# Patient Record
Sex: Female | Born: 1945
Health system: Southern US, Community
[De-identification: ages and names within clinical notes are randomized; demographics above are authoritative.]

## PROBLEM LIST (undated history)

## (undated) DIAGNOSIS — Z86010 Personal history of colonic polyps: Secondary | ICD-10-CM

## (undated) DIAGNOSIS — I517 Cardiomegaly: Secondary | ICD-10-CM

## (undated) DIAGNOSIS — I10 Essential (primary) hypertension: Secondary | ICD-10-CM

## (undated) DIAGNOSIS — D649 Anemia, unspecified: Secondary | ICD-10-CM

## (undated) DIAGNOSIS — I422 Other hypertrophic cardiomyopathy: Secondary | ICD-10-CM

## (undated) DIAGNOSIS — N3020 Other chronic cystitis without hematuria: Secondary | ICD-10-CM

## (undated) DIAGNOSIS — H269 Unspecified cataract: Secondary | ICD-10-CM

## (undated) DIAGNOSIS — K5792 Diverticulitis of intestine, part unspecified, without perforation or abscess without bleeding: Secondary | ICD-10-CM

## (undated) DIAGNOSIS — R011 Cardiac murmur, unspecified: Secondary | ICD-10-CM

## (undated) DIAGNOSIS — E785 Hyperlipidemia, unspecified: Secondary | ICD-10-CM

## (undated) DIAGNOSIS — Z5189 Encounter for other specified aftercare: Secondary | ICD-10-CM

## (undated) DIAGNOSIS — K219 Gastro-esophageal reflux disease without esophagitis: Secondary | ICD-10-CM

## (undated) HISTORY — PX: COLONOSCOPY: SHX174

## (undated) HISTORY — DX: Essential (primary) hypertension: I10

## (undated) HISTORY — DX: Hyperlipidemia, unspecified: E78.5

## (undated) HISTORY — DX: Diverticulitis of intestine, part unspecified, without perforation or abscess without bleeding: K57.92

## (undated) HISTORY — DX: Unspecified cataract: H26.9

## (undated) HISTORY — DX: Anemia, unspecified: D64.9

## (undated) HISTORY — PX: BREAST BIOPSY: SHX20

## (undated) HISTORY — DX: Other chronic cystitis without hematuria: N30.20

## (undated) HISTORY — DX: Personal history of colonic polyps: Z86.010

## (undated) HISTORY — PX: BREAST SURGERY: SHX581

## (undated) HISTORY — PX: BREAST EXCISIONAL BIOPSY: SUR124

## (undated) HISTORY — PX: CATARACT EXTRACTION: SUR2

## (undated) HISTORY — DX: Cardiac murmur, unspecified: R01.1

---

## 1998-01-17 ENCOUNTER — Ambulatory Visit (HOSPITAL_COMMUNITY): Admission: RE | Admit: 1998-01-17 | Discharge: 1998-01-17 | Payer: Self-pay | Admitting: Unknown Physician Specialty

## 1998-03-01 ENCOUNTER — Ambulatory Visit (HOSPITAL_COMMUNITY): Admission: RE | Admit: 1998-03-01 | Discharge: 1998-03-01 | Payer: Self-pay | Admitting: Gastroenterology

## 1999-02-05 ENCOUNTER — Encounter: Payer: Self-pay | Admitting: Unknown Physician Specialty

## 1999-02-05 ENCOUNTER — Ambulatory Visit (HOSPITAL_COMMUNITY): Admission: RE | Admit: 1999-02-05 | Discharge: 1999-02-05 | Payer: Self-pay | Admitting: Unknown Physician Specialty

## 2000-02-21 ENCOUNTER — Encounter: Payer: Self-pay | Admitting: Unknown Physician Specialty

## 2000-02-21 ENCOUNTER — Ambulatory Visit (HOSPITAL_COMMUNITY): Admission: RE | Admit: 2000-02-21 | Discharge: 2000-02-21 | Payer: Self-pay | Admitting: Unknown Physician Specialty

## 2001-03-09 ENCOUNTER — Encounter: Payer: Self-pay | Admitting: Unknown Physician Specialty

## 2001-03-09 ENCOUNTER — Ambulatory Visit (HOSPITAL_COMMUNITY): Admission: RE | Admit: 2001-03-09 | Discharge: 2001-03-09 | Payer: Self-pay | Admitting: Unknown Physician Specialty

## 2002-03-11 ENCOUNTER — Ambulatory Visit (HOSPITAL_COMMUNITY): Admission: RE | Admit: 2002-03-11 | Discharge: 2002-03-11 | Payer: Self-pay | Admitting: Obstetrics and Gynecology

## 2002-03-11 ENCOUNTER — Encounter: Payer: Self-pay | Admitting: Unknown Physician Specialty

## 2003-03-22 ENCOUNTER — Ambulatory Visit (HOSPITAL_COMMUNITY): Admission: RE | Admit: 2003-03-22 | Discharge: 2003-03-22 | Payer: Self-pay | Admitting: Unknown Physician Specialty

## 2003-05-03 ENCOUNTER — Other Ambulatory Visit: Admission: RE | Admit: 2003-05-03 | Discharge: 2003-05-03 | Payer: Self-pay | Admitting: Family Medicine

## 2004-03-22 ENCOUNTER — Ambulatory Visit (HOSPITAL_COMMUNITY): Admission: RE | Admit: 2004-03-22 | Discharge: 2004-03-22 | Payer: Self-pay | Admitting: Family Medicine

## 2005-04-12 ENCOUNTER — Ambulatory Visit (HOSPITAL_COMMUNITY): Admission: RE | Admit: 2005-04-12 | Discharge: 2005-04-12 | Payer: Self-pay | Admitting: Family Medicine

## 2006-05-08 ENCOUNTER — Ambulatory Visit (HOSPITAL_COMMUNITY): Admission: RE | Admit: 2006-05-08 | Discharge: 2006-05-08 | Payer: Self-pay

## 2007-05-11 ENCOUNTER — Ambulatory Visit (HOSPITAL_COMMUNITY): Admission: RE | Admit: 2007-05-11 | Discharge: 2007-05-11 | Payer: Self-pay | Admitting: Family Medicine

## 2008-05-26 ENCOUNTER — Ambulatory Visit (HOSPITAL_COMMUNITY): Admission: RE | Admit: 2008-05-26 | Discharge: 2008-05-26 | Payer: Self-pay | Admitting: Family Medicine

## 2009-04-12 ENCOUNTER — Ambulatory Visit (HOSPITAL_COMMUNITY): Admission: RE | Admit: 2009-04-12 | Discharge: 2009-04-12 | Payer: Self-pay | Admitting: Family Medicine

## 2009-12-08 ENCOUNTER — Ambulatory Visit (HOSPITAL_COMMUNITY): Admission: RE | Admit: 2009-12-08 | Discharge: 2009-12-08 | Payer: Self-pay | Admitting: Family Medicine

## 2011-01-01 ENCOUNTER — Other Ambulatory Visit: Payer: Self-pay | Admitting: Family Medicine

## 2011-01-01 DIAGNOSIS — Z1231 Encounter for screening mammogram for malignant neoplasm of breast: Secondary | ICD-10-CM

## 2011-01-18 ENCOUNTER — Ambulatory Visit (HOSPITAL_COMMUNITY)
Admission: RE | Admit: 2011-01-18 | Discharge: 2011-01-18 | Disposition: A | Discharge status: Home / Self Care | Payer: BC Managed Care – PPO | Source: Ambulatory Visit | Attending: Family Medicine | Admitting: Family Medicine

## 2011-01-18 DIAGNOSIS — Z1231 Encounter for screening mammogram for malignant neoplasm of breast: Secondary | ICD-10-CM

## 2011-10-09 ENCOUNTER — Encounter: Payer: Self-pay | Admitting: Internal Medicine

## 2011-12-05 LAB — LIPID PANEL: HDL: 44 mg/dL (ref 35–70)

## 2011-12-09 ENCOUNTER — Encounter: Payer: BC Managed Care – PPO | Admitting: Internal Medicine

## 2011-12-24 ENCOUNTER — Ambulatory Visit (AMBULATORY_SURGERY_CENTER): Payer: BC Managed Care – PPO

## 2011-12-24 VITALS — Ht 65.5 in | Wt 195.6 lb

## 2011-12-24 DIAGNOSIS — Z1211 Encounter for screening for malignant neoplasm of colon: Secondary | ICD-10-CM

## 2011-12-24 DIAGNOSIS — Z860100 Personal history of colon polyps, unspecified: Secondary | ICD-10-CM

## 2011-12-24 DIAGNOSIS — Z800 Family history of malignant neoplasm of digestive organs: Secondary | ICD-10-CM

## 2011-12-24 MED ORDER — NA SULFATE-K SULFATE-MG SULF 17.5-3.13-1.6 GM/177ML PO SOLN: 1.0000 | Freq: Once | ORAL | Status: DC

## 2011-12-25 ENCOUNTER — Encounter: Payer: Self-pay | Admitting: Internal Medicine

## 2012-01-07 ENCOUNTER — Encounter: Payer: Self-pay | Admitting: Internal Medicine

## 2012-01-07 ENCOUNTER — Ambulatory Visit (AMBULATORY_SURGERY_CENTER): Payer: BC Managed Care – PPO | Admitting: Internal Medicine

## 2012-01-07 VITALS — BP 145/87 | HR 68 | Temp 97.5°F | Resp 21 | Ht 65.0 in | Wt 195.0 lb

## 2012-01-07 DIAGNOSIS — Z860100 Personal history of colon polyps, unspecified: Secondary | ICD-10-CM

## 2012-01-07 DIAGNOSIS — Z1211 Encounter for screening for malignant neoplasm of colon: Secondary | ICD-10-CM

## 2012-01-07 DIAGNOSIS — D126 Benign neoplasm of colon, unspecified: Secondary | ICD-10-CM

## 2012-01-07 DIAGNOSIS — K5730 Diverticulosis of large intestine without perforation or abscess without bleeding: Secondary | ICD-10-CM

## 2012-01-07 HISTORY — DX: Personal history of colon polyps, unspecified: Z86.0100

## 2012-01-07 HISTORY — DX: Personal history of colonic polyps: Z86.010

## 2012-01-07 MED ORDER — SODIUM CHLORIDE 0.9 % IV SOLN: 500.0000 mL | INTRAVENOUS | Status: DC

## 2012-01-07 NOTE — Progress Notes (Signed)
No complaints noted in the recovery room. Maw   

## 2012-01-07 NOTE — Op Note (Signed)
[facility] [address] [place], [address]   COLONOSCOPY PROCEDURE REPORT  PATIENT: Stacey Reyes, Stacey Reyes  MR#: [id number] BIRTHDATE: 1946-03-15 , 65  yrs. old GENDER: Female ENDOSCOPIST: Iva Boop, MD, [facility] REFERRED XB:JYNWGN Christell Constant, M.D. PROCEDURE DATE:  01/07/2012 PROCEDURE:   Colonoscopy with biopsy and Colonoscopy with snare polypectomy ASA CLASS:   Class II INDICATIONS:screening and surveillance,personal history of colonic polyps and patient's personal history of adenomatous colon polyps.  MEDICATIONS: Propofol (Diprivan) 280 mg IV, MAC sedation, administered by CRNA, and These medications were titrated to patient response per physician's verbal order  DESCRIPTION OF PROCEDURE:   After the risks benefits and alternatives of the procedure were thoroughly explained, informed consent was obtained.  A digital rectal exam revealed no abnormalities of the rectum.   The LB CF-[id number]  endoscope was introduced through the anus and advanced to the cecum, which was identified by both the appendix and ileocecal valve. No adverse events experienced.   The quality of the prep was Suprep excellent The instrument was then slowly withdrawn as the colon was fully examined.      COLON FINDINGS: Two polypoid shaped sessile polyps ranging between 3-30mm in size were found at the cecum and in the sigmoid colon.  A polypectomy was performed with cold forceps and with a cold snare. The resection was complete and the polyp tissue was completely retrieved.   Severe diverticulosis was noted The finding was in the left colon.   Mild diverticulosis was noted The finding was in the right colon.   The colon mucosa was otherwise normal.  Retroflexed views revealed no abnormalities. The time to cecum=3 minutes 20 seconds.  Withdrawal time=9 minutes 54 seconds.  The scope was withdrawn and the procedure completed. COMPLICATIONS: There were no complications.  ENDOSCOPIC  IMPRESSION: 1.   Two sessile polyps ranging between 3-66mm in size were found at the cecum and in the sigmoid colon; polypectomy was performed with cold forceps and with a cold snare 2.   Severe diverticulosis was noted in the left colon 3.   Mild diverticulosis was noted in the right colon 4.   The colon mucosa was otherwise normal - excellent prep 5.   Personal history of adenomas 2009 and before  RECOMMENDATIONS: Timing of repeat colonoscopy will be determined by pathology findings. eSigned:  Iva Boop, MD, [facility] 01/07/2012 11:28 AM  cc: Rudi Heap, MD and The Patient

## 2012-01-07 NOTE — Patient Instructions (Addendum)
Two tiny polyps removed today. Diverticulosis seen again. Overall I think this is good news and expect I will recommend a repeat colonoscopy in 5 years in the letter I send.  Thank you for choosing me and [facility].  Iva Boop, MD, [facility]    YOU HAD AN ENDOSCOPIC PROCEDURE TODAY AT THE [facility]: Refer to the procedure report that was given to you for any specific questions about what was found during the examination.  If the procedure report does not answer your questions, please call your gastroenterologist to clarify.  If you requested that your care partner not be given the details of your procedure findings, then the procedure report has been included in a sealed envelope for you to review at your convenience later.  YOU SHOULD EXPECT: Some feelings of bloating in the abdomen. Passage of more gas than usual.  Walking can help get rid of the air that was put into your GI tract during the procedure and reduce the bloating. If you had a lower endoscopy (such as a colonoscopy or flexible sigmoidoscopy) you may notice spotting of blood in your stool or on the toilet paper. If you underwent a bowel prep for your procedure, then you may not have a normal bowel movement for a few days.  DIET: Your first meal following the procedure should be a light meal and then it is ok to progress to your normal diet.  A half-sandwich or bowl of soup is an example of a good first meal.  Heavy or fried foods are harder to digest and may make you feel nauseous or bloated.  Likewise meals heavy in dairy and vegetables can cause extra gas to form and this can also increase the bloating.  Drink plenty of fluids but you should avoid alcoholic beverages for 24 hours.  ACTIVITY: Your care partner should take you home directly after the procedure.  You should plan to take it easy, moving slowly for the rest of the day.  You can resume normal activity the day after the procedure however you  should NOT DRIVE or use heavy machinery for 24 hours (because of the sedation medicines used during the test).    SYMPTOMS TO REPORT IMMEDIATELY: A gastroenterologist can be reached at any hour.  During normal business hours, 8:30 AM to 5:00 PM Monday through Friday, call [phone].  After hours and on weekends, please call the GI answering service at [phone] who will take a message and have the physician on call contact you.   Following lower endoscopy (colonoscopy or flexible sigmoidoscopy):  Excessive amounts of blood in the stool  Significant tenderness or worsening of abdominal pains  Swelling of the abdomen that is new, acute  Fever of 100F or higher   FOLLOW UP: If any biopsies were taken you will be contacted by phone or by letter within the next 1-3 weeks.  Call your gastroenterologist if you have not heard about the biopsies in 3 weeks.  Our staff will call the home number listed on your records the next business day following your procedure to check on you and address any questions or concerns that you may have at that time regarding the information given to you following your procedure. This is a courtesy call and so if there is no answer at the home number and we have not heard from you through the emergency physician on call, we will assume that you have returned to your regular daily activities without incident.  SIGNATURES/CONFIDENTIALITY: You and/or your care partner have signed paperwork which will be entered into your electronic medical record.  These signatures attest to the fact that that the information above on your After Visit Summary has been reviewed and is understood.  Full responsibility of the confidentiality of this discharge information lies with you and/or your care-partner.

## 2012-01-08 ENCOUNTER — Telehealth: Payer: Self-pay

## 2012-01-08 NOTE — Telephone Encounter (Signed)
  Follow up Call-  Call back number 01/07/2012  Post procedure Call Back phone  # [phone]  Permission to leave phone message Yes     Patient questions:  Do you have a fever, pain , or abdominal swelling? no Pain Score  0 *  Have you tolerated food without any problems? yes  Have you been able to return to your normal activities? yes  Do you have any questions about your discharge instructions: Diet   no Medications  no Follow up visit  no  Do you have questions or concerns about your Care? no  Actions: * If pain score is 4 or above: No action needed, pain <4.

## 2012-01-13 ENCOUNTER — Other Ambulatory Visit: Payer: Self-pay | Admitting: Family Medicine

## 2012-01-13 DIAGNOSIS — Z1231 Encounter for screening mammogram for malignant neoplasm of breast: Secondary | ICD-10-CM

## 2012-01-14 ENCOUNTER — Encounter: Payer: Self-pay | Admitting: Internal Medicine

## 2012-01-14 NOTE — Progress Notes (Signed)
Quick Note:  2 diminutive adenomas Repeat colon 2018 ______

## 2012-01-29 ENCOUNTER — Ambulatory Visit (HOSPITAL_COMMUNITY)
Admission: RE | Admit: 2012-01-29 | Discharge: 2012-01-29 | Disposition: A | Discharge status: Home / Self Care | Payer: BC Managed Care – PPO | Source: Ambulatory Visit | Attending: Family Medicine | Admitting: Family Medicine

## 2012-01-29 DIAGNOSIS — Z1231 Encounter for screening mammogram for malignant neoplasm of breast: Secondary | ICD-10-CM | POA: Insufficient documentation

## 2012-06-01 ENCOUNTER — Other Ambulatory Visit: Payer: Self-pay | Admitting: *Deleted

## 2012-06-01 DIAGNOSIS — Z0000 Encounter for general adult medical examination without abnormal findings: Secondary | ICD-10-CM

## 2012-06-01 DIAGNOSIS — L84 Corns and callosities: Secondary | ICD-10-CM

## 2012-06-10 ENCOUNTER — Ambulatory Visit: Payer: Self-pay

## 2012-06-10 ENCOUNTER — Other Ambulatory Visit: Payer: Self-pay

## 2012-06-19 ENCOUNTER — Other Ambulatory Visit: Payer: Self-pay | Admitting: Physician Assistant

## 2012-06-19 NOTE — Telephone Encounter (Signed)
rx up front pt aware

## 2012-06-19 NOTE — Telephone Encounter (Signed)
Print mail order please. NTBS. LAST OFFICE VISIT 11/13.

## 2012-06-19 NOTE — Telephone Encounter (Signed)
RX ready for pick up 

## 2012-09-11 ENCOUNTER — Other Ambulatory Visit: Payer: Self-pay | Admitting: Nurse Practitioner

## 2012-09-14 NOTE — Telephone Encounter (Signed)
LAST OV 11/13. 

## 2012-09-15 NOTE — Telephone Encounter (Signed)
Medication, dosage, and pharmacy verified with pt. Pt does want med and [place] to order.

## 2012-10-26 ENCOUNTER — Ambulatory Visit (INDEPENDENT_AMBULATORY_CARE_PROVIDER_SITE_OTHER): Payer: BC Managed Care – PPO

## 2012-10-26 ENCOUNTER — Encounter: Payer: Self-pay | Admitting: Family Medicine

## 2012-10-26 ENCOUNTER — Ambulatory Visit (INDEPENDENT_AMBULATORY_CARE_PROVIDER_SITE_OTHER): Payer: BC Managed Care – PPO | Admitting: Family Medicine

## 2012-10-26 ENCOUNTER — Telehealth: Payer: Self-pay | Admitting: Nurse Practitioner

## 2012-10-26 VITALS — BP 125/83 | HR 77 | Temp 98.3°F | Wt 199.6 lb

## 2012-10-26 DIAGNOSIS — M25569 Pain in unspecified knee: Secondary | ICD-10-CM

## 2012-10-26 DIAGNOSIS — M25561 Pain in right knee: Secondary | ICD-10-CM

## 2012-10-26 NOTE — Patient Instructions (Signed)
Knee Pain  The knee is the complex joint between your thigh and your lower leg. It is made up of bones, tendons, ligaments, and cartilage. The bones that make up the knee are:   The femur in the thigh.   The tibia and fibula in the lower leg.   The patella or kneecap riding in the groove on the lower femur.  CAUSES   Knee pain is a common complaint with many causes. A few of these causes are:   Injury, such as:   A ruptured ligament or tendon injury.   Torn cartilage.   Medical conditions, such as:   Gout   Arthritis   Infections   Overuse, over training or overdoing a physical activity.  Knee pain can be minor or severe. Knee pain can accompany debilitating injury. Minor knee problems often respond well to self-care measures or get well on their own. More serious injuries may need medical intervention or even surgery.  SYMPTOMS  The knee is complex. Symptoms of knee problems can vary widely. Some of the problems are:   Pain with movement and weight bearing.   Swelling and tenderness.   Buckling of the knee.   Inability to straighten or extend your knee.   Your knee locks and you cannot straighten it.   Warmth and redness with pain and fever.   Deformity or dislocation of the kneecap.  DIAGNOSIS   Determining what is wrong may be very straight forward such as when there is an injury. It can also be challenging because of the complexity of the knee. Tests to make a diagnosis may include:   Your caregiver taking a history and doing a physical exam.   Routine X-rays can be used to rule out other problems. X-rays will not reveal a cartilage tear. Some injuries of the knee can be diagnosed by:   Arthroscopy a surgical technique by which a small video camera is inserted through tiny incisions on the sides of the knee. This procedure is used to examine and repair internal knee joint problems. Tiny instruments can be used during arthroscopy to repair the torn knee cartilage (meniscus).   Arthrography  is a radiology technique. A contrast liquid is directly injected into the knee joint. Internal structures of the knee joint then become visible on X-ray film.   An MRI scan is a non x-ray radiology procedure in which magnetic fields and a computer produce two- or three-dimensional images of the inside of the knee. Cartilage tears are often visible using an MRI scanner. MRI scans have largely replaced arthrography in diagnosing cartilage tears of the knee.   Blood work.   Examination of the fluid that helps to lubricate the knee joint (synovial fluid). This is done by taking a sample out using a needle and a syringe.  TREATMENT  The treatment of knee problems depends on the cause. Some of these treatments are:   Depending on the injury, proper casting, splinting, surgery or physical therapy care will be needed.   Give yourself adequate recovery time. Do not overuse your joints. If you begin to get sore during workout routines, back off. Slow down or do fewer repetitions.   For repetitive activities such as cycling or running, maintain your strength and nutrition.   Alternate muscle groups. For example if you are a weight lifter, work the upper body on one day and the lower body the next.   Either tight or weak muscles do not give the proper support for your   knee. Tight or weak muscles do not absorb the stress placed on the knee joint. Keep the muscles surrounding the knee strong.   Take care of mechanical problems.   If you have flat feet, orthotics or special shoes may help. See your caregiver if you need help.   Arch supports, sometimes with wedges on the inner or outer aspect of the heel, can help. These can shift pressure away from the side of the knee most bothered by osteoarthritis.   A brace called an "unloader" brace also may be used to help ease the pressure on the most arthritic side of the knee.   If your caregiver has prescribed crutches, braces, wraps or ice, use as directed. The acronym for  this is PRICE. This means protection, rest, ice, compression and elevation.   Nonsteroidal anti-inflammatory drugs (NSAID's), can help relieve pain. But if taken immediately after an injury, they may actually increase swelling. Take NSAID's with food in your stomach. Stop them if you develop stomach problems. Do not take these if you have a history of ulcers, stomach pain or bleeding from the bowel. Do not take without your caregiver's approval if you have problems with fluid retention, heart failure, or kidney problems.   For ongoing knee problems, physical therapy may be helpful.   Glucosamine and chondroitin are over-the-counter dietary supplements. Both may help relieve the pain of osteoarthritis in the knee. These medicines are different from the usual anti-inflammatory drugs. Glucosamine may decrease the rate of cartilage destruction.   Injections of a corticosteroid drug into your knee joint may help reduce the symptoms of an arthritis flare-up. They may provide pain relief that lasts a few months. You may have to wait a few months between injections. The injections do have a small increased risk of infection, water retention and elevated blood sugar levels.   Hyaluronic acid injected into damaged joints may ease pain and provide lubrication. These injections may work by reducing inflammation. A series of shots may give relief for as long as 6 months.   Topical painkillers. Applying certain ointments to your skin may help relieve the pain and stiffness of osteoarthritis. Ask your pharmacist for suggestions. Many over the-counter products are approved for temporary relief of arthritis pain.   In some countries, doctors often prescribe topical NSAID's for relief of chronic conditions such as arthritis and tendinitis. A review of treatment with NSAID creams found that they worked as well as oral medications but without the serious side effects.  PREVENTION   Maintain a healthy weight. Extra pounds put  more strain on your joints.   Get strong, stay limber. Weak muscles are a common cause of knee injuries. Stretching is important. Include flexibility exercises in your workouts.   Be smart about exercise. If you have osteoarthritis, chronic knee pain or recurring injuries, you may need to change the way you exercise. This does not mean you have to stop being active. If your knees ache after jogging or playing basketball, consider switching to swimming, water aerobics or other low-impact activities, at least for a few days a week. Sometimes limiting high-impact activities will provide relief.   Make sure your shoes fit well. Choose footwear that is right for your sport.   Protect your knees. Use the proper gear for knee-sensitive activities. Use kneepads when playing volleyball or laying carpet. Buckle your seat belt every time you drive. Most shattered kneecaps occur in car accidents.   Rest when you are tired.  SEEK MEDICAL CARE IF:     You have knee pain that is continual and does not seem to be getting better.   SEEK IMMEDIATE MEDICAL CARE IF:   Your knee joint feels hot to the touch and you have a high fever.  MAKE SURE YOU:    Understand these instructions.   Will watch your condition.   Will get help right away if you are not doing well or get worse.  Document Released: 01/06/2007 Document Revised: 06/03/2011 Document Reviewed: 01/06/2007  [employer] Patient Information 2014 [employer].

## 2012-10-26 NOTE — Telephone Encounter (Signed)
appt made

## 2012-10-26 NOTE — Progress Notes (Signed)
  Subjective:    Patient ID: Stacey Reyes, female    DOB: 1945-03-26, 67 y.o.   MRN: [id number]  HPI This 67 y.o. female presents for evaluation of right knee discomfort.  She states she does A lot of dancing and she has hurt her knee.  She describes pain and discomfort in her pre- Patellar region with some soreness in her quadriceps muscles.   She states it hurts to walk Up stairs or go down stairs..   Review of Systems    No chest pain, SOB, HA, dizziness, vision change, N/V, diarrhea, constipation, dysuria, urinary urgency or frequency, myalgias, arthralgias or rash.  Objective:   Physical Exam Vital signs noted  Well developed well nourished female.  HEENT - Head atraumatic Normocephalic                Throat - oropharanx wnl Respiratory - Lungs CTA bilateral Extremities - No edema. Neuro - Grossly intact. MS - Pre tibial tenderness, from right knee, negative drawer and varus/valgus.   Xray - Preliminary read normal right knee    Assessment & Plan:  Right knee pain - Plan: DG Knee 1-2 Views Right Aleve 200mg  3po tid x one week and if not better follow up.

## 2012-10-31 ENCOUNTER — Ambulatory Visit (INDEPENDENT_AMBULATORY_CARE_PROVIDER_SITE_OTHER): Payer: BC Managed Care – PPO | Admitting: General Practice

## 2012-10-31 VITALS — BP 159/88 | HR 94 | Temp 99.5°F | Ht 66.0 in | Wt 198.0 lb

## 2012-10-31 DIAGNOSIS — J029 Acute pharyngitis, unspecified: Secondary | ICD-10-CM

## 2012-10-31 MED ORDER — AMOXICILLIN 500 MG PO CAPS: 500.0000 mg | ORAL_CAPSULE | Freq: Two times a day (BID) | ORAL | Status: DC

## 2012-10-31 NOTE — Progress Notes (Signed)
  Subjective:    Patient ID: Stacey Reyes, female    DOB: 09/07/1945, 67 y.o.   MRN: [id number]  Sore Throat  This is a new problem. The current episode started yesterday. The problem has been gradually worsening. The pain is worse on the left side. There has been no fever. The pain is at a severity of 5/10. Associated symptoms include ear pain. Pertinent negatives include no abdominal pain, congestion, coughing, diarrhea, headaches, plugged ear sensation, neck pain or shortness of breath. She has tried NSAIDs for the symptoms. The treatment provided no relief.  Otalgia  There is pain in the left ear. This is a new problem. The current episode started yesterday. The problem occurs every few hours. The problem has been gradually worsening. There has been no fever. The pain is at a severity of 4/10. Associated symptoms include a sore throat. Pertinent negatives include no abdominal pain, coughing, diarrhea, headaches or neck pain. She has tried nothing for the symptoms. There is no history of a chronic ear infection, hearing loss or a tympanostomy tube.      Review of Systems  Constitutional: Negative for fever and chills.  HENT: Positive for ear pain and sore throat. Negative for congestion, neck pain and postnasal drip.   Respiratory: Negative for cough and shortness of breath.   Cardiovascular: Negative for chest pain and palpitations.  Gastrointestinal: Negative for abdominal pain and diarrhea.  Neurological: Negative for dizziness, weakness and headaches.       Objective:   Physical Exam  Constitutional: She is oriented to person, place, and time. She appears well-developed and well-nourished.  HENT:  Head: Normocephalic and atraumatic.  Eyes: Conjunctivae and EOM are normal. Pupils are equal, round, and reactive to light.  Neck: Normal range of motion. Neck supple. No thyromegaly present.  Cardiovascular: Normal rate, regular rhythm and normal heart sounds.   Pulmonary/Chest:  Effort normal and breath sounds normal. No respiratory distress. She exhibits no tenderness.  Lymphadenopathy:    She has no cervical adenopathy.  Neurological: She is alert and oriented to person, place, and time.  Skin: Skin is warm and dry.  Psychiatric: She has a normal mood and affect.          Assessment & Plan:  1. Sore throat - amoxicillin (AMOXIL) 500 MG capsule; Take 1 capsule (500 mg total) by mouth 2 (two) times daily.  Dispense: 20 capsule; Refill: 0 -Increase fluid intake Motrin or tylenol OTC OTC decongestant Throat lozenges if help New toothbrush in 3 days Proper handwashing Patient verbalized understanding Coralie Keens, FNP-C

## 2012-10-31 NOTE — Patient Instructions (Addendum)
Sore Throat A sore throat is pain, burning, irritation, or scratchiness of the throat. There is often pain or tenderness when swallowing or talking. A sore throat may be accompanied by other symptoms, such as coughing, sneezing, fever, and swollen neck glands. A sore throat is often the first sign of another sickness, such as a cold, flu, strep throat, or mononucleosis (commonly known as mono). Most sore throats go away without medical treatment. CAUSES  The most common causes of a sore throat include:  A viral infection, such as a cold, flu, or mono.  A bacterial infection, such as strep throat, tonsillitis, or whooping cough.  Seasonal allergies.  Dryness in the air.  Irritants, such as smoke or pollution.  Gastroesophageal reflux disease (GERD). HOME CARE INSTRUCTIONS   Only take over-the-counter medicines as directed by your caregiver.  Drink enough fluids to keep your urine clear or pale yellow.  Rest as needed.  Try using throat sprays, lozenges, or sucking on hard candy to ease any pain (if older than 4 years or as directed).  Sip warm liquids, such as broth, herbal tea, or warm water with honey to relieve pain temporarily. You may also eat or drink cold or frozen liquids such as frozen ice pops.  Gargle with salt water (mix 1 tsp salt with 8 oz of water).  Do not smoke and avoid secondhand smoke.  Put a cool-mist humidifier in your bedroom at night to moisten the air. You can also turn on a hot shower and sit in the bathroom with the door closed for 5 10 minutes. SEEK IMMEDIATE MEDICAL CARE IF:  You have difficulty breathing.  You are unable to swallow fluids, soft foods, or your saliva.  You have increased swelling in the throat.  Your sore throat does not get better in 7 days.  You have nausea and vomiting.  You have a fever or persistent symptoms for more than 2 3 days.  You have a fever and your symptoms suddenly get worse. MAKE SURE YOU:   Understand  these instructions.  Will watch your condition.  Will get help right away if you are not doing well or get worse. Document Released: 04/18/2004 Document Revised: 02/26/2012 Document Reviewed: 11/17/2011 Exit[employer] Patient Information 2014 [employer].  

## 2012-11-30 ENCOUNTER — Other Ambulatory Visit: Payer: Self-pay | Admitting: Family Medicine

## 2013-01-12 ENCOUNTER — Telehealth: Payer: Self-pay | Admitting: Nurse Practitioner

## 2013-01-14 NOTE — Telephone Encounter (Signed)
Patient aware.

## 2013-01-28 ENCOUNTER — Other Ambulatory Visit: Payer: Self-pay

## 2013-04-20 ENCOUNTER — Telehealth: Payer: Self-pay | Admitting: Nurse Practitioner

## 2013-04-20 NOTE — Telephone Encounter (Signed)
Appt given for tomorrow per patient request

## 2013-04-21 ENCOUNTER — Ambulatory Visit (INDEPENDENT_AMBULATORY_CARE_PROVIDER_SITE_OTHER): Payer: BC Managed Care – PPO | Admitting: General Practice

## 2013-04-21 ENCOUNTER — Encounter: Payer: Self-pay | Admitting: General Practice

## 2013-04-21 VITALS — BP 150/92 | HR 81 | Temp 98.9°F | Ht 66.0 in | Wt 200.0 lb

## 2013-04-21 DIAGNOSIS — R05 Cough: Secondary | ICD-10-CM

## 2013-04-21 DIAGNOSIS — R059 Cough, unspecified: Secondary | ICD-10-CM

## 2013-04-21 MED ORDER — PREDNISONE (PAK) 10 MG PO TABS: ORAL_TABLET | ORAL | Status: DC

## 2013-04-21 MED ORDER — GUAIFENESIN-CODEINE 100-10 MG/5ML PO SYRP: 5.0000 mL | ORAL_SOLUTION | Freq: Two times a day (BID) | ORAL | Status: DC | PRN

## 2013-04-21 MED ORDER — METHYLPREDNISOLONE ACETATE 80 MG/ML IJ SUSP
80.0000 mg | Freq: Once | INTRAMUSCULAR | Status: DC
  Administered 2013-04-21: 80 mg via INTRAMUSCULAR

## 2013-04-21 NOTE — Progress Notes (Signed)
   Subjective:    Patient ID: Stacey Reyes, female    DOB: 05/25/1945, 68 y.o.   MRN: [id number]  Cough This is a new problem. The current episode started in the past 7 days. The problem has been gradually worsening. The problem occurs every few minutes. The cough is non-productive. Associated symptoms include nasal congestion. Pertinent negatives include no chest pain, chills, fever, postnasal drip, rhinorrhea, sore throat, shortness of breath or wheezing. The symptoms are aggravated by lying down. She has tried OTC cough suppressant for the symptoms. There is no history of asthma, bronchitis or pneumonia.  Patient reports tessalon capsules haven't been effective in the past.     Review of Systems  Constitutional: Negative for fever and chills.  HENT: Negative for congestion, postnasal drip, rhinorrhea, sinus pressure and sore throat.   Respiratory: Positive for cough. Negative for chest tightness, shortness of breath and wheezing.   Cardiovascular: Negative for chest pain and palpitations.       Objective:   Physical Exam  Constitutional: She is oriented to person, place, and time. She appears well-developed and well-nourished.  Cardiovascular: Normal rate, regular rhythm and normal heart sounds.   Pulmonary/Chest: Effort normal and breath sounds normal. No respiratory distress. She exhibits no tenderness.  Neurological: She is alert and oriented to person, place, and time.  Skin: Skin is warm and dry.  Psychiatric: She has a normal mood and affect.          Assessment & Plan:  1. Cough -depo medrol 80mg  injection - guaiFENesin-codeine (CHERATUSSIN AC) 100-10 MG/5ML syrup; Take 5 mLs by mouth 2 (two) times daily as needed for cough.  Dispense: 60 mL; Refill: 0 -avoid irritants -RTO if symptoms worsen or unresolved (2 weeks) -will take chest xray -Patient verbalized understanding Erby Pian, FNP-C

## 2013-04-21 NOTE — Patient Instructions (Signed)

## 2013-04-27 ENCOUNTER — Ambulatory Visit: Payer: BC Managed Care – PPO | Admitting: Family Medicine

## 2013-04-27 VITALS — BP 131/77 | HR 98 | Temp 99.1°F | Ht 66.0 in | Wt 199.0 lb

## 2013-04-27 DIAGNOSIS — R059 Cough, unspecified: Secondary | ICD-10-CM

## 2013-04-27 DIAGNOSIS — J209 Acute bronchitis, unspecified: Secondary | ICD-10-CM

## 2013-04-27 DIAGNOSIS — R05 Cough: Secondary | ICD-10-CM

## 2013-04-27 MED ORDER — AZITHROMYCIN 250 MG PO TABS: ORAL_TABLET | ORAL | Status: DC

## 2013-04-27 MED ORDER — GUAIFENESIN-CODEINE 100-10 MG/5ML PO SYRP: 5.0000 mL | ORAL_SOLUTION | Freq: Four times a day (QID) | ORAL | Status: DC | PRN

## 2013-04-27 MED ORDER — PREDNISONE 20 MG PO TABS: 20.0000 mg | ORAL_TABLET | Freq: Every day | ORAL | Status: DC

## 2013-04-27 MED ORDER — LEVALBUTEROL HCL 1.25 MG/0.5ML IN NEBU: 1.2500 mg | INHALATION_SOLUTION | Freq: Once | RESPIRATORY_TRACT | Status: DC

## 2013-04-27 NOTE — Progress Notes (Signed)
   Subjective:    Patient ID: [name], female    DOB: 09/08/1945, 68 y.o.   MRN: [id number]  HPI This 68 y.o. female presents for evaluation of cough and bronchitis sx's for over a week.   Review of Systems C/o cough No chest pain, SOB, HA, dizziness, vision change, N/V, diarrhea, constipation, dysuria, urinary urgency or frequency, myalgias, arthralgias or rash.     Objective:   Physical Exam Vital signs noted  Well developed well nourished female.  HEENT - Head atraumatic Normocephalic                Eyes - PERRLA, Conjuctiva - clear Sclera- Clear EOMI                Ears - EAC's Wnl TM's Wnl Gross Hearing WNL                Throat - oropharanx wnl Respiratory - Lungs with expiratory wheezes Cardiac - RRR S1 and S2 without murmur GI - Abdomen soft Nontender and bowel sounds active x 4        Assessment & Plan:  Cough - Plan: guaiFENesin-codeine (CHERATUSSIN AC) 100-10 MG/5ML syrup  Acute bronchitis - Plan: guaiFENesin-codeine (CHERATUSSIN AC) 100-10 MG/5ML syrup, azithromycin (ZITHROMAX) 250 MG tablet, predniSONE (DELTASONE) 20 MG tablet, levalbuterol (XOPENEX) nebulizer solution 1.25 mg  Push po fluids, rest, tylenol and motrin otc prn as directed for fever, arthralgias, and myalgias.  Follow up prn if sx's continue or persist.  Lysbeth Penner FNP

## 2013-05-20 ENCOUNTER — Other Ambulatory Visit: Payer: Self-pay | Admitting: General Practice

## 2013-05-21 ENCOUNTER — Telehealth: Payer: Self-pay | Admitting: *Deleted

## 2013-05-21 NOTE — Telephone Encounter (Signed)
Script sent in and left a vm saying patient needs an appointment to be see and have labwork.

## 2013-05-21 NOTE — Telephone Encounter (Signed)
ntbs for follow up and lab work

## 2013-07-01 ENCOUNTER — Other Ambulatory Visit: Payer: Self-pay | Admitting: Family Medicine

## 2013-07-01 DIAGNOSIS — Z1231 Encounter for screening mammogram for malignant neoplasm of breast: Secondary | ICD-10-CM

## 2013-07-02 ENCOUNTER — Ambulatory Visit (HOSPITAL_COMMUNITY)
Admission: RE | Admit: 2013-07-02 | Discharge: 2013-07-02 | Disposition: A | Discharge status: Home / Self Care | Payer: BC Managed Care – PPO | Source: Ambulatory Visit | Attending: Family Medicine | Admitting: Family Medicine

## 2013-07-02 DIAGNOSIS — Z1231 Encounter for screening mammogram for malignant neoplasm of breast: Secondary | ICD-10-CM

## 2013-08-06 ENCOUNTER — Other Ambulatory Visit: Payer: Self-pay | Admitting: Nurse Practitioner

## 2013-08-09 NOTE — Telephone Encounter (Signed)
Patient NTBS for follow up and lab work  

## 2013-08-09 NOTE — Telephone Encounter (Signed)
Last labs 2013 in epic

## 2013-08-30 ENCOUNTER — Ambulatory Visit (INDEPENDENT_AMBULATORY_CARE_PROVIDER_SITE_OTHER): Payer: BC Managed Care – PPO | Admitting: Family Medicine

## 2013-08-30 ENCOUNTER — Encounter: Payer: Self-pay | Admitting: Family Medicine

## 2013-08-30 VITALS — BP 128/69 | HR 88 | Temp 99.4°F | Ht 66.0 in | Wt 198.8 lb

## 2013-08-30 DIAGNOSIS — K5732 Diverticulitis of large intestine without perforation or abscess without bleeding: Secondary | ICD-10-CM

## 2013-08-30 DIAGNOSIS — K5792 Diverticulitis of intestine, part unspecified, without perforation or abscess without bleeding: Secondary | ICD-10-CM

## 2013-08-30 MED ORDER — CIPROFLOXACIN HCL 500 MG PO TABS: 500.0000 mg | ORAL_TABLET | Freq: Two times a day (BID) | ORAL | Status: DC

## 2013-08-30 MED ORDER — METRONIDAZOLE 500 MG PO TABS: 500.0000 mg | ORAL_TABLET | Freq: Three times a day (TID) | ORAL | Status: DC

## 2013-08-30 NOTE — Progress Notes (Signed)
   Subjective:    Patient ID: Stacey Reyes, female    DOB: May 23, 1945, 68 y.o.   MRN: [id number]  HPI This 68 y.o. female presents for evaluation of LLQ abdominal pain for 2 days.  She states she ate  Some strawberry jam and she has hx of diverticulosis.   Review of Systems C/o abdominal pain   No chest pain, SOB, HA, dizziness, vision change, N/V, diarrhea, constipation, dysuria, urinary urgency or frequency, myalgias, arthralgias or rash.  Objective:   Physical Exam  Vital signs noted  Well developed well nourished female.  HEENT - Head atraumatic Normocephalic                Eyes - PERRLA, Conjuctiva - clear Sclera- Clear EOMI Respiratory - Lungs CTA bilateral Cardiac - RRR S1 and S2 without murmur GI - Abdomen soft tender LLQ without guarding BS active x 4      Assessment & Plan:  Diverticulitis - Plan: metroNIDAZOLE (FLAGYL) 500 MG tablet, ciprofloxacin (CIPRO) 500 MG tablet, DISCONTINUED: metroNIDAZOLE (FLAGYL) 500 MG tablet, DISCONTINUED: ciprofloxacin (CIPRO) 500 MG tablet  Lysbeth Penner FNP

## 2013-11-01 ENCOUNTER — Other Ambulatory Visit: Payer: Self-pay | Admitting: Nurse Practitioner

## 2013-11-02 NOTE — Telephone Encounter (Signed)
Last ov 08/30/13. I did confirm with pt that she wants RX sent to [facility].

## 2014-01-25 ENCOUNTER — Other Ambulatory Visit: Payer: Self-pay

## 2014-01-25 MED ORDER — LISINOPRIL-HYDROCHLOROTHIAZIDE 20-12.5 MG PO TABS: ORAL_TABLET | ORAL | Status: DC

## 2014-04-13 ENCOUNTER — Other Ambulatory Visit: Payer: Self-pay | Admitting: Family Medicine

## 2014-08-24 ENCOUNTER — Other Ambulatory Visit: Payer: Self-pay | Admitting: Family Medicine

## 2014-10-22 ENCOUNTER — Ambulatory Visit (INDEPENDENT_AMBULATORY_CARE_PROVIDER_SITE_OTHER): Payer: BLUE CROSS/BLUE SHIELD | Admitting: Family Medicine

## 2014-10-22 VITALS — BP 141/85 | HR 76 | Temp 98.4°F | Ht 66.0 in | Wt 202.0 lb

## 2014-10-22 DIAGNOSIS — R05 Cough: Secondary | ICD-10-CM

## 2014-10-22 DIAGNOSIS — R059 Cough, unspecified: Secondary | ICD-10-CM

## 2014-10-22 DIAGNOSIS — J200 Acute bronchitis due to Mycoplasma pneumoniae: Secondary | ICD-10-CM

## 2014-10-22 MED ORDER — LEVOFLOXACIN 500 MG PO TABS: 500.0000 mg | ORAL_TABLET | Freq: Every day | ORAL | Status: DC

## 2014-10-22 MED ORDER — GUAIFENESIN-CODEINE 100-10 MG/5ML PO SYRP: 5.0000 mL | ORAL_SOLUTION | Freq: Four times a day (QID) | ORAL | Status: DC | PRN

## 2014-10-22 NOTE — Progress Notes (Signed)
Subjective:  Patient ID: Stacey Reyes, female    DOB: May 03, 1945  Age: 69 y.o. MRN: [id number]  CC: Cough   HPI Stacey Reyes presents for  Onset 1 week ago. Worsening. OTC cough meds ineffective. Some nasal congestion. ST is minimal. No fever. No  Dyspnea. Location in chest, anteriorly - feels tight. History Stacey Reyes has a past medical history of Hypertension; Hyperlipidemia; Personal history of colonic polyps-adenomas (01/07/2012); Chronic cystitis; and Insomnia.   She has past surgical history that includes Breast surgery and Colonoscopy (multiple).   Her family history includes Breast cancer in her sister; Colon cancer (age of onset: 79) in her mother.She reports that she quit smoking about 29 years ago. She has never used smokeless tobacco. She reports that she does not drink alcohol or use illicit drugs.  Outpatient Prescriptions Prior to Visit  Medication Sig Dispense Refill  . aspirin 81 MG tablet Take 81 mg by mouth daily.    . Cholecalciferol (VITAMIN D-3) 1000 UNITS CAPS Take by mouth daily.    . fish oil-omega-3 fatty acids 1000 MG capsule Take 1 g by mouth daily.    Marland Kitchen lisinopril-hydrochlorothiazide (PRINZIDE,ZESTORETIC) 20-12.5 MG per tablet TAKE 1 TABLET DAILY 30 tablet 0  . simvastatin (ZOCOR) 40 MG tablet Take 40 mg by mouth every evening.    . vitamin E 400 UNIT capsule Take 400 Units by mouth. Take 2 daily    . ciprofloxacin (CIPRO) 500 MG tablet Take 1 tablet (500 mg total) by mouth 2 (two) times daily. 20 tablet 0  . metroNIDAZOLE (FLAGYL) 500 MG tablet Take 1 tablet (500 mg total) by mouth 3 (three) times daily. 21 tablet 0   Facility-Administered Medications Prior to Visit  Medication Dose Route Frequency Provider Last Rate Last Dose  . levalbuterol (XOPENEX) nebulizer solution 1.25 mg  1.25 mg Nebulization Once Lysbeth Penner, FNP        ROS Review of Systems  Constitutional: Negative for fever, chills, activity change and appetite change.  HENT:  Positive for congestion, postnasal drip, rhinorrhea and sinus pressure. Negative for ear discharge, ear pain, hearing loss, nosebleeds, sneezing and trouble swallowing.   Respiratory: Positive for cough and chest tightness. Negative for shortness of breath.   Cardiovascular: Negative for chest pain and palpitations.  Skin: Negative for rash.    Objective:  BP 141/85 mmHg  Pulse 76  Temp(Src) 98.4 F (36.9 C) (Oral)  Ht 5\' 6"  (1.676 m)  Wt 202 lb (91.627 kg)  BMI 32.62 kg/m2  BP Readings from Last 3 Encounters:  10/22/14 141/85  08/30/13 128/69  04/27/13 131/77    Wt Readings from Last 3 Encounters:  10/22/14 202 lb (91.627 kg)  08/30/13 198 lb 12.8 oz (90.175 kg)  04/27/13 199 lb (90.266 kg)     Physical Exam  Constitutional: She appears well-developed and well-nourished.  HENT:  Head: Normocephalic and atraumatic.  Right Ear: Tympanic membrane and external ear normal. No decreased hearing is noted.  Left Ear: Tympanic membrane and external ear normal. No decreased hearing is noted.  Nose: Mucosal edema present. Right sinus exhibits no frontal sinus tenderness. Left sinus exhibits no frontal sinus tenderness.  Mouth/Throat: No oropharyngeal exudate or posterior oropharyngeal erythema.  Neck: No Brudzinski's sign noted.  Pulmonary/Chest: Breath sounds normal. No respiratory distress.  Lymphadenopathy:       Head (right side): No preauricular adenopathy present.       Head (left side): No preauricular adenopathy present.  Right cervical: No superficial cervical adenopathy present.      Left cervical: No superficial cervical adenopathy present.    No results found for: HGBA1C  Lab Results  Component Value Date   CHOL 185 12/05/2011   TRIG 187* 12/05/2011   HDL 44 12/05/2011   LDLCALC 110 12/05/2011   NA 137 12/05/2011   K 4.1 12/05/2011   TSH 0.90 12/05/2011    Mm Screening Breast Tomo Bilateral  07/05/2013   CLINICAL DATA:  Screening.  EXAM: DIGITAL  SCREENING BILATERAL MAMMOGRAM WITH 3D TOMO WITH CAD  COMPARISON:  Previous exam(s)  ACR Breast Density Category a: The breast tissue is almost entirely fatty.  FINDINGS: There are no findings suspicious for malignancy. Images were processed with CAD.  IMPRESSION: No mammographic evidence of malignancy. A result letter of this screening mammogram will be mailed directly to the patient.  RECOMMENDATION: Screening mammogram in one year. (Code:SM-B-01Y)  BI-RADS CATEGORY  1: Negative.   Electronically Signed   By: Curlene Dolphin M.D.   On: 07/05/2013 08:55    Assessment & Plan:   Nyisha was seen today for cough.  Diagnoses and all orders for this visit:  Cough Orders: -     guaiFENesin-codeine (CHERATUSSIN AC) 100-10 MG/5ML syrup; Take 5 mLs by mouth 4 (four) times daily as needed for cough.  Acute bronchitis due to Mycoplasma pneumoniae Orders: -     guaiFENesin-codeine (CHERATUSSIN AC) 100-10 MG/5ML syrup; Take 5 mLs by mouth 4 (four) times daily as needed for cough.  Other orders -     levofloxacin (LEVAQUIN) 500 MG tablet; Take 1 tablet (500 mg total) by mouth daily.   I have discontinued Stacey Reyes metroNIDAZOLE and ciprofloxacin. I am also having her start on levofloxacin. Additionally, I am having her maintain her simvastatin, Vitamin D-3, aspirin, vitamin E, fish oil-omega-3 fatty acids, lisinopril-hydrochlorothiazide, and guaiFENesin-codeine. We will continue to administer levalbuterol.  Meds ordered this encounter  Medications  . levofloxacin (LEVAQUIN) 500 MG tablet    Sig: Take 1 tablet (500 mg total) by mouth daily.    Dispense:  7 tablet    Refill:  0  . guaiFENesin-codeine (CHERATUSSIN AC) 100-10 MG/5ML syrup    Sig: Take 5 mLs by mouth 4 (four) times daily as needed for cough.    Dispense:  240 mL    Refill:  0     Follow-up: Return if symptoms worsen or fail to improve.  Claretta Fraise, M.D.

## 2014-11-01 ENCOUNTER — Other Ambulatory Visit: Payer: Self-pay | Admitting: Family Medicine

## 2014-11-01 DIAGNOSIS — Z1231 Encounter for screening mammogram for malignant neoplasm of breast: Secondary | ICD-10-CM

## 2014-11-08 ENCOUNTER — Ambulatory Visit (HOSPITAL_COMMUNITY)
Admission: RE | Admit: 2014-11-08 | Discharge: 2014-11-08 | Disposition: A | Discharge status: Home / Self Care | Payer: BLUE CROSS/BLUE SHIELD | Source: Ambulatory Visit | Attending: Family Medicine | Admitting: Family Medicine

## 2014-11-08 DIAGNOSIS — Z1231 Encounter for screening mammogram for malignant neoplasm of breast: Secondary | ICD-10-CM

## 2015-02-23 ENCOUNTER — Ambulatory Visit
Admission: RE | Admit: 2015-02-23 | Discharge: 2015-02-23 | Disposition: A | Discharge status: Home / Self Care | Payer: BLUE CROSS/BLUE SHIELD | Source: Ambulatory Visit | Attending: Family Medicine | Admitting: Family Medicine

## 2015-02-23 ENCOUNTER — Encounter: Payer: Self-pay | Admitting: Family Medicine

## 2015-02-23 ENCOUNTER — Ambulatory Visit (INDEPENDENT_AMBULATORY_CARE_PROVIDER_SITE_OTHER): Payer: BLUE CROSS/BLUE SHIELD | Admitting: Family Medicine

## 2015-02-23 VITALS — BP 131/107 | HR 107 | Temp 97.5°F | Ht 66.0 in | Wt 202.0 lb

## 2015-02-23 DIAGNOSIS — R2689 Other abnormalities of gait and mobility: Secondary | ICD-10-CM

## 2015-02-23 DIAGNOSIS — R29818 Other symptoms and signs involving the nervous system: Secondary | ICD-10-CM

## 2015-02-23 DIAGNOSIS — I358 Other nonrheumatic aortic valve disorders: Secondary | ICD-10-CM

## 2015-02-23 MED ORDER — ONDANSETRON 4 MG PO TBDP: 4.0000 mg | ORAL_TABLET | Freq: Three times a day (TID) | ORAL | Status: DC | PRN

## 2015-02-23 NOTE — Progress Notes (Signed)
BP 131/107 mmHg  Pulse 107  Temp(Src) 97.5 F (36.4 C) (Oral)  Ht 5\' 6"  (1.676 m)  Wt 202 lb (91.627 kg)  BMI 32.62 kg/m2   Subjective:    Patient ID: Stacey Reyes, female    DOB: 15-Jan-1946, 69 y.o.   MRN: [id number]  HPI: Stacey Reyes is a 69 y.o. female presenting on 02/23/2015 for Dizziness   HPI Balance issues and slurred speech Patient presents today with a coworker from her office who brought her here after she was having some balance issues and difficulty walking and slurred speech that all began this morning around 11:30 right after eating lunch. They noticed that she was walking consistently towards the left and she complained of dizziness as well. Her coworker who is here with her said she was having very slowed and slurred speech. His never had this before. She denied any headaches along with it. She denied any numbness or weakness anywhere else. She denied any blurred vision or hearing changes that she noticed. She is still having some residual balance issue but the dizziness is much improved. She denied any spinning sensation or feeling lightheaded or presyncopal. Her coworker could not tell for sure whether or not the left side of her face might be a little more droopy than it has been before.  Relevant past medical, surgical, family and social history reviewed and updated as indicated. Interim medical history since our last visit reviewed. Allergies and medications reviewed and updated.  Review of Systems  Constitutional: Negative for fever and chills.  HENT: Negative for congestion, ear discharge and ear pain.   Eyes: Negative for discharge, redness and visual disturbance.  Respiratory: Negative for chest tightness and shortness of breath.   Cardiovascular: Negative for chest pain and leg swelling.  Genitourinary: Negative for dysuria, flank pain and difficulty urinating.  Musculoskeletal: Negative for back pain and gait problem.  Skin: Negative for rash.    Neurological: Positive for dizziness and speech difficulty. Negative for syncope, weakness, light-headedness, numbness and headaches.  Psychiatric/Behavioral: Negative for behavioral problems and agitation.  All other systems reviewed and are negative.   Per HPI unless specifically indicated above     Medication List       This list is accurate as of: 02/23/15  3:40 PM.  Always use your most recent med list.               aspirin 81 MG tablet  Take 81 mg by mouth daily.     fish oil-omega-3 fatty acids 1000 MG capsule  Take 1 g by mouth daily.     lisinopril-hydrochlorothiazide 20-12.5 MG tablet  Commonly known as:  PRINZIDE,ZESTORETIC  TAKE 1 TABLET DAILY     simvastatin 40 MG tablet  Commonly known as:  ZOCOR  Take 40 mg by mouth every evening.     Vitamin D-3 1000 UNITS Caps  Take by mouth daily.     vitamin E 400 UNIT capsule  Take 400 Units by mouth. Take 2 daily           Objective:    BP 131/107 mmHg  Pulse 107  Temp(Src) 97.5 F (36.4 C) (Oral)  Ht 5\' 6"  (1.676 m)  Wt 202 lb (91.627 kg)  BMI 32.62 kg/m2  Wt Readings from Last 3 Encounters:  02/23/15 202 lb (91.627 kg)  10/22/14 202 lb (91.627 kg)  08/30/13 198 lb 12.8 oz (90.175 kg)    Physical Exam  Constitutional: She is oriented to  person, place, and time. She appears well-developed and well-nourished. No distress.  HENT:  Right Ear: External ear normal.  Left Ear: External ear normal.  Nose: Nose normal.  Mouth/Throat: Oropharynx is clear and moist. No oropharyngeal exudate.  Eyes: Conjunctivae and EOM are normal. Pupils are equal, round, and reactive to light.  Neck: Neck supple. No thyromegaly present.  Cardiovascular: Normal rate, regular rhythm, normal heart sounds and intact distal pulses.   No murmur heard. Pulmonary/Chest: Effort normal and breath sounds normal. No respiratory distress. She has no wheezes.  Musculoskeletal: Normal range of motion. She exhibits no edema or  tenderness.  Lymphadenopathy:    She has no cervical adenopathy.  Neurological: She is alert and oriented to person, place, and time. She has normal strength and normal reflexes. She displays no atrophy and no tremor. No cranial nerve deficit or sensory deficit. She exhibits normal muscle tone. She displays no seizure activity. Coordination normal.  Skin: Skin is warm and dry. No rash noted. She is not diaphoretic.  Psychiatric: She has a normal mood and affect. Her behavior is normal.  Nursing note and vitals reviewed.   Results for orders placed or performed in visit on 06/08/12  HM PAP SMEAR  Result Value Ref Range   HM Pap smear normal   Basic metabolic panel  Result Value Ref Range   Potassium 4.1 3.4 - 5.3 mmol/L   Sodium 137 137 - 147 mmol/L  Lipid panel  Result Value Ref Range   Triglycerides 187 (A) 40 - 160 mg/dL   Cholesterol 185 0 - 200 mg/dL   HDL 44 35 - 70 mg/dL   LDL Cholesterol 110 mg/dL  TSH  Result Value Ref Range   TSH 0.90 0.41 - 5.90 uIU/mL      Assessment & Plan:   Problem List Items Addressed This Visit    None    Visit Diagnoses    Balance problem    -  Primary    new balance and speech issue    Relevant Orders    MR Brain Wo Contrast    Aortic systolic murmur on examination        Relevant Orders    Echocardiogram        Follow up plan: Return in about 4 weeks (around 03/23/2015), or if symptoms worsen or fail to improve, for f/u dizziness.  Counseling provided for all of the vaccine components Orders Placed This Encounter  Procedures  . MR Brain Wo Contrast  . Echocardiogram    Caryl Pina, MD [facility] 02/23/2015, 3:40 PM

## 2015-02-24 ENCOUNTER — Ambulatory Visit (INDEPENDENT_AMBULATORY_CARE_PROVIDER_SITE_OTHER): Payer: BLUE CROSS/BLUE SHIELD | Admitting: Pediatrics

## 2015-02-24 ENCOUNTER — Other Ambulatory Visit: Payer: Self-pay | Admitting: Nurse Practitioner

## 2015-02-24 ENCOUNTER — Encounter: Payer: Self-pay | Admitting: Pediatrics

## 2015-02-24 ENCOUNTER — Ambulatory Visit: Payer: BLUE CROSS/BLUE SHIELD

## 2015-02-24 VITALS — BP 127/72 | HR 80 | Temp 97.2°F | Ht 66.0 in | Wt 204.8 lb

## 2015-02-24 DIAGNOSIS — Z6827 Body mass index (BMI) 27.0-27.9, adult: Secondary | ICD-10-CM

## 2015-02-24 DIAGNOSIS — R29818 Other symptoms and signs involving the nervous system: Secondary | ICD-10-CM | POA: Diagnosis not present

## 2015-02-24 DIAGNOSIS — R52 Pain, unspecified: Secondary | ICD-10-CM

## 2015-02-24 DIAGNOSIS — Z0000 Encounter for general adult medical examination without abnormal findings: Secondary | ICD-10-CM

## 2015-02-24 DIAGNOSIS — I10 Essential (primary) hypertension: Secondary | ICD-10-CM | POA: Insufficient documentation

## 2015-02-24 DIAGNOSIS — E785 Hyperlipidemia, unspecified: Secondary | ICD-10-CM | POA: Diagnosis not present

## 2015-02-24 DIAGNOSIS — R8281 Pyuria: Secondary | ICD-10-CM

## 2015-02-24 DIAGNOSIS — E782 Mixed hyperlipidemia: Secondary | ICD-10-CM | POA: Insufficient documentation

## 2015-02-24 DIAGNOSIS — R002 Palpitations: Secondary | ICD-10-CM | POA: Diagnosis not present

## 2015-02-24 DIAGNOSIS — Z13820 Encounter for screening for osteoporosis: Secondary | ICD-10-CM | POA: Diagnosis not present

## 2015-02-24 DIAGNOSIS — N390 Urinary tract infection, site not specified: Secondary | ICD-10-CM

## 2015-02-24 LAB — POCT UA - MICROSCOPIC ONLY
CASTS, UR, LPF, POC: NEGATIVE
CRYSTALS, UR, HPF, POC: NEGATIVE
Mucus, UA: NEGATIVE
YEAST UA: NEGATIVE

## 2015-02-24 LAB — POCT URINALYSIS DIPSTICK
Bilirubin, UA: NEGATIVE
GLUCOSE UA: NEGATIVE
Ketones, UA: NEGATIVE
NITRITE UA: NEGATIVE
PH UA: 7
Protein, UA: NEGATIVE
RBC UA: NEGATIVE
SPEC GRAV UA: 1.01
UROBILINOGEN UA: NEGATIVE

## 2015-02-24 LAB — POCT GLYCOSYLATED HEMOGLOBIN (HGB A1C): HEMOGLOBIN A1C: 5.6

## 2015-02-24 NOTE — Progress Notes (Signed)
Subjective:    Patient ID: Stacey Reyes, female    DOB: 05/19/1945, 69 y.o.   MRN: [id number]  CC: Annual Exam and episode of lightheadedness  HPI: Stacey Reyes is a 69 y.o. female presenting for Annual Exam and follow up of multiple complaints  Had episode of slurred speech per her office mates that pt isnt sure that she slurred her speech At the same she was vereing to the L, told again by office mates at [employer] that she was walking funny BP at the time was 132/86 Yesterday morning felt like her heart was going to beat out of her chest Took a very low dose of xanax in the morning yesterday because of the heart beating so hard She felt like it was beating fast and that it was going to "beat out of her chest" Lots of stress at work [employer] some activity in every day Stopped sweet tea completely Occasional caffeine free mountain dew No chest pain or SOB with acitvities, does something active every day No history of abnormal pap smears UTD on mammograms  No constipation, normal bowel habits  Former smoker, none recently Sister dx at 38yo with breast ca  Depression screen [facility] 2/9 02/24/2015 02/23/2015 08/30/2013  Decreased Interest 0 0 0  Down, Depressed, Hopeless 0 0 0  PHQ - 2 Score 0 0 0    ROS: All systems negative other than what is in HPI  History  Smoking status  . Former Smoker  . Quit date: 12/23/1984  Smokeless tobacco  . Never Used    Past Medical History Patient Active Problem List   Diagnosis Date Noted  . Personal history of colonic polyps-adenomas 01/07/2012   Allergies (verified) Sulfur   Family History  Problem Relation Age of Onset  . Colon cancer Mother 49    80's  . Breast cancer Sister    Social History   Occupational History  . Works at [employer] in [place]  . Smoking status: Former Smoker    Quit date: 12/23/1984  . Smokeless tobacco: Never Used  . Alcohol Use: No    . Drug Use: No  . Sexual Activity: Sexually active    Do you feel safe at home?  Yes  Dietary issues and exercise activities: very active, regularly dancing, walking, does something active daily   Current Dietary habits:  Varied, fruits and vegetables, too many sweets    Current Outpatient Prescriptions  Medication Sig Dispense Refill  . aspirin 81 MG tablet Take 81 mg by mouth daily.    . Cholecalciferol (VITAMIN D-3) 1000 UNITS CAPS Take by mouth daily.    . fish oil-omega-3 fatty acids 1000 MG capsule Take 1 g by mouth daily.    Marland Kitchen lisinopril-hydrochlorothiazide (PRINZIDE,ZESTORETIC) 20-12.5 MG per tablet TAKE 1 TABLET DAILY 30 tablet 0  . ondansetron (ZOFRAN ODT) 4 MG disintegrating tablet Take 1 tablet (4 mg total) by mouth every 8 (eight) hours as needed for nausea or vomiting. 20 tablet 0  . simvastatin (ZOCOR) 40 MG tablet Take 40 mg by mouth every evening.    . vitamin E 400 UNIT capsule Take 400 Units by mouth. Take 2 daily     Current Facility-Administered Medications  Medication Dose Route Frequency Provider Last Rate Last Dose  . levalbuterol (XOPENEX) nebulizer solution 1.25 mg  1.25 mg Nebulization Once Lysbeth Penner, FNP           Objective:  BP 127/72 mmHg  Pulse 80  Temp(Src) 97.2 F (36.2 C) (Oral)  Ht 5' 6"  (1.676 m)  Wt 204 lb 12.8 oz (92.897 kg)  BMI 33.07 kg/m2  Wt Readings from Last 3 Encounters:  02/24/15 204 lb 12.8 oz (92.897 kg)  02/23/15 202 lb (91.627 kg)  10/22/14 202 lb (91.627 kg)    Gen: NAD, alert, cooperative with exam, NCAT EYES: EOMI, no scleral injection or icterus ENT:  TMs pearly gray b/l, OP without erythema LYMPH: no cervical LAD CV: NRRR, normal S1/S2, no murmur, distal pulses 2+ b/l Resp: CTABL, no wheezes, normal WOB Abd: +BS, soft, NTND. no guarding or organomegaly Ext: No edema, warm Neuro: Alert and oriented, CN III-XII intact, neg romberg, normal cerebellar function, normal strength equal b/l UE and LE, normal  gait, sensation intact UE, LE, normal rapid alternating movements, strength equal UE, LE B/l MSK: normal muscle bulk Breast: normal exam b/l Skin: small light brown macules present across back, breasts, shoulders consistent with sun changes. Two small flexh colored soft skin tags R flank.   Activities of Daily Living In your present state of health, do you have any difficulty performing the following activities: 02/24/2015  Hearing? N  Vision? N  Difficulty concentrating or making decisions? N  Walking or climbing stairs? N  Dressing or bathing? N  Doing errands, shopping? N    Are there smokers in your home (other than you)? No   Cognitive Function: MMSE 29  Immunizations and Health Maintenance Immunization History  Administered Date(s) Administered  . Pneumococcal Conjugate-13 02/27/2010  . Pneumococcal Polysaccharide-23 11/08/2013  . Tdap 01/29/2012   Health Maintenance Due  Topic Date Due  . Hepatitis C Screening  1945-10-06  . ZOSTAVAX  01/30/2005  . DEXA SCAN  01/30/2010   Fall Risk Fall Risk  02/24/2015 02/23/2015 08/30/2013  Falls in the past year? No No No    Patient Care Team: Claretta Fraise, MD as PCP - General (Family Medicine)  Screening Tests Health Maintenance  Topic Date Due  . Hepatitis C Screening  [birth year]  . ZOSTAVAX  01/30/2005  . DEXA SCAN  01/30/2010  . INFLUENZA VACCINE  10/24/2015  . MAMMOGRAM  11/07/2016  . COLONOSCOPY  01/06/2017  . TETANUS/TDAP  01/28/2022  . PNA vac Low Risk Adult  Completed   EKG today: normal HR, NSR, with slightly widened QRS, q-waves present in leads III, aVF with poor R wave progression.     Patient Care Team: Claretta Fraise, MD as PCP - General (Family Medicine)        Assessment and Plan:   Stacey Reyes was seen today for annual exam, episode of abnormal gait with palpitations f/u.   Diagnoses and all orders for this visit:  Encounter for preventive health examination -     BMP8+EGFR -     POCT  glycosylated hemoglobin (Hb A1C) -     POCT urinalysis dipstick -     POCT UA - Microscopic Only -     Thyroid Panel With TSH -     CBC -     DG Bone Density; Future -     Hepatitis C antibody  Heart palpitations and Focal neurological deficit Seen in clinic two days ago for the same. No further symptoms since first episode but pt still very concerned about the cause. MRI completed that day was negative, has an order in for ECHO but has not yet been completed. Heart palpitations started first, she took a xanax that was several  years old, then had the "walking sideways per my office mates", possibly slurred speech. Recent labs at work with cholesterol panel as below, fasting glucose of 102. Will send TSH, HgA1c as above to eval for risk factors, also urinalysis for occult infection, CBC. Pt with normal neuro exam today. Ddx includes TIA for which we will eval and treat risk factors, on ASA 57m and statin, occult infection, xanax effect, pt to let me know if she needs any further xanax for flying phobia which is what it was originally prescribed for. Ideally would minimize any further use. She is to throw away old medicine. If heart palpitations return she should have holter monitor. EKG today with normal HR, NSR, with slightly widened QRS, q-waves present in leads III, aVF with poor R wave progression. No history of afib or MI. -     EKG 12-Lead  Osteoporosis screening -     DG Bone Density; Future  Essential hypertension Well controlled today. Will get BMP today for monitoring. Continue current medications  Hyperlipidemia Continue simvastatin.  BMI 27 Continue active lifestyle, minimize sweets  During the course of the visit MSebastianwas educated and counseled about the following appropriate screening and preventive services:   Vaccines to include none today, declined zostavax  Colorectal cancer screening--UTD  Cardiovascular disease screening--Total cholesterol 171, HDL 37, LDL 96 on  01/09/2015 at work. On simvastatin  Diabetes screening--HgA1c today  Bone Denisty / Osteoporosis Screening--has had one at some point, not within last few years. Was told to take vitamin D and calcium afterwards. Will order now.  Mammogram--UTD  PAP--pt with multiple negative pap smears within ten years of turning 69yo, after discussion with pt will stop screening pap smears now per guidelines  Nutrition counseling completed  Advanced directives    CEustaquio Maize MD   02/24/2015    ADDENDUM: pyuria on UA, will treat with nitrofurantoin, sending culture.

## 2015-02-25 ENCOUNTER — Other Ambulatory Visit: Payer: BLUE CROSS/BLUE SHIELD

## 2015-02-25 DIAGNOSIS — R8281 Pyuria: Secondary | ICD-10-CM

## 2015-02-25 LAB — BMP8+EGFR
BUN/Creatinine Ratio: 13 (ref 11–26)
BUN: 8 mg/dL (ref 8–27)
CO2: 25 mmol/L (ref 18–29)
Calcium: 9.4 mg/dL (ref 8.7–10.3)
Chloride: 95 mmol/L — ABNORMAL LOW (ref 97–106)
Creatinine, Ser: 0.61 mg/dL (ref 0.57–1.00)
GFR calc Af Amer: 106 mL/min/{1.73_m2}
GFR calc non Af Amer: 92 mL/min/{1.73_m2}
Glucose: 93 mg/dL (ref 65–99)
Potassium: 4.1 mmol/L (ref 3.5–5.2)
Sodium: 136 mmol/L (ref 136–144)

## 2015-02-25 LAB — CBC
Hematocrit: 38.2 % (ref 34.0–46.6)
Hemoglobin: 13.3 g/dL (ref 11.1–15.9)
MCH: 30.2 pg (ref 26.6–33.0)
MCHC: 34.8 g/dL (ref 31.5–35.7)
MCV: 87 fL (ref 79–97)
Platelets: 329 10*3/uL (ref 150–379)
RBC: 4.4 x10E6/uL (ref 3.77–5.28)
RDW: 13.1 % (ref 12.3–15.4)
WBC: 8.8 10*3/uL (ref 3.4–10.8)

## 2015-02-25 LAB — THYROID PANEL WITH TSH
FREE THYROXINE INDEX: 2.4 (ref 1.2–4.9)
T3 Uptake Ratio: 27 % (ref 24–39)
T4, Total: 9 ug/dL (ref 4.5–12.0)
TSH: 1.43 u[IU]/mL (ref 0.450–4.500)

## 2015-02-25 LAB — HEPATITIS C ANTIBODY: HEP C VIRUS AB: 0.1 {s_co_ratio} (ref 0.0–0.9)

## 2015-02-25 MED ORDER — NITROFURANTOIN MONOHYD MACRO 100 MG PO CAPS: 100.0000 mg | ORAL_CAPSULE | Freq: Two times a day (BID) | ORAL | Status: DC

## 2015-02-27 LAB — URINE CULTURE: Organism ID, Bacteria: NO GROWTH

## 2015-03-08 ENCOUNTER — Other Ambulatory Visit (HOSPITAL_COMMUNITY): Payer: BLUE CROSS/BLUE SHIELD

## 2015-03-21 ENCOUNTER — Telehealth: Payer: Self-pay | Admitting: Pediatrics

## 2015-03-21 NOTE — Telephone Encounter (Signed)
Patient aware that you are out of the office today and she states that if you agree she would like to have this week since she has met her deductible.

## 2015-03-22 NOTE — Telephone Encounter (Signed)
Patient called back stating that she is still having back after bladder infection has cleared up. Patient also states that she has met her deductible for this year and was wanting to have MRI done by the end of this week.  Informed patient that she will need to be seen before a MRI can be ordered and so she could talk with the doctor about symptoms she is having.  Patient verbalized understanding.

## 2015-03-22 NOTE — Telephone Encounter (Signed)
Pt had an MRI of her brain after an episode of slurred speech to see if was a mini stroke or not 4 weeks ago. She has not had an MRI of her neck, and we didn't talk about neck or back symptoms at her appt with me 4 weeks ago during that visit. She would need to be seen to talk about symptoms.

## 2015-07-17 DIAGNOSIS — R7309 Other abnormal glucose: Secondary | ICD-10-CM | POA: Diagnosis not present

## 2015-07-24 DIAGNOSIS — R7309 Other abnormal glucose: Secondary | ICD-10-CM | POA: Diagnosis not present

## 2015-08-09 DIAGNOSIS — J069 Acute upper respiratory infection, unspecified: Secondary | ICD-10-CM | POA: Diagnosis not present

## 2015-08-28 DIAGNOSIS — I10 Essential (primary) hypertension: Secondary | ICD-10-CM | POA: Diagnosis not present

## 2015-09-18 DIAGNOSIS — R5383 Other fatigue: Secondary | ICD-10-CM | POA: Diagnosis not present

## 2015-09-18 DIAGNOSIS — R490 Dysphonia: Secondary | ICD-10-CM | POA: Diagnosis not present

## 2015-09-20 DIAGNOSIS — Z09 Encounter for follow-up examination after completed treatment for conditions other than malignant neoplasm: Secondary | ICD-10-CM | POA: Diagnosis not present

## 2015-09-20 DIAGNOSIS — R5383 Other fatigue: Secondary | ICD-10-CM | POA: Diagnosis not present

## 2015-10-09 ENCOUNTER — Other Ambulatory Visit: Payer: Self-pay | Admitting: *Deleted

## 2015-10-09 DIAGNOSIS — I499 Cardiac arrhythmia, unspecified: Secondary | ICD-10-CM

## 2015-10-09 DIAGNOSIS — R002 Palpitations: Secondary | ICD-10-CM | POA: Diagnosis not present

## 2015-10-11 DIAGNOSIS — I499 Cardiac arrhythmia, unspecified: Secondary | ICD-10-CM | POA: Insufficient documentation

## 2015-10-11 NOTE — Progress Notes (Signed)
Cardiology Office Note    Date:  10/12/2015   ID:  Melisssa, Stricklin Jul 01, 1945, MRN [id number]  PCP:  Claretta Fraise, MD  Cardiologist: Sinclair Grooms, MD   Chief Complaint  Patient presents with  . Palpitations    New consult    History of Present Illness:  Stacey Reyes is a 70 y.o. female seen in consultation for Dr. Artemio Aly because of recent palpitations and near syncope.  Very pleasant 70 year old female who still works as an Scientist, water quality at [employer]. She is in for evaluation because of a 2 to three-month history of palpitations that feel as though her heart is racing. She had one episode within the past 7 days where she also had slight dimming of vision that was momentary. She has never had syncope. Episodes of palpitation lasts seconds. Occurrences random. She denies chest discomfort and history of heart disease.  2 abnormal EKGs were now available including one from 2016 and today's EKG both of which revealed poor R-wave progression raising the question of anterior infarct. Absolutely no symptoms are clinical history compatible with prior infarction.  Prior smoker greater than 30 years ago. Hyperlipidemia. Hypertension. Both hyperlipidemia and hypertension of been aggressively treated over time.    Past Medical History  Diagnosis Date  . Hypertension   . Hyperlipidemia   . Personal history of colonic polyps-adenomas 01/07/2012    2009 - 2 diminutive adenomas (prior polyps also) 01/07/2012 - 2 diminutive adenomas    . Chronic cystitis   . Insomnia     Past Surgical History  Procedure Laterality Date  . Breast surgery      breast biopsy/ right /benign  . Colonoscopy  multiple    Current Medications: Outpatient Prescriptions Prior to Visit  Medication Sig Dispense Refill  . aspirin 81 MG tablet Take 81 mg by mouth daily.    . Cholecalciferol (VITAMIN D-3) 1000 UNITS CAPS Take by mouth daily.    . fish oil-omega-3 fatty acids 1000 MG  capsule Take 1 g by mouth daily.    Marland Kitchen lisinopril-hydrochlorothiazide (PRINZIDE,ZESTORETIC) 20-12.5 MG per tablet TAKE 1 TABLET DAILY 30 tablet 0  . simvastatin (ZOCOR) 40 MG tablet Take 40 mg by mouth every evening.    . vitamin E 400 UNIT capsule Take 400 Units by mouth. Take 2 daily    . nitrofurantoin, macrocrystal-monohydrate, (MACROBID) 100 MG capsule Take 1 capsule (100 mg total) by mouth 2 (two) times daily. 1 po BId (Patient not taking: Reported on 10/12/2015) 14 capsule 0  . ondansetron (ZOFRAN ODT) 4 MG disintegrating tablet Take 1 tablet (4 mg total) by mouth every 8 (eight) hours as needed for nausea or vomiting. (Patient not taking: Reported on 10/12/2015) 20 tablet 0   Facility-Administered Medications Prior to Visit  Medication Dose Route Frequency Provider Last Rate Last Dose  . levalbuterol (XOPENEX) nebulizer solution 1.25 mg  1.25 mg Nebulization Once Lysbeth Penner, FNP         Allergies:   Sulfur   Social History   Social History  . Marital Status: Married    Spouse Name: N/A  . Number of Children: N/A  . Years of Education: N/A   Social History Main Topics  . Smoking status: Former Smoker    Quit date: 12/23/1984  . Smokeless tobacco: Never Used  . Alcohol Use: No  . Drug Use: No  . Sexual Activity: Not Asked   Other Topics Concern  . None   Social History  Narrative     Family History:  The patient's family history includes Breast cancer in her sister; Colon cancer (age of onset: 29) in her mother.   ROS:   Please see the history of present illness.    Cough, nausea, excessive fatigue.  All other systems reviewed and are negative.   PHYSICAL EXAM:   VS:  BP 138/80 mmHg  Pulse 88  Ht 5\' 6"  (1.676 m)  Wt 195 lb 3.2 oz (88.542 kg)  BMI 31.52 kg/m2   GEN: Well nourished, well developed, in no acute distress HEENT: normal Neck: no JVD, carotid bruits, or masses Cardiac: RRR. There is 2/6 systolic murmur. There is no rub or gallops, or edema    Respiratory:  clear to auscultation bilaterally, normal work of breathing GI: soft, nontender, nondistended, + BS MS: no deformity or atrophy Skin: warm and dry, no rash Neuro:  Alert and Oriented x 3, Strength and sensation are intact Psych: euthymic mood, full affect  Wt Readings from Last 3 Encounters:  10/12/15 195 lb 3.2 oz (88.542 kg)  02/24/15 204 lb 12.8 oz (92.897 kg)  02/23/15 202 lb (91.627 kg)      Studies/Labs Reviewed:   EKG:  EKG  Normal sinus rhythm with poor wave progression V1 through V4. When compared to prior tracings no significant change. Inferior Q wave in lead III.  Recent Labs: 02/24/2015: BUN 8; Creatinine, Ser 0.61; Platelets 329; Potassium 4.1; Sodium 136; TSH 1.430   Lipid Panel    Component Value Date/Time   CHOL 185 12/05/2011   TRIG 187* 12/05/2011   HDL 44 12/05/2011   LDLCALC 110 12/05/2011    Additional studies/ records that were reviewed today include:  Reviewed EKGs are present on chart    ASSESSMENT:    1. Palpitations   2. Near syncope   3. Abnormal EKG   4. Systolic murmur   5. Essential hypertension   6. Hyperlipidemia      PLAN:  In order of problems listed above:  1. 30 day monitor will be done to exclude malignant arrhythmia/A. Fib/bradycardia. 2. See above and below. 3. Echocardiogram will be performed to rule out anterior wall motion abnormality. This will also evaluate the source of the soft systolic murmur that is heard on exam. 4. 2-D Doppler echocardiogram 5. Low salt diet and continue current medical regimen 6. Continue regimen    Medication Adjustments/Labs and Tests Ordered: Current medicines are reviewed at length with the patient today.  Concerns regarding medicines are outlined above.  Medication changes, Labs and Tests ordered today are listed in the Patient Instructions below. Patient Instructions  Medication Instructions:  Your physician recommends that you continue on your current medications as  directed. Please refer to the Current Medication list given to you today.   Labwork: None ordered  Testing/Procedures: Your physician has recommended that you wear an event monitor. Event monitors are medical devices that record the heart's electrical activity. Doctors most often [place] these monitors to diagnose arrhythmias. Arrhythmias are problems with the speed or rhythm of the heartbeat. The monitor is a small, portable device. You can wear one while you do your normal daily activities. This is usually used to diagnose what is causing palpitations/syncope (passing out).  Your physician has requested that you have an echocardiogram. Echocardiography is a painless test that uses sound waves to create images of your heart. It provides your doctor with information about the size and shape of your heart and how well your heart's chambers  and valves are working. This procedure takes approximately one hour. There are no restrictions for this procedure.    Follow-Up: y  Any Other Special Instructions Will Be Listed Below (If Applicable).     If you need a refill on your cardiac medications before your next appointment, please call your pharmacy.       Signed, Sinclair Grooms, MD  10/12/2015 12:26 PM    [address], [place], [place]  [address] Phone: [phone]; Fax: [phone]

## 2015-10-12 ENCOUNTER — Encounter (INDEPENDENT_AMBULATORY_CARE_PROVIDER_SITE_OTHER): Payer: Self-pay

## 2015-10-12 ENCOUNTER — Ambulatory Visit (INDEPENDENT_AMBULATORY_CARE_PROVIDER_SITE_OTHER): Payer: BLUE CROSS/BLUE SHIELD | Admitting: Interventional Cardiology

## 2015-10-12 ENCOUNTER — Encounter: Payer: Self-pay | Admitting: Interventional Cardiology

## 2015-10-12 VITALS — BP 138/80 | HR 88 | Ht 66.0 in | Wt 195.2 lb

## 2015-10-12 DIAGNOSIS — R9431 Abnormal electrocardiogram [ECG] [EKG]: Secondary | ICD-10-CM

## 2015-10-12 DIAGNOSIS — R002 Palpitations: Secondary | ICD-10-CM

## 2015-10-12 DIAGNOSIS — R55 Syncope and collapse: Secondary | ICD-10-CM

## 2015-10-12 DIAGNOSIS — R011 Cardiac murmur, unspecified: Secondary | ICD-10-CM

## 2015-10-12 DIAGNOSIS — E785 Hyperlipidemia, unspecified: Secondary | ICD-10-CM

## 2015-10-12 DIAGNOSIS — I10 Essential (primary) hypertension: Secondary | ICD-10-CM | POA: Diagnosis not present

## 2015-10-12 NOTE — Patient Instructions (Addendum)
Medication Instructions:  Your physician recommends that you continue on your current medications as directed. Please refer to the Current Medication list given to you today.   Labwork: None ordered  Testing/Procedures: Your physician has recommended that you wear an event monitor. Event monitors are medical devices that record the heart's electrical activity. Doctors most often [place] these monitors to diagnose arrhythmias. Arrhythmias are problems with the speed or rhythm of the heartbeat. The monitor is a small, portable device. You can wear one while you do your normal daily activities. This is usually used to diagnose what is causing palpitations/syncope (passing out).  Your physician has requested that you have an echocardiogram. Echocardiography is a painless test that uses sound waves to create images of your heart. It provides your doctor with information about the size and shape of your heart and how well your heart's chambers and valves are working. This procedure takes approximately one hour. There are no restrictions for this procedure.    Follow-Up: You have an appointment scheduled with Dr.Smith on 11/21/15 @ 9am  Any Other Special Instructions Will Be Listed Below (If Applicable).     If you need a refill on your cardiac medications before your next appointment, please call your pharmacy.

## 2015-10-13 ENCOUNTER — Other Ambulatory Visit: Payer: Self-pay | Admitting: Family Medicine

## 2015-10-13 DIAGNOSIS — Z1231 Encounter for screening mammogram for malignant neoplasm of breast: Secondary | ICD-10-CM

## 2015-10-17 ENCOUNTER — Other Ambulatory Visit (HOSPITAL_COMMUNITY): Payer: Self-pay

## 2015-10-17 ENCOUNTER — Ambulatory Visit (INDEPENDENT_AMBULATORY_CARE_PROVIDER_SITE_OTHER): Payer: BLUE CROSS/BLUE SHIELD

## 2015-10-17 ENCOUNTER — Ambulatory Visit (HOSPITAL_COMMUNITY): Payer: BLUE CROSS/BLUE SHIELD | Attending: Cardiology

## 2015-10-17 DIAGNOSIS — R55 Syncope and collapse: Secondary | ICD-10-CM

## 2015-10-17 DIAGNOSIS — I358 Other nonrheumatic aortic valve disorders: Secondary | ICD-10-CM | POA: Insufficient documentation

## 2015-10-17 DIAGNOSIS — E785 Hyperlipidemia, unspecified: Secondary | ICD-10-CM | POA: Insufficient documentation

## 2015-10-17 DIAGNOSIS — R002 Palpitations: Secondary | ICD-10-CM

## 2015-10-17 DIAGNOSIS — I119 Hypertensive heart disease without heart failure: Secondary | ICD-10-CM | POA: Diagnosis not present

## 2015-10-18 ENCOUNTER — Telehealth: Payer: Self-pay

## 2015-10-18 NOTE — Telephone Encounter (Signed)
Pt aware of echo results and Dr.Smith's recommendation. Pt is is agreement with Dr.Smith's care plan. Adv her that we will contact her after her cardiac monitor results become available to start Metoprolol and schedule her f/u appt. Pt verbalized understanding.

## 2015-10-18 NOTE — Telephone Encounter (Signed)
-----   Message from Belva Crome, MD sent at 10/17/2015  3:34 PM EDT ----- Let the patient know the echo demonstrates moderate increased thickness and the heart muscle. The study is otherwise normal. This implies poor blood pressure control. I recommend we add metoprolol succinate 25 mg daily. This should not be started until after completion of the 30 day monitor. The beta blocker will likely help suppress the palpitations that she has. She will need follow-up in 2-3 weeks after starting metoprolol succinate. Left another we will still contact her concerning results of the 30 day monitor. A copy will be sent to [facility], MD

## 2015-10-27 ENCOUNTER — Telehealth: Payer: Self-pay | Admitting: Interventional Cardiology

## 2015-10-30 DIAGNOSIS — E785 Hyperlipidemia, unspecified: Secondary | ICD-10-CM | POA: Diagnosis not present

## 2015-11-01 DIAGNOSIS — E785 Hyperlipidemia, unspecified: Secondary | ICD-10-CM | POA: Diagnosis not present

## 2015-11-06 DIAGNOSIS — E785 Hyperlipidemia, unspecified: Secondary | ICD-10-CM | POA: Diagnosis not present

## 2015-11-20 DIAGNOSIS — I517 Cardiomegaly: Secondary | ICD-10-CM | POA: Insufficient documentation

## 2015-11-21 ENCOUNTER — Encounter: Payer: Self-pay | Admitting: Interventional Cardiology

## 2015-11-21 ENCOUNTER — Ambulatory Visit (INDEPENDENT_AMBULATORY_CARE_PROVIDER_SITE_OTHER): Payer: BLUE CROSS/BLUE SHIELD | Admitting: Interventional Cardiology

## 2015-11-21 VITALS — BP 166/86 | HR 75 | Ht 66.0 in | Wt 197.4 lb

## 2015-11-21 DIAGNOSIS — E785 Hyperlipidemia, unspecified: Secondary | ICD-10-CM

## 2015-11-21 DIAGNOSIS — I498 Other specified cardiac arrhythmias: Secondary | ICD-10-CM

## 2015-11-21 DIAGNOSIS — I10 Essential (primary) hypertension: Secondary | ICD-10-CM

## 2015-11-21 DIAGNOSIS — I517 Cardiomegaly: Secondary | ICD-10-CM | POA: Diagnosis not present

## 2015-11-21 NOTE — Progress Notes (Signed)
Cardiology Office Note    Date:  11/21/2015   ID:  Stacey, Reyes [birth year], MRN [id number]  PCP:  Stacey Fraise, MD  Cardiologist: Stacey Grooms, MD   Chief Complaint  Patient presents with  . Palpitations    History of Present Illness:  Stacey Reyes is a 70 y.o. female follow-up of palpitations and near syncope while driving. The episode lasted less than 10 seconds.  She has been asymptomatic since development of the above-noted symptoms. Cardiac evaluation including 30 day monitor and echocardiogram. Her EKG does demonstrate mild first-degree AV block.   Past Medical History:  Diagnosis Date  . Chronic cystitis   . Hyperlipidemia   . Hypertension   . Insomnia   . Personal history of colonic polyps-adenomas 01/07/2012   2009 - 2 diminutive adenomas (prior polyps also) 01/07/2012 - 2 diminutive adenomas      Past Surgical History:  Procedure Laterality Date  . BREAST SURGERY     breast biopsy/ right /benign  . COLONOSCOPY  multiple    Current Medications: Outpatient Medications Prior to Visit  Medication Sig Dispense Refill  . aspirin 81 MG tablet Take 81 mg by mouth daily.    . Cholecalciferol (VITAMIN D-3) 1000 UNITS CAPS Take by mouth daily.    . fish oil-omega-3 fatty acids 1000 MG capsule Take 1 g by mouth daily.    Marland Kitchen lisinopril-hydrochlorothiazide (PRINZIDE,ZESTORETIC) 20-12.5 MG per tablet TAKE 1 TABLET DAILY 30 tablet 0  . simvastatin (ZOCOR) 40 MG tablet Take 40 mg by mouth every evening.    . vitamin E 400 UNIT capsule Take 400 Units by mouth. Take 2 daily     Facility-Administered Medications Prior to Visit  Medication Dose Route Frequency Provider Last Rate Last Dose  . levalbuterol (XOPENEX) nebulizer solution 1.25 mg  1.25 mg Nebulization Once Lysbeth Penner, FNP         Allergies:   Sulfur   Social History   Social History  . Marital status: Married    Spouse name: N/A  . Number of children: N/A  . Years of education:  N/A   Social History Main Topics  . Smoking status: Former Smoker    Quit date: 12/23/1984  . Smokeless tobacco: Never Used  . Alcohol use No  . Drug use: No  . Sexual activity: Not Asked   Other Topics Concern  . None   Social History Narrative  . None     Family History:  The patient's family history includes Breast cancer in her sister; Colon cancer (age of onset: 15) in her mother.   ROS:   Please see the history of present illness.    None  All other systems reviewed and are negative.   PHYSICAL EXAM:   VS:  BP (!) 166/86   Pulse 75   Ht 5\' 6"  (1.676 m)   Wt 197 lb 6.4 oz (89.5 kg)   BMI 31.86 kg/m    GEN: Well nourished, well developed, in no acute distress  HEENT: normal  Neck: no JVD, carotid bruits, or masses Cardiac: RRR; no murmurs, rubs, or gallops,no edema  Respiratory:  clear to auscultation bilaterally, normal work of breathing GI: soft, nontender, nondistended, + BS MS: no deformity or atrophy  Skin: warm and dry, no rash Neuro:  Alert and Oriented x 3, Strength and sensation are intact Psych: euthymic mood, full affect  Wt Readings from Last 3 Encounters:  11/21/15 197 lb 6.4 oz (89.5 kg)  10/12/15 195 lb 3.2 oz (88.5 kg)  02/24/15 204 lb 12.8 oz (92.9 kg)      Studies/Labs Reviewed:   EKG:  EKGNormal sinus rhythm, first degree AV block at 202 ms. Poor R-wave progression. No change compared to prior.  Recent Labs: 02/24/2015: BUN 8; Creatinine, Ser 0.61; Platelets 329; Potassium 4.1; Sodium 136; TSH 1.430   Lipid Panel    Component Value Date/Time   CHOL 185 12/05/2011   TRIG 187 (A) 12/05/2011   HDL 44 12/05/2011   LDLCALC 110 12/05/2011    Additional studies/ records that were reviewed today include:  Echocardiogram: Study Conclusions  - Left ventricle: The cavity size was normal. There was moderate   concentric hypertrophy. Systolic function was vigorous. The   estimated ejection fraction was in the range of 65% to 70%. Wall    motion was normal; there were no regional wall motion   abnormalities. Doppler parameters are consistent with abnormal   left ventricular relaxation (grade 1 diastolic dysfunction). - Aortic valve: Trileaflet; mildly thickened, mildly calcified   leaflets.  30 day monitor: Study Highlights     Normal sinus rhythm  No arrhythmia, either tachy or brady  No abnormality to correlate with symptom   Normal      ASSESSMENT:    1. Other cardiac arrhythmia   2. Essential hypertension   3. Hyperlipidemia   4. LVH (left ventricular hypertrophy)      PLAN:  In order of problems listed above:  1. Suspect a supraventricular arrhythmia, possibly PSVT or atrial fib/flutter. None detected by 30 day monitor. She and I debated whether to do further evaluation. We agreed upon the prudent decision to monitor clinically. If she has any, even brief, recurrence of similar symptom we will place a loop recorder. 2. Monitor closely. Low salt diet is recommended. 3. Addressed 4. I addressed the finding of hypertrophy possibly representing poor blood pressure control. She will intensify monitoring at home. Suggested beta blocker therapy but she was resistant.  Plan is clinical observation with follow-up in one year. Call if recurrence of near syncope her prolonged palpitations.    Medication Adjustments/Labs and Tests Ordered: Current medicines are reviewed at length with the patient today.  Concerns regarding medicines are outlined above.  Medication changes, Labs and Tests ordered today are listed in the Patient Instructions below. There are no Patient Instructions on file for this visit.   Signed, Stacey Grooms, MD  11/21/2015 9:29 AM    [facility] [address], [place], [place]  [address] Phone: [phone]; Fax: [phone]

## 2015-11-21 NOTE — Patient Instructions (Signed)
Medication Instructions:  Your physician recommends that you continue on your current medications as directed. Please refer to the Current Medication list given to you today.   Labwork: None   Testing/Procedures: none  Follow-Up: Your physician wants you to follow-up in: 1 year with Dr Tamala [name]. (August 2018). You will receive a reminder letter in the mail two months in advance. If you don't receive a letter, please call our office to schedule the follow-up appointment.        If you need a refill on your cardiac medications before your next appointment, please call your pharmacy.

## 2015-12-01 ENCOUNTER — Ambulatory Visit: Payer: BLUE CROSS/BLUE SHIELD

## 2015-12-13 ENCOUNTER — Ambulatory Visit
Admission: RE | Admit: 2015-12-13 | Discharge: 2015-12-13 | Disposition: A | Discharge status: Home / Self Care | Payer: BLUE CROSS/BLUE SHIELD | Source: Ambulatory Visit | Attending: Family Medicine | Admitting: Family Medicine

## 2015-12-13 DIAGNOSIS — Z1231 Encounter for screening mammogram for malignant neoplasm of breast: Secondary | ICD-10-CM

## 2016-01-01 DIAGNOSIS — Z23 Encounter for immunization: Secondary | ICD-10-CM | POA: Diagnosis not present

## 2016-01-02 DIAGNOSIS — H25012 Cortical age-related cataract, left eye: Secondary | ICD-10-CM | POA: Diagnosis not present

## 2016-01-02 DIAGNOSIS — H35033 Hypertensive retinopathy, bilateral: Secondary | ICD-10-CM | POA: Diagnosis not present

## 2016-01-02 DIAGNOSIS — H524 Presbyopia: Secondary | ICD-10-CM | POA: Diagnosis not present

## 2016-01-02 DIAGNOSIS — H35371 Puckering of macula, right eye: Secondary | ICD-10-CM | POA: Diagnosis not present

## 2016-01-02 DIAGNOSIS — Z961 Presence of intraocular lens: Secondary | ICD-10-CM | POA: Diagnosis not present

## 2016-01-02 DIAGNOSIS — H2512 Age-related nuclear cataract, left eye: Secondary | ICD-10-CM | POA: Diagnosis not present

## 2016-02-28 DIAGNOSIS — H25012 Cortical age-related cataract, left eye: Secondary | ICD-10-CM | POA: Diagnosis not present

## 2016-02-28 DIAGNOSIS — H2512 Age-related nuclear cataract, left eye: Secondary | ICD-10-CM | POA: Diagnosis not present

## 2016-02-28 DIAGNOSIS — H25812 Combined forms of age-related cataract, left eye: Secondary | ICD-10-CM | POA: Diagnosis not present

## 2016-03-11 DIAGNOSIS — E669 Obesity, unspecified: Secondary | ICD-10-CM | POA: Diagnosis not present

## 2016-03-11 DIAGNOSIS — I10 Essential (primary) hypertension: Secondary | ICD-10-CM | POA: Diagnosis not present

## 2016-03-11 DIAGNOSIS — E785 Hyperlipidemia, unspecified: Secondary | ICD-10-CM | POA: Diagnosis not present

## 2016-04-29 DIAGNOSIS — N390 Urinary tract infection, site not specified: Secondary | ICD-10-CM | POA: Diagnosis not present

## 2016-04-29 DIAGNOSIS — I10 Essential (primary) hypertension: Secondary | ICD-10-CM | POA: Diagnosis not present

## 2016-04-29 DIAGNOSIS — Z008 Encounter for other general examination: Secondary | ICD-10-CM | POA: Diagnosis not present

## 2016-04-29 DIAGNOSIS — E785 Hyperlipidemia, unspecified: Secondary | ICD-10-CM | POA: Diagnosis not present

## 2016-05-29 DIAGNOSIS — L821 Other seborrheic keratosis: Secondary | ICD-10-CM | POA: Diagnosis not present

## 2016-05-29 DIAGNOSIS — D1801 Hemangioma of skin and subcutaneous tissue: Secondary | ICD-10-CM | POA: Diagnosis not present

## 2016-05-29 DIAGNOSIS — L814 Other melanin hyperpigmentation: Secondary | ICD-10-CM | POA: Diagnosis not present

## 2016-05-29 DIAGNOSIS — L853 Xerosis cutis: Secondary | ICD-10-CM | POA: Diagnosis not present

## 2016-07-15 DIAGNOSIS — Z760 Encounter for issue of repeat prescription: Secondary | ICD-10-CM | POA: Diagnosis not present

## 2016-08-01 ENCOUNTER — Ambulatory Visit (INDEPENDENT_AMBULATORY_CARE_PROVIDER_SITE_OTHER): Payer: BLUE CROSS/BLUE SHIELD | Admitting: *Deleted

## 2016-08-01 DIAGNOSIS — Z23 Encounter for immunization: Secondary | ICD-10-CM | POA: Diagnosis not present

## 2016-08-01 NOTE — Progress Notes (Signed)
Pt given Shingrix vaccine Tolerated well 

## 2016-08-05 DIAGNOSIS — M79606 Pain in leg, unspecified: Secondary | ICD-10-CM | POA: Diagnosis not present

## 2016-08-07 DIAGNOSIS — M79606 Pain in leg, unspecified: Secondary | ICD-10-CM | POA: Diagnosis not present

## 2016-09-30 DIAGNOSIS — J069 Acute upper respiratory infection, unspecified: Secondary | ICD-10-CM | POA: Diagnosis not present

## 2016-10-02 DIAGNOSIS — J069 Acute upper respiratory infection, unspecified: Secondary | ICD-10-CM | POA: Diagnosis not present

## 2016-10-07 ENCOUNTER — Telehealth: Payer: Self-pay | Admitting: Family Medicine

## 2016-10-07 NOTE — Telephone Encounter (Signed)
Due 10/01/16- 02/01/17

## 2016-10-31 ENCOUNTER — Ambulatory Visit (INDEPENDENT_AMBULATORY_CARE_PROVIDER_SITE_OTHER): Payer: BLUE CROSS/BLUE SHIELD | Admitting: *Deleted

## 2016-10-31 DIAGNOSIS — Z23 Encounter for immunization: Secondary | ICD-10-CM

## 2016-10-31 NOTE — Progress Notes (Signed)
Pt given Shingrix #2 vaccine Tolerated well

## 2016-12-03 DIAGNOSIS — D485 Neoplasm of uncertain behavior of skin: Secondary | ICD-10-CM | POA: Diagnosis not present

## 2016-12-03 DIAGNOSIS — L570 Actinic keratosis: Secondary | ICD-10-CM | POA: Diagnosis not present

## 2016-12-03 DIAGNOSIS — D2239 Melanocytic nevi of other parts of face: Secondary | ICD-10-CM | POA: Diagnosis not present

## 2016-12-03 DIAGNOSIS — L609 Nail disorder, unspecified: Secondary | ICD-10-CM | POA: Diagnosis not present

## 2016-12-03 DIAGNOSIS — L603 Nail dystrophy: Secondary | ICD-10-CM | POA: Diagnosis not present

## 2016-12-07 ENCOUNTER — Ambulatory Visit (INDEPENDENT_AMBULATORY_CARE_PROVIDER_SITE_OTHER): Payer: BLUE CROSS/BLUE SHIELD | Admitting: Pediatrics

## 2016-12-07 VITALS — BP 110/66 | HR 92 | Temp 99.5°F | Ht 66.0 in | Wt 197.0 lb

## 2016-12-07 DIAGNOSIS — K5792 Diverticulitis of intestine, part unspecified, without perforation or abscess without bleeding: Secondary | ICD-10-CM | POA: Diagnosis not present

## 2016-12-07 DIAGNOSIS — R1030 Lower abdominal pain, unspecified: Secondary | ICD-10-CM

## 2016-12-07 MED ORDER — CIPROFLOXACIN HCL 500 MG PO TABS: 500.0000 mg | ORAL_TABLET | Freq: Two times a day (BID) | ORAL | 0 refills | Status: DC

## 2016-12-07 MED ORDER — METRONIDAZOLE 500 MG PO TABS: 500.0000 mg | ORAL_TABLET | Freq: Three times a day (TID) | ORAL | 0 refills | Status: DC

## 2016-12-07 NOTE — Progress Notes (Addendum)
  Subjective:   Patient ID: Stacey Reyes, female    DOB: [birth year], 71 y.o.   MRN: [id number] CC: Abdominal Pain (lower and started yesterday) and Diarrhea  HPI: Stacey Reyes is a 71 y.o. female presenting for Abdominal Pain (lower and started yesterday) and Diarrhea  Yesterday felt very gassy Felt slightly nauseated this morning Worked yesterday, had apprx 3 episodes of diarrhea in the morning No stool since then No blood in stool Significant lower abd pain overnight last night, moving at all hurt Feels sore today in lower abd No h/o kidney stone  Stays fairly regular with stooling, no h/o constipation No h/o abdominal surgery abd hurts with moving, pain improves when laying still Has to urinate more often than usual No dysuria  No fevers Appetite she says is always good, hasnt eaten anything yet today bc came in to office Has h/o diverticulosis  Relevant past medical, surgical, family and social history reviewed. Allergies and medications reviewed and updated. History  Smoking Status  . Former Smoker  . Quit date: 12/23/1984  Smokeless Tobacco  . Never Used   ROS: Per HPI   Objective:    BP 110/66   Pulse 92   Temp 99.5 F (37.5 C) (Oral)   Ht '5\' 6"'$  (1.676 m)   Wt 197 lb (89.4 kg)   BMI 31.80 kg/m   Wt Readings from Last 3 Encounters:  12/07/16 197 lb (89.4 kg)  11/21/15 197 lb 6.4 oz (89.5 kg)  10/12/15 195 lb 3.2 oz (88.5 kg)    Gen: NAD, alert, cooperative with exam, NCAT EYES: EOMI, no conjunctival injection, or no icterus ENT:  TMs pearly gray b/l, OP without erythema LYMPH: no cervical LAD CV: NRRR, normal G1/W2, II/VI systolic ejection murmur, distal pulses 2+ b/l Resp: CTABL, no wheezes, normal WOB Abd: +BS, soft, ttp LLQ, no guarding, no rebound, no peritoneal signs, non-distended Ext: No edema, warm Neuro: Alert and oriented, strength equal b/l UE and LE, coordination grossly normal MSK: normal muscle bulk  Assessment & Plan:    Darla was seen today for abdominal pain and diarrhea.  Diagnoses and all orders for this visit:  Diverticulitis Passing gas, no h/o abd surgeries, no known artery disease Will treat for diverticulitis, any worsening in pain needs to be seen immediately in ED for imaging -     ciprofloxacin (CIPRO) 500 MG tablet; Take 1 tablet (500 mg total) by mouth 2 (two) times daily. -     metroNIDAZOLE (FLAGYL) 500 MG tablet; Take 1 tablet (500 mg total) by mouth 3 (three) times daily.  Lower abdominal pain -     CMP14+EGFR -     CBC with Differential/Platelet -     Urinalysis -     Urine Culture   Follow up plan: Return in about 1 week (around 12/14/2016), or if symptoms worsen or fail to improve. Assunta Found, MD [place]

## 2016-12-08 LAB — CBC WITH DIFFERENTIAL/PLATELET
BASOS: 1 %
Basophils Absolute: 0.1 10*3/uL (ref 0.0–0.2)
EOS (ABSOLUTE): 0.2 10*3/uL (ref 0.0–0.4)
EOS: 2 %
HEMATOCRIT: 40.1 % (ref 34.0–46.6)
Hemoglobin: 13.3 g/dL (ref 11.1–15.9)
Immature Grans (Abs): 0 10*3/uL (ref 0.0–0.1)
Immature Granulocytes: 0 %
LYMPHS: 21 %
Lymphocytes Absolute: 2.7 10*3/uL (ref 0.7–3.1)
MCH: 29.8 pg (ref 26.6–33.0)
MCHC: 33.2 g/dL (ref 31.5–35.7)
MCV: 90 fL (ref 79–97)
MONOCYTES: 11 %
MONOS ABS: 1.5 10*3/uL — AB (ref 0.1–0.9)
NEUTROS PCT: 65 %
Neutrophils Absolute: 8.7 10*3/uL — ABNORMAL HIGH (ref 1.4–7.0)
Platelets: 258 10*3/uL (ref 150–379)
RBC: 4.46 x10E6/uL (ref 3.77–5.28)
RDW: 13.8 % (ref 12.3–15.4)
WBC: 13.2 10*3/uL — AB (ref 3.4–10.8)

## 2016-12-08 LAB — CMP14+EGFR
ALK PHOS: 43 IU/L (ref 39–117)
ALT: 41 IU/L — AB (ref 0–32)
AST: 40 IU/L (ref 0–40)
Albumin/Globulin Ratio: 2.1 (ref 1.2–2.2)
Albumin: 4.6 g/dL (ref 3.5–4.8)
BUN/Creatinine Ratio: 10 — ABNORMAL LOW (ref 12–28)
BUN: 7 mg/dL — AB (ref 8–27)
Bilirubin Total: 1.3 mg/dL — ABNORMAL HIGH (ref 0.0–1.2)
CALCIUM: 9.4 mg/dL (ref 8.7–10.3)
CO2: 25 mmol/L (ref 20–29)
CREATININE: 0.73 mg/dL (ref 0.57–1.00)
Chloride: 96 mmol/L (ref 96–106)
GFR calc Af Amer: 96 mL/min/{1.73_m2} (ref >59)
GFR, EST NON AFRICAN AMERICAN: 83 mL/min/{1.73_m2} (ref >59)
GLOBULIN, TOTAL: 2.2 g/dL (ref 1.5–4.5)
GLUCOSE: 95 mg/dL (ref 65–99)
Potassium: 3.6 mmol/L (ref 3.5–5.2)
Sodium: 137 mmol/L (ref 134–144)
Total Protein: 6.8 g/dL (ref 6.0–8.5)

## 2016-12-08 LAB — URINE CULTURE

## 2016-12-09 LAB — URINALYSIS
BILIRUBIN UA: NEGATIVE
Glucose, UA: NEGATIVE
KETONES UA: NEGATIVE
NITRITE UA: NEGATIVE
PH UA: 8.5 — AB (ref 5.0–7.5)
RBC UA: NEGATIVE
SPEC GRAV UA: 1.015 (ref 1.005–1.030)
UUROB: 1 mg/dL (ref 0.2–1.0)

## 2016-12-11 DIAGNOSIS — R109 Unspecified abdominal pain: Secondary | ICD-10-CM | POA: Diagnosis not present

## 2016-12-11 DIAGNOSIS — K5792 Diverticulitis of intestine, part unspecified, without perforation or abscess without bleeding: Secondary | ICD-10-CM | POA: Diagnosis not present

## 2016-12-12 ENCOUNTER — Ambulatory Visit: Payer: BLUE CROSS/BLUE SHIELD | Admitting: Pediatrics

## 2016-12-12 ENCOUNTER — Ambulatory Visit (INDEPENDENT_AMBULATORY_CARE_PROVIDER_SITE_OTHER): Payer: BLUE CROSS/BLUE SHIELD | Admitting: Pediatrics

## 2016-12-12 VITALS — BP 112/68 | HR 80 | Temp 97.4°F | Ht 66.0 in | Wt 198.0 lb

## 2016-12-12 DIAGNOSIS — R300 Dysuria: Secondary | ICD-10-CM

## 2016-12-12 DIAGNOSIS — N3090 Cystitis, unspecified without hematuria: Secondary | ICD-10-CM | POA: Diagnosis not present

## 2016-12-12 LAB — URINALYSIS
BILIRUBIN UA: NEGATIVE
GLUCOSE, UA: NEGATIVE
NITRITE UA: NEGATIVE
Specific Gravity, UA: >1.03 — ABNORMAL HIGH (ref 1.005–1.030)
UUROB: 0.2 mg/dL (ref 0.2–1.0)
pH, UA: 5.5 (ref 5.0–7.5)

## 2016-12-12 MED ORDER — NITROFURANTOIN MONOHYD MACRO 100 MG PO CAPS: 100.0000 mg | ORAL_CAPSULE | Freq: Two times a day (BID) | ORAL | 0 refills | Status: AC

## 2016-12-12 NOTE — Progress Notes (Signed)
  Subjective:   Patient ID: Stacey Reyes, female    DOB: 08-27-1945, 71 y.o.   MRN: [id number] CC: Urinary Tract Infection  HPI: Stacey Reyes is a 71 y.o. female presenting for Urinary Tract Infection  Started having burning with urination yesterday Drinking lots of lfuids, emptying bladder regularly Some L sided back pain Some irritation with passing urine No fevers Normal appetite  Seen 6 days ago for diverticulitis Treated with flagyl and cipro Has decreased energy Feeling much better  Relevant past medical, surgical, family and social history reviewed. Allergies and medications reviewed and updated. History  Smoking Status  . Former Smoker  . Quit date: 12/23/1984  Smokeless Tobacco  . Never Used   ROS: Per HPI   Objective:    BP 112/68   Pulse 80   Temp (!) 97.4 F (36.3 C) (Oral)   Ht 5\' 6"  (1.676 m)   Wt 198 lb (89.8 kg)   BMI 31.96 kg/m   Wt Readings from Last 3 Encounters:  12/12/16 198 lb (89.8 kg)  12/07/16 197 lb (89.4 kg)  11/21/15 197 lb 6.4 oz (89.5 kg)    Gen: NAD, alert, cooperative with exam, NCAT EYES: EOMI, no conjunctival injection, or no icterus ENT:   OP without erythema LYMPH: no cervical LAD CV: NRRR, normal S1/S2 Resp: CTABL, no wheezes, normal WOB Abd: +BS, soft, mildly ttp L side, ND. no guarding or organomegaly Ext: No edema, warm Neuro: Alert and oriented, strength equal b/l UE and LE, coordination grossly normal MSK: normal muscle bulk  Assessment & Plan:  Stacey Reyes was seen today for urinary tract infection.  Diagnoses and all orders for this visit:  Dysuria -     Urine Culture -     Urinalysis  Cystitis Finishing cipro tomorrow Will send urine culture Given starting weekend, if dysuria worsens, start below Increase fluid intake -     nitrofurantoin, macrocrystal-monohydrate, (MACROBID) 100 MG capsule; Take 1 capsule (100 mg total) by mouth 2 (two) times daily.   Follow up plan: prn Assunta Found,  MD [place]

## 2016-12-15 LAB — URINE CULTURE: ORGANISM ID, BACTERIA: NO GROWTH

## 2016-12-16 DIAGNOSIS — K5790 Diverticulosis of intestine, part unspecified, without perforation or abscess without bleeding: Secondary | ICD-10-CM | POA: Diagnosis not present

## 2016-12-16 DIAGNOSIS — Z008 Encounter for other general examination: Secondary | ICD-10-CM | POA: Diagnosis not present

## 2016-12-20 ENCOUNTER — Telehealth: Payer: Self-pay | Admitting: Pediatrics

## 2016-12-20 NOTE — Telephone Encounter (Signed)
Nothing grew in her urine. How is she feeling? If still with symptoms OK to take nitrofurantoin prescribed before.

## 2016-12-20 NOTE — Telephone Encounter (Signed)
Patient is aware of results and states that she has not urinary symptoms at this time

## 2016-12-20 NOTE — Telephone Encounter (Signed)
Second call about results.

## 2016-12-23 DIAGNOSIS — I10 Essential (primary) hypertension: Secondary | ICD-10-CM | POA: Diagnosis not present

## 2016-12-23 DIAGNOSIS — K5790 Diverticulosis of intestine, part unspecified, without perforation or abscess without bleeding: Secondary | ICD-10-CM | POA: Diagnosis not present

## 2016-12-24 DIAGNOSIS — Z961 Presence of intraocular lens: Secondary | ICD-10-CM | POA: Diagnosis not present

## 2016-12-24 DIAGNOSIS — H35031 Hypertensive retinopathy, right eye: Secondary | ICD-10-CM | POA: Diagnosis not present

## 2016-12-24 DIAGNOSIS — H04123 Dry eye syndrome of bilateral lacrimal glands: Secondary | ICD-10-CM | POA: Diagnosis not present

## 2016-12-24 DIAGNOSIS — H43811 Vitreous degeneration, right eye: Secondary | ICD-10-CM | POA: Diagnosis not present

## 2016-12-24 DIAGNOSIS — H35033 Hypertensive retinopathy, bilateral: Secondary | ICD-10-CM | POA: Diagnosis not present

## 2016-12-24 DIAGNOSIS — H35371 Puckering of macula, right eye: Secondary | ICD-10-CM | POA: Diagnosis not present

## 2016-12-24 DIAGNOSIS — H35032 Hypertensive retinopathy, left eye: Secondary | ICD-10-CM | POA: Diagnosis not present

## 2017-02-19 ENCOUNTER — Other Ambulatory Visit: Payer: Self-pay | Admitting: Family Medicine

## 2017-02-19 DIAGNOSIS — Z1231 Encounter for screening mammogram for malignant neoplasm of breast: Secondary | ICD-10-CM

## 2017-02-21 ENCOUNTER — Ambulatory Visit
Admission: RE | Admit: 2017-02-21 | Discharge: 2017-02-21 | Disposition: A | Discharge status: Home / Self Care | Payer: BLUE CROSS/BLUE SHIELD | Source: Ambulatory Visit | Attending: Family Medicine | Admitting: Family Medicine

## 2017-02-21 DIAGNOSIS — Z1231 Encounter for screening mammogram for malignant neoplasm of breast: Secondary | ICD-10-CM

## 2017-02-26 ENCOUNTER — Encounter: Payer: Self-pay | Admitting: Internal Medicine

## 2017-02-26 DIAGNOSIS — N390 Urinary tract infection, site not specified: Secondary | ICD-10-CM | POA: Diagnosis not present

## 2017-02-26 DIAGNOSIS — R011 Cardiac murmur, unspecified: Secondary | ICD-10-CM | POA: Diagnosis not present

## 2017-03-06 ENCOUNTER — Ambulatory Visit: Payer: BLUE CROSS/BLUE SHIELD | Admitting: Pediatrics

## 2017-03-06 ENCOUNTER — Encounter: Payer: Self-pay | Admitting: Pediatrics

## 2017-03-06 VITALS — BP 132/78 | HR 69 | Temp 98.0°F | Ht 66.0 in | Wt 201.6 lb

## 2017-03-06 DIAGNOSIS — N898 Other specified noninflammatory disorders of vagina: Secondary | ICD-10-CM

## 2017-03-06 DIAGNOSIS — R011 Cardiac murmur, unspecified: Secondary | ICD-10-CM

## 2017-03-06 LAB — WET PREP FOR TRICH, YEAST, CLUE
Clue Cell Exam: NEGATIVE
TRICHOMONAS EXAM: NEGATIVE
Yeast Exam: POSITIVE — AB

## 2017-03-06 NOTE — Progress Notes (Signed)
  Subjective:   Patient ID: Stacey Reyes, female    DOB: [birth year], 71 y.o.   MRN: [id number] CC: Heart Murmur and Vaginal Discharge  HPI: Stacey Reyes is a 71 y.o. female presenting for Heart Murmur (Referral to Cardiology) and Vaginal Discharge  During well visit with PA at work her heart murmur was noted to be latter and to radiate to the neck per pt Here today for follow-up Patient with echo done in July 2017 with slightly thickened aortic leaflets, no stenosis, no regurgitation in the valves Some signs of LVH, grade 1 diastolic dysfunction EF 25% Was seeing cardiology at that time for syncopal episodes Patient has not had any further syncope or presyncope Says she has been feeling well overall We will get tired if she is doing a lot of activities but this is not any different than her usual exercise tolerance It does not limit her in any way and she continues to work  She has had a slightly acidic odor in her underwear for the last few months that is new No new sexual partners Intercourse is also become more painful, it feels "tight" Her sister had breast cancer, patient has never had an abnormal mammogram  Appetite has been fine, no fevers, no chest pain, no shortness of breath or palpitations  Relevant past medical, surgical, family and social history reviewed. Allergies and medications reviewed and updated. Social History   Tobacco Use  Smoking Status Former Smoker  . Last attempt to quit: 12/23/1984  . Years since quitting: 32.2  Smokeless Tobacco Never Used   ROS: Per HPI   Objective:    BP 132/78   Pulse 69   Temp 98 F (36.7 C) (Oral)   Ht 5\' 6"  (1.676 m)   Wt 201 lb 9.6 oz (91.4 kg)   BMI 32.54 kg/m   Wt Readings from Last 3 Encounters:  03/06/17 201 lb 9.6 oz (91.4 kg)  12/12/16 198 lb (89.8 kg)  12/07/16 197 lb (89.4 kg)    Gen: NAD, alert, cooperative with exam, NCAT EYES: EOMI, no conjunctival injection, or no icterus CV: NRRR, normal  K2/H0, I/VI systolic murmur, no radiation to the carotids, distal pulses 2+ b/l Resp: CTABL, no wheezes, normal WOB Abd: +BS, soft, NTND.  Ext: No edema, warm Neuro: Alert and oriented, strength equal b/l UE and LE, coordination grossly normal MSK: normal muscle bulk  Assessment & Plan:  Stacey Reyes was seen today for heart murmur and vaginal discharge.  Diagnoses and all orders for this visit:  Vaginal discharge Wet prep negative Discussed trying any moisturizing products, lubricant with intercourse Any new symptoms let me know -     WET PREP FOR [place], YEAST, CLUE  Heart murmur Soft systolic heart murmur present today Asymptomatic, offered referral to cardiology for follow-up, patient declined for now, will continue to monitor  Follow up plan: Return in about 3 months (around 06/04/2017) for Complete physical. Assunta Found, MD [place]

## 2017-03-06 NOTE — Patient Instructions (Signed)
replens or vagifem feminine moisturizing products

## 2017-03-07 ENCOUNTER — Other Ambulatory Visit: Payer: Self-pay | Admitting: Pediatrics

## 2017-03-07 MED ORDER — FLUCONAZOLE 150 MG PO TABS: 150.0000 mg | ORAL_TABLET | Freq: Once | ORAL | 0 refills | Status: AC

## 2017-03-31 DIAGNOSIS — B373 Candidiasis of vulva and vagina: Secondary | ICD-10-CM | POA: Diagnosis not present

## 2017-04-21 DIAGNOSIS — J069 Acute upper respiratory infection, unspecified: Secondary | ICD-10-CM | POA: Diagnosis not present

## 2017-04-21 DIAGNOSIS — R0981 Nasal congestion: Secondary | ICD-10-CM | POA: Diagnosis not present

## 2017-08-13 DIAGNOSIS — E785 Hyperlipidemia, unspecified: Secondary | ICD-10-CM | POA: Diagnosis not present

## 2017-08-13 DIAGNOSIS — N390 Urinary tract infection, site not specified: Secondary | ICD-10-CM | POA: Diagnosis not present

## 2017-08-13 DIAGNOSIS — I38 Endocarditis, valve unspecified: Secondary | ICD-10-CM | POA: Diagnosis not present

## 2017-08-13 DIAGNOSIS — I10 Essential (primary) hypertension: Secondary | ICD-10-CM | POA: Diagnosis not present

## 2017-08-13 DIAGNOSIS — Z008 Encounter for other general examination: Secondary | ICD-10-CM | POA: Diagnosis not present

## 2017-08-13 DIAGNOSIS — E669 Obesity, unspecified: Secondary | ICD-10-CM | POA: Diagnosis not present

## 2017-09-01 ENCOUNTER — Other Ambulatory Visit: Payer: Self-pay | Admitting: *Deleted

## 2017-09-01 DIAGNOSIS — R011 Cardiac murmur, unspecified: Secondary | ICD-10-CM

## 2017-09-01 DIAGNOSIS — I10 Essential (primary) hypertension: Secondary | ICD-10-CM

## 2017-09-01 DIAGNOSIS — E781 Pure hyperglyceridemia: Secondary | ICD-10-CM

## 2017-09-01 DIAGNOSIS — I499 Cardiac arrhythmia, unspecified: Secondary | ICD-10-CM

## 2017-09-10 ENCOUNTER — Telehealth: Payer: Self-pay | Admitting: Family Medicine

## 2017-09-10 NOTE — Telephone Encounter (Signed)
Aware.  Referral was done so she can call directly to Dr. Alver Fisher office to schedule.  [phone].

## 2017-09-17 DIAGNOSIS — R7309 Other abnormal glucose: Secondary | ICD-10-CM | POA: Diagnosis not present

## 2017-09-22 DIAGNOSIS — R7309 Other abnormal glucose: Secondary | ICD-10-CM | POA: Diagnosis not present

## 2017-10-06 DIAGNOSIS — R799 Abnormal finding of blood chemistry, unspecified: Secondary | ICD-10-CM | POA: Diagnosis not present

## 2017-10-13 DIAGNOSIS — R799 Abnormal finding of blood chemistry, unspecified: Secondary | ICD-10-CM | POA: Diagnosis not present

## 2017-10-15 DIAGNOSIS — R799 Abnormal finding of blood chemistry, unspecified: Secondary | ICD-10-CM | POA: Diagnosis not present

## 2017-10-16 ENCOUNTER — Ambulatory Visit (INDEPENDENT_AMBULATORY_CARE_PROVIDER_SITE_OTHER): Payer: BLUE CROSS/BLUE SHIELD

## 2017-10-16 ENCOUNTER — Ambulatory Visit: Payer: BLUE CROSS/BLUE SHIELD | Admitting: Family Medicine

## 2017-10-16 ENCOUNTER — Encounter: Payer: Self-pay | Admitting: Family Medicine

## 2017-10-16 VITALS — BP 130/78 | HR 77 | Temp 98.3°F | Ht 66.0 in | Wt 203.0 lb

## 2017-10-16 DIAGNOSIS — R1032 Left lower quadrant pain: Secondary | ICD-10-CM

## 2017-10-16 DIAGNOSIS — R1012 Left upper quadrant pain: Secondary | ICD-10-CM | POA: Diagnosis not present

## 2017-10-16 MED ORDER — METRONIDAZOLE 500 MG PO TABS: 500.0000 mg | ORAL_TABLET | Freq: Three times a day (TID) | ORAL | 0 refills | Status: DC

## 2017-10-16 MED ORDER — AMOXICILLIN-POT CLAVULANATE 875-125 MG PO TABS: 1.0000 | ORAL_TABLET | Freq: Two times a day (BID) | ORAL | 0 refills | Status: AC

## 2017-10-16 NOTE — Progress Notes (Signed)
Cardiology Office Note   Date:  10/17/2017   ID:  Stacey Reyes, DOB [birth year], MRN [id number]  PCP:  Claretta Fraise, MD  Cardiologist:   No primary care provider on file. Referring:  Claretta Fraise, MD  Chief Complaint  Patient presents with  . Heart Murmur      History of Present Illness: Stacey Reyes is a 72 y.o. female who is referred by Claretta Fraise, MD for evaluation of a heart murmur .  She saw Dr. Pernell Dupre for evaluation of palpitations.  She was seen in 2017 with SVT or afib suspected but not detected on event monitor.   Echo EF was normal in 2017.  She was referred back because recently she was noted to have an increasing murmur.  She has not had any cardiac complaints.   She might get short of breath climbing up a flight of stairs and this is somewhat new.  However, she has no resting symptoms.  She denies PND or orthopnea.  Said no palpitations, presyncope or syncope.  She is had no chest pressure, neck or arm discomfort.   Past Medical History:  Diagnosis Date  . Chronic cystitis   . Hyperlipidemia   . Hypertension   . Personal history of colonic polyps-adenomas 01/07/2012   2009 - 2 diminutive adenomas (prior polyps also) 01/07/2012 - 2 diminutive adenomas      Past Surgical History:  Procedure Laterality Date  . BREAST BIOPSY Right    No Scar seen   . BREAST SURGERY     breast biopsy/ right /benign  . COLONOSCOPY  multiple     Current Outpatient Medications  Medication Sig Dispense Refill  . amoxicillin-clavulanate (AUGMENTIN) 875-125 MG tablet Take 1 tablet by mouth 2 (two) times daily for 7 days. 14 tablet 0  . aspirin 81 MG tablet Take 81 mg by mouth daily.    . cholecalciferol (VITAMIN D) 1000 units tablet Take 1,000 Units by mouth daily.    Marland Kitchen lisinopril-hydrochlorothiazide (PRINZIDE,ZESTORETIC) 20-12.5 MG per tablet TAKE 1 TABLET DAILY 30 tablet 0  . metroNIDAZOLE (FLAGYL) 500 MG tablet Take 1 tablet (500 mg total) by mouth 3  (three) times daily. 21 tablet 0  . Rosuvastatin Calcium 10 MG CPSP Take 10 mg by mouth.    . thiamine (VITAMIN B-1) 100 MG tablet Take 100 mg by mouth daily.    . vitamin E 400 UNIT capsule Take 400 Units by mouth. Take 2 daily     No current facility-administered medications for this visit.     Allergies:   Sulfur     ROS:  Please see the history of present illness.   Otherwise, review of systems are positive for none.   All other systems are reviewed and negative.    PHYSICAL EXAM: VS:  BP 132/72 (BP Location: Right Arm, Patient Position: Sitting, Cuff Size: Normal)   Pulse 69   Ht 5\' 6"  (1.676 m)   Wt 202 lb 9.6 oz (91.9 kg)   BMI 32.70 kg/m  , BMI Body mass index is 32.7 kg/m. GENERAL:  Well appearing NECK:  No jugular venous distention, waveform within normal limits, carotid upstroke brisk and symmetric, no bruits, no thyromegaly LUNGS:  Clear to auscultation bilaterally BACK:  No CVA tenderness CHEST:  Unremarkable HEART:  PMI not displaced or sustained,S1 and S2 within normal limits, no S3, no S4, no clicks, no rubs, 2 out of 6 apical and right upper sternal border systolic murmur that is not  increased with the strain phase of Valsalva and seems to be slightly more prominent with the patient seated upright, no diastolic murmurs ABD:  Flat, positive bowel sounds normal in frequency in pitch, no bruits, no rebound, no guarding, no midline pulsatile mass, no hepatomegaly, no splenomegaly EXT:  2 plus pulses throughout, no edema, no cyanosis no clubbing    EKG:  EKG is ordered today. The ekg ordered today demonstrates sinus rhythm, rate 69, axis within normal limits, voltage criteria for left ventricular hypertrophy, no acute ST-T wave changes.   Recent Labs: 12/07/2016: ALT 41; BUN 7; Creatinine, Ser 0.73; Hemoglobin 13.3; Platelets 258; Potassium 3.6; Sodium 137    Lipid Panel    Component Value Date/Time   CHOL 185 12/05/2011   TRIG 187 (A) 12/05/2011   HDL 44  12/05/2011   LDLCALC 110 12/05/2011      Wt Readings from Last 3 Encounters:  10/17/17 202 lb 9.6 oz (91.9 kg)  10/16/17 203 lb (92.1 kg)  03/06/17 201 lb 9.6 oz (91.4 kg)      Other studies Reviewed: Additional studies/ records that were reviewed today include:   Holter, echo and recent lab results. Review of the above records demonstrates:  Please see elsewhere in the note.     ASSESSMENT AND PLAN:  MURMUR: I am surprised to hear more prominent murmur than I would have thought based on the previous echo.  And then a follow-up with a repeat echocardiogram.  She is not having any symptoms.  This probably can be followed clinically but further evaluation will be based on these results.  HTN:  The blood pressure is at target. No change in medications is indicated. We will continue with therapeutic lifestyle changes (TLC).     Current medicines are reviewed at length with the patient today.  The patient does not have concerns regarding medicines.  The following changes have been made:  no change  Labs/ tests ordered today include:   Orders Placed This Encounter  Procedures  . ECHOCARDIOGRAM COMPLETE     Disposition:   FU with me as needed.      Signed, Minus Breeding, MD  10/17/2017 1:12 PM    [facility]

## 2017-10-16 NOTE — Progress Notes (Signed)
Subjective: HR:CBULAGTXM pain PCP: Claretta Fraise, MD IWO:[id number] Stacey Reyes is a 72 y.o. female presenting to clinic today for:  1. Abdominal pain Patient with an 8-hour history of severe left-sided low abdominal pain.  She points to the area of the sigmoid colon.  She describes the pain as constant but waxing and waning in severity.  It radiates onto the left low back.  She denies any diarrhea, constipation, fevers, dysuria, hematuria, urinary frequency or urgency.  She did have some nausea with the pain but no vomiting.  She is not strained herself with activity recently.  She has taken no medications for problem.  Colonoscopy from 01/07/2012 noted severe diverticulosis within the left colon and mild diverticulosis within the right colon.   ROS: Per HPI  Allergies  Allergen Reactions  . Sulfur Rash   Past Medical History:  Diagnosis Date  . Chronic cystitis   . Hyperlipidemia   . Hypertension   . Insomnia   . Personal history of colonic polyps-adenomas 01/07/2012   2009 - 2 diminutive adenomas (prior polyps also) 01/07/2012 - 2 diminutive adenomas      Current Outpatient Medications:  .  aspirin 81 MG tablet, Take 81 mg by mouth daily., Disp: , Rfl:  .  cholecalciferol (VITAMIN D) 1000 units tablet, Take 1,000 Units by mouth daily., Disp: , Rfl:  .  lisinopril-hydrochlorothiazide (PRINZIDE,ZESTORETIC) 20-12.5 MG per tablet, TAKE 1 TABLET DAILY, Disp: 30 tablet, Rfl: 0 .  Rosuvastatin Calcium 10 MG CPSP, Take 10 mg by mouth., Disp: , Rfl:  .  thiamine (VITAMIN B-1) 100 MG tablet, Take 100 mg by mouth daily., Disp: , Rfl:  .  vitamin E 400 UNIT capsule, Take 400 Units by mouth. Take 2 daily, Disp: , Rfl:  .  amoxicillin-clavulanate (AUGMENTIN) 875-125 MG tablet, Take 1 tablet by mouth 2 (two) times daily for 7 days., Disp: 14 tablet, Rfl: 0 .  metroNIDAZOLE (FLAGYL) 500 MG tablet, Take 1 tablet (500 mg total) by mouth 3 (three) times daily., Disp: 21 tablet, Rfl: 0 Social  History   Socioeconomic History  . Marital status: Married    Spouse name: Not on file  . Number of children: Not on file  . Years of education: Not on file  . Highest education level: Not on file  Occupational History  . Not on file  Social Needs  . Financial resource strain: Not on file  . Food insecurity:    Worry: Not on file    Inability: Not on file  . Transportation needs:    Medical: Not on file    Non-medical: Not on file  Tobacco Use  . Smoking status: Former Smoker    Last attempt to quit: 12/23/1984    Years since quitting: 32.8  . Smokeless tobacco: Never Used  Substance and Sexual Activity  . Alcohol use: No  . Drug use: No  . Sexual activity: Not on file  Lifestyle  . Physical activity:    Days per week: Not on file    Minutes per session: Not on file  . Stress: Not on file  Relationships  . Social connections:    Talks on phone: Not on file    Gets together: Not on file    Attends religious service: Not on file    Active member of club or organization: Not on file    Attends meetings of clubs or organizations: Not on file    Relationship status: Not on file  . Intimate partner violence:  Fear of current or ex partner: Not on file    Emotionally abused: Not on file    Physically abused: Not on file    Forced sexual activity: Not on file  Other Topics Concern  . Not on file  Social History Narrative  . Not on file   Family History  Problem Relation Age of Onset  . Colon cancer Mother 12       80's  . Breast cancer Sister     Objective: Office vital signs reviewed. BP 130/78   Pulse 77   Temp 98.3 F (36.8 C) (Oral)   Ht 5\' 6"  (1.676 m)   Wt 203 lb (92.1 kg)   BMI 32.77 kg/m   Physical Examination:  General: Awake, alert, obese, No acute distress HEENT: Normal, MMM, sclera white. Cardio: regular rate  Pulm: normal work of breathing on room air GI: soft, moderate TTP to the LLQ. No peritoneal signs.  No guarding. Non-distended,  bowel sounds present x4, no hepatomegaly, no splenomegaly, no masses GU: no suprapubic TTP  No results found.  Assessment/ Plan: 72 y.o. female   1. LLQ abdominal pain Patient afebrile and nontoxic-appearing. Vitals are stable. Her physical exam was remarkable for moderate tenderness to palpation to the left lower quadrant.  I reviewed her colonoscopy from 2013 which did demonstrate severe diverticulosis within the left colon.  Differential diagnosis considered include gas pains versus constipation versus SBO.  I reviewed her abdominal x-ray which demonstrated some stool in the left side of the colon but no other significant findings.  Awaiting formal review by radiologist.  Given her known severe L sided diverticulosis and severity of pain, I think that treating for diverticulitis is appropriate.  There is a 16% resistance of E. coli to fluoroquinolones in our region.  For this reason, I have elected to empirically treat patient with Augmentin and Flagyl.  Check CBC and BMP.  We discussed that if there was no significant improvement within the next 2 days that the next step would be to obtain CT abdomen/pelvis with contrast.  Home care instructions were reviewed with the patient.  Handout provided.  Reasons for emergent evaluation the emergency department discussed.  Patient will follow-up as needed with PCP. - DG Abd 1 View; Future - CBC with Differential - Basic Metabolic Panel   Orders Placed This Encounter  Procedures  . DG Abd 1 View    Standing Status:   Future    Number of Occurrences:   1    Standing Expiration Date:   12/16/2018    Order Specific Question:   Reason for Exam (SYMPTOM  OR DIAGNOSIS REQUIRED)    Answer:   LLQ abdominal pain. bloating but no diarrhea.    Order Specific Question:   Preferred imaging location?    Answer:   Internal  . CBC with Differential  . Basic Metabolic Panel   Meds ordered this encounter  Medications  . amoxicillin-clavulanate (AUGMENTIN)  875-125 MG tablet    Sig: Take 1 tablet by mouth 2 (two) times daily for 7 days.    Dispense:  14 tablet    Refill:  0  . metroNIDAZOLE (FLAGYL) 500 MG tablet    Sig: Take 1 tablet (500 mg total) by mouth 3 (three) times daily.    Dispense:  21 tablet    Refill:  [place], DO [place] [phone]

## 2017-10-16 NOTE — Patient Instructions (Signed)
Diverticulitis °Diverticulitis is infection or inflammation of small pouches (diverticula) in the colon that form due to a condition called diverticulosis. Diverticula can trap stool (feces) and bacteria, causing infection and inflammation. °Diverticulitis may cause severe stomach pain and diarrhea. It may lead to tissue damage in the colon that causes bleeding. The diverticula may also burst (rupture) and cause infected stool to enter other areas of the abdomen. °Complications of diverticulitis can include: °· Bleeding. °· Severe infection. °· Severe pain. °· Rupture (perforation) of the colon. °· Blockage (obstruction) of the colon. ° °What are the causes? °This condition is caused by stool becoming trapped in the diverticula, which allows bacteria to grow in the diverticula. This leads to inflammation and infection. °What increases the risk? °You are more likely to develop this condition if: °· You have diverticulosis. The risk for diverticulosis increases if: °? You are overweight or obese. °? You use tobacco products. °? You do not get enough exercise. °· You eat a diet that does not include enough fiber. High-fiber foods include fruits, vegetables, beans, nuts, and whole grains. ° °What are the signs or symptoms? °Symptoms of this condition may include: °· Pain and tenderness in the abdomen. The pain is normally located on the left side of the abdomen, but it may occur in other areas. °· Fever and chills. °· Bloating. °· Cramping. °· Nausea. °· Vomiting. °· Changes in bowel routines. °· Blood in your stool. ° °How is this diagnosed? °This condition is diagnosed based on: °· Your medical history. °· A physical exam. °· Tests to make sure there is nothing else causing your condition. These tests may include: °? Blood tests. °? Urine tests. °? Imaging tests of the abdomen, including X-rays, ultrasounds, MRIs, or CT scans. ° °How is this treated? °Most cases of this condition are mild and can be treated at home.  Treatment may include: °· Taking over-the-counter pain medicines. °· Following a clear liquid diet. °· Taking antibiotic medicines by mouth. °· Rest. ° °More severe cases may need to be treated at a hospital. Treatment may include: °· Not eating or drinking. °· Taking prescription pain medicine. °· Receiving antibiotic medicines through an IV tube. °· Receiving fluids and nutrition through an IV tube. °· Surgery. ° °When your condition is under control, your health care provider may recommend that you have a colonoscopy. This is an exam to look at the entire large intestine. During the exam, a lubricated, bendable tube is inserted into the anus and then passed into the rectum, colon, and other parts of the large intestine. A colonoscopy can show how severe your diverticula are and whether something else may be causing your symptoms. °Follow these instructions at home: °Medicines °· Take over-the-counter and prescription medicines only as told by your health care provider. These include fiber supplements, probiotics, and stool softeners. °· If you were prescribed an antibiotic medicine, take it as told by your health care provider. Do not stop taking the antibiotic even if you start to feel better. °· Do not drive or use heavy machinery while taking prescription pain medicine. °General instructions °· Follow a full liquid diet or another diet as directed by your health care provider. After your symptoms improve, your health care provider may tell you to change your diet. He or she may recommend that you eat a diet that contains at least 25 g (25 grams) of fiber daily. Fiber makes it easier to pass stool. Healthy sources of fiber include: °? Berries. One cup   contains 4-8 grams of fiber. °? Beans or lentils. One half cup contains 5-8 grams of fiber. °? Green vegetables. One cup contains 4 grams of fiber. °· Exercise for at least 30 minutes, 3 times each week. You should exercise hard enough to raise your heart rate and  break a sweat. °· Keep all follow-up visits as told by your health care provider. This is important. You may need a colonoscopy. °Contact a health care provider if: °· Your pain does not improve. °· You have a hard time drinking or eating food. °· Your bowel movements do not return to normal. °Get help right away if: °· Your pain gets worse. °· Your symptoms do not get better with treatment. °· Your symptoms suddenly get worse. °· You have a fever. °· You vomit more than one time. °· You have stools that are bloody, black, or tarry. °Summary °· Diverticulitis is infection or inflammation of small pouches (diverticula) in the colon that form due to a condition called diverticulosis. Diverticula can trap stool (feces) and bacteria, causing infection and inflammation. °· You are at higher risk for this condition if you have diverticulosis and you eat a diet that does not include enough fiber. °· Most cases of this condition are mild and can be treated at home. More severe cases may need to be treated at a hospital. °· When your condition is under control, your health care provider may recommend that you have an exam called a colonoscopy. This exam can show how severe your diverticula are and whether something else may be causing your symptoms. °This information is not intended to replace advice given to you by your health care provider. Make sure you discuss any questions you have with your health care provider. °Document Released: 12/19/2004 Document Revised: 04/13/2016 Document Reviewed: 04/13/2016 °Elsevier Interactive Patient Education © 2018 [employer] ° °

## 2017-10-17 ENCOUNTER — Ambulatory Visit: Payer: BLUE CROSS/BLUE SHIELD | Admitting: Cardiology

## 2017-10-17 ENCOUNTER — Encounter: Payer: Self-pay | Admitting: Cardiology

## 2017-10-17 VITALS — BP 132/72 | HR 69 | Ht 66.0 in | Wt 202.6 lb

## 2017-10-17 DIAGNOSIS — R011 Cardiac murmur, unspecified: Secondary | ICD-10-CM | POA: Diagnosis not present

## 2017-10-17 NOTE — Patient Instructions (Signed)
Medication Instructions:  Continue current medications  If you need a refill on your cardiac medications before your next appointment, please call your pharmacy.  Labwork: None Ordered   Testing/Procedures: Your physician has requested that you have an echocardiogram. Echocardiography is a painless test that uses sound waves to create images of your heart. It provides your doctor with information about the size and shape of your heart and how well your heart's chambers and valves are working. This procedure takes approximately one hour. There are no restrictions for this procedure.  Follow-Up: Your physician wants you to follow-up in: As Needed.      Thank you for choosing [facility] at [facility]!!

## 2017-10-17 NOTE — Addendum Note (Signed)
Addended by: Jacqulynn Cadet on: 10/17/2017 05:27 PM   Modules accepted: Orders

## 2017-10-20 ENCOUNTER — Ambulatory Visit (HOSPITAL_COMMUNITY): Payer: BLUE CROSS/BLUE SHIELD | Attending: Internal Medicine

## 2017-10-20 ENCOUNTER — Other Ambulatory Visit: Payer: Self-pay

## 2017-10-20 DIAGNOSIS — R011 Cardiac murmur, unspecified: Secondary | ICD-10-CM | POA: Insufficient documentation

## 2017-10-20 DIAGNOSIS — R002 Palpitations: Secondary | ICD-10-CM | POA: Insufficient documentation

## 2017-10-20 DIAGNOSIS — Z87891 Personal history of nicotine dependence: Secondary | ICD-10-CM | POA: Diagnosis not present

## 2017-10-20 DIAGNOSIS — I10 Essential (primary) hypertension: Secondary | ICD-10-CM | POA: Insufficient documentation

## 2017-10-20 DIAGNOSIS — E785 Hyperlipidemia, unspecified: Secondary | ICD-10-CM | POA: Insufficient documentation

## 2017-10-27 DIAGNOSIS — B373 Candidiasis of vulva and vagina: Secondary | ICD-10-CM | POA: Diagnosis not present

## 2017-10-29 DIAGNOSIS — N390 Urinary tract infection, site not specified: Secondary | ICD-10-CM | POA: Diagnosis not present

## 2017-11-03 DIAGNOSIS — N390 Urinary tract infection, site not specified: Secondary | ICD-10-CM | POA: Diagnosis not present

## 2017-11-05 DIAGNOSIS — N390 Urinary tract infection, site not specified: Secondary | ICD-10-CM | POA: Diagnosis not present

## 2017-12-01 DIAGNOSIS — Z008 Encounter for other general examination: Secondary | ICD-10-CM | POA: Diagnosis not present

## 2017-12-01 DIAGNOSIS — K5730 Diverticulosis of large intestine without perforation or abscess without bleeding: Secondary | ICD-10-CM | POA: Diagnosis not present

## 2018-01-05 DIAGNOSIS — Z23 Encounter for immunization: Secondary | ICD-10-CM | POA: Diagnosis not present

## 2018-01-07 DIAGNOSIS — B373 Candidiasis of vulva and vagina: Secondary | ICD-10-CM | POA: Diagnosis not present

## 2018-01-15 ENCOUNTER — Other Ambulatory Visit: Payer: Self-pay | Admitting: Family Medicine

## 2018-01-15 DIAGNOSIS — Z1231 Encounter for screening mammogram for malignant neoplasm of breast: Secondary | ICD-10-CM

## 2018-02-11 DIAGNOSIS — I38 Endocarditis, valve unspecified: Secondary | ICD-10-CM | POA: Diagnosis not present

## 2018-02-11 DIAGNOSIS — E785 Hyperlipidemia, unspecified: Secondary | ICD-10-CM | POA: Diagnosis not present

## 2018-02-11 DIAGNOSIS — I10 Essential (primary) hypertension: Secondary | ICD-10-CM | POA: Diagnosis not present

## 2018-02-11 DIAGNOSIS — E669 Obesity, unspecified: Secondary | ICD-10-CM | POA: Diagnosis not present

## 2018-03-02 ENCOUNTER — Encounter: Payer: Self-pay | Admitting: Family Medicine

## 2018-03-02 ENCOUNTER — Ambulatory Visit (INDEPENDENT_AMBULATORY_CARE_PROVIDER_SITE_OTHER): Payer: BLUE CROSS/BLUE SHIELD

## 2018-03-02 ENCOUNTER — Ambulatory Visit
Admission: RE | Admit: 2018-03-02 | Discharge: 2018-03-02 | Disposition: A | Discharge status: Home / Self Care | Payer: BLUE CROSS/BLUE SHIELD | Source: Ambulatory Visit | Attending: Family Medicine | Admitting: Family Medicine

## 2018-03-02 ENCOUNTER — Ambulatory Visit: Payer: BLUE CROSS/BLUE SHIELD | Admitting: Family Medicine

## 2018-03-02 VITALS — BP 110/66 | HR 95 | Temp 98.6°F | Ht 66.0 in | Wt 200.0 lb

## 2018-03-02 DIAGNOSIS — R5383 Other fatigue: Secondary | ICD-10-CM | POA: Diagnosis not present

## 2018-03-02 DIAGNOSIS — R0600 Dyspnea, unspecified: Secondary | ICD-10-CM

## 2018-03-02 DIAGNOSIS — R0609 Other forms of dyspnea: Secondary | ICD-10-CM

## 2018-03-02 DIAGNOSIS — R011 Cardiac murmur, unspecified: Secondary | ICD-10-CM | POA: Diagnosis not present

## 2018-03-02 DIAGNOSIS — Z1231 Encounter for screening mammogram for malignant neoplasm of breast: Secondary | ICD-10-CM

## 2018-03-02 DIAGNOSIS — I10 Essential (primary) hypertension: Secondary | ICD-10-CM | POA: Diagnosis not present

## 2018-03-02 DIAGNOSIS — I340 Nonrheumatic mitral (valve) insufficiency: Secondary | ICD-10-CM

## 2018-03-02 DIAGNOSIS — R0789 Other chest pain: Secondary | ICD-10-CM

## 2018-03-02 DIAGNOSIS — I38 Endocarditis, valve unspecified: Secondary | ICD-10-CM | POA: Diagnosis not present

## 2018-03-02 NOTE — Progress Notes (Addendum)
Subjective:    Patient ID: Stacey Reyes, female    DOB: 09/14/1945, 72 y.o.   MRN: [id number]  Chief Complaint:  Shortness of Breath, fatigue   HPI: Stacey Reyes is a 72 y.o. female presenting on 03/02/2018 for Shortness of Breath, fatigue  Pt presents today with complaints of exertional shortness of breath and fatigue. States this started a few days ago. States She was seen by the NP in her office and told she needed to be seen by her PCP due to heart murmur. Pt states she has noticed increased shortness of breath and inability to take a deep breath with exertion. States she feels as if she can only take a shallow breath. Denies chest pain, neck pain, jaw pain, PND, orthopnea, cough, or lower extremity swelling. She was last seen by cardiology on 10/17/17 and had an echo on 10/20/17. The echo revealed mild LVH, mild diastolic dysfunction, mitral valve regurgitation and an EF of 65-70%. She has not followed up with cardiology since echo.   Relevant past medical, surgical, family, and social history reviewed and updated as indicated.  Allergies and medications reviewed and updated.   Past Medical History:  Diagnosis Date  . Chronic cystitis   . Hyperlipidemia   . Hypertension   . Personal history of colonic polyps-adenomas 01/07/2012   2009 - 2 diminutive adenomas (prior polyps also) 01/07/2012 - 2 diminutive adenomas      Past Surgical History:  Procedure Laterality Date  . BREAST BIOPSY Right    No Scar seen   . BREAST SURGERY     breast biopsy/ right /benign  . COLONOSCOPY  multiple    Social History   Socioeconomic History  . Marital status: Married    Spouse name: Not on file  . Number of children: Not on file  . Years of education: Not on file  . Highest education level: Not on file  Occupational History  . Not on file  Social Needs  . Financial resource strain: Not on file  . Food insecurity:    Worry: Not on file    Inability: Not on file  .  Transportation needs:    Medical: Not on file    Non-medical: Not on file  Tobacco Use  . Smoking status: Former Smoker    Last attempt to quit: 12/23/1984    Years since quitting: 33.2  . Smokeless tobacco: Never Used  Substance and Sexual Activity  . Alcohol use: No  . Drug use: No  . Sexual activity: Not on file  Lifestyle  . Physical activity:    Days per week: Not on file    Minutes per session: Not on file  . Stress: Not on file  Relationships  . Social connections:    Talks on phone: Not on file    Gets together: Not on file    Attends religious service: Not on file    Active member of club or organization: Not on file    Attends meetings of clubs or organizations: Not on file    Relationship status: Not on file  . Intimate partner violence:    Fear of current or ex partner: Not on file    Emotionally abused: Not on file    Physically abused: Not on file    Forced sexual activity: Not on file  Other Topics Concern  . Not on file  Social History Narrative  . Not on file    Outpatient Encounter Medications as  of 03/02/2018  Medication Sig  . aspirin 81 MG tablet Take 81 mg by mouth daily.  . cholecalciferol (VITAMIN D) 1000 units tablet Take 1,000 Units by mouth daily.  Marland Kitchen lisinopril-hydrochlorothiazide (PRINZIDE,ZESTORETIC) 20-12.5 MG per tablet TAKE 1 TABLET DAILY  . metroNIDAZOLE (FLAGYL) 500 MG tablet Take 1 tablet (500 mg total) by mouth 3 (three) times daily.  . Rosuvastatin Calcium 10 MG CPSP Take 10 mg by mouth.  . thiamine (VITAMIN B-1) 100 MG tablet Take 100 mg by mouth daily.  . vitamin E 400 UNIT capsule Take 400 Units by mouth. Take 2 daily   No facility-administered encounter medications on file as of 03/02/2018.     Allergies  Allergen Reactions  . Sulfur Rash    Review of Systems  Constitutional: Positive for fatigue. Negative for activity change, chills, diaphoresis and fever.  Respiratory: Positive for shortness of breath (exertional ).  Negative for cough and wheezing.   Cardiovascular: Negative for chest pain, palpitations and leg swelling.  Gastrointestinal: Negative for abdominal distention.  Skin: Negative for color change.  Neurological: Negative for dizziness, tremors, seizures, syncope, weakness, light-headedness, numbness and headaches.  Psychiatric/Behavioral: Negative for confusion.        Objective:    BP 110/66   Pulse 95   Temp 98.6 F (37 C) (Oral)   Ht 5' 6"  (1.676 m)   Wt 200 lb (90.7 kg)   SpO2 97%   BMI 32.28 kg/m    Wt Readings from Last 3 Encounters:  03/02/18 200 lb (90.7 kg)  10/17/17 202 lb 9.6 oz (91.9 kg)  10/16/17 203 lb (92.1 kg)    Physical Exam  Constitutional: She is oriented to person, place, and time. She appears well-developed and well-nourished. She is cooperative. No distress.  HENT:  Head: Normocephalic and atraumatic.  Eyes: Pupils are equal, round, and reactive to light. Conjunctivae, EOM and lids are normal.  Neck: Trachea normal and phonation normal. Neck supple. Normal carotid pulses, no hepatojugular reflux and no JVD present. Carotid bruit is not present. No thyroid mass and no thyromegaly present.  Cardiovascular: Normal rate, regular rhythm and normal pulses. PMI is not displaced. Exam reveals no gallop and no friction rub.  Murmur heard.  Systolic murmur is present with a grade of 3/6. Pulmonary/Chest: Effort normal and breath sounds normal. She has no decreased breath sounds. She has no wheezes. She has no rhonchi. She has no rales.  Abdominal: Soft. Normal appearance and bowel sounds are normal.  Neurological: She is alert and oriented to person, place, and time.  Skin: Skin is warm and dry. Capillary refill takes less than 2 seconds. No cyanosis.  Psychiatric: Her speech is normal and behavior is normal. Judgment and thought content normal. Her mood appears anxious. Cognition and memory are normal.  Nursing note and vitals reviewed.   Results for orders  placed or performed in visit on 03/02/18  CBC with Differential/Platelet  Result Value Ref Range   WBC 9.9 3.4 - 10.8 x10E3/uL   RBC 4.57 3.77 - 5.28 x10E6/uL   Hemoglobin 13.3 11.1 - 15.9 g/dL   Hematocrit 40.3 34.0 - 46.6 %   MCV 88 79 - 97 fL   MCH 29.1 26.6 - 33.0 pg   MCHC 33.0 31.5 - 35.7 g/dL   RDW 12.7 12.3 - 15.4 %   Platelets 235 150 - 450 x10E3/uL   Neutrophils 70 Not Estab. %   Lymphs 18 Not Estab. %   Monocytes 9 Not Estab. %  Eos 2 Not Estab. %   Basos 1 Not Estab. %   Neutrophils Absolute 7.0 1.4 - 7.0 x10E3/uL   Lymphocytes Absolute 1.8 0.7 - 3.1 x10E3/uL   Monocytes Absolute 0.9 0.1 - 0.9 x10E3/uL   EOS (ABSOLUTE) 0.2 0.0 - 0.4 x10E3/uL   Basophils Absolute 0.1 0.0 - 0.2 x10E3/uL   Immature Granulocytes 0 Not Estab. %   Immature Grans (Abs) 0.0 0.0 - 0.1 x10E3/uL  BMP8+EGFR  Result Value Ref Range   Glucose 111 (H) 65 - 99 mg/dL   BUN 10 8 - 27 mg/dL   Creatinine, Ser 0.60 0.57 - 1.00 mg/dL   GFR calc non Af Amer 91 >59 mL/min/1.73   GFR calc Af Amer 105 >59 mL/min/1.73   BUN/Creatinine Ratio 17 12 - 28   Sodium 141 134 - 144 mmol/L   Potassium 3.7 3.5 - 5.2 mmol/L   Chloride 102 96 - 106 mmol/L   CO2 24 20 - 29 mmol/L   Calcium 9.4 8.7 - 10.3 mg/dL  TSH  Result Value Ref Range   TSH 0.938 0.450 - 4.500 uIU/mL     EKG in office comparable to 10/17/17 EKG, no acute findings, no ST elevation or ectopy. LVH. X-Ray: Chest: No acute findings. Preliminary x-ray reading by Monia Pouch, FNP-C, [facility].   Pertinent labs & imaging results that were available during my care of the patient were reviewed by me and considered in my medical decision making.  Assessment & Plan:  Shalyn was seen today for shortness of breath, fatigue.  Diagnoses and all orders for this visit:  Heart murmur Labs pending. Follow up with cardiology, call Dr. Percival Spanish for an appointment.  -     DG Chest 2 View; Future -     CBC with Differential/Platelet -     BMP8+EGFR -      TSH  Dyspnea on exertion, Fatigue, Tightness in chest Labs pending. Follow up with cardiology, call Dr. Percival Spanish for an appointment.  -     DG Chest 2 View; Future -     CBC with Differential/Platelet -     BMP8+EGFR -     TSH -     EKG 12-Lead  Continue all other maintenance medications.  Follow up plan: Return in about 4 weeks (around 03/30/2018), or if symptoms worsen or fail to improve.  Educational handout given for heart murmur   The above assessment and management plan was discussed with the patient. The patient verbalized understanding of and has agreed to the management plan. Patient is aware to call the clinic if symptoms persist or worsen. Patient is aware when to return to the clinic for a follow-up visit. Patient educated on when it is appropriate to go to the emergency department.   Monia Pouch, FNP-C [facility] [phone]

## 2018-03-02 NOTE — Patient Instructions (Signed)
Heart Murmur A heart murmur is an extra sound that is caused by chaotic blood flow. The murmur can be heard as a "hum" or "whoosh" sound when blood flows through the heart. The heart has four areas called chambers. Valves separate the upper and lower chambers from each other (tricuspid valve and mitral valve) and separate the lower chambers of the heart from pathways that lead away from the heart (aortic valve and pulmonary valve). Normally, the valves open to let blood flow through or out of your heart, and then they shut to keep the blood from flowing backward. There are two types of heart murmurs:  Innocent murmurs. Most people with this type of heart murmur do not have a heart problem. Many children have innocent heart murmurs. Your health care provider may suggest some basic testing to find out whether your murmur is an innocent murmur. If an innocent heart murmur is found, there is no need for further tests or treatment and no need to restrict activities or stop playing sports.  Abnormal murmurs. These types of murmurs can occur in children and adults. Abnormal murmurs may be a sign of a more serious heart condition, such as a heart defect present at birth (congenital defect) or heart valve disease.  What are the causes? This condition is caused by heart valves that are not working properly. In children, abnormal heart murmurs are typically caused by congenital defects. In adults, abnormal murmurs are usually from heart valve problems caused by disease, infection, or aging. Three types of heart valve defects can cause a murmur:  Regurgitation. This is when blood leaks back through the valve in the wrong direction.  Mitral valve prolapse. This is when the mitral valve of the heart has a loose flap and does not close tightly.  Stenosis. This is when a valve does not open enough and blocks blood flow.  This condition may also be caused by:  Pregnancy.  Fever.  Overactive thyroid  gland.  Anemia.  Exercise.  Rapid growth spurts (in children).  What are the signs or symptoms? Innocent murmurs do not cause symptoms, and many people with abnormal murmurs may or may not have symptoms. If symptoms do develop, they may include:  Shortness of breath.  Blue coloring of the skin, especially on the fingertips.  Chest pain.  Palpitations, or feeling a fluttering or skipped heartbeat.  Fainting.  Persistent cough.  Getting tired much faster than expected.  Swelling in the abdomen, feet, or ankles.  How is this diagnosed? This condition may be diagnosed during a routine physical or other exam. If your health care provider hears a murmur with a stethoscope, he or she will listen for:  Where the murmur is located in your heart.  How long the murmur lasts (duration).  When the murmur is heard during the heartbeat.  How loud the murmur is. This may help the health care provider figure out what is causing the murmur.  You may be referred to a heart specialist (cardiologist). You may also have other tests, including:  Electrocardiogram (ECG or EKG). This test measures the electrical activity of your heart.  Echocardiogram. This test uses high frequency sound waves to make pictures of your heart.  MRI or chest X-ray.  Cardiac catheterization. This test looks at blood flow through the heart.  For children and adults who have an abnormal heart murmur and want to stay active, it is important to complete testing, review test results, and receive recommendations from your health care   provider. If heart disease is present, it may not be safe to play or be active. How is this treated? Heart murmurs themselves do not need treatment. In some cases, a heart murmur may go away on its own. If an underlying problem or disease is causing the murmur, you may need treatment. If treatment is needed, it will depend on the type and severity of the disease or heart problem causing  the murmur. Treatment may include:  Medicine.  Surgery.  Dietary and lifestyle changes.  Follow these instructions at home:  Talk with your health care provider before participating in sports or other activities that require a lot of effort and energy (are strenuous).  Learn as much as possible about your condition and any related diseases. Ask your health care provider if you may at risk for any medical emergencies.  Talk with your health care provider about what symptoms you should look out for.  It is up to you to get your test results. Ask your health care provider, or the department that is doing the test, when your results will be ready.  Keep all follow-up visits as told by your health care provider. This is important. Contact a health care provider if:  You feel light-headed.  You are frequently short of breath.  You feel more tired than usual.  You are having a hard time keeping up with normal activities or fitness routines.  You have swelling in your ankles or feet.  You have chest pain.  You notice that your heart often beats irregularly.  You develop any new symptoms. Get help right away if:  You develop severe chest pain.  You are having trouble breathing.  You have fainting spells.  Your symptoms suddenly get worse. These symptoms may represent a serious problem that is an emergency. Do not wait to see if the symptoms will go away. Get medical help right away. Call your local emergency services (911 in the [place] Do not drive yourself to the hospital. Summary  Normally, the heart valves open to let blood flow through or out of your heart, and then they shut to keep the blood from flowing backward.  Heart murmur is caused by heart valves that are not working properly.  You may need treatment if an underlying problem or disease is causing the heart murmur. Treatment may include medicine, surgery, or dietary and lifestyle changes.  Talk with your  health care provider before participating in sports or other activities that require a lot of effort and energy (are strenuous).  Talk with your health care provider about what symptoms you should watch out for. This information is not intended to replace advice given to you by your health care provider. Make sure you discuss any questions you have with your health care provider. Document Released: 04/18/2004 Document Revised: 02/28/2016 Document Reviewed: 02/28/2016 Elsevier Interactive Patient Education  2018 [employer]  

## 2018-03-03 DIAGNOSIS — R011 Cardiac murmur, unspecified: Secondary | ICD-10-CM | POA: Insufficient documentation

## 2018-03-03 DIAGNOSIS — I340 Nonrheumatic mitral (valve) insufficiency: Secondary | ICD-10-CM | POA: Insufficient documentation

## 2018-03-03 LAB — BMP8+EGFR
BUN/Creatinine Ratio: 17 (ref 12–28)
BUN: 10 mg/dL (ref 8–27)
CALCIUM: 9.4 mg/dL (ref 8.7–10.3)
CO2: 24 mmol/L (ref 20–29)
Chloride: 102 mmol/L (ref 96–106)
Creatinine, Ser: 0.6 mg/dL (ref 0.57–1.00)
GFR calc Af Amer: 105 mL/min/{1.73_m2} (ref >59)
GFR calc non Af Amer: 91 mL/min/{1.73_m2} (ref >59)
Glucose: 111 mg/dL — ABNORMAL HIGH (ref 65–99)
Potassium: 3.7 mmol/L (ref 3.5–5.2)
Sodium: 141 mmol/L (ref 134–144)

## 2018-03-03 LAB — CBC WITH DIFFERENTIAL/PLATELET
Basophils Absolute: 0.1 10*3/uL (ref 0.0–0.2)
Basos: 1 %
EOS (ABSOLUTE): 0.2 10*3/uL (ref 0.0–0.4)
EOS: 2 %
Hematocrit: 40.3 % (ref 34.0–46.6)
Hemoglobin: 13.3 g/dL (ref 11.1–15.9)
Immature Grans (Abs): 0 10*3/uL (ref 0.0–0.1)
Immature Granulocytes: 0 %
Lymphocytes Absolute: 1.8 10*3/uL (ref 0.7–3.1)
Lymphs: 18 %
MCH: 29.1 pg (ref 26.6–33.0)
MCHC: 33 g/dL (ref 31.5–35.7)
MCV: 88 fL (ref 79–97)
Monocytes Absolute: 0.9 10*3/uL (ref 0.1–0.9)
Monocytes: 9 %
Neutrophils Absolute: 7 10*3/uL (ref 1.4–7.0)
Neutrophils: 70 %
Platelets: 235 10*3/uL (ref 150–450)
RBC: 4.57 x10E6/uL (ref 3.77–5.28)
RDW: 12.7 % (ref 12.3–15.4)
WBC: 9.9 10*3/uL (ref 3.4–10.8)

## 2018-03-03 LAB — TSH: TSH: 0.938 u[IU]/mL (ref 0.450–4.500)

## 2018-03-30 DIAGNOSIS — R062 Wheezing: Secondary | ICD-10-CM | POA: Diagnosis not present

## 2018-03-30 DIAGNOSIS — J40 Bronchitis, not specified as acute or chronic: Secondary | ICD-10-CM | POA: Diagnosis not present

## 2018-03-30 DIAGNOSIS — R05 Cough: Secondary | ICD-10-CM | POA: Diagnosis not present

## 2018-04-21 ENCOUNTER — Ambulatory Visit (INDEPENDENT_AMBULATORY_CARE_PROVIDER_SITE_OTHER): Payer: BLUE CROSS/BLUE SHIELD

## 2018-04-21 ENCOUNTER — Encounter: Payer: Self-pay | Admitting: Family Medicine

## 2018-04-21 ENCOUNTER — Ambulatory Visit (INDEPENDENT_AMBULATORY_CARE_PROVIDER_SITE_OTHER): Payer: BLUE CROSS/BLUE SHIELD | Admitting: Family Medicine

## 2018-04-21 ENCOUNTER — Other Ambulatory Visit: Payer: Self-pay | Admitting: Family Medicine

## 2018-04-21 VITALS — BP 119/63 | HR 68 | Temp 98.1°F | Ht 66.0 in | Wt 197.0 lb

## 2018-04-21 DIAGNOSIS — R053 Chronic cough: Secondary | ICD-10-CM

## 2018-04-21 DIAGNOSIS — N898 Other specified noninflammatory disorders of vagina: Secondary | ICD-10-CM

## 2018-04-21 DIAGNOSIS — R5383 Other fatigue: Secondary | ICD-10-CM | POA: Diagnosis not present

## 2018-04-21 DIAGNOSIS — R05 Cough: Secondary | ICD-10-CM | POA: Diagnosis not present

## 2018-04-21 DIAGNOSIS — Z780 Asymptomatic menopausal state: Secondary | ICD-10-CM

## 2018-04-21 DIAGNOSIS — M85852 Other specified disorders of bone density and structure, left thigh: Secondary | ICD-10-CM | POA: Diagnosis not present

## 2018-04-21 DIAGNOSIS — Z01411 Encounter for gynecological examination (general) (routine) with abnormal findings: Secondary | ICD-10-CM | POA: Diagnosis not present

## 2018-04-21 DIAGNOSIS — Z01419 Encounter for gynecological examination (general) (routine) without abnormal findings: Secondary | ICD-10-CM

## 2018-04-21 LAB — WET PREP FOR TRICH, YEAST, CLUE
Clue Cell Exam: NEGATIVE
Trichomonas Exam: NEGATIVE
Yeast Exam: POSITIVE — AB

## 2018-04-21 MED ORDER — FLUCONAZOLE 150 MG PO TABS: 150.0000 mg | ORAL_TABLET | Freq: Once | ORAL | 0 refills | Status: AC

## 2018-04-21 MED ORDER — ROSUVASTATIN CALCIUM 10 MG PO CPSP: 10.0000 mg | ORAL_CAPSULE | Freq: Every day | ORAL | 3 refills | Status: DC

## 2018-04-21 MED ORDER — LISINOPRIL-HYDROCHLOROTHIAZIDE 20-12.5 MG PO TABS: 1.0000 | ORAL_TABLET | Freq: Every day | ORAL | 3 refills | Status: DC

## 2018-04-21 NOTE — Progress Notes (Signed)
Stacey Reyes is a 73 y.o. female presents to office today for annual physical exam examination.    Concerns today include: 1.  Fatigue Patient reports ongoing fatigue and intermittent persistent cough that has been ongoing since she was diagnosed with bronchitis in December.  She is status post treatment with antibiotics.  She denies any hemoptysis, fevers, night sweats or unplanned weight loss.  She is a former smoker but quit over 40 years ago.  No history of COPD or asthma.  She is not currently on any oral antihistamines.  She does report postnasal drip and drainage.  She reports associated throat irritation.  She reports some shortness of breath during coughing spells but otherwise no change in exercise tolerance.  No extremity edema or orthopnea.  Marital status: married, Substance use: former smoker Diet: sufficient calcium and vit d, Exercise: tolerates normally Last eye exam: sees yearly Last dental exam: sees regularly Last colonoscopy: DUE Dexa scan: DUE Last mammogram: UTD Refills needed today: BP and cholesterol meds Immunizations needed: UTD  Past Medical History:  Diagnosis Date  . Chronic cystitis   . Hyperlipidemia   . Hypertension   . Personal history of colonic polyps-adenomas 01/07/2012   2009 - 2 diminutive adenomas (prior polyps also) 01/07/2012 - 2 diminutive adenomas     Social History   Socioeconomic History  . Marital status: Married    Spouse name: Not on file  . Number of children: Not on file  . Years of education: Not on file  . Highest education level: Not on file  Occupational History  . Not on file  Social Needs  . Financial resource strain: Not on file  . Food insecurity:    Worry: Not on file    Inability: Not on file  . Transportation needs:    Medical: Not on file    Non-medical: Not on file  Tobacco Use  . Smoking status: Former Smoker    Last attempt to quit: 12/23/1984    Years since quitting: 33.3  . Smokeless tobacco:  Never Used  Substance and Sexual Activity  . Alcohol use: No  . Drug use: No  . Sexual activity: Not on file  Lifestyle  . Physical activity:    Days per week: Not on file    Minutes per session: Not on file  . Stress: Not on file  Relationships  . Social connections:    Talks on phone: Not on file    Gets together: Not on file    Attends religious service: Not on file    Active member of club or organization: Not on file    Attends meetings of clubs or organizations: Not on file    Relationship status: Not on file  . Intimate partner violence:    Fear of current or ex partner: Not on file    Emotionally abused: Not on file    Physically abused: Not on file    Forced sexual activity: Not on file  Other Topics Concern  . Not on file  Social History Narrative  . Not on file   Past Surgical History:  Procedure Laterality Date  . BREAST BIOPSY Right    No Scar seen   . BREAST SURGERY     breast biopsy/ right /benign  . COLONOSCOPY  multiple   Family History  Problem Relation Age of Onset  . Colon cancer Mother 18       80's  . Breast cancer Sister  Current Outpatient Medications:  .  aspirin 81 MG tablet, Take 81 mg by mouth daily., Disp: , Rfl:  .  cholecalciferol (VITAMIN D) 1000 units tablet, Take 1,000 Units by mouth daily., Disp: , Rfl:  .  lisinopril-hydrochlorothiazide (PRINZIDE,ZESTORETIC) 20-12.5 MG per tablet, TAKE 1 TABLET DAILY, Disp: 30 tablet, Rfl: 0 .  metroNIDAZOLE (FLAGYL) 500 MG tablet, Take 1 tablet (500 mg total) by mouth 3 (three) times daily., Disp: 21 tablet, Rfl: 0 .  Rosuvastatin Calcium 10 MG CPSP, Take 10 mg by mouth., Disp: , Rfl:  .  thiamine (VITAMIN B-1) 100 MG tablet, Take 100 mg by mouth daily., Disp: , Rfl:  .  vitamin E 400 UNIT capsule, Take 400 Units by mouth. Take 2 daily, Disp: , Rfl:   Allergies  Allergen Reactions  . Sulfur Rash     ROS: Review of Systems Constitutional: negative Eyes: negative Ears, nose, mouth,  throat, and face: negative Respiratory: negative Cardiovascular: negative Gastrointestinal: negative Genitourinary:positive for vaginal discharge. Integument/breast: negative Hematologic/lymphatic: negative Musculoskeletal:negative Neurological: negative Behavioral/Psych: negative Endocrine: negative Allergic/Immunologic: negative    Physical exam BP 119/63   Pulse 68   Temp 98.1 F (36.7 C) (Oral)   Ht '5\' 6"'$  (1.676 m)   Wt 197 lb (89.4 kg)   BMI 31.80 kg/m  General appearance: alert, cooperative, appears stated age, no distress and mildly obese Head: Normocephalic, without obvious abnormality, atraumatic Eyes: negative findings: lids and lashes normal, conjunctivae and sclerae normal, corneas clear and pupils equal, round, reactive to light and accomodation Ears: normal TM's and external ear canals both ears Nose: clear drainage noted. Throat: lips, mucosa, and tongue normal; teeth and gums normal Neck: no adenopathy, no carotid bruit, no JVD, supple, symmetrical, trachea midline and thyroid not enlarged, symmetric, no tenderness/mass/nodules Back: symmetric, no curvature. ROM normal. No CVA tenderness. Lungs: clear to auscultation bilaterally Heart: regular rate and rhythm, S1, S2 normal, 2/6 systolic murmur at left sternal border. NO click, rub or gallop Abdomen: soft, non-tender; bowel sounds normal; no masses,  no organomegaly Pelvic: cervix normal in appearance, external genitalia normal, no adnexal masses or tenderness, no cervical motion tenderness, rectovaginal septum normal, uterus normal size, shape, and consistency and white milky discharge noted Extremities: extremities normal, atraumatic, no cyanosis or edema Pulses: 2+ and symmetric Skin: Skin color, texture, turgor normal. No rashes or lesions Lymph nodes: Cervical, supraclavicular, and axillary nodes normal. Neurologic: Grossly normal Psych: Mood stable, speech strong, affect appropriate, pleasant and  interactive Depression screen [facility] 2/9 04/21/2018 03/02/2018 10/16/2017  Decreased Interest 0 0 0  Down, Depressed, Hopeless 0 0 0  PHQ - 2 Score 0 0 0  Altered sleeping 2 - -  Tired, decreased energy 3 - -  Change in appetite 1 - -  Feeling bad or failure about yourself  0 - -  Trouble concentrating 0 - -  Moving slowly or fidgety/restless 0 - -  Suicidal thoughts 0 - -  PHQ-9 Score 6 - -   No results found.  Assessment/ Plan: [name] here for annual physical exam.   1. Well woman exam with routine gynecological exam She will scheduled colonoscopy.    2. Asymptomatic postmenopausal estrogen deficiency - DG [facility] DEXA; Future  3. Vaginal discharge Likely physiologic.  Will send for testing - WET PREP FOR TRICH, YEAST, CLUE - GC/Chlamydia Probe Amp  4. Fatigue, unspecified type Possibly related to recent illness.  Will evaluate for metabolic causes - CBC - TSH - CMP14+EGFR  5. Persistent  cough for 3 weeks or longer Chest x-ray was ordered and upon personal review demonstrated hyperinflated lungs suggestive of COPD.  No acute processes appreciated.  Patient was a smoker many years ago but may benefit from an inhaler.  We will plan to discuss this further at her next visit. - DG Chest 2 View; Future  Handout provided on healthy lifestyle choices, including diet (rich in fruits, vegetables and lean meats and low in salt and simple carbohydrates) and exercise (at least 30 minutes of moderate physical activity daily).  Patient to follow up in 1 year for annual exam or sooner if needed.  Stacey M. Lajuana Ripple, DO

## 2018-04-21 NOTE — Patient Instructions (Signed)
You had labs performed today.  You will be contacted with the results of the labs once they are available, usually in the next 3 business days for routine lab work.    You had your bone density test today.  Please schedule your colonoscopy.  You are due.  Start claritin daily for the post nasal drainage.  Health Maintenance, Female Adopting a healthy lifestyle and getting preventive care can go a long way to promote health and wellness. Talk with your health care provider about what schedule of regular examinations is right for you. This is a good chance for you to check in with your provider about disease prevention and staying healthy. In between checkups, there are plenty of things you can do on your own. Experts have done a lot of research about which lifestyle changes and preventive measures are most likely to keep you healthy. Ask your health care provider for more information. Weight and diet Eat a healthy diet  Be sure to include plenty of vegetables, fruits, low-fat dairy products, and lean protein.  Do not eat a lot of foods high in solid fats, added sugars, or salt.  Get regular exercise. This is one of the most important things you can do for your health. ? Most adults should exercise for at least 150 minutes each week. The exercise should increase your heart rate and make you sweat (moderate-intensity exercise). ? Most adults should also do strengthening exercises at least twice a week. This is in addition to the moderate-intensity exercise. Maintain a healthy weight  Body mass index (BMI) is a measurement that can be used to identify possible weight problems. It estimates body fat based on height and weight. Your health care provider can help determine your BMI and help you achieve or maintain a healthy weight.  For females 65 years of age and older: ? A BMI below 18.5 is considered underweight. ? A BMI of 18.5 to 24.9 is normal. ? A BMI of 25 to 29.9 is considered  overweight. ? A BMI of 30 and above is considered obese. Watch levels of cholesterol and blood lipids  You should start having your blood tested for lipids and cholesterol at 74 years of age, then have this test every 5 years.  You may need to have your cholesterol levels checked more often if: ? Your lipid or cholesterol levels are high. ? You are older than 74 years of age. ? You are at high risk for heart disease. Cancer screening Lung Cancer  Lung cancer screening is recommended for adults 41-29 years old who are at high risk for lung cancer because of a history of smoking.  A yearly low-dose CT scan of the lungs is recommended for people who: ? Currently smoke. ? Have quit within the past 15 years. ? Have at least a 30-pack-year history of smoking. A pack year is smoking an average of one pack of cigarettes a day for 1 year.  Yearly screening should continue until it has been 15 years since you quit.  Yearly screening should stop if you develop a health problem that would prevent you from having lung cancer treatment. Breast Cancer  Practice breast self-awareness. This means understanding how your breasts normally appear and feel.  It also means doing regular breast self-exams. Let your health care provider know about any changes, no matter how small.  If you are in your 20s or 30s, you should have a clinical breast exam (CBE) by a health care provider every  1-3 years as part of a regular health exam.  If you are 82 or older, have a CBE every year. Also consider having a breast X-ray (mammogram) every year.  If you have a family history of breast cancer, talk to your health care provider about genetic screening.  If you are at high risk for breast cancer, talk to your health care provider about having an MRI and a mammogram every year.  Breast cancer gene (BRCA) assessment is recommended for women who have family members with BRCA-related cancers. BRCA-related cancers  include: ? Breast. ? Ovarian. ? Tubal. ? Peritoneal cancers.  Results of the assessment will determine the need for genetic counseling and BRCA1 and BRCA2 testing. Cervical Cancer Your health care provider may recommend that you be screened regularly for cancer of the pelvic organs (ovaries, uterus, and vagina). This screening involves a pelvic examination, including checking for microscopic changes to the surface of your cervix (Pap test). You may be encouraged to have this screening done every 3 years, beginning at age 19.  For women ages 49-65, health care providers may recommend pelvic exams and Pap testing every 3 years, or they may recommend the Pap and pelvic exam, combined with testing for human papilloma virus (HPV), every 5 years. Some types of HPV increase your risk of cervical cancer. Testing for HPV may also be done on women of any age with unclear Pap test results.  Other health care providers may not recommend any screening for nonpregnant women who are considered low risk for pelvic cancer and who do not have symptoms. Ask your health care provider if a screening pelvic exam is right for you.  If you have had past treatment for cervical cancer or a condition that could lead to cancer, you need Pap tests and screening for cancer for at least 20 years after your treatment. If Pap tests have been discontinued, your risk factors (such as having a new sexual partner) need to be reassessed to determine if screening should resume. Some women have medical problems that increase the chance of getting cervical cancer. In these cases, your health care provider may recommend more frequent screening and Pap tests. Colorectal Cancer  This type of cancer can be detected and often prevented.  Routine colorectal cancer screening usually begins at 74 years of age and continues through 74 years of age.  Your health care provider may recommend screening at an earlier age if you have risk factors for  colon cancer.  Your health care provider may also recommend using home test kits to check for hidden blood in the stool.  A small camera at the end of a tube can be used to examine your colon directly (sigmoidoscopy or colonoscopy). This is done to check for the earliest forms of colorectal cancer.  Routine screening usually begins at age 36.  Direct examination of the colon should be repeated every 5-10 years through 74 years of age. However, you may need to be screened more often if early forms of precancerous polyps or small growths are found. Skin Cancer  Check your skin from head to toe regularly.  Tell your health care provider about any new moles or changes in moles, especially if there is a change in a mole's shape or color.  Also tell your health care provider if you have a mole that is larger than the size of a pencil eraser.  Always use sunscreen. Apply sunscreen liberally and repeatedly throughout the day.  Protect yourself by wearing  long sleeves, pants, a wide-brimmed hat, and sunglasses whenever you are outside. Heart disease, diabetes, and high blood pressure  High blood pressure causes heart disease and increases the risk of stroke. High blood pressure is more likely to develop in: ? People who have blood pressure in the high end of the normal range (130-139/85-89 mm Hg). ? People who are overweight or obese. ? People who are African American.  If you are 85-76 years of age, have your blood pressure checked every 3-5 years. If you are 4 years of age or older, have your blood pressure checked every year. You should have your blood pressure measured twice-once when you are at a hospital or clinic, and once when you are not at a hospital or clinic. Record the average of the two measurements. To check your blood pressure when you are not at a hospital or clinic, you can use: ? An automated blood pressure machine at a pharmacy. ? A home blood pressure monitor.  If you are  between 62 years and 54 years old, ask your health care provider if you should take aspirin to prevent strokes.  Have regular diabetes screenings. This involves taking a blood sample to check your fasting blood sugar level. ? If you are at a normal weight and have a low risk for diabetes, have this test once every three years after 74 years of age. ? If you are overweight and have a high risk for diabetes, consider being tested at a younger age or more often. Preventing infection Hepatitis B  If you have a higher risk for hepatitis B, you should be screened for this virus. You are considered at high risk for hepatitis B if: ? You were born in a country where hepatitis B is common. Ask your health care provider which countries are considered high risk. ? Your parents were born in a high-risk country, and you have not been immunized against hepatitis B (hepatitis B vaccine). ? You have HIV or AIDS. ? You use needles to inject street drugs. ? You live with someone who has hepatitis B. ? You have had sex with someone who has hepatitis B. ? You get hemodialysis treatment. ? You take certain medicines for conditions, including cancer, organ transplantation, and autoimmune conditions. Hepatitis C  Blood testing is recommended for: ? Everyone born from [birth year] through 1966. ? Anyone with known risk factors for hepatitis C. Sexually transmitted infections (STIs)  You should be screened for sexually transmitted infections (STIs) including gonorrhea and chlamydia if: ? You are sexually active and are younger than 74 years of age. ? You are older than 74 years of age and your health care provider tells you that you are at risk for this type of infection. ? Your sexual activity has changed since you were last screened and you are at an increased risk for chlamydia or gonorrhea. Ask your health care provider if you are at risk.  If you do not have HIV, but are at risk, it may be recommended that you take  a prescription medicine daily to prevent HIV infection. This is called pre-exposure prophylaxis (PrEP). You are considered at risk if: ? You are sexually active and do not regularly use condoms or know the HIV status of your partner(s). ? You take drugs by injection. ? You are sexually active with a partner who has HIV. Talk with your health care provider about whether you are at high risk of being infected with HIV. If you choose to  begin PrEP, you should first be tested for HIV. You should then be tested every 3 months for as long as you are taking PrEP. Pregnancy  If you are premenopausal and you may become pregnant, ask your health care provider about preconception counseling.  If you may become pregnant, take 400 to 800 micrograms (mcg) of folic acid every day.  If you want to prevent pregnancy, talk to your health care provider about birth control (contraception). Osteoporosis and menopause  Osteoporosis is a disease in which the bones lose minerals and strength with aging. This can result in serious bone fractures. Your risk for osteoporosis can be identified using a bone density scan.  If you are 76 years of age or older, or if you are at risk for osteoporosis and fractures, ask your health care provider if you should be screened.  Ask your health care provider whether you should take a calcium or vitamin D supplement to lower your risk for osteoporosis.  Menopause may have certain physical symptoms and risks.  Hormone replacement therapy may reduce some of these symptoms and risks. Talk to your health care provider about whether hormone replacement therapy is right for you. Follow these instructions at home:  Schedule regular health, dental, and eye exams.  Stay current with your immunizations.  Do not use any tobacco products including cigarettes, chewing tobacco, or electronic cigarettes.  If you are pregnant, do not drink alcohol.  If you are breastfeeding, limit how  much and how often you drink alcohol.  Limit alcohol intake to no more than 1 drink per day for nonpregnant women. One drink equals 12 ounces of beer, 5 ounces of wine, or 1 ounces of hard liquor.  Do not use street drugs.  Do not share needles.  Ask your health care provider for help if you need support or information about quitting drugs.  Tell your health care provider if you often feel depressed.  Tell your health care provider if you have ever been abused or do not feel safe at home. This information is not intended to replace advice given to you by your health care provider. Make sure you discuss any questions you have with your health care provider. Document Released: 09/24/2010 Document Revised: 08/17/2015 Document Reviewed: 12/13/2014 Elsevier Interactive Patient Education  2019 [employer]  Postnasal Drip Postnasal drip is the feeling of mucus going down the back of your throat. Mucus is a slimy substance that moistens and cleans your nose and throat, as well as the air pockets in face bones near your forehead and cheeks (sinuses). Small amounts of mucus pass from your nose and sinuses down the back of your throat all the time. This is normal. When you produce too much mucus or the mucus gets too thick, you can feel it. Some common causes of postnasal drip include:  Having more mucus because of: ? A cold or the flu. ? Allergies. ? Cold air. ? Certain medicines.  Having more mucus that is thicker because of: ? A sinus or nasal infection. ? Dry air. ? A food allergy. Follow these instructions at home: Relieving discomfort   Gargle with a salt-water mixture 3-4 times a day or as needed. To make a salt-water mixture, completely dissolve -1 tsp of salt in 1 cup of warm water.  If the air in your home is dry, use a humidifier to add moisture to the air.  Use a saline spray or container (neti pot) to flush out the nose (nasal irrigation).  These methods can help clear  away mucus and keep the nasal passages moist. General instructions  Take over-the-counter and prescription medicines only as told by your health care provider.  Follow instructions from your health care provider about eating or drinking restrictions. You may need to avoid caffeine.  Avoid things that you know you are allergic to (allergens), like dust, mold, pollen, pets, or certain foods.  Drink enough fluid to keep your urine pale yellow.  Keep all follow-up visits as told by your health care provider. This is important. Contact a health care provider if:  You have a fever.  You have a sore throat.  You have difficulty swallowing.  You have headache.  You have sinus pain.  You have a cough that does not go away.  The mucus from your nose becomes thick and is green or yellow in color.  You have cold or flu symptoms that last more than 10 days. Summary  Postnasal drip is the feeling of mucus going down the back of your throat.  If your health care provider approves, use nasal irrigation or a nasal spray 2?4 times a day.  Avoid things that you know you are allergic to (allergens), like dust, mold, pollen, pets, or certain foods. This information is not intended to replace advice given to you by your health care provider. Make sure you discuss any questions you have with your health care provider. Document Released: 06/24/2016 Document Revised: 06/24/2016 Document Reviewed: 06/24/2016 Elsevier Interactive Patient Education  2019 [employer].

## 2018-04-22 ENCOUNTER — Encounter: Payer: Self-pay | Admitting: Family Medicine

## 2018-04-22 DIAGNOSIS — Z780 Asymptomatic menopausal state: Secondary | ICD-10-CM | POA: Insufficient documentation

## 2018-04-22 DIAGNOSIS — M8580 Other specified disorders of bone density and structure, unspecified site: Secondary | ICD-10-CM | POA: Insufficient documentation

## 2018-04-22 DIAGNOSIS — M810 Age-related osteoporosis without current pathological fracture: Secondary | ICD-10-CM

## 2018-04-23 LAB — GC/CHLAMYDIA PROBE AMP
Chlamydia trachomatis, NAA: NEGATIVE
Neisseria gonorrhoeae by PCR: NEGATIVE

## 2018-09-09 DIAGNOSIS — K5730 Diverticulosis of large intestine without perforation or abscess without bleeding: Secondary | ICD-10-CM | POA: Diagnosis not present

## 2018-09-09 DIAGNOSIS — E785 Hyperlipidemia, unspecified: Secondary | ICD-10-CM | POA: Diagnosis not present

## 2018-09-09 DIAGNOSIS — Z008 Encounter for other general examination: Secondary | ICD-10-CM | POA: Diagnosis not present

## 2018-09-09 DIAGNOSIS — I10 Essential (primary) hypertension: Secondary | ICD-10-CM | POA: Diagnosis not present

## 2018-09-28 DIAGNOSIS — L298 Other pruritus: Secondary | ICD-10-CM | POA: Diagnosis not present

## 2018-10-14 ENCOUNTER — Other Ambulatory Visit: Payer: Self-pay

## 2018-10-14 ENCOUNTER — Encounter (HOSPITAL_BASED_OUTPATIENT_CLINIC_OR_DEPARTMENT_OTHER): Payer: Self-pay

## 2018-10-14 ENCOUNTER — Emergency Department (HOSPITAL_BASED_OUTPATIENT_CLINIC_OR_DEPARTMENT_OTHER)
Admission: EM | Admit: 2018-10-14 | Discharge: 2018-10-14 | Disposition: A | Discharge status: Home / Self Care | Payer: BC Managed Care – PPO | Attending: Emergency Medicine | Admitting: Emergency Medicine

## 2018-10-14 DIAGNOSIS — Z79899 Other long term (current) drug therapy: Secondary | ICD-10-CM | POA: Diagnosis not present

## 2018-10-14 DIAGNOSIS — Y92017 Garden or yard in single-family (private) house as the place of occurrence of the external cause: Secondary | ICD-10-CM | POA: Insufficient documentation

## 2018-10-14 DIAGNOSIS — Y93H2 Activity, gardening and landscaping: Secondary | ICD-10-CM | POA: Insufficient documentation

## 2018-10-14 DIAGNOSIS — Y998 Other external cause status: Secondary | ICD-10-CM | POA: Diagnosis not present

## 2018-10-14 DIAGNOSIS — I10 Essential (primary) hypertension: Secondary | ICD-10-CM | POA: Insufficient documentation

## 2018-10-14 DIAGNOSIS — S0501XA Injury of conjunctiva and corneal abrasion without foreign body, right eye, initial encounter: Secondary | ICD-10-CM | POA: Insufficient documentation

## 2018-10-14 DIAGNOSIS — X58XXXA Exposure to other specified factors, initial encounter: Secondary | ICD-10-CM | POA: Diagnosis not present

## 2018-10-14 DIAGNOSIS — S0591XA Unspecified injury of right eye and orbit, initial encounter: Secondary | ICD-10-CM | POA: Diagnosis present

## 2018-10-14 DIAGNOSIS — Z87891 Personal history of nicotine dependence: Secondary | ICD-10-CM | POA: Insufficient documentation

## 2018-10-14 MED ORDER — ERYTHROMYCIN 5 MG/GM OP OINT
TOPICAL_OINTMENT | Freq: Once | OPHTHALMIC | Status: AC
  Administered 2018-10-14: 20:00:00 via OPHTHALMIC
  Filled 2018-10-14: qty 3.5

## 2018-10-14 MED ORDER — TETRACAINE HCL 0.5 % OP SOLN
2.0000 [drp] | Freq: Once | OPHTHALMIC | Status: AC
  Administered 2018-10-14: 2 [drp] via OPHTHALMIC
  Filled 2018-10-14: qty 4

## 2018-10-14 MED ORDER — FLUORESCEIN SODIUM 1 MG OP STRP
1.0000 | ORAL_STRIP | Freq: Once | OPHTHALMIC | Status: AC
  Administered 2018-10-14: 1 via OPHTHALMIC
  Filled 2018-10-14: qty 1

## 2018-10-14 NOTE — Discharge Instructions (Signed)
You were seen in the ED today for right eye pain Found to have a corneal abrasion Please use antibiotic ointment 4x daily for the next week  Please follow up with your PCP Return to the ED for any worsening symptoms

## 2018-10-14 NOTE — ED Triage Notes (Signed)
Pt c/o ?fb right eye after mowing yard today-NAD-steady gait

## 2018-10-14 NOTE — ED Provider Notes (Addendum)
[facility] EMERGENCY DEPARTMENT Provider Note   CSN: [id number] Arrival date & time: 10/14/18  [place]    History   Chief Complaint Chief Complaint  Patient presents with  . Foreign Body in Eye    HPI Stacey Reyes is a 73 y.o. female who presents to the ED complaining of sudden onset, constant, scratchy, FB sensation to right eye that began earlier today. Pt reports that she was mowing her yard today and when she went to shower she felt the sensation to her eye. She attempted OTC eye drops without relief. Denies vision changes, headache, unilateral weakness or numbness, speech difficulties, or any other associated symptoms.        Past Medical History:  Diagnosis Date  . Chronic cystitis   . Hyperlipidemia   . Hypertension   . Personal history of colonic polyps-adenomas 01/07/2012   2009 - 2 diminutive adenomas (prior polyps also) 01/07/2012 - 2 diminutive adenomas      Patient Active Problem List   Diagnosis Date Noted  . Osteopenia after menopause 04/22/2018  . Cardiac murmur due to mitral valve disorder 03/03/2018  . Heart murmur 03/03/2018  . LVH (left ventricular hypertrophy) 11/20/2015  . Arrhythmia 10/11/2015  . Essential hypertension 02/24/2015  . Hyperlipidemia 02/24/2015  . Personal history of colonic polyps-adenomas 01/07/2012    Past Surgical History:  Procedure Laterality Date  . BREAST BIOPSY Right    No Scar seen   . BREAST SURGERY     breast biopsy/ right /benign  . COLONOSCOPY  multiple     OB History   No obstetric history on file.      Home Medications    Prior to Admission medications   Medication Sig Start Date End Date Taking? Authorizing Provider  aspirin 81 MG tablet Take 81 mg by mouth daily.    [provider]  lisinopril-hydrochlorothiazide (PRINZIDE,ZESTORETIC) 20-12.5 MG tablet Take 1 tablet by mouth daily. 04/21/18   Ronnie Doss M, DO  Rosuvastatin Calcium 10 MG CPSP Take 10 mg by mouth at  bedtime. 04/21/18   [name], DO  thiamine (VITAMIN B-1) 100 MG tablet Take 100 mg by mouth daily.    [provider]  vitamin E 400 UNIT capsule Take 400 Units by mouth. Take 2 daily    [provider]    Family History Family History  Problem Relation Age of Onset  . Colon cancer Mother 20       80's  . Breast cancer Sister     Social History Social History   Tobacco Use  . Smoking status: Former Smoker    Quit date: 12/23/1984    Years since quitting: 33.8  . Smokeless tobacco: Never Used  Substance Use Topics  . Alcohol use: No  . Drug use: No     Allergies   Sulfur   Review of Systems Review of Systems  Constitutional: Negative for chills and fever.  Eyes: Positive for pain and redness. Negative for visual disturbance.  Neurological: Negative for headaches.     Physical Exam Updated Vital Signs BP 127/79 (BP Location: Left Arm)   Pulse 84   Temp 98.2 F (36.8 C) (Oral)   Resp 18   Ht 5\' 6"  (1.676 m)   Wt 88.9 kg   SpO2 99%   BMI 31.64 kg/m   Physical Exam Vitals signs and nursing note reviewed.  Constitutional:      Appearance: She is not ill-appearing.  HENT:  Head: Normocephalic and atraumatic.  Eyes:     General: Lids are normal.        Right eye: No foreign body.        Left eye: No foreign body.     Extraocular Movements: Extraocular movements intact.     Conjunctiva/sclera:     Right eye: Right conjunctiva is injected.     Pupils:     Right eye: Corneal abrasion present.   Cardiovascular:     Rate and Rhythm: Normal rate and regular rhythm.  Pulmonary:     Effort: Pulmonary effort is normal.     Breath sounds: Normal breath sounds.  Skin:    General: Skin is warm and dry.     Coloration: Skin is not jaundiced.  Neurological:     Mental Status: She is alert.      ED Treatments / Results  Labs (all labs ordered are listed, but only abnormal results are displayed) Labs Reviewed - No data to  display  EKG None  Radiology No results found.  Procedures Procedures (including critical care time)  Medications Ordered in ED Medications  fluorescein ophthalmic strip 1 strip (1 strip Right Eye Given 10/14/18 1913)  tetracaine (PONTOCAINE) 0.5 % ophthalmic solution 2 drop (2 drops Right Eye Given 10/14/18 1913)  erythromycin ophthalmic ointment ( Right Eye Given 10/14/18 1935)     Initial Impression / Assessment and Plan / ED Course  I have reviewed the triage vital signs and the nursing notes.  Pertinent labs & imaging results that were available during my care of the patient were reviewed by me and considered in my medical decision making (see chart for details).    73 year old female presenting to the ED with FB sensation to right eye after mowing yard. No other concerning symptoms including vision changes or focal neuro deficits. PERRL. No concern for acute angle closure glaucoma. Pt does not wear contacts at home. Eye stained and corneal abrasion appreciated to 9 o'clock at right eye. Will get visual acuity today and discharge patient home with erythromycin drops.   Visual acuity today within normal limits. Pt advised to use eyedrops as prescribed and follow up with PCP. She is in agreement with plan at this time and stable for discharge home.        Final Clinical Impressions(s) / ED Diagnoses   Final diagnoses:  Abrasion of right cornea, initial encounter    ED Discharge Orders    None       Eustaquio Maize, PA-C 10/14/18 2121    Eustaquio Maize, PA-C 10/14/18 2121    Veryl Speak, MD 10/14/18 2141

## 2018-10-19 DIAGNOSIS — R7309 Other abnormal glucose: Secondary | ICD-10-CM | POA: Diagnosis not present

## 2018-10-19 DIAGNOSIS — K921 Melena: Secondary | ICD-10-CM | POA: Diagnosis not present

## 2018-10-19 DIAGNOSIS — R5383 Other fatigue: Secondary | ICD-10-CM | POA: Diagnosis not present

## 2018-10-21 ENCOUNTER — Telehealth: Payer: Self-pay | Admitting: Internal Medicine

## 2018-10-21 DIAGNOSIS — D649 Anemia, unspecified: Secondary | ICD-10-CM | POA: Diagnosis not present

## 2018-10-21 DIAGNOSIS — K921 Melena: Secondary | ICD-10-CM | POA: Diagnosis not present

## 2018-10-21 DIAGNOSIS — R5383 Other fatigue: Secondary | ICD-10-CM | POA: Diagnosis not present

## 2018-10-21 NOTE — Telephone Encounter (Signed)
I spoke with Caryl Pina at [facility].  Patient with new dark tarry stools and Hgb 8.6.  Per Caryl Pina patient is asymptomatic.  Caryl Pina is given an appt for the patient for 11/02/18 2:00.  She will fax labs to attention on [place] [facility]

## 2018-11-02 ENCOUNTER — Ambulatory Visit: Payer: BC Managed Care – PPO | Admitting: Physician Assistant

## 2018-11-02 ENCOUNTER — Encounter: Payer: Self-pay | Admitting: Physician Assistant

## 2018-11-02 ENCOUNTER — Other Ambulatory Visit (INDEPENDENT_AMBULATORY_CARE_PROVIDER_SITE_OTHER): Payer: BC Managed Care – PPO

## 2018-11-02 VITALS — BP 124/68 | HR 80 | Temp 98.3°F | Ht 66.0 in | Wt 199.1 lb

## 2018-11-02 DIAGNOSIS — Z1211 Encounter for screening for malignant neoplasm of colon: Secondary | ICD-10-CM

## 2018-11-02 DIAGNOSIS — R195 Other fecal abnormalities: Secondary | ICD-10-CM | POA: Diagnosis not present

## 2018-11-02 DIAGNOSIS — D649 Anemia, unspecified: Secondary | ICD-10-CM

## 2018-11-02 DIAGNOSIS — Z86010 Personal history of colonic polyps: Secondary | ICD-10-CM

## 2018-11-02 DIAGNOSIS — D62 Acute posthemorrhagic anemia: Secondary | ICD-10-CM | POA: Diagnosis not present

## 2018-11-02 LAB — IBC + FERRITIN
Ferritin: 12.6 ng/mL (ref 10.0–291.0)
Iron: 45 ug/dL (ref 42–145)
Saturation Ratios: 9.7 % — ABNORMAL LOW (ref 20.0–50.0)
Transferrin: 332 mg/dL (ref 212.0–360.0)

## 2018-11-02 LAB — CBC WITH DIFFERENTIAL/PLATELET
Basophils Absolute: 0.1 10*3/uL (ref 0.0–0.1)
Basophils Relative: 1.3 % (ref 0.0–3.0)
Eosinophils Absolute: 0.3 10*3/uL (ref 0.0–0.7)
Eosinophils Relative: 4.5 % (ref 0.0–5.0)
HCT: 29.2 % — ABNORMAL LOW (ref 36.0–46.0)
Hemoglobin: 9.9 g/dL — ABNORMAL LOW (ref 12.0–15.0)
Lymphocytes Relative: 36.3 % (ref 12.0–46.0)
Lymphs Abs: 2.5 10*3/uL (ref 0.7–4.0)
MCHC: 33.9 g/dL (ref 30.0–36.0)
MCV: 88.4 fl (ref 78.0–100.0)
Monocytes Absolute: 0.7 10*3/uL (ref 0.1–1.0)
Monocytes Relative: 10 % (ref 3.0–12.0)
Neutro Abs: 3.2 10*3/uL (ref 1.4–7.7)
Neutrophils Relative %: 47.9 % (ref 43.0–77.0)
Platelets: 226 10*3/uL (ref 150.0–400.0)
RBC: 3.3 Mil/uL — ABNORMAL LOW (ref 3.87–5.11)
RDW: 13.5 % (ref 11.5–15.5)
WBC: 6.8 10*3/uL (ref 4.0–10.5)

## 2018-11-02 MED ORDER — OMEPRAZOLE 40 MG PO CPDR: 40.0000 mg | DELAYED_RELEASE_CAPSULE | ORAL | 2 refills | Status: DC

## 2018-11-02 NOTE — Progress Notes (Signed)
Subjective:    Patient ID: Stacey Reyes, female    DOB: 1945/06/23, 73 y.o.   MRN: [id number]  HPI Harman is a pleasant 73 year old white female, established with Dr. Carlean Purl, and seen in 2013 when she had colonoscopy for surveillance.  She has history of adenomatous colon polyps, and was found to have 2 small sessile polyps and severe left colon diverticulosis.  Path on the polyps consistent with tubular adenomas and she was indicated for 5-year interval follow-up. She is referred today by Dr. Ortencia Kick PA for evaluation of new anemia.  Patient was seen on 10/19/2018 with complaints of dark tarry appearing stools for about 3 weeks previous.  She was having 1 bowel movement per day, at that time with no associated abdominal pain or cramping, no nausea, indigestion etc.  Labs were done showing hemoglobin 8.7 hematocrit of 27 MCV 95.4.  Reviewing her records hemoglobin was 13.3 in December 2019. Interestingly she says she has not had any further dark stools since around the time of that office visit.  She says her stools are normal and brown now.  She does take occasional Aleve and ibuprofen, maybe a couple of times per week.  No blood thinners.  No prior history of any GI bleeding.  She says she currently feels fine except for some mild fatigue.  Again appetite is been fine no heartburn or indigestion no dysphagia or odynophagia, no abdominal discomfort and no changes in bowel habits. Other medical problems include hypertension , mitral valve disorder, and hyperlipidemia. She mentions that she has been having some mid back pain over the past several months primarily on the left.  Occasionally it sounds as if she gets some radiation of pain into the left leg.  Review of Systems Pertinent positive and negative review of systems were noted in the above HPI section.  All other review of systems was otherwise negative.  Outpatient Encounter Medications as of 11/02/2018  Medication Sig   . lisinopril-hydrochlorothiazide (PRINZIDE,ZESTORETIC) 20-12.5 MG tablet Take 1 tablet by mouth daily.  . Rosuvastatin Calcium 10 MG CPSP Take 10 mg by mouth at bedtime.  . thiamine (VITAMIN B-1) 100 MG tablet Take 100 mg by mouth daily.  . vitamin E 400 UNIT capsule Take 400 Units by mouth. Take 2 daily  . omeprazole (PRILOSEC) 40 MG capsule Take 1 capsule (40 mg total) by mouth every morning. Take 30 minutes before breakfast.  . [DISCONTINUED] aspirin 81 MG tablet Take 81 mg by mouth daily.   No facility-administered encounter medications on file as of 11/02/2018.    Allergies  Allergen Reactions  . Sulfur Rash   Patient Active Problem List   Diagnosis Date Noted  . Acute blood loss anemia 11/02/2018  . Osteopenia after menopause 04/22/2018  . Cardiac murmur due to mitral valve disorder 03/03/2018  . Heart murmur 03/03/2018  . LVH (left ventricular hypertrophy) 11/20/2015  . Arrhythmia 10/11/2015  . Essential hypertension 02/24/2015  . Hyperlipidemia 02/24/2015  . Personal history of colonic polyps-adenomas 01/07/2012   Social History   Socioeconomic History  . Marital status: Married    Spouse name: Not on file  . Number of children: 1  . Years of education: Not on file  . Highest education level: Not on file  Occupational History  . Not on file  Social Needs  . Financial resource strain: Not on file  . Food insecurity    Worry: Not on file    Inability: Not on file  .  Transportation needs    Medical: Not on file    Non-medical: Not on file  Tobacco Use  . Smoking status: Former Smoker    Quit date: 12/23/1984    Years since quitting: 33.8  . Smokeless tobacco: Never Used  Substance and Sexual Activity  . Alcohol use: No  . Drug use: No  . Sexual activity: Not on file  Lifestyle  . Physical activity    Days per week: Not on file    Minutes per session: Not on file  . Stress: Not on file  Relationships  . Social Herbalist on phone: Not on file     Gets together: Not on file    Attends religious service: Not on file    Active member of club or organization: Not on file    Attends meetings of clubs or organizations: Not on file    Relationship status: Not on file  . Intimate partner violence    Fear of current or ex partner: Not on file    Emotionally abused: Not on file    Physically abused: Not on file    Forced sexual activity: Not on file  Other Topics Concern  . Not on file  Social History Narrative  . Not on file    Ms. Marzan family history includes Breast cancer in her sister; Colon cancer (age of onset: 30) in her mother.      Objective:    Vitals:   11/02/18 1405  BP: 124/68  Pulse: 80  Temp: 98.3 F (36.8 C)    Physical Exam Well-developed well-nourished older white female in no acute distress.  Height, Weight, 199 BMI 32.1  HEENT; nontraumatic normocephalic, EOMI, PE R LA, sclera anicteric. Oropharynx; benign Neck; supple, no JVD Cardiovascular; regular rate and rhythm with S1-S2, no murmur rub or gallop Pulmonary; Clear bilaterally Abdomen; soft, nontender, nondistended, no palpable mass or hepatosplenomegaly, bowel sounds are active Rectal; not done today Skin; benign exam, no jaundice rash or appreciable lesions Extremities; no clubbing cyanosis or edema skin warm and dry Neuro/Psych; alert and oriented x4, grossly nonfocal mood and affect appropriate       Assessment & Plan:  #78 73 year old white female with normocytic anemia, and history of dark stools x3 weeks in July, none over the past 10 days. Suspect self-limited GI bleed, etiology not clear rule out possible NSAID induced peptic ulcer disease or gastropathy, rule out esophagitis, rule out angioma ectasia or occult neoplasm  #2 history of adenomatous colon polyps, overdue for follow-up colonoscopy #3 history of diverticulosis #4.  Hypertension #5.  Mitral valve disorder #6.  Left mid back pain  Plan; repeat CBC today, check iron  studies Patient advised to avoid aspirin and NSAIDs, she may use Tylenol for back pain Patient will be scheduled for upper endoscopy and colonoscopy with Dr. Carlean Purl.  Both procedures were discussed in detail with the patient including indications risks and benefits and she is agreeable to proceed. Start omeprazole 40 mg p.o. every morning, if no findings at EGD this can be discontinued.   Amy Genia Harold PA-C 11/02/2018   Cc: Chipper Herb, MD

## 2018-11-02 NOTE — Patient Instructions (Addendum)
If you are age 74 or older, your body mass index should be between 23-30. Your Body mass index is 32.14 kg/m. If this is out of the aforementioned range listed, please consider follow up with your Primary Care Provider.  If you are age 77 or younger, your body mass index should be between 19-25. Your Body mass index is 32.14 kg/m. If this is out of the aformentioned range listed, please consider follow up with your Primary Care Provider.   You have been scheduled for an endoscopy and colonoscopy. Please follow the written instructions given to you at your visit today. Please pick up your prep supplies at the pharmacy within the next 1-3 days. If you use inhalers (even only as needed), please bring them with you on the day of your procedure. Your physician has requested that you go to www.startemmi.com and enter the access code given to you at your visit today. This web site gives a general overview about your procedure. However, you should still follow specific instructions given to you by our office regarding your preparation for the procedure.  Your provider has requested that you go to the basement level for lab work before leaving today. Press "B" on the elevator. The lab is located at the first door on the left as you exit the elevator.  We have sent the following medications to your pharmacy for you to pick up at your convenience: Omeprazole  No aleve, Ibuprofen. Tylenol is fine.  Thank you for choosing me and [facility].   Amy Esterwood, PA-C

## 2018-11-04 ENCOUNTER — Other Ambulatory Visit: Payer: Self-pay

## 2018-11-04 DIAGNOSIS — D649 Anemia, unspecified: Secondary | ICD-10-CM

## 2018-11-12 ENCOUNTER — Encounter: Payer: Self-pay | Admitting: Internal Medicine

## 2018-11-13 ENCOUNTER — Other Ambulatory Visit (INDEPENDENT_AMBULATORY_CARE_PROVIDER_SITE_OTHER): Payer: BC Managed Care – PPO

## 2018-11-13 DIAGNOSIS — D649 Anemia, unspecified: Secondary | ICD-10-CM

## 2018-11-13 LAB — CBC WITH DIFFERENTIAL/PLATELET
Basophils Absolute: 0.1 10*3/uL (ref 0.0–0.1)
Basophils Relative: 1.5 % (ref 0.0–3.0)
Eosinophils Absolute: 0.4 10*3/uL (ref 0.0–0.7)
Eosinophils Relative: 4.9 % (ref 0.0–5.0)
HCT: 33.3 % — ABNORMAL LOW (ref 36.0–46.0)
Hemoglobin: 11.1 g/dL — ABNORMAL LOW (ref 12.0–15.0)
Lymphocytes Relative: 39.3 % (ref 12.0–46.0)
Lymphs Abs: 2.8 10*3/uL (ref 0.7–4.0)
MCHC: 33.4 g/dL (ref 30.0–36.0)
MCV: 87.3 fl (ref 78.0–100.0)
Monocytes Absolute: 0.7 10*3/uL (ref 0.1–1.0)
Monocytes Relative: 9.8 % (ref 3.0–12.0)
Neutro Abs: 3.2 10*3/uL (ref 1.4–7.7)
Neutrophils Relative %: 44.5 % (ref 43.0–77.0)
Platelets: 235 10*3/uL (ref 150.0–400.0)
RBC: 3.82 Mil/uL — ABNORMAL LOW (ref 3.87–5.11)
RDW: 14 % (ref 11.5–15.5)
WBC: 7.2 10*3/uL (ref 4.0–10.5)

## 2018-11-16 ENCOUNTER — Telehealth: Payer: Self-pay

## 2018-11-16 NOTE — Telephone Encounter (Signed)
Aware the results have not been reviewed by her provider.  Aware the results of the hgb are 11.1

## 2018-11-16 NOTE — Telephone Encounter (Signed)
I was screening for covid. Patient asked if someone could call her back with her hemoglobin results.

## 2018-11-16 NOTE — Telephone Encounter (Signed)
Covid-19 screening questions   Do you now or have you had a fever in the last 14 days? NO   Do you have any respiratory symptoms of shortness of breath or cough now or in the last 14 days? NO  Do you have any family members or close contacts with diagnosed or suspected Covid-19 in the past 14 days? NO  Have you been tested for Covid-19 and found to be positive? NO        

## 2018-11-17 ENCOUNTER — Encounter: Payer: Self-pay | Admitting: Internal Medicine

## 2018-11-17 ENCOUNTER — Ambulatory Visit (AMBULATORY_SURGERY_CENTER): Payer: BC Managed Care – PPO | Admitting: Internal Medicine

## 2018-11-17 ENCOUNTER — Other Ambulatory Visit: Payer: Self-pay

## 2018-11-17 VITALS — BP 123/66 | HR 75 | Temp 98.4°F | Resp 17 | Ht 66.0 in | Wt 199.0 lb

## 2018-11-17 DIAGNOSIS — K921 Melena: Secondary | ICD-10-CM | POA: Diagnosis not present

## 2018-11-17 DIAGNOSIS — K5730 Diverticulosis of large intestine without perforation or abscess without bleeding: Secondary | ICD-10-CM

## 2018-11-17 DIAGNOSIS — K2980 Duodenitis without bleeding: Secondary | ICD-10-CM

## 2018-11-17 DIAGNOSIS — Z86010 Personal history of colonic polyps: Secondary | ICD-10-CM

## 2018-11-17 DIAGNOSIS — D123 Benign neoplasm of transverse colon: Secondary | ICD-10-CM

## 2018-11-17 DIAGNOSIS — D509 Iron deficiency anemia, unspecified: Secondary | ICD-10-CM

## 2018-11-17 DIAGNOSIS — D649 Anemia, unspecified: Secondary | ICD-10-CM | POA: Diagnosis not present

## 2018-11-17 MED ORDER — SODIUM CHLORIDE 0.9 % IV SOLN: 500.0000 mL | Freq: Once | INTRAVENOUS | Status: DC

## 2018-11-17 NOTE — Progress Notes (Signed)
Temp-michele boyd  Vital signs-courtney washington

## 2018-11-17 NOTE — Patient Instructions (Addendum)
There was some inflammation in the duodenum and took biopsies. Removed 2 colon polyps - tiny and innocent-appearing.  Let me see the biopsy results and I will call.  I appreciate the opportunity to care for you. Gatha Mayer, MD, [facility]  Please read handouts provided. Continue present medications. Await pathology results.     YOU HAD AN ENDOSCOPIC PROCEDURE TODAY AT [facility]:   Refer to the procedure report that was given to you for any specific questions about what was found during the examination.  If the procedure report does not answer your questions, please call your gastroenterologist to clarify.  If you requested that your care partner not be given the details of your procedure findings, then the procedure report has been included in a sealed envelope for you to review at your convenience later.  YOU SHOULD EXPECT: Some feelings of bloating in the abdomen. Passage of more gas than usual.  Walking can help get rid of the air that was put into your GI tract during the procedure and reduce the bloating. If you had a lower endoscopy (such as a colonoscopy or flexible sigmoidoscopy) you may notice spotting of blood in your stool or on the toilet paper. If you underwent a bowel prep for your procedure, you may not have a normal bowel movement for a few days.  Please Note:  You might notice some irritation and congestion in your nose or some drainage.  This is from the oxygen used during your procedure.  There is no need for concern and it should clear up in a day or so.  SYMPTOMS TO REPORT IMMEDIATELY:   Following lower endoscopy (colonoscopy or flexible sigmoidoscopy):  Excessive amounts of blood in the stool  Significant tenderness or worsening of abdominal pains  Swelling of the abdomen that is new, acute  Fever of 100F or higher   Following upper endoscopy (EGD)  Vomiting of blood or coffee ground material  New chest pain or pain under the shoulder  blades  Painful or persistently difficult swallowing  New shortness of breath  Fever of 100F or higher  Black, tarry-looking stools  For urgent or emergent issues, a gastroenterologist can be reached at any hour by calling [phone].   DIET:  We do recommend a small meal at first, but then you may proceed to your regular diet.  Drink plenty of fluids but you should avoid alcoholic beverages for 24 hours.  ACTIVITY:  You should plan to take it easy for the rest of today and you should NOT DRIVE or use heavy machinery until tomorrow (because of the sedation medicines used during the test).    FOLLOW UP: Our staff will call the number listed on your records 48-72 hours following your procedure to check on you and address any questions or concerns that you may have regarding the information given to you following your procedure. If we do not reach you, we will leave a message.  We will attempt to reach you two times.  During this call, we will ask if you have developed any symptoms of COVID 19. If you develop any symptoms (ie: fever, flu-like symptoms, shortness of breath, cough etc.) before then, please call [phone].  If you test positive for Covid 19 in the 2 weeks post procedure, please call and report this information to [place].    If any biopsies were taken you will be contacted by phone or by letter within the next 1-3 weeks.  Please  call us at [phone] if you have not heard about the biopsies in 3 weeks.    SIGNATURES/CONFIDENTIALITY: You and/or your care partner have signed paperwork which will be entered into your electronic medical record.  These signatures attest to the fact that that the information above on your After Visit Summary has been reviewed and is understood.  Full responsibility of the confidentiality of this discharge information lies with you and/or your care-partner.

## 2018-11-17 NOTE — Op Note (Signed)
[place] Patient Name: Stacey Reyes Procedure Date: 11/17/2018 1:58 PM MRN: [id number] Endoscopist: Gatha Mayer , MD Age: 73 Referring MD:  Date of Birth: 15-Sep-1945 Gender: Female Account #: [id number] Procedure:                Colonoscopy Indications:              Melena, Iron deficiency anemia Medicines:                Propofol per Anesthesia, Monitored Anesthesia Care Procedure:                Pre-Anesthesia Assessment:                           - Prior to the procedure, a History and Physical                            was performed, and patient medications and                            allergies were reviewed. The patient's tolerance of                            previous anesthesia was also reviewed. The risks                            and benefits of the procedure and the sedation                            options and risks were discussed with the patient.                            All questions were answered, and informed consent                            was obtained. Prior Anticoagulants: The patient has                            taken no previous anticoagulant or antiplatelet                            agents. ASA Grade Assessment: II - A patient with                            mild systemic disease. After reviewing the risks                            and benefits, the patient was deemed in                            satisfactory condition to undergo the procedure.                           After obtaining informed consent, the colonoscope  was passed under direct vision. Throughout the                            procedure, the patient's blood pressure, pulse, and                            oxygen saturations were monitored continuously. The                            Colonoscope was introduced through the anus and                            advanced to the the terminal ileum, with   identification of the appendiceal orifice and IC                            valve. The colonoscopy was performed without                            difficulty. The patient tolerated the procedure                            well. The quality of the bowel preparation was                            good. The terminal ileum, ileocecal valve,                            appendiceal orifice, and rectum were photographed.                            The bowel preparation used was Miralax via split                            dose instruction. Scope In: 2:19:45 PM Scope Out: 2:41:33 PM Scope Withdrawal Time: 0 hours 16 minutes 41 seconds  Total Procedure Duration: 0 hours 21 minutes 48 seconds  Findings:                 The perianal and digital rectal examinations were                            normal.                           Two sessile polyps were found in the transverse                            colon. The polyps were diminutive in size. These                            polyps were removed with a cold snare. Resection                            and retrieval were complete. Verification of  patient identification for the specimen was done.                            Estimated blood loss was minimal.                           Multiple diverticula were found in the sigmoid                            colon, descending colon and transverse colon.                           The exam was otherwise without abnormality on                            direct and retroflexion views.                           The terminal ileum appeared normal. Complications:            No immediate complications. Estimated Blood Loss:     Estimated blood loss was minimal. Impression:               - Two diminutive polyps in the transverse colon,                            removed with a cold snare. Resected and retrieved.                           - Diverticulosis in the sigmoid colon, in the                             descending colon and in the transverse colon.                           - The examination was otherwise normal on direct                            and retroflexion views.                           - The examined portion of the ileum was normal. Recommendation:           - Patient has a contact number available for                            emergencies. The signs and symptoms of potential                            delayed complications were discussed with the                            patient. Return to normal activities tomorrow.                            Written  discharge instructions were provided to the                            patient.                           - Resume previous diet.                           - Continue present medications.                           - No recommendation at this time regarding repeat                            colonoscopy due to age.                           - Await pathology results. Gatha Mayer, MD 11/17/2018 3:04:43 PM This report has been signed electronically.

## 2018-11-17 NOTE — Op Note (Signed)
[place] Patient Name: Stacey Reyes Procedure Date: 11/17/2018 1:58 PM MRN: [id number] Endoscopist: Gatha Mayer , MD Age: 73 Referring MD:  Date of Birth: 01-16-1946 Gender: Female Account #: [id number] Procedure:                Upper GI endoscopy Indications:              Iron deficiency anemia, Melena Medicines:                Propofol per Anesthesia, Monitored Anesthesia Care Procedure:                Pre-Anesthesia Assessment:                           - Prior to the procedure, a History and Physical                            was performed, and patient medications and                            allergies were reviewed. The patient's tolerance of                            previous anesthesia was also reviewed. The risks                            and benefits of the procedure and the sedation                            options and risks were discussed with the patient.                            All questions were answered, and informed consent                            was obtained. Prior Anticoagulants: The patient has                            taken no previous anticoagulant or antiplatelet                            agents. ASA Grade Assessment: II - A patient with                            mild systemic disease. After reviewing the risks                            and benefits, the patient was deemed in                            satisfactory condition to undergo the procedure.                           After obtaining informed consent, the endoscope was  passed under direct vision. Throughout the                            procedure, the patient's blood pressure, pulse, and                            oxygen saturations were monitored continuously. The                            Endoscope was introduced through the mouth, and                            advanced to the second part of duodenum. The upper    GI endoscopy was accomplished without difficulty.                            The patient tolerated the procedure well. Scope In: Scope Out: Findings:                 A few diffuse erosions were found in the second                            portion of the duodenum. Biopsies were taken with a                            cold forceps for histology. Verification of patient                            identification for the specimen was done. Estimated                            blood loss was minimal.                           The exam was otherwise without abnormality.                           The cardia and gastric fundus were normal on                            retroflexion.                           Biopsies were taken with a cold forceps in the                            gastric body and in the gastric antrum for                            Helicobacter pylori testing using CLOtest.                            Verification of patient identification for the  specimen was done. Estimated blood loss was minimal. Complications:            No immediate complications. Estimated Blood Loss:     Estimated blood loss was minimal. Impression:               - Duodenal erosions. Biopsied.                           - The examination was otherwise normal.                           - Biopsies were taken with a cold forceps for                            Helicobacter pylori testing using CLOtest. Recommendation:           - Patient has a contact number available for                            emergencies. The signs and symptoms of potential                            delayed complications were discussed with the                            patient. Return to normal activities tomorrow.                            Written discharge instructions were provided to the                            patient.                           - Resume previous diet.                           -  Continue present medications.                           - Await pathology results.                           - See the other procedure note for documentation of                            additional recommendations. Gatha Mayer, MD 11/17/2018 2:59:15 PM This report has been signed electronically.

## 2018-11-17 NOTE — Progress Notes (Signed)
Report to PACU, RN, vss, BBS= Clear.  

## 2018-11-19 ENCOUNTER — Telehealth: Payer: Self-pay | Admitting: *Deleted

## 2018-11-19 ENCOUNTER — Telehealth: Payer: Self-pay

## 2018-11-19 LAB — HELICOBACTER PYLORI SCREEN-BIOPSY: UREASE: NEGATIVE

## 2018-11-19 NOTE — Telephone Encounter (Signed)
  Follow up Call-  Call back number 11/17/2018  Post procedure Call Back phone  # [phone]  Permission to leave phone message Yes  Some recent data might be hidden     Patient questions:  Do you have a fever, pain , or abdominal swelling? No. Pain Score  0 *  Have you tolerated food without any problems? Yes.    Have you been able to return to your normal activities? Yes.    Do you have any questions about your discharge instructions: Diet   No. Medications  No. Follow up visit  No.  Do you have questions or concerns about your Care? No.  Actions: * If pain score is 4 or above: No action needed, pain <4.  1. Have you developed a fever since your procedure? no  2.   Have you had an respiratory symptoms (SOB or cough) since your procedure? no  3.   Have you tested positive for COVID 19 since your procedure no  4.   Have you had any family members/close contacts diagnosed with the COVID 19 since your procedure?  no   If yes to any of these questions please route to Joylene John, RN and Alphonsa Gin, Therapist, sports.

## 2018-11-19 NOTE — Telephone Encounter (Signed)
NO ANSWER, MESSAGE LEFT FOR PATIENT. 

## 2018-11-23 ENCOUNTER — Encounter: Payer: Self-pay | Admitting: Internal Medicine

## 2019-02-01 DIAGNOSIS — E785 Hyperlipidemia, unspecified: Secondary | ICD-10-CM | POA: Diagnosis not present

## 2019-02-01 DIAGNOSIS — I10 Essential (primary) hypertension: Secondary | ICD-10-CM | POA: Diagnosis not present

## 2019-02-21 ENCOUNTER — Other Ambulatory Visit: Payer: Self-pay | Admitting: Family Medicine

## 2019-02-22 NOTE — Telephone Encounter (Signed)
[facility]. NTBS in January. Mail order refill sent

## 2019-03-05 DIAGNOSIS — N390 Urinary tract infection, site not specified: Secondary | ICD-10-CM | POA: Diagnosis not present

## 2019-03-05 DIAGNOSIS — R319 Hematuria, unspecified: Secondary | ICD-10-CM | POA: Diagnosis not present

## 2019-03-05 DIAGNOSIS — Z6832 Body mass index (BMI) 32.0-32.9, adult: Secondary | ICD-10-CM | POA: Diagnosis not present

## 2019-03-05 DIAGNOSIS — R52 Pain, unspecified: Secondary | ICD-10-CM | POA: Diagnosis not present

## 2019-03-05 DIAGNOSIS — M549 Dorsalgia, unspecified: Secondary | ICD-10-CM | POA: Diagnosis not present

## 2019-03-05 DIAGNOSIS — I959 Hypotension, unspecified: Secondary | ICD-10-CM | POA: Diagnosis not present

## 2019-03-08 ENCOUNTER — Other Ambulatory Visit: Payer: Self-pay

## 2019-03-09 ENCOUNTER — Other Ambulatory Visit: Payer: Self-pay

## 2019-03-09 ENCOUNTER — Ambulatory Visit: Payer: BC Managed Care – PPO | Attending: Internal Medicine

## 2019-03-09 ENCOUNTER — Ambulatory Visit (INDEPENDENT_AMBULATORY_CARE_PROVIDER_SITE_OTHER): Payer: BC Managed Care – PPO | Admitting: Family Medicine

## 2019-03-09 ENCOUNTER — Ambulatory Visit (INDEPENDENT_AMBULATORY_CARE_PROVIDER_SITE_OTHER): Payer: BC Managed Care – PPO

## 2019-03-09 ENCOUNTER — Encounter: Payer: Self-pay | Admitting: Family Medicine

## 2019-03-09 VITALS — BP 122/73 | HR 97 | Temp 98.7°F | Ht 66.0 in | Wt 193.6 lb

## 2019-03-09 DIAGNOSIS — R0602 Shortness of breath: Secondary | ICD-10-CM | POA: Diagnosis not present

## 2019-03-09 DIAGNOSIS — I10 Essential (primary) hypertension: Secondary | ICD-10-CM

## 2019-03-09 DIAGNOSIS — M791 Myalgia, unspecified site: Secondary | ICD-10-CM | POA: Diagnosis not present

## 2019-03-09 DIAGNOSIS — Z20822 Contact with and (suspected) exposure to covid-19: Secondary | ICD-10-CM

## 2019-03-09 DIAGNOSIS — Z20828 Contact with and (suspected) exposure to other viral communicable diseases: Secondary | ICD-10-CM | POA: Diagnosis not present

## 2019-03-09 MED ORDER — ALBUTEROL SULFATE HFA 108 (90 BASE) MCG/ACT IN AERS: 2.0000 | INHALATION_SPRAY | Freq: Four times a day (QID) | RESPIRATORY_TRACT | 0 refills | Status: DC | PRN

## 2019-03-09 NOTE — Patient Instructions (Signed)
Text "COVID" to [phone] Log on to [email or web address] to easily make an on-line appointment If you have a patient who does not have access to a smart phone or PC, you or the patient can call  [phone] to get assistance.

## 2019-03-09 NOTE — Progress Notes (Signed)
Assessment & Plan:  1-2. Shortness of breath/Muscle pain - Encouraged patient to get tested for COVID-19 again due to symptoms. Information provided on how to schedule COVID-19 test. Discussed symptom management. Albuterol for wheezing/shortness of breath/tightness in chest. Tylenol for aches/pains/fever.  - DG Chest 2 View - albuterol (VENTOLIN HFA) 108 (90 Base) MCG/ACT inhaler; Inhale 2 puffs into the lungs every 6 (six) hours as needed for wheezing or shortness of breath.  Dispense: 18 g; Refill: 0  3. Essential Hypertension - Encouraged patient to keep a log of her BP. She may stay off BP medication for now but should resume all other medications. Advised to let us know if BP raises and is consistently > 150/90.    Follow up plan: Return for annual physical.  Hendricks Limes, MSN, APRN, FNP-C [facility]  Subjective:   Patient ID: Stacey Reyes, female    DOB: 07-29-1945, 73 y.o.   MRN: [id number]  HPI: Stacey Reyes is a 73 y.o. female presenting on 03/09/2019 for Hypotension (x 10 days- Patient states she has not taking any medication since Friday ) and Shortness of Breath (ongoing but worse x 2 weeks)  Patient reports she has had progressive shortness of breath for a very long time but attributed it to her age.  Last week it got significantly worse and she started having muscle pains and feeling fatigued. She was seen at [facility] on 03/05/2019 where she tested negative for COVID-19. She was told at that time she had a UTI and was prescribed antibiotics. She reports being called yesterday and told to stop the antibiotic as she did not really have a UTI. She denies ever having any urinary symptoms. She is requesting a chest x-ray today.   Also while she was there she reports she was told to stop all of her medications due to low BP. She has been keeping a log of her BP since then. Systolic ranges [id number]; diastolic is in the 0000000 - this is without any  medication. She reports her normal BP is 125-135/85-90.    ROS: Negative unless specifically indicated above in HPI.   Relevant past medical history reviewed and updated as indicated.   Allergies and medications reviewed and updated.   Current Outpatient Medications:  .  albuterol (VENTOLIN HFA) 108 (90 Base) MCG/ACT inhaler, Inhale 2 puffs into the lungs every 6 (six) hours as needed for wheezing or shortness of breath., Disp: 18 g, Rfl: 0 .  lisinopril-hydrochlorothiazide (PRINZIDE,ZESTORETIC) 20-12.5 MG tablet, Take 1 tablet by mouth daily. (Patient not taking: Reported on 03/09/2019), Disp: 90 tablet, Rfl: 3 .  Multiple Vitamin (MULTIVITAMIN) capsule, Take 1 capsule by mouth daily., Disp: , Rfl:  .  Rosuvastatin Calcium 10 MG CPSP, Take 10 mg by mouth at bedtime. (Patient not taking: Reported on 03/09/2019), Disp: 90 capsule, Rfl: 3 .  thiamine (VITAMIN B-1) 100 MG tablet, Take 100 mg by mouth daily., Disp: , Rfl:  .  vitamin E 400 UNIT capsule, Take 400 Units by mouth. Take 2 daily, Disp: , Rfl:   Allergies  Allergen Reactions  . Sulfur Rash    Objective:   BP 122/73   Pulse 97   Temp 98.7 F (37.1 C) (Temporal)   Ht 5\' 6"  (1.676 m)   Wt 193 lb 9.6 oz (87.8 kg)   SpO2 100%   BMI 31.25 kg/m    Physical Exam Vitals reviewed.  Constitutional:      General: She is not  in acute distress.    Appearance: Normal appearance. She is obese. She is not ill-appearing, toxic-appearing or diaphoretic.  HENT:     Head: Normocephalic and atraumatic.  Eyes:     General: No scleral icterus.       Right eye: No discharge.        Left eye: No discharge.     Conjunctiva/sclera: Conjunctivae normal.  Cardiovascular:     Rate and Rhythm: Normal rate and regular rhythm.     Heart sounds: Murmur present. Systolic murmur present with a grade of 3/6. No friction rub. No gallop.   Pulmonary:     Effort: Pulmonary effort is normal. No respiratory distress.     Breath sounds: Normal breath  sounds and air entry. No stridor. No decreased breath sounds, wheezing, rhonchi or rales.  Musculoskeletal:        General: Normal range of motion.     Cervical back: Normal range of motion.  Skin:    General: Skin is warm and dry.     Capillary Refill: Capillary refill takes less than 2 seconds.  Neurological:     General: No focal deficit present.     Mental Status: She is alert and oriented to person, place, and time. Mental status is at baseline.  Psychiatric:        Mood and Affect: Mood normal.        Behavior: Behavior normal.        Thought Content: Thought content normal.        Judgment: Judgment normal.

## 2019-03-10 LAB — NOVEL CORONAVIRUS, NAA: SARS-CoV-2, NAA: NOT DETECTED

## 2019-03-11 ENCOUNTER — Telehealth: Payer: Self-pay

## 2019-03-11 NOTE — Telephone Encounter (Signed)
Pt notified of negative COVID-19 results. Understanding verbalized.  Stacey Reyes   

## 2019-03-15 ENCOUNTER — Other Ambulatory Visit: Payer: Self-pay

## 2019-03-15 ENCOUNTER — Ambulatory Visit (HOSPITAL_COMMUNITY)
Admission: RE | Admit: 2019-03-15 | Discharge: 2019-03-15 | Disposition: A | Discharge status: Home / Self Care | Payer: BC Managed Care – PPO | Source: Ambulatory Visit | Attending: Family | Admitting: Family

## 2019-03-15 ENCOUNTER — Ambulatory Visit (INDEPENDENT_AMBULATORY_CARE_PROVIDER_SITE_OTHER): Payer: BC Managed Care – PPO | Admitting: Family

## 2019-03-15 ENCOUNTER — Encounter: Payer: Self-pay | Admitting: Family

## 2019-03-15 ENCOUNTER — Other Ambulatory Visit: Payer: Self-pay | Admitting: Family

## 2019-03-15 DIAGNOSIS — R0602 Shortness of breath: Secondary | ICD-10-CM

## 2019-03-15 LAB — POCT I-STAT CREATININE: Creatinine, Ser: 0.7 mg/dL (ref 0.44–1.00)

## 2019-03-15 MED ORDER — PREDNISONE 10 MG (21) PO TBPK: ORAL_TABLET | ORAL | 0 refills | Status: DC

## 2019-03-15 MED ORDER — DOXYCYCLINE HYCLATE 100 MG PO TABS: 100.0000 mg | ORAL_TABLET | Freq: Two times a day (BID) | ORAL | 0 refills | Status: DC

## 2019-03-15 MED ORDER — IOHEXOL 350 MG/ML SOLN
100.0000 mL | Freq: Once | INTRAVENOUS | Status: AC | PRN
  Administered 2019-03-15: 100 mL via INTRAVENOUS

## 2019-03-15 NOTE — Progress Notes (Signed)
Virtual Visit via telephone Note Due to COVID-19 pandemic this visit was conducted virtually. This visit type was conducted due to national recommendations for restrictions regarding the COVID-19 Pandemic (e.g. social distancing, sheltering in place) in an effort to limit this patient's exposure and mitigate transmission in our community. All issues noted in this document were discussed and addressed.  A physical exam was not performed with this format.  I connected with Stacey Reyes on 03/15/19 at 12:15 pm by telephone and verified that I am speaking with the correct person using two identifiers. Stacey Reyes is currently located at home and no one is currently with her during visit. The provider, Evelina Dun, FNP is located in their office at time of visit.  I discussed the limitations, risks, security and privacy concerns of performing an evaluation and management service by telephone and the availability of in person appointments. I also discussed with the patient that there may be a patient responsible charge related to this service. The patient expressed understanding and agreed to proceed.   History and Present Illness:  PT calls the office today with recurrent SOB. She went to the Urgent Care on 03/05/19 and had a negative COVID test. She repeated her COVID test 03/09/19 and it was also negative. She was seen in the office on 03/09/19 for SOB. She had a negative chest x-ray. She reports she continues to have SOB when exertion. Denies any fever.  Shortness of Breath This is a new problem. The current episode started 1 to 4 weeks ago. The problem has been waxing and waning. Associated symptoms include leg pain. Pertinent negatives include no chest pain, claudication, coryza, ear pain, fever, headaches, hemoptysis, leg swelling, neck pain, orthopnea, swollen glands, syncope or vomiting. She has tried ipratropium inhalers for the symptoms. The treatment provided no relief.       Review of Systems  Constitutional: Negative for fever.  HENT: Negative for ear pain.   Respiratory: Positive for shortness of breath. Negative for hemoptysis.   Cardiovascular: Negative for chest pain, orthopnea, claudication, leg swelling and syncope.  Gastrointestinal: Negative for vomiting.  Musculoskeletal: Negative for neck pain.  Neurological: Negative for headaches.     Observations/Objective: No SOB or distress noted  Assessment and Plan: 1. SOB (shortness of breath) on exertion - CT ANGIO CHEST PE W OR WO CONTRAST; Future  2. Shortness of breath - CT ANGIO CHEST PE W OR WO CONTRAST; Future  Given continued SOB with negative COVID and chest x-ray will proceed with CT to rule out PE.  If SOB worsens go to ED    I discussed the assessment and treatment plan with the patient. The patient was provided an opportunity to ask questions and all were answered. The patient agreed with the plan and demonstrated an understanding of the instructions.   The patient was advised to call back or seek an in-person evaluation if the symptoms worsen or if the condition fails to improve as anticipated.  The above assessment and management plan was discussed with the patient. The patient verbalized understanding of and has agreed to the management plan. Patient is aware to call the clinic if symptoms persist or worsen. Patient is aware when to return to the clinic for a follow-up visit. Patient educated on when it is appropriate to go to the emergency department.   Time call ended:  12:41 pm   I provided 26 minutes of non-face-to-face time during this encounter.    Evelina Dun, FNP

## 2019-03-15 NOTE — Addendum Note (Signed)
Addended by: Evelina Dun A on: 03/15/2019 01:25 PM   Modules accepted: Orders

## 2019-03-17 NOTE — Telephone Encounter (Signed)
error 

## 2019-03-19 ENCOUNTER — Other Ambulatory Visit: Payer: Self-pay | Admitting: Family Medicine

## 2019-03-22 ENCOUNTER — Telehealth: Payer: Self-pay

## 2019-03-22 ENCOUNTER — Telehealth: Payer: Self-pay | Admitting: Family Medicine

## 2019-03-22 NOTE — Telephone Encounter (Signed)
Spoke with pt and advised her of your recommendation and offered televisit appt with you but pt declined and states she isn't going to the ER again and hung up on me.

## 2019-03-22 NOTE — Telephone Encounter (Signed)
She has already had tests for her breathing problem .  When she sits there is no breathing problems but just walking to the kitchen makes her feel short of breath. What else can she do?  Her other issue is her legs will not be still when she is trying to sleep.  She believes she haas restless leg and thinks a medicine might help.  ( Advised to go to hospital for the breathing but she refused). Please advise .

## 2019-03-22 NOTE — Telephone Encounter (Signed)
Spoke to [place] and she stated that she is wanting to Ext. Care with Dr.Burchette. Tamia informed that Dr.Burchette is not accepting any new pt. Kareli stated that she was informed by a friend that Dr.Burchette is the best at treating SOB. Pt stated she has been having SOB lately and is unable to lay down to sleep and do certain things. Pt was advised to go to the local UC or ED. Pt stated she has been to both and other doctors w/po relief. Pt wanting to est. Care with a provider today and wanting to see them for SOB today. Pt advised that it is a process to est.care and may need to come into the office to do that. Due to pt having SOB she will not be able to come into the office. Pt advised that I will need to speak to my clinical lead for advise.

## 2019-03-22 NOTE — Telephone Encounter (Signed)
This behavior is inappropriate. Will route to Raynelle Highland for further action.

## 2019-03-22 NOTE — Telephone Encounter (Signed)
Agree with evaluation in ED if breathing is worse.  Recommend e-visit for discussion of restless legs.

## 2019-03-23 ENCOUNTER — Other Ambulatory Visit: Payer: Self-pay

## 2019-03-23 ENCOUNTER — Ambulatory Visit (INDEPENDENT_AMBULATORY_CARE_PROVIDER_SITE_OTHER): Payer: BC Managed Care – PPO | Admitting: Adult Health

## 2019-03-23 ENCOUNTER — Encounter: Payer: Self-pay | Admitting: Adult Health

## 2019-03-23 ENCOUNTER — Telehealth: Payer: Self-pay | Admitting: Cardiology

## 2019-03-23 VITALS — BP 144/60 | HR 90 | Temp 98.1°F | Ht 66.0 in | Wt 191.4 lb

## 2019-03-23 DIAGNOSIS — I421 Obstructive hypertrophic cardiomyopathy: Secondary | ICD-10-CM | POA: Diagnosis not present

## 2019-03-23 DIAGNOSIS — I10 Essential (primary) hypertension: Secondary | ICD-10-CM

## 2019-03-23 DIAGNOSIS — Z79899 Other long term (current) drug therapy: Secondary | ICD-10-CM | POA: Diagnosis not present

## 2019-03-23 DIAGNOSIS — R011 Cardiac murmur, unspecified: Secondary | ICD-10-CM | POA: Diagnosis not present

## 2019-03-23 DIAGNOSIS — Z1321 Encounter for screening for nutritional disorder: Secondary | ICD-10-CM

## 2019-03-23 DIAGNOSIS — R0602 Shortness of breath: Secondary | ICD-10-CM

## 2019-03-23 MED ORDER — LISINOPRIL 5 MG PO TABS: 5.0000 mg | ORAL_TABLET | Freq: Every day | ORAL | 3 refills | Status: DC

## 2019-03-23 MED ORDER — MAGNESIUM 200 MG PO TABS: 1.0000 | ORAL_TABLET | Freq: Every day | ORAL | 6 refills | Status: DC

## 2019-03-23 MED ORDER — BISOPROLOL FUMARATE 5 MG PO TABS: 2.5000 mg | ORAL_TABLET | Freq: Every day | ORAL | 3 refills | Status: DC

## 2019-03-23 NOTE — Telephone Encounter (Signed)
Called pt again no answer. Left another message for pt. Closing chart.

## 2019-03-23 NOTE — Telephone Encounter (Signed)
New Message  Pt c/o Shortness Of Breath: STAT if SOB developed within the last 24 hours or pt is noticeably SOB on the phone  1. Are you currently SOB (can you hear that pt is SOB on the phone)? Yes  2. How long have you been experiencing SOB? November 21st and its progressively gotten worse  3. Are you SOB when sitting or when up moving around? When up moving around  4. Are you currently experiencing any other symptoms? Restless leg, legs get numb when walking a lot; lightheadness; feel like passing out; fluctuating blood pressure; since December 11th 99/55; 119 pulse; urgent care doctor took her off blood pressure and last blood pressure reading was 151/80.

## 2019-03-23 NOTE — Telephone Encounter (Signed)
Called pt to advised that per clinical supervisor pt will need to have her SOB addressed before est. Care. Pt did not answer. I left a detailed message on pt answering machine with update.

## 2019-03-23 NOTE — Patient Instructions (Signed)
Medication Instructions:  START- Bisoprolol 2.5 mg by mouth daily START- Lisinopril 5 mg by mouth daily START- Magnesium 200 mg by mouth daily  *If you need a refill on your cardiac medications before your next appointment, please call your pharmacy*  Lab Work: BMP, CBC, TSH, Magnesium and Vitamin D  If you have labs (blood work) drawn today and your tests are completely normal, you will receive your results only by: Marland Kitchen MyChart Message (if you have MyChart) OR . A paper copy in the mail If you have any lab test that is abnormal or we need to change your treatment, we will call you to review the results.  Testing/Procedures: Your physician has requested that you have an echocardiogram. Echocardiography is a painless test that uses sound waves to create images of your heart. It provides your doctor with information about the size and shape of your heart and how well your heart's chambers and valves are working. This procedure takes approximately one hour. There are no restrictions for this procedure.  Follow-Up: At [facility], you and your health needs are our priority.  As part of our continuing mission to provide you with exceptional heart care, we have created designated Provider Care Teams.  These Care Teams include your primary Cardiologist (physician) and Advanced Practice Providers (APPs -  Physician Assistants and Nurse Practitioners) who all work together to provide you with the care you need, when you need it.  Your next appointment:   1 month(s)  The format for your next appointment:   In Person  Provider:   Minus Breeding, MD or Jory Sims, DNP

## 2019-03-23 NOTE — Progress Notes (Signed)
Cardiology Office Note   Date:  03/23/2019   ID:  Reyes, Stacey 1945-06-28, MRN [id number]  PCP:  [name], DO  Cardiologist:  Percival Spanish   CC: Dyspnea    History of Present Illness: Stacey Reyes is a 73 y.o. female who presents for ongoing assessment and management of heart murmur. She had echocardiogram 10/20/2017 reveling  Narrow LVOT and chorial SAM, and mildly dilated ascending aorta.  Elevated LVOT gradient of 2.6 mm/s.  She also was found to have normal LV systolic function of 65 to 70% with grade 1 diastolic dysfunction.  The patient has had progressive complaints of dyspnea on exertion.  She has been seen by several physicians to include urgent care for the symptoms.  She has had a CT scan of her chest and has been ruled out for PE on 03/15/2019.  She has been treated for UTI.  She has been on Atrovent inhalers.  She states her symptoms have been worse for him and she is unable to walk across her home without becoming more short of breath.  At urgent care they have discontinued her lisinopril/HCTZ.  She comes today very anxious about her current breathing status.  She is concerned that her symptoms are progressive.  She also complains about restless legs, causing her not to be able to rest at night.  Past Medical History:  Diagnosis Date  . Cataract    removed bilateral  . Chronic cystitis   . Heart murmur   . Hyperlipidemia   . Hypertension   . Personal history of colonic polyps-adenomas 01/07/2012   2009 - 2 diminutive adenomas (prior polyps also) 01/07/2012 - 2 diminutive adenomas      Past Surgical History:  Procedure Laterality Date  . BREAST BIOPSY Right    No Scar seen   . BREAST SURGERY     breast biopsy/ right /benign  . COLONOSCOPY  multiple     Current Outpatient Medications  Medication Sig Dispense Refill  . albuterol (VENTOLIN HFA) 108 (90 Base) MCG/ACT inhaler Inhale 2 puffs into the lungs every 6 (six) hours as needed for wheezing  or shortness of breath. 18 g 0  . doxycycline (VIBRA-TABS) 100 MG tablet Take 1 tablet (100 mg total) by mouth 2 (two) times daily. 20 tablet 0  . Multiple Vitamin (MULTIVITAMIN) capsule Take 1 capsule by mouth daily.    . predniSONE (STERAPRED UNI-[name] 21 TAB) 10 MG (21) TBPK tablet Use as directed 21 tablet 0  . Rosuvastatin Calcium 10 MG CPSP Take 10 mg by mouth at bedtime. 90 capsule 3  . thiamine (VITAMIN B-1) 100 MG tablet Take 100 mg by mouth daily.    . vitamin E 400 UNIT capsule Take 400 Units by mouth. Take 2 daily    . bisoprolol (ZEBETA) 5 MG tablet Take 0.5 tablets (2.5 mg total) by mouth daily. 45 tablet 3  . lisinopril (ZESTRIL) 5 MG tablet Take 1 tablet (5 mg total) by mouth daily. 90 tablet 3  . Magnesium 200 MG TABS Take 1 tablet (200 mg total) by mouth daily. 30 tablet 6   No current facility-administered medications for this visit.    Allergies:   Sulfur    Social History:  The patient  reports that she quit smoking about 34 years ago. She has never used smokeless tobacco. She reports that she does not drink alcohol or use drugs.   Family History:  The patient's family history includes Breast cancer in her sister; Colon  cancer (age of onset: 47) in her mother.    ROS: All other systems are reviewed and negative. Unless otherwise mentioned in H&P    PHYSICAL EXAM: VS:  BP (!) 144/60   Pulse 90   Temp 98.1 F (36.7 C)   Ht 5\' 6"  (1.676 m)   Wt 191 lb 6.4 oz (86.8 kg)   SpO2 99%   BMI 30.89 kg/m  , BMI Body mass index is 30.89 kg/m. GEN: Well nourished, well developed, in no acute distress HEENT: normal Neck: no JVD, carotid bruits, or masses Cardiac: RRR; harsh holosystolic murmur, heard best at the left sternal border, radiating into the apex and axillary location no with prominent S2,, rubs, or gallops,no edema  Respiratory:  Clear to auscultation bilaterally, normal work of breathing.  She is having some shortness of breath when speaking to me and  explaining her symptoms.  No wheezing or rhonchi are noted. GI: soft, nontender, nondistended, + BS MS: no deformity or atrophy Skin: warm and dry, no rash Neuro:  Strength and sensation are intact Psych: euthymic mood, full affect   EKG: Normal sinus rhythm 90 bpm with left atrial enlargement and left ventricular hypertrophy.   Recent Labs: 11/13/2018: Hemoglobin 11.1; Platelets 235.0 03/15/2019: Creatinine, Ser 0.70    Lipid Panel    Component Value Date/Time   CHOL 185 12/05/2011 0000   TRIG 187 (A) 12/05/2011 0000   HDL 44 12/05/2011 0000   LDLCALC 110 12/05/2011 0000      Wt Readings from Last 3 Encounters:  03/23/19 191 lb 6.4 oz (86.8 kg)  03/09/19 193 lb 9.6 oz (87.8 kg)  11/17/18 199 lb (90.3 kg)      Other studies Reviewed: Echocardiogram 07/02/2017 Hyperdynamic LV systolic function; moderate LVH; mild diastolic   dysfunction; narrow LVOT and chordal SAM; midly dilated ascending   aorta; elevated LVOT gradient of 2.6 m/s likely related to   vigorous LV function and possible chordal SAM as aortic valve   appears to open well; mild LAE.  ASSESSMENT AND PLAN:  1.  Dyspnea: Multifactorial in the setting of narrow LVOT, hyperdynamic LV function per echo, obesity, and medication discontinuation on recent visit to urgent care.  I am going to start her on bisoprolol 2.5 mg daily to assist with heart rate control and better cardiac output, and restarted her on lisinopril 5 mg daily for afterload reduction in the setting of narrowed LVOT.  I will repeat her echocardiogram for changes in status of the LVOT and SAM.  She is asked to rest as much as she can and allow the medication to get in her system.  If she feels worse, more dyspneic, having chest pain, or dizziness, she is to report to ED for further evaluation by cardiology.  2.  Hypertension: She had been taken off the lisinopril HCTZ by urgent care due to hypotension.  She is advised to only restart lisinopril 5 mg daily  with addition of bisoprolol.  I will check BMET, TSH, and magnesium to evaluate for electrolyte abnormalities.  3.  Restless leg syndrome: I will add low-dose magnesium 200 mg twice daily.  The patient has loose stools she is to reduce it to 200 mg daily.  Concerns for OSA in the setting of obesity and worsening dyspnea.  This can be addressed on follow-up.  4.  History of asthma: She continues on albuterol as needed.  Due to history of this I will place her on bisoprolol as this is better tolerated in  patients with asthma, to prevent vasospasms.   Current medicines are reviewed at length with the patient today.    Labs/ tests ordered today include: Magnesium, TSH, BMET, Vitamin D, and CBC  Phill Myron. West Pugh, ANP, AACC   03/23/2019 4:56 PM    [facility] [place] [address] Office [phone] Fax [phone]  Notice: This dictation was prepared with Dragon dictation along with smaller phrase technology. Any transcriptional errors that result from this process are unintentional and may not be corrected upon review.

## 2019-03-23 NOTE — Telephone Encounter (Signed)
Incoming call from Mining engineer. Call transferred to triage nurse. Spoke with pt who states she has had SOB since 02/13/2019 and it has progressively gotten worse. SOB more significant when walking around her home. She states Dec 11 she went to urgent care and was advised to stop taking her Zestoretic d/t BP 99/55 and HR 119 and f/u with her PCP. Current BP 171/72 and HR 82. Pt confirmed she was advised by her PCP to report to ED if breathing issues worsen but pt did not want to report to ED. She also states she has issues with restless legs. Advised pt to f/u with PCP regarding restless legs but pt scheduled for appt today at 3:45 pm with Arnold Long, DNP to discuss SOB and medication regimen

## 2019-03-24 ENCOUNTER — Telehealth: Payer: Self-pay | Admitting: Adult Health

## 2019-03-24 ENCOUNTER — Other Ambulatory Visit: Payer: Self-pay

## 2019-03-24 ENCOUNTER — Observation Stay (HOSPITAL_BASED_OUTPATIENT_CLINIC_OR_DEPARTMENT_OTHER): Payer: BC Managed Care – PPO

## 2019-03-24 ENCOUNTER — Encounter (HOSPITAL_COMMUNITY): Payer: Self-pay

## 2019-03-24 ENCOUNTER — Observation Stay (HOSPITAL_COMMUNITY)
Admission: EM | Admit: 2019-03-24 | Discharge: 2019-03-25 | Disposition: A | Discharge status: Home / Self Care | Payer: BC Managed Care – PPO | Attending: Family Medicine | Admitting: Family Medicine

## 2019-03-24 DIAGNOSIS — E782 Mixed hyperlipidemia: Secondary | ICD-10-CM | POA: Diagnosis not present

## 2019-03-24 DIAGNOSIS — I350 Nonrheumatic aortic (valve) stenosis: Secondary | ICD-10-CM

## 2019-03-24 DIAGNOSIS — I340 Nonrheumatic mitral (valve) insufficiency: Secondary | ICD-10-CM | POA: Diagnosis not present

## 2019-03-24 DIAGNOSIS — Z87891 Personal history of nicotine dependence: Secondary | ICD-10-CM | POA: Diagnosis not present

## 2019-03-24 DIAGNOSIS — E538 Deficiency of other specified B group vitamins: Secondary | ICD-10-CM | POA: Diagnosis present

## 2019-03-24 DIAGNOSIS — Z79899 Other long term (current) drug therapy: Secondary | ICD-10-CM | POA: Diagnosis not present

## 2019-03-24 DIAGNOSIS — Z20828 Contact with and (suspected) exposure to other viral communicable diseases: Secondary | ICD-10-CM | POA: Diagnosis not present

## 2019-03-24 DIAGNOSIS — E785 Hyperlipidemia, unspecified: Secondary | ICD-10-CM | POA: Insufficient documentation

## 2019-03-24 DIAGNOSIS — D500 Iron deficiency anemia secondary to blood loss (chronic): Secondary | ICD-10-CM | POA: Diagnosis not present

## 2019-03-24 DIAGNOSIS — D62 Acute posthemorrhagic anemia: Secondary | ICD-10-CM

## 2019-03-24 DIAGNOSIS — D649 Anemia, unspecified: Secondary | ICD-10-CM | POA: Diagnosis not present

## 2019-03-24 DIAGNOSIS — I10 Essential (primary) hypertension: Secondary | ICD-10-CM | POA: Diagnosis present

## 2019-03-24 DIAGNOSIS — R0602 Shortness of breath: Secondary | ICD-10-CM | POA: Diagnosis not present

## 2019-03-24 LAB — VITAMIN B12: Vitamin B-12: 145 pg/mL — ABNORMAL LOW (ref 180–914)

## 2019-03-24 LAB — TROPONIN I (HIGH SENSITIVITY)
Troponin I (High Sensitivity): 10 ng/L (ref <18)
Troponin I (High Sensitivity): 9 ng/L (ref <18)

## 2019-03-24 LAB — CBC WITH DIFFERENTIAL/PLATELET
Abs Immature Granulocytes: 0.02 10*3/uL (ref 0.00–0.07)
Basophils Absolute: 0.1 10*3/uL (ref 0.0–0.1)
Basophils Relative: 1 %
Eosinophils Absolute: 0.5 10*3/uL (ref 0.0–0.5)
Eosinophils Relative: 6 %
HCT: 18.9 % — ABNORMAL LOW (ref 36.0–46.0)
Hemoglobin: 5.3 g/dL — CL (ref 12.0–15.0)
Immature Granulocytes: 0 %
Lymphocytes Relative: 36 %
Lymphs Abs: 3 10*3/uL (ref 0.7–4.0)
MCH: 21.3 pg — ABNORMAL LOW (ref 26.0–34.0)
MCHC: 28 g/dL — ABNORMAL LOW (ref 30.0–36.0)
MCV: 75.9 fL — ABNORMAL LOW (ref 80.0–100.0)
Monocytes Absolute: 1 10*3/uL (ref 0.1–1.0)
Monocytes Relative: 12 %
Neutro Abs: 3.8 10*3/uL (ref 1.7–7.7)
Neutrophils Relative %: 45 %
Platelets: 264 10*3/uL (ref 150–400)
RBC: 2.49 MIL/uL — ABNORMAL LOW (ref 3.87–5.11)
RDW: 22.1 % — ABNORMAL HIGH (ref 11.5–15.5)
WBC: 8.3 10*3/uL (ref 4.0–10.5)
nRBC: 0 % (ref 0.0–0.2)

## 2019-03-24 LAB — FOLATE: Folate: 9.4 ng/mL (ref >5.9)

## 2019-03-24 LAB — RETICULOCYTES
Immature Retic Fract: 8.3 % (ref 2.3–15.9)
RBC.: 2.47 MIL/uL — ABNORMAL LOW (ref 3.87–5.11)
Retic Count, Absolute: 52.1 10*3/uL (ref 19.0–186.0)
Retic Ct Pct: 2.1 % (ref 0.4–3.1)

## 2019-03-24 LAB — COMPREHENSIVE METABOLIC PANEL
ALT: 14 U/L (ref 0–44)
AST: 15 U/L (ref 15–41)
Albumin: 3.6 g/dL (ref 3.5–5.0)
Alkaline Phosphatase: 30 U/L — ABNORMAL LOW (ref 38–126)
Anion gap: 7 (ref 5–15)
BUN: 14 mg/dL (ref 8–23)
CO2: 22 mmol/L (ref 22–32)
Calcium: 8.4 mg/dL — ABNORMAL LOW (ref 8.9–10.3)
Chloride: 104 mmol/L (ref 98–111)
Creatinine, Ser: 0.65 mg/dL (ref 0.44–1.00)
GFR calc Af Amer: >60 mL/min (ref >60)
GFR calc non Af Amer: >60 mL/min (ref >60)
Glucose, Bld: 113 mg/dL — ABNORMAL HIGH (ref 70–99)
Potassium: 3.6 mmol/L (ref 3.5–5.1)
Sodium: 133 mmol/L — ABNORMAL LOW (ref 135–145)
Total Bilirubin: 0.7 mg/dL (ref 0.3–1.2)
Total Protein: 5.9 g/dL — ABNORMAL LOW (ref 6.5–8.1)

## 2019-03-24 LAB — MAGNESIUM: Magnesium: 2.1 mg/dL (ref 1.6–2.3)

## 2019-03-24 LAB — ECHOCARDIOGRAM COMPLETE
Height: 66 in
Weight: 3056 oz

## 2019-03-24 LAB — CBC
Hematocrit: 19.2 % — ABNORMAL LOW (ref 34.0–46.6)
Hemoglobin: 5.6 g/dL — CL (ref 11.1–15.9)
MCH: 21.5 pg — ABNORMAL LOW (ref 26.6–33.0)
MCHC: 29.2 g/dL — ABNORMAL LOW (ref 31.5–35.7)
MCV: 74 fL — ABNORMAL LOW (ref 79–97)
Platelets: 307 10*3/uL (ref 150–450)
RBC: 2.61 x10E6/uL — CL (ref 3.77–5.28)
RDW: 20.4 % — ABNORMAL HIGH (ref 11.7–15.4)
WBC: 9.9 10*3/uL (ref 3.4–10.8)

## 2019-03-24 LAB — BASIC METABOLIC PANEL
BUN/Creatinine Ratio: 18 (ref 12–28)
BUN: 13 mg/dL (ref 8–27)
CO2: 23 mmol/L (ref 20–29)
Calcium: 9.5 mg/dL (ref 8.7–10.3)
Chloride: 102 mmol/L (ref 96–106)
Creatinine, Ser: 0.74 mg/dL (ref 0.57–1.00)
GFR calc Af Amer: 92 mL/min/{1.73_m2} (ref >59)
GFR calc non Af Amer: 80 mL/min/{1.73_m2} (ref >59)
Glucose: 102 mg/dL — ABNORMAL HIGH (ref 65–99)
Potassium: 4.5 mmol/L (ref 3.5–5.2)
Sodium: 137 mmol/L (ref 134–144)

## 2019-03-24 LAB — ABO/RH: ABO/RH(D): O POS

## 2019-03-24 LAB — PREPARE RBC (CROSSMATCH)

## 2019-03-24 LAB — FERRITIN: Ferritin: 6 ng/mL — ABNORMAL LOW (ref 11–307)

## 2019-03-24 LAB — SARS CORONAVIRUS 2 (TAT 6-24 HRS): SARS Coronavirus 2: NEGATIVE

## 2019-03-24 LAB — IRON AND TIBC
Iron: 10 ug/dL — ABNORMAL LOW (ref 28–170)
Saturation Ratios: 2 % — ABNORMAL LOW (ref 10.4–31.8)
TIBC: 490 ug/dL — ABNORMAL HIGH (ref 250–450)
UIBC: 480 ug/dL

## 2019-03-24 LAB — TSH: TSH: 1.02 u[IU]/mL (ref 0.450–4.500)

## 2019-03-24 LAB — BRAIN NATRIURETIC PEPTIDE: B Natriuretic Peptide: 462 pg/mL — ABNORMAL HIGH (ref 0.0–100.0)

## 2019-03-24 LAB — VITAMIN D 25 HYDROXY (VIT D DEFICIENCY, FRACTURES): Vit D, 25-Hydroxy: 26.7 ng/mL — ABNORMAL LOW (ref 30.0–100.0)

## 2019-03-24 MED ORDER — VITAMIN E 180 MG (400 UNIT) PO CAPS
400.0000 [IU] | ORAL_CAPSULE | Freq: Every day | ORAL | Status: DC
  Administered 2019-03-25: 400 [IU] via ORAL
  Filled 2019-03-24 (×4): qty 1

## 2019-03-24 MED ORDER — ONDANSETRON HCL 4 MG PO TABS: 4.0000 mg | ORAL_TABLET | Freq: Four times a day (QID) | ORAL | Status: DC | PRN

## 2019-03-24 MED ORDER — SODIUM CHLORIDE 0.9 % IV SOLN
510.0000 mg | Freq: Once | INTRAVENOUS | Status: DC
  Filled 2019-03-24: qty 17

## 2019-03-24 MED ORDER — MELATONIN 3 MG PO TABS
6.0000 mg | ORAL_TABLET | Freq: Every evening | ORAL | Status: DC | PRN
  Administered 2019-03-24: 6 mg via ORAL
  Filled 2019-03-24: qty 2

## 2019-03-24 MED ORDER — ADULT MULTIVITAMIN W/MINERALS CH
1.0000 | ORAL_TABLET | Freq: Every day | ORAL | Status: DC
  Administered 2019-03-24 – 2019-03-25 (×2): 1 via ORAL
  Filled 2019-03-24 (×2): qty 1

## 2019-03-24 MED ORDER — SODIUM CHLORIDE 0.9 % IV SOLN
510.0000 mg | Freq: Once | INTRAVENOUS | Status: AC
  Administered 2019-03-24: 510 mg via INTRAVENOUS
  Filled 2019-03-24: qty 17

## 2019-03-24 MED ORDER — THIAMINE HCL 100 MG PO TABS
100.0000 mg | ORAL_TABLET | Freq: Every day | ORAL | Status: DC
  Administered 2019-03-24 – 2019-03-25 (×2): 100 mg via ORAL
  Filled 2019-03-24 (×2): qty 1

## 2019-03-24 MED ORDER — CYANOCOBALAMIN 1000 MCG/ML IJ SOLN
1000.0000 ug | Freq: Every day | INTRAMUSCULAR | Status: DC
  Administered 2019-03-24: 1000 ug via INTRAMUSCULAR
  Filled 2019-03-24 (×2): qty 1

## 2019-03-24 MED ORDER — ONDANSETRON HCL 4 MG/2ML IJ SOLN: 4.0000 mg | Freq: Four times a day (QID) | INTRAMUSCULAR | Status: DC | PRN

## 2019-03-24 MED ORDER — ACETAMINOPHEN 325 MG PO TABS: 650.0000 mg | ORAL_TABLET | Freq: Four times a day (QID) | ORAL | Status: DC | PRN

## 2019-03-24 MED ORDER — SODIUM CHLORIDE 0.9 % IV SOLN: 10.0000 mL/h | Freq: Once | INTRAVENOUS | Status: DC

## 2019-03-24 MED ORDER — ROSUVASTATIN CALCIUM 10 MG PO TABS
10.0000 mg | ORAL_TABLET | Freq: Every day | ORAL | Status: DC
  Administered 2019-03-24: 10 mg via ORAL
  Filled 2019-03-24 (×3): qty 1

## 2019-03-24 MED ORDER — MAGNESIUM OXIDE 400 (241.3 MG) MG PO TABS
400.0000 mg | ORAL_TABLET | Freq: Every day | ORAL | Status: DC
  Administered 2019-03-24 – 2019-03-25 (×2): 400 mg via ORAL
  Filled 2019-03-24 (×2): qty 1

## 2019-03-24 MED ORDER — ACETAMINOPHEN 650 MG RE SUPP: 650.0000 mg | Freq: Four times a day (QID) | RECTAL | Status: DC | PRN

## 2019-03-24 NOTE — H&P (Signed)
History and Physical  Stacey Reyes [id number] DOB: 18-Aug-1945 DOA: 03/24/2019   PCP: [name], DO   Patient coming from: Home  Chief Complaint: sob  HPI:  Stacey Reyes is a 73 y.o. female with medical history of hypertension, hyperlipidemia, and asthma presenting with approximately 1 month history of shortness of breath that has significantly worsened in the past 2 weeks.  The patient denies any fevers, chills, coughing, hemoptysis, nausea, vomiting, diarrhea, abdominal pain, hematochezia, melena.  The patient went to see her cardiologist on 03/23/2019.  Routine blood work was obtained and showed hemoglobin 5.6.  She was referred to the emergency department for further evaluation.  The patient denies anyaspirin, or anticoagulant usage.  However, she states that she has rarely used some ibuprofen approximately 2 weeks ago for some back pain.  However she states that she has not used any ibuprofen in the past week.  She was seen in urgent care on 03/05/2019.  She was noted to have a soft blood pressure with systolic 99.  She was instructed to stop her lisinopril/HCTZ.  When she followed up with cardiology yesterday, the patient was started on bisoprolol 2.5 mg and lisinopril 5 mg daily which she took prior to coming to the emergency department. Notably, the patient had EGD on 11/17/2018 which showed duodenal erosions.  The pathology from the biopsy showed benign small bowel mucosa.  Colonoscopy on 11/17/2018 showed two diminutive polyps in the transverse colon.  Pathology showed tubular adenomas.  There was sigmoid diverticulosis. In the emergency department, the patient was afebrile hemodynamically stable with oxygen saturation 100% on room air.  BMP and LFTs were essentially unremarkable.  Hemoglobin was 5.3.  WBC 8.3, platelets 264,000.  The patient was transfused 2 units PRBC.  FOBT was negative.  Assessment/Plan: Symptomatic anemia -Likely secondary to iron deficiency  and low B12 -Iron saturation 2%, ferritin six -Serum B12 145 -Transfuse 2 units PRBC -Recent EGD and colonoscopy as discussed above -Repeat FOBT -Transfuse Feraheme and supplement B12  Essential hypertension -Holding bisoprolol and lisinopril temporarily and monitor blood pressures  Hyperlipidemia -Continue statin  Chest pain -EKG -Cycle troponins -Echocardiogram -03/15/2019 CTA chest--negative for PE.  Small scattered bilateral pulmonary nodules up to 0.5 cm.  Small bilateral pleural effusions   Active Problems:   Symptomatic anemia       Past Medical History:  Diagnosis Date  . Cataract    removed bilateral  . Chronic cystitis   . Heart murmur   . Hyperlipidemia   . Hypertension   . Personal history of colonic polyps-adenomas 01/07/2012   2009 - 2 diminutive adenomas (prior polyps also) 01/07/2012 - 2 diminutive adenomas     Past Surgical History:  Procedure Laterality Date  . BREAST BIOPSY Right    No Scar seen   . BREAST SURGERY     breast biopsy/ right /benign  . COLONOSCOPY  multiple   Social History:  reports that she quit smoking about 34 years ago. She has never used smokeless tobacco. She reports that she does not drink alcohol or use drugs.   Family History  Problem Relation Age of Onset  . Colon cancer Mother 30       80's  . Breast cancer Sister   . Colon polyps Neg Hx   . Esophageal cancer Neg Hx   . Rectal cancer Neg Hx   . Stomach cancer Neg Hx      Allergies  Allergen Reactions  . Sulfur  Rash     Prior to Admission medications   Medication Sig Start Date End Date Taking? Authorizing Provider  albuterol (VENTOLIN HFA) 108 (90 Base) MCG/ACT inhaler Inhale 2 puffs into the lungs every 6 (six) hours as needed for wheezing or shortness of breath. 03/09/19   Loman Brooklyn, FNP  bisoprolol (ZEBETA) 5 MG tablet Take 0.5 tablets (2.5 mg total) by mouth daily. 03/23/19   Lendon Colonel, NP  doxycycline (VIBRA-TABS) 100 MG tablet  Take 1 tablet (100 mg total) by mouth 2 (two) times daily. 03/15/19   Evelina Dun A, FNP  lisinopril (ZESTRIL) 5 MG tablet Take 1 tablet (5 mg total) by mouth daily. 03/23/19 06/21/19  Lendon Colonel, NP  Magnesium 200 MG TABS Take 1 tablet (200 mg total) by mouth daily. 03/23/19   Lendon Colonel, NP  Multiple Vitamin (MULTIVITAMIN) capsule Take 1 capsule by mouth daily.    [provider]  predniSONE (STERAPRED UNI-[name] 21 TAB) 10 MG (21) TBPK tablet Use as directed 03/15/19   Evelina Dun A, FNP  Rosuvastatin Calcium 10 MG CPSP Take 10 mg by mouth at bedtime. 04/21/18   [name], DO  thiamine (VITAMIN B-1) 100 MG tablet Take 100 mg by mouth daily.    [provider]  vitamin E 400 UNIT capsule Take 400 Units by mouth. Take 2 daily    [provider]    Review of Systems:  Constitutional:  No weight loss, night sweats, Fevers, chills Head&Eyes: No headache.  No vision loss.  No eye pain or scotoma ENT:  No Difficulty swallowing,Tooth/dental problems,Sore throat,  No ear ache, post nasal drip,  Cardio-vascular:  No Orthopnea, PND, swelling in lower extremities,  dizziness, palpitations  GI:  No  abdominal pain, nausea, vomiting, diarrhea, loss of appetite, hematochezia, melena, heartburn, indigestion, Resp:  No shortness of breath with exertion or at rest. No cough. No coughing up of blood .No wheezing.No chest wall deformity  Skin:  no rash or lesions.  GU:  no dysuria, change in color of urine, no urgency or frequency. No flank pain.  Musculoskeletal:  No joint pain or swelling. No decreased range of motion. No back pain.  Psych:  No change in mood or affect. No depression or anxiety. Neurologic: No headache, no dysesthesia, no focal weakness, no vision loss. No syncope  Physical Exam: Vitals:   03/24/19 0439 03/24/19 0500 03/24/19 0727 03/24/19 0727  BP: (!) 126/55 (!) 131/53 96/77 (!) 133/59  Pulse: 64 65 65 66  Resp: 14 11  16 13   Temp:    97.9 F (36.6 C)  TempSrc:    Oral  SpO2: 99% 97%  98%  Weight:      Height:       General:  A&O x 3, NAD, nontoxic, pleasant/cooperative Head/Eye: No conjunctival hemorrhage, no icterus, [place]/AT, No nystagmus ENT:  No icterus,  No thrush, good dentition, no pharyngeal exudate Neck:  No masses, no lymphadenpathy, no bruits CV:  RRR, no rub, no gallop, no S3 Lung:  CTAB, good air movement, no wheeze, no rhonchi Abdomen: soft/NT, +BS, nondistended, no peritoneal signs Ext: No cyanosis, No rashes, No petechiae, No lymphangitis, No edema Neuro: CNII-XII intact, strength 4/5 in bilateral upper and lower extremities, no dysmetria  Labs on Admission:  Basic Metabolic Panel: Recent Labs  Lab 03/23/19 1638 03/24/19 0331  NA 137 133*  K 4.5 3.6  CL 102 104  CO2 23 22  GLUCOSE 102* 113*  BUN  13 14  CREATININE 0.74 0.65  CALCIUM 9.5 8.4*  MG 2.1  --    Liver Function Tests: Recent Labs  Lab 03/24/19 0331  AST 15  ALT 14  ALKPHOS 30*  BILITOT 0.7  PROT 5.9*  ALBUMIN 3.6   No results for input(s): LIPASE, AMYLASE in the last 168 hours. No results for input(s): AMMONIA in the last 168 hours. CBC: Recent Labs  Lab 03/23/19 1638 03/24/19 0331  WBC 9.9 8.3  NEUTROABS  --  3.8  HGB 5.6* 5.3*  HCT 19.2* 18.9*  MCV 74* 75.9*  PLT 307 264   Coagulation Profile: No results for input(s): INR, PROTIME in the last 168 hours. Cardiac Enzymes: No results for input(s): CKTOTAL, CKMB, CKMBINDEX, TROPONINI in the last 168 hours. BNP: Invalid input(s): POCBNP CBG: No results for input(s): GLUCAP in the last 168 hours. Urine analysis:    Component Value Date/Time   APPEARANCEUR Cloudy (A) 12/12/2016 1758   GLUCOSEU Negative 12/12/2016 1758   BILIRUBINUR Negative 12/12/2016 1758   PROTEINUR 2+ (A) 12/12/2016 1758   UROBILINOGEN negative 02/24/2015 1344   NITRITE Negative 12/12/2016 1758   LEUKOCYTESUR 1+ (A) 12/12/2016 1758   Sepsis  Labs: @LABRCNTIP (procalcitonin:4,lacticidven:4) )No results found for this or any previous visit (from the past 240 hour(s)).   Radiological Exams on Admission: No results found.      Time spent:60 minutes Code Status:   FULL Family Communication:  No Family at bedside Disposition Plan: expect 1-2 day hospitalization Consults called: none DVT Prophylaxis: SCDs  Orson Eva, DO  [facility] Pager [phone]  If 7PM-7AM, please contact night-coverage [email or web address] Password [email or web address] 03/24/2019, 7:46 AM

## 2019-03-24 NOTE — Telephone Encounter (Signed)
Notified by [employer] that patient's hemoglobin on CBC drawn as outpatient today resulted as 5.6. Chart reviewed, no anemia to this degree present previously. Called patient and advised her to come into the emergency department for a repeat CBC and work up and treatment of symptomatic anemia if confirmed.   Linard Daft K. Marletta Lor, MD

## 2019-03-24 NOTE — ED Provider Notes (Addendum)
[facility] EMERGENCY DEPARTMENT Provider Note   CSN: [id number] Arrival date & time: 03/24/19  0214   History Chief Complaint  Patient presents with  . abnormal labs    Stacey Reyes is a 73 y.o. female.  The history is provided by the patient.  She has history of hypertension, hyperlipidemia, colonic polyps and comes in because her hemoglobin is low.  She saw her cardiologist because she has been having shortness of breath for approximately a month.  Shortness of breath is exertional.  There has been some occasional chest pain with this.  His symptoms have been getting worse.  She denies melena.  She is not on any anticoagulants or antiplatelet agents or NSAIDs.  Past Medical History:  Diagnosis Date  . Cataract    removed bilateral  . Chronic cystitis   . Heart murmur   . Hyperlipidemia   . Hypertension   . Personal history of colonic polyps-adenomas 01/07/2012   2009 - 2 diminutive adenomas (prior polyps also) 01/07/2012 - 2 diminutive adenomas      Patient Active Problem List   Diagnosis Date Noted  . Acute blood loss anemia 11/02/2018  . Osteopenia after menopause 04/22/2018  . Cardiac murmur due to mitral valve disorder 03/03/2018  . Heart murmur 03/03/2018  . LVH (left ventricular hypertrophy) 11/20/2015  . Arrhythmia 10/11/2015  . Essential hypertension 02/24/2015  . Hyperlipidemia 02/24/2015  . Personal history of colonic polyps-adenomas 01/07/2012    Past Surgical History:  Procedure Laterality Date  . BREAST BIOPSY Right    No Scar seen   . BREAST SURGERY     breast biopsy/ right /benign  . COLONOSCOPY  multiple     OB History   No obstetric history on file.     Family History  Problem Relation Age of Onset  . Colon cancer Mother 46       80's  . Breast cancer Sister   . Colon polyps Neg Hx   . Esophageal cancer Neg Hx   . Rectal cancer Neg Hx   . Stomach cancer Neg Hx     Social History   Tobacco Use  . Smoking status: Former  Smoker    Quit date: 12/23/1984    Years since quitting: 34.2  . Smokeless tobacco: Never Used  Substance Use Topics  . Alcohol use: No  . Drug use: No    Home Medications Prior to Admission medications   Medication Sig Start Date End Date Taking? Authorizing Provider  albuterol (VENTOLIN HFA) 108 (90 Base) MCG/ACT inhaler Inhale 2 puffs into the lungs every 6 (six) hours as needed for wheezing or shortness of breath. 03/09/19   Loman Brooklyn, FNP  bisoprolol (ZEBETA) 5 MG tablet Take 0.5 tablets (2.5 mg total) by mouth daily. 03/23/19   Lendon Colonel, NP  doxycycline (VIBRA-TABS) 100 MG tablet Take 1 tablet (100 mg total) by mouth 2 (two) times daily. 03/15/19   Evelina Dun A, FNP  lisinopril (ZESTRIL) 5 MG tablet Take 1 tablet (5 mg total) by mouth daily. 03/23/19 06/21/19  Lendon Colonel, NP  Magnesium 200 MG TABS Take 1 tablet (200 mg total) by mouth daily. 03/23/19   Lendon Colonel, NP  Multiple Vitamin (MULTIVITAMIN) capsule Take 1 capsule by mouth daily.    [provider]  predniSONE (STERAPRED UNI-[name] 21 TAB) 10 MG (21) TBPK tablet Use as directed 03/15/19   Evelina Dun A, FNP  Rosuvastatin Calcium 10 MG CPSP Take 10 mg by  mouth at bedtime. 04/21/18   [name], DO  thiamine (VITAMIN B-1) 100 MG tablet Take 100 mg by mouth daily.    [provider]  vitamin E 400 UNIT capsule Take 400 Units by mouth. Take 2 daily    [provider]    Allergies    Sulfur  Review of Systems   Review of Systems  All other systems reviewed and are negative.   Physical Exam Updated Vital Signs BP (!) 117/54 (BP Location: Right Arm)   Pulse 68   Temp 98.2 F (36.8 C) (Oral)   Resp 20   Ht 5\' 6"  (1.676 m)   Wt 86.6 kg   SpO2 99%   BMI 30.83 kg/m   Physical Exam Vitals and nursing note reviewed.   73 year old female, resting comfortably and in no acute distress. Vital signs are normal. Oxygen saturation is 99%, which is  normal. Head is normocephalic and atraumatic. PERRLA, EOMI. Oropharynx is clear.  Conjunctivae are pale. Neck is nontender and supple without adenopathy or JVD. Back is nontender and there is no CVA tenderness. Lungs are clear without rales, wheezes, or rhonchi. Chest is nontender. Heart has regular rate and rhythm without murmur. Abdomen is soft, flat, nontender without masses or hepatosplenomegaly and peristalsis is normoactive. Rectal: Normal sphincter tone.  Small amount of light brown stool present which is Hemoccult negative. Extremities have no cyanosis or edema, full range of motion is present. Skin is warm and dry without rash. Neurologic: Mental status is normal, cranial nerves are intact, there are no motor or sensory deficits.  ED Results / Procedures / Treatments   Labs (all labs ordered are listed, but only abnormal results are displayed) Labs Reviewed  CBC WITH DIFFERENTIAL/PLATELET - Abnormal; Notable for the following components:      Result Value   RBC 2.49 (*)    Hemoglobin 5.3 (*)    HCT 18.9 (*)    MCV 75.9 (*)    MCH 21.3 (*)    MCHC 28.0 (*)    RDW 22.1 (*)    All other components within normal limits  COMPREHENSIVE METABOLIC PANEL - Abnormal; Notable for the following components:   Sodium 133 (*)    Glucose, Bld 113 (*)    Calcium 8.4 (*)    Total Protein 5.9 (*)    Alkaline Phosphatase 30 (*)    All other components within normal limits  RETICULOCYTES - Abnormal; Notable for the following components:   RBC. 2.47 (*)    All other components within normal limits  VITAMIN B12  FOLATE  IRON AND TIBC  FERRITIN  POC OCCULT BLOOD, ED  TYPE AND SCREEN  PREPARE RBC (CROSSMATCH)  ABO/RH   Procedures Procedures  CRITICAL CARE Performed by: Delora Fuel Total critical care time: 35 minutes Critical care time was exclusive of separately billable procedures and treating other patients. Critical care was necessary to treat or prevent imminent or  life-threatening deterioration. Critical care was time spent personally by me on the following activities: development of treatment plan with patient and/or surrogate as well as nursing, discussions with consultants, evaluation of patient's response to treatment, examination of patient, obtaining history from patient or surrogate, ordering and performing treatments and interventions, ordering and review of laboratory studies, ordering and review of radiographic studies, pulse oximetry and re-evaluation of patient's condition.  Medications Ordered in ED Medications  0.9 %  sodium chloride infusion (has no administration in time range)    ED Course  I have reviewed the triage vital signs and the nursing notes.  Pertinent lab results that were available during my care of the patient were reviewed by me and considered in my medical decision making (see chart for details).  MDM Rules/Calculators/A&P Report of anemia.  Old records are reviewed, and there is a note from cardiology that hemoglobin at [place] was 5.6.  Last hemoglobin in our record was 11.1 on August 21, but she did have hemoglobin of 9.9 on August 10.  Upper endoscopy on August 25 showed a few esophageal erosions, colonoscopy on same date showed diverticulosis and 2 small polyps which were removed.  Iron studies at that time showed normal total iron but low iron saturation ratio.  We will check orthostatic vital signs, stool Hemoccult.  Stool Hemoccult is negative.  Labs here confirm low hemoglobin which is 5.3.  RBC indices are slightly microcytic and RDW is elevated.  Anemia panel has been sent.  Blood is ordered for transfusion.  Case is discussed with Dr. Olevia Bowens of [facility], who agrees to admit the patient.  Final Clinical Impression(s) / ED Diagnoses Final diagnoses:  Symptomatic anemia    Rx / DC Orders ED Discharge Orders    None       Delora Fuel, MD [id number]    Delora Fuel, MD [id number] [phone]

## 2019-03-24 NOTE — ED Notes (Signed)
Pt's second unit of blood stated the patient did not have any reaction to the first but will continue to monitor closely.

## 2019-03-24 NOTE — ED Notes (Signed)
AC called for IV module for blood transfusion

## 2019-03-24 NOTE — Plan of Care (Signed)
  Problem: Education: Goal: Knowledge of General Education information will improve Description Including pain rating scale, medication(s)/side effects and non-pharmacologic comfort measures Outcome: Progressing   Problem: Health Behavior/Discharge Planning: Goal: Ability to manage health-related needs will improve Outcome: Progressing   

## 2019-03-24 NOTE — ED Triage Notes (Addendum)
Pt reports she received call from cardiologist office at [place] this morning to report her "bloodwork was dangerously low" and she "may need blood". Pt report some dizziness and shortness of breath that started on Dec 11. Pt has seen family doctor, urgent care, seen in this ER, and saw cardiologist (Dr Arnold Long) in [place] today. MD wrote new Rx for pt today to get her back on BP meds that she was taken off of by urgent care MD.

## 2019-03-24 NOTE — ED Notes (Signed)
Date and time results received: 03/24/19 4:01 AM (use smartphrase ".now" to insert current time)  Test: Hbg Critical Value: 5.3  Name of Provider Notified: Dr Roxanne Mins  Orders Received? Or Actions Taken?: see chart

## 2019-03-24 NOTE — Progress Notes (Signed)
*  PRELIMINARY RESULTS* Echocardiogram 2D Echocardiogram has been performed.  Samuel Germany 03/24/2019, 4:54 PM

## 2019-03-25 DIAGNOSIS — E782 Mixed hyperlipidemia: Secondary | ICD-10-CM | POA: Diagnosis not present

## 2019-03-25 DIAGNOSIS — E538 Deficiency of other specified B group vitamins: Secondary | ICD-10-CM | POA: Diagnosis not present

## 2019-03-25 DIAGNOSIS — I10 Essential (primary) hypertension: Secondary | ICD-10-CM | POA: Diagnosis not present

## 2019-03-25 DIAGNOSIS — D649 Anemia, unspecified: Secondary | ICD-10-CM | POA: Diagnosis not present

## 2019-03-25 LAB — BASIC METABOLIC PANEL
Anion gap: 10 (ref 5–15)
BUN: 8 mg/dL (ref 8–23)
CO2: 24 mmol/L (ref 22–32)
Calcium: 8.8 mg/dL — ABNORMAL LOW (ref 8.9–10.3)
Chloride: 101 mmol/L (ref 98–111)
Creatinine, Ser: 0.62 mg/dL (ref 0.44–1.00)
GFR calc Af Amer: >60 mL/min (ref >60)
GFR calc non Af Amer: >60 mL/min (ref >60)
Glucose, Bld: 94 mg/dL (ref 70–99)
Potassium: 3.8 mmol/L (ref 3.5–5.1)
Sodium: 135 mmol/L (ref 135–145)

## 2019-03-25 LAB — PREPARE RBC (CROSSMATCH)

## 2019-03-25 LAB — CBC
HCT: 23.4 % — ABNORMAL LOW (ref 36.0–46.0)
Hemoglobin: 6.9 g/dL — CL (ref 12.0–15.0)
MCH: 22.8 pg — ABNORMAL LOW (ref 26.0–34.0)
MCHC: 29.5 g/dL — ABNORMAL LOW (ref 30.0–36.0)
MCV: 77.5 fL — ABNORMAL LOW (ref 80.0–100.0)
Platelets: 237 10*3/uL (ref 150–400)
RBC: 3.02 MIL/uL — ABNORMAL LOW (ref 3.87–5.11)
RDW: 20.6 % — ABNORMAL HIGH (ref 11.5–15.5)
WBC: 6.6 10*3/uL (ref 4.0–10.5)
nRBC: 0 % (ref 0.0–0.2)

## 2019-03-25 MED ORDER — SODIUM CHLORIDE 0.9% IV SOLUTION: Freq: Once | INTRAVENOUS | Status: DC

## 2019-03-25 MED ORDER — CYANOCOBALAMIN 2000 MCG PO TABS: 2000.0000 ug | ORAL_TABLET | Freq: Every day | ORAL | Status: DC

## 2019-03-25 MED ORDER — BISOPROLOL FUMARATE 5 MG PO TABS
2.5000 mg | ORAL_TABLET | Freq: Every day | ORAL | Status: DC
  Administered 2019-03-25: 2.5 mg via ORAL
  Filled 2019-03-25: qty 1

## 2019-03-25 MED ORDER — LISINOPRIL 5 MG PO TABS
5.0000 mg | ORAL_TABLET | Freq: Every day | ORAL | Status: DC
  Administered 2019-03-25: 5 mg via ORAL
  Filled 2019-03-25: qty 1

## 2019-03-25 NOTE — Care Management Obs Status (Signed)
MEDICARE OBSERVATION STATUS NOTIFICATION   Patient Details  Name: Stacey Reyes MRN: [id number] Date of Birth: [birth year]   Medicare Observation Status Notification Given:  Yes    Tommy Medal 03/25/2019, 11:56 AM

## 2019-03-25 NOTE — Discharge Instructions (Signed)
Please see your primary care physician in 1 week and have your CBC rechecked.  Please follow-up with your gastroenterologist in the next 2 weeks.  Please cancel the outpatient echocardiogram you have scheduled as you had the procedure done while you were in the hospital.    Anemia  Anemia is a condition in which you do not have enough red blood cells or hemoglobin. Hemoglobin is a substance in red blood cells that carries oxygen. When you do not have enough red blood cells or hemoglobin (are anemic), your body cannot get enough oxygen and your organs may not work properly. As a result, you may feel very tired or have other problems. What are the causes? Common causes of anemia include:  Excessive bleeding. Anemia can be caused by excessive bleeding inside or outside the body, including bleeding from the intestine or from periods in women.  Poor nutrition.  Long-lasting (chronic) kidney, thyroid, and liver disease.  Bone marrow disorders.  Cancer and treatments for cancer.  HIV (human immunodeficiency virus) and AIDS (acquired immunodeficiency syndrome).  Treatments for HIV and AIDS.  Spleen problems.  Blood disorders.  Infections, medicines, and autoimmune disorders that destroy red blood cells. What are the signs or symptoms? Symptoms of this condition include:  Minor weakness.  Dizziness.  Headache.  Feeling heartbeats that are irregular or faster than normal (palpitations).  Shortness of breath, especially with exercise.  Paleness.  Cold sensitivity.  Indigestion.  Nausea.  Difficulty sleeping.  Difficulty concentrating. Symptoms may occur suddenly or develop slowly. If your anemia is mild, you may not have symptoms. How is this diagnosed? This condition is diagnosed based on:  Blood tests.  Your medical history.  A physical exam.  Bone marrow biopsy. Your health care provider may also check your stool (feces) for blood and may do additional  testing to look for the cause of your bleeding. You may also have other tests, including:  Imaging tests, such as a CT scan or MRI.  Endoscopy.  Colonoscopy. How is this treated? Treatment for this condition depends on the cause. If you continue to lose a lot of blood, you may need to be treated at a hospital. Treatment may include:  Taking supplements of iron, vitamin F27, or folic acid.  Taking a hormone medicine (erythropoietin) that can help to stimulate red blood cell growth.  Having a blood transfusion. This may be needed if you lose a lot of blood.  Making changes to your diet.  Having surgery to remove your spleen. Follow these instructions at home:  Take over-the-counter and prescription medicines only as told by your health care provider.  Take supplements only as told by your health care provider.  Follow any diet instructions that you were given.  Keep all follow-up visits as told by your health care provider. This is important. Contact a health care provider if:  You develop new bleeding anywhere in the body. Get help right away if:  You are very weak.  You are short of breath.  You have pain in your abdomen or chest.  You are dizzy or feel faint.  You have trouble concentrating.  You have bloody or black, tarry stools.  You vomit repeatedly or you vomit up blood. Summary  Anemia is a condition in which you do not have enough red blood cells or enough of a substance in your red blood cells that carries oxygen (hemoglobin).  Symptoms may occur suddenly or develop slowly.  If your anemia is mild, you may  not have symptoms.  This condition is diagnosed with blood tests as well as a medical history and physical exam. Other tests may be needed.  Treatment for this condition depends on the cause of the anemia. This information is not intended to replace advice given to you by your health care provider. Make sure you discuss any questions you have with  your health care provider. Document Revised: 02/21/2017 Document Reviewed: 04/12/2016 Elsevier Patient Education  [place].    IMPORTANT INFORMATION: PAY CLOSE ATTENTION   PHYSICIAN DISCHARGE INSTRUCTIONS  Follow with Primary care provider  [name], DO  and other consultants as instructed by your Hospitalist Physician  [facility], WORSEN OR NEW PROBLEM DEVELOPS   Please note: You were cared for by a hospitalist during your hospital stay. Every effort will be made to forward records to your primary care provider.  You can request that your primary care provider send for your hospital records if they have not received them.  Once you are discharged, your primary care physician will handle any further medical issues. Please note that NO REFILLS for any discharge medications will be authorized once you are discharged, as it is imperative that you return to your primary care physician (or establish a relationship with a primary care physician if you do not have one) for your post hospital discharge needs so that they can reassess your need for medications and monitor your lab values.  Please get a complete blood count and chemistry panel checked by your Primary MD at your next visit, and again as instructed by your Primary MD.  Get Medicines reviewed and adjusted: Please take all your medications with you for your next visit with your Primary MD  Laboratory/radiological data: Please request your Primary MD to go over all hospital tests and procedure/radiological results at the follow up, please ask your primary care provider to get all Hospital records sent to his/her office.  In some cases, they will be blood work, cultures and biopsy results pending at the time of your discharge. Please request that your primary care provider follow up on these results.  If you are diabetic, please bring your blood sugar readings  with you to your follow up appointment with primary care.    Please call and make your follow up appointments as soon as possible.    Also Note the following: If you experience worsening of your admission symptoms, develop shortness of breath, life threatening emergency, suicidal or homicidal thoughts you must seek medical attention immediately by calling 911 or calling your MD immediately  if symptoms less severe.  You must read complete instructions/literature along with all the possible adverse reactions/side effects for all the Medicines you take and that have been prescribed to you. Take any new Medicines after you have completely understood and accpet all the possible adverse reactions/side effects.   Do not drive when taking Pain medications or sleeping medications (Benzodiazepines)  Do not take more than prescribed Pain, Sleep and Anxiety Medications. It is not advisable to combine anxiety,sleep and pain medications without talking with your primary care practitioner  Special Instructions: If you have smoked or chewed Tobacco  in the last 2 yrs please stop smoking, stop any regular Alcohol  and or any Recreational drug use.  Wear Seat belts while driving.  Do not drive if taking any narcotic, mind altering or controlled substances or recreational drugs or alcohol.

## 2019-03-25 NOTE — Discharge Summary (Signed)
Physician Discharge Summary  Stacey Reyes [id number] DOB: 02/20/1946 DOA: 03/24/2019  PCP: [name], DO  Admit date: 03/24/2019 Discharge date: 03/25/2019  Admitted From:  Home  Disposition:  Home   Recommendations for Outpatient Follow-up:  1. Follow up with PCP in 1 weeks 2. Please follow up with GI in 2 weeks 3. Establish care with hematology in 1 month 4. Please obtain CBC in 1-2 weeks to follow hemoglobin 5. Please cancel the scheduled outpatient echocardiogram as you had the procedure done while you are in the hospital. 6. Please continue B12 injections to treat severe B12 deficiency  Discharge Condition: STABLE   CODE STATUS: FULL    Brief Hospitalization Summary: Please see all hospital notes, images, labs for full details of the hospitalization. ADMISSION HPI:  Stacey Reyes is a 73 y.o. female with medical history of hypertension, hyperlipidemia, and asthma presenting with approximately 1 month history of shortness of breath that has significantly worsened in the past 2 weeks.  The patient denies any fevers, chills, coughing, hemoptysis, nausea, vomiting, diarrhea, abdominal pain, hematochezia, melena.  The patient went to see her cardiologist on 03/23/2019.  Routine blood work was obtained and showed hemoglobin 5.6.  She was referred to the emergency department for further evaluation.  The patient denies anyaspirin, or anticoagulant usage.  However, she states that she has rarely used some ibuprofen approximately 2 weeks ago for some back pain.  However she states that she has not used any ibuprofen in the past week.  She was seen in urgent care on 03/05/2019.  She was noted to have a soft blood pressure with systolic 99.  She was instructed to stop her lisinopril/HCTZ.  When she followed up with cardiology yesterday, the patient was started on bisoprolol 2.5 mg and lisinopril 5 mg daily which she took prior to coming to the emergency department. Notably,  the patient had EGD on 11/17/2018 which showed duodenal erosions.  The pathology from the biopsy showed benign small bowel mucosa.  Colonoscopy on 11/17/2018 showed two diminutive polyps in the transverse colon.  Pathology showed tubular adenomas.  There was sigmoid diverticulosis. In the emergency department, the patient was afebrile hemodynamically stable with oxygen saturation 100% on room air.  BMP and LFTs were essentially unremarkable.  Hemoglobin was 5.3.  WBC 8.3, platelets 264,000.  The patient was transfused 2 units PRBC.  FOBT was negative.  The patient was admitted with a severe symptomatic anemia and B12 deficiency.  Her hemoglobin was down to 5.3.  Her MCV was low at 75.9.  Her vitamin B12 was severely low at 145.  She was transfused 2 units of packed red blood cells and her hemoglobin improved to 6.9.  Being less than 7 she was given 1 additional unit of packed red blood cells prior to discharge.  She was also given vitamin B12 injections x2.  She will follow-up with her primary care provider to continue vitamin B12 injections.  She recently had a full GI work-up earlier this year in August with results noted above.  I have asked for her to establish care with hematologist Dr. Delton Coombes so that she can follow-up on her severe anemia and received treatments further work-up as needed.  She feels much better after the packed red blood cell transfusion.  She agrees to follow-up outpatient and she is requesting to discharge home today.  I told her that she could discharge after she completes the last unit of packed red blood cells.  I would  like for her to follow-up with her PCP to have her CBC rechecked in 1 week.  She also has follow-up with her cardiologist and also GI.  Echocardiogram results are noted below.  She can cancel the outpatient echo that was scheduled for her in January 2021.   Echocardiogram 03/24/19 1. Left ventricular ejection fraction, by visual estimation, is >75%. The left  ventricle has hyperdynamic function. There is severely increased left ventricular hypertrophy.  2. Elevated left atrial pressure.  3. Left ventricular diastolic parameters are consistent with Grade I diastolic dysfunction (impaired relaxation).  4. The left ventricle has no regional wall motion abnormalities.  5. There is a dynamic LVOT gradient in the setting of severe symmetricla hypertrophy and hyperdynamic LV function leading to [facility] of the anterior mitral valve leaflet. Difficult to discern the peak graident, approximately appears to be 20 mmHg with Valsalva.  6. Global right ventricle has normal systolic function.The right ventricular size is normal. No increase in right ventricular wall thickness.  7. Left atrial size was severely dilated.  8. Right atrial size was normal.  9. The mitral valve is normal in structure. Mild mitral valve regurgitation. No evidence of mitral stenosis. 10. The tricuspid valve is normal in structure. 11. The aortic valve is tricuspid. Aortic valve regurgitation is not visualized. Mild aortic valve stenosis. 12. The pulmonic valve was not well visualized. Pulmonic valve regurgitation is not visualized. 13. The inferior vena cava is normal in size with greater than 50% respiratory variability, suggesting right atrial pressure of 3 mmHg.  Discharge Diagnoses:  Active Problems:   Essential hypertension   Hyperlipidemia   Acute blood loss anemia   Symptomatic anemia   Discharge Instructions:  Allergies as of 03/25/2019      Reactions   Sulfur Rash      Medication List    STOP taking these medications   doxycycline 100 MG tablet Commonly known as: VIBRA-TABS     TAKE these medications   albuterol 108 (90 Base) MCG/ACT inhaler Commonly known as: VENTOLIN HFA Inhale 2 puffs into the lungs every 6 (six) hours as needed for wheezing or shortness of breath.   bisoprolol 5 MG tablet Commonly known as: ZEBETA Take 0.5 tablets (2.5 mg total) by mouth daily.    lisinopril 5 MG tablet Commonly known as: ZESTRIL Take 1 tablet (5 mg total) by mouth daily.   Magnesium 200 MG Tabs Take 1 tablet (200 mg total) by mouth daily.   multivitamin capsule Take 1 capsule by mouth daily.   Rosuvastatin Calcium 10 MG Cpsp Take 10 mg by mouth at bedtime.   thiamine 100 MG tablet Commonly known as: VITAMIN B-1 Take 100 mg by mouth daily.   vitamin E 400 UNIT capsule Take 400 Units by mouth daily.      Follow-up Information    Ronnie Doss M, DO. Schedule an appointment as soon as possible for a visit in 1 week(s).   Specialty: Family Medicine Why: Hospital Follow up and check CBC Contact information: [address] [id number] [phone]        Gastroenterologist. Schedule an appointment as soon as possible for a visit in 2 week(s).   Why: Follow up with your GI in 2 weeks for recheck regarding anemia       Derek Jack, MD. Schedule an appointment as soon as possible for a visit in 1 month(s).   Specialty: Hematology Why: Establish care regarding severe anemia  Contact information: [address]  [place] [address] [phone]          Allergies  Allergen Reactions  . Sulfur Rash   Allergies as of 03/25/2019      Reactions   Sulfur Rash      Medication List    STOP taking these medications   doxycycline 100 MG tablet Commonly known as: VIBRA-TABS     TAKE these medications   albuterol 108 (90 Base) MCG/ACT inhaler Commonly known as: VENTOLIN HFA Inhale 2 puffs into the lungs every 6 (six) hours as needed for wheezing or shortness of breath.   bisoprolol 5 MG tablet Commonly known as: ZEBETA Take 0.5 tablets (2.5 mg total) by mouth daily.   lisinopril 5 MG tablet Commonly known as: ZESTRIL Take 1 tablet (5 mg total) by mouth daily.   Magnesium 200 MG Tabs Take 1 tablet (200 mg total) by mouth daily.   multivitamin capsule Take 1 capsule by mouth daily.   Rosuvastatin Calcium 10 MG  Cpsp Take 10 mg by mouth at bedtime.   thiamine 100 MG tablet Commonly known as: VITAMIN B-1 Take 100 mg by mouth daily.   vitamin E 400 UNIT capsule Take 400 Units by mouth daily.       Procedures/Studies: DG Chest 2 View  Result Date: 03/09/2019 CLINICAL DATA:  Shortness of breath 1-2 months getting worse, history hypertension EXAM: CHEST - 2 VIEW COMPARISON:  04/21/2018 FINDINGS: Enlargement of cardiac silhouette. Mediastinal contours and pulmonary vascularity normal. Atherosclerotic calcification aorta. Lungs mildly hyperinflated but clear. No pulmonary infiltrate, pleural effusion or pneumothorax. Scattered endplate spur formation thoracic spine. IMPRESSION: Mild hyperinflation without pulmonary infiltrate. Minimal enlargement of cardiac silhouette. Aortic Atherosclerosis (ICD10-I70.0). Electronically Signed   By: Lavonia Dana M.D.   On: 03/09/2019 14:52   CT ANGIO CHEST PE W OR WO CONTRAST  Result Date: 03/15/2019 CLINICAL DATA:  Shortness of breath for 2 weeks. EXAM: CT ANGIOGRAPHY CHEST WITH CONTRAST TECHNIQUE: Multidetector CT imaging of the chest was performed using the standard protocol during bolus administration of intravenous contrast. Multiplanar CT image reconstructions and MIPs were obtained to evaluate the vascular anatomy. CONTRAST:  167mL OMNIPAQUE IOHEXOL 350 MG/ML SOLN COMPARISON:  Chest radiograph 03/09/2019 FINDINGS: Cardiovascular: Coronary, aortic arch, and branch vessel atherosclerotic vascular disease. No filling defect is identified in the pulmonary arterial tree to suggest pulmonary embolus. Mild to moderate cardiomegaly. Mediastinum/Nodes: Small hilar lymph nodes are not pathologically enlarged by size criteria. Lungs/Pleura: Small bilateral pleural effusions. Biapical pleuroparenchymal scarring. 0.6 by 0.4 cm nodule in the apical segment right upper lobe, image 22/10. 0.6 by 0.5 cm superior segment right lower lobe nodule on image 54/10. 0.5 by 0.4 cm subpleural  nodule along the minor fissure on image 72/10. 0.6 by 0.4 cm posterior basal segment right lower lobe nodule, image 105/10. 0.5 by 0.5 cm left lower lobe nodule on image 66/10. Left upper lobe nodules in the 3 mm diameter range noted. Additional smaller right upper lobe nodules are present. Upper Abdomen: Borderline enlarged porta hepatis lymph node 1.0 cm in diameter on image 96/8. Musculoskeletal: None 0 humeral spurring bilaterally. Thoracic spondylosis. Review of the MIP images confirms the above findings. IMPRESSION: 1. No filling defect is identified in the pulmonary arterial tree to suggest pulmonary embolus. 2. Scattered small pulmonary nodules averaging up to 0.5 cm in diameter. No follow-up needed if patient is low-risk (and has no known or suspected primary neoplasm). Non-contrast chest CT can be considered in 12 months if patient is high-risk. This recommendation follows  the consensus statement: Guidelines for Management of Incidental Pulmonary Nodules Detected on CT Images: From the Fleischner Society 2017; Radiology 2017; 284:228-243. 3. Small bilateral pleural effusions. 4. Mild to moderate cardiomegaly. 5. Aortic atherosclerosis. Coronary atherosclerosis. 6. Borderline enlarged porta hepatis lymph node, significance uncertain. Aortic Atherosclerosis (ICD10-I70.0). Electronically Signed   By: Van Clines M.D.   On: 03/15/2019 15:48   ECHOCARDIOGRAM COMPLETE  Result Date: 03/24/2019   ECHOCARDIOGRAM REPORT   Patient Name:   Stacey Reyes Date of Exam: 03/24/2019 Medical Rec #:  [id number]         Height:       66.0 in Accession #:    [id number]        Weight:       191.0 lb Date of Birth:  13-Jan-1946         BSA:          1.96 m Patient Age:    56 years          BP:           149/71 mmHg Patient Gender: F                 HR:           76 bpm. Exam Location:  [facility] Procedure: 2D Echo, Cardiac Doppler and Color Doppler Indications:    Dyspnea 786.09 / R06.00  History:        Patient  has prior history of Echocardiogram examinations, most                 recent 10/20/2017. Risk Factors:Hypertension and Dyslipidemia.                 LVH (left ventricular hypertrophy).  Sonographer:    Alvino Chapel RCS Referring Phys: [phone] DAVID TAT IMPRESSIONS  1. Left ventricular ejection fraction, by visual estimation, is >75%. The left ventricle has hyperdynamic function. There is severely increased left ventricular hypertrophy.  2. Elevated left atrial pressure.  3. Left ventricular diastolic parameters are consistent with Grade I diastolic dysfunction (impaired relaxation).  4. The left ventricle has no regional wall motion abnormalities.  5. There is a dynamic LVOT gradient in the setting of severe symmetricla hypertrophy and hyperdynamic LV function leading to [facility] of the anterior mitral valve leaflet. Difficult to discern the peak graident, approximately appears to be 20 mmHg with Valsalva.  6. Global right ventricle has normal systolic function.The right ventricular size is normal. No increase in right ventricular wall thickness.  7. Left atrial size was severely dilated.  8. Right atrial size was normal.  9. The mitral valve is normal in structure. Mild mitral valve regurgitation. No evidence of mitral stenosis. 10. The tricuspid valve is normal in structure. 11. The aortic valve is tricuspid. Aortic valve regurgitation is not visualized. Mild aortic valve stenosis. 12. The pulmonic valve was not well visualized. Pulmonic valve regurgitation is not visualized. 13. The inferior vena cava is normal in size with greater than 50% respiratory variability, suggesting right atrial pressure of 3 mmHg. FINDINGS  Left Ventricle: Left ventricular ejection fraction, by visual estimation, is >75%. The left ventricle has hyperdynamic function. The left ventricle has no regional wall motion abnormalities. There is severely increased left ventricular hypertrophy. Left  ventricular diastolic parameters are consistent with  Grade I diastolic dysfunction (impaired relaxation). Elevated left atrial pressure. There is a dynamic LVOT gradient in the setting of severe symmetricla hypertrophy and hyperdynamic LV function leading to [facility] of  the anterior mitral valve leaflet. Difficult to discern the peak graident, approximately appears to be 20 mmHg with Valsalva. Right Ventricle: The right ventricular size is normal. No increase in right ventricular wall thickness. Global RV systolic function is has normal systolic function. Left Atrium: Left atrial size was severely dilated. Right Atrium: Right atrial size was normal in size Pericardium: There is no evidence of pericardial effusion. Mitral Valve: The mitral valve is normal in structure. Mild mitral valve regurgitation. No evidence of mitral valve stenosis by observation. MV peak gradient, 18.8 mmHg. Tricuspid Valve: The tricuspid valve is normal in structure. Tricuspid valve regurgitation is not demonstrated. Aortic Valve: The aortic valve is tricuspid. . There is mild thickening and mild calcification of the aortic valve. Aortic valve regurgitation is not visualized. Mild aortic stenosis is present. Mild aortic valve annular calcification. There is mild thickening of the aortic valve. There is mild calcification of the aortic valve. Pulmonic Valve: The pulmonic valve was not well visualized. Pulmonic valve regurgitation is not visualized. Pulmonic regurgitation is not visualized. No evidence of pulmonic stenosis. Aorta: The aortic root is normal in size and structure. Pulmonary Artery: Indeterminant PASP, inadequate TR jet. Venous: The inferior vena cava is normal in size with greater than 50% respiratory variability, suggesting right atrial pressure of 3 mmHg. IAS/Shunts: No atrial level shunt detected by color flow Doppler.  LEFT VENTRICLE PLAX 2D LVIDd:         4.36 cm       Diastology LVIDs:         1.95 cm       LV e' lateral:   5.87 cm/s LV PW:         2.11 cm       LV E/e' lateral:  18.4 LV IVS:        1.70 cm       LV e' medial:    6.53 cm/s LVOT diam:     2.00 cm       LV E/e' medial:  16.5 LV SV:         74 ml LV SV Index:   36.29 LVOT Area:     3.14 cm  LV Volumes (MOD) LV area d, A2C:    36.20 cm LV area d, A4C:    31.85 cm LV area s, A2C:    21.00 cm LV area s, A4C:    16.70 cm LV major d, A2C:   9.00 cm LV major d, A4C:   8.92 cm LV major s, A2C:   8.10 cm LV major s, A4C:   7.65 cm LV vol d, MOD A2C: 123.0 ml LV vol d, MOD A4C: 92.8 ml LV vol s, MOD A2C: 47.7 ml LV vol s, MOD A4C: 35.5 ml LV SV MOD A2C:     75.3 ml LV SV MOD A4C:     92.8 ml LV SV MOD BP:      63.9 ml RIGHT VENTRICLE RV S prime:     17.00 cm/s TAPSE (M-mode): 2.9 cm LEFT ATRIUM              Index       RIGHT ATRIUM           Index LA diam:        4.20 cm  2.14 cm/m  RA Area:     21.10 cm LA Vol (A2C):   134.0 ml 68.33 ml/m RA Volume:   64.30 ml  32.79 ml/m LA Vol (A4C):  79.4 ml  40.49 ml/m LA Biplane Vol: 109.0 ml 55.58 ml/m   AORTA Ao Root diam: 3.70 cm MITRAL VALVE MV Area (PHT): 2.20 cm              SHUNTS MV Peak grad:  18.8 mmHg             Systemic Diam: 2.00 cm MV Mean grad:  12.0 mmHg MV Vmax:       2.17 m/s MV Vmean:      165.0 cm/s MV VTI:        0.58 m MV PHT:        100.05 msec MV Decel Time: 345 msec MV E velocity: 108.00 cm/s 103 cm/s MV A velocity: 129.00 cm/s 70.3 cm/s MV E/A ratio:  0.84        1.5  Carlyle Dolly MD Electronically signed by Carlyle Dolly MD Signature Date/Time: 03/24/2019/6:12:35 PM    Final      Subjective: The patient reports that she feels much better after the packed red blood cell transfusion and she would like to go home.  She is no longer having shortness of breath or chest pain or any other symptoms.  Discharge Exam: Vitals:   03/24/19 2305 03/25/19 0556  BP: (!) 155/56 (!) 145/56  Pulse: 64 62  Resp: 18 16  Temp: 98.7 F (37.1 C) 98.4 F (36.9 C)  SpO2: 99% 97%   Vitals:   03/24/19 1415 03/24/19 1500 03/24/19 2305 03/25/19 0556  BP:  (!)  149/71 (!) 155/56 (!) 145/56  Pulse: 68 65 64 62  Resp: 16 16 18 16   Temp:  98 F (36.7 C) 98.7 F (37.1 C) 98.4 F (36.9 C)  TempSrc:  Oral Oral Oral  SpO2: 99% 100% 99% 97%  Weight:      Height:       General: Pt is alert, awake, not in acute distress Cardiovascular: RRR, S1/S2 +, no rubs, no gallops Respiratory: CTA bilaterally, no wheezing, no rhonchi Abdominal: Soft, NT, ND, bowel sounds + Extremities: no edema, no cyanosis   The results of significant diagnostics from this hospitalization (including imaging, microbiology, ancillary and laboratory) are listed below for reference.     Microbiology: Recent Results (from the past 240 hour(s))  SARS CORONAVIRUS 2 (TAT 6-24 HRS) Nasopharyngeal Nasopharyngeal Swab     Status: None   Collection Time: 03/24/19  8:08 AM   Specimen: Nasopharyngeal Swab  Result Value Ref Range Status   SARS Coronavirus 2 NEGATIVE NEGATIVE Final    Comment: (NOTE) SARS-CoV-2 target nucleic acids are NOT DETECTED. The SARS-CoV-2 RNA is generally detectable in upper and lower respiratory specimens during the acute phase of infection. Negative results do not preclude SARS-CoV-2 infection, do not rule out co-infections with other pathogens, and should not be used as the sole basis for treatment or other patient management decisions. Negative results must be combined with clinical observations, patient history, and epidemiological information. The expected result is Negative. Fact Sheet for Patients: SugarRoll.be Fact Sheet for Healthcare Providers: https://www.woods-mathews.com/ This test is not yet approved or cleared by the [place] FDA and  has been authorized for detection and/or diagnosis of SARS-CoV-2 by FDA under an Emergency Use Authorization (EUA). This EUA will remain  in effect (meaning this test can be used) for the duration of the COVID-19 declaration under Section 56 4(b)(1) of the Act, 21  U.S.C. section 360bbb-3(b)(1), unless the authorization is terminated or revoked sooner. Performed at [facility], [place]  [address] [place], [place] [address]      Labs: BNP (last 3 results) Recent Labs    03/24/19 1555  BNP 123XX123*   Basic Metabolic Panel: Recent Labs  Lab 03/23/19 1638 03/24/19 0331 03/25/19 0549  NA 137 133* 135  K 4.5 3.6 3.8  CL 102 104 101  CO2 23 22 24   GLUCOSE 102* 113* 94  BUN 13 14 8   CREATININE 0.74 0.65 0.62  CALCIUM 9.5 8.4* 8.8*  MG 2.1  --   --    Liver Function Tests: Recent Labs  Lab 03/24/19 0331  AST 15  ALT 14  ALKPHOS 30*  BILITOT 0.7  PROT 5.9*  ALBUMIN 3.6   No results for input(s): LIPASE, AMYLASE in the last 168 hours. No results for input(s): AMMONIA in the last 168 hours. CBC: Recent Labs  Lab 03/23/19 1638 03/24/19 0331 03/25/19 0549  WBC 9.9 8.3 6.6  NEUTROABS  --  3.8  --   HGB 5.6* 5.3* 6.9*  HCT 19.2* 18.9* 23.4*  MCV 74* 75.9* 77.5*  PLT 307 264 237   Cardiac Enzymes: No results for input(s): CKTOTAL, CKMB, CKMBINDEX, TROPONINI in the last 168 hours. BNP: Invalid input(s): POCBNP CBG: No results for input(s): GLUCAP in the last 168 hours. D-Dimer No results for input(s): DDIMER in the last 72 hours. Hgb A1c No results for input(s): HGBA1C in the last 72 hours. Lipid Profile No results for input(s): CHOL, HDL, LDLCALC, TRIG, CHOLHDL, LDLDIRECT in the last 72 hours. Thyroid function studies Recent Labs    03/23/19 1638  TSH 1.020   Anemia work up Recent Labs    03/24/19 0331  VITAMINB12 145*  FOLATE 9.4  FERRITIN 6*  TIBC 490*  IRON 10*  RETICCTPCT 2.1   Urinalysis    Component Value Date/Time   APPEARANCEUR Cloudy (A) 12/12/2016 1758   GLUCOSEU Negative 12/12/2016 1758   BILIRUBINUR Negative 12/12/2016 1758   PROTEINUR 2+ (A) 12/12/2016 1758   UROBILINOGEN negative 02/24/2015 1344   NITRITE Negative 12/12/2016 1758   LEUKOCYTESUR 1+ (A) 12/12/2016 1758   Sepsis  Labs Invalid input(s): PROCALCITONIN,  WBC,  LACTICIDVEN Microbiology Recent Results (from the past 240 hour(s))  SARS CORONAVIRUS 2 (TAT 6-24 HRS) Nasopharyngeal Nasopharyngeal Swab     Status: None   Collection Time: 03/24/19  8:08 AM   Specimen: Nasopharyngeal Swab  Result Value Ref Range Status   SARS Coronavirus 2 NEGATIVE NEGATIVE Final    Comment: (NOTE) SARS-CoV-2 target nucleic acids are NOT DETECTED. The SARS-CoV-2 RNA is generally detectable in upper and lower respiratory specimens during the acute phase of infection. Negative results do not preclude SARS-CoV-2 infection, do not rule out co-infections with other pathogens, and should not be used as the sole basis for treatment or other patient management decisions. Negative results must be combined with clinical observations, patient history, and epidemiological information. The expected result is Negative. Fact Sheet for Patients: SugarRoll.be Fact Sheet for Healthcare Providers: https://www.woods-mathews.com/ This test is not yet approved or cleared by the [place] FDA and  has been authorized for detection and/or diagnosis of SARS-CoV-2 by FDA under an Emergency Use Authorization (EUA). This EUA will remain  in effect (meaning this test can be used) for the duration of the COVID-19 declaration under Section 56 4(b)(1) of the Act, 21 U.S.C. section 360bbb-3(b)(1), unless the authorization is terminated or revoked sooner. Performed at [facility], [place] [address], [place], [place] [address]     Time coordinating discharge:  SIGNED:  Irwin Brakeman, MD  [facility] 03/25/2019, 11:51 AM How to contact the [facility] Attending or Consulting provider [place] or covering provider during after hours [place], for this patient?  1. Check the care team in [facility] and look for a) attending/consulting [facility] provider listed and b) the [facility] team listed 2. Log into [email or web address]  and use [place]'s universal password to access. If you do not have the password, please contact the hospital operator. 3. Locate the [facility] provider you are looking for under [facility] and page to a number that you can be directly reached. 4. If you still have difficulty reaching the provider, please page the [facility] (Director on Call) for the Hospitalists listed on amion for assistance.

## 2019-03-25 NOTE — Progress Notes (Signed)
CRITICAL VALUE ALERT  Critical Value:  Hgb 6.9  Date & Time Notied:  03/25/19 [id number]  Provider Notified: Scherrie November MD  Orders Received/Actions taken: Awaiting response/orders at this time

## 2019-03-26 LAB — TYPE AND SCREEN
ABO/RH(D): O POS
Antibody Screen: NEGATIVE
Unit division: 0
Unit division: 0
Unit division: 0

## 2019-03-26 LAB — BPAM RBC
Blood Product Expiration Date: 202102022359
Blood Product Expiration Date: 202102022359
Blood Product Expiration Date: 202102042359
ISSUE DATE / TIME: 202012300722
ISSUE DATE / TIME: 202012301003
ISSUE DATE / TIME: 202012311159
Unit Type and Rh: 5100
Unit Type and Rh: 5100
Unit Type and Rh: 5100

## 2019-03-29 ENCOUNTER — Other Ambulatory Visit (INDEPENDENT_AMBULATORY_CARE_PROVIDER_SITE_OTHER): Payer: Self-pay

## 2019-03-29 DIAGNOSIS — R011 Cardiac murmur, unspecified: Secondary | ICD-10-CM

## 2019-03-29 DIAGNOSIS — I421 Obstructive hypertrophic cardiomyopathy: Secondary | ICD-10-CM

## 2019-03-29 DIAGNOSIS — I4891 Unspecified atrial fibrillation: Secondary | ICD-10-CM

## 2019-03-29 DIAGNOSIS — Z1321 Encounter for screening for nutritional disorder: Secondary | ICD-10-CM

## 2019-03-29 DIAGNOSIS — I517 Cardiomegaly: Secondary | ICD-10-CM

## 2019-03-29 DIAGNOSIS — I10 Essential (primary) hypertension: Secondary | ICD-10-CM

## 2019-03-29 NOTE — Telephone Encounter (Signed)
Thank you. I checked on her the day she went to ED.

## 2019-03-30 ENCOUNTER — Other Ambulatory Visit: Payer: Self-pay

## 2019-03-31 ENCOUNTER — Other Ambulatory Visit: Payer: Self-pay | Admitting: Family Medicine

## 2019-03-31 ENCOUNTER — Ambulatory Visit: Payer: BC Managed Care – PPO | Admitting: Family Medicine

## 2019-03-31 VITALS — BP 138/75 | HR 56 | Temp 97.8°F | Ht 66.0 in | Wt 190.0 lb

## 2019-03-31 DIAGNOSIS — Z23 Encounter for immunization: Secondary | ICD-10-CM

## 2019-03-31 DIAGNOSIS — D649 Anemia, unspecified: Secondary | ICD-10-CM

## 2019-03-31 DIAGNOSIS — Z09 Encounter for follow-up examination after completed treatment for conditions other than malignant neoplasm: Secondary | ICD-10-CM

## 2019-03-31 DIAGNOSIS — R0602 Shortness of breath: Secondary | ICD-10-CM

## 2019-03-31 LAB — CBC
Hematocrit: 32.9 % — ABNORMAL LOW (ref 34.0–46.6)
Hemoglobin: 10 g/dL — ABNORMAL LOW (ref 11.1–15.9)
MCH: 25 pg — ABNORMAL LOW (ref 26.6–33.0)
MCHC: 30.4 g/dL — ABNORMAL LOW (ref 31.5–35.7)
MCV: 82 fL (ref 79–97)
Platelets: 214 10*3/uL (ref 150–450)
RBC: 4 x10E6/uL (ref 3.77–5.28)
RDW: 21.8 % — ABNORMAL HIGH (ref 11.7–15.4)
WBC: 6.4 10*3/uL (ref 3.4–10.8)

## 2019-03-31 NOTE — Addendum Note (Signed)
Addended byCarrolyn Leigh on: 03/31/2019 12:34 PM   Modules accepted: Orders

## 2019-03-31 NOTE — Progress Notes (Signed)
Subjective: CC: Hospital follow up PCP: [name], DO RQ:[id number] KLOEE Stacey Reyes is a 74 y.o. female presenting to clinic today for:  1. Hospital follow up Patient was admitted in December for severe symptomatic anemia and B12 deficiency.  She was transfused with 2 units of packed red blood cells due to hemoglobin of 5.6.  Her hemoglobin only rose to 6.9 and therefore she was transfused with an additional unit of packed red blood cells as well as given 2 vitamin B12 injections.  She felt much better after the infusions and was subsequently discharged home.  Hemoglobin at time of discharge was not repeated.  Last known hemoglobin was 6.9.  She was instructed to follow-up with gastroenterology and cardiology on an outpatient basis as well as establish with hematology.   She notes that she is feeling much better since discharge from the hospital.  She does have some slight fatigue but nothing near what she went in with.  Denies any dyspnea on exertion, extremity edema, dizziness or lightheadedness.  No bleeding.  She has follow-up scheduled with all of her specialist as recommended.  She continues to take vitamin B12 orally and the magnesium which was recommended for restless leg.  The restless legs are feeling better with the magnesium.  She has minimal loose stools with this.  Additionally, she is not a vegetarian.  No history of abdominal surgeries.  No history of chronic diarrhea.  No known family history of blood dyscrasias, malabsorptive disorders   ROS: Per HPI  Allergies  Allergen Reactions  . Sulfur Rash   Past Medical History:  Diagnosis Date  . Cataract    removed bilateral  . Chronic cystitis   . Heart murmur   . Hyperlipidemia   . Hypertension   . Personal history of colonic polyps-adenomas 01/07/2012   2009 - 2 diminutive adenomas (prior polyps also) 01/07/2012 - 2 diminutive adenomas      Current Outpatient Medications:  .  albuterol (VENTOLIN HFA) 108 (90 Base)  MCG/ACT inhaler, Inhale 2 puffs into the lungs every 6 (six) hours as needed for wheezing or shortness of breath., Disp: 18 g, Rfl: 0 .  bisoprolol (ZEBETA) 5 MG tablet, Take 0.5 tablets (2.5 mg total) by mouth daily., Disp: 45 tablet, Rfl: 3 .  cyanocobalamin ([employer] VITAMIN B12) 2000 MCG tablet, Take 1 tablet (2,000 mcg total) by mouth daily., Disp:  , Rfl:  .  lisinopril (ZESTRIL) 5 MG tablet, Take 1 tablet (5 mg total) by mouth daily., Disp: 90 tablet, Rfl: 3 .  Magnesium 200 MG TABS, Take 1 tablet (200 mg total) by mouth daily., Disp: 30 tablet, Rfl: 6 .  Multiple Vitamin (MULTIVITAMIN) capsule, Take 1 capsule by mouth daily., Disp: , Rfl:  .  Rosuvastatin Calcium 10 MG CPSP, Take 10 mg by mouth at bedtime., Disp: 90 capsule, Rfl: 3 .  thiamine (VITAMIN B-1) 100 MG tablet, Take 100 mg by mouth daily., Disp: , Rfl:  .  vitamin E 400 UNIT capsule, Take 400 Units by mouth daily. , Disp: , Rfl:  Social History   Socioeconomic History  . Marital status: Married    Spouse name: Not on file  . Number of children: 1  . Years of education: Not on file  . Highest education level: Not on file  Occupational History  . Not on file  Tobacco Use  . Smoking status: Former Smoker    Quit date: 12/23/1984    Years since quitting: 34.2  . Smokeless tobacco:  Never Used  Substance and Sexual Activity  . Alcohol use: No  . Drug use: No  . Sexual activity: Not on file  Other Topics Concern  . Not on file  Social History Narrative  . Not on file   Social Determinants of Health   Financial Resource Strain:   . Difficulty of Paying Living Expenses: Not on file  Food Insecurity:   . Worried About Charity fundraiser in the Last Year: Not on file  . Ran Out of Food in the Last Year: Not on file  Transportation Needs:   . Lack of Transportation (Medical): Not on file  . Lack of Transportation (Non-Medical): Not on file  Physical Activity:   . Days of Exercise per Week: Not on file  . Minutes of  Exercise per Session: Not on file  Stress:   . Feeling of Stress : Not on file  Social Connections:   . Frequency of Communication with Friends and Family: Not on file  . Frequency of Social Gatherings with Friends and Family: Not on file  . Attends Religious Services: Not on file  . Active Member of Clubs or Organizations: Not on file  . Attends Archivist Meetings: Not on file  . Marital Status: Not on file  Intimate Partner Violence:   . Fear of Current or Ex-Partner: Not on file  . Emotionally Abused: Not on file  . Physically Abused: Not on file  . Sexually Abused: Not on file   Family History  Problem Relation Age of Onset  . Colon cancer Mother 2       80's  . Breast cancer Sister   . Colon polyps Neg Hx   . Esophageal cancer Neg Hx   . Rectal cancer Neg Hx   . Stomach cancer Neg Hx     Objective: Office vital signs reviewed. BP 138/75   Pulse (!) 56   Temp 97.8 F (36.6 C) (Temporal)   Ht 5\' 6"  (1.676 m)   Wt 190 lb (86.2 kg)   SpO2 97%   BMI 30.67 kg/m   Physical Examination:  General: Awake, alert, well nourished, No acute distress HEENT: Normal; no conjunctival pallor. Cardio: Slightly bradycardic with regular rhythm.  S1S2 heard, soft systolic murmur appreciated at right sternal border Pulm: clear to auscultation bilaterally, no wheezes, rhonchi or rales; normal work of breathing on room air Extremities: warm, well perfused, No edema, cyanosis or clubbing; +2 pulses bilaterally Neuro: Mentation is appropriate.  Patient is interactive and follows commands.  Assessment/ Plan: 74 y.o. female   1. Hospital discharge follow-up I reviewed her hospital course and discharge summary.  Repeat CBC.  Last known hemoglobin was 6.9 but this was prior to 1 additional unit of packed red blood cells which was transfused prior to discharge.  Will contact patient with results.  Encouraged her to follow-up with her specialist as discussed.  I suspect that they  will work her up for multiple malabsorptive disorder including pernicious anemia. - CBC  2. Symptomatic anemia Symptoms resolved.  Check hemoglobin - CBC   No orders of the defined types were placed in this encounter.  No orders of the defined types were placed in this encounter.    [name], DO [place] [phone]

## 2019-03-31 NOTE — Patient Instructions (Signed)
You had labs performed today.  You will be contacted with the results of the labs once they are available, usually in the next 3 business days for routine lab work.  If you have an active my chart account, they will be released to your MyChart.  If you prefer to have these labs released to you via telephone, please let us know.  If you had a pap smear or biopsy performed, expect to be contacted in about 7-10 days.  Send me a mychart message if you run into problems in the future.   Anemia  Anemia is a condition in which you do not have enough red blood cells or hemoglobin. Hemoglobin is a substance in red blood cells that carries oxygen. When you do not have enough red blood cells or hemoglobin (are anemic), your body cannot get enough oxygen and your organs may not work properly. As a result, you may feel very tired or have other problems. What are the causes? Common causes of anemia include:  Excessive bleeding. Anemia can be caused by excessive bleeding inside or outside the body, including bleeding from the intestine or from periods in women.  Poor nutrition.  Long-lasting (chronic) kidney, thyroid, and liver disease.  Bone marrow disorders.  Cancer and treatments for cancer.  HIV (human immunodeficiency virus) and AIDS (acquired immunodeficiency syndrome).  Treatments for HIV and AIDS.  Spleen problems.  Blood disorders.  Infections, medicines, and autoimmune disorders that destroy red blood cells. What are the signs or symptoms? Symptoms of this condition include:  Minor weakness.  Dizziness.  Headache.  Feeling heartbeats that are irregular or faster than normal (palpitations).  Shortness of breath, especially with exercise.  Paleness.  Cold sensitivity.  Indigestion.  Nausea.  Difficulty sleeping.  Difficulty concentrating. Symptoms may occur suddenly or develop slowly. If your anemia is mild, you may not have symptoms. How is this diagnosed? This  condition is diagnosed based on:  Blood tests.  Your medical history.  A physical exam.  Bone marrow biopsy. Your health care provider may also check your stool (feces) for blood and may do additional testing to look for the cause of your bleeding. You may also have other tests, including:  Imaging tests, such as a CT scan or MRI.  Endoscopy.  Colonoscopy. How is this treated? Treatment for this condition depends on the cause. If you continue to lose a lot of blood, you may need to be treated at a hospital. Treatment may include:  Taking supplements of iron, vitamin V95, or folic acid.  Taking a hormone medicine (erythropoietin) that can help to stimulate red blood cell growth.  Having a blood transfusion. This may be needed if you lose a lot of blood.  Making changes to your diet.  Having surgery to remove your spleen. Follow these instructions at home:  Take over-the-counter and prescription medicines only as told by your health care provider.  Take supplements only as told by your health care provider.  Follow any diet instructions that you were given.  Keep all follow-up visits as told by your health care provider. This is important. Contact a health care provider if:  You develop new bleeding anywhere in the body. Get help right away if:  You are very weak.  You are short of breath.  You have pain in your abdomen or chest.  You are dizzy or feel faint.  You have trouble concentrating.  You have bloody or black, tarry stools.  You vomit repeatedly or you  vomit up blood. Summary  Anemia is a condition in which you do not have enough red blood cells or enough of a substance in your red blood cells that carries oxygen (hemoglobin).  Symptoms may occur suddenly or develop slowly.  If your anemia is mild, you may not have symptoms.  This condition is diagnosed with blood tests as well as a medical history and physical exam. Other tests may be  needed.  Treatment for this condition depends on the cause of the anemia. This information is not intended to replace advice given to you by your health care provider. Make sure you discuss any questions you have with your health care provider. Document Revised: 02/21/2017 Document Reviewed: 04/12/2016 Elsevier Patient Education  [place].

## 2019-04-02 ENCOUNTER — Other Ambulatory Visit (HOSPITAL_COMMUNITY): Payer: BC Managed Care – PPO

## 2019-04-15 ENCOUNTER — Other Ambulatory Visit: Payer: Self-pay

## 2019-04-15 ENCOUNTER — Telehealth: Payer: Self-pay | Admitting: Family Medicine

## 2019-04-15 NOTE — Telephone Encounter (Signed)
Patient is complaining with severe dizziness and states the last time she felt this way her hempglobin was low. Appointment given for in the morning with Britney.

## 2019-04-16 ENCOUNTER — Encounter: Payer: Self-pay | Admitting: Family Medicine

## 2019-04-16 ENCOUNTER — Ambulatory Visit: Payer: BC Managed Care – PPO | Admitting: Family Medicine

## 2019-04-16 VITALS — BP 130/66 | HR 61 | Temp 98.0°F | Ht 66.0 in | Wt 189.2 lb

## 2019-04-16 DIAGNOSIS — D649 Anemia, unspecified: Secondary | ICD-10-CM | POA: Diagnosis not present

## 2019-04-16 DIAGNOSIS — R42 Dizziness and giddiness: Secondary | ICD-10-CM | POA: Diagnosis not present

## 2019-04-16 DIAGNOSIS — R5383 Other fatigue: Secondary | ICD-10-CM

## 2019-04-16 LAB — HEMOGLOBIN, FINGERSTICK: Hemoglobin: 11.5 g/dL (ref 11.1–15.9)

## 2019-04-16 NOTE — Progress Notes (Signed)
Assessment & Plan:  1. Fatigue, unspecified type/Dizziness - Hgb has increased further from 10.0 two weeks ago to 11.5; reassurance provided regarding her hgb. Discussed the possibility of her symptoms being the beginning of a viral illness and encouraged her to monitor for more symptoms. Encouraged patient to keep a close eye on her blood pressure and keep a log so that she can show it to her cardiologist and PCP.   - Hemoglobin, fingerstick  3. Symptomatic anemia - Resolved.  - Hemoglobin, fingerstick   Follow up plan: Return as scheduled.  Hendricks Limes, MSN, APRN, FNP-C [facility]  Subjective:   Patient ID: Stacey Reyes, female    DOB: 12/16/1945, 74 y.o.   MRN: [id number]  HPI: [name] is a 74 y.o. female presenting on 04/16/2019 for Fatigue (x 1 day) and Dizziness (In the morning when she wakes up )  Patient here with complaints of extreme fatigue and dizziness. The dizziness occurs every time she stands up and lasts for ~10 seconds. The fatigue is all day; she reports she could just lay on the couch and not move which is unusual for her. Denies chest pain or shortness of breath. She was recently hospitalized for symptomatic anemia ~3 weeks ago. She has a follow-up appointment scheduled with hematology and cardiology.    ROS: Negative unless specifically indicated above in HPI.   Relevant past medical history reviewed and updated as indicated.   Allergies and medications reviewed and updated.   Current Outpatient Medications:  .  albuterol (VENTOLIN HFA) 108 (90 Base) MCG/ACT inhaler, TAKE 2 PUFFS BY MOUTH EVERY 6 HOURS AS NEEDED FOR WHEEZE OR SHORTNESS OF BREATH, Disp: 18 g, Rfl: 0 .  bisoprolol (ZEBETA) 5 MG tablet, Take 0.5 tablets (2.5 mg total) by mouth daily., Disp: 45 tablet, Rfl: 3 .  cyanocobalamin ([employer]S VITAMIN B12) 2000 MCG tablet, Take 1 tablet (2,000 mcg total) by mouth daily., Disp:  , Rfl:  .  lisinopril (ZESTRIL) 5 MG  tablet, Take 1 tablet (5 mg total) by mouth daily., Disp: 90 tablet, Rfl: 3 .  Magnesium 200 MG TABS, Take 1 tablet (200 mg total) by mouth daily., Disp: 30 tablet, Rfl: 6 .  Multiple Vitamin (MULTIVITAMIN) capsule, Take 1 capsule by mouth daily., Disp: , Rfl:  .  Rosuvastatin Calcium 10 MG CPSP, Take 10 mg by mouth at bedtime., Disp: 90 capsule, Rfl: 3 .  thiamine (VITAMIN B-1) 100 MG tablet, Take 100 mg by mouth daily., Disp: , Rfl:  .  vitamin E 400 UNIT capsule, Take 400 Units by mouth daily. , Disp: , Rfl:   Allergies  Allergen Reactions  . Sulfur Rash    Objective:   BP 130/66   Pulse 61   Temp 98 F (36.7 C) (Temporal)   Ht 5\' 6"  (1.676 m)   Wt 189 lb 3.2 oz (85.8 kg)   SpO2 97%   BMI 30.54 kg/m    Physical Exam Vitals reviewed.  Constitutional:      General: She is not in acute distress.    Appearance: Normal appearance. She is obese. She is not ill-appearing, toxic-appearing or diaphoretic.  HENT:     Head: Normocephalic and atraumatic.     Right Ear: Tympanic membrane, ear canal and external ear normal. There is no impacted cerumen.     Left Ear: Tympanic membrane, ear canal and external ear normal. There is no impacted cerumen.  Eyes:     General: No scleral icterus.  Right eye: No discharge.        Left eye: No discharge.     Conjunctiva/sclera: Conjunctivae normal.  Cardiovascular:     Rate and Rhythm: Normal rate and regular rhythm.     Heart sounds: Normal heart sounds. No murmur. No friction rub. No gallop.   Pulmonary:     Effort: Pulmonary effort is normal. No respiratory distress.     Breath sounds: Normal breath sounds. No stridor. No wheezing, rhonchi or rales.  Musculoskeletal:        General: Normal range of motion.     Cervical back: Normal range of motion.  Skin:    General: Skin is warm and dry.     Capillary Refill: Capillary refill takes less than 2 seconds.  Neurological:     General: No focal deficit present.     Mental Status:  She is alert and oriented to person, place, and time. Mental status is at baseline.  Psychiatric:        Mood and Affect: Mood normal.        Behavior: Behavior normal.        Thought Content: Thought content normal.        Judgment: Judgment normal.

## 2019-04-19 ENCOUNTER — Inpatient Hospital Stay (HOSPITAL_COMMUNITY): Payer: BC Managed Care – PPO | Attending: Hematology | Admitting: Hematology

## 2019-04-19 ENCOUNTER — Inpatient Hospital Stay (HOSPITAL_COMMUNITY): Payer: BC Managed Care – PPO

## 2019-04-19 ENCOUNTER — Encounter (HOSPITAL_COMMUNITY): Payer: Self-pay | Admitting: Hematology

## 2019-04-19 ENCOUNTER — Other Ambulatory Visit: Payer: Self-pay

## 2019-04-19 VITALS — BP 126/61 | HR 82 | Temp 97.5°F | Resp 18 | Ht 66.0 in | Wt 189.9 lb

## 2019-04-19 DIAGNOSIS — E538 Deficiency of other specified B group vitamins: Secondary | ICD-10-CM | POA: Insufficient documentation

## 2019-04-19 DIAGNOSIS — D649 Anemia, unspecified: Secondary | ICD-10-CM | POA: Diagnosis not present

## 2019-04-19 DIAGNOSIS — R634 Abnormal weight loss: Secondary | ICD-10-CM | POA: Insufficient documentation

## 2019-04-19 DIAGNOSIS — D509 Iron deficiency anemia, unspecified: Secondary | ICD-10-CM | POA: Insufficient documentation

## 2019-04-19 DIAGNOSIS — R61 Generalized hyperhidrosis: Secondary | ICD-10-CM | POA: Insufficient documentation

## 2019-04-19 LAB — CBC WITH DIFFERENTIAL/PLATELET
Abs Immature Granulocytes: 0.01 10*3/uL (ref 0.00–0.07)
Basophils Absolute: 0.1 10*3/uL (ref 0.0–0.1)
Basophils Relative: 1 %
Eosinophils Absolute: 0.2 10*3/uL (ref 0.0–0.5)
Eosinophils Relative: 3 %
HCT: 36.8 % (ref 36.0–46.0)
Hemoglobin: 11.2 g/dL — ABNORMAL LOW (ref 12.0–15.0)
Immature Granulocytes: 0 %
Lymphocytes Relative: 40 %
Lymphs Abs: 2.6 10*3/uL (ref 0.7–4.0)
MCH: 25.6 pg — ABNORMAL LOW (ref 26.0–34.0)
MCHC: 30.4 g/dL (ref 30.0–36.0)
MCV: 84 fL (ref 80.0–100.0)
Monocytes Absolute: 0.6 10*3/uL (ref 0.1–1.0)
Monocytes Relative: 9 %
Neutro Abs: 3 10*3/uL (ref 1.7–7.7)
Neutrophils Relative %: 47 %
Platelets: 243 10*3/uL (ref 150–400)
RBC: 4.38 MIL/uL (ref 3.87–5.11)
RDW: 22.7 % — ABNORMAL HIGH (ref 11.5–15.5)
WBC: 6.4 10*3/uL (ref 4.0–10.5)
nRBC: 0 % (ref 0.0–0.2)

## 2019-04-19 LAB — RETICULOCYTES
Immature Retic Fract: 7.2 % (ref 2.3–15.9)
RBC.: 4.26 MIL/uL (ref 3.87–5.11)
Retic Count, Absolute: 33.2 10*3/uL (ref 19.0–186.0)
Retic Ct Pct: 0.8 % (ref 0.4–3.1)

## 2019-04-19 LAB — COMPREHENSIVE METABOLIC PANEL
ALT: 22 U/L (ref 0–44)
AST: 31 U/L (ref 15–41)
Albumin: 4 g/dL (ref 3.5–5.0)
Alkaline Phosphatase: 38 U/L (ref 38–126)
Anion gap: 8 (ref 5–15)
BUN: 13 mg/dL (ref 8–23)
CO2: 28 mmol/L (ref 22–32)
Calcium: 9 mg/dL (ref 8.9–10.3)
Chloride: 97 mmol/L — ABNORMAL LOW (ref 98–111)
Creatinine, Ser: 0.76 mg/dL (ref 0.44–1.00)
GFR calc Af Amer: >60 mL/min (ref >60)
GFR calc non Af Amer: >60 mL/min (ref >60)
Glucose, Bld: 106 mg/dL — ABNORMAL HIGH (ref 70–99)
Potassium: 3.6 mmol/L (ref 3.5–5.1)
Sodium: 133 mmol/L — ABNORMAL LOW (ref 135–145)
Total Bilirubin: 0.8 mg/dL (ref 0.3–1.2)
Total Protein: 6.5 g/dL (ref 6.5–8.1)

## 2019-04-19 LAB — FOLATE: Folate: 8.5 ng/mL (ref >5.9)

## 2019-04-19 LAB — VITAMIN B12: Vitamin B-12: 461 pg/mL (ref 180–914)

## 2019-04-19 LAB — FERRITIN: Ferritin: 32 ng/mL (ref 11–307)

## 2019-04-19 LAB — LACTATE DEHYDROGENASE: LDH: 192 U/L (ref 98–192)

## 2019-04-19 LAB — IRON AND TIBC
Iron: 139 ug/dL (ref 28–170)
Saturation Ratios: 32 % — ABNORMAL HIGH (ref 10.4–31.8)
TIBC: 436 ug/dL (ref 250–450)
UIBC: 297 ug/dL

## 2019-04-19 NOTE — Patient Instructions (Signed)
[place] at [facility] Discharge Instructions  You were seen today by Dr. Delton Coombes. He went over your history,family history and how you've been feeling since you recent hospital stay. He will have blood drawn today before you leave the hospital. He will see you back in 2 weeks for labs and follow up.   Thank you for choosing [place] at [facility] to provide your oncology and hematology care.  To afford each patient quality time with our provider, please arrive at least 15 minutes before your scheduled appointment time.   If you have a lab appointment with the [facility] please come in thru the  Main Entrance and check in at the main information desk  You need to re-schedule your appointment should you arrive 10 or more minutes late.  We strive to give you quality time with our providers, and arriving late affects you and other patients whose appointments are after yours.  Also, if you no show three or more times for appointments you may be dismissed from the clinic at the providers discretion.     Again, thank you for choosing [facility].  Our hope is that these requests will decrease the amount of time that you wait before being seen by our physicians.       _____________________________________________________________  Should you have questions after your visit to [facility], please contact our office at [phone] between the hours of 8:00 a.m. and 4:30 p.m.  Voicemails left after 4:00 p.m. will not be returned until the following business day.  For prescription refill requests, have your pharmacy contact our office and allow 72 hours.    Cancer Center Support Programs:   > Cancer Support Group  2nd Tuesday of the month 1pm-2pm, Journey Room

## 2019-04-19 NOTE — Progress Notes (Signed)
CONSULT NOTE  Patient Care Team: [name], DO as PCP - General (Family Medicine) Derek Jack, MD as Consulting Physician (Hematology)  CHIEF COMPLAINTS/PURPOSE OF CONSULTATION:  Severe anemia.  HISTORY OF PRESENTING ILLNESS:  Stacey Reyes 74 y.o. female is seen in consultation today at the request of hospitalist service for follow-up and further management of severe iron deficiency and B12 deficiency anemia.  She was sent to the ER on 03/24/2019 when her hemoglobin was severely low at 5.3.  B12 was also low at 145 and ferritin at 6.  She has received 3 units of PRBC while in the hospital.  She also received 1 infusion of Feraheme prior to discharge.  She denies any bleeding per rectum or melena.  Shortness of breath on exertion has improved since transfusion.  However she continues to feel fatigued.  She retired on 02/04/2019 and worked 40 hours/week prior to that.  She did mainly office job.  She quit smoking more than 30 years ago.  She drinks occasional glass of wine.  No family history of anemias requiring blood transfusions.  Sister has metastatic breast cancer.  Mother has colon cancer.  She is reportedly taking vitamin B12 tablet daily.  She is not on any oral iron supplements.  Denies any chest pain on exertion or presyncopal episodes.  No hematuria or hematochezia.  She does not take any over-the-counter NSAIDs.  She denies any pica needs severity of diet.  She had EGD and colonoscopy on 11/17/2019 2 diminutive polyps in the transverse colon were removed.  She has extensive diverticulosis in the sigmoid colon, descending colon and transverse colon.  EGD showed duodenal erosions.  Appetite is 75%.  Energy levels are 50%.    MEDICAL HISTORY:  Past Medical History:  Diagnosis Date  . Anemia   . Cataract    removed bilateral  . Chronic cystitis   . Heart murmur   . Hyperlipidemia   . Hypertension   . Personal history of colonic polyps-adenomas 01/07/2012    2009 - 2 diminutive adenomas (prior polyps also) 01/07/2012 - 2 diminutive adenomas      SURGICAL HISTORY: Past Surgical History:  Procedure Laterality Date  . BREAST BIOPSY Right    No Scar seen   . BREAST SURGERY     breast biopsy/ right /benign  . COLONOSCOPY  multiple    SOCIAL HISTORY: Social History   Socioeconomic History  . Marital status: Married    Spouse name: Not on file  . Number of children: 1  . Years of education: Not on file  . Highest education level: Not on file  Occupational History  . Occupation: retired  Tobacco Use  . Smoking status: Former Smoker    Quit date: 12/23/1984    Years since quitting: 34.3  . Smokeless tobacco: Never Used  Substance and Sexual Activity  . Alcohol use: No  . Drug use: No  . Sexual activity: Not on file  Other Topics Concern  . Not on file  Social History Narrative  . Not on file   Social Determinants of Health   Financial Resource Strain:   . Difficulty of Paying Living Expenses: Not on file  Food Insecurity:   . Worried About Charity fundraiser in the Last Year: Not on file  . Ran Out of Food in the Last Year: Not on file  Transportation Needs:   . Lack of Transportation (Medical): Not on file  . Lack of Transportation (Non-Medical): Not on file  Physical Activity:   . Days of Exercise per Week: Not on file  . Minutes of Exercise per Session: Not on file  Stress:   . Feeling of Stress : Not on file  Social Connections:   . Frequency of Communication with Friends and Family: Not on file  . Frequency of Social Gatherings with Friends and Family: Not on file  . Attends Religious Services: Not on file  . Active Member of Clubs or Organizations: Not on file  . Attends Archivist Meetings: Not on file  . Marital Status: Not on file  Intimate Partner Violence:   . Fear of Current or Ex-Partner: Not on file  . Emotionally Abused: Not on file  . Physically Abused: Not on file  . Sexually Abused: Not  on file    FAMILY HISTORY: Family History  Problem Relation Age of Onset  . Colon cancer Mother 52       80's  . Breast cancer Sister   . Asthma Brother   . Colon polyps Neg Hx   . Esophageal cancer Neg Hx   . Rectal cancer Neg Hx   . Stomach cancer Neg Hx     ALLERGIES:  is allergic to sulfur.  MEDICATIONS:  Current Outpatient Medications  Medication Sig Dispense Refill  . bisoprolol (ZEBETA) 5 MG tablet Take 0.5 tablets (2.5 mg total) by mouth daily. 45 tablet 3  . cyanocobalamin ([facility] VITAMIN B12) 2000 MCG tablet Take 1 tablet (2,000 mcg total) by mouth daily.    Marland Kitchen lisinopril (ZESTRIL) 5 MG tablet Take 1 tablet (5 mg total) by mouth daily. 90 tablet 3  . Magnesium 200 MG TABS Take 1 tablet (200 mg total) by mouth daily. 30 tablet 6  . Multiple Vitamin (MULTIVITAMIN) capsule Take 1 capsule by mouth daily.    . Rosuvastatin Calcium 10 MG CPSP Take 10 mg by mouth at bedtime. 90 capsule 3  . thiamine (VITAMIN B-1) 100 MG tablet Take 100 mg by mouth daily.    . vitamin E 400 UNIT capsule Take 400 Units by mouth daily.     Marland Kitchen albuterol (VENTOLIN HFA) 108 (90 Base) MCG/ACT inhaler TAKE 2 PUFFS BY MOUTH EVERY 6 HOURS AS NEEDED FOR WHEEZE OR SHORTNESS OF BREATH (Patient not taking: Reported on 04/19/2019) 18 g 0   No current facility-administered medications for this visit.    REVIEW OF SYSTEMS:   Constitutional: Denies fevers, chills or abnormal night sweats.  Positive for dizziness in the morning. Eyes: Denies blurriness of vision, double vision or watery eyes Ears, nose, mouth, throat, and face: Denies mucositis or sore throat Respiratory: Denies cough, dyspnea or wheezes Cardiovascular: Denies palpitation, chest discomfort or lower extremity swelling Gastrointestinal:  Denies nausea, heartburn or change in bowel habits Skin: Denies abnormal skin rashes Lymphatics: Denies new lymphadenopathy or easy bruising Neurological:Denies numbness, tingling or new  weaknesses Behavioral/Psych: Mood is stable, no new changes  All other systems were reviewed with the patient and are negative.  PHYSICAL EXAMINATION: ECOG PERFORMANCE STATUS: 1 - Symptomatic but completely ambulatory  Vitals:   04/19/19 1259  BP: 126/61  Pulse: 82  Resp: 18  Temp: (!) 97.5 F (36.4 C)  SpO2: 95%   Filed Weights   04/19/19 1259  Weight: 189 lb 14.4 oz (86.1 kg)    GENERAL:alert, no distress and comfortable SKIN: skin color, texture, turgor are normal, no rashes or significant lesions EYES: normal, conjunctiva are pink and non-injected, sclera clear OROPHARYNX:no exudate, no erythema and lips,  buccal mucosa, and tongue normal  NECK: supple, thyroid normal size, non-tender, without nodularity LYMPH:  no palpable lymphadenopathy in the cervical, axillary or inguinal LUNGS: clear to auscultation and percussion with normal breathing effort HEART: regular rate & rhythm and no murmurs and no lower extremity edema ABDOMEN:abdomen soft, non-tender and normal bowel sounds Musculoskeletal:no cyanosis of digits and no clubbing  PSYCH: alert & oriented x 3 with fluent speech NEURO: no focal motor/sensory deficits  LABORATORY DATA:  I have reviewed the data as listed Recent Results (from the past 2160 hour(s))  Novel Coronavirus, NAA ([facility])     Status: None   Collection Time: 03/09/19  2:06 PM   Specimen: Nasopharyngeal(NP) swabs in vial transport medium   NASOPHARYNGE  TESTING  Result Value Ref Range   SARS-CoV-2, NAA Not Detected Not Detected    Comment: This nucleic acid amplification test was developed and its performance characteristics determined by [employer]. Nucleic acid amplification tests include PCR and TMA. This test has not been FDA cleared or approved. This test has been authorized by FDA under an Emergency Use Authorization (EUA). This test is only authorized for the duration of time the declaration that circumstances exist justifying  the authorization of the emergency use of in vitro diagnostic tests for detection of SARS-CoV-2 virus and/or diagnosis of COVID-19 infection under section 564(b)(1) of the Act, 21 U.S.C. [id number]) (1), unless the authorization is terminated or revoked sooner. When diagnostic testing is negative, the possibility of a false negative result should be considered in the context of a patient's recent exposures and the presence of clinical signs and symptoms consistent with COVID-19. An individual without symptoms of COVID-19 and who is not shedding SARS-CoV-2 virus would  expect to have a negative (not detected) result in this assay.   I-STAT creatinine     Status: None   Collection Time: 03/15/19  3:21 PM  Result Value Ref Range   Creatinine, Ser 0.70 0.44 - 1.00 mg/dL  Basic Metabolic Panel (BMET)     Status: Abnormal   Collection Time: 03/23/19  4:38 PM  Result Value Ref Range   Glucose 102 (H) 65 - 99 mg/dL   BUN 13 8 - 27 mg/dL   Creatinine, Ser 0.74 0.57 - 1.00 mg/dL   GFR calc non Af Amer 80 >59 mL/min/1.73   GFR calc Af Amer 92 >59 mL/min/1.73   BUN/Creatinine Ratio 18 12 - 28   Sodium 137 134 - 144 mmol/L   Potassium 4.5 3.5 - 5.2 mmol/L   Chloride 102 96 - 106 mmol/L   CO2 23 20 - 29 mmol/L   Calcium 9.5 8.7 - 10.3 mg/dL  CBC     Status: Abnormal   Collection Time: 03/23/19  4:38 PM  Result Value Ref Range   WBC 9.9 3.4 - 10.8 x10E3/uL   RBC 2.61 (LL) 3.77 - 5.28 x10E6/uL   Hemoglobin 5.6 (LL) 11.1 - 15.9 g/dL    Comment:                   Client Requested Flag **CBC Results Repeated**    Hematocrit 19.2 (L) 34.0 - 46.6 %   MCV 74 (L) 79 - 97 fL   MCH 21.5 (L) 26.6 - 33.0 pg   MCHC 29.2 (L) 31.5 - 35.7 g/dL   RDW 20.4 (H) 11.7 - 15.4 %   Platelets 307 150 - 450 x10E3/uL  Magnesium     Status: None   Collection Time: 03/23/19  4:38 PM  Result Value Ref Range   Magnesium 2.1 1.6 - 2.3 mg/dL  TSH     Status: None   Collection Time: 03/23/19  4:38 PM  Result Value  Ref Range   TSH 1.020 0.450 - 4.500 uIU/mL  Vitamin D (25 hydroxy)     Status: Abnormal   Collection Time: 03/23/19  4:38 PM  Result Value Ref Range   Vit D, 25-Hydroxy 26.7 (L) 30.0 - 100.0 ng/mL    Comment: Vitamin D deficiency has been defined by the [place] practice guideline as a level of serum 25-OH vitamin D less than 20 ng/mL (1,2). The Endocrine Society went on to further define vitamin D insufficiency as a level between 21 and 29 ng/mL (2). 1. IOM (Institute of Medicine). 2010. Dietary reference    intakes for calcium and D. Love: The    [employer]. 2. Holick MF, [place], Bischoff-Ferrari HA, et al.    Evaluation, treatment, and prevention of vitamin D    deficiency: an Endocrine Society clinical practice    guideline. JCEM. 2011 Jul; 96(7):1911-30.   CBC with Differential     Status: Abnormal   Collection Time: 03/24/19  3:31 AM  Result Value Ref Range   WBC 8.3 4.0 - 10.5 K/uL   RBC 2.49 (L) 3.87 - 5.11 MIL/uL   Hemoglobin 5.3 (LL) 12.0 - 15.0 g/dL    Comment: Reticulocyte Hemoglobin testing may be clinically indicated, consider ordering this additional test [id number] THIS CRITICAL RESULT HAS VERIFIED AND BEEN CALLED TO TURNER,C. BY ED AGUNDIZ ON 12 30 2020 AT 0359, AND HAS BEEN READ BACK.     HCT 18.9 (L) 36.0 - 46.0 %   MCV 75.9 (L) 80.0 - 100.0 fL   MCH 21.3 (L) 26.0 - 34.0 pg   MCHC 28.0 (L) 30.0 - 36.0 g/dL   RDW 22.1 (H) 11.5 - 15.5 %   Platelets 264 150 - 400 K/uL   nRBC 0.0 0.0 - 0.2 %   Neutrophils Relative % 45 %   Neutro Abs 3.8 1.7 - 7.7 K/uL   Lymphocytes Relative 36 %   Lymphs Abs 3.0 0.7 - 4.0 K/uL   Monocytes Relative 12 %   Monocytes Absolute 1.0 0.1 - 1.0 K/uL   Eosinophils Relative 6 %   Eosinophils Absolute 0.5 0.0 - 0.5 K/uL   Basophils Relative 1 %   Basophils Absolute 0.1 0.0 - 0.1 K/uL   Immature Granulocytes 0 %   Abs Immature Granulocytes 0.02 0.00 - 0.07 K/uL     Comment: Performed at [facility], [address] [place], [place] [address]  Comprehensive metabolic panel     Status: Abnormal   Collection Time: 03/24/19  3:31 AM  Result Value Ref Range   Sodium 133 (L) 135 - 145 mmol/L   Potassium 3.6 3.5 - 5.1 mmol/L   Chloride 104 98 - 111 mmol/L   CO2 22 22 - 32 mmol/L   Glucose, Bld 113 (H) 70 - 99 mg/dL   BUN 14 8 - 23 mg/dL   Creatinine, Ser 0.65 0.44 - 1.00 mg/dL   Calcium 8.4 (L) 8.9 - 10.3 mg/dL   Total Protein 5.9 (L) 6.5 - 8.1 g/dL   Albumin 3.6 3.5 - 5.0 g/dL   AST 15 15 - 41 U/L   ALT 14 0 - 44 U/L   Alkaline Phosphatase 30 (L) 38 - 126 U/L   Total Bilirubin 0.7 0.3 - 1.2 mg/dL  GFR calc non Af Amer >60 >60 mL/min   GFR calc Af Amer >60 >60 mL/min   Anion gap 7 5 - 15    Comment: Performed at [facility], [address], [place], [place] [address]  Type and screen [facility]     Status: None   Collection Time: 03/24/19  3:31 AM  Result Value Ref Range   ABO/RH(D) O POS    Antibody Screen NEG    Sample Expiration 03/27/2019,2359    Unit Number [id number]    Blood Component Type RED CELLS,LR    Unit division 00    Status of Unit ISSUED,FINAL    Transfusion Status OK TO TRANSFUSE    Crossmatch Result Compatible    Unit Number [id number]    Blood Component Type RED CELLS,LR    Unit division 00    Status of Unit ISSUED,FINAL    Transfusion Status OK TO TRANSFUSE    Crossmatch Result Compatible    Unit Number [id number]    Blood Component Type RED CELLS,LR    Unit division 00    Status of Unit ISSUED,FINAL    Transfusion Status OK TO TRANSFUSE    Crossmatch Result      Compatible Performed at [facility], [address], [place], [place] [address]   Vitamin B12     Status: Abnormal   Collection Time: 03/24/19  3:31 AM  Result Value Ref Range   Vitamin B-12 145 (L) 180 - 914 pg/mL    Comment: (NOTE) This assay is not validated for testing neonatal or myeloproliferative syndrome specimens for  Vitamin B12 levels. Performed at [facility], [address], [place], [place] [address]   Folate     Status: None   Collection Time: 03/24/19  3:31 AM  Result Value Ref Range   Folate 9.4 >5.9 ng/mL    Comment: Performed at [facility], [address] [place], [place] [address]  Iron and TIBC     Status: Abnormal   Collection Time: 03/24/19  3:31 AM  Result Value Ref Range   Iron 10 (L) 28 - 170 ug/dL   TIBC 490 (H) 250 - 450 ug/dL   Saturation Ratios 2 (L) 10.4 - 31.8 %   UIBC 480 ug/dL    Comment: Performed at [facility], [address], [place], [place] [address]  Ferritin     Status: Abnormal   Collection Time: 03/24/19  3:31 AM  Result Value Ref Range   Ferritin 6 (L) 11 - 307 ng/mL    Comment: Performed at [facility], [address] [place], [place] [address]  Reticulocytes     Status: Abnormal   Collection Time: 03/24/19  3:31 AM  Result Value Ref Range   Retic Ct Pct 2.1 0.4 - 3.1 %   RBC. 2.47 (L) 3.87 - 5.11 MIL/uL   Retic Count, Absolute 52.1 19.0 - 186.0 K/uL   Immature Retic Fract 8.3 2.3 - 15.9 %    Comment: Performed at [facility], [address] [place], [place] [address]  ABO/Rh     Status: None   Collection Time: 03/24/19  3:31 AM  Result Value Ref Range   ABO/RH(D)      O POS Performed at [facility], [address], [place],  [address]   BPAM RBC     Status: None   Collection Time: 03/24/19  3:31 AM  Result Value Ref Range   ISSUE DATE / TIME [id number]    Blood Product Unit  Number [id number]    PRODUCT CODE [id number]    Unit Type and Rh 5100    Blood Product Expiration Date 202102022359    ISSUE DATE / TIME [id number]    Blood Product Unit Number [id number]    PRODUCT CODE [id number]    Unit Type and Rh 5100    Blood Product Expiration Date 202102022359    ISSUE DATE / TIME [id number]    Blood Product Unit Number [id number]    PRODUCT CODE [id number]    Unit Type and Rh 5100    Blood Product Expiration Date [id number]    Prepare RBC     Status: None   Collection Time: 03/24/19  3:31 AM  Result Value Ref Range   Order Confirmation      ORDER PROCESSED BY BLOOD BANK Performed at [facility], [address] [place], [place] [address]   Prepare RBC     Status: None   Collection Time: 03/24/19  4:07 AM  Result Value Ref Range   Order Confirmation      ORDER PROCESSED BY BLOOD BANK Performed at [facility], [address], [place], [place] [address]   SARS CORONAVIRUS 2 (TAT 6-24 HRS) Nasopharyngeal Nasopharyngeal Swab     Status: None   Collection Time: 03/24/19  8:08 AM   Specimen: Nasopharyngeal Swab  Result Value Ref Range   SARS Coronavirus 2 NEGATIVE NEGATIVE    Comment: (NOTE) SARS-CoV-2 target nucleic acids are NOT DETECTED. The SARS-CoV-2 RNA is generally detectable in upper and lower respiratory specimens during the acute phase of infection. Negative results do not preclude SARS-CoV-2 infection, do not rule out co-infections with other pathogens, and should not be used as the sole basis for treatment or other patient management decisions. Negative results must be combined with clinical observations, patient history, and epidemiological information. The expected result is Negative. Fact Sheet for Patients: SugarRoll.be Fact Sheet for Healthcare Providers: https://www.woods-mathews.com/ This test is not yet approved or cleared by the [place] FDA and  has been authorized for detection and/or diagnosis of SARS-CoV-2 by FDA under an Emergency Use Authorization (EUA). This EUA will remain  in effect (meaning this test can be used) for the duration of the COVID-19 declaration under Section 56 4(b)(1) of the Act, 21 U.S.C. section 360bbb-3(b)(1), unless the authorization is terminated or revoked sooner. Performed at [facility], [place] [address], [place], [place] [address]   Brain natriuretic peptide     Status: Abnormal   Collection Time:  03/24/19  3:55 PM  Result Value Ref Range   B Natriuretic Peptide 462.0 (H) 0.0 - 100.0 pg/mL    Comment: Performed at [facility], [address] [place], [place] [address]  Troponin I (High Sensitivity)     Status: None   Collection Time: 03/24/19  3:55 PM  Result Value Ref Range   Troponin I (High Sensitivity) 10 <18 ng/L    Comment: (NOTE) Elevated high sensitivity troponin I (hsTnI) values and significant  changes across serial measurements may suggest ACS but many other  chronic and acute conditions are known to elevate hsTnI results.  Refer to the "Links" section for chest pain algorithms and additional  guidance. Performed at [facility], [address] [place], [place] [address]   ECHOCARDIOGRAM COMPLETE     Status: None   Collection Time: 03/24/19  4:54 PM  Result Value Ref Range   Weight 3,056 oz   Height 66 in   BP 149/71 mmHg  Troponin I (High  Sensitivity)     Status: None   Collection Time: 03/24/19  6:14 PM  Result Value Ref Range   Troponin I (High Sensitivity) 9 <18 ng/L    Comment: (NOTE) Elevated high sensitivity troponin I (hsTnI) values and significant  changes across serial measurements may suggest ACS but many other  chronic and acute conditions are known to elevate hsTnI results.  Refer to the "Links" section for chest pain algorithms and additional  guidance. Performed at [facility], [address] [place], [place] [id number]   Basic metabolic panel     Status: Abnormal   Collection Time: 03/25/19  5:49 AM  Result Value Ref Range   Sodium 135 135 - 145 mmol/L   Potassium 3.8 3.5 - 5.1 mmol/L   Chloride 101 98 - 111 mmol/L   CO2 24 22 - 32 mmol/L   Glucose, Bld 94 70 - 99 mg/dL   BUN 8 8 - 23 mg/dL   Creatinine, Ser 0.62 0.44 - 1.00 mg/dL   Calcium 8.8 (L) 8.9 - 10.3 mg/dL   GFR calc non Af Amer >60 >60 mL/min   GFR calc Af Amer >60 >60 mL/min   Anion gap 10 5 - 15    Comment: Performed at [facility] [address], [place], [place]  [address]  CBC     Status: Abnormal   Collection Time: 03/25/19  5:49 AM  Result Value Ref Range   WBC 6.6 4.0 - 10.5 K/uL   RBC 3.02 (L) 3.87 - 5.11 MIL/uL   Hemoglobin 6.9 (LL) 12.0 - 15.0 g/dL    Comment: Reticulocyte Hemoglobin testing may be clinically indicated, consider ordering this additional test [id number] THIS CRITICAL RESULT HAS VERIFIED AND BEEN CALLED TO HARRIS,B BY BOBBIE MATTHEWS ON 12 31 2020 AT 0643, AND HAS BEEN READ BACK.  THIS CRITICAL RESULT HAS VERIFIED AND BEEN CALLED TO HARRIS,B BY BOBBIE MATTHEWS ON 12 31 2020 AT [id number], AND HAS BEEN READ BACK.     HCT 23.4 (L) 36.0 - 46.0 %   MCV 77.5 (L) 80.0 - 100.0 fL   MCH 22.8 (L) 26.0 - 34.0 pg   MCHC 29.5 (L) 30.0 - 36.0 g/dL   RDW 20.6 (H) 11.5 - 15.5 %   Platelets 237 150 - 400 K/uL   nRBC 0.0 0.0 - 0.2 %    Comment: Performed at [facility], [address], [place], [place] [address]  CBC     Status: Abnormal   Collection Time: 03/31/19 10:44 AM  Result Value Ref Range   WBC 6.4 3.4 - 10.8 x10E3/uL   RBC 4.00 3.77 - 5.28 x10E6/uL   Hemoglobin 10.0 (L) 11.1 - 15.9 g/dL   Hematocrit 32.9 (L) 34.0 - 46.6 %   MCV 82 79 - 97 fL   MCH 25.0 (L) 26.6 - 33.0 pg   MCHC 30.4 (L) 31.5 - 35.7 g/dL   RDW 21.8 (H) 11.7 - 15.4 %   Platelets 214 150 - 450 x10E3/uL  Hemoglobin, fingerstick     Status: None   Collection Time: 04/16/19  8:38 AM  Result Value Ref Range   Hemoglobin 11.5 11.1 - 15.9 g/dL    RADIOGRAPHIC STUDIES: I have personally reviewed the radiological images as listed and agreed with the findings in the report. ECHOCARDIOGRAM COMPLETE  Result Date: 03/24/2019   ECHOCARDIOGRAM REPORT   Patient Name:   Stacey Reyes Date of Exam: 03/24/2019 Medical Rec #:  [id number]  Height:       66.0 in Accession #:    [id number]        Weight:       191.0 lb Date of Birth:  12/02/1945         BSA:          1.96 m Patient Age:    64 years          BP:           149/71 mmHg Patient Gender: F                 HR:            76 bpm. Exam Location:  [facility] Procedure: 2D Echo, Cardiac Doppler and Color Doppler Indications:    Dyspnea 786.09 / R06.00  History:        Patient has prior history of Echocardiogram examinations, most                 recent 10/20/2017. Risk Factors:Hypertension and Dyslipidemia.                 LVH (left ventricular hypertrophy).  Sonographer:    Alvino Chapel RCS Referring Phys: [phone] DAVID TAT IMPRESSIONS  1. Left ventricular ejection fraction, by visual estimation, is >75%. The left ventricle has hyperdynamic function. There is severely increased left ventricular hypertrophy.  2. Elevated left atrial pressure.  3. Left ventricular diastolic parameters are consistent with Grade I diastolic dysfunction (impaired relaxation).  4. The left ventricle has no regional wall motion abnormalities.  5. There is a dynamic LVOT gradient in the setting of severe symmetricla hypertrophy and hyperdynamic LV function leading to [facility] of the anterior mitral valve leaflet. Difficult to discern the peak graident, approximately appears to be 20 mmHg with Valsalva.  6. Global right ventricle has normal systolic function.The right ventricular size is normal. No increase in right ventricular wall thickness.  7. Left atrial size was severely dilated.  8. Right atrial size was normal.  9. The mitral valve is normal in structure. Mild mitral valve regurgitation. No evidence of mitral stenosis. 10. The tricuspid valve is normal in structure. 11. The aortic valve is tricuspid. Aortic valve regurgitation is not visualized. Mild aortic valve stenosis. 12. The pulmonic valve was not well visualized. Pulmonic valve regurgitation is not visualized. 13. The inferior vena cava is normal in size with greater than 50% respiratory variability, suggesting right atrial pressure of 3 mmHg. FINDINGS  Left Ventricle: Left ventricular ejection fraction, by visual estimation, is >75%. The left ventricle has hyperdynamic function. The left ventricle  has no regional wall motion abnormalities. There is severely increased left ventricular hypertrophy. Left  ventricular diastolic parameters are consistent with Grade I diastolic dysfunction (impaired relaxation). Elevated left atrial pressure. There is a dynamic LVOT gradient in the setting of severe symmetricla hypertrophy and hyperdynamic LV function leading to [facility] of the anterior mitral valve leaflet. Difficult to discern the peak graident, approximately appears to be 20 mmHg with Valsalva. Right Ventricle: The right ventricular size is normal. No increase in right ventricular wall thickness. Global RV systolic function is has normal systolic function. Left Atrium: Left atrial size was severely dilated. Right Atrium: Right atrial size was normal in size Pericardium: There is no evidence of pericardial effusion. Mitral Valve: The mitral valve is normal in structure. Mild mitral valve regurgitation. No evidence of mitral valve stenosis by observation. MV peak gradient, 18.8 mmHg. Tricuspid Valve: The tricuspid valve is normal  in structure. Tricuspid valve regurgitation is not demonstrated. Aortic Valve: The aortic valve is tricuspid. . There is mild thickening and mild calcification of the aortic valve. Aortic valve regurgitation is not visualized. Mild aortic stenosis is present. Mild aortic valve annular calcification. There is mild thickening of the aortic valve. There is mild calcification of the aortic valve. Pulmonic Valve: The pulmonic valve was not well visualized. Pulmonic valve regurgitation is not visualized. Pulmonic regurgitation is not visualized. No evidence of pulmonic stenosis. Aorta: The aortic root is normal in size and structure. Pulmonary Artery: Indeterminant PASP, inadequate TR jet. Venous: The inferior vena cava is normal in size with greater than 50% respiratory variability, suggesting right atrial pressure of 3 mmHg. IAS/Shunts: No atrial level shunt detected by color flow Doppler.  LEFT  VENTRICLE PLAX 2D LVIDd:         4.36 cm       Diastology LVIDs:         1.95 cm       LV e' lateral:   5.87 cm/s LV PW:         2.11 cm       LV E/e' lateral: 18.4 LV IVS:        1.70 cm       LV e' medial:    6.53 cm/s LVOT diam:     2.00 cm       LV E/e' medial:  16.5 LV SV:         74 ml LV SV Index:   36.29 LVOT Area:     3.14 cm  LV Volumes (MOD) LV area d, A2C:    36.20 cm LV area d, A4C:    31.85 cm LV area s, A2C:    21.00 cm LV area s, A4C:    16.70 cm LV major d, A2C:   9.00 cm LV major d, A4C:   8.92 cm LV major s, A2C:   8.10 cm LV major s, A4C:   7.65 cm LV vol d, MOD A2C: 123.0 ml LV vol d, MOD A4C: 92.8 ml LV vol s, MOD A2C: 47.7 ml LV vol s, MOD A4C: 35.5 ml LV SV MOD A2C:     75.3 ml LV SV MOD A4C:     92.8 ml LV SV MOD BP:      63.9 ml RIGHT VENTRICLE RV S prime:     17.00 cm/s TAPSE (M-mode): 2.9 cm LEFT ATRIUM              Index       RIGHT ATRIUM           Index LA diam:        4.20 cm  2.14 cm/m  RA Area:     21.10 cm LA Vol (A2C):   134.0 ml 68.33 ml/m RA Volume:   64.30 ml  32.79 ml/m LA Vol (A4C):   79.4 ml  40.49 ml/m LA Biplane Vol: 109.0 ml 55.58 ml/m   AORTA Ao Root diam: 3.70 cm MITRAL VALVE MV Area (PHT): 2.20 cm              SHUNTS MV Peak grad:  18.8 mmHg             Systemic Diam: 2.00 cm MV Mean grad:  12.0 mmHg MV Vmax:       2.17 m/s MV Vmean:      165.0 cm/s MV VTI:        0.58 m MV  PHT:        100.05 msec MV Decel Time: 345 msec MV E velocity: 108.00 cm/s 103 cm/s MV A velocity: 129.00 cm/s 70.3 cm/s MV E/A ratio:  0.84        1.5  Carlyle Dolly MD Electronically signed by Carlyle Dolly MD Signature Date/Time: 03/24/2019/6:12:35 PM    Final     ASSESSMENT & PLAN:  Symptomatic anemia 1.  Severe microcytic anemia: -Patient admitted to the hospital on 03/24/2019 with hemoglobin of 5.3.  She was found to have a ferritin of 6 and a B12 of 145. -She received 3 units PRBC and 1 infusion of Feraheme and was discharged on 03/25/2019. -Colonoscopy on 11/17/2018  shows 2 diminutive polyps in the transverse colon, diverticulosis in the sigmoid colon, descending colon transverse colon. -EGD showed Duodenal erosions. -Latest hemoglobin on 04/16/2019 was 11.5. -She denied any bleeding per rectum or melena.  Reports severe fatigue although her shortness of breath improved after blood transfusion. -We will check a CBC, reticulocyte count, SPEP and LDH.  We will evaluate for various nutritional deficiencies.  We will also check her stool for occult blood.  We will see her back and 1 to 2 weeks.  2.  B12 deficiency: -B12 on 03/24/2019 was low at 145. -She is reportedly taking B12 tablets.  We will check methylmalonic acid and B12 levels today.  3.  Night sweats and weight loss: -She reported night sweats, similar to hot flashes since December.  She also reported 14 pound weight loss since November 2020. -She also reported some dizziness when she wakes up in the morning lasting few minutes and goes away.  This started last Thursday.  Her blood pressure medications dose was changed recently. -We will consider imaging of the abdomen and pelvis if LDH is found to be elevated or continues to lose weight.     All questions were answered. The patient knows to call the clinic with any problems, questions or concerns.      Derek Jack, MD 04/19/19 1:37 PM

## 2019-04-19 NOTE — Assessment & Plan Note (Signed)
1.  Severe microcytic anemia: -Patient admitted to the hospital on 03/24/2019 with hemoglobin of 5.3.  She was found to have a ferritin of 6 and a B12 of 145. -She received 3 units PRBC and 1 infusion of Feraheme and was discharged on 03/25/2019. -Colonoscopy on 11/17/2018 shows 2 diminutive polyps in the transverse colon, diverticulosis in the sigmoid colon, descending colon transverse colon. -EGD showed Duodenal erosions. -Latest hemoglobin on 04/16/2019 was 11.5. -She denied any bleeding per rectum or melena.  Reports severe fatigue although her shortness of breath improved after blood transfusion. -We will check a CBC, reticulocyte count, SPEP and LDH.  We will evaluate for various nutritional deficiencies.  We will also check her stool for occult blood.  We will see her back and 1 to 2 weeks.  2.  B12 deficiency: -B12 on 03/24/2019 was low at 145. -She is reportedly taking B12 tablets.  We will check methylmalonic acid and B12 levels today.  3.  Night sweats and weight loss: -She reported night sweats, similar to hot flashes since December.  She also reported 14 pound weight loss since November 2020. -She also reported some dizziness when she wakes up in the morning lasting few minutes and goes away.  This started last Thursday.  Her blood pressure medications dose was changed recently. -We will consider imaging of the abdomen and pelvis if LDH is found to be elevated or continues to lose weight.

## 2019-04-20 LAB — PROTEIN ELECTROPHORESIS, SERUM
A/G Ratio: 1.7 (ref 0.7–1.7)
Albumin ELP: 3.8 g/dL (ref 2.9–4.4)
Alpha-1-Globulin: 0.2 g/dL (ref 0.0–0.4)
Alpha-2-Globulin: 0.5 g/dL (ref 0.4–1.0)
Beta Globulin: 1 g/dL (ref 0.7–1.3)
Gamma Globulin: 0.6 g/dL (ref 0.4–1.8)
Globulin, Total: 2.3 g/dL (ref 2.2–3.9)
Total Protein ELP: 6.1 g/dL (ref 6.0–8.5)

## 2019-04-22 ENCOUNTER — Ambulatory Visit: Payer: Self-pay | Admitting: Cardiology

## 2019-04-22 ENCOUNTER — Other Ambulatory Visit (HOSPITAL_COMMUNITY): Payer: Self-pay | Admitting: *Deleted

## 2019-04-22 ENCOUNTER — Ambulatory Visit: Payer: BC Managed Care – PPO | Admitting: Adult Health

## 2019-04-22 DIAGNOSIS — R61 Generalized hyperhidrosis: Secondary | ICD-10-CM | POA: Diagnosis not present

## 2019-04-22 DIAGNOSIS — D509 Iron deficiency anemia, unspecified: Secondary | ICD-10-CM | POA: Diagnosis not present

## 2019-04-22 DIAGNOSIS — D649 Anemia, unspecified: Secondary | ICD-10-CM

## 2019-04-22 DIAGNOSIS — R634 Abnormal weight loss: Secondary | ICD-10-CM | POA: Diagnosis not present

## 2019-04-22 DIAGNOSIS — E538 Deficiency of other specified B group vitamins: Secondary | ICD-10-CM | POA: Diagnosis not present

## 2019-04-22 LAB — OCCULT BLOOD X 1 CARD TO LAB, STOOL
Fecal Occult Bld: NEGATIVE
Fecal Occult Bld: NEGATIVE
Fecal Occult Bld: NEGATIVE

## 2019-04-22 LAB — COPPER, SERUM: Copper: 67 ug/dL — ABNORMAL LOW (ref 80–158)

## 2019-04-22 NOTE — Progress Notes (Signed)
Cardiology Office Note   Date:  04/25/2019   ID:  Sarabi, Linck [birth year], MRN [id number] PCP:  [name], DO  Cardiologist:   No primary care provider on file. Referring:  [name], DO  Chief Complaint  Patient presents with  . Shortness of Breath      History of Present Illness: Stacey Reyes is a 74 y.o. female who is referred by [name], DO for evaluation of a heart murmur .  She saw Dr. Pernell Dupre for evaluation of palpitations.  She was seen in 2017 with SVT or afib suspected but not detected on event monitor.   Echo EF was normal in 2017.  I saw her with a murmur that was thought to be more prominent.   Echo EF was 75% with severe LVH.   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  She was admitted to the hospital after her last visit here.  She was found to have profound anemia.  I reviewed these records for this visit.  She was found to have severe B12 deficiency.  She required transfusions.    Prior to this she has been having significant weakness in her shortness of breath.  However, following transfusion is reviewed and she feels much better and back to baseline.  She is actually going to see immunologist soon.  She has not been having chest pressure, neck or arm discomfort.  She has had no palpitations, presyncope or syncope.  Has had no weight gain or edema.  Past Medical History:  Diagnosis Date  . Anemia   . Cataract    removed bilateral  . Chronic cystitis   . Heart murmur   . Hyperlipidemia   . Hypertension   . Personal history of colonic polyps-adenomas 01/07/2012   2009 - 2 diminutive adenomas (prior polyps also) 01/07/2012 - 2 diminutive adenomas      Past Surgical History:  Procedure Laterality Date  . BREAST BIOPSY Right    No Scar seen   . BREAST SURGERY     breast biopsy/ right /benign  . COLONOSCOPY  multiple     Current Outpatient Medications  Medication Sig Dispense Refill  . bisoprolol (ZEBETA) 5 MG tablet  Take 0.5 tablets (2.5 mg total) by mouth daily. 45 tablet 3  . cyanocobalamin ([employer] VITAMIN B12) 2000 MCG tablet Take 1 tablet (2,000 mcg total) by mouth daily.    Marland Kitchen lisinopril (ZESTRIL) 5 MG tablet Take 1 tablet (5 mg total) by mouth daily. 90 tablet 3  . Magnesium 200 MG TABS Take 1 tablet (200 mg total) by mouth daily. 30 tablet 6  . Multiple Vitamin (MULTIVITAMIN) capsule Take 1 capsule by mouth daily.    . Rosuvastatin Calcium 10 MG CPSP Take 10 mg by mouth at bedtime. 90 capsule 3  . thiamine (VITAMIN B-1) 100 MG tablet Take 100 mg by mouth daily.    . vitamin E 400 UNIT capsule Take 400 Units by mouth daily.      No current facility-administered medications for this visit.    Allergies:   Sulfur     ROS:  Please see the history of present illness.   Otherwise, review of systems are positive for none.   All other systems are reviewed and negative.    PHYSICAL EXAM: VS:  BP 136/76   Pulse 65   Temp (!) 97.2 F (36.2 C)   Ht 5\' 6"  (1.676 m)   Wt 190 lb 12.8 oz (86.5  kg)   BMI 30.80 kg/m  , BMI Body mass index is 30.8 kg/m. GENERAL:  Well appearing NECK:  No jugular venous distention, waveform within normal limits, carotid upstroke brisk and symmetric, no bruits, no thyromegaly LUNGS:  Clear to auscultation bilaterally BACK:  No CVA tenderness CHEST:  Unremarkable HEART:  PMI not displaced or sustained,S1 and S2 within normal limits, no S3, no S4, no clicks, no rubs, 2 out of 6 apical and right upper sternal border systolic murmur that is not increased with the strain phase of Valsalva and seems to be slightly more prominent with the patient seated upright, no diastolic murmurs ABD:  Flat, positive bowel sounds normal in frequency in pitch, no bruits, no rebound, no guarding, no midline pulsatile mass, no hepatomegaly, no splenomegaly EXT:  2 plus pulses throughout, no edema, no cyanosis no clubbing    EKG:  EKG is ordered today. The ekg ordered today demonstrates sinus  rhythm, rate 65, axis within normal limits, voltage criteria for left ventricular hypertrophy, no acute ST-T wave changes.  Repolarization.   Recent Labs: 03/23/2019: Magnesium 2.1; TSH 1.020 03/24/2019: B Natriuretic Peptide 462.0 04/19/2019: ALT 22; BUN 13; Creatinine, Ser 0.76; Hemoglobin 11.2; Platelets 243; Potassium 3.6; Sodium 133    Lipid Panel    Component Value Date/Time   CHOL 185 12/05/2011 0000   TRIG 187 (A) 12/05/2011 0000   HDL 44 12/05/2011 0000   LDLCALC 110 12/05/2011 0000      Wt Readings from Last 3 Encounters:  04/23/19 190 lb 12.8 oz (86.5 kg)  04/19/19 189 lb 14.4 oz (86.1 kg)  04/16/19 189 lb 3.2 oz (85.8 kg)      Other studies Reviewed: Additional studies/ records that were reviewed today include:   Holter, echo and recent lab results. Review of the above records demonstrates:  Please see elsewhere in the note.     ASSESSMENT AND PLAN:   Dyspnea:  This is improved status post transfusion.  He certainly has some element of diastolic dysfunction but seems to be well compensated.  I will be evaluating her hypertrophy as below.   LVH/HCM: I strongly suspect amyloid and will be ordering a PYP scan and MRI.  I will defer blood work to the hematologist.  We will see her back after these results.  Hypertension:  Her blood pressure is well controlled and she will continue the meds as listed.  Covid education: The patient is for vaccine.  Current medicines are reviewed at length with the patient today.  The patient does not have concerns regarding medicines.  The following changes have been made:  no change  Labs/ tests ordered today include:   Orders Placed This Encounter  Procedures  . MR Card Morphology Wo/W Cm  . Basic metabolic panel  . MYOCARDIAL AMYLOID IMAGING PLANAR AND SPECT     Disposition:   FU with me after the MRI/PYP scan.      Signed, Minus Breeding, MD  04/25/2019 6:55 AM    [facility]

## 2019-04-23 ENCOUNTER — Other Ambulatory Visit: Payer: Self-pay

## 2019-04-23 ENCOUNTER — Encounter: Payer: Self-pay | Admitting: Cardiology

## 2019-04-23 ENCOUNTER — Ambulatory Visit: Payer: BC Managed Care – PPO | Admitting: Cardiology

## 2019-04-23 VITALS — BP 136/76 | HR 65 | Temp 97.2°F | Ht 66.0 in | Wt 190.8 lb

## 2019-04-23 DIAGNOSIS — Z7189 Other specified counseling: Secondary | ICD-10-CM

## 2019-04-23 DIAGNOSIS — J45909 Unspecified asthma, uncomplicated: Secondary | ICD-10-CM

## 2019-04-23 DIAGNOSIS — I10 Essential (primary) hypertension: Secondary | ICD-10-CM | POA: Diagnosis not present

## 2019-04-23 DIAGNOSIS — R0602 Shortness of breath: Secondary | ICD-10-CM

## 2019-04-23 DIAGNOSIS — I422 Other hypertrophic cardiomyopathy: Secondary | ICD-10-CM

## 2019-04-23 LAB — METHYLMALONIC ACID, SERUM: Methylmalonic Acid, Quantitative: 174 nmol/L (ref 0–378)

## 2019-04-23 NOTE — Patient Instructions (Signed)
Medication Instructions:  No Changes *If you need a refill on your cardiac medications before your next appointment, please call your pharmacy*  Lab Work: Your physician recommends that you return for lab work one week before MRI.  Testing/Procedures: Your physician has requested that you have a cardiac MRI. Cardiac MRI uses a computer to create images of your heart as its beating, producing both still and moving pictures of your heart and major blood vessels. For further information please visit [email or web address] Please follow the instruction sheet given to you today for more information. They will call to schedule once approved by insurance.  Nuclear Amyloid Study  Follow-Up: At [facility], you and your health needs are our priority.  As part of our continuing mission to provide you with exceptional heart care, we have created designated Provider Care Teams.  These Care Teams include your primary Cardiologist (physician) and Advanced Practice Providers (APPs -  Physician Assistants and Nurse Practitioners) who all work together to provide you with the care you need, when you need it.  Your next appointment:   1 month(s)  The format for your next appointment:   In Person  Provider:   Minus Breeding, MD  Other Instructions  Nuclear Medicine Exam A nuclear medicine exam is a safe and painless imaging test. It helps your health care provider detect and diagnose diseases. It also provides information about the ways your organs work and how they are structured. For a nuclear medicine exam, you will be given a radioactive tracer. This substance is absorbed by your body's organs. A large scanning machine detects the tracer and creates pictures of the areas that your health care provider wants to know more about. There are several kinds of nuclear medicine exams. They include the following:  CT scan.  MRI scan.  PET scan.  SPECT scan. Tell your health care provider about:  Any  allergies you have.  All medicines you are taking, including vitamins, herbs, eye drops, creams, and over-the-counter medicines.  Any problems you or family members have had with anesthetic medicines.  Any blood disorders you have.  Any surgeries you have had.  Any medical conditions you have.  Whether you are pregnant or may be pregnant.  Whether you are nursing. What are the risks? Generally, this is a safe procedure. However, problems may occur, such as an allergic reaction to the tracer, but this is rare. What happens before the procedure? Medicines Ask your health care provider about:  Changing or stopping your regular medicines. This is especially important if you are taking diabetes medicines or blood thinners.  Taking medicines such as aspirin and ibuprofen. These medicines can thin your blood. Do not take these medicines unless your health care provider tells you to take them.  Taking over-the-counter medicines, vitamins, herbs, and supplements. General instructions  Follow instructions from your health care provider about eating or drinking restrictions.  Do not wear jewelry.  Wear loose, comfortable clothing. You may be asked to wear a hospital gown for the procedure.  Bring previous imaging studies, such as X-rays, with you to the exam if they are available. What happens during the procedure?   An IV may be inserted into one of your veins.  You will be asked to lie on a table or sit in a chair.  You will be given the radioactive tracer. You may get: ? A pill or liquid to swallow. ? An injection. ? Medicine through your IV. ? A gas to inhale.  A  large scanning machine will be used to create images of your body. After the pictures are taken, you may have to wait so your health care provider can make sure that enough images were taken. The procedure may vary among health care providers and hospitals. What happens after the procedure?  You may go home after  the procedure and return to your usual activities, unless your health care provider tells you otherwise.  Drink enough water to keep your urine pale yellow. This helps to remove the radioactive tracer from your body.  It is up to you to get the results of your procedure. Ask your health care provider, or the department that is doing the procedure, when your results will be ready.  Get help right away if you have problems breathing. Summary  A nuclear medicine exam is a safe and painless imaging test that provides information about how your organs are working. It is also used to detect and diagnose diseases of various body organs.  Follow your health care provider's instructions about eating and drinking restrictions. Ask whether you should change or stop any medicines.  During the procedure, you will be given a radioactive tracer. A large scanning machine will create images of your body.  You may go home after the procedure and return to your regular activities. Follow your health care provider's instructions.  Get help right away if you have problems breathing. This information is not intended to replace advice given to you by your health care provider. Make sure you discuss any questions you have with your health care provider. Document Revised: 01/28/2018 Document Reviewed: 01/28/2018 Elsevier Patient Education  [place].

## 2019-04-25 ENCOUNTER — Encounter: Payer: Self-pay | Admitting: Cardiology

## 2019-04-26 ENCOUNTER — Other Ambulatory Visit: Payer: Self-pay

## 2019-04-27 ENCOUNTER — Ambulatory Visit: Payer: Medicare Other | Admitting: Family Medicine

## 2019-04-29 ENCOUNTER — Ambulatory Visit: Payer: BC Managed Care – PPO | Admitting: Internal Medicine

## 2019-04-29 ENCOUNTER — Encounter: Payer: Self-pay | Admitting: Internal Medicine

## 2019-04-29 VITALS — BP 142/70 | HR 68 | Temp 97.4°F | Ht 66.0 in | Wt 189.8 lb

## 2019-04-29 DIAGNOSIS — D509 Iron deficiency anemia, unspecified: Secondary | ICD-10-CM | POA: Diagnosis not present

## 2019-04-29 DIAGNOSIS — D519 Vitamin B12 deficiency anemia, unspecified: Secondary | ICD-10-CM

## 2019-04-29 NOTE — Progress Notes (Signed)
Stacey Reyes 74 y.o. 12-12-1945 [id number]  Assessment & Plan:    Encounter Diagnoses  Name Primary?  . Iron deficiency anemia, unspecified iron deficiency anemia type Yes  . Anemia due to vitamin B12 deficiency, unspecified B12 deficiency type    I do not think any further investigation is needed.  Should she demonstrate signs of anemia in the face of  iron and B12 supplementation and can consider small bowel investigation.  She will see me as needed.  She is not on oral iron at this time and I do not think she is or has planned vitamin B12 dosing.  I will defer to hematology for further recommendations on how to do this.  She is aware to ask as well.  I appreciate the opportunity to care for this patient. CC: [name], DO Dr. Delton Coombes Subjective:   Chief Complaint: Anemia  HPI The patient is a 74 year old white woman known to me from prior evaluation of questionable melena and anemia in August of last year.  The colonoscopy demonstrated diverticulosis and 2 diminutive polyps, that were adenomatous.  EGD showed some erosive duodenitis the biopsies were nonspecific and she did not have celiac disease and a CLOtest was negative for H. pylori.  She was not on ferrous sulfate and did not take ferrous sulfate after that.  Hemoglobin was 11.1 and she was normocytic on August 21 and prior to that on August 10 it was 9.9.  Hemoglobin was then 5.6 on December 29I believe she received some parenteral iron there.MCV 74.  She was admitted to the hospital with a symptomatic anemia and transfused.  1 Feraheme infusion provided.  Hemoglobin 10 on January 6  Ferritin was 6 on December 30 and B12 was 145.  She has seen hematology.  She has been treated with B12 and that level is now normal. Hemoccults x3 - Ferritin up to 32 B12 461 Folate 8.5  Energy level breathing capacity good now Wt Readings from Last 3 Encounters:  04/29/19 189 lb 12.8 oz (86.1 kg)  04/23/19 190 lb  12.8 oz (86.5 kg)  04/19/19 189 lb 14.4 oz (86.1 kg)    Allergies  Allergen Reactions  . Sulfa Antibiotics Rash   Current Meds  Medication Sig  . bisoprolol (ZEBETA) 5 MG tablet Take 0.5 tablets (2.5 mg total) by mouth daily.  . cyanocobalamin ([employer] VITAMIN B12) 2000 MCG tablet Take 1 tablet (2,000 mcg total) by mouth daily.  Marland Kitchen lisinopril (ZESTRIL) 5 MG tablet Take 1 tablet (5 mg total) by mouth daily.  . Magnesium 200 MG TABS Take 1 tablet (200 mg total) by mouth daily.  . Multiple Vitamin (MULTIVITAMIN) capsule Take 1 capsule by mouth daily.  . Rosuvastatin Calcium 10 MG CPSP Take 10 mg by mouth at bedtime.  . thiamine (VITAMIN B-1) 100 MG tablet Take 100 mg by mouth daily.  . vitamin E 400 UNIT capsule Take 400 Units by mouth daily.    Past Medical History:  Diagnosis Date  . Anemia   . Cataract    removed bilateral  . Chronic cystitis   . Heart murmur   . Hyperlipidemia   . Hypertension   . Personal history of colonic polyps-adenomas 01/07/2012   2009 - 2 diminutive adenomas (prior polyps also) 01/07/2012 - 2 diminutive adenomas     Past Surgical History:  Procedure Laterality Date  . BREAST BIOPSY Right    No Scar seen   . COLONOSCOPY  multiple   Social History  Social History Narrative  . Not on file   family history includes Asthma in her brother; Breast cancer in her sister; Colon cancer (age of onset: 10) in her mother.   Review of Systems As above  Objective:   Physical Exam BP (!) 142/70   Pulse 68   Temp (!) 97.4 F (36.3 C)   Ht 5\' 6"  (1.676 m)   Wt 189 lb 12.8 oz (86.1 kg)   BMI 30.63 kg/m

## 2019-04-29 NOTE — Patient Instructions (Signed)
Good to see you today.  As we discussed I do not think any additional gastroenterology testing is needed.  I will be available if needed.  I appreciate the opportunity to care for you. Gatha Mayer, MD, [facility]

## 2019-04-30 ENCOUNTER — Telehealth: Payer: Self-pay | Admitting: *Deleted

## 2019-04-30 DIAGNOSIS — Z23 Encounter for immunization: Secondary | ICD-10-CM | POA: Diagnosis not present

## 2019-04-30 NOTE — Telephone Encounter (Signed)
Patient called and stated she received a call from [facility] regarding her out of pocket expense for her Cardiac MRI scheduled 05/05/19---she states she will have to call back to reschedule at a later date.  She is currently having other testing done and wants to see what those results are.

## 2019-05-02 ENCOUNTER — Encounter: Payer: Self-pay | Admitting: Internal Medicine

## 2019-05-04 ENCOUNTER — Telehealth (HOSPITAL_COMMUNITY): Payer: Self-pay

## 2019-05-04 ENCOUNTER — Other Ambulatory Visit (INDEPENDENT_AMBULATORY_CARE_PROVIDER_SITE_OTHER): Payer: BC Managed Care – PPO

## 2019-05-04 DIAGNOSIS — I10 Essential (primary) hypertension: Secondary | ICD-10-CM

## 2019-05-04 NOTE — Telephone Encounter (Signed)
Encounter complete. 

## 2019-05-05 ENCOUNTER — Other Ambulatory Visit: Payer: Self-pay

## 2019-05-05 ENCOUNTER — Inpatient Hospital Stay (HOSPITAL_COMMUNITY): Payer: BC Managed Care – PPO | Attending: Hematology | Admitting: Hematology

## 2019-05-05 ENCOUNTER — Encounter (HOSPITAL_COMMUNITY): Payer: Self-pay | Admitting: Hematology

## 2019-05-05 ENCOUNTER — Ambulatory Visit (HOSPITAL_COMMUNITY): Payer: BC Managed Care – PPO

## 2019-05-05 VITALS — BP 133/69 | HR 61 | Temp 97.1°F | Resp 18 | Wt 192.0 lb

## 2019-05-05 DIAGNOSIS — R42 Dizziness and giddiness: Secondary | ICD-10-CM

## 2019-05-05 DIAGNOSIS — I10 Essential (primary) hypertension: Secondary | ICD-10-CM | POA: Diagnosis not present

## 2019-05-05 DIAGNOSIS — Z79899 Other long term (current) drug therapy: Secondary | ICD-10-CM | POA: Diagnosis not present

## 2019-05-05 DIAGNOSIS — D649 Anemia, unspecified: Secondary | ICD-10-CM

## 2019-05-05 DIAGNOSIS — D509 Iron deficiency anemia, unspecified: Secondary | ICD-10-CM | POA: Insufficient documentation

## 2019-05-05 DIAGNOSIS — E538 Deficiency of other specified B group vitamins: Secondary | ICD-10-CM | POA: Insufficient documentation

## 2019-05-05 DIAGNOSIS — Z87891 Personal history of nicotine dependence: Secondary | ICD-10-CM | POA: Insufficient documentation

## 2019-05-05 MED ORDER — MECLIZINE HCL 25 MG PO TABS: 25.0000 mg | ORAL_TABLET | Freq: Three times a day (TID) | ORAL | 1 refills | Status: DC | PRN

## 2019-05-05 NOTE — Assessment & Plan Note (Addendum)
1.  Severe iron deficiency anemia: -Admitted on 03/24/2019 with hemoglobin 5.3 and ferritin of 6 and B12 of 145.  Status post 3 units PRBC and 1 infusion of Feraheme on 03/24/2019. -Colonoscopy on 11/17/2018 shows 2 diminutive polyps in the transverse colon, diverticulosis in the sigmoid colon and descending colon transverse colon. -EGD shows duodenal erosions. -Stool for occult blood was negative x3. -We discussed results from 04/19/2019 labs.  SPEP is negative.  Hemoglobin is 11.2.  MCV is 84.  LDH is 192.  Ferritin was low at 32.  Folic acid was normal.  Copper was low at 67.  B12 was normal. -I have recommended 2 infusions of Feraheme 1 week apart.  We will reevaluate her in 6 weeks with repeat labs.  2.  B12 deficiency: -B12 on 03/24/2019 was 145.  Repeat B12 on 04/19/2019 is normal. -She will continue B12 1 mg daily.  3.  Dizziness: -She reported dizziness when her head is in certain positions while in the bed.  She reports her room is spinning around her.  Denies any associated nausea or vomiting.  Denies any recent upper respiratory infections.  She has occasional dizziness when she stands up suddenly. -Her blood pressure today is 133/69.  We have prescribed meclizine 25 mg up to 3 times a day as needed to see if it helps. -If it does not improve after iron infusion, will consider imaging.

## 2019-05-05 NOTE — Patient Instructions (Addendum)
[place] at [facility] Discharge Instructions  You were seen today by Dr. Delton Coombes. He went over your recent lab results. He will schedule you rfor 2 doses of IV Iron. He will send in a prescription for meclizine to help with the dizziness. Pick up a copper supplement from your pharmacy and take daily. He will see you back in 6 weeks for labs and follow up.   Thank you for choosing [place] at [facility] to provide your oncology and hematology care.  To afford each patient quality time with our provider, please arrive at least 15 minutes before your scheduled appointment time.   If you have a lab appointment with the [place] please come in thru the  Main Entrance and check in at the main information desk  You need to re-schedule your appointment should you arrive 10 or more minutes late.  We strive to give you quality time with our providers, and arriving late affects you and other patients whose appointments are after yours.  Also, if you no show three or more times for appointments you may be dismissed from the clinic at the providers discretion.     Again, thank you for choosing [facility].  Our hope is that these requests will decrease the amount of time that you wait before being seen by our physicians.       _____________________________________________________________  Should you have questions after your visit to [facility], please contact our office at [phone] between the hours of 8:00 a.m. and 4:30 p.m.  Voicemails left after 4:00 p.m. will not be returned until the following business day.  For prescription refill requests, have your pharmacy contact our office and allow 72 hours.    Cancer Center Support Programs:   > Cancer Support Group  2nd Tuesday of the month 1pm-2pm, Journey Room

## 2019-05-05 NOTE — Progress Notes (Signed)
[place], [place] [address]   CLINIC:  Medical Oncology/Hematology  PCP:  [name], DO [place] [address] [phone]   REASON FOR VISIT:  Follow-up for iron deficiency anemia, B12 deficiency state.  CURRENT THERAPY: Parenteral iron therapy.    INTERVAL HISTORY:  [name] 74 y.o. female seen for follow-up of iron deficiency and B12 deficiency state.  Denies any bleeding per rectum or melena.  Last Feraheme was on 03/24/2019.  Appetite is 75%.  Energy levels are 25%.  Reports some dizziness when she stands up suddenly.  She also reports room spinning around her when she is lying in certain position.  She reports some headaches.    REVIEW OF SYSTEMS:  Review of Systems  Constitutional: Positive for fatigue.  Neurological: Positive for dizziness.  All other systems reviewed and are negative.    PAST MEDICAL/SURGICAL HISTORY:  Past Medical History:  Diagnosis Date  . Anemia   . Cataract    removed bilateral  . Chronic cystitis   . Heart murmur   . Hyperlipidemia   . Hypertension   . Personal history of colonic polyps-adenomas 01/07/2012   2009 - 2 diminutive adenomas (prior polyps also) 01/07/2012 - 2 diminutive adenomas     Past Surgical History:  Procedure Laterality Date  . BREAST BIOPSY Right    No Scar seen   . COLONOSCOPY  multiple     SOCIAL HISTORY:  Social History   Socioeconomic History  . Marital status: Married    Spouse name: Not on file  . Number of children: 1  . Years of education: Not on file  . Highest education level: Not on file  Occupational History  . Occupation: retired  Tobacco Use  . Smoking status: Former Smoker    Quit date: 12/23/1984    Years since quitting: 34.3  . Smokeless tobacco: Never Used  Substance and Sexual Activity  . Alcohol use: Yes    Comment: rare  . Drug use: No  . Sexual activity: Not on file  Other Topics Concern  . Not on file    Social History Narrative   Patient is married and retired and has 1 grown child   Social Determinants of Radio broadcast assistant Strain:   . Difficulty of Paying Living Expenses: Not on file  Food Insecurity:   . Worried About Charity fundraiser in the Last Year: Not on file  . Ran Out of Food in the Last Year: Not on file  Transportation Needs:   . Lack of Transportation (Medical): Not on file  . Lack of Transportation (Non-Medical): Not on file  Physical Activity:   . Days of Exercise per Week: Not on file  . Minutes of Exercise per Session: Not on file  Stress:   . Feeling of Stress : Not on file  Social Connections:   . Frequency of Communication with Friends and Family: Not on file  . Frequency of Social Gatherings with Friends and Family: Not on file  . Attends Religious Services: Not on file  . Active Member of Clubs or Organizations: Not on file  . Attends Archivist Meetings: Not on file  . Marital Status: Not on file  Intimate Partner Violence:   . Fear of Current or Ex-Partner: Not on file  . Emotionally Abused: Not on file  . Physically Abused: Not on file  . Sexually Abused: Not on file  FAMILY HISTORY:  Family History  Problem Relation Age of Onset  . Colon cancer Mother 77       80's  . Breast cancer Sister   . Asthma Brother   . Colon polyps Neg Hx   . Esophageal cancer Neg Hx   . Rectal cancer Neg Hx   . Stomach cancer Neg Hx     CURRENT MEDICATIONS:  Outpatient Encounter Medications as of 05/05/2019  Medication Sig  . bisoprolol (ZEBETA) 5 MG tablet Take 0.5 tablets (2.5 mg total) by mouth daily.  . cyanocobalamin ([employer] VITAMIN B12) 2000 MCG tablet Take 1 tablet (2,000 mcg total) by mouth daily.  Marland Kitchen lisinopril (ZESTRIL) 5 MG tablet Take 1 tablet (5 mg total) by mouth daily.  . Magnesium 200 MG TABS Take 1 tablet (200 mg total) by mouth daily.  . Multiple Vitamin (MULTIVITAMIN) capsule Take 1 capsule by mouth daily.  . Rosuvastatin  Calcium 10 MG CPSP Take 10 mg by mouth at bedtime.  . thiamine (VITAMIN B-1) 100 MG tablet Take 100 mg by mouth daily.  . vitamin E 400 UNIT capsule Take 400 Units by mouth daily.   . meclizine (ANTIVERT) 25 MG tablet Take 1 tablet (25 mg total) by mouth 3 (three) times daily as needed for dizziness.   No facility-administered encounter medications on file as of 05/05/2019.    ALLERGIES:  Allergies  Allergen Reactions  . Sulfa Antibiotics Rash     PHYSICAL EXAM:  ECOG Performance status: 1  Vitals:   05/05/19 1544  BP: 133/69  Pulse: 61  Resp: 18  Temp: (!) 97.1 F (36.2 C)  SpO2: 98%   Filed Weights   05/05/19 1544  Weight: 192 lb (87.1 kg)    Physical Exam Vitals reviewed.  Constitutional:      Appearance: Normal appearance.  Cardiovascular:     Rate and Rhythm: Normal rate and regular rhythm.     Heart sounds: Normal heart sounds.  Pulmonary:     Effort: Pulmonary effort is normal.     Breath sounds: Normal breath sounds.  Abdominal:     General: There is no distension.     Palpations: Abdomen is soft. There is no mass.  Skin:    General: Skin is warm.  Neurological:     General: No focal deficit present.     Mental Status: She is alert and oriented to person, place, and time.  Psychiatric:        Mood and Affect: Mood normal.        Behavior: Behavior normal.      LABORATORY DATA:  I have reviewed the labs as listed.  CBC    Component Value Date/Time   WBC 6.4 04/19/2019 1401   RBC 4.26 04/19/2019 1402   RBC 4.38 04/19/2019 1401   HGB 11.2 (L) 04/19/2019 1401   HGB 10.0 (L) 03/31/2019 1044   HCT 36.8 04/19/2019 1401   HCT 32.9 (L) 03/31/2019 1044   PLT 243 04/19/2019 1401   PLT 214 03/31/2019 1044   MCV 84.0 04/19/2019 1401   MCV 82 03/31/2019 1044   MCH 25.6 (L) 04/19/2019 1401   MCHC 30.4 04/19/2019 1401   RDW 22.7 (H) 04/19/2019 1401   RDW 21.8 (H) 03/31/2019 1044   LYMPHSABS 2.6 04/19/2019 1401   LYMPHSABS 1.8 03/02/2018 1351    MONOABS 0.6 04/19/2019 1401   EOSABS 0.2 04/19/2019 1401   EOSABS 0.2 03/02/2018 1351   BASOSABS 0.1 04/19/2019 1401   BASOSABS 0.1 03/02/2018  1351   CMP Latest Ref Rng & Units 04/19/2019 03/25/2019 03/24/2019  Glucose 70 - 99 mg/dL 106(H) 94 113(H)  BUN 8 - 23 mg/dL 13 8 14   Creatinine 0.44 - 1.00 mg/dL 0.76 0.62 0.65  Sodium 135 - 145 mmol/L 133(L) 135 133(L)  Potassium 3.5 - 5.1 mmol/L 3.6 3.8 3.6  Chloride 98 - 111 mmol/L 97(L) 101 104  CO2 22 - 32 mmol/L 28 24 22   Calcium 8.9 - 10.3 mg/dL 9.0 8.8(L) 8.4(L)  Total Protein 6.5 - 8.1 g/dL 6.5 - 5.9(L)  Total Bilirubin 0.3 - 1.2 mg/dL 0.8 - 0.7  Alkaline Phos 38 - 126 U/L 38 - 30(L)  AST 15 - 41 U/L 31 - 15  ALT 0 - 44 U/L 22 - 14       DIAGNOSTIC IMAGING:  I have independently reviewed the scans and discussed with the patient.      ASSESSMENT & PLAN:   Symptomatic anemia 1.  Severe iron deficiency anemia: -Admitted on 03/24/2019 with hemoglobin 5.3 and ferritin of 6 and B12 of 145.  Status post 3 units PRBC and 1 infusion of Feraheme on 03/24/2019. -Colonoscopy on 11/17/2018 shows 2 diminutive polyps in the transverse colon, diverticulosis in the sigmoid colon and descending colon transverse colon. -EGD shows duodenal erosions. -Stool for occult blood was negative x3. -We discussed results from 04/19/2019 labs.  SPEP is negative.  Hemoglobin is 11.2.  MCV is 84.  LDH is 192.  Ferritin was low at 32.  Folic acid was normal.  Copper was low at 67.  B12 was normal. -I have recommended 2 infusions of Feraheme 1 week apart.  We will reevaluate her in 6 weeks with repeat labs.  2.  B12 deficiency: -B12 on 03/24/2019 was 145.  Repeat B12 on 04/19/2019 is normal. -She will continue B12 1 mg daily.  3.  Dizziness: -She reported dizziness when her head is in certain positions while in the bed.  She reports her room is spinning around her.  Denies any associated nausea or vomiting.  Denies any recent upper respiratory infections.   She has occasional dizziness when she stands up suddenly. -Her blood pressure today is 133/69.  We have prescribed meclizine 25 mg up to 3 times a day as needed to see if it helps. -If it does not improve after iron infusion, will consider imaging.      Orders placed this encounter:  Orders Placed This Encounter  Procedures  . CBC with Differential/Platelet  . Comprehensive metabolic panel  . Iron and TIBC  . Ferritin  . Vitamin B12  . Folate      Derek Jack, MD [place] [phone]

## 2019-05-06 ENCOUNTER — Other Ambulatory Visit: Payer: Self-pay

## 2019-05-06 ENCOUNTER — Other Ambulatory Visit (HOSPITAL_COMMUNITY): Payer: Self-pay | Admitting: Nurse Practitioner

## 2019-05-06 ENCOUNTER — Ambulatory Visit (HOSPITAL_COMMUNITY)
Admission: RE | Admit: 2019-05-06 | Discharge: 2019-05-06 | Disposition: A | Discharge status: Home / Self Care | Payer: BC Managed Care – PPO | Source: Ambulatory Visit | Attending: Cardiovascular Disease | Admitting: Cardiovascular Disease

## 2019-05-06 DIAGNOSIS — R0602 Shortness of breath: Secondary | ICD-10-CM | POA: Diagnosis not present

## 2019-05-06 DIAGNOSIS — I422 Other hypertrophic cardiomyopathy: Secondary | ICD-10-CM | POA: Diagnosis not present

## 2019-05-06 DIAGNOSIS — I10 Essential (primary) hypertension: Secondary | ICD-10-CM

## 2019-05-06 MED ORDER — TECHNETIUM TC 99M PYROPHOSPHATE
20.2000 | Freq: Once | INTRAVENOUS | Status: AC
  Administered 2019-05-06: 20.2 via INTRAVENOUS

## 2019-05-07 ENCOUNTER — Encounter (HOSPITAL_COMMUNITY): Payer: Self-pay

## 2019-05-07 ENCOUNTER — Inpatient Hospital Stay (HOSPITAL_COMMUNITY): Payer: BC Managed Care – PPO

## 2019-05-07 ENCOUNTER — Other Ambulatory Visit: Payer: Self-pay

## 2019-05-07 VITALS — BP 146/50 | HR 66 | Temp 97.1°F | Resp 18

## 2019-05-07 DIAGNOSIS — Z87891 Personal history of nicotine dependence: Secondary | ICD-10-CM | POA: Diagnosis not present

## 2019-05-07 DIAGNOSIS — E538 Deficiency of other specified B group vitamins: Secondary | ICD-10-CM | POA: Diagnosis not present

## 2019-05-07 DIAGNOSIS — I10 Essential (primary) hypertension: Secondary | ICD-10-CM | POA: Diagnosis not present

## 2019-05-07 DIAGNOSIS — Z79899 Other long term (current) drug therapy: Secondary | ICD-10-CM | POA: Diagnosis not present

## 2019-05-07 DIAGNOSIS — D509 Iron deficiency anemia, unspecified: Secondary | ICD-10-CM | POA: Diagnosis not present

## 2019-05-07 DIAGNOSIS — D649 Anemia, unspecified: Secondary | ICD-10-CM

## 2019-05-07 LAB — BASIC METABOLIC PANEL
BUN/Creatinine Ratio: 11 — ABNORMAL LOW (ref 12–28)
BUN: 8 mg/dL (ref 8–27)
CO2: 25 mmol/L (ref 20–29)
Calcium: 9.3 mg/dL (ref 8.7–10.3)
Chloride: 101 mmol/L (ref 96–106)
Creatinine, Ser: 0.71 mg/dL (ref 0.57–1.00)
GFR calc Af Amer: 97 mL/min/{1.73_m2} (ref >59)
GFR calc non Af Amer: 84 mL/min/{1.73_m2} (ref >59)
Glucose: 80 mg/dL (ref 65–99)
Potassium: 4.5 mmol/L (ref 3.5–5.2)
Sodium: 138 mmol/L (ref 134–144)

## 2019-05-07 MED ORDER — SODIUM CHLORIDE 0.9 % IV SOLN
510.0000 mg | Freq: Once | INTRAVENOUS | Status: AC
  Administered 2019-05-07: 510 mg via INTRAVENOUS
  Filled 2019-05-07: qty 510

## 2019-05-07 MED ORDER — SODIUM CHLORIDE 0.9 % IV SOLN: Freq: Once | INTRAVENOUS | Status: AC

## 2019-05-07 NOTE — Patient Instructions (Signed)
[facility] at [facility]  Discharge Instructions:   _______________________________________________________________  Thank you for choosing [facility] at [facility] to provide your oncology and hematology care.  To afford each patient quality time with our providers, please arrive at least 15 minutes before your scheduled appointment.  You need to re-schedule your appointment if you arrive 10 or more minutes late.  We strive to give you quality time with our providers, and arriving late affects you and other patients whose appointments are after yours.  Also, if you no show three or more times for appointments you may be dismissed from the clinic.  Again, thank you for choosing [facility] at [facility]. Our hope is that these requests will allow you access to exceptional care and in a timely manner. _______________________________________________________________  If you have questions after your visit, please contact our office at [phone] between the hours of 8:30 a.m. and 5:00 p.m. Voicemails left after 4:30 p.m. will not be returned until the following business day. _______________________________________________________________  For prescription refill requests, have your pharmacy contact our office. _______________________________________________________________  Recommendations made by the consultant and any test results will be sent to your referring physician. _______________________________________________________________ 

## 2019-05-07 NOTE — Progress Notes (Signed)
Patient presents today for Feraheme infusion. Vital signs stable. Patient has no complaints of any pain today.Patient denies any significant changes since her last visit.    Feraheme given today per MD orders. Tolerated infusion without adverse affects. Vital signs stable. No complaints at this time. Discharged from clinic ambulatory. F/U with [facility] as scheduled.

## 2019-05-13 ENCOUNTER — Other Ambulatory Visit: Payer: Self-pay | Admitting: Family Medicine

## 2019-05-14 ENCOUNTER — Inpatient Hospital Stay (HOSPITAL_COMMUNITY): Payer: BC Managed Care – PPO

## 2019-05-14 ENCOUNTER — Encounter (HOSPITAL_COMMUNITY): Payer: Self-pay

## 2019-05-14 ENCOUNTER — Telehealth: Payer: Self-pay | Admitting: *Deleted

## 2019-05-14 ENCOUNTER — Other Ambulatory Visit: Payer: Self-pay

## 2019-05-14 VITALS — BP 134/58 | HR 70 | Temp 97.7°F | Resp 18

## 2019-05-14 DIAGNOSIS — D509 Iron deficiency anemia, unspecified: Secondary | ICD-10-CM | POA: Diagnosis not present

## 2019-05-14 DIAGNOSIS — I10 Essential (primary) hypertension: Secondary | ICD-10-CM | POA: Diagnosis not present

## 2019-05-14 DIAGNOSIS — Z87891 Personal history of nicotine dependence: Secondary | ICD-10-CM | POA: Diagnosis not present

## 2019-05-14 DIAGNOSIS — Z79899 Other long term (current) drug therapy: Secondary | ICD-10-CM | POA: Diagnosis not present

## 2019-05-14 DIAGNOSIS — D649 Anemia, unspecified: Secondary | ICD-10-CM

## 2019-05-14 DIAGNOSIS — E538 Deficiency of other specified B group vitamins: Secondary | ICD-10-CM | POA: Diagnosis not present

## 2019-05-14 MED ORDER — SODIUM CHLORIDE 0.9 % IV SOLN: Freq: Once | INTRAVENOUS | Status: AC

## 2019-05-14 MED ORDER — SODIUM CHLORIDE 0.9 % IV SOLN
510.0000 mg | Freq: Once | INTRAVENOUS | Status: AC
  Administered 2019-05-14: 510 mg via INTRAVENOUS
  Filled 2019-05-14: qty 17

## 2019-05-14 NOTE — Patient Instructions (Signed)
[facility] at [facility] Discharge Instructions  Received Feraheme infusion today. Follow-up as scheduled. Call clinic for any questions or concerns   Thank you for choosing [facility] at [facility] to provide your oncology and hematology care.  To afford each patient quality time with our provider, please arrive at least 15 minutes before your scheduled appointment time.   If you have a lab appointment with the Cancer Center please come in thru the Main Entrance and check in at the main information desk.  You need to re-schedule your appointment should you arrive 10 or more minutes late.  We strive to give you quality time with our providers, and arriving late affects you and other patients whose appointments are after yours.  Also, if you no show three or more times for appointments you may be dismissed from the clinic at the providers discretion.     Again, thank you for choosing [facility].  Our hope is that these requests will decrease the amount of time that you wait before being seen by our physicians.       _____________________________________________________________  Should you have questions after your visit to [facility], please contact our office at [phone] between the hours of 8:00 a.m. and 4:30 p.m.  Voicemails left after 4:00 p.m. will not be returned until the following business day.  For prescription refill requests, have your pharmacy contact our office and allow 72 hours.    Due to Covid, you will need to wear a mask upon entering the hospital. If you do not have a mask, a mask will be given to you at the Main Entrance upon arrival. For doctor visits, patients may have 1 support person with them. For treatment visits, patients can not have anyone with them due to social distancing guidelines and our immunocompromised population.     

## 2019-05-14 NOTE — Telephone Encounter (Signed)
Will forward to Dr Collingdale as FYI 

## 2019-05-14 NOTE — Progress Notes (Signed)
Stacey Reyes tolerated Feraheme infusion well without complaints or incident. Peripheral IV site checked with positive blood return noted prior to and after infusion. VSS upon discharge. Pt discharged self ambulatory in satisfactory condition

## 2019-05-14 NOTE — Telephone Encounter (Signed)
-----   Message from Roland Earl sent at 05/14/2019  1:51 PM EST ----- This  patient was schedule for her MRI--but she wanted to cancel at this time due to finances. ----- Message ----- From: Earvin Hansen, LPN Sent: [id number]   1:20 PM EST To: Nelly Laurence, #  Hey Not sure who is ordering MRI's but just wanted to follow up on this one needing to be scheduled  Thanks Rip Harbour

## 2019-05-20 NOTE — Telephone Encounter (Signed)
Will forward to Dr Percival Spanish for review as he is her primary cardiologist. .

## 2019-05-22 NOTE — Telephone Encounter (Signed)
Please schedule follow up with me to discuss MRI.

## 2019-05-24 NOTE — Telephone Encounter (Signed)
Scheduled for Dr. Percival Spanish on 4/9 at 4pm.

## 2019-05-29 DIAGNOSIS — Z23 Encounter for immunization: Secondary | ICD-10-CM | POA: Diagnosis not present

## 2019-06-14 ENCOUNTER — Inpatient Hospital Stay (HOSPITAL_COMMUNITY): Payer: BC Managed Care – PPO | Attending: Hematology

## 2019-06-14 ENCOUNTER — Other Ambulatory Visit: Payer: Self-pay

## 2019-06-14 DIAGNOSIS — I10 Essential (primary) hypertension: Secondary | ICD-10-CM | POA: Insufficient documentation

## 2019-06-14 DIAGNOSIS — D509 Iron deficiency anemia, unspecified: Secondary | ICD-10-CM | POA: Diagnosis not present

## 2019-06-14 DIAGNOSIS — E538 Deficiency of other specified B group vitamins: Secondary | ICD-10-CM | POA: Insufficient documentation

## 2019-06-14 DIAGNOSIS — D124 Benign neoplasm of descending colon: Secondary | ICD-10-CM | POA: Insufficient documentation

## 2019-06-14 DIAGNOSIS — D123 Benign neoplasm of transverse colon: Secondary | ICD-10-CM | POA: Insufficient documentation

## 2019-06-14 DIAGNOSIS — Z87891 Personal history of nicotine dependence: Secondary | ICD-10-CM | POA: Insufficient documentation

## 2019-06-14 DIAGNOSIS — K5730 Diverticulosis of large intestine without perforation or abscess without bleeding: Secondary | ICD-10-CM | POA: Insufficient documentation

## 2019-06-14 DIAGNOSIS — Z79899 Other long term (current) drug therapy: Secondary | ICD-10-CM | POA: Diagnosis not present

## 2019-06-14 DIAGNOSIS — E785 Hyperlipidemia, unspecified: Secondary | ICD-10-CM | POA: Insufficient documentation

## 2019-06-14 DIAGNOSIS — D125 Benign neoplasm of sigmoid colon: Secondary | ICD-10-CM | POA: Diagnosis not present

## 2019-06-14 DIAGNOSIS — D649 Anemia, unspecified: Secondary | ICD-10-CM

## 2019-06-14 LAB — FOLATE: Folate: 9.5 ng/mL (ref >5.9)

## 2019-06-14 LAB — COMPREHENSIVE METABOLIC PANEL
ALT: 20 U/L (ref 0–44)
AST: 27 U/L (ref 15–41)
Albumin: 4.2 g/dL (ref 3.5–5.0)
Alkaline Phosphatase: 43 U/L (ref 38–126)
Anion gap: 11 (ref 5–15)
BUN: 10 mg/dL (ref 8–23)
CO2: 26 mmol/L (ref 22–32)
Calcium: 9.4 mg/dL (ref 8.9–10.3)
Chloride: 101 mmol/L (ref 98–111)
Creatinine, Ser: 0.59 mg/dL (ref 0.44–1.00)
GFR calc Af Amer: >60 mL/min (ref >60)
GFR calc non Af Amer: >60 mL/min (ref >60)
Glucose, Bld: 100 mg/dL — ABNORMAL HIGH (ref 70–99)
Potassium: 3.8 mmol/L (ref 3.5–5.1)
Sodium: 138 mmol/L (ref 135–145)
Total Bilirubin: 0.9 mg/dL (ref 0.3–1.2)
Total Protein: 6.8 g/dL (ref 6.5–8.1)

## 2019-06-14 LAB — CBC WITH DIFFERENTIAL/PLATELET
Abs Immature Granulocytes: 0.02 10*3/uL (ref 0.00–0.07)
Basophils Absolute: 0.1 10*3/uL (ref 0.0–0.1)
Basophils Relative: 1 %
Eosinophils Absolute: 0.3 10*3/uL (ref 0.0–0.5)
Eosinophils Relative: 4 %
HCT: 39.4 % (ref 36.0–46.0)
Hemoglobin: 12.8 g/dL (ref 12.0–15.0)
Immature Granulocytes: 0 %
Lymphocytes Relative: 33 %
Lymphs Abs: 2.3 10*3/uL (ref 0.7–4.0)
MCH: 28.6 pg (ref 26.0–34.0)
MCHC: 32.5 g/dL (ref 30.0–36.0)
MCV: 88.1 fL (ref 80.0–100.0)
Monocytes Absolute: 0.7 10*3/uL (ref 0.1–1.0)
Monocytes Relative: 10 %
Neutro Abs: 3.8 10*3/uL (ref 1.7–7.7)
Neutrophils Relative %: 52 %
Platelets: 213 10*3/uL (ref 150–400)
RBC: 4.47 MIL/uL (ref 3.87–5.11)
RDW: 17.7 % — ABNORMAL HIGH (ref 11.5–15.5)
WBC: 7.1 10*3/uL (ref 4.0–10.5)
nRBC: 0 % (ref 0.0–0.2)

## 2019-06-14 LAB — IRON AND TIBC
Iron: 106 ug/dL (ref 28–170)
Saturation Ratios: 30 % (ref 10.4–31.8)
TIBC: 355 ug/dL (ref 250–450)
UIBC: 249 ug/dL

## 2019-06-14 LAB — VITAMIN B12: Vitamin B-12: 449 pg/mL (ref 180–914)

## 2019-06-14 LAB — FERRITIN: Ferritin: 84 ng/mL (ref 11–307)

## 2019-06-17 ENCOUNTER — Inpatient Hospital Stay (HOSPITAL_COMMUNITY): Payer: BC Managed Care – PPO | Admitting: Nurse Practitioner

## 2019-06-17 ENCOUNTER — Other Ambulatory Visit: Payer: Self-pay

## 2019-06-17 DIAGNOSIS — Z87891 Personal history of nicotine dependence: Secondary | ICD-10-CM | POA: Diagnosis not present

## 2019-06-17 DIAGNOSIS — D125 Benign neoplasm of sigmoid colon: Secondary | ICD-10-CM | POA: Diagnosis not present

## 2019-06-17 DIAGNOSIS — D649 Anemia, unspecified: Secondary | ICD-10-CM

## 2019-06-17 DIAGNOSIS — D509 Iron deficiency anemia, unspecified: Secondary | ICD-10-CM | POA: Diagnosis not present

## 2019-06-17 DIAGNOSIS — E538 Deficiency of other specified B group vitamins: Secondary | ICD-10-CM | POA: Diagnosis not present

## 2019-06-17 DIAGNOSIS — D123 Benign neoplasm of transverse colon: Secondary | ICD-10-CM | POA: Diagnosis not present

## 2019-06-17 DIAGNOSIS — I10 Essential (primary) hypertension: Secondary | ICD-10-CM | POA: Diagnosis not present

## 2019-06-17 DIAGNOSIS — E785 Hyperlipidemia, unspecified: Secondary | ICD-10-CM | POA: Diagnosis not present

## 2019-06-17 DIAGNOSIS — D124 Benign neoplasm of descending colon: Secondary | ICD-10-CM | POA: Diagnosis not present

## 2019-06-17 DIAGNOSIS — Z79899 Other long term (current) drug therapy: Secondary | ICD-10-CM | POA: Diagnosis not present

## 2019-06-17 DIAGNOSIS — K5730 Diverticulosis of large intestine without perforation or abscess without bleeding: Secondary | ICD-10-CM | POA: Diagnosis not present

## 2019-06-17 NOTE — Progress Notes (Signed)
Stacey Reyes, [place] [address]   CLINIC:  Medical Oncology/Hematology  PCP:  Stacey Norlander, DO [place] [address] [phone]   REASON FOR VISIT: Follow-up for severe iron deficiency anemia  CURRENT THERAPY: Intermittent iron infusions  INTERVAL HISTORY:  Stacey Reyes 74 y.o. female returns for routine follow-up for iron deficiency anemia.  Patient reports she is feeling much better since her last visit.  She reports the iron infusions gave her energy.  She does report she still has occasional fatigue.  She denies any bright red bleeding per rectum.  She denies any easy bruising or bleeding. Denies any nausea, vomiting, or diarrhea. Denies any new pains. Had not noticed any recent bleeding such as epistaxis, hematuria or hematochezia. Denies recent chest pain on exertion, shortness of breath on minimal exertion, pre-syncopal episodes, or palpitations. Denies any numbness or tingling in hands or feet. Denies any recent fevers, infections, or recent hospitalizations. Patient reports appetite at 100% and energy level at 25%.  She is eating well maintaining her weight at this time.    REVIEW OF SYSTEMS:  Review of Systems  Constitutional: Positive for fatigue.  Cardiovascular: Positive for leg swelling.  All other systems reviewed and are negative.    PAST MEDICAL/SURGICAL HISTORY:  Past Medical History:  Diagnosis Date  . Anemia   . Cataract    removed bilateral  . Chronic cystitis   . Heart murmur   . Hyperlipidemia   . Hypertension   . Personal history of colonic polyps-adenomas 01/07/2012   2009 - 2 diminutive adenomas (prior polyps also) 01/07/2012 - 2 diminutive adenomas     Past Surgical History:  Procedure Laterality Date  . BREAST BIOPSY Right    No Scar seen   . COLONOSCOPY  multiple     SOCIAL HISTORY:  Social History   Socioeconomic History  . Marital status: Married    Spouse name: Not on file    . Number of children: 1  . Years of education: Not on file  . Highest education level: Not on file  Occupational History  . Occupation: retired  Tobacco Use  . Smoking status: Former Smoker    Quit date: 12/23/1984    Years since quitting: 34.5  . Smokeless tobacco: Never Used  Substance and Sexual Activity  . Alcohol use: Yes    Comment: rare  . Drug use: No  . Sexual activity: Not on file  Other Topics Concern  . Not on file  Social History Narrative   Patient is married and retired and has 1 grown child   Social Determinants of Radio broadcast assistant Strain:   . Difficulty of Paying Living Expenses:   Food Insecurity:   . Worried About Charity fundraiser in the Last Year:   . Arboriculturist in the Last Year:   Transportation Needs:   . Film/video editor (Medical):   Marland Kitchen Lack of Transportation (Non-Medical):   Physical Activity:   . Days of Exercise per Week:   . Minutes of Exercise per Session:   Stress:   . Feeling of Stress :   Social Connections:   . Frequency of Communication with Friends and Family:   . Frequency of Social Gatherings with Friends and Family:   . Attends Religious Services:   . Active Member of Clubs or Organizations:   . Attends Archivist Meetings:   Marland Kitchen Marital Status:  Intimate Partner Violence:   . Fear of Current or Ex-Partner:   . Emotionally Abused:   Marland Kitchen Physically Abused:   . Sexually Abused:     FAMILY HISTORY:  Family History  Problem Relation Age of Onset  . Colon cancer Mother 36       80's  . Breast cancer Sister   . Asthma Brother   . Colon polyps Neg Hx   . Esophageal cancer Neg Hx   . Rectal cancer Neg Hx   . Stomach cancer Neg Hx     CURRENT MEDICATIONS:  Outpatient Encounter Medications as of 06/17/2019  Medication Sig  . bisoprolol (ZEBETA) 5 MG tablet Take 0.5 tablets (2.5 mg total) by mouth daily.  . cyanocobalamin ([employer] VITAMIN B12) 2000 MCG tablet Take 1 tablet (2,000 mcg total) by mouth  daily.  Marland Kitchen lisinopril (ZESTRIL) 5 MG tablet Take 1 tablet (5 mg total) by mouth daily.  . Magnesium 200 MG TABS Take 1 tablet (200 mg total) by mouth daily.  . Multiple Vitamin (MULTIVITAMIN) capsule Take 1 capsule by mouth daily.  . rosuvastatin (CRESTOR) 10 MG tablet TAKE 1 TABLET BY MOUTH AT  BEDTIME  . Rosuvastatin Calcium 10 MG CPSP Take 10 mg by mouth at bedtime.  . thiamine (VITAMIN B-1) 100 MG tablet Take 100 mg by mouth daily.  . vitamin E 400 UNIT capsule Take 400 Units by mouth daily.   . meclizine (ANTIVERT) 25 MG tablet Take 1 tablet (25 mg total) by mouth 3 (three) times daily as needed for dizziness. (Patient not taking: Reported on 06/17/2019)   No facility-administered encounter medications on file as of 06/17/2019.    ALLERGIES:  Allergies  Allergen Reactions  . Sulfa Antibiotics Rash     PHYSICAL EXAM:  ECOG Performance status: 1  Vitals:   06/17/19 1420  BP: 113/77  Pulse: 65  Resp: 18  Temp: (!) 96.9 F (36.1 C)  SpO2: 96%   Filed Weights   06/17/19 1420  Weight: 197 lb 12.8 oz (89.7 kg)    Physical Exam Constitutional:      Appearance: Normal appearance. She is normal weight.  Cardiovascular:     Rate and Rhythm: Normal rate and regular rhythm.     Heart sounds: Normal heart sounds.  Pulmonary:     Effort: Pulmonary effort is normal.     Breath sounds: Normal breath sounds.  Abdominal:     General: Bowel sounds are normal.     Palpations: Abdomen is soft.  Musculoskeletal:        General: Normal range of motion.  Skin:    General: Skin is warm.  Neurological:     Mental Status: She is alert and oriented to person, place, and time. Mental status is at baseline.  Psychiatric:        Mood and Affect: Mood normal.        Behavior: Behavior normal.        Thought Content: Thought content normal.        Judgment: Judgment normal.      LABORATORY DATA:  I have reviewed the labs as listed.  CBC    Component Value Date/Time   WBC 7.1  06/14/2019 1156   RBC 4.47 06/14/2019 1156   HGB 12.8 06/14/2019 1156   HGB 10.0 (L) 03/31/2019 1044   HCT 39.4 06/14/2019 1156   HCT 32.9 (L) 03/31/2019 1044   PLT 213 06/14/2019 1156   PLT 214 03/31/2019 1044   MCV 88.1 06/14/2019  1156   MCV 82 03/31/2019 1044   MCH 28.6 06/14/2019 1156   MCHC 32.5 06/14/2019 1156   RDW 17.7 (H) 06/14/2019 1156   RDW 21.8 (H) 03/31/2019 1044   LYMPHSABS 2.3 06/14/2019 1156   LYMPHSABS 1.8 03/02/2018 1351   MONOABS 0.7 06/14/2019 1156   EOSABS 0.3 06/14/2019 1156   EOSABS 0.2 03/02/2018 1351   BASOSABS 0.1 06/14/2019 1156   BASOSABS 0.1 03/02/2018 1351   CMP Latest Ref Rng & Units 06/14/2019 05/06/2019 04/19/2019  Glucose 70 - 99 mg/dL 100(H) 80 106(H)  BUN 8 - 23 mg/dL 10 8 13   Creatinine 0.44 - 1.00 mg/dL 0.59 0.71 0.76  Sodium 135 - 145 mmol/L 138 138 133(L)  Potassium 3.5 - 5.1 mmol/L 3.8 4.5 3.6  Chloride 98 - 111 mmol/L 101 101 97(L)  CO2 22 - 32 mmol/L 26 25 28   Calcium 8.9 - 10.3 mg/dL 9.4 9.3 9.0  Total Protein 6.5 - 8.1 g/dL 6.8 - 6.5  Total Bilirubin 0.3 - 1.2 mg/dL 0.9 - 0.8  Alkaline Phos 38 - 126 U/L 43 - 38  AST 15 - 41 U/L 27 - 31  ALT 0 - 44 U/L 20 - 22     I personally performed a face-to-face visit,   All questions were answered to patient's stated satisfaction. Encouraged patient to call with any new concerns or questions before his next visit to the cancer center and we can certain see him sooner, if needed.     ASSESSMENT & PLAN:   Symptomatic anemia 1.  Severe iron deficiency anemia: -Admitted on 03/24/2019 with hemoglobin 5.3 and ferritin of 6 and B12 of 145.  Status post 3 units of PRBC and 1 infusion of Feraheme on 03/24/2019. -Colonoscopy on 11/17/2018 showed 2 diminutive polyps in the transverse colon, diverticulosis in the sigmoid colon and descending colon and transverse colon. -EGD showed duodenal erosions. -Stool for occult blood was negative x3. -Work-up including: SPEP was negative, B12 was normal,  copper was slightly low at 67.  Folic acid was normal.  Hemoglobin 11.2 and ferritin 32.  She was given 2 infusions of Feraheme. -Labs done 06/14/2019 showed hemoglobin has increased to 12.8 and ferritin 84% saturation 30. -She will follow-up in 2 months with repeat labs.  2.  B12 deficiency: -B12 on 03/24/2019 was 145. -Repeat B12 on 04/19/2019 was normal. -Labs done on 06/14/2019 B12 was 499. -She will continue taking B12 1 mg daily.  3.  Dizziness: -She reported dizziness when her head is in a certain position while in the bed.  She reports her room is spinning around her.  She denies any associated nausea or vomiting.  Denies any recent upper respiratory infections.  She also has occasional dizziness when she stands up suddenly. -Her blood pressure today is 113/77. -On her last visit she was given meclizine 25 mg that she can take 3 times a day as needed to see if it helps on her. -If she has no improvement will consider imaging.      Orders placed this encounter:  Orders Placed This Encounter  Procedures  . Lactate dehydrogenase  . CBC with Differential/Platelet  . Comprehensive metabolic panel  . Ferritin  . Iron and TIBC  . Vitamin B12  . VITAMIN D 25 Hydroxy (Vit-D Deficiency, Fractures)  . Folate      Francene Finders, FNP-C [place] [phone]

## 2019-06-17 NOTE — Assessment & Plan Note (Signed)
1.  Severe iron deficiency anemia: -Admitted on 03/24/2019 with hemoglobin 5.3 and ferritin of 6 and B12 of 145.  Status post 3 units of PRBC and 1 infusion of Feraheme on 03/24/2019. -Colonoscopy on 11/17/2018 showed 2 diminutive polyps in the transverse colon, diverticulosis in the sigmoid colon and descending colon and transverse colon. -EGD showed duodenal erosions. -Stool for occult blood was negative x3. -Work-up including: SPEP was negative, B12 was normal, copper was slightly low at 67.  Folic acid was normal.  Hemoglobin 11.2 and ferritin 32.  She was given 2 infusions of Feraheme. -Labs done 06/14/2019 showed hemoglobin has increased to 12.8 and ferritin 84% saturation 30. -She will follow-up in 2 months with repeat labs.  2.  B12 deficiency: -B12 on 03/24/2019 was 145. -Repeat B12 on 04/19/2019 was normal. -Labs done on 06/14/2019 B12 was 499. -She will continue taking B12 1 mg daily.  3.  Dizziness: -She reported dizziness when her head is in a certain position while in the bed.  She reports her room is spinning around her.  She denies any associated nausea or vomiting.  Denies any recent upper respiratory infections.  She also has occasional dizziness when she stands up suddenly. -Her blood pressure today is 113/77. -On her last visit she was given meclizine 25 mg that she can take 3 times a day as needed to see if it helps on her. -If she has no improvement will consider imaging.

## 2019-06-17 NOTE — Patient Instructions (Signed)
[facility] Discharge Instructions  Follow up in 2 months with labs    Thank you for choosing [facility] to provide your oncology and hematology care.  To afford each patient quality time with our provider, please arrive at least 15 minutes before your scheduled appointment time.   If you have a lab appointment with the Cancer Center please come in thru the Main Entrance and check in at the main information desk.  You need to re-schedule your appointment should you arrive 10 or more minutes late.  We strive to give you quality time with our providers, and arriving late affects you and other patients whose appointments are after yours.  Also, if you no show three or more times for appointments you may be dismissed from the clinic at the providers discretion.     Again, thank you for choosing [facility].  Our hope is that these requests will decrease the amount of time that you wait before being seen by our physicians.       _____________________________________________________________  Should you have questions after your visit to  Cancer Center, please contact our office at [phone] between the hours of 8:00 a.m. and 4:30 p.m.  Voicemails left after 4:00 p.m. will not be returned until the following business day.  For prescription refill requests, have your pharmacy contact our office and allow 72 hours.    Due to Covid, you will need to wear a mask upon entering the hospital. If you do not have a mask, a mask will be given to you at the Main Entrance upon arrival. For doctor visits, patients may have 1 support person with them. For treatment visits, patients can not have anyone with them due to social distancing guidelines and our immunocompromised population.      

## 2019-07-02 ENCOUNTER — Ambulatory Visit: Payer: BC Managed Care – PPO | Admitting: Cardiology

## 2019-07-05 ENCOUNTER — Other Ambulatory Visit: Payer: Self-pay | Admitting: Family Medicine

## 2019-07-05 DIAGNOSIS — R0609 Other forms of dyspnea: Secondary | ICD-10-CM | POA: Insufficient documentation

## 2019-07-05 DIAGNOSIS — Z7189 Other specified counseling: Secondary | ICD-10-CM | POA: Insufficient documentation

## 2019-07-05 DIAGNOSIS — R0600 Dyspnea, unspecified: Secondary | ICD-10-CM | POA: Insufficient documentation

## 2019-07-05 DIAGNOSIS — Z1231 Encounter for screening mammogram for malignant neoplasm of breast: Secondary | ICD-10-CM

## 2019-07-05 DIAGNOSIS — R0602 Shortness of breath: Secondary | ICD-10-CM | POA: Insufficient documentation

## 2019-07-05 NOTE — Progress Notes (Signed)
Cardiology Office Note   Date:  07/07/2019   ID:  Stacey Reyes, DOB 1945/05/14, MRN [id number] PCP:  [name], DO  Cardiologist:   Minus Breeding, MD   Chief Complaint  Patient presents with  . Heart Murmur      History of Present Illness: Stacey Reyes is a 74 y.o. female who is referred by [name], DO for evaluation of a heart murmur .  She saw Dr. Pernell Dupre for evaluation of palpitations.  She was seen in 2017 with SVT or afib suspected but not detected on event monitor.   Echo EF was normal in 2017.  I saw her with a murmur that was thought to be more prominent.   Echo EF was 75% with severe LVH.   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  She was admitted to the hospital after her last visit here.  She was found to have profound anemia.  I reviewed these records for this visit.  She was found to have severe B12 deficiency.  She required transfusions.    She has been seen by a hematologist and I did review their notes.  Etiology of anemia is not entirely clear but I do not see any suggestion of bone marrow involvement or multiple myeloma.  I do see some work-up.  She actually is no longer anemic and feels much better.  She is much less short of breath than she was.  She is not having any palpitations, presyncope or syncope.  She has had no new chest pressure, neck or arm discomfort.  She has had no weight gain or edema.  Of note I did send her for a PYP scan.  I had wanted to get an MRI but cost was prohibitive at that time.  Past Medical History:  Diagnosis Date  . Anemia   . Cataract    removed bilateral  . Chronic cystitis   . Heart murmur   . Hyperlipidemia   . Hypertension   . Personal history of colonic polyps-adenomas 01/07/2012   2009 - 2 diminutive adenomas (prior polyps also) 01/07/2012 - 2 diminutive adenomas      Past Surgical History:  Procedure Laterality Date  . BREAST BIOPSY Right    No Scar seen   . COLONOSCOPY  multiple      Current Outpatient Medications  Medication Sig Dispense Refill  . bisoprolol (ZEBETA) 5 MG tablet Take 0.5 tablets (2.5 mg total) by mouth daily. 45 tablet 3  . cyanocobalamin ([facility] VITAMIN B12) 2000 MCG tablet Take 1 tablet (2,000 mcg total) by mouth daily.    Marland Kitchen lisinopril (ZESTRIL) 5 MG tablet Take 1 tablet (5 mg total) by mouth daily. 90 tablet 3  . Magnesium 200 MG TABS Take 1 tablet (200 mg total) by mouth daily. 30 tablet 6  . meclizine (ANTIVERT) 25 MG tablet Take 1 tablet (25 mg total) by mouth 3 (three) times daily as needed for dizziness. 30 tablet 1  . Multiple Vitamin (MULTIVITAMIN) capsule Take 1 capsule by mouth daily.    . rosuvastatin (CRESTOR) 10 MG tablet TAKE 1 TABLET BY MOUTH AT  BEDTIME 90 tablet 3  . Rosuvastatin Calcium 10 MG CPSP Take 10 mg by mouth at bedtime. 90 capsule 3  . thiamine (VITAMIN B-1) 100 MG tablet Take 100 mg by mouth daily.    . vitamin E 400 UNIT capsule Take 400 Units by mouth daily.      No current facility-administered medications for this  visit.    Allergies:   Sulfa antibiotics    ROS:  Please see the history of present illness.   Otherwise, review of systems are positive for none.   All other systems are reviewed and negative.    PHYSICAL EXAM: VS:  BP (!) 144/78   Pulse 78   Temp (!) 97.2 F (36.2 C)   Ht 5' 5.5" (1.664 m)   Wt 199 lb 3.2 oz (90.4 kg)   SpO2 96%   BMI 32.64 kg/m  , BMI Body mass index is 32.64 kg/m. GENERAL:  Well appearing NECK:  No jugular venous distention, waveform within normal limits, carotid upstroke brisk and symmetric, no bruits, no thyromegaly LUNGS:  Clear to auscultation bilaterally CHEST:  Unremarkable HEART:  PMI not displaced or sustained,S1 and S2 within normal limits, no S3, no S4, no clicks, no rubs, 3 out of 6 apical systolic murmur radiating at the aortic outflow tract, increased with a strain phase of Valsalva, no diastolic murmurs ABD:  Flat, positive bowel sounds normal in frequency  in pitch, no bruits, no rebound, no guarding, no midline pulsatile mass, no hepatomegaly, no splenomegaly EXT:  2 plus pulses throughout, no edema, no cyanosis no clubbing    EKG:  EKG is not ordered today.    Recent Labs: 03/23/2019: Magnesium 2.1; TSH 1.020 03/24/2019: B Natriuretic Peptide 462.0 06/14/2019: ALT 20; BUN 10; Creatinine, Ser 0.59; Hemoglobin 12.8; Platelets 213; Potassium 3.8; Sodium 138    Lipid Panel    Component Value Date/Time   CHOL 185 12/05/2011 0000   TRIG 187 (A) 12/05/2011 0000   HDL 44 12/05/2011 0000   LDLCALC 110 12/05/2011 0000      Wt Readings from Last 3 Encounters:  07/07/19 199 lb 3.2 oz (90.4 kg)  06/17/19 197 lb 12.8 oz (89.7 kg)  05/05/19 192 lb (87.1 kg)      Other studies Reviewed: Additional studies/ records that were reviewed today include:   PYP scan and hematology notes Review of the above records demonstrates:    Please see elsewhere in the note.     ASSESSMENT AND PLAN:   Dyspnea:     This is improved.  No further change in therapy.  LVH/HCM:    I do still strongly suspect amyloid though the PYP scan was equivocal.  She now consents to have an MRI.  Hypertension:  Her blood pressure is slightly elevated.  However, she recently had her blood pressure medicine reduced because of some lower readings.  I will make no change.   Covid education:   She has had both vaccines.   Current medicines are reviewed at length with the patient today.  The patient does not have concerns regarding medicines.  The following changes have been made:  None  Labs/ tests ordered today include:   Orders Placed This Encounter  Procedures  . MR Card Morphology W/O Cm     Disposition:   FU with me in six months.    Signed, Minus Breeding, MD  07/07/2019 2:16 PM    [facility]

## 2019-07-06 ENCOUNTER — Ambulatory Visit
Admission: RE | Admit: 2019-07-06 | Discharge: 2019-07-06 | Disposition: A | Discharge status: Home / Self Care | Payer: BC Managed Care – PPO | Source: Ambulatory Visit | Attending: Family Medicine | Admitting: Family Medicine

## 2019-07-06 ENCOUNTER — Other Ambulatory Visit: Payer: Self-pay

## 2019-07-06 DIAGNOSIS — Z1231 Encounter for screening mammogram for malignant neoplasm of breast: Secondary | ICD-10-CM

## 2019-07-07 ENCOUNTER — Encounter: Payer: Self-pay | Admitting: Cardiology

## 2019-07-07 ENCOUNTER — Other Ambulatory Visit: Payer: Self-pay

## 2019-07-07 ENCOUNTER — Ambulatory Visit: Payer: BC Managed Care – PPO | Admitting: Cardiology

## 2019-07-07 VITALS — BP 144/78 | HR 78 | Temp 97.2°F | Ht 65.5 in | Wt 199.2 lb

## 2019-07-07 DIAGNOSIS — I517 Cardiomegaly: Secondary | ICD-10-CM

## 2019-07-07 DIAGNOSIS — R0602 Shortness of breath: Secondary | ICD-10-CM | POA: Diagnosis not present

## 2019-07-07 DIAGNOSIS — I10 Essential (primary) hypertension: Secondary | ICD-10-CM

## 2019-07-07 DIAGNOSIS — Z7189 Other specified counseling: Secondary | ICD-10-CM | POA: Diagnosis not present

## 2019-07-07 NOTE — Patient Instructions (Signed)
Medication Instructions:  No changes *If you need a refill on your cardiac medications before your next appointment, please call your pharmacy*  Lab Work: None ordered this visit  Testing/Procedures: Your physician has requested that you have a cardiac MRI. Cardiac MRI uses a computer to create images of your heart as its beating, producing both still and moving pictures of your heart and major blood vessels. For further information please visit [email or web address] Please follow the instruction sheet given to you today for more information.  Follow-Up: At [facility], you and your health needs are our priority.  As part of our continuing mission to provide you with exceptional heart care, we have created designated Provider Care Teams.  These Care Teams include your primary Cardiologist (physician) and Advanced Practice Providers (APPs -  Physician Assistants and Nurse Practitioners) who all work together to provide you with the care you need, when you need it.   Your next appointment:   6 month(s)  You will receive a reminder letter in the mail two months in advance. If you don't receive a letter, please call our office to schedule the follow-up appointment.  The format for your next appointment:   In Person  Provider:   Minus Breeding, MD

## 2019-07-14 ENCOUNTER — Telehealth: Payer: Self-pay | Admitting: Cardiology

## 2019-07-14 ENCOUNTER — Encounter: Payer: Self-pay | Admitting: Cardiology

## 2019-07-14 NOTE — Telephone Encounter (Signed)
Spoke with patient regarding appointment for Cardiac MRI scheduled Friday 08/13/19 at 8:00 am at [facility]---arrival time is 7:15 am 1st floor admissions office.  Will mail information to patient and it is also in My Chart

## 2019-08-12 ENCOUNTER — Telehealth (HOSPITAL_COMMUNITY): Payer: Self-pay | Admitting: *Deleted

## 2019-08-12 NOTE — Telephone Encounter (Signed)
Reaching out to patient to offer assistance regarding upcoming cardiac imaging study; pt verbalizes understanding of appt date/time, parking situation and where to check in, and verified current allergies; name and call back number provided for further questions should they arise  Neil Brickell Tai RN Navigator Cardiac Imaging [facility] [phone] office [phone] cell 

## 2019-08-13 ENCOUNTER — Other Ambulatory Visit: Payer: Self-pay

## 2019-08-13 ENCOUNTER — Ambulatory Visit (HOSPITAL_COMMUNITY)
Admission: RE | Admit: 2019-08-13 | Discharge: 2019-08-13 | Disposition: A | Discharge status: Home / Self Care | Payer: BC Managed Care – PPO | Source: Ambulatory Visit | Attending: Cardiology | Admitting: Cardiology

## 2019-08-13 DIAGNOSIS — I517 Cardiomegaly: Secondary | ICD-10-CM | POA: Diagnosis not present

## 2019-08-13 MED ORDER — GADOBUTROL 1 MMOL/ML IV SOLN
10.0000 mL | Freq: Once | INTRAVENOUS | Status: AC | PRN
  Administered 2019-08-13: 10 mL via INTRAVENOUS

## 2019-08-18 DIAGNOSIS — L814 Other melanin hyperpigmentation: Secondary | ICD-10-CM | POA: Diagnosis not present

## 2019-08-18 DIAGNOSIS — D225 Melanocytic nevi of trunk: Secondary | ICD-10-CM | POA: Diagnosis not present

## 2019-08-18 DIAGNOSIS — L905 Scar conditions and fibrosis of skin: Secondary | ICD-10-CM | POA: Diagnosis not present

## 2019-08-18 DIAGNOSIS — L821 Other seborrheic keratosis: Secondary | ICD-10-CM | POA: Diagnosis not present

## 2019-08-19 ENCOUNTER — Other Ambulatory Visit: Payer: Self-pay

## 2019-08-19 ENCOUNTER — Inpatient Hospital Stay (HOSPITAL_COMMUNITY): Payer: BC Managed Care – PPO | Attending: Hematology

## 2019-08-19 DIAGNOSIS — E538 Deficiency of other specified B group vitamins: Secondary | ICD-10-CM | POA: Insufficient documentation

## 2019-08-19 DIAGNOSIS — D649 Anemia, unspecified: Secondary | ICD-10-CM

## 2019-08-19 DIAGNOSIS — Z800 Family history of malignant neoplasm of digestive organs: Secondary | ICD-10-CM | POA: Insufficient documentation

## 2019-08-19 DIAGNOSIS — Z87891 Personal history of nicotine dependence: Secondary | ICD-10-CM | POA: Insufficient documentation

## 2019-08-19 DIAGNOSIS — Z86010 Personal history of colonic polyps: Secondary | ICD-10-CM | POA: Insufficient documentation

## 2019-08-19 DIAGNOSIS — R790 Abnormal level of blood mineral: Secondary | ICD-10-CM | POA: Insufficient documentation

## 2019-08-19 DIAGNOSIS — Z79899 Other long term (current) drug therapy: Secondary | ICD-10-CM | POA: Insufficient documentation

## 2019-08-19 DIAGNOSIS — Z803 Family history of malignant neoplasm of breast: Secondary | ICD-10-CM | POA: Insufficient documentation

## 2019-08-19 DIAGNOSIS — K5790 Diverticulosis of intestine, part unspecified, without perforation or abscess without bleeding: Secondary | ICD-10-CM | POA: Diagnosis not present

## 2019-08-19 DIAGNOSIS — D509 Iron deficiency anemia, unspecified: Secondary | ICD-10-CM | POA: Diagnosis not present

## 2019-08-19 DIAGNOSIS — R42 Dizziness and giddiness: Secondary | ICD-10-CM | POA: Insufficient documentation

## 2019-08-19 DIAGNOSIS — K269 Duodenal ulcer, unspecified as acute or chronic, without hemorrhage or perforation: Secondary | ICD-10-CM | POA: Insufficient documentation

## 2019-08-19 LAB — COMPREHENSIVE METABOLIC PANEL
ALT: 22 U/L (ref 0–44)
AST: 27 U/L (ref 15–41)
Albumin: 4.2 g/dL (ref 3.5–5.0)
Alkaline Phosphatase: 44 U/L (ref 38–126)
Anion gap: 9 (ref 5–15)
BUN: 11 mg/dL (ref 8–23)
CO2: 25 mmol/L (ref 22–32)
Calcium: 8.9 mg/dL (ref 8.9–10.3)
Chloride: 102 mmol/L (ref 98–111)
Creatinine, Ser: 0.71 mg/dL (ref 0.44–1.00)
GFR calc Af Amer: >60 mL/min (ref >60)
GFR calc non Af Amer: >60 mL/min (ref >60)
Glucose, Bld: 94 mg/dL (ref 70–99)
Potassium: 3.7 mmol/L (ref 3.5–5.1)
Sodium: 136 mmol/L (ref 135–145)
Total Bilirubin: 0.8 mg/dL (ref 0.3–1.2)
Total Protein: 6.9 g/dL (ref 6.5–8.1)

## 2019-08-19 LAB — CBC WITH DIFFERENTIAL/PLATELET
Abs Immature Granulocytes: 0.02 10*3/uL (ref 0.00–0.07)
Basophils Absolute: 0.1 10*3/uL (ref 0.0–0.1)
Basophils Relative: 1 %
Eosinophils Absolute: 0.3 10*3/uL (ref 0.0–0.5)
Eosinophils Relative: 3 %
HCT: 40.9 % (ref 36.0–46.0)
Hemoglobin: 13.5 g/dL (ref 12.0–15.0)
Immature Granulocytes: 0 %
Lymphocytes Relative: 33 %
Lymphs Abs: 2.7 10*3/uL (ref 0.7–4.0)
MCH: 30.3 pg (ref 26.0–34.0)
MCHC: 33 g/dL (ref 30.0–36.0)
MCV: 91.7 fL (ref 80.0–100.0)
Monocytes Absolute: 0.8 10*3/uL (ref 0.1–1.0)
Monocytes Relative: 10 %
Neutro Abs: 4.2 10*3/uL (ref 1.7–7.7)
Neutrophils Relative %: 53 %
Platelets: 224 10*3/uL (ref 150–400)
RBC: 4.46 MIL/uL (ref 3.87–5.11)
RDW: 12.8 % (ref 11.5–15.5)
WBC: 8 10*3/uL (ref 4.0–10.5)
nRBC: 0 % (ref 0.0–0.2)

## 2019-08-19 LAB — FOLATE: Folate: 9.2 ng/mL (ref >5.9)

## 2019-08-19 LAB — IRON AND TIBC
Iron: 100 ug/dL (ref 28–170)
Saturation Ratios: 26 % (ref 10.4–31.8)
TIBC: 378 ug/dL (ref 250–450)
UIBC: 278 ug/dL

## 2019-08-19 LAB — LACTATE DEHYDROGENASE: LDH: 169 U/L (ref 98–192)

## 2019-08-19 LAB — VITAMIN B12: Vitamin B-12: 564 pg/mL (ref 180–914)

## 2019-08-19 LAB — VITAMIN D 25 HYDROXY (VIT D DEFICIENCY, FRACTURES): Vit D, 25-Hydroxy: 19.8 ng/mL — ABNORMAL LOW (ref 30–100)

## 2019-08-19 LAB — FERRITIN: Ferritin: 48 ng/mL (ref 11–307)

## 2019-08-26 ENCOUNTER — Encounter (HOSPITAL_COMMUNITY): Payer: Self-pay | Admitting: Nurse Practitioner

## 2019-08-26 ENCOUNTER — Inpatient Hospital Stay (HOSPITAL_COMMUNITY): Payer: BC Managed Care – PPO | Attending: Hematology | Admitting: Nurse Practitioner

## 2019-08-26 ENCOUNTER — Other Ambulatory Visit: Payer: Self-pay

## 2019-08-26 VITALS — BP 126/75 | HR 78 | Temp 97.1°F | Resp 19 | Wt 202.4 lb

## 2019-08-26 DIAGNOSIS — E559 Vitamin D deficiency, unspecified: Secondary | ICD-10-CM | POA: Diagnosis not present

## 2019-08-26 DIAGNOSIS — D509 Iron deficiency anemia, unspecified: Secondary | ICD-10-CM | POA: Diagnosis not present

## 2019-08-26 DIAGNOSIS — D123 Benign neoplasm of transverse colon: Secondary | ICD-10-CM | POA: Diagnosis not present

## 2019-08-26 DIAGNOSIS — D649 Anemia, unspecified: Secondary | ICD-10-CM

## 2019-08-26 DIAGNOSIS — D124 Benign neoplasm of descending colon: Secondary | ICD-10-CM | POA: Diagnosis not present

## 2019-08-26 MED ORDER — ERGOCALCIFEROL 1.25 MG (50000 UT) PO CAPS: 50000.0000 [IU] | ORAL_CAPSULE | ORAL | 3 refills | Status: DC

## 2019-08-26 NOTE — Progress Notes (Signed)
[facility] [place], [place] [address]   CLINIC:  Medical Oncology/Hematology  PCP:  [name], DO [place] [address] [phone]   REASON FOR VISIT: Follow-up for iron deficiency anemia  CURRENT THERAPY: Intermittent iron infusions   INTERVAL HISTORY:  Stacey Reyes 74 y.o. female returns for routine follow-up for iron deficiency anemia.  Patient reports she is doing well since her last visit.  She denies any bright red bleeding per rectum or melena.  She denies any easy bruising or bleeding.  She does report she has more fatigue in the evenings. Denies any nausea, vomiting, or diarrhea. Denies any new pains. Had not noticed any recent bleeding such as epistaxis, hematuria or hematochezia. Denies recent chest pain on exertion, shortness of breath on minimal exertion, pre-syncopal episodes, or palpitations. Denies any numbness or tingling in hands or feet. Denies any recent fevers, infections, or recent hospitalizations. Patient reports appetite at 100% and energy level at 50%.  She is eating well maintain her weight at this time.     REVIEW OF SYSTEMS:  Review of Systems  Constitutional: Positive for fatigue.  Respiratory: Positive for shortness of breath.   Neurological: Positive for headaches.  Psychiatric/Behavioral: Positive for sleep disturbance.  All other systems reviewed and are negative.    PAST MEDICAL/SURGICAL HISTORY:  Past Medical History:  Diagnosis Date   Anemia    Cataract    removed bilateral   Chronic cystitis    Heart murmur    Hyperlipidemia    Hypertension    Personal history of colonic polyps-adenomas 01/07/2012   2009 - 2 diminutive adenomas (prior polyps also) 01/07/2012 - 2 diminutive adenomas     Past Surgical History:  Procedure Laterality Date   BREAST BIOPSY Right    No Scar seen    COLONOSCOPY  multiple     SOCIAL HISTORY:  Social History   Socioeconomic History    Marital status: Married    Spouse name: Not on file   Number of children: 1   Years of education: Not on file   Highest education level: Not on file  Occupational History   Occupation: retired  Tobacco Use   Smoking status: Former Smoker    Quit date: 12/23/1984    Years since quitting: 34.6   Smokeless tobacco: Never Used  Substance and Sexual Activity   Alcohol use: Yes    Comment: rare   Drug use: No   Sexual activity: Not on file  Other Topics Concern   Not on file  Social History Narrative   Patient is married and retired and has 1 grown child   Social Determinants of Radio broadcast assistant Strain:    Difficulty of Paying Living Expenses:   Food Insecurity:    Worried About Charity fundraiser in the Last Year:    Arboriculturist in the Last Year:   Transportation Needs:    Film/video editor (Medical):    Lack of Transportation (Non-Medical):   Physical Activity:    Days of Exercise per Week:    Minutes of Exercise per Session:   Stress:    Feeling of Stress :   Social Connections:    Frequency of Communication with Friends and Family:    Frequency of Social Gatherings with Friends and Family:    Attends Religious Services:    Active Member of Clubs or Organizations:    Attends Archivist Meetings:  Marital Status:   Intimate Partner Violence:    Fear of Current or Ex-Partner:    Emotionally Abused:    Physically Abused:    Sexually Abused:     FAMILY HISTORY:  Family History  Problem Relation Age of Onset   Colon cancer Mother 76       80's   Breast cancer Sister    Asthma Brother    Colon polyps Neg Hx    Esophageal cancer Neg Hx    Rectal cancer Neg Hx    Stomach cancer Neg Hx     CURRENT MEDICATIONS:  Outpatient Encounter Medications as of 08/26/2019  Medication Sig   bisoprolol (ZEBETA) 5 MG tablet Take 0.5 tablets (2.5 mg total) by mouth daily.   cyanocobalamin ([employer] VITAMIN B12) 2000  MCG tablet Take 1 tablet (2,000 mcg total) by mouth daily.   lisinopril (ZESTRIL) 5 MG tablet Take 1 tablet (5 mg total) by mouth daily.   Magnesium 200 MG TABS Take 1 tablet (200 mg total) by mouth daily.   Multiple Vitamin (MULTIVITAMIN) capsule Take 1 capsule by mouth daily.   rosuvastatin (CRESTOR) 10 MG tablet TAKE 1 TABLET BY MOUTH AT  BEDTIME   thiamine (VITAMIN B-1) 100 MG tablet Take 100 mg by mouth daily.   vitamin E 400 UNIT capsule Take 400 Units by mouth daily.    [DISCONTINUED] Rosuvastatin Calcium 10 MG CPSP Take 10 mg by mouth at bedtime.   meclizine (ANTIVERT) 25 MG tablet Take 1 tablet (25 mg total) by mouth 3 (three) times daily as needed for dizziness. (Patient not taking: Reported on 08/26/2019)   No facility-administered encounter medications on file as of 08/26/2019.    ALLERGIES:  Allergies  Allergen Reactions   Sulfa Antibiotics Rash     PHYSICAL EXAM:  ECOG Performance status: 1  Vitals:   08/26/19 1307  BP: 126/75  Pulse: 78  Resp: 19  Temp: (!) 97.1 F (36.2 C)  SpO2: 97%   Filed Weights   08/26/19 1307  Weight: 202 lb 6.4 oz (91.8 kg)     Physical Exam Constitutional:      Appearance: Normal appearance. She is normal weight.  Musculoskeletal:        General: Normal range of motion.  Skin:    General: Skin is warm.  Neurological:     Mental Status: She is alert and oriented to person, place, and time. Mental status is at baseline.  Psychiatric:        Mood and Affect: Mood normal.        Behavior: Behavior normal.        Thought Content: Thought content normal.        Judgment: Judgment normal.      LABORATORY DATA:  I have reviewed the labs as listed.  CBC    Component Value Date/Time   WBC 8.0 08/19/2019 1417   RBC 4.46 08/19/2019 1417   HGB 13.5 08/19/2019 1417   HGB 10.0 (L) 03/31/2019 1044   HCT 40.9 08/19/2019 1417   HCT 32.9 (L) 03/31/2019 1044   PLT 224 08/19/2019 1417   PLT 214 03/31/2019 1044   MCV 91.7  08/19/2019 1417   MCV 82 03/31/2019 1044   MCH 30.3 08/19/2019 1417   MCHC 33.0 08/19/2019 1417   RDW 12.8 08/19/2019 1417   RDW 21.8 (H) 03/31/2019 1044   LYMPHSABS 2.7 08/19/2019 1417   LYMPHSABS 1.8 03/02/2018 1351   MONOABS 0.8 08/19/2019 1417   EOSABS 0.3 08/19/2019 1417  EOSABS 0.2 03/02/2018 1351   BASOSABS 0.1 08/19/2019 1417   BASOSABS 0.1 03/02/2018 1351   CMP Latest Ref Rng & Units 08/19/2019 06/14/2019 05/06/2019  Glucose 70 - 99 mg/dL 94 100(H) 80  BUN 8 - 23 mg/dL 11 10 8   Creatinine 0.44 - 1.00 mg/dL 0.71 0.59 0.71  Sodium 135 - 145 mmol/L 136 138 138  Potassium 3.5 - 5.1 mmol/L 3.7 3.8 4.5  Chloride 98 - 111 mmol/L 102 101 101  CO2 22 - 32 mmol/L 25 26 25   Calcium 8.9 - 10.3 mg/dL 8.9 9.4 9.3  Total Protein 6.5 - 8.1 g/dL 6.9 6.8 -  Total Bilirubin 0.3 - 1.2 mg/dL 0.8 0.9 -  Alkaline Phos 38 - 126 U/L 44 43 -  AST 15 - 41 U/L 27 27 -  ALT 0 - 44 U/L 22 20 -    All questions were answered to patient's stated satisfaction. Encouraged patient to call with any new concerns or questions before his next visit to the cancer center and we can certain see him sooner, if needed.    ASSESSMENT & PLAN:  Symptomatic anemia 1.  Severe iron deficiency anemia: -Admitted on 03/24/2019 with hemoglobin 5.3 and ferritin of 6 and B12 of 145.  Status post 3 units of PRBC and 1 infusion of Feraheme on 03/24/2019. -Colonoscopy on 11/17/2018 showed 2 diminutive polyps in the transverse colon, diverticulosis in the sigmoid colon and descending colon and transverse colon. -EGD showed duodenal erosions. -Stool for occult blood was negative x3. -Work-up including: SPEP was negative, B12 was normal, copper was slightly low at 67.  Folic acid was normal.  Hemoglobin 11.2 and ferritin 32.  She was given 2 infusions of Feraheme. -Labs done on 08/19/2019 showed hemoglobin 13.5, ferritin 48, percent saturation 26 -Due to her severe fatigue and drop in ferritin we will go ahead and give her 2 more  infusions of IV iron. -She will follow-up in 5 months with repeat labs.  2.  B12 deficiency: -B12 on 03/24/2019 was 145. -Labs done on 08/19/2019 showed B12 564 -She will continue taking B12 1 mg daily.  3.  Dizziness: -She reported dizziness when her head is in a certain position while in the bed.  She reports her room is spinning around her.  She denies any associated nausea or vomiting.  Denies any recent upper respiratory infections.  She also has occasional dizziness when she stands up suddenly. -Her blood pressure today is 113/77. -On her last visit she was given meclizine 25 mg that she can take 3 times a day as needed to see if it helps on her. -If she has no improvement will consider imaging. -This has resolved.  4.  Vitamin D deficiency: -Labs done on 08/19/2019 showed vitamin D level 19.80 -We will prescribe her 50,000 units weekly. -We will check on her next visit.     Orders placed this encounter:  Orders Placed This Encounter  Procedures   Lactate dehydrogenase   CBC with Differential/Platelet   Comprehensive metabolic panel   Ferritin   Iron and TIBC   Vitamin B12   VITAMIN D 25 Hydroxy (Vit-D Deficiency, Fractures)   Folate      Stacey Finders, FNP-C [place] [phone]

## 2019-08-26 NOTE — Assessment & Plan Note (Addendum)
1.  Severe iron deficiency anemia: -Admitted on 03/24/2019 with hemoglobin 5.3 and ferritin of 6 and B12 of 145.  Status post 3 units of PRBC and 1 infusion of Feraheme on 03/24/2019. -Colonoscopy on 11/17/2018 showed 2 diminutive polyps in the transverse colon, diverticulosis in the sigmoid colon and descending colon and transverse colon. -EGD showed duodenal erosions. -Stool for occult blood was negative x3. -Work-up including: SPEP was negative, B12 was normal, copper was slightly low at 67.  Folic acid was normal.  Hemoglobin 11.2 and ferritin 32.  She was given 2 infusions of Feraheme. -Labs done on 08/19/2019 showed hemoglobin 13.5, ferritin 48, percent saturation 26 -Due to her severe fatigue and drop in ferritin we will go ahead and give her 2 more infusions of IV iron. -She will follow-up in 5 months with repeat labs.  2.  B12 deficiency: -B12 on 03/24/2019 was 145. -Labs done on 08/19/2019 showed B12 564 -She will continue taking B12 1 mg daily.  3.  Dizziness: -She reported dizziness when her head is in a certain position while in the bed.  She reports her room is spinning around her.  She denies any associated nausea or vomiting.  Denies any recent upper respiratory infections.  She also has occasional dizziness when she stands up suddenly. -Her blood pressure today is 113/77. -On her last visit she was given meclizine 25 mg that she can take 3 times a day as needed to see if it helps on her. -If she has no improvement will consider imaging. -This has resolved.  4.  Vitamin D deficiency: -Labs done on 08/19/2019 showed vitamin D level 19.80 -We will prescribe her 50,000 units weekly. -We will check on her next visit.

## 2019-08-26 NOTE — Patient Instructions (Signed)
[place] at [facility] Discharge Instructions  [place] at [facility] Discharge Instructions  Follow up in 5 months with labs    Thank you for choosing [place] at [facility] to provide your oncology and hematology care.  To afford each patient quality time with our provider, please arrive at least 15 minutes before your scheduled appointment time.   If you have a lab appointment with the [facility] please come in thru the Main Entrance and check in at the main information desk.  You need to re-schedule your appointment should you arrive 10 or more minutes late.  We strive to give you quality time with our providers, and arriving late affects you and other patients whose appointments are after yours.  Also, if you no show three or more times for appointments you may be dismissed from the clinic at the providers discretion.     Again, thank you for choosing [facility].  Our hope is that these requests will decrease the amount of time that you wait before being seen by our physicians.       _____________________________________________________________  Should you have questions after your visit to [facility], please contact our office at [phone] between the hours of 8:00 a.m. and 4:30 p.m.  Voicemails left after 4:00 p.m. will not be returned until the following business day.  For prescription refill requests, have your pharmacy contact our office and allow 72 hours.    Due to Covid, you will need to wear a mask upon entering the hospital. If you do not have a mask, a mask will be given to you at the Main Entrance upon arrival. For doctor visits, patients may have 1 support person with them. For treatment visits, patients can not have anyone with them due to social distancing guidelines and our immunocompromised population.        Thank you for choosing [place]  at [facility] to provide your oncology and hematology care.  To afford each patient quality time with our provider, please arrive at least 15 minutes before your scheduled appointment time.   If you have a lab appointment with the [facility] please come in thru the Main Entrance and check in at the main information desk.  You need to re-schedule your appointment should you arrive 10 or more minutes late.  We strive to give you quality time with our providers, and arriving late affects you and other patients whose appointments are after yours.  Also, if you no show three or more times for appointments you may be dismissed from the clinic at the providers discretion.     Again, thank you for choosing [facility].  Our hope is that these requests will decrease the amount of time that you wait before being seen by our physicians.       _____________________________________________________________  Should you have questions after your visit to [facility], please contact our office at [phone] between the hours of 8:00 a.m. and 4:30 p.m.  Voicemails left after 4:00 p.m. will not be returned until the following business day.  For prescription refill requests, have your pharmacy contact our office and allow 72 hours.    Due to Covid, you will need to wear a mask upon entering the hospital. If you do not have a mask, a mask will be given to you at the  Main Entrance upon arrival. For doctor visits, patients may have 1 support person with them. For treatment visits, patients can not have anyone with them due to social distancing guidelines and our immunocompromised population.

## 2019-09-07 ENCOUNTER — Inpatient Hospital Stay (HOSPITAL_COMMUNITY): Payer: BC Managed Care – PPO

## 2019-09-07 ENCOUNTER — Encounter (HOSPITAL_COMMUNITY): Payer: Self-pay

## 2019-09-07 VITALS — BP 143/66 | HR 58 | Temp 97.7°F | Resp 18

## 2019-09-07 DIAGNOSIS — D509 Iron deficiency anemia, unspecified: Secondary | ICD-10-CM | POA: Diagnosis not present

## 2019-09-07 DIAGNOSIS — D649 Anemia, unspecified: Secondary | ICD-10-CM

## 2019-09-07 DIAGNOSIS — D123 Benign neoplasm of transverse colon: Secondary | ICD-10-CM | POA: Diagnosis not present

## 2019-09-07 DIAGNOSIS — D124 Benign neoplasm of descending colon: Secondary | ICD-10-CM | POA: Diagnosis not present

## 2019-09-07 MED ORDER — SODIUM CHLORIDE 0.9 % IV SOLN: Freq: Once | INTRAVENOUS | Status: AC

## 2019-09-07 MED ORDER — SODIUM CHLORIDE 0.9 % IV SOLN
510.0000 mg | Freq: Once | INTRAVENOUS | Status: AC
  Administered 2019-09-07: 510 mg via INTRAVENOUS
  Filled 2019-09-07: qty 17

## 2019-09-07 NOTE — Progress Notes (Signed)
Patient tolerated iron infusion with no complaints voiced.  Peripheral IV site clean and dry with good blood return noted before and after infusion.  Band aid applied.  VSS with discharge and left ambulatory with no s/s of distress noted.  

## 2019-09-14 ENCOUNTER — Encounter (HOSPITAL_COMMUNITY): Payer: Self-pay

## 2019-09-14 ENCOUNTER — Inpatient Hospital Stay (HOSPITAL_COMMUNITY): Payer: BC Managed Care – PPO

## 2019-09-14 ENCOUNTER — Other Ambulatory Visit: Payer: Self-pay

## 2019-09-14 VITALS — BP 115/65 | HR 61 | Temp 97.9°F | Resp 18

## 2019-09-14 DIAGNOSIS — D124 Benign neoplasm of descending colon: Secondary | ICD-10-CM | POA: Diagnosis not present

## 2019-09-14 DIAGNOSIS — D649 Anemia, unspecified: Secondary | ICD-10-CM

## 2019-09-14 DIAGNOSIS — D509 Iron deficiency anemia, unspecified: Secondary | ICD-10-CM | POA: Diagnosis not present

## 2019-09-14 DIAGNOSIS — D123 Benign neoplasm of transverse colon: Secondary | ICD-10-CM | POA: Diagnosis not present

## 2019-09-14 MED ORDER — SODIUM CHLORIDE 0.9 % IV SOLN
510.0000 mg | Freq: Once | INTRAVENOUS | Status: AC
  Administered 2019-09-14: 510 mg via INTRAVENOUS
  Filled 2019-09-14: qty 510

## 2019-09-14 MED ORDER — SODIUM CHLORIDE 0.9 % IV SOLN: Freq: Once | INTRAVENOUS | Status: AC

## 2019-09-14 NOTE — Progress Notes (Signed)
Iron infusion completed today. Pt tolerated well.  Discharged ambulatory. VS stable.

## 2019-09-20 ENCOUNTER — Telehealth: Payer: Self-pay | Admitting: Family Medicine

## 2019-09-20 NOTE — Telephone Encounter (Signed)
°  Incoming Patient Call  09/20/2019  What symptoms do you have? Back pain and pt is so tired she can hardly move and gets out of breathe when she moves around  How long have you been sick? In the last couple of days, pt thinks her Hemoglobin is low  Have you been seen for this problem? no  If your provider decides to give you a prescription, which pharmacy would you like for it to be sent to? [facility]   Patient informed that this information will be sent to the clinical staff for review and that they should receive a follow up call.

## 2019-09-21 NOTE — Telephone Encounter (Signed)
Patient has been experiencing back pain and fatigue.  She is concerned that her hemoglobin level may have dropped again as it has done in the past.  An appointment was scheduled for patient to be seen on Thursday, 09/23/2019 at 8:35 am with Hendricks Limes.

## 2019-09-23 ENCOUNTER — Other Ambulatory Visit: Payer: Self-pay

## 2019-09-23 ENCOUNTER — Ambulatory Visit (INDEPENDENT_AMBULATORY_CARE_PROVIDER_SITE_OTHER): Payer: BC Managed Care – PPO | Admitting: Family Medicine

## 2019-09-23 ENCOUNTER — Encounter: Payer: Self-pay | Admitting: Family Medicine

## 2019-09-23 VITALS — BP 126/79 | HR 65 | Temp 97.3°F | Ht 65.5 in | Wt 201.4 lb

## 2019-09-23 DIAGNOSIS — R0602 Shortness of breath: Secondary | ICD-10-CM

## 2019-09-23 DIAGNOSIS — R5383 Other fatigue: Secondary | ICD-10-CM | POA: Diagnosis not present

## 2019-09-23 DIAGNOSIS — M791 Myalgia, unspecified site: Secondary | ICD-10-CM | POA: Diagnosis not present

## 2019-09-23 LAB — HEMOGLOBIN, FINGERSTICK: Hemoglobin: 14 g/dL (ref 11.1–15.9)

## 2019-09-23 NOTE — Progress Notes (Signed)
Assessment & Plan:  1-3. SOB (shortness of breath) on exertion/Muscle pain/Fatigue - Patient reassured that she is not anemic.  She would like to quit taking the bisoprolol for the next week and see if she feels any better.  I advised her to keep a log of her blood pressure and heart rate when she does this to make sure she is not climbing.  Advised that if she stays off of the medication she needs to make an appointment with either her PCP or cardiologist to let them know she has done this.  She verbalizes understanding and states she will follow up with cardiology. - Hemoglobin, fingerstick - 14.0.   Follow up plan: Return if symptoms worsen or fail to improve.  Hendricks Limes, MSN, APRN, FNP-C [facility]  Subjective:   Patient ID: Stacey Reyes, female    DOB: [birth year], 74 y.o.   MRN: [id number]  HPI: Stacey Reyes is a 74 y.o. female presenting on 09/23/2019 for Back Pain (x 1 week.  Patient states her back does not hurt until she is up walking around.) and Muscle Pain (x 1 week)  Patient reports back pain and muscle pain when she is up walking around.  She is also experiencing exertional shortness of breath.  She is afraid she is anemic again as these are similar symptoms to when she was previously anemic.  She reports she did have her last iron infusion 2 weeks ago.  She is just concerned that she feels tired all the time; which started at the end of last year.  A new medication at that time was her bisoprolol.   ROS: Negative unless specifically indicated above in HPI.   Relevant past medical history reviewed and updated as indicated.   Allergies and medications reviewed and updated.   Current Outpatient Medications:    bisoprolol (ZEBETA) 5 MG tablet, Take 0.5 tablets (2.5 mg total) by mouth daily., Disp: 45 tablet, Rfl: 3   cyanocobalamin ([facility] VITAMIN B12) 2000 MCG tablet, Take 1 tablet (2,000 mcg total) by mouth daily., Disp:  , Rfl:     ergocalciferol (VITAMIN D2) 1.25 MG (50000 UT) capsule, Take 1 capsule (50,000 Units total) by mouth once a week., Disp: 16 capsule, Rfl: 3   Magnesium 200 MG TABS, Take 1 tablet (200 mg total) by mouth daily., Disp: 30 tablet, Rfl: 6   meclizine (ANTIVERT) 25 MG tablet, Take 1 tablet (25 mg total) by mouth 3 (three) times daily as needed for dizziness., Disp: 30 tablet, Rfl: 1   Multiple Vitamin (MULTIVITAMIN) capsule, Take 1 capsule by mouth daily., Disp: , Rfl:    rosuvastatin (CRESTOR) 10 MG tablet, TAKE 1 TABLET BY MOUTH AT  BEDTIME, Disp: 90 tablet, Rfl: 3   thiamine (VITAMIN B-1) 100 MG tablet, Take 100 mg by mouth daily., Disp: , Rfl:    vitamin E 400 UNIT capsule, Take 400 Units by mouth daily. , Disp: , Rfl:    lisinopril (ZESTRIL) 5 MG tablet, Take 1 tablet (5 mg total) by mouth daily., Disp: 90 tablet, Rfl: 3  Allergies  Allergen Reactions   Sulfa Antibiotics Rash    Objective:   BP 126/79    Pulse 65    Temp (!) 97.3 F (36.3 C) (Temporal)    Ht 5' 5.5" (1.664 m)    Wt 201 lb 6.4 oz (91.4 kg)    SpO2 97%    BMI 33.00 kg/m    Physical Exam Vitals reviewed.  Constitutional:  General: She is not in acute distress.    Appearance: Normal appearance. She is obese. She is not ill-appearing, toxic-appearing or diaphoretic.  HENT:     Head: Normocephalic and atraumatic.  Eyes:     General: No scleral icterus.       Right eye: No discharge.        Left eye: No discharge.     Conjunctiva/sclera: Conjunctivae normal.  Cardiovascular:     Rate and Rhythm: Normal rate and regular rhythm.     Heart sounds: Normal heart sounds. No murmur heard.  No friction rub. No gallop.   Pulmonary:     Effort: Pulmonary effort is normal. No respiratory distress.     Breath sounds: Normal breath sounds. No stridor. No wheezing, rhonchi or rales.  Musculoskeletal:        General: Normal range of motion.     Cervical back: Normal range of motion.  Skin:    General: Skin is warm  and dry.     Capillary Refill: Capillary refill takes less than 2 seconds.  Neurological:     General: No focal deficit present.     Mental Status: She is alert and oriented to person, place, and time. Mental status is at baseline.  Psychiatric:        Mood and Affect: Mood normal.        Behavior: Behavior normal.        Thought Content: Thought content normal.        Judgment: Judgment normal.

## 2019-10-28 ENCOUNTER — Other Ambulatory Visit (HOSPITAL_COMMUNITY): Payer: BC Managed Care – PPO

## 2019-11-04 ENCOUNTER — Ambulatory Visit (HOSPITAL_COMMUNITY): Payer: BC Managed Care – PPO | Admitting: Nurse Practitioner

## 2019-11-14 ENCOUNTER — Emergency Department (HOSPITAL_BASED_OUTPATIENT_CLINIC_OR_DEPARTMENT_OTHER): Payer: Medicare Other

## 2019-11-14 ENCOUNTER — Emergency Department (HOSPITAL_BASED_OUTPATIENT_CLINIC_OR_DEPARTMENT_OTHER)
Admission: EM | Admit: 2019-11-14 | Discharge: 2019-11-14 | Disposition: A | Discharge status: Home / Self Care | Payer: Medicare Other | Attending: Emergency Medicine | Admitting: Emergency Medicine

## 2019-11-14 ENCOUNTER — Other Ambulatory Visit: Payer: Self-pay

## 2019-11-14 ENCOUNTER — Encounter (HOSPITAL_BASED_OUTPATIENT_CLINIC_OR_DEPARTMENT_OTHER): Payer: Self-pay | Admitting: Emergency Medicine

## 2019-11-14 DIAGNOSIS — R000 Tachycardia, unspecified: Secondary | ICD-10-CM | POA: Diagnosis not present

## 2019-11-14 DIAGNOSIS — R1030 Lower abdominal pain, unspecified: Secondary | ICD-10-CM | POA: Diagnosis present

## 2019-11-14 DIAGNOSIS — K760 Fatty (change of) liver, not elsewhere classified: Secondary | ICD-10-CM | POA: Diagnosis not present

## 2019-11-14 DIAGNOSIS — I10 Essential (primary) hypertension: Secondary | ICD-10-CM | POA: Insufficient documentation

## 2019-11-14 DIAGNOSIS — Z87891 Personal history of nicotine dependence: Secondary | ICD-10-CM | POA: Insufficient documentation

## 2019-11-14 DIAGNOSIS — K5792 Diverticulitis of intestine, part unspecified, without perforation or abscess without bleeding: Secondary | ICD-10-CM | POA: Diagnosis not present

## 2019-11-14 DIAGNOSIS — M47816 Spondylosis without myelopathy or radiculopathy, lumbar region: Secondary | ICD-10-CM | POA: Diagnosis not present

## 2019-11-14 DIAGNOSIS — Z20822 Contact with and (suspected) exposure to covid-19: Secondary | ICD-10-CM | POA: Insufficient documentation

## 2019-11-14 DIAGNOSIS — Z79899 Other long term (current) drug therapy: Secondary | ICD-10-CM | POA: Diagnosis not present

## 2019-11-14 DIAGNOSIS — K5732 Diverticulitis of large intestine without perforation or abscess without bleeding: Secondary | ICD-10-CM | POA: Insufficient documentation

## 2019-11-14 DIAGNOSIS — K529 Noninfective gastroenteritis and colitis, unspecified: Secondary | ICD-10-CM | POA: Diagnosis not present

## 2019-11-14 DIAGNOSIS — Z03818 Encounter for observation for suspected exposure to other biological agents ruled out: Secondary | ICD-10-CM | POA: Diagnosis not present

## 2019-11-14 DIAGNOSIS — R079 Chest pain, unspecified: Secondary | ICD-10-CM | POA: Diagnosis not present

## 2019-11-14 LAB — URINALYSIS, MICROSCOPIC (REFLEX): RBC / HPF: NONE SEEN RBC/hpf (ref 0–5)

## 2019-11-14 LAB — COMPREHENSIVE METABOLIC PANEL WITH GFR
ALT: 20 U/L (ref 0–44)
AST: 25 U/L (ref 15–41)
Albumin: 4.1 g/dL (ref 3.5–5.0)
Alkaline Phosphatase: 31 U/L — ABNORMAL LOW (ref 38–126)
Anion gap: 9 (ref 5–15)
BUN: 15 mg/dL (ref 8–23)
CO2: 23 mmol/L (ref 22–32)
Calcium: 9.3 mg/dL (ref 8.9–10.3)
Chloride: 102 mmol/L (ref 98–111)
Creatinine, Ser: 0.67 mg/dL (ref 0.44–1.00)
GFR calc Af Amer: >60 mL/min
GFR calc non Af Amer: >60 mL/min
Glucose, Bld: 102 mg/dL — ABNORMAL HIGH (ref 70–99)
Potassium: 3.8 mmol/L (ref 3.5–5.1)
Sodium: 134 mmol/L — ABNORMAL LOW (ref 135–145)
Total Bilirubin: 1 mg/dL (ref 0.3–1.2)
Total Protein: 6.8 g/dL (ref 6.5–8.1)

## 2019-11-14 LAB — URINALYSIS, ROUTINE W REFLEX MICROSCOPIC
Bilirubin Urine: NEGATIVE
Glucose, UA: NEGATIVE mg/dL
Hgb urine dipstick: NEGATIVE
Ketones, ur: NEGATIVE mg/dL
Nitrite: NEGATIVE
Protein, ur: NEGATIVE mg/dL
Specific Gravity, Urine: 1.015 (ref 1.005–1.030)
pH: 8 (ref 5.0–8.0)

## 2019-11-14 LAB — TROPONIN I (HIGH SENSITIVITY)
Troponin I (High Sensitivity): 23 ng/L — ABNORMAL HIGH
Troponin I (High Sensitivity): 26 ng/L — ABNORMAL HIGH

## 2019-11-14 LAB — CBC
HCT: 35.8 % — ABNORMAL LOW (ref 36.0–46.0)
Hemoglobin: 11.9 g/dL — ABNORMAL LOW (ref 12.0–15.0)
MCH: 30.6 pg (ref 26.0–34.0)
MCHC: 33.2 g/dL (ref 30.0–36.0)
MCV: 92 fL (ref 80.0–100.0)
Platelets: 274 10*3/uL (ref 150–400)
RBC: 3.89 MIL/uL (ref 3.87–5.11)
RDW: 12.7 % (ref 11.5–15.5)
WBC: 13.6 10*3/uL — ABNORMAL HIGH (ref 4.0–10.5)
nRBC: 0 % (ref 0.0–0.2)

## 2019-11-14 LAB — SARS CORONAVIRUS 2 BY RT PCR (HOSPITAL ORDER, PERFORMED IN ~~LOC~~ HOSPITAL LAB): SARS Coronavirus 2: NEGATIVE

## 2019-11-14 LAB — LIPASE, BLOOD: Lipase: 26 U/L (ref 11–51)

## 2019-11-14 LAB — OCCULT BLOOD X 1 CARD TO LAB, STOOL: Fecal Occult Bld: POSITIVE — AB

## 2019-11-14 MED ORDER — AMOXICILLIN-POT CLAVULANATE 875-125 MG PO TABS: 1.0000 | ORAL_TABLET | Freq: Two times a day (BID) | ORAL | 0 refills | Status: AC

## 2019-11-14 MED ORDER — IOHEXOL 300 MG/ML  SOLN
100.0000 mL | Freq: Once | INTRAMUSCULAR | Status: AC | PRN
  Administered 2019-11-14: 100 mL via INTRAVENOUS

## 2019-11-14 MED ORDER — FENTANYL CITRATE (PF) 100 MCG/2ML IJ SOLN
50.0000 ug | Freq: Once | INTRAMUSCULAR | Status: AC
  Administered 2019-11-14: 50 ug via INTRAVENOUS
  Filled 2019-11-14: qty 2

## 2019-11-14 NOTE — ED Notes (Signed)
Requesting COVID test, states went to [facility] and and there was a COVID + person there

## 2019-11-14 NOTE — ED Triage Notes (Signed)
Low abd pain radiating into her chest this week. Also reports dark stool today.

## 2019-11-14 NOTE — Discharge Instructions (Signed)
Your work-up today revealed that you have diverticulitis, as we discussed you want you to take your full course of antibiotics.  You can use Tylenol as prescribed on the bottle for pain.  Make sure to stay hydrated.  Please use the attached instructions.  As we also discussed your troponin was slightly elevated, I do want you to speak to your heart doctor about this.  There are also some changes in your EKG that were changed from last time, also speak to your heart doctor about this.  If you have any new or worsening concerning symptoms please come back to the emergency department, worsening chest pain on exertion, shortness of breath, weakness, numbness and tingling. I hope you feel better!

## 2019-11-14 NOTE — ED Provider Notes (Signed)
[facility] EMERGENCY DEPARTMENT Provider Note   CSN: [id number] Arrival date & time: 11/14/19  5784     History Chief Complaint  Patient presents with  . Abdominal Pain    Stacey Reyes is a 74 y.o. female with pertinent past medical history of hyperlipidemia, hypertension that presents emergency department today for lower abdominal pain that radiates into her lower chest.    States that this started 2 days ago, worse last night therefore came into the emergency department.   Denies any dysuria, hematuria.  Also admits to tarry stools for the past 2 days, did have a bowel movement today.  States that she is never had anything like this happen her before.  Denies any nausea or vomiting.  Also states that she has dry cough.  No production.  No hemoptysis. Denies any fevers, chills,  Other URI-like symptoms.  States that pain is a 10/10 and feels like a cramping sensation.  Also wants to be Covid tested because she went to [employer] yesterday and was in contact with someone with Covid.  States that she did get her Covid vaccines.  Denies any vaginal symptoms, vaginal discharge, vaginal bleeding. No new shortness of breath. Chest pain is more lower/epigastric. Does not radiate anywhere, no exertional component. No history of MI.    HPI     Past Medical History:  Diagnosis Date  . Anemia   . Cataract    removed bilateral  . Chronic cystitis   . Heart murmur   . Hyperlipidemia   . Hypertension   . Personal history of colonic polyps-adenomas 01/07/2012   2009 - 2 diminutive adenomas (prior polyps also) 01/07/2012 - 2 diminutive adenomas      Patient Active Problem List   Diagnosis Date Noted  . Educated about COVID-19 virus infection 07/05/2019  . SOB (shortness of breath) 07/05/2019  . B12 deficiency 03/25/2019  . Symptomatic anemia 03/24/2019  . Osteopenia after menopause 04/22/2018  . Cardiac murmur due to mitral valve disorder 03/03/2018  . Heart  murmur 03/03/2018  . LVH (left ventricular hypertrophy) 11/20/2015  . Arrhythmia 10/11/2015  . Essential hypertension 02/24/2015  . Hyperlipidemia 02/24/2015  . Personal history of colonic polyps-adenomas 01/07/2012    Past Surgical History:  Procedure Laterality Date  . BREAST BIOPSY Right    No Scar seen   . COLONOSCOPY  multiple     OB History   No obstetric history on file.     Family History  Problem Relation Age of Onset  . Colon cancer Mother 36       80's  . Breast cancer Sister   . Asthma Brother   . Colon polyps Neg Hx   . Esophageal cancer Neg Hx   . Rectal cancer Neg Hx   . Stomach cancer Neg Hx     Social History   Tobacco Use  . Smoking status: Former Smoker    Quit date: 12/23/1984    Years since quitting: 34.9  . Smokeless tobacco: Never Used  Vaping Use  . Vaping Use: Never used  Substance Use Topics  . Alcohol use: Yes    Comment: rare  . Drug use: No    Home Medications Prior to Admission medications   Medication Sig Start Date End Date Taking? Authorizing Provider  amoxicillin-clavulanate (AUGMENTIN) 875-125 MG tablet Take 1 tablet by mouth every 12 (twelve) hours for 7 days. 11/14/19 11/21/19  Alfredia Client, PA-C  bisoprolol (ZEBETA) 5 MG tablet Take 0.5 tablets (  2.5 mg total) by mouth daily. 03/23/19   Lendon Colonel, NP  cyanocobalamin ([employer] VITAMIN B12) 2000 MCG tablet Take 1 tablet (2,000 mcg total) by mouth daily. 03/25/19   Johnson, Clanford L, MD  ergocalciferol (VITAMIN D2) 1.25 MG (50000 UT) capsule Take 1 capsule (50,000 Units total) by mouth once a week. 08/26/19   Lockamy, Randi L, NP-C  lisinopril (ZESTRIL) 5 MG tablet Take 1 tablet (5 mg total) by mouth daily. 03/23/19 08/26/19  Lendon Colonel, NP  Magnesium 200 MG TABS Take 1 tablet (200 mg total) by mouth daily. 03/23/19   Lendon Colonel, NP  meclizine (ANTIVERT) 25 MG tablet Take 1 tablet (25 mg total) by mouth 3 (three) times daily as needed for dizziness. 05/05/19    Derek Jack, MD  Multiple Vitamin (MULTIVITAMIN) capsule Take 1 capsule by mouth daily.    [provider]  rosuvastatin (CRESTOR) 10 MG tablet TAKE 1 TABLET BY MOUTH AT  BEDTIME 05/14/19   Ronnie Doss M, DO  thiamine (VITAMIN B-1) 100 MG tablet Take 100 mg by mouth daily.    [provider]  vitamin E 400 UNIT capsule Take 400 Units by mouth daily.     [provider]    Allergies    Sulfa antibiotics  Review of Systems   Review of Systems  Constitutional: Negative for chills, diaphoresis, fatigue and fever.  HENT: Negative for congestion, sore throat and trouble swallowing.   Eyes: Negative for pain and visual disturbance.  Respiratory: Positive for cough. Negative for shortness of breath and wheezing.   Cardiovascular: Positive for chest pain. Negative for palpitations and leg swelling.  Gastrointestinal: Positive for abdominal distention, abdominal pain and blood in stool. Negative for diarrhea, nausea and vomiting.  Genitourinary: Negative for difficulty urinating.  Musculoskeletal: Negative for back pain, neck pain and neck stiffness.  Skin: Negative for pallor.  Neurological: Negative for dizziness, speech difficulty, weakness and headaches.  Psychiatric/Behavioral: Negative for confusion.    Physical Exam Updated Vital Signs BP 127/83   Pulse 85   Temp 99.7 F (37.6 C) (Oral)   Resp 18   Ht 5\' 6"  (1.676 m)   Wt 91.6 kg   SpO2 97%   BMI 32.60 kg/m   Physical Exam Exam conducted with a chaperone present.  Constitutional:      General: She is not in acute distress.    Appearance: Normal appearance. She is not ill-appearing, toxic-appearing or diaphoretic.  HENT:     Head: Normocephalic and atraumatic.     Mouth/Throat:     Mouth: Mucous membranes are moist.     Pharynx: Oropharynx is clear.  Eyes:     General: No scleral icterus.    Extraocular Movements: Extraocular movements intact.     Pupils: Pupils are equal, round,  and reactive to light.  Cardiovascular:     Rate and Rhythm: Normal rate and regular rhythm.     Pulses: Normal pulses.     Heart sounds: Normal heart sounds.  Pulmonary:     Effort: Pulmonary effort is normal. No respiratory distress.     Breath sounds: Normal breath sounds. No stridor. No wheezing, rhonchi or rales.  Chest:     Chest wall: No tenderness.  Abdominal:     General: Abdomen is flat. Bowel sounds are normal. There is no distension.     Palpations: Abdomen is soft.     Tenderness: There is abdominal tenderness in the suprapubic area. There is no right CVA  tenderness, left CVA tenderness, guarding or rebound. Negative signs include Murphy's sign, Rovsing's sign, McBurney's sign, psoas sign and obturator sign.  Genitourinary:    Comments: Chaperone present. Digital Rectal exam reveals sphincter with good tone. No external hemorrhoids, masses, or fissures. Stool color is brown with no overt blood. No gross melena.   Musculoskeletal:        General: No swelling or tenderness. Normal range of motion.     Cervical back: Normal range of motion and neck supple. No rigidity.     Right lower leg: No edema.     Left lower leg: No edema.  Skin:    General: Skin is warm and dry.     Capillary Refill: Capillary refill takes less than 2 seconds.     Coloration: Skin is not pale.  Neurological:     General: No focal deficit present.     Mental Status: She is alert and oriented to person, place, and time.     Cranial Nerves: No cranial nerve deficit.     Sensory: No sensory deficit.     Motor: No weakness.     Coordination: Coordination normal.     Gait: Gait normal.  Psychiatric:        Mood and Affect: Mood normal.        Behavior: Behavior normal.     ED Results / Procedures / Treatments   Labs (all labs ordered are listed, but only abnormal results are displayed) Labs Reviewed  COMPREHENSIVE METABOLIC PANEL - Abnormal; Notable for the following components:      Result  Value   Sodium 134 (*)    Glucose, Bld 102 (*)    Alkaline Phosphatase 31 (*)    All other components within normal limits  CBC - Abnormal; Notable for the following components:   WBC 13.6 (*)    Hemoglobin 11.9 (*)    HCT 35.8 (*)    All other components within normal limits  URINALYSIS, ROUTINE W REFLEX MICROSCOPIC - Abnormal; Notable for the following components:   APPearance CLOUDY (*)    Leukocytes,Ua TRACE (*)    All other components within normal limits  URINALYSIS, MICROSCOPIC (REFLEX) - Abnormal; Notable for the following components:   Bacteria, UA MANY (*)    All other components within normal limits  OCCULT BLOOD X 1 CARD TO LAB, STOOL - Abnormal; Notable for the following components:   Fecal Occult Bld POSITIVE (*)    All other components within normal limits  TROPONIN I (HIGH SENSITIVITY) - Abnormal; Notable for the following components:   Troponin I (High Sensitivity) 23 (*)    All other components within normal limits  TROPONIN I (HIGH SENSITIVITY) - Abnormal; Notable for the following components:   Troponin I (High Sensitivity) 26 (*)    All other components within normal limits  SARS CORONAVIRUS 2 BY RT PCR (HOSPITAL ORDER, [facility])  URINE CULTURE  LIPASE, BLOOD  POC OCCULT BLOOD, ED    EKG EKG Interpretation  Date/Time:  Sunday November 14 2019 08:30:56 EDT Ventricular Rate:  102 PR Interval:  178 QRS Duration: 106 QT Interval:  358 QTC Calculation: 466 R Axis:   47 Text Interpretation: Sinus tachycardia Left ventricular hypertrophy with repolarization abnormality ( Sokolow-Lyon , Cornell product ) Cannot rule out Septal infarct , age undetermined Abnormal ECG When compared to prior, new ST abnormalities in leads 1,2. No STEMI Confirmed by Antony Blackbird [phone]) on 11/14/2019 8:36:41 AM   Radiology  CT Abdomen Pelvis W Contrast  Result Date: 11/14/2019 CLINICAL DATA:  Abdominal pain and distension. EXAM: CT ABDOMEN AND PELVIS  WITH CONTRAST TECHNIQUE: Multidetector CT imaging of the abdomen and pelvis was performed using the standard protocol following bolus administration of intravenous contrast. CONTRAST:  116mL OMNIPAQUE IOHEXOL 300 MG/ML  SOLN COMPARISON:  None. FINDINGS: Lower chest: No acute abnormality. Hepatobiliary: There is diffuse fatty infiltration of the liver parenchyma. No focal liver abnormality is seen. No gallstones, gallbladder wall thickening, or biliary dilatation. Pancreas: Unremarkable. No pancreatic ductal dilatation or surrounding inflammatory changes. Spleen: Normal in size without focal abnormality. Adrenals/Urinary Tract: Adrenal glands are unremarkable. Kidneys are normal, without renal calculi, focal lesion, or hydronephrosis. Bladder is unremarkable. Stomach/Bowel: Stomach is within normal limits. Appendix appears normal. Markedly inflamed diverticula are seen within the mid sigmoid colon. A moderate to marked amount of associated Peri colonic inflammatory fat stranding is seen. There is no evidence of associated perforation or abscess. Vascular/Lymphatic: No significant vascular findings are present. No enlarged abdominal or pelvic lymph nodes. Reproductive: Uterus and bilateral adnexa are unremarkable. Other: No abdominal wall hernia or abnormality. No abdominopelvic ascites. Musculoskeletal: Moderate to marked severity multilevel degenerative changes seen throughout the lumbar spine. IMPRESSION: 1. Sigmoid diverticulitis without evidence of associated perforation or abscess. 2. Hepatic steatosis. Electronically Signed   By: Virgina Norfolk M.D.   On: 11/14/2019 15:37   DG Chest Port 1 View  Result Date: 11/14/2019 CLINICAL DATA:  Chest pain. EXAM: PORTABLE CHEST 1 VIEW COMPARISON:  March 09, 2019 FINDINGS: There is no evidence of acute infiltrate, pleural effusion or pneumothorax. The cardiac silhouette is borderline in size. Multilevel degenerative changes seen throughout the thoracic spine.  IMPRESSION: No active disease. Electronically Signed   By: Virgina Norfolk M.D.   On: 11/14/2019 15:18    Procedures Procedures (including critical care time)  Medications Ordered in ED Medications  fentaNYL (SUBLIMAZE) injection 50 mcg (50 mcg Intravenous Given 11/14/19 1422)  iohexol (OMNIPAQUE) 300 MG/ML solution 100 mL (100 mLs Intravenous Contrast Given 11/14/19 1511)    ED Course  I have reviewed the triage vital signs and the nursing notes.  Pertinent labs & imaging results that were available during my care of the patient were reviewed by me and considered in my medical decision making (see chart for details).    MDM Rules/Calculators/A&P                         Stacey Reyes is a 74 y.o. female with remote medical history of hyperlipidemia, hypertension that presents emergency department today for lower abdominal pain that radiates into her chest with tarry stools.  Fecal occult positive.  Physical exam with tenderness to suprapubic region. CBC and CMP without any acute abnormalities.  Hemoglobin is 11.9, was 14 a month ago.  Leukocytosis of 13.6.  First troponin 23.  Second troponin 26.  Covid test negative.  Chest x-ray interpreted by me without any acute cardiopulmonary disease.  EKG with some changes from last time, see Dr. Doreene Burke interpretation above.  CT abdomen shows sigmoid diverticulitis without perforation or abscess.  This would explain patient's findings and fecal occult blood.  Did discuss this with patient.  Patient to be discharged with appropriate antibiotics and follow-up.  Also discussed increased troponins from baseline and EKG with patient, patient states that she will follow up with her cardiologist.  Upon reassessment patient states that pain has completely resolved with fentanyl on board.  States  that she is ready to go home. I think that patients pain was stemming from diverticulitis, including chest pain. However with slightly elevated troponin to  discuss in depth return precautions for chest pain, Pt is currently chest pain free and has been for 5 hours, only 50 of fentanyl given once, do not think chest pain is cardiac in nature, I think the chest pain was referred from her abdominal pain. Pt has established care with cardiologist, states she will call them when she leaves ED.   Doubt need for further emergent work up at this time. I explained the diagnosis and have given explicit precautions to return to the ER including for any other new or worsening symptoms. The patient understands and accepts the medical plan as it's been dictated and I have answered their questions. Discharge instructions concerning home care and prescriptions have been given. The patient is STABLE and is discharged to home in good condition.  I discussed this case with my attending physician who cosigned this note including patient's presenting symptoms, physical exam, and planned diagnostics and interventions. Attending physician stated agreement with plan or made changes to plan which were implemented.   Attending physician assessed patient at bedside.   Final Clinical Impression(s) / ED Diagnoses Final diagnoses:  Sigmoid diverticulitis    Rx / DC Orders ED Discharge Orders         Ordered    amoxicillin-clavulanate (AUGMENTIN) 875-125 MG tablet  Every 12 hours        11/14/19 1748           Alfredia Client, PA-C 11/14/19 2121    Tegeler, Gwenyth Allegra, MD 11/16/19 1455

## 2019-11-14 NOTE — ED Notes (Signed)
Returned to room from CT. 

## 2019-11-15 LAB — URINE CULTURE: Culture: <10000 — AB

## 2019-11-22 ENCOUNTER — Emergency Department (HOSPITAL_COMMUNITY): Payer: Medicare Other

## 2019-11-22 ENCOUNTER — Other Ambulatory Visit: Payer: Self-pay

## 2019-11-22 ENCOUNTER — Inpatient Hospital Stay (HOSPITAL_COMMUNITY)
Admission: EM | Admit: 2019-11-22 | Discharge: 2019-11-24 | DRG: 378 | Disposition: A | Discharge status: Home / Self Care | Payer: Medicare Other | Attending: Family Medicine | Admitting: Family Medicine

## 2019-11-22 ENCOUNTER — Encounter (HOSPITAL_COMMUNITY): Payer: Self-pay | Admitting: Emergency Medicine

## 2019-11-22 DIAGNOSIS — Z882 Allergy status to sulfonamides status: Secondary | ICD-10-CM | POA: Diagnosis not present

## 2019-11-22 DIAGNOSIS — M8580 Other specified disorders of bone density and structure, unspecified site: Secondary | ICD-10-CM | POA: Diagnosis not present

## 2019-11-22 DIAGNOSIS — Z20822 Contact with and (suspected) exposure to covid-19: Secondary | ICD-10-CM | POA: Diagnosis present

## 2019-11-22 DIAGNOSIS — D649 Anemia, unspecified: Secondary | ICD-10-CM | POA: Diagnosis not present

## 2019-11-22 DIAGNOSIS — D62 Acute posthemorrhagic anemia: Secondary | ICD-10-CM | POA: Diagnosis not present

## 2019-11-22 DIAGNOSIS — K449 Diaphragmatic hernia without obstruction or gangrene: Secondary | ICD-10-CM | POA: Diagnosis present

## 2019-11-22 DIAGNOSIS — Z66 Do not resuscitate: Secondary | ICD-10-CM | POA: Diagnosis not present

## 2019-11-22 DIAGNOSIS — K922 Gastrointestinal hemorrhage, unspecified: Secondary | ICD-10-CM | POA: Diagnosis not present

## 2019-11-22 DIAGNOSIS — Z825 Family history of asthma and other chronic lower respiratory diseases: Secondary | ICD-10-CM

## 2019-11-22 DIAGNOSIS — E785 Hyperlipidemia, unspecified: Secondary | ICD-10-CM | POA: Diagnosis present

## 2019-11-22 DIAGNOSIS — I517 Cardiomegaly: Secondary | ICD-10-CM | POA: Diagnosis not present

## 2019-11-22 DIAGNOSIS — K5733 Diverticulitis of large intestine without perforation or abscess with bleeding: Secondary | ICD-10-CM | POA: Diagnosis not present

## 2019-11-22 DIAGNOSIS — Z87891 Personal history of nicotine dependence: Secondary | ICD-10-CM | POA: Diagnosis not present

## 2019-11-22 DIAGNOSIS — Z803 Family history of malignant neoplasm of breast: Secondary | ICD-10-CM

## 2019-11-22 DIAGNOSIS — Z800 Family history of malignant neoplasm of digestive organs: Secondary | ICD-10-CM | POA: Diagnosis not present

## 2019-11-22 DIAGNOSIS — K264 Chronic or unspecified duodenal ulcer with hemorrhage: Secondary | ICD-10-CM | POA: Diagnosis not present

## 2019-11-22 DIAGNOSIS — K921 Melena: Secondary | ICD-10-CM | POA: Diagnosis not present

## 2019-11-22 DIAGNOSIS — I10 Essential (primary) hypertension: Secondary | ICD-10-CM | POA: Diagnosis not present

## 2019-11-22 DIAGNOSIS — J90 Pleural effusion, not elsewhere classified: Secondary | ICD-10-CM | POA: Diagnosis not present

## 2019-11-22 DIAGNOSIS — R0602 Shortness of breath: Secondary | ICD-10-CM | POA: Diagnosis not present

## 2019-11-22 DIAGNOSIS — D509 Iron deficiency anemia, unspecified: Secondary | ICD-10-CM | POA: Diagnosis not present

## 2019-11-22 LAB — CBC WITH DIFFERENTIAL/PLATELET
Abs Immature Granulocytes: 0.17 10*3/uL — ABNORMAL HIGH (ref 0.00–0.07)
Basophils Absolute: 0.1 10*3/uL (ref 0.0–0.1)
Basophils Relative: 1 %
Eosinophils Absolute: 0.2 10*3/uL (ref 0.0–0.5)
Eosinophils Relative: 2 %
HCT: 19.4 % — ABNORMAL LOW (ref 36.0–46.0)
Hemoglobin: 5.9 g/dL — CL (ref 12.0–15.0)
Immature Granulocytes: 2 %
Lymphocytes Relative: 26 %
Lymphs Abs: 3 10*3/uL (ref 0.7–4.0)
MCH: 31.4 pg (ref 26.0–34.0)
MCHC: 30.4 g/dL (ref 30.0–36.0)
MCV: 103.2 fL — ABNORMAL HIGH (ref 80.0–100.0)
Monocytes Absolute: 0.9 10*3/uL (ref 0.1–1.0)
Monocytes Relative: 8 %
Neutro Abs: 7 10*3/uL (ref 1.7–7.7)
Neutrophils Relative %: 61 %
Platelets: 311 10*3/uL (ref 150–400)
RBC: 1.88 MIL/uL — ABNORMAL LOW (ref 3.87–5.11)
RDW: 16.1 % — ABNORMAL HIGH (ref 11.5–15.5)
WBC: 11.4 10*3/uL — ABNORMAL HIGH (ref 4.0–10.5)
nRBC: 1 % — ABNORMAL HIGH (ref 0.0–0.2)

## 2019-11-22 LAB — SARS CORONAVIRUS 2 BY RT PCR (HOSPITAL ORDER, PERFORMED IN ~~LOC~~ HOSPITAL LAB): SARS Coronavirus 2: NEGATIVE

## 2019-11-22 LAB — FOLATE: Folate: 7.4 ng/mL (ref >5.9)

## 2019-11-22 LAB — COMPREHENSIVE METABOLIC PANEL
ALT: 17 U/L (ref 0–44)
AST: 24 U/L (ref 15–41)
Albumin: 3.7 g/dL (ref 3.5–5.0)
Alkaline Phosphatase: 23 U/L — ABNORMAL LOW (ref 38–126)
Anion gap: 8 (ref 5–15)
BUN: 15 mg/dL (ref 8–23)
CO2: 24 mmol/L (ref 22–32)
Calcium: 8.4 mg/dL — ABNORMAL LOW (ref 8.9–10.3)
Chloride: 103 mmol/L (ref 98–111)
Creatinine, Ser: 0.67 mg/dL (ref 0.44–1.00)
GFR calc Af Amer: >60 mL/min (ref >60)
GFR calc non Af Amer: >60 mL/min (ref >60)
Glucose, Bld: 122 mg/dL — ABNORMAL HIGH (ref 70–99)
Potassium: 3.7 mmol/L (ref 3.5–5.1)
Sodium: 135 mmol/L (ref 135–145)
Total Bilirubin: 0.5 mg/dL (ref 0.3–1.2)
Total Protein: 5.9 g/dL — ABNORMAL LOW (ref 6.5–8.1)

## 2019-11-22 LAB — VITAMIN B12: Vitamin B-12: 380 pg/mL (ref 180–914)

## 2019-11-22 LAB — RETICULOCYTES
Immature Retic Fract: 52.3 % — ABNORMAL HIGH (ref 2.3–15.9)
RBC.: 1.67 MIL/uL — ABNORMAL LOW (ref 3.87–5.11)
Retic Count, Absolute: 192.4 10*3/uL — ABNORMAL HIGH (ref 19.0–186.0)
Retic Ct Pct: 11.5 % — ABNORMAL HIGH (ref 0.4–3.1)

## 2019-11-22 LAB — IRON AND TIBC
Iron: 70 ug/dL (ref 28–170)
Saturation Ratios: 20 % (ref 10.4–31.8)
TIBC: 352 ug/dL (ref 250–450)
UIBC: 282 ug/dL

## 2019-11-22 LAB — POC OCCULT BLOOD, ED: Fecal Occult Bld: POSITIVE — AB

## 2019-11-22 LAB — BRAIN NATRIURETIC PEPTIDE: B Natriuretic Peptide: 239 pg/mL — ABNORMAL HIGH (ref 0.0–100.0)

## 2019-11-22 LAB — PREPARE RBC (CROSSMATCH)

## 2019-11-22 LAB — TROPONIN I (HIGH SENSITIVITY)
Troponin I (High Sensitivity): 141 ng/L (ref <18)
Troponin I (High Sensitivity): 115 ng/L (ref <18)

## 2019-11-22 LAB — FERRITIN: Ferritin: 57 ng/mL (ref 11–307)

## 2019-11-22 MED ORDER — MELATONIN 3 MG PO TABS
6.0000 mg | ORAL_TABLET | Freq: Every day | ORAL | Status: DC
  Administered 2019-11-22 – 2019-11-23 (×2): 6 mg via ORAL
  Filled 2019-11-22 (×2): qty 2

## 2019-11-22 MED ORDER — PANTOPRAZOLE SODIUM 40 MG IV SOLR
40.0000 mg | Freq: Two times a day (BID) | INTRAVENOUS | Status: DC
  Administered 2019-11-22 – 2019-11-24 (×5): 40 mg via INTRAVENOUS
  Filled 2019-11-22 (×5): qty 40

## 2019-11-22 MED ORDER — SODIUM CHLORIDE 0.9% FLUSH
3.0000 mL | Freq: Two times a day (BID) | INTRAVENOUS | Status: DC
  Administered 2019-11-22 – 2019-11-23 (×3): 3 mL via INTRAVENOUS

## 2019-11-22 MED ORDER — SODIUM CHLORIDE 0.9% IV SOLUTION: Freq: Once | INTRAVENOUS | Status: AC

## 2019-11-22 MED ORDER — ONDANSETRON HCL 4 MG PO TABS: 4.0000 mg | ORAL_TABLET | Freq: Four times a day (QID) | ORAL | Status: DC | PRN

## 2019-11-22 MED ORDER — ACETAMINOPHEN 325 MG PO TABS
650.0000 mg | ORAL_TABLET | Freq: Four times a day (QID) | ORAL | Status: DC | PRN
  Administered 2019-11-22 – 2019-11-24 (×3): 650 mg via ORAL
  Filled 2019-11-22 (×3): qty 2

## 2019-11-22 MED ORDER — SODIUM CHLORIDE 0.9 % IV SOLN: 250.0000 mL | INTRAVENOUS | Status: DC | PRN

## 2019-11-22 MED ORDER — ACETAMINOPHEN 650 MG RE SUPP: 650.0000 mg | Freq: Four times a day (QID) | RECTAL | Status: DC | PRN

## 2019-11-22 MED ORDER — SODIUM CHLORIDE 0.9% FLUSH: 3.0000 mL | INTRAVENOUS | Status: DC | PRN

## 2019-11-22 MED ORDER — ONDANSETRON HCL 4 MG/2ML IJ SOLN: 4.0000 mg | Freq: Four times a day (QID) | INTRAMUSCULAR | Status: DC | PRN

## 2019-11-22 NOTE — ED Notes (Signed)
CRITICAL VALUE ALERT  Critical Value: Hgb 5.9  Date & Time Notied: 11/22/19 & 1141 am  Provider Notified: Dr. Laverta Baltimore  Orders Received/Actions taken: N/A

## 2019-11-22 NOTE — H&P (Signed)
History and Physical    Stacey Reyes BDZ:[id number] DOB: 01-26-1946 DOA: 11/22/2019  PCP: [name], DO   Patient coming from: Home  Chief Complaint: Patrice Paradise  HPI: Stacey Reyes is a 74 y.o. female with medical history significant for hypertension, iron deficiency anemia, dyslipidemia, obesity, and recent diverticulitis with treatment on antibiotics who presented to the ED with shortness of breath particularly with exertion as well as ongoing melanotic stools and noted low blood pressures this morning.  Patient states that approximately a week ago, she was seen in [facility] for concern of abdominal pain on her lower left side.  CT findings demonstrated some uncomplicated sigmoid diverticulitis and she was prescribed Augmentin which she has been taking with improvement in abdominal pain.  She states that on Friday of last week she began to have some very dark stools and started to notice some shortness of breath particularly with exertion.  She denies any chest pain, diaphoresis, nausea, vomiting, fever, or chills.  She denies any use of NSAIDs, aspirin, but does state that she drinks wine on an occasional basis.  She states that this morning she was having some worsening fatigue and shortness of breath and was also noted to have low blood pressure readings.  She called her PCP who told her not to take her home blood pressure medications and to present to the ED for further evaluation.   ED Course: Vital signs stable and patient is afebrile.  Hemoglobin noted be 5.9 compared to 11.9 noted just 1 week prior.  She is noted to have mild leukocytosis of 11,400.  She is currently on room air with test negative.  2 units of PRBCs have been ordered for transfusion.  She denies any significant pain at this time.  Review of Systems: All others reviewed as noted above and otherwise negative.  Past Medical History:  Diagnosis Date  . Anemia   . Cataract    removed bilateral  .  Chronic cystitis   . Heart murmur   . Hyperlipidemia   . Hypertension   . Personal history of colonic polyps-adenomas 01/07/2012   2009 - 2 diminutive adenomas (prior polyps also) 01/07/2012 - 2 diminutive adenomas      Past Surgical History:  Procedure Laterality Date  . BREAST BIOPSY Right    No Scar seen   . COLONOSCOPY  multiple     reports that she quit smoking about 34 years ago. She has never used smokeless tobacco. She reports current alcohol use. She reports that she does not use drugs.  Allergies  Allergen Reactions  . Sulfa Antibiotics Rash    Family History  Problem Relation Age of Onset  . Colon cancer Mother 73       80's  . Breast cancer Sister   . Asthma Brother   . Colon polyps Neg Hx   . Esophageal cancer Neg Hx   . Rectal cancer Neg Hx   . Stomach cancer Neg Hx     Prior to Admission medications   Medication Sig Start Date End Date Taking? Authorizing Provider  bisoprolol (ZEBETA) 5 MG tablet Take 0.5 tablets (2.5 mg total) by mouth daily. 03/23/19   Lendon Colonel, NP  cyanocobalamin ([employer] VITAMIN B12) 2000 MCG tablet Take 1 tablet (2,000 mcg total) by mouth daily. 03/25/19   Johnson, Clanford L, MD  ergocalciferol (VITAMIN D2) 1.25 MG (50000 UT) capsule Take 1 capsule (50,000 Units total) by mouth once a week. 08/26/19   Lockamy,  Randi L, NP-C  lisinopril (ZESTRIL) 5 MG tablet Take 1 tablet (5 mg total) by mouth daily. 03/23/19 08/26/19  Lendon Colonel, NP  Magnesium 200 MG TABS Take 1 tablet (200 mg total) by mouth daily. 03/23/19   Lendon Colonel, NP  meclizine (ANTIVERT) 25 MG tablet Take 1 tablet (25 mg total) by mouth 3 (three) times daily as needed for dizziness. 05/05/19   Derek Jack, MD  Multiple Vitamin (MULTIVITAMIN) capsule Take 1 capsule by mouth daily.    [provider]  rosuvastatin (CRESTOR) 10 MG tablet TAKE 1 TABLET BY MOUTH AT  BEDTIME 05/14/19   Ronnie Doss M, DO  thiamine (VITAMIN B-1) 100 MG tablet  Take 100 mg by mouth daily.    [provider]  vitamin E 400 UNIT capsule Take 400 Units by mouth daily.     [provider]    Physical Exam: Vitals:   11/22/19 0644 11/22/19 0645 11/22/19 1210  BP:  (!) 132/57 102/65  Pulse:  96 80  Resp:  18 16  Temp:  98.2 F (36.8 C)   SpO2:  100% 98%  Weight: 92 kg    Height: 5\' 6"  (1.676 m)      Constitutional: NAD, calm, comfortable Vitals:   11/22/19 0644 11/22/19 0645 11/22/19 1210  BP:  (!) 132/57 102/65  Pulse:  96 80  Resp:  18 16  Temp:  98.2 F (36.8 C)   SpO2:  100% 98%  Weight: 92 kg    Height: 5\' 6"  (1.676 m)     Eyes: lids and conjunctivae normal ENMT: Mucous membranes are moist.  Neck: normal, supple Respiratory: clear to auscultation bilaterally. Normal respiratory effort. No accessory muscle use.  Cardiovascular: Regular rate and rhythm, no murmurs. No extremity edema. Abdomen: no tenderness, no distention. Bowel sounds positive.  Musculoskeletal:  No joint deformity upper and lower extremities.   Skin: no rashes, lesions, ulcers.  Psychiatric: Normal judgment and insight. Alert and oriented x 3. Normal mood.   Labs on Admission: I have personally reviewed following labs and imaging studies  CBC: Recent Labs  Lab 11/22/19 1112  WBC 11.4*  NEUTROABS 7.0  HGB 5.9*  HCT 19.4*  MCV 103.2*  PLT 591   Basic Metabolic Panel: Recent Labs  Lab 11/22/19 1112  NA 135  K 3.7  CL 103  CO2 24  GLUCOSE 122*  BUN 15  CREATININE 0.67  CALCIUM 8.4*   GFR: Estimated Creatinine Clearance: 70.5 mL/min (by C-G formula based on SCr of 0.67 mg/dL). Liver Function Tests: Recent Labs  Lab 11/22/19 1112  AST 24  ALT 17  ALKPHOS 23*  BILITOT 0.5  PROT 5.9*  ALBUMIN 3.7   No results for input(s): LIPASE, AMYLASE in the last 168 hours. No results for input(s): AMMONIA in the last 168 hours. Coagulation Profile: No results for input(s): INR, PROTIME in the last 168 hours. Cardiac Enzymes: No  results for input(s): CKTOTAL, CKMB, CKMBINDEX, TROPONINI in the last 168 hours. BNP (last 3 results) No results for input(s): PROBNP in the last 8760 hours. HbA1C: No results for input(s): HGBA1C in the last 72 hours. CBG: No results for input(s): GLUCAP in the last 168 hours. Lipid Profile: No results for input(s): CHOL, HDL, LDLCALC, TRIG, CHOLHDL, LDLDIRECT in the last 72 hours. Thyroid Function Tests: No results for input(s): TSH, T4TOTAL, FREET4, T3FREE, THYROIDAB in the last 72 hours. Anemia Panel: No results for input(s): VITAMINB12, FOLATE, FERRITIN, TIBC, IRON, RETICCTPCT in the last  72 hours. Urine analysis:    Component Value Date/Time   COLORURINE YELLOW 11/14/2019 0851   APPEARANCEUR CLOUDY (A) 11/14/2019 0851   APPEARANCEUR Cloudy (A) 12/12/2016 1758   LABSPEC 1.015 11/14/2019 0851   PHURINE 8.0 11/14/2019 0851   GLUCOSEU NEGATIVE 11/14/2019 0851   HGBUR NEGATIVE 11/14/2019 0851   BILIRUBINUR NEGATIVE 11/14/2019 0851   BILIRUBINUR Negative 12/12/2016 1758   KETONESUR NEGATIVE 11/14/2019 0851   PROTEINUR NEGATIVE 11/14/2019 0851   UROBILINOGEN negative 02/24/2015 1344   NITRITE NEGATIVE 11/14/2019 0851   LEUKOCYTESUR TRACE (A) 11/14/2019 0851    Radiological Exams on Admission: DG Chest 2 View  Result Date: 11/22/2019 CLINICAL DATA:  Shortness of breath. EXAM: CHEST - 2 VIEW COMPARISON:  11/14/2019 FINDINGS: The lungs are clear without focal pneumonia, edema, pneumothorax or pleural effusion. Interstitial markings are diffusely coarsened with chronic features. Cardiopericardial silhouette is at upper limits of normal for size. Mild vascular congestion without edema. Bones are diffusely demineralized. IMPRESSION: Borderline cardiomegaly with chronic interstitial coarsening. No acute cardiopulmonary findings. Electronically Signed   By: Misty Stanley M.D.   On: 11/22/2019 07:12    EKG: Independently reviewed. SR 90bpm.  Assessment/Plan Active Problems:   Acute  blood loss anemia    Symptomatic acute on chronic blood loss anemia -Noted to have prior history of iron deficiency anemia -Related to GI bleed with melanotic stools -Plan to keep on clear liquids with n.p.o. after midnight -Check anemia panel -Transfuse 2 unit PRBCs as ordered -Follow H&H in a.m. -IV PPI twice daily -Appreciate GI consultation for EGD in a.m.  History of hypertension -Hold home bisoprolol and lisinopril given soft blood pressure readings and monitor  History of dyslipidemia -Hold home statin for now  DVT prophylaxis:SCDs Code Status: DNR Family Communication: Patient plans to call family Disposition Plan:Admit for transfusion and GI evaluation Consults called:GI Admission status: Inpatient, Tele   Kemya Shed D Imagene Boss DO [facility]  If 7PM-7AM, please contact night-coverage [email or web address]  11/22/2019, 12:56 PM

## 2019-11-22 NOTE — ED Notes (Signed)
CRITICAL VALUE ALERT  Critical Value:  Troponin 141  Date & Time Notied:  11/22/19 & 1208 hrs  Provider Notified: Dr. Laverta Baltimore  Orders Received/Actions taken: N/A

## 2019-11-22 NOTE — Consult Note (Signed)
Referring Provider: Nuala Alpha, PA-C, [facility] Primary Care Physician:  [name], DO Primary Gastroenterologist:  Dr. Carlean Purl  Date of Admission: 11/22/19 Date of Consultation: 11/22/19  Reason for Consultation:  Acute blood loss anemia  HPI:  Stacey Reyes is a 74 y.o. year old female with history of IDA, established with Dr. Carlean Purl (GI) and Hematology, receiving iron infusions intermittently, presenting with profound anemia, heme positive stools and reported melena. Admitting Hgb 5.9, previously 11.9 a week ago. 2 units PRBCs have been ordered but not started yet. Prior evaluation for IDA undertaken by Dr. Carlean Purl with EGD and colonoscopy Aug 2020. EGD findings of erosive duodenitis but negative for celiac, negative H.pylori. Colonoscopy with diverticulosis and 2 adenomas. No capsule study done, with plans to consider if recurrent IDA.   Patient states she presented to the ED with worsening shortness of breath over the weekend fatigue, leg weakness and feeling numb.  Last week, she had acute left-sided abdominal pain and was seen at [facility] with CT findings of uncomplicated sigmoid diverticulitis, fatty liver. Prescribed Augmentin.   Abdominal pain resolved now. This morning felt nauseated but no vomiting. No fever or chills. This week has had a poor appetite. No dysphagia. Has been seeing black stool all week. Last episode of black stool this morning. Not as loose this morning but was jet black. Denies NSAIDs, aspirin powders.   No PPI. No reflux. No oral iron.    Past Medical History:  Diagnosis Date  . Anemia   . Cataract    removed bilateral  . Chronic cystitis   . Heart murmur   . Hyperlipidemia   . Hypertension   . Personal history of colonic polyps-adenomas 01/07/2012   2009 - 2 diminutive adenomas (prior polyps also) 01/07/2012 - 2 diminutive adenomas      Past Surgical History:  Procedure Laterality Date  . BREAST BIOPSY Right     No Scar seen   . COLONOSCOPY  multiple    Prior to Admission medications   Medication Sig Start Date End Date Taking? Authorizing Provider  bisoprolol (ZEBETA) 5 MG tablet Take 0.5 tablets (2.5 mg total) by mouth daily. 03/23/19   Lendon Colonel, NP  cyanocobalamin ([employer] VITAMIN B12) 2000 MCG tablet Take 1 tablet (2,000 mcg total) by mouth daily. 03/25/19   Johnson, Clanford L, MD  ergocalciferol (VITAMIN D2) 1.25 MG (50000 UT) capsule Take 1 capsule (50,000 Units total) by mouth once a week. 08/26/19   Lockamy, Randi L, NP-C  lisinopril (ZESTRIL) 5 MG tablet Take 1 tablet (5 mg total) by mouth daily. 03/23/19 08/26/19  Lendon Colonel, NP  Magnesium 200 MG TABS Take 1 tablet (200 mg total) by mouth daily. 03/23/19   Lendon Colonel, NP  meclizine (ANTIVERT) 25 MG tablet Take 1 tablet (25 mg total) by mouth 3 (three) times daily as needed for dizziness. 05/05/19   Derek Jack, MD  Multiple Vitamin (MULTIVITAMIN) capsule Take 1 capsule by mouth daily.    [provider]  rosuvastatin (CRESTOR) 10 MG tablet TAKE 1 TABLET BY MOUTH AT  BEDTIME 05/14/19   Ronnie Doss M, DO  thiamine (VITAMIN B-1) 100 MG tablet Take 100 mg by mouth daily.    [provider]  vitamin E 400 UNIT capsule Take 400 Units by mouth daily.     [provider]    Current Facility-Administered Medications  Medication Dose Route Frequency Provider Last Rate Last Admin  .  0.9 %  sodium chloride infusion (Manually program via Guardrails IV Fluids)   Intravenous Once Deliah Boston, PA-C       Current Outpatient Medications  Medication Sig Dispense Refill  . bisoprolol (ZEBETA) 5 MG tablet Take 0.5 tablets (2.5 mg total) by mouth daily. 45 tablet 3  . cyanocobalamin ([employer] VITAMIN B12) 2000 MCG tablet Take 1 tablet (2,000 mcg total) by mouth daily.    . ergocalciferol (VITAMIN D2) 1.25 MG (50000 UT) capsule Take 1 capsule (50,000 Units total) by mouth once a week. 16 capsule  3  . lisinopril (ZESTRIL) 5 MG tablet Take 1 tablet (5 mg total) by mouth daily. 90 tablet 3  . Magnesium 200 MG TABS Take 1 tablet (200 mg total) by mouth daily. 30 tablet 6  . meclizine (ANTIVERT) 25 MG tablet Take 1 tablet (25 mg total) by mouth 3 (three) times daily as needed for dizziness. 30 tablet 1  . Multiple Vitamin (MULTIVITAMIN) capsule Take 1 capsule by mouth daily.    . rosuvastatin (CRESTOR) 10 MG tablet TAKE 1 TABLET BY MOUTH AT  BEDTIME 90 tablet 3  . thiamine (VITAMIN B-1) 100 MG tablet Take 100 mg by mouth daily.    . vitamin E 400 UNIT capsule Take 400 Units by mouth daily.       Allergies as of 11/22/2019 - Review Complete 11/22/2019  Allergen Reaction Noted  . Sulfa antibiotics Rash 04/29/2019    Family History  Problem Relation Age of Onset  . Colon cancer Mother 3       80's  . Breast cancer Sister   . Asthma Brother   . Colon polyps Neg Hx   . Esophageal cancer Neg Hx   . Rectal cancer Neg Hx   . Stomach cancer Neg Hx     Social History   Socioeconomic History  . Marital status: Married    Spouse name: Not on file  . Number of children: 1  . Years of education: Not on file  . Highest education level: Not on file  Occupational History  . Occupation: retired  Tobacco Use  . Smoking status: Former Smoker    Quit date: 12/23/1984    Years since quitting: 34.9  . Smokeless tobacco: Never Used  Vaping Use  . Vaping Use: Never used  Substance and Sexual Activity  . Alcohol use: Yes    Comment: rare  . Drug use: No  . Sexual activity: Not on file  Other Topics Concern  . Not on file  Social History Narrative   Patient is married and retired and has 1 grown child   Social Determinants of Radio broadcast assistant Strain:   . Difficulty of Paying Living Expenses: Not on file  Food Insecurity:   . Worried About Charity fundraiser in the Last Year: Not on file  . Ran Out of Food in the Last Year: Not on file  Transportation Needs:   . Lack  of Transportation (Medical): Not on file  . Lack of Transportation (Non-Medical): Not on file  Physical Activity:   . Days of Exercise per Week: Not on file  . Minutes of Exercise per Session: Not on file  Stress:   . Feeling of Stress : Not on file  Social Connections:   . Frequency of Communication with Friends and Family: Not on file  . Frequency of Social Gatherings with Friends and Family: Not on file  . Attends Religious Services: Not on file  .  Active Member of Clubs or Organizations: Not on file  . Attends Archivist Meetings: Not on file  . Marital Status: Not on file  Intimate Partner Violence:   . Fear of Current or Ex-Partner: Not on file  . Emotionally Abused: Not on file  . Physically Abused: Not on file  . Sexually Abused: Not on file    Review of Systems: Gen: see HPI CV: +chest discomfort if short of breath Resp: see HPI GI: see HPI GU : Denies urinary burning, urinary frequency, urinary incontinence.  MS: Denies joint pain,swelling, cramping Derm: Denies rash, itching, dry skin Psych: Denies depression, anxiety,confusion, or memory loss Heme: see HPI  Physical Exam: Vital signs in last 24 hours: Temp:  [98.2 F (36.8 C)] 98.2 F (36.8 C) (08/30 0645) Pulse Rate:  [96] 96 (08/30 0645) Resp:  [18] 18 (08/30 0645) BP: (132)/(57) 132/57 (08/30 0645) SpO2:  [100 %] 100 % (08/30 0645) Weight:  [92 kg] 92 kg (08/30 0644)   General:   Alert,  Well-developed, well-nourished, pleasant and cooperative in NAD, pale.  Head:  Normocephalic and atraumatic. Eyes:  Sclera clear, no icterus.    Ears:  Normal auditory acuity. Nose:  No deformity, discharge,  or lesions. Mouth:  No deformity or lesions, dentition normal. Lungs:  Clear throughout to auscultation.    Heart:  S1 S2 present with systolic murmur  Abdomen:  Soft, nontender and nondistended. No masses, hepatosplenomegaly or hernias noted. Normal bowel sounds, without guarding, and without rebound.    Rectal:  Deferred  Msk:  Symmetrical without gross deformities. Normal posture. Extremities:  Without edema. Neurologic:  Alert and  oriented x4 Psych:  Alert and cooperative. Normal mood and affect.  Intake/Output from previous day: No intake/output data recorded. Intake/Output this shift: No intake/output data recorded.  Lab Results: Recent Labs    11/22/19 1112  WBC 11.4*  HGB 5.9*  HCT 19.4*  PLT 311   BMET Recent Labs    11/22/19 1112  NA 135  K 3.7  CL 103  CO2 24  GLUCOSE 122*  BUN 15  CREATININE 0.67  CALCIUM 8.4*   LFT Recent Labs    11/22/19 1112  PROT 5.9*  ALBUMIN 3.7  AST 24  ALT 17  ALKPHOS 23*  BILITOT 0.5    Studies/Results: DG Chest 2 View  Result Date: 11/22/2019 CLINICAL DATA:  Shortness of breath. EXAM: CHEST - 2 VIEW COMPARISON:  11/14/2019 FINDINGS: The lungs are clear without focal pneumonia, edema, pneumothorax or pleural effusion. Interstitial markings are diffusely coarsened with chronic features. Cardiopericardial silhouette is at upper limits of normal for size. Mild vascular congestion without edema. Bones are diffusely demineralized. IMPRESSION: Borderline cardiomegaly with chronic interstitial coarsening. No acute cardiopulmonary findings. Electronically Signed   By: Misty Stanley M.D.   On: 11/22/2019 07:12    Impression: Pleasant 74 year old female with history of IDA s/p evaluation August 2020 in [place] by Dr. Carlean Purl, followed by Hematology requiring iron infusions, presenting with profound symptomatic anemia with Hgb 5.9. 2 units PRBCs have been ordered but not started yet. Reported melena, with last episode this morning. Hemoccult positive documented here in ED. Denying NSAIDs. No anticoagulation.   Incidentally, she was found to have acute, uncomplicated sigmoid diverticulitis on CT Aug 2021, s/p treatment as outpatient with Augmentin and now clinically resolved. At that time, she was already reporting black  stools  Recommend EGD with capsule study once she has had resuscitation, blood transfusion. May need to consider early  interval colonoscopy as it has been right at a year since last evaluation, but this would be performed as outpatient.     Plan: Clear liquids for now Anemia panel NPO after midnight Transfuse as needed Follow H/H IV BID PPI Will reassess in the morning   Annitta Needs, PhD, ANP-BC [facility] Gastroenterology      LOS: 0 days    11/22/2019, 11:48 AM

## 2019-11-22 NOTE — ED Notes (Signed)
Patient complaining of left hand aching.  States it has been aching for several hours to the point she can't take it any more.  Tylenol given for pain.

## 2019-11-22 NOTE — H&P (View-Only) (Signed)
Referring Provider: Nuala Alpha, PA-C, [facility] Primary Care Physician:  [name], DO Primary Gastroenterologist:  Dr. Carlean Purl  Date of Admission: 11/22/19 Date of Consultation: 11/22/19  Reason for Consultation:  Acute blood loss anemia  HPI:  Stacey Reyes is a 74 y.o. year old female with history of IDA, established with Dr. Carlean Purl (GI) and Hematology, receiving iron infusions intermittently, presenting with profound anemia, heme positive stools and reported melena. Admitting Hgb 5.9, previously 11.9 a week ago. 2 units PRBCs have been ordered but not started yet. Prior evaluation for IDA undertaken by Dr. Carlean Purl with EGD and colonoscopy Aug 2020. EGD findings of erosive duodenitis but negative for celiac, negative H.pylori. Colonoscopy with diverticulosis and 2 adenomas. No capsule study done, with plans to consider if recurrent IDA.   Patient states she presented to the ED with worsening shortness of breath over the weekend fatigue, leg weakness and feeling numb.  Last week, she had acute left-sided abdominal pain and was seen at [facility] with CT findings of uncomplicated sigmoid diverticulitis, fatty liver. Prescribed Augmentin.   Abdominal pain resolved now. This morning felt nauseated but no vomiting. No fever or chills. This week has had a poor appetite. No dysphagia. Has been seeing black stool all week. Last episode of black stool this morning. Not as loose this morning but was jet black. Denies NSAIDs, aspirin powders.   No PPI. No reflux. No oral iron.    Past Medical History:  Diagnosis Date  . Anemia   . Cataract    removed bilateral  . Chronic cystitis   . Heart murmur   . Hyperlipidemia   . Hypertension   . Personal history of colonic polyps-adenomas 01/07/2012   2009 - 2 diminutive adenomas (prior polyps also) 01/07/2012 - 2 diminutive adenomas      Past Surgical History:  Procedure Laterality Date  . BREAST BIOPSY Right     No Scar seen   . COLONOSCOPY  multiple    Prior to Admission medications   Medication Sig Start Date End Date Taking? Authorizing Provider  bisoprolol (ZEBETA) 5 MG tablet Take 0.5 tablets (2.5 mg total) by mouth daily. 03/23/19   Lendon Colonel, NP  cyanocobalamin ([employer] VITAMIN B12) 2000 MCG tablet Take 1 tablet (2,000 mcg total) by mouth daily. 03/25/19   Johnson, Clanford L, MD  ergocalciferol (VITAMIN D2) 1.25 MG (50000 UT) capsule Take 1 capsule (50,000 Units total) by mouth once a week. 08/26/19   Lockamy, Randi L, NP-C  lisinopril (ZESTRIL) 5 MG tablet Take 1 tablet (5 mg total) by mouth daily. 03/23/19 08/26/19  Lendon Colonel, NP  Magnesium 200 MG TABS Take 1 tablet (200 mg total) by mouth daily. 03/23/19   Lendon Colonel, NP  meclizine (ANTIVERT) 25 MG tablet Take 1 tablet (25 mg total) by mouth 3 (three) times daily as needed for dizziness. 05/05/19   Derek Jack, MD  Multiple Vitamin (MULTIVITAMIN) capsule Take 1 capsule by mouth daily.    [provider]  rosuvastatin (CRESTOR) 10 MG tablet TAKE 1 TABLET BY MOUTH AT  BEDTIME 05/14/19   Ronnie Doss M, DO  thiamine (VITAMIN B-1) 100 MG tablet Take 100 mg by mouth daily.    [provider]  vitamin E 400 UNIT capsule Take 400 Units by mouth daily.     [provider]    Current Facility-Administered Medications  Medication Dose Route Frequency Provider Last Rate Last Admin  .  0.9 %  sodium chloride infusion (Manually program via Guardrails IV Fluids)   Intravenous Once Deliah Boston, PA-C       Current Outpatient Medications  Medication Sig Dispense Refill  . bisoprolol (ZEBETA) 5 MG tablet Take 0.5 tablets (2.5 mg total) by mouth daily. 45 tablet 3  . cyanocobalamin ([employer] VITAMIN B12) 2000 MCG tablet Take 1 tablet (2,000 mcg total) by mouth daily.    . ergocalciferol (VITAMIN D2) 1.25 MG (50000 UT) capsule Take 1 capsule (50,000 Units total) by mouth once a week. 16 capsule  3  . lisinopril (ZESTRIL) 5 MG tablet Take 1 tablet (5 mg total) by mouth daily. 90 tablet 3  . Magnesium 200 MG TABS Take 1 tablet (200 mg total) by mouth daily. 30 tablet 6  . meclizine (ANTIVERT) 25 MG tablet Take 1 tablet (25 mg total) by mouth 3 (three) times daily as needed for dizziness. 30 tablet 1  . Multiple Vitamin (MULTIVITAMIN) capsule Take 1 capsule by mouth daily.    . rosuvastatin (CRESTOR) 10 MG tablet TAKE 1 TABLET BY MOUTH AT  BEDTIME 90 tablet 3  . thiamine (VITAMIN B-1) 100 MG tablet Take 100 mg by mouth daily.    . vitamin E 400 UNIT capsule Take 400 Units by mouth daily.       Allergies as of 11/22/2019 - Review Complete 11/22/2019  Allergen Reaction Noted  . Sulfa antibiotics Rash 04/29/2019    Family History  Problem Relation Age of Onset  . Colon cancer Mother 94       80's  . Breast cancer Sister   . Asthma Brother   . Colon polyps Neg Hx   . Esophageal cancer Neg Hx   . Rectal cancer Neg Hx   . Stomach cancer Neg Hx     Social History   Socioeconomic History  . Marital status: Married    Spouse name: Not on file  . Number of children: 1  . Years of education: Not on file  . Highest education level: Not on file  Occupational History  . Occupation: retired  Tobacco Use  . Smoking status: Former Smoker    Quit date: 12/23/1984    Years since quitting: 34.9  . Smokeless tobacco: Never Used  Vaping Use  . Vaping Use: Never used  Substance and Sexual Activity  . Alcohol use: Yes    Comment: rare  . Drug use: No  . Sexual activity: Not on file  Other Topics Concern  . Not on file  Social History Narrative   Patient is married and retired and has 1 grown child   Social Determinants of Radio broadcast assistant Strain:   . Difficulty of Paying Living Expenses: Not on file  Food Insecurity:   . Worried About Charity fundraiser in the Last Year: Not on file  . Ran Out of Food in the Last Year: Not on file  Transportation Needs:   . Lack  of Transportation (Medical): Not on file  . Lack of Transportation (Non-Medical): Not on file  Physical Activity:   . Days of Exercise per Week: Not on file  . Minutes of Exercise per Session: Not on file  Stress:   . Feeling of Stress : Not on file  Social Connections:   . Frequency of Communication with Friends and Family: Not on file  . Frequency of Social Gatherings with Friends and Family: Not on file  . Attends Religious Services: Not on file  .  Active Member of Clubs or Organizations: Not on file  . Attends Archivist Meetings: Not on file  . Marital Status: Not on file  Intimate Partner Violence:   . Fear of Current or Ex-Partner: Not on file  . Emotionally Abused: Not on file  . Physically Abused: Not on file  . Sexually Abused: Not on file    Review of Systems: Gen: see HPI CV: +chest discomfort if short of breath Resp: see HPI GI: see HPI GU : Denies urinary burning, urinary frequency, urinary incontinence.  MS: Denies joint pain,swelling, cramping Derm: Denies rash, itching, dry skin Psych: Denies depression, anxiety,confusion, or memory loss Heme: see HPI  Physical Exam: Vital signs in last 24 hours: Temp:  [98.2 F (36.8 C)] 98.2 F (36.8 C) (08/30 0645) Pulse Rate:  [96] 96 (08/30 0645) Resp:  [18] 18 (08/30 0645) BP: (132)/(57) 132/57 (08/30 0645) SpO2:  [100 %] 100 % (08/30 0645) Weight:  [92 kg] 92 kg (08/30 0644)   General:   Alert,  Well-developed, well-nourished, pleasant and cooperative in NAD, pale.  Head:  Normocephalic and atraumatic. Eyes:  Sclera clear, no icterus.    Ears:  Normal auditory acuity. Nose:  No deformity, discharge,  or lesions. Mouth:  No deformity or lesions, dentition normal. Lungs:  Clear throughout to auscultation.    Heart:  S1 S2 present with systolic murmur  Abdomen:  Soft, nontender and nondistended. No masses, hepatosplenomegaly or hernias noted. Normal bowel sounds, without guarding, and without rebound.    Rectal:  Deferred  Msk:  Symmetrical without gross deformities. Normal posture. Extremities:  Without edema. Neurologic:  Alert and  oriented x4 Psych:  Alert and cooperative. Normal mood and affect.  Intake/Output from previous day: No intake/output data recorded. Intake/Output this shift: No intake/output data recorded.  Lab Results: Recent Labs    11/22/19 1112  WBC 11.4*  HGB 5.9*  HCT 19.4*  PLT 311   BMET Recent Labs    11/22/19 1112  NA 135  K 3.7  CL 103  CO2 24  GLUCOSE 122*  BUN 15  CREATININE 0.67  CALCIUM 8.4*   LFT Recent Labs    11/22/19 1112  PROT 5.9*  ALBUMIN 3.7  AST 24  ALT 17  ALKPHOS 23*  BILITOT 0.5    Studies/Results: DG Chest 2 View  Result Date: 11/22/2019 CLINICAL DATA:  Shortness of breath. EXAM: CHEST - 2 VIEW COMPARISON:  11/14/2019 FINDINGS: The lungs are clear without focal pneumonia, edema, pneumothorax or pleural effusion. Interstitial markings are diffusely coarsened with chronic features. Cardiopericardial silhouette is at upper limits of normal for size. Mild vascular congestion without edema. Bones are diffusely demineralized. IMPRESSION: Borderline cardiomegaly with chronic interstitial coarsening. No acute cardiopulmonary findings. Electronically Signed   By: Misty Stanley M.D.   On: 11/22/2019 07:12    Impression: Pleasant 74 year old female with history of IDA s/p evaluation August 2020 in [place] by Dr. Carlean Purl, followed by Hematology requiring iron infusions, presenting with profound symptomatic anemia with Hgb 5.9. 2 units PRBCs have been ordered but not started yet. Reported melena, with last episode this morning. Hemoccult positive documented here in ED. Denying NSAIDs. No anticoagulation.   Incidentally, she was found to have acute, uncomplicated sigmoid diverticulitis on CT Aug 2021, s/p treatment as outpatient with Augmentin and now clinically resolved. At that time, she was already reporting black  stools  Recommend EGD with capsule study once she has had resuscitation, blood transfusion. May need to consider early  interval colonoscopy as it has been right at a year since last evaluation, but this would be performed as outpatient.     Plan: Clear liquids for now Anemia panel NPO after midnight Transfuse as needed Follow H/H IV BID PPI Will reassess in the morning   Annitta Needs, PhD, ANP-BC [facility] Gastroenterology      LOS: 0 days    11/22/2019, 11:48 AM

## 2019-11-22 NOTE — ED Provider Notes (Signed)
[facility] EMERGENCY DEPARTMENT Provider Note   CSN: [id number] Arrival date & time: 11/22/19  4970     History Chief Complaint  Patient presents with  . Shortness of Breath    Stacey Reyes is a 74 y.o. female history of hypertension, hyperlipidemia, obesity, osteopenia.  Patient presents today for exertional shortness of breath onset 4 days ago.  Patient reports she has difficulty catching her breath and has increased fatigue with any exertion even walking across the room.  She reports that this is happened in the past when she has had anemia requiring blood transfusion.  Patient reports that she has been experiencing black stools for the last 1 week since starting treatment for diverticulitis.  She reports she has been compliant with her antibiotic use and her diarrhea has gradually improved but now appears black.  Patient reports that she checked her blood pressure this morning and noticed it was low she called her primary care doctor who advised her not to take her morning medications and come to the ER for evaluation. Associated symptom mild central chest tightness with exertion and shortness of breath.  She denies fall/injury, fever/chills, headache, cough/hemoptysis, abdominal pain, dysuria/hematuria, extremity swelling/color change or any additional concerns.  HPI     Past Medical History:  Diagnosis Date  . Anemia   . Cataract    removed bilateral  . Chronic cystitis   . Heart murmur   . Hyperlipidemia   . Hypertension   . Personal history of colonic polyps-adenomas 01/07/2012   2009 - 2 diminutive adenomas (prior polyps also) 01/07/2012 - 2 diminutive adenomas      Patient Active Problem List   Diagnosis Date Noted  . Acute blood loss anemia 11/22/2019  . Educated about COVID-19 virus infection 07/05/2019  . SOB (shortness of breath) 07/05/2019  . B12 deficiency 03/25/2019  . Symptomatic anemia 03/24/2019  . Osteopenia after menopause 04/22/2018  . Cardiac  murmur due to mitral valve disorder 03/03/2018  . Heart murmur 03/03/2018  . LVH (left ventricular hypertrophy) 11/20/2015  . Arrhythmia 10/11/2015  . Essential hypertension 02/24/2015  . Hyperlipidemia 02/24/2015  . Personal history of colonic polyps-adenomas 01/07/2012    Past Surgical History:  Procedure Laterality Date  . BREAST BIOPSY Right    No Scar seen   . COLONOSCOPY  multiple     OB History   No obstetric history on file.     Family History  Problem Relation Age of Onset  . Colon cancer Mother 73       80's  . Breast cancer Sister   . Asthma Brother   . Colon polyps Neg Hx   . Esophageal cancer Neg Hx   . Rectal cancer Neg Hx   . Stomach cancer Neg Hx     Social History   Tobacco Use  . Smoking status: Former Smoker    Quit date: 12/23/1984    Years since quitting: 34.9  . Smokeless tobacco: Never Used  Vaping Use  . Vaping Use: Never used  Substance Use Topics  . Alcohol use: Yes    Comment: rare  . Drug use: No    Home Medications Prior to Admission medications   Medication Sig Start Date End Date Taking? Authorizing Provider  bisoprolol (ZEBETA) 5 MG tablet Take 0.5 tablets (2.5 mg total) by mouth daily. 03/23/19   Lendon Colonel, NP  cyanocobalamin ([employer] VITAMIN B12) 2000 MCG tablet Take 1 tablet (2,000 mcg total) by mouth daily. 03/25/19   Wynetta Emery,  Clanford L, MD  ergocalciferol (VITAMIN D2) 1.25 MG (50000 UT) capsule Take 1 capsule (50,000 Units total) by mouth once a week. 08/26/19   Lockamy, Randi L, NP-C  lisinopril (ZESTRIL) 5 MG tablet Take 1 tablet (5 mg total) by mouth daily. 03/23/19 08/26/19  Lendon Colonel, NP  Magnesium 200 MG TABS Take 1 tablet (200 mg total) by mouth daily. 03/23/19   Lendon Colonel, NP  meclizine (ANTIVERT) 25 MG tablet Take 1 tablet (25 mg total) by mouth 3 (three) times daily as needed for dizziness. 05/05/19   Derek Jack, MD  Multiple Vitamin (MULTIVITAMIN) capsule Take 1 capsule by mouth  daily.    [provider]  rosuvastatin (CRESTOR) 10 MG tablet TAKE 1 TABLET BY MOUTH AT  BEDTIME 05/14/19   Ronnie Doss M, DO  thiamine (VITAMIN B-1) 100 MG tablet Take 100 mg by mouth daily.    [provider]  vitamin E 400 UNIT capsule Take 400 Units by mouth daily.     [provider]    Allergies    Sulfa antibiotics  Review of Systems   Review of Systems Ten systems are reviewed and are negative for acute change except as noted in the HPI  Physical Exam Updated Vital Signs BP 102/65   Pulse 80   Temp 98.2 F (36.8 C)   Resp 16   Ht 5\' 6"  (1.676 m)   Wt 92 kg   SpO2 98%   BMI 32.74 kg/m   Physical Exam Constitutional:      General: She is not in acute distress.    Appearance: Normal appearance. She is well-developed. She is not ill-appearing or diaphoretic.  HENT:     Head: Normocephalic and atraumatic.  Eyes:     General: Vision grossly intact. Gaze aligned appropriately.     Pupils: Pupils are equal, round, and reactive to light.  Neck:     Trachea: Trachea and phonation normal.  Pulmonary:     Effort: Pulmonary effort is normal. No respiratory distress.  Abdominal:     General: There is no distension.     Palpations: Abdomen is soft.     Tenderness: There is no abdominal tenderness. There is no guarding or rebound.  Genitourinary:    Comments: Rectal examination chaperoned by Alva Garnet.  No external hemorrhoids bleeding or fissures.  Melena on exam.  No palpable internal abnormalities.  Normal rectal tone. Musculoskeletal:        General: Normal range of motion.     Cervical back: Normal range of motion.  Skin:    General: Skin is warm and dry.  Neurological:     Mental Status: She is alert.     GCS: GCS eye subscore is 4. GCS verbal subscore is 5. GCS motor subscore is 6.     Comments: Speech is clear and goal oriented, follows commands Major Cranial nerves without deficit, no facial droop Moves extremities without  ataxia, coordination intact  Psychiatric:        Behavior: Behavior normal.     ED Results / Procedures / Treatments   Labs (all labs ordered are listed, but only abnormal results are displayed) Labs Reviewed  CBC WITH DIFFERENTIAL/PLATELET - Abnormal; Notable for the following components:      Result Value   WBC 11.4 (*)    RBC 1.88 (*)    Hemoglobin 5.9 (*)    HCT 19.4 (*)    MCV 103.2 (*)    RDW 16.1 (*)  nRBC 1.0 (*)    Abs Immature Granulocytes 0.17 (*)    All other components within normal limits  COMPREHENSIVE METABOLIC PANEL - Abnormal; Notable for the following components:   Glucose, Bld 122 (*)    Calcium 8.4 (*)    Total Protein 5.9 (*)    Alkaline Phosphatase 23 (*)    All other components within normal limits  BRAIN NATRIURETIC PEPTIDE - Abnormal; Notable for the following components:   B Natriuretic Peptide 239.0 (*)    All other components within normal limits  POC OCCULT BLOOD, ED - Abnormal; Notable for the following components:   Fecal Occult Bld POSITIVE (*)    All other components within normal limits  TROPONIN I (HIGH SENSITIVITY) - Abnormal; Notable for the following components:   Troponin I (High Sensitivity) 141 (*)    All other components within normal limits  SARS CORONAVIRUS 2 BY RT PCR (HOSPITAL ORDER, [facility])  PREPARE RBC (CROSSMATCH)  TYPE AND SCREEN  TROPONIN I (HIGH SENSITIVITY)    EKG EKG Interpretation  Date/Time:  Monday November 22 2019 12:08:03 EDT Ventricular Rate:  90 PR Interval:    QRS Duration: 112 QT Interval:  410 QTC Calculation: 502 R Axis:   18 Text Interpretation: Sinus rhythm LVH with IVCD and secondary repol abnrm Prolonged QT interval Similar to prior. No STEMI Confirmed by Nanda Quinton [phone]) on 11/22/2019 12:24:40 PM   Radiology DG Chest 2 View  Result Date: 11/22/2019 CLINICAL DATA:  Shortness of breath. EXAM: CHEST - 2 VIEW COMPARISON:  11/14/2019 FINDINGS: The lungs are  clear without focal pneumonia, edema, pneumothorax or pleural effusion. Interstitial markings are diffusely coarsened with chronic features. Cardiopericardial silhouette is at upper limits of normal for size. Mild vascular congestion without edema. Bones are diffusely demineralized. IMPRESSION: Borderline cardiomegaly with chronic interstitial coarsening. No acute cardiopulmonary findings. Electronically Signed   By: Misty Stanley M.D.   On: 11/22/2019 07:12    Procedures .Critical Care Performed by: Deliah Boston, PA-C Authorized by: Deliah Boston, PA-C   Critical care provider statement:    Critical care time (minutes):  35   Critical care was necessary to treat or prevent imminent or life-threatening deterioration of the following conditions: Symptomatic Anemia.   Critical care was time spent personally by me on the following activities:  Evaluation of patient's response to treatment, examination of patient, ordering and performing treatments and interventions, ordering and review of laboratory studies, ordering and review of radiographic studies, pulse oximetry, re-evaluation of patient's condition, obtaining history from patient or surrogate, review of old charts, development of treatment plan with patient or surrogate and discussions with consultants   (including critical care time)  Medications Ordered in ED Medications  0.9 %  sodium chloride infusion (Manually program via Guardrails IV Fluids) (has no administration in time range)    ED Course  I have reviewed the triage vital signs and the nursing notes.  Pertinent labs & imaging results that were available during my care of the patient were reviewed by me and considered in my medical decision making (see chart for details).  Clinical Course as of Nov 21 1253  Mon Nov 22, 2019  1204 GI   [BM]    Clinical Course User Index [BM] Gari Crown   MDM Rules/Calculators/A&P                           Additional history  obtained from: 1. Nursing notes from this visit. 2. Review of electronic medical record.  Patient seen in the ER on November 14, 2019 diagnosis of sigmoid diverticulitis treated with Augmentin. ----------------------------------------------------- I ordered, reviewed and interpreted labs which include: Hemoccult positive, suspect GI bleed as source of anemia. CBC shows hemoglobin 5.9, six-point decreased from last week.  Mild leukocytosis of 11.4 improving from prior. High-sensitivity troponin I 141, suspect this is from demand secondary to anemia, low suspicion for ACS at this time.  Will obtain delta troponin.  EKG and case reviewed with Dr. Laverta Baltimore, no significant change of EKG from prior. BNP 239, improved from 8 months ago, patient does not appear fluid overloaded. CMP shows no emergent electrolyte derangement, AKI, LFT elevations or gap.  CXR:  IMPRESSION:  Borderline cardiomegaly with chronic interstitial coarsening. No  acute cardiopulmonary findings.   EKG: Sinus rhythm LVH with IVCD and secondary repol abnrm Prolonged QT interval Similar to prior. No STEMI Confirmed by Nanda Quinton [phone]) on 11/22/2019 12:24:40 PM - Patient reevaluated resting comfortably no acute distress vital signs stable on room air.  Discussed case and patient seen by gastroenterology, they advised a plan for scope tomorrow but would like patient to be admitted to hospitalist service.  Patient is agreeable for admission she has no further questions or concerns.  Risks versus benefits of blood transfusion discussed with patient and she elects to proceed with transfusion, she reports she has had blood transfusions in the past.  Discussed case with Dr. Manuella Ghazi, patient was accepted to hospitalist service.  Note: Portions of this report may have been transcribed using voice recognition software. Every effort was made to ensure accuracy; however, inadvertent computerized transcription errors may still  be present. Final Clinical Impression(s) / ED Diagnoses Final diagnoses:  Symptomatic anemia  Gastrointestinal hemorrhage, unspecified gastrointestinal hemorrhage type    Rx / DC Orders ED Discharge Orders    None       Gari Crown 11/22/19 1255    LongWonda Olds, MD 11/24/19 [phone]

## 2019-11-22 NOTE — ED Triage Notes (Addendum)
Pt with c/o SOB since Friday morning and states that her BP has been running "extremly low". Pt also states she was recently diagnosed with Diverticulitis and has been having diarrhea and "black stools".

## 2019-11-23 ENCOUNTER — Other Ambulatory Visit: Payer: Self-pay

## 2019-11-23 ENCOUNTER — Inpatient Hospital Stay (HOSPITAL_COMMUNITY): Payer: Medicare Other | Admitting: Certified Registered"

## 2019-11-23 ENCOUNTER — Encounter (HOSPITAL_COMMUNITY): Payer: Self-pay | Admitting: Internal Medicine

## 2019-11-23 ENCOUNTER — Encounter (HOSPITAL_COMMUNITY)
Admission: EM | Disposition: A | Discharge status: Home / Self Care | Payer: Self-pay | Source: Home / Self Care | Attending: Family Medicine

## 2019-11-23 DIAGNOSIS — K921 Melena: Secondary | ICD-10-CM

## 2019-11-23 HISTORY — PX: GIVENS CAPSULE STUDY: SHX5432

## 2019-11-23 HISTORY — PX: ESOPHAGOGASTRODUODENOSCOPY (EGD) WITH PROPOFOL: SHX5813

## 2019-11-23 LAB — CBC
HCT: 22.9 % — ABNORMAL LOW (ref 36.0–46.0)
Hemoglobin: 7.1 g/dL — ABNORMAL LOW (ref 12.0–15.0)
MCH: 30.2 pg (ref 26.0–34.0)
MCHC: 31 g/dL (ref 30.0–36.0)
MCV: 97.4 fL (ref 80.0–100.0)
Platelets: 244 10*3/uL (ref 150–400)
RBC: 2.35 MIL/uL — ABNORMAL LOW (ref 3.87–5.11)
RDW: 19.4 % — ABNORMAL HIGH (ref 11.5–15.5)
WBC: 8.1 10*3/uL (ref 4.0–10.5)
nRBC: 0.9 % — ABNORMAL HIGH (ref 0.0–0.2)

## 2019-11-23 LAB — COMPREHENSIVE METABOLIC PANEL
ALT: 16 U/L (ref 0–44)
AST: 23 U/L (ref 15–41)
Albumin: 3.1 g/dL — ABNORMAL LOW (ref 3.5–5.0)
Alkaline Phosphatase: 20 U/L — ABNORMAL LOW (ref 38–126)
Anion gap: 7 (ref 5–15)
BUN: 11 mg/dL (ref 8–23)
CO2: 25 mmol/L (ref 22–32)
Calcium: 8.2 mg/dL — ABNORMAL LOW (ref 8.9–10.3)
Chloride: 105 mmol/L (ref 98–111)
Creatinine, Ser: 0.64 mg/dL (ref 0.44–1.00)
GFR calc Af Amer: >60 mL/min (ref >60)
GFR calc non Af Amer: >60 mL/min (ref >60)
Glucose, Bld: 94 mg/dL (ref 70–99)
Potassium: 3.8 mmol/L (ref 3.5–5.1)
Sodium: 137 mmol/L (ref 135–145)
Total Bilirubin: 0.9 mg/dL (ref 0.3–1.2)
Total Protein: 5.2 g/dL — ABNORMAL LOW (ref 6.5–8.1)

## 2019-11-23 LAB — MAGNESIUM: Magnesium: 2.1 mg/dL (ref 1.7–2.4)

## 2019-11-23 SURGERY — ESOPHAGOGASTRODUODENOSCOPY (EGD) WITH PROPOFOL
Anesthesia: General

## 2019-11-23 MED ORDER — STERILE WATER FOR IRRIGATION IR SOLN
Status: DC | PRN
  Administered 2019-11-23: 1.5 mL

## 2019-11-23 MED ORDER — LIDOCAINE HCL (CARDIAC) PF 50 MG/5ML IV SOSY
PREFILLED_SYRINGE | INTRAVENOUS | Status: DC | PRN
  Administered 2019-11-23: 60 mg via INTRAVENOUS

## 2019-11-23 MED ORDER — PROPOFOL 10 MG/ML IV BOLUS
INTRAVENOUS | Status: AC
  Filled 2019-11-23: qty 80

## 2019-11-23 MED ORDER — SODIUM CHLORIDE 0.9 % IV SOLN: INTRAVENOUS | Status: DC

## 2019-11-23 MED ORDER — ONDANSETRON HCL 4 MG/2ML IJ SOLN
INTRAMUSCULAR | Status: AC
  Filled 2019-11-23: qty 2

## 2019-11-23 MED ORDER — LIDOCAINE VISCOUS HCL 2 % MT SOLN
15.0000 mL | Freq: Once | OROMUCOSAL | Status: AC
  Administered 2019-11-23: 15 mL via OROMUCOSAL

## 2019-11-23 MED ORDER — PHENYLEPHRINE 40 MCG/ML (10ML) SYRINGE FOR IV PUSH (FOR BLOOD PRESSURE SUPPORT)
PREFILLED_SYRINGE | INTRAVENOUS | Status: AC
  Filled 2019-11-23: qty 10

## 2019-11-23 MED ORDER — LACTATED RINGERS IV SOLN: Freq: Once | INTRAVENOUS | Status: AC

## 2019-11-23 MED ORDER — PROPOFOL 10 MG/ML IV BOLUS
INTRAVENOUS | Status: DC | PRN
  Administered 2019-11-23: 100 mg via INTRAVENOUS
  Administered 2019-11-23: 175 ug/kg/min via INTRAVENOUS

## 2019-11-23 MED ORDER — EPHEDRINE 5 MG/ML INJ
INTRAVENOUS | Status: AC
  Filled 2019-11-23: qty 10

## 2019-11-23 MED ORDER — LACTATED RINGERS IV SOLN: INTRAVENOUS | Status: DC | PRN

## 2019-11-23 MED ORDER — BISOPROLOL FUMARATE 5 MG PO TABS
2.5000 mg | ORAL_TABLET | Freq: Every day | ORAL | Status: DC
  Filled 2019-11-23 (×2): qty 1

## 2019-11-23 MED ORDER — LIDOCAINE VISCOUS HCL 2 % MT SOLN
OROMUCOSAL | Status: AC
  Filled 2019-11-23: qty 15

## 2019-11-23 MED ORDER — PROPOFOL 10 MG/ML IV BOLUS
INTRAVENOUS | Status: AC
  Filled 2019-11-23: qty 200

## 2019-11-23 MED ORDER — LIDOCAINE 2% (20 MG/ML) 5 ML SYRINGE
INTRAMUSCULAR | Status: AC
  Filled 2019-11-23: qty 30

## 2019-11-23 NOTE — Anesthesia Preprocedure Evaluation (Signed)
Anesthesia Evaluation  Patient identified by MRN, date of birth, ID band Patient awake    Reviewed: Allergy & Precautions, NPO status , Patient's Chart, lab work & pertinent test results  History of Anesthesia Complications Negative for: history of anesthetic complications  Airway Mallampati: III  TM Distance: >3 FB Neck ROM: Full    Dental  (+) Caps, Dental Advisory Given   Pulmonary former smoker,    Pulmonary exam normal breath sounds clear to auscultation       Cardiovascular Exercise Tolerance: Good hypertension, Pt. on medications Normal cardiovascular exam+ Valvular Problems/Murmurs  Rhythm:Regular Rate:Normal     Neuro/Psych negative neurological ROS  negative psych ROS   GI/Hepatic negative GI ROS, Neg liver ROS,   Endo/Other  negative endocrine ROS  Renal/GU negative Renal ROS  negative genitourinary   Musculoskeletal negative musculoskeletal ROS (+)   Abdominal   Peds  Hematology  (+) anemia ,   Anesthesia Other Findings   Reproductive/Obstetrics negative OB ROS                            Anesthesia Physical Anesthesia Plan  ASA: III  Anesthesia Plan: General   Post-op Pain Management:    Induction: Intravenous  PONV Risk Score and Plan: TIVA  Airway Management Planned: Nasal Cannula, Natural Airway and Simple Face Mask  Additional Equipment:   Intra-op Plan:   Post-operative Plan:   Informed Consent: I have reviewed the patients History and Physical, chart, labs and discussed the procedure including the risks, benefits and alternatives for the proposed anesthesia with the patient or authorized representative who has indicated his/her understanding and acceptance.    Discussed DNR with patient and Suspend DNR.   Dental advisory given  Plan Discussed with: CRNA and Surgeon  Anesthesia Plan Comments:        Anesthesia Quick Evaluation

## 2019-11-23 NOTE — Anesthesia Postprocedure Evaluation (Signed)
Anesthesia Post Note  Patient: Stacey Reyes  Procedure(s) Performed: ESOPHAGOGASTRODUODENOSCOPY (EGD) WITH PROPOFOL (N/A ) GIVENS CAPSULE STUDY (N/A )  Patient location during evaluation: PACU Anesthesia Type: General Level of consciousness: awake, oriented, awake and alert and patient cooperative Pain management: pain level controlled Vital Signs Assessment: post-procedure vital signs reviewed and stable Respiratory status: spontaneous breathing, respiratory function stable, nonlabored ventilation and patient connected to nasal cannula oxygen Cardiovascular status: blood pressure returned to baseline and stable Postop Assessment: no headache and no backache Anesthetic complications: no   No complications documented.   Last Vitals:  Vitals:   11/23/19 0416 11/23/19 0800  BP: 131/62 122/70  Pulse: 85   Resp: 18 18  Temp: 36.8 C 37.1 C  SpO2: 95% 95%    Last Pain:  Vitals:   11/23/19 0824  TempSrc:   PainSc: 2                  Tacy Learn

## 2019-11-23 NOTE — Transfer of Care (Signed)
Immediate Anesthesia Transfer of Care Note  Patient: Stacey Reyes  Procedure(s) Performed: ESOPHAGOGASTRODUODENOSCOPY (EGD) WITH PROPOFOL (N/A ) GIVENS CAPSULE STUDY (N/A )  Patient Location: PACU  Anesthesia Type:General  Level of Consciousness: awake, alert , oriented and patient cooperative  Airway & Oxygen Therapy: Patient Spontanous Breathing and Patient connected to nasal cannula oxygen  Post-op Assessment: Report given to RN, Post -op Vital signs reviewed and stable and Patient moving all extremities  Post vital signs: Reviewed and stable  Last Vitals:  Vitals Value Taken Time  BP    Temp    Pulse    Resp    SpO2      Last Pain:  Vitals:   11/23/19 0824  TempSrc:   PainSc: 2       Patients Stated Pain Goal: 5 (34/03/70 9643)  Complications: No complications documented.

## 2019-11-23 NOTE — Op Note (Signed)
[facility] Patient Name: Stacey Reyes Procedure Date: 11/23/2019 7:54 AM MRN: [id number] Date of Birth: 02/19/1946 Attending MD: Norvel Richards , MD CSN: [id number] Age: 74 Admit Type: Outpatient Procedure:                Upper GI endoscopy Indications:              Melena; IDA Providers:                Norvel Richards, MD, Janeece Riggers, RN, Lambert Mody, Casimer Bilis, Technician, Aram Candela Referring MD:              Medicines:                Propofol per Anesthesia Complications:            No immediate complications. Estimated Blood Loss:     Estimated blood loss: none. Procedure:                Pre-Anesthesia Assessment:                           - Prior to the procedure, a History and Physical                            was performed, and patient medications and                            allergies were reviewed. The patient's tolerance of                            previous anesthesia was also reviewed. The risks                            and benefits of the procedure and the sedation                            options and risks were discussed with the patient.                            All questions were answered, and informed consent                            was obtained. Prior Anticoagulants: The patient has                            taken no previous anticoagulant or antiplatelet                            agents. ASA Grade Assessment: II - A patient with  mild systemic disease. After reviewing the risks                            and benefits, the patient was deemed in                            satisfactory condition to undergo the procedure.                           After obtaining informed consent, the endoscope was                            passed under direct vision. Throughout the                            procedure, the patient's blood pressure,  pulse, and                            oxygen saturations were monitored continuously. The                            [id number]-[id number] ([id number]) scope was introduced through the                            mouth, and advanced to the second part of duodenum.                            The upper GI endoscopy was accomplished without                            difficulty. The patient tolerated the procedure                            well. Scope In: 8:27:29 AM Scope Out: 8:36:14 AM Total Procedure Duration: 0 hours 8 minutes 45 seconds  Findings:      The examined esophagus was normal.      A small hiatal hernia was present. Otherwise, normal-appearing stomach.       Patent pylorus. Couple of small innocent appearing superficial erosions       in the posterior bulb. Otherwise, D1, D2 and D3 appeared normal      No blood in the upper GI tract. Scope withdrawn. Capsule deployment       device attached. Capsule loaded up. Scope reintroduced in the stomach       and the capsule was deployed into the duodenum under endoscopic control. Impression:               - Normal esophagus. Small hiatal hernia. Couple of                            innocent appearing duodenal erosions.                           -Small bowel capsule deployed. Moderate Sedation:      Moderate (conscious) sedation was personally administered by an  anesthesia professional. The following parameters were monitored: oxygen       saturation, heart rate, blood pressure, respiratory rate, EKG, adequacy       of pulmonary ventilation, and response to care. Recommendation:           - Return patient to hospital ward for observation.                           - NPO; then clear liquids in 2 hours. Further                            recommendations to follow pending capsule data                            reviewed. Procedure Code(s):        --- Professional ---                           [phone], Esophagogastroduodenoscopy, flexible,                             transoral; diagnostic, including collection of                            specimen(s) by brushing or washing, when performed                            (separate procedure) Diagnosis Code(s):        --- Professional ---                           K44.9, Diaphragmatic hernia without obstruction or                            gangrene                           K92.1, Melena (includes Hematochezia) CPT copyright 2019 [employer]. All rights reserved. The codes documented in this report are preliminary and upon coder review may  be revised to meet current compliance requirements. Cristopher Estimable. Elaria Osias, MD Norvel Richards, MD 11/23/2019 8:50:28 AM This report has been signed electronically. Number of Addenda: 0

## 2019-11-23 NOTE — Progress Notes (Signed)
Patient Demographics:    Stacey Reyes, is a 74 y.o. female, DOB - 07-09-1945, IWL:[id number]  Admit date - 11/22/2019   Admitting Physician Pratik Darleen Crocker, DO  Outpatient Primary MD for the patient is [name], DO  LOS - 1   Chief Complaint  Patient presents with   Shortness of Breath        Subjective:    Stacey Reyes today has no fevers, no emesis,  No chest pain,   --Tolerating clear liquid diet okay post EGD -Patient with fatigue, dizziness and dyspnea on exertion  Assessment  & Plan :    Principal Problem:   Acute blood loss anemia Active Problems:   Essential hypertension   Symptomatic anemia  Brief Summary:- 74 y.o. female with medical history significant for hypertension, iron deficiency anemia, dyslipidemia, obesity, and recent diverticulitis with treatment on antibiotics admitted on 11/22/2019 with acute anemia due to presumed GI bleed with melena  A/p 1) acute symptomatic blood loss anemia--secondary to acute GI bleed/ABLA- -on admission hemoglobin was down to 5.9 from a baseline usually between 12 and 14 -Fatigue, dizziness and dyspnea on exertion persist-remains asymptomatic -Posttransfusion hemoglobin back up to 7.1 -EGD on 11/23/2019 without culprit lesions -Capsule endoscopy deployed -Per GI service okay for clear liquid diet and continue Protonix for now -Anemia work-up is unremarkable-specifically folate and B12 are not low, serum iron and ferritin are not low  2)HTN--- okay to restart bisoprolol,  lisinopril is on hold  Disposition/Need for in-Hospital Stay- patient unable to be discharged at this time due to --acute GI bleed requiring IV Protonix, pending results of capsule endoscopy, -Fatigue, dizziness and dyspnea on exertion persist-remains asymptomatic  Status is: Inpatient  Remains inpatient appropriate because:acute GI bleed requiring IV Protonix,  pending results of capsule endoscopy, -Fatigue, dizziness and dyspnea on exertion persist-remains asymptomatic   Disposition: The patient is from: Home              Anticipated d/c is to: Home              Anticipated d/c date is: 1 day              Patient currently is not medically stable to d/c. Barriers: Not Clinically Stable- acute GI bleed requiring IV Protonix, pending results of capsule endoscopy, -Fatigue, dizziness and dyspnea on exertion persist-remains asymptomatic  Code Status : full code  Family Communication:   (patient is alert, awake and coherent)   Consults  :  Gi  DVT Prophylaxis  :    - SCDs /gi bleed  Lab Results  Component Value Date   PLT 244 11/23/2019    Inpatient Medications  Scheduled Meds:  bisoprolol  2.5 mg Oral Daily   melatonin  6 mg Oral QHS   pantoprazole (PROTONIX) IV  40 mg Intravenous Q12H   sodium chloride flush  3 mL Intravenous Q12H   Continuous Infusions:  sodium chloride     PRN Meds:.sodium chloride, acetaminophen **OR** acetaminophen, ondansetron **OR** ondansetron (ZOFRAN) IV, sodium chloride flush    Anti-infectives (From admission, onward)   None        Objective:   Vitals:   11/23/19 0845 11/23/19 0900 11/23/19 0915 11/23/19 1426  BP: (!) 109/54 109/61 117/61  125/66  Pulse:  76 79 84  Resp: 18 14 15 16   Temp:    98.6 F (37 C)  TempSrc:    Oral  SpO2: 98% 98% 95% 95%  Weight:      Height:        Wt Readings from Last 3 Encounters:  11/22/19 91.7 kg  11/14/19 91.6 kg  09/23/19 91.4 kg     Intake/Output Summary (Last 24 hours) at 11/23/2019 1641 Last data filed at 11/23/2019 0834 Gross per 24 hour  Intake 1372.5 ml  Output 500 ml  Net 872.5 ml    Physical Exam  Gen:- Awake Alert,  In no apparent distress  HEENT:- [place].AT, No sclera icterus Neck-Supple Neck,No JVD,.  Lungs-  CTAB , fair symmetrical air movement CV- S1, S2 normal, regular  Abd-  +ve B.Sounds, Abd Soft, mild abdominal discomfort  without rebound or guarding,    Extremity/Skin:- No  edema, pedal pulses present  Psych-affect is appropriate, oriented x3 Neuro-generalized weakness, no new focal deficits, no tremors   Data Review:   Micro Results Recent Results (from the past 240 hour(s))  Urine culture     Status: Abnormal   Collection Time: 11/14/19  8:51 AM   Specimen: Urine, Clean Catch  Result Value Ref Range Status   Specimen Description   Final    URINE, CLEAN CATCH Performed at [facility], [address] [place], [place] [address]    Special Requests   Final    NONE Performed at [facility], [address] [place], [place] [address]    Culture (A)  Final    <10,000 COLONIES/mL INSIGNIFICANT GROWTH Performed at [facility], [address], [place], [place] [address]    Report Status 11/15/2019 FINAL  Final  SARS Coronavirus 2 by RT PCR (hospital order, performed in [facility] hospital lab) Nasopharyngeal Nasopharyngeal Swab     Status: None   Collection Time: 11/14/19  2:23 PM   Specimen: Nasopharyngeal Swab  Result Value Ref Range Status   SARS Coronavirus 2 NEGATIVE NEGATIVE Final    Comment: (NOTE) SARS-CoV-2 target nucleic acids are NOT DETECTED.  The SARS-CoV-2 RNA is generally detectable in upper and lower respiratory specimens during the acute phase of infection. The lowest concentration of SARS-CoV-2 viral copies this assay can detect is 250 copies / mL. A negative result does not preclude SARS-CoV-2 infection and should not be used as the sole basis for treatment or other patient management decisions.  A negative result may occur with improper specimen collection / handling, submission of specimen other than nasopharyngeal swab, presence of viral mutation(s) within the areas targeted by this assay, and inadequate number of viral copies (<250 copies / mL). A negative result must be combined with clinical observations, patient history, and epidemiological  information.  Fact Sheet for Patients:   StrictlyIdeas.no  Fact Sheet for Healthcare Providers: BankingDealers.co.za  This test is not yet approved or  cleared by the [place] FDA and has been authorized for detection and/or diagnosis of SARS-CoV-2 by FDA under an Emergency Use Authorization (EUA).  This EUA will remain in effect (meaning this test can be used) for the duration of the COVID-19 declaration under Section 564(b)(1) of the Act, 21 U.S.C. section 360bbb-3(b)(1), unless the authorization is terminated or revoked sooner.  Performed at [facility], [address] [place], [place] [address]   SARS Coronavirus 2 by RT PCR (hospital order, performed in  [facility] hospital lab) Nasopharyngeal Nasopharyngeal Swab     Status: None   Collection Time: 11/22/19 11:14 AM   Specimen: Nasopharyngeal Swab  Result Value Ref Range Status   SARS Coronavirus 2 NEGATIVE NEGATIVE Final    Comment: (NOTE) SARS-CoV-2 target nucleic acids are NOT DETECTED.  The SARS-CoV-2 RNA is generally detectable in upper and lower respiratory specimens during the acute phase of infection. The lowest concentration of SARS-CoV-2 viral copies this assay can detect is 250 copies / mL. A negative result does not preclude SARS-CoV-2 infection and should not be used as the sole basis for treatment or other patient management decisions.  A negative result may occur with improper specimen collection / handling, submission of specimen other than nasopharyngeal swab, presence of viral mutation(s) within the areas targeted by this assay, and inadequate number of viral copies (<250 copies / mL). A negative result must be combined with clinical observations, patient history, and epidemiological information.  Fact Sheet for Patients:   StrictlyIdeas.no  Fact Sheet for Healthcare  Providers: BankingDealers.co.za  This test is not yet approved or  cleared by the [place] FDA and has been authorized for detection and/or diagnosis of SARS-CoV-2 by FDA under an Emergency Use Authorization (EUA).  This EUA will remain in effect (meaning this test can be used) for the duration of the COVID-19 declaration under Section 564(b)(1) of the Act, 21 U.S.C. section 360bbb-3(b)(1), unless the authorization is terminated or revoked sooner.  Performed at [facility], [address], [place], [place] [address]     Radiology Reports DG Chest 2 View  Result Date: 11/22/2019 CLINICAL DATA:  Shortness of breath. EXAM: CHEST - 2 VIEW COMPARISON:  11/14/2019 FINDINGS: The lungs are clear without focal pneumonia, edema, pneumothorax or pleural effusion. Interstitial markings are diffusely coarsened with chronic features. Cardiopericardial silhouette is at upper limits of normal for size. Mild vascular congestion without edema. Bones are diffusely demineralized. IMPRESSION: Borderline cardiomegaly with chronic interstitial coarsening. No acute cardiopulmonary findings. Electronically Signed   By: Misty Stanley M.D.   On: 11/22/2019 07:12   CT Abdomen Pelvis W Contrast  Result Date: 11/14/2019 CLINICAL DATA:  Abdominal pain and distension. EXAM: CT ABDOMEN AND PELVIS WITH CONTRAST TECHNIQUE: Multidetector CT imaging of the abdomen and pelvis was performed using the standard protocol following bolus administration of intravenous contrast. CONTRAST:  134mL OMNIPAQUE IOHEXOL 300 MG/ML  SOLN COMPARISON:  None. FINDINGS: Lower chest: No acute abnormality. Hepatobiliary: There is diffuse fatty infiltration of the liver parenchyma. No focal liver abnormality is seen. No gallstones, gallbladder wall thickening, or biliary dilatation. Pancreas: Unremarkable. No pancreatic ductal dilatation or surrounding inflammatory changes. Spleen: Normal in size without focal abnormality.  Adrenals/Urinary Tract: Adrenal glands are unremarkable. Kidneys are normal, without renal calculi, focal lesion, or hydronephrosis. Bladder is unremarkable. Stomach/Bowel: Stomach is within normal limits. Appendix appears normal. Markedly inflamed diverticula are seen within the mid sigmoid colon. A moderate to marked amount of associated Peri colonic inflammatory fat stranding is seen. There is no evidence of associated perforation or abscess. Vascular/Lymphatic: No significant vascular findings are present. No enlarged abdominal or pelvic lymph nodes. Reproductive: Uterus and bilateral adnexa are unremarkable. Other: No abdominal wall hernia or abnormality. No abdominopelvic ascites. Musculoskeletal: Moderate to marked severity multilevel degenerative changes seen throughout the lumbar spine. IMPRESSION: 1. Sigmoid diverticulitis without evidence of associated perforation or abscess. 2. Hepatic steatosis. Electronically Signed   By: Virgina Norfolk M.D.   On: 11/14/2019 15:37   DG Chest [facility]  1 View  Result Date: 11/14/2019 CLINICAL DATA:  Chest pain. EXAM: PORTABLE CHEST 1 VIEW COMPARISON:  March 09, 2019 FINDINGS: There is no evidence of acute infiltrate, pleural effusion or pneumothorax. The cardiac silhouette is borderline in size. Multilevel degenerative changes seen throughout the thoracic spine. IMPRESSION: No active disease. Electronically Signed   By: Virgina Norfolk M.D.   On: 11/14/2019 15:18     CBC Recent Labs  Lab 11/22/19 1112 11/23/19 0554  WBC 11.4* 8.1  HGB 5.9* 7.1*  HCT 19.4* 22.9*  PLT 311 244  MCV 103.2* 97.4  MCH 31.4 30.2  MCHC 30.4 31.0  RDW 16.1* 19.4*  LYMPHSABS 3.0  --   MONOABS 0.9  --   EOSABS 0.2  --   BASOSABS 0.1  --     Chemistries  Recent Labs  Lab 11/22/19 1112 11/23/19 0554  NA 135 137  K 3.7 3.8  CL 103 105  CO2 24 25  GLUCOSE 122* 94  BUN 15 11  CREATININE 0.67 0.64  CALCIUM 8.4* 8.2*  MG  --  2.1  AST 24 23  ALT 17 16  ALKPHOS  23* 20*  BILITOT 0.5 0.9   ------------------------------------------------------------------------------------------------------------------ No results for input(s): CHOL, HDL, LDLCALC, TRIG, CHOLHDL, LDLDIRECT in the last 72 hours.  Lab Results  Component Value Date   HGBA1C 5.6 02/24/2015   ------------------------------------------------------------------------------------------------------------------ No results for input(s): TSH, T4TOTAL, T3FREE, THYROIDAB in the last 72 hours.  Invalid input(s): FREET3 ------------------------------------------------------------------------------------------------------------------ Recent Labs    11/22/19 1408  VITAMINB12 380  FOLATE 7.4  FERRITIN 57  TIBC 352  IRON 70  RETICCTPCT 11.5*    Coagulation profile No results for input(s): INR, PROTIME in the last 168 hours.  No results for input(s): DDIMER in the last 72 hours.  Cardiac Enzymes No results for input(s): CKMB, TROPONINI, MYOGLOBIN in the last 168 hours.  Invalid input(s): CK ------------------------------------------------------------------------------------------------------------------    Component Value Date/Time   BNP 239.0 (H) 11/22/2019 1112     Roxan Hockey M.D on 11/23/2019 at 4:41 PM  Go to [email or web address] - for contact info  [facility] - Office  [phone]

## 2019-11-23 NOTE — Interval H&P Note (Signed)
History and Physical Interval Note:  11/23/2019 8:09 AM  Stacey Reyes  has presented today for surgery, with the diagnosis of acute blood loss anemia, melena, heme positive stool.  The various methods of treatment have been discussed with the patient and family. After consideration of risks, benefits and other options for treatment, the patient has consented to  Procedure(s): ESOPHAGOGASTRODUODENOSCOPY (EGD) WITH PROPOFOL (N/A) GIVENS CAPSULE STUDY (N/A) as a surgical intervention.  The patient's history has been reviewed, patient examined, no change in status, stable for surgery.  I have reviewed the patient's chart and labs.  Questions were answered to the patient's satisfaction.     Manus Rudd   Patient seen and examined in short stay.  Discussed with anesthesia.  Follow-up hemoglobin 7.1 post 1 unit of packed RBCs.  EGD today with possible capsule deployment per plan. The risks, benefits, limitations, alternatives and imponderables have been reviewed with the patient. Potential for esophageal dilation, biopsy, etc. have also been reviewed.  Questions have been answered. All parties agreeable.

## 2019-11-24 ENCOUNTER — Encounter (HOSPITAL_COMMUNITY): Payer: Self-pay | Admitting: Internal Medicine

## 2019-11-24 ENCOUNTER — Telehealth: Payer: Self-pay | Admitting: Gastroenterology

## 2019-11-24 DIAGNOSIS — D649 Anemia, unspecified: Secondary | ICD-10-CM

## 2019-11-24 LAB — CBC
HCT: 22.3 % — ABNORMAL LOW (ref 36.0–46.0)
Hemoglobin: 7 g/dL — ABNORMAL LOW (ref 12.0–15.0)
MCH: 30.6 pg (ref 26.0–34.0)
MCHC: 31.4 g/dL (ref 30.0–36.0)
MCV: 97.4 fL (ref 80.0–100.0)
Platelets: 266 10*3/uL (ref 150–400)
RBC: 2.29 MIL/uL — ABNORMAL LOW (ref 3.87–5.11)
RDW: 19.5 % — ABNORMAL HIGH (ref 11.5–15.5)
WBC: 6.6 10*3/uL (ref 4.0–10.5)
nRBC: 0.3 % — ABNORMAL HIGH (ref 0.0–0.2)

## 2019-11-24 LAB — BASIC METABOLIC PANEL
Anion gap: 8 (ref 5–15)
BUN: 8 mg/dL (ref 8–23)
CO2: 25 mmol/L (ref 22–32)
Calcium: 8.4 mg/dL — ABNORMAL LOW (ref 8.9–10.3)
Chloride: 104 mmol/L (ref 98–111)
Creatinine, Ser: 0.65 mg/dL (ref 0.44–1.00)
GFR calc Af Amer: >60 mL/min (ref >60)
GFR calc non Af Amer: >60 mL/min (ref >60)
Glucose, Bld: 98 mg/dL (ref 70–99)
Potassium: 3.8 mmol/L (ref 3.5–5.1)
Sodium: 137 mmol/L (ref 135–145)

## 2019-11-24 LAB — PREPARE RBC (CROSSMATCH)

## 2019-11-24 MED ORDER — ACETAMINOPHEN 325 MG PO TABS: 650.0000 mg | ORAL_TABLET | Freq: Four times a day (QID) | ORAL | 0 refills | Status: DC | PRN

## 2019-11-24 MED ORDER — SODIUM CHLORIDE 0.9% IV SOLUTION: Freq: Once | INTRAVENOUS | Status: DC

## 2019-11-24 MED ORDER — BISOPROLOL FUMARATE 5 MG PO TABS
2.5000 mg | ORAL_TABLET | Freq: Every day | ORAL | 3 refills | Status: DC
Start: 2019-11-24 — End: 2020-01-18

## 2019-11-24 MED ORDER — FUROSEMIDE 10 MG/ML IJ SOLN
20.0000 mg | Freq: Once | INTRAMUSCULAR | Status: AC
  Administered 2019-11-24: 20 mg via INTRAVENOUS
  Filled 2019-11-24: qty 2

## 2019-11-24 MED ORDER — ONDANSETRON HCL 4 MG PO TABS: 4.0000 mg | ORAL_TABLET | Freq: Four times a day (QID) | ORAL | 0 refills | Status: DC | PRN

## 2019-11-24 MED ORDER — PANTOPRAZOLE SODIUM 40 MG PO TBEC: 40.0000 mg | DELAYED_RELEASE_TABLET | Freq: Every day | ORAL | 1 refills | Status: DC

## 2019-11-24 MED ORDER — BISOPROLOL FUMARATE 5 MG PO TABS
2.5000 mg | ORAL_TABLET | Freq: Every day | ORAL | 3 refills | Status: DC
Start: 2019-11-24 — End: 2019-11-24

## 2019-11-24 NOTE — Care Management Important Message (Signed)
Important Message  Patient Details  Name: Stacey Reyes MRN: [id number] Date of Birth: Nov 24, 1945   Medicare Important Message Given:  Yes     Tommy Medal 11/24/2019, 4:29 PM

## 2019-11-24 NOTE — Progress Notes (Signed)
Nsg Discharge Note  Admit Date:  11/22/2019 Discharge date: 11/24/2019   Stacey Reyes to be D/C'd home per MD order.  AVS completed.  Copy for chart, and copy for patient signed, and dated. Patient/caregiver able to verbalize understanding.  Discharge Medication: Allergies as of 11/24/2019      Reactions   Sulfa Antibiotics Rash      Medication List    STOP taking these medications   amoxicillin-clavulanate 875-125 MG tablet Commonly known as: AUGMENTIN   lisinopril 5 MG tablet Commonly known as: ZESTRIL     TAKE these medications   acetaminophen 325 MG tablet Commonly known as: TYLENOL Take 2 tablets (650 mg total) by mouth every 6 (six) hours as needed for mild pain (or Fever >/= 101).   bisoprolol 5 MG tablet Commonly known as: ZEBETA Take 0.5 tablets (2.5 mg total) by mouth daily.   COPPER CAPS PO Take 1 capsule by mouth daily.   cyanocobalamin 2000 MCG tablet Commonly known as: [employer] VITAMIN B12 Take 1 tablet (2,000 mcg total) by mouth daily.   ergocalciferol 1.25 MG (50000 UT) capsule Commonly known as: VITAMIN D2 Take 1 capsule (50,000 Units total) by mouth once a week.   Magnesium 200 MG Tabs Take 1 tablet (200 mg total) by mouth daily.   meclizine 25 MG tablet Commonly known as: ANTIVERT Take 1 tablet (25 mg total) by mouth 3 (three) times daily as needed for dizziness.   MELATONIN PO Take 1 tablet by mouth daily.   multivitamin capsule Take 1 capsule by mouth daily.   ondansetron 4 MG tablet Commonly known as: ZOFRAN Take 1 tablet (4 mg total) by mouth every 6 (six) hours as needed for nausea.   pantoprazole 40 MG tablet Commonly known as: Protonix Take 1 tablet (40 mg total) by mouth daily.   rosuvastatin 10 MG tablet Commonly known as: CRESTOR TAKE 1 TABLET BY MOUTH AT  BEDTIME What changed: when to take this   thiamine 100 MG tablet Commonly known as: Vitamin B-1 Take 100 mg by mouth daily.   vitamin E 180 MG (400 UNITS)  capsule Take 400 Units by mouth daily.       Discharge Assessment: Vitals:   11/24/19 1436 11/24/19 1555  BP: 140/71 136/73  Pulse: 65 68  Resp: 19 16  Temp: 98.4 F (36.9 C) 98.5 F (36.9 C)  SpO2: 99% 97%   Skin clean, dry and intact without evidence of skin break down, no evidence of skin tears noted. IV catheter discontinued intact. Site without signs and symptoms of complications - no redness or edema noted at insertion site, patient denies c/o pain - only slight tenderness at site.  Dressing with slight pressure applied.  D/c Instructions-Education: Discharge instructions given to patient/family with verbalized understanding. D/c education completed with patient/family including follow up instructions, medication list, d/c activities limitations if indicated, with other d/c instructions as indicated by MD - patient able to verbalize understanding, all questions fully answered. Patient instructed to return to ED, call 911, or call MD for any changes in condition.  Patient escorted via [place], and D/C home via private auto.  Venita Sheffield, RN 11/24/2019 4:44 PM

## 2019-11-24 NOTE — Telephone Encounter (Signed)
Lmom, waiting on a return call.  

## 2019-11-24 NOTE — Plan of Care (Signed)

## 2019-11-24 NOTE — Discharge Summary (Signed)
Stacey Reyes, is a 74 y.o. female  DOB Oct 07, 1945  MRN [id number].  Admission date:  11/22/2019  Admitting Physician  Rodena Goldmann, DO  Discharge Date:  11/24/2019   Primary MD  [name], DO  Recommendations for primary care physician for things to follow:   1)Avoid ibuprofen/Advil/Aleve/Motrin/Goody Powders/Naproxen/BC powders/Meloxicam/Diclofenac/Indomethacin and other Nonsteroidal anti-inflammatory medications as these will make you more likely to bleed and can cause stomach ulcers, can also cause Kidney problems.   2)Repeat CBC this Friday 11/26/19  3) follow-up with Dr. Gala Romney and Dr Geisner----gastroenterologist as advised  4) call if persistent vomiting, dark stools, bright red blood in your stool, or vomiting blood  Admission Diagnosis  Acute blood loss anemia [D62] Symptomatic anemia [D64.9] Gastrointestinal hemorrhage, unspecified gastrointestinal hemorrhage type [K92.2]   Discharge Diagnosis  Acute blood loss anemia [D62] Symptomatic anemia [D64.9] Gastrointestinal hemorrhage, unspecified gastrointestinal hemorrhage type [K92.2]    Principal Problem:   Acute blood loss anemia Active Problems:   Essential hypertension   Symptomatic anemia      Past Medical History:  Diagnosis Date  . Anemia   . Cataract    removed bilateral  . Chronic cystitis   . Heart murmur   . Hyperlipidemia   . Hypertension   . Personal history of colonic polyps-adenomas 01/07/2012   2009 - 2 diminutive adenomas (prior polyps also) 01/07/2012 - 2 diminutive adenomas      Past Surgical History:  Procedure Laterality Date  . BREAST BIOPSY Right    No Scar seen   . COLONOSCOPY  multiple  . ESOPHAGOGASTRODUODENOSCOPY (EGD) WITH PROPOFOL N/A 11/23/2019   Procedure: ESOPHAGOGASTRODUODENOSCOPY (EGD) WITH PROPOFOL;  Surgeon: Daneil Dolin, MD;  [facility];  Service: Endoscopy;   Laterality: N/A;  . GIVENS CAPSULE STUDY N/A 11/23/2019   Procedure: GIVENS CAPSULE STUDY;  Surgeon: Daneil Dolin, MD;  Location: [facility];  Service: Endoscopy;  Laterality: N/A;     HPI  from the history and physical done on the day of admission:    Chief Complaint: SOB/dark strools  HPI: Stacey Reyes is a 74 y.o. female with medical history significant for hypertension, iron deficiency anemia, dyslipidemia, obesity, and recent diverticulitis with treatment on antibiotics who presented to the ED with shortness of breath particularly with exertion as well as ongoing melanotic stools and noted low blood pressures this morning.  Patient states that approximately a week ago, she was seen in [facility] for concern of abdominal pain on her lower left side.  CT findings demonstrated some uncomplicated sigmoid diverticulitis and she was prescribed Augmentin which she has been taking with improvement in abdominal pain.  She states that on Friday of last week she began to have some very dark stools and started to notice some shortness of breath particularly with exertion.  She denies any chest pain, diaphoresis, nausea, vomiting, fever, or chills.  She denies any use of NSAIDs, aspirin, but does state that she drinks wine on an occasional basis.  She states  that this morning she was having some worsening fatigue and shortness of breath and was also noted to have low blood pressure readings.  She called her PCP who told her not to take her home blood pressure medications and to present to the ED for further evaluation.   ED Course: Vital signs stable and patient is afebrile.  Hemoglobin noted be 5.9 compared to 11.9 noted just 1 week prior.  She is noted to have mild leukocytosis of 11,400.  She is currently on room air with test negative.  2 units of PRBCs have been ordered for transfusion.  She denies any significant pain at this time.  Review of Systems: All others reviewed as noted above and  otherwise negative    Hospital Course:    Brief Summary:- 74 y.o.femalewith medical history significant forhypertension,iron deficiency anemia,dyslipidemia,obesity, and recent diverticulitiswith treatment on antibiotics admitted on 11/22/2019 with acute anemia due to presumed GI bleed with melena  A/p 1) acute symptomatic blood loss anemia--secondary to acute GI bleed/ABLA- -on admission hemoglobin was down to 5.9 from a baseline usually between 12 and 14 -Fatigue, dizziness and dyspnea on exertion persist-remains asymptomatic -Posttransfusion hemoglobin back up to 7, patient received additional unit of PRBC on 11/24/2019---repeat CBC as outpatient with GI service on 11/26/2019 advised -Outpatient follow-up with GI for colonoscopy recommended -EGD on 11/23/2019 without culprit lesions -Capsule endoscopy deployed on 11/23/2019---findings of capsule endoscopy suggest possible lymphangiectasias noted in small bowel. Obvious old blood in cecum. -notable old blood mixed with some lighter dark red blood in colon just after capsule entered cecum on multiple images. No obvious lesion noted and query diverticular origin. Duodenal erosions noted but would not account for significant acute blood loss anemia, and no obvious old blood was seen in small bowel. Interesting small bowel variants as noted above were seen, which could be evaluated further with CTE in an elective setting.  -Per GI service okay to discharge home today  -Anemia work-up is unremarkable-specifically folate and B12 are not low, serum iron and ferritin are not low  2)HTN---  restart bisoprolol,   Disposition/--- Home with outpatient GI follow-up  Code Status : full code  Family Communication:   (patient is alert, awake and coherent)   Consults  :  Gi  Discharge Condition: stable  Follow UP   Follow-up Information    Rourk, Cristopher Estimable, MD. Schedule an appointment as soon as possible for a visit in 1 week(s).     Specialty: Gastroenterology Contact information: [place] [address] [phone]        Stacey Needs, NP. Schedule an appointment as soon as possible for a visit in 1 week(s).   Specialty: Gastroenterology Contact information: [address] [place] [address] [phone]               Diet and Activity recommendation:  As advised  Discharge Instructions     Discharge Instructions    Call MD for:  difficulty breathing, headache or visual disturbances   Complete by: As directed    Call MD for:  extreme fatigue   Complete by: As directed    Call MD for:  persistant dizziness or light-headedness   Complete by: As directed    Call MD for:  persistant nausea and vomiting   Complete by: As directed    Call MD for:  severe uncontrolled pain   Complete by: As directed    Call MD for:  temperature >100.4   Complete by: As directed  Diet - low sodium heart healthy   Complete by: As directed    Discharge instructions   Complete by: As directed    1)Avoid ibuprofen/Advil/Aleve/Motrin/Goody Powders/Naproxen/BC powders/Meloxicam/Diclofenac/Indomethacin and other Nonsteroidal anti-inflammatory medications as these will make you more likely to bleed and can cause stomach ulcers, can also cause Kidney problems.   2)Repeat CBC this Friday 11/26/19  3) follow-up with Dr. Gala Romney and Dr Geisner----gastroenterologist as advised  4) call if persistent vomiting, dark stools, bright red blood in your stool, or vomiting blood   Increase activity slowly   Complete by: As directed         Discharge Medications     Allergies as of 11/24/2019      Reactions   Sulfa Antibiotics Rash      Medication List    STOP taking these medications   amoxicillin-clavulanate 875-125 MG tablet Commonly known as: AUGMENTIN   lisinopril 5 MG tablet Commonly known as: ZESTRIL     TAKE these medications   acetaminophen 325 MG tablet Commonly known as: TYLENOL Take  2 tablets (650 mg total) by mouth every 6 (six) hours as needed for mild pain (or Fever >/= 101).   bisoprolol 5 MG tablet Commonly known as: ZEBETA Take 0.5 tablets (2.5 mg total) by mouth daily.   COPPER CAPS PO Take 1 capsule by mouth daily.   cyanocobalamin 2000 MCG tablet Commonly known as: [employer]S VITAMIN B12 Take 1 tablet (2,000 mcg total) by mouth daily.   ergocalciferol 1.25 MG (50000 UT) capsule Commonly known as: VITAMIN D2 Take 1 capsule (50,000 Units total) by mouth once a week.   Magnesium 200 MG Tabs Take 1 tablet (200 mg total) by mouth daily.   meclizine 25 MG tablet Commonly known as: ANTIVERT Take 1 tablet (25 mg total) by mouth 3 (three) times daily as needed for dizziness.   MELATONIN PO Take 1 tablet by mouth daily.   multivitamin capsule Take 1 capsule by mouth daily.   ondansetron 4 MG tablet Commonly known as: ZOFRAN Take 1 tablet (4 mg total) by mouth every 6 (six) hours as needed for nausea.   pantoprazole 40 MG tablet Commonly known as: Protonix Take 1 tablet (40 mg total) by mouth daily.   rosuvastatin 10 MG tablet Commonly known as: CRESTOR TAKE 1 TABLET BY MOUTH AT  BEDTIME What changed: when to take this   thiamine 100 MG tablet Commonly known as: Vitamin B-1 Take 100 mg by mouth daily.   vitamin E 180 MG (400 UNITS) capsule Take 400 Units by mouth daily.       Major procedures and Radiology Reports - PLEASE review detailed and final reports for all details, in brief -    DG Chest 2 View  Result Date: 11/22/2019 CLINICAL DATA:  Shortness of breath. EXAM: CHEST - 2 VIEW COMPARISON:  11/14/2019 FINDINGS: The lungs are clear without focal pneumonia, edema, pneumothorax or pleural effusion. Interstitial markings are diffusely coarsened with chronic features. Cardiopericardial silhouette is at upper limits of normal for size. Mild vascular congestion without edema. Bones are diffusely demineralized. IMPRESSION: Borderline cardiomegaly  with chronic interstitial coarsening. No acute cardiopulmonary findings. Electronically Signed   By: Misty Stanley M.D.   On: 11/22/2019 07:12   CT Abdomen Pelvis W Contrast  Result Date: 11/14/2019 CLINICAL DATA:  Abdominal pain and distension. EXAM: CT ABDOMEN AND PELVIS WITH CONTRAST TECHNIQUE: Multidetector CT imaging of the abdomen and pelvis was performed using the standard protocol following bolus administration of intravenous contrast.  CONTRAST:  145mL OMNIPAQUE IOHEXOL 300 MG/ML  SOLN COMPARISON:  None. FINDINGS: Lower chest: No acute abnormality. Hepatobiliary: There is diffuse fatty infiltration of the liver parenchyma. No focal liver abnormality is seen. No gallstones, gallbladder wall thickening, or biliary dilatation. Pancreas: Unremarkable. No pancreatic ductal dilatation or surrounding inflammatory changes. Spleen: Normal in size without focal abnormality. Adrenals/Urinary Tract: Adrenal glands are unremarkable. Kidneys are normal, without renal calculi, focal lesion, or hydronephrosis. Bladder is unremarkable. Stomach/Bowel: Stomach is within normal limits. Appendix appears normal. Markedly inflamed diverticula are seen within the mid sigmoid colon. A moderate to marked amount of associated Peri colonic inflammatory fat stranding is seen. There is no evidence of associated perforation or abscess. Vascular/Lymphatic: No significant vascular findings are present. No enlarged abdominal or pelvic lymph nodes. Reproductive: Uterus and bilateral adnexa are unremarkable. Other: No abdominal wall hernia or abnormality. No abdominopelvic ascites. Musculoskeletal: Moderate to marked severity multilevel degenerative changes seen throughout the lumbar spine. IMPRESSION: 1. Sigmoid diverticulitis without evidence of associated perforation or abscess. 2. Hepatic steatosis. Electronically Signed   By: Virgina Norfolk M.D.   On: 11/14/2019 15:37   DG Chest Port 1 View  Result Date: 11/14/2019 CLINICAL  DATA:  Chest pain. EXAM: PORTABLE CHEST 1 VIEW COMPARISON:  March 09, 2019 FINDINGS: There is no evidence of acute infiltrate, pleural effusion or pneumothorax. The cardiac silhouette is borderline in size. Multilevel degenerative changes seen throughout the thoracic spine. IMPRESSION: No active disease. Electronically Signed   By: Virgina Norfolk M.D.   On: 11/14/2019 15:18    Micro Results   Recent Results (from the past 240 hour(s))  SARS Coronavirus 2 by RT PCR (hospital order, performed in [facility] hospital lab) Nasopharyngeal Nasopharyngeal Swab     Status: None   Collection Time: 11/22/19 11:14 AM   Specimen: Nasopharyngeal Swab  Result Value Ref Range Status   SARS Coronavirus 2 NEGATIVE NEGATIVE Final    Comment: (NOTE) SARS-CoV-2 target nucleic acids are NOT DETECTED.  The SARS-CoV-2 RNA is generally detectable in upper and lower respiratory specimens during the acute phase of infection. The lowest concentration of SARS-CoV-2 viral copies this assay can detect is 250 copies / mL. A negative result does not preclude SARS-CoV-2 infection and should not be used as the sole basis for treatment or other patient management decisions.  A negative result may occur with improper specimen collection / handling, submission of specimen other than nasopharyngeal swab, presence of viral mutation(s) within the areas targeted by this assay, and inadequate number of viral copies (<250 copies / mL). A negative result must be combined with clinical observations, patient history, and epidemiological information.  Fact Sheet for Patients:   StrictlyIdeas.no  Fact Sheet for Healthcare Providers: BankingDealers.co.za  This test is not yet approved or  cleared by the [place] FDA and has been authorized for detection and/or diagnosis of SARS-CoV-2 by FDA under an Emergency Use Authorization (EUA).  This EUA will remain in effect (meaning  this test can be used) for the duration of the COVID-19 declaration under Section 564(b)(1) of the Act, 21 U.S.C. section 360bbb-3(b)(1), unless the authorization is terminated or revoked sooner.  Performed at [facility], [address] [place], [place] [address]        Today   Subjective    Stacey Reyes today has no new complaints No fever  Or chills   No Nausea, Vomiting or Diarrhea  --- Tolerating oral intake well, patient eager to go home  Patient has been seen and examined prior to discharge   Objective   Blood pressure 140/71, pulse 65, temperature 98.4 F (36.9 C), temperature source Oral, resp. rate 19, height 5\' 6"  (1.676 m), weight 91.7 kg, SpO2 99 %.   Intake/Output Summary (Last 24 hours) at 11/24/2019 1540 Last data filed at 11/24/2019 1248 Gross per 24 hour  Intake 315 ml  Output --  Net 315 ml    Exam Gen:- Awake Alert, no acute distress  HEENT:- [place].AT, No sclera icterus Neck-Supple Neck,No JVD,.  Lungs-  CTAB , good air movement bilaterally  CV- S1, S2 normal, regular Abd-  +ve B.Sounds, Abd Soft, No tenderness,    Extremity/Skin:- No  edema,   good pulses Psych-affect is appropriate, oriented x3 Neuro-no new focal deficits, no tremors    Data Review   CBC w Diff:  Lab Results  Component Value Date   WBC 6.6 11/24/2019   HGB 7.0 (L) 11/24/2019   HGB 10.0 (L) 03/31/2019   HCT 22.3 (L) 11/24/2019   HCT 32.9 (L) 03/31/2019   PLT 266 11/24/2019   PLT 214 03/31/2019   LYMPHOPCT 26 11/22/2019   MONOPCT 8 11/22/2019   EOSPCT 2 11/22/2019   BASOPCT 1 11/22/2019    CMP:  Lab Results  Component Value Date   NA 137 11/24/2019   NA 138 05/06/2019   K 3.8 11/24/2019   CL 104 11/24/2019   CO2 25 11/24/2019   BUN 8 11/24/2019   BUN 8 05/06/2019   CREATININE 0.65 11/24/2019   PROT 5.2 (L) 11/23/2019   PROT 6.8 12/07/2016   ALBUMIN 3.1 (L) 11/23/2019   ALBUMIN 4.6 12/07/2016   BILITOT 0.9 11/23/2019   BILITOT 1.3 (H)  12/07/2016   ALKPHOS 20 (L) 11/23/2019   AST 23 11/23/2019   ALT 16 11/23/2019  .   Total Discharge time is about 33 minutes  Roxan Hockey M.D on 11/24/2019 at 3:40 PM  Go to [email or web address] -  for contact info  [facility] - Office  [phone]

## 2019-11-24 NOTE — Telephone Encounter (Signed)
Referral sent to Dr. Carlean Purl via [place].

## 2019-11-24 NOTE — Procedures (Addendum)
Small Bowel [place] Capsule Study Procedure date:  11/22/29  Referring Provider:  Dr. Gala Romney  PCP:  Dr. [name], DO  Indication for procedure:   74 year old female with history of IDA s/p evaluation August 2020 in [place] by Dr. Carlean Purl including EGD and colonoscopy, followed by Hematology requiring iron infusions, presenting with profound symptomatic anemia with Hgb 5.9, reported melena, and heme positive stool. No NSAIDs or anticoagulation. Incidentally found to have acute, uncomplicated sigmoid diverticulitis on CT 11/14/19, with clinical improvement at time of admission. EGD completed 8/31 with normal esophagus, small hiatal hernia, innocent-appearing duodenal erosions. Capsule study now to be done to evaluate for small bowel source. Today, Hgb 7.0, receiving total of 2 units PRBCs.   Findings:   Capsule study complete to the cecum. Benign-appearing duodenal erosions without active bleeding noted (00:08:49). Interesting appearing small bowel polypoid-like lesions without bleeding on multiple images best seen starting at 00:56:47. Just as capsule entered cecum, large amounts of old blood noted without obvious source on multiple images. Slightly lighter dark red blood noted on images starting at 00:48:44 most noticeably.   First Duodenal image: 00:03:38 First Cecal image: 04:15:14 Small Bowel Passage time:  4h 46m  Summary & Recommendations: Acute GI bleeding without obvious upper or small bowel source and notable old blood mixed with some lighter dark red blood in colon just after capsule entered cecum on multiple images. No obvious lesion noted and query diverticular origin. Duodenal erosions noted but would not account for significant acute blood loss anemia, and no obvious old blood was seen in small bowel. Interesting small bowel variants as noted above were seen, which could be evaluated further with CTE in an elective setting.  Patient recently had CT documented sigmoid  diverticulitis, uncomplicated, with clinical improvement when seen for consultation this admission. Known diverticula on prior colonoscopy Aug 2020.   Report to be discussed with Dr. Gala Romney, with further recommendations to follow.  Annitta Needs, PhD, ANP-BC [facility]    Addendum: possible lymphangiectasias noted in small bowel. Obvious old blood in cecum as described.   Follow clinically. Needs additional 1 unit PRBCs and follow another 24 hours. Stay on clear liquids for now. At some point will need ileocolonoscopy, with timing to be determined, especially in light of recent bout with diverticulitis. Patient denies any overt bleeding, no abdominal pain, and dyspnea on exertion/shortness of breath has resolved.  Annitta Needs, PhD, ANP-BC [facility]   Attending note: Agree with above.  Pertinent images reviewed.

## 2019-11-24 NOTE — Telephone Encounter (Signed)
Stacey Reyes, please have patient repeat CBC on Friday. Discharged today after GI bleed.   [facility] clinical pool: she is established with Dr. Carlean Purl. Please help facilitate close follow-up there. Needs outpatient colonoscopy.

## 2019-11-24 NOTE — Progress Notes (Signed)
Patient eager to go home and has asked multiple times if she may leave instead of staying overnight again. She has reported to me signs/symptoms to watch for and when to seek urgent care. She is agreeable to 1 unit PRBCs. Well-versed in her health and will monitor closely at home. She understands we ideally would have her stay an additional 24 hours but still would like to observe from home. We will have repeat CBC later this week. She is to return to hospital if any recurrent symptoms. Follow-up will be made with Dr. Carlean Purl.

## 2019-11-24 NOTE — Addendum Note (Signed)
Addended by: Hassan Rowan on: 11/24/2019 04:35 PM   Modules accepted: Orders

## 2019-11-24 NOTE — Discharge Instructions (Signed)
1)Avoid ibuprofen/Advil/Aleve/Motrin/Goody Powders/Naproxen/BC powders/Meloxicam/Diclofenac/Indomethacin and other Nonsteroidal anti-inflammatory medications as these will make you more likely to bleed and can cause stomach ulcers, can also cause Kidney problems.   2)Repeat CBC this Friday 11/26/19  3) follow-up with Dr. Gala Romney and Dr Geisner----gastroenterologist as advised  4) call if persistent vomiting, dark stools, bright red blood in your stool, or vomiting blood

## 2019-11-25 ENCOUNTER — Other Ambulatory Visit: Payer: Self-pay

## 2019-11-25 ENCOUNTER — Telehealth: Payer: Self-pay | Admitting: Internal Medicine

## 2019-11-25 ENCOUNTER — Telehealth: Payer: Self-pay | Admitting: *Deleted

## 2019-11-25 ENCOUNTER — Telehealth: Payer: Self-pay | Admitting: Family Medicine

## 2019-11-25 DIAGNOSIS — D649 Anemia, unspecified: Secondary | ICD-10-CM

## 2019-11-25 LAB — BPAM RBC
Blood Product Expiration Date: 202109122359
Blood Product Expiration Date: 202109292359
Blood Product Expiration Date: 202110032359
ISSUE DATE / TIME: 202108301503
ISSUE DATE / TIME: 202108301821
ISSUE DATE / TIME: 202109011242
Unit Type and Rh: 5100
Unit Type and Rh: 5100
Unit Type and Rh: 5100

## 2019-11-25 LAB — TYPE AND SCREEN
ABO/RH(D): O POS
Antibody Screen: NEGATIVE
Unit division: 0
Unit division: 0
Unit division: 0

## 2019-11-25 NOTE — Telephone Encounter (Signed)
Pt called to say she went to the ER and was told to follow up with Dr Gala Romney and Roseanne Kaufman, NP in one week. Please advise if she needs OV with [place]. It looks like Algis Greenhouse, LPN has sent a referral to Dr Leroy Kennedy per Vicente Males. Please advise. [phone]

## 2019-11-25 NOTE — Telephone Encounter (Signed)
Pt has appt to see Dr Lajuana Ripple on 11/30/19 for hospital follow up but pt says on her discharge paperwork, it says for pt to have CBC done by 11/25/19. Pt wants to know if it is ok to wait and have CBC done when she comes in for her appt or if Dr Lajuana Ripple wants to go ahead and put the order in so that pt can have it done tomorrow?

## 2019-11-25 NOTE — Telephone Encounter (Signed)
Noted. Pt is aware and voiced understanding.

## 2019-11-25 NOTE — Telephone Encounter (Signed)
Spoke with pt and advised to call GI as they tried to contact her yesterday and looks like they want to order labs to have drawn tomorrow. Pt will call them now.

## 2019-11-25 NOTE — Telephone Encounter (Signed)
Routing to Roseanne Kaufman, NP. Please advise if pt needs apt in office. Previous note states pt needed a referral. Pt is going to complete labs at [facility] as directed tomorrow.

## 2019-11-25 NOTE — Telephone Encounter (Signed)
Pt returned call. Lab orders were placed and faxed to [facility].

## 2019-11-25 NOTE — Telephone Encounter (Signed)
Only need blood count this week. She needs to follow-up with Dr. Carlean Purl as she is established there.

## 2019-11-25 NOTE — Telephone Encounter (Signed)
TRANSITIONAL CARE MANAGEMENT TELEPHONE OUTREACH NOTE   Contact Date: 11/25/2019 Contacted By: Faylene Million, LPN   DISCHARGE INFORMATION Date of Discharge:11/24/19 Discharge Facility: [facility] Discharge Diagnosis: Acute blood loss anemia  Outpatient Follow Up Recommendations  Recommendations for primary care physician for things to follow:   1)Avoid ibuprofen/Advil/Aleve/Motrin/Goody Powders/Naproxen/BC powders/Meloxicam/Diclofenac/Indomethacin and other Nonsteroidal anti-inflammatory medications as these will make you more likely to bleed and can cause stomach ulcers, can also cause Kidney problems.   2)Repeat CBC this Friday 11/26/19  3) follow-up with Dr. Gala Romney and Dr Geisner----gastroenterologist as advised  4) call if persistent vomiting, dark stools, bright red blood in your stool, or vomiting blood   Stacey Reyes is a female primary care patient of [name], DO. An outgoing telephone call was made today and I spoke with the patient.  Stacey Reyes condition(s) and treatment(s) were discussed. An opportunity to ask questions was provided and all were answered or forwarded as appropriate.    ACTIVITIES OF DAILY LIVING  Stacey Reyes lives with their spouse and she can perform ADLs independently. her primary caregiver is her husband. she is able to depend on her primary caregiver(s) for consistent help. Transportation to appointments, to pick up medications, and to run errands is not a problem.  (Consider referral to [facility] if transportation or a consistent caregiver is a problem)   Fall Risk Fall Risk  09/23/2019 04/16/2019  Falls in the past year? 0 0    low [place] Modifications/Assistive Devices Wheelchair: No Cane: No Ramp: No Bedside Toilet: No Hospital Bed:  No Other:    [place] she is not receiving home health  services.     MEDICATION RECONCILIATION  Stacey Reyes has been able to pick-up all  prescribed discharge medications from the pharmacy.   A post discharge medication reconciliation was performed and the complete medication list was reviewed with the patient/caregiver and is current as of 11/25/2019. Changes highlighted below.  Discontinued Medications STOP taking these medications   amoxicillin-clavulanate 875-125 MG tablet Commonly known as: AUGMENTIN   lisinopril 5 MG tablet Commonly known as: ZESTRIL     Current Medication List Allergies as of 11/25/2019      Reactions   Sulfa Antibiotics Rash      Medication List       Accurate as of November 25, 2019  2:47 PM. If you have any questions, ask your nurse or doctor.        acetaminophen 325 MG tablet Commonly known as: TYLENOL Take 2 tablets (650 mg total) by mouth every 6 (six) hours as needed for mild pain (or Fever >/= 101).   bisoprolol 5 MG tablet Commonly known as: ZEBETA Take 0.5 tablets (2.5 mg total) by mouth daily.   COPPER CAPS PO Take 1 capsule by mouth daily.   cyanocobalamin 2000 MCG tablet Commonly known as: [facility] VITAMIN B12 Take 1 tablet (2,000 mcg total) by mouth daily.   ergocalciferol 1.25 MG (50000 UT) capsule Commonly known as: VITAMIN D2 Take 1 capsule (50,000 Units total) by mouth once a week.   Magnesium 200 MG Tabs Take 1 tablet (200 mg total) by mouth daily.   meclizine 25 MG tablet Commonly known as: ANTIVERT Take 1 tablet (25 mg total) by mouth 3 (three) times daily as needed for dizziness.   MELATONIN PO Take 1 tablet by mouth daily.   multivitamin capsule Take 1 capsule by mouth daily.   ondansetron 4 MG  tablet Commonly known as: ZOFRAN Take 1 tablet (4 mg total) by mouth every 6 (six) hours as needed for nausea.   pantoprazole 40 MG tablet Commonly known as: Protonix Take 1 tablet (40 mg total) by mouth daily.   rosuvastatin 10 MG tablet Commonly known as: CRESTOR TAKE 1 TABLET BY MOUTH AT  BEDTIME What changed: when to take this   thiamine 100 MG  tablet Commonly known as: Vitamin B-1 Take 100 mg by mouth daily.   vitamin E 180 MG (400 UNITS) capsule Take 400 Units by mouth daily.        PATIENT EDUCATION & FOLLOW-UP PLAN  An appointment for Transitional Care Management is scheduled with [name], DO on 11/30/19 at 9 am  Take all medications as prescribed  Contact our office by calling [phone] if you have any questions or concerns

## 2019-11-26 ENCOUNTER — Other Ambulatory Visit: Payer: Medicare Other

## 2019-11-26 ENCOUNTER — Other Ambulatory Visit: Payer: Self-pay

## 2019-11-26 DIAGNOSIS — D649 Anemia, unspecified: Secondary | ICD-10-CM | POA: Diagnosis not present

## 2019-11-27 LAB — CBC WITH DIFFERENTIAL/PLATELET
Basophils Absolute: 0 10*3/uL (ref 0.0–0.2)
Basos: 1 %
EOS (ABSOLUTE): 0.2 10*3/uL (ref 0.0–0.4)
Eos: 4 %
Hematocrit: 29.1 % — ABNORMAL LOW (ref 34.0–46.6)
Hemoglobin: 9.3 g/dL — ABNORMAL LOW (ref 11.1–15.9)
Immature Grans (Abs): 0 10*3/uL (ref 0.0–0.1)
Immature Granulocytes: 0 %
Lymphocytes Absolute: 1.5 10*3/uL (ref 0.7–3.1)
Lymphs: 25 %
MCH: 29.7 pg (ref 26.6–33.0)
MCHC: 32 g/dL (ref 31.5–35.7)
MCV: 93 fL (ref 79–97)
Monocytes Absolute: 0.6 10*3/uL (ref 0.1–0.9)
Monocytes: 10 %
Neutrophils Absolute: 3.7 10*3/uL (ref 1.4–7.0)
Neutrophils: 60 %
Platelets: 301 10*3/uL (ref 150–450)
RBC: 3.13 x10E6/uL — ABNORMAL LOW (ref 3.77–5.28)
RDW: 16 % — ABNORMAL HIGH (ref 11.7–15.4)
WBC: 6.1 10*3/uL (ref 3.4–10.8)

## 2019-11-29 NOTE — Progress Notes (Signed)
Subjective: CC: hospital discharge/ TOC blood loss anemia PCP: [name], DO UMP:[id number] H Fader is a 74 y.o. female presenting to clinic today for:  1. Blood loss anemia Patient was having hypotension over the weekend prior to presentation to the ED.  She was being treated for a left lower quadrant diverticulitis.  She had been noticing some blood in her stool and ultimately started developing some dyspnea on exertion.  She went to the emergency department was found to have a hemoglobin of 5.9.  She also had a mild leukocytosis.  She was transfused 2 units of packed red blood cells.  After her hemoglobin only came up to 7, she was transfused yet again with another unit of packed red blood cells 11/24/2019.  EGD was unrevealing.  She had a capsule study which did show some old blood but no overt reason for rapid blood loss.  It was recommended that she pursue ongoing work-up on the outpatient setting with GI.  Her hemoglobin was 7.0 at discharge on 11/24/2019.  When she got rechecked on 11/26/2019 it was up to 9.3.  Overall she is feeling better than when she went to the hospital.  However, she continues to have some dyspnea on exertion when she goes up and down the stairs several times and her energy has not gotten back to baseline.  She is wondering if she should be on iron supplementation.  She is completed the antibiotics.  She has no more blood in stool nor any left lower quadrant abdominal pain.  P.o. intake is normal.  She has a trip planned for [place] this Friday and she will be gone for a week.  She wanted my opinion on whether or not she could continue going.  She has not yet heard from gastroenterology for an appointment.  She has not resumed her blood pressure medications.  Her blood pressures have been fluctuating from systolics of 88 as high as systolics of 540.  Additionally, she thinks she has developed a yeast vaginitis since the antibiotics.  ROS: Per HPI  Allergies    Allergen Reactions  . Sulfa Antibiotics Rash   Past Medical History:  Diagnosis Date  . Anemia   . Cataract    removed bilateral  . Chronic cystitis   . Heart murmur   . Hyperlipidemia   . Hypertension   . Personal history of colonic polyps-adenomas 01/07/2012   2009 - 2 diminutive adenomas (prior polyps also) 01/07/2012 - 2 diminutive adenomas      Current Outpatient Medications:  .  acetaminophen (TYLENOL) 325 MG tablet, Take 2 tablets (650 mg total) by mouth every 6 (six) hours as needed for mild pain (or Fever >/= 101)., Disp: 12 tablet, Rfl: 0 .  bisoprolol (ZEBETA) 5 MG tablet, Take 0.5 tablets (2.5 mg total) by mouth daily., Disp: 45 tablet, Rfl: 3 .  Copper Gluconate (COPPER CAPS PO), Take 1 capsule by mouth daily., Disp: , Rfl:  .  cyanocobalamin (CVS VITAMIN B12) 2000 MCG tablet, Take 1 tablet (2,000 mcg total) by mouth daily., Disp:  , Rfl:  .  ergocalciferol (VITAMIN D2) 1.25 MG (50000 UT) capsule, Take 1 capsule (50,000 Units total) by mouth once a week., Disp: 16 capsule, Rfl: 3 .  Magnesium 200 MG TABS, Take 1 tablet (200 mg total) by mouth daily., Disp: 30 tablet, Rfl: 6 .  meclizine (ANTIVERT) 25 MG tablet, Take 1 tablet (25 mg total) by mouth 3 (three) times daily as needed for dizziness., Disp:  30 tablet, Rfl: 1 .  MELATONIN PO, Take 1 tablet by mouth daily., Disp: , Rfl:  .  Multiple Vitamin (MULTIVITAMIN) capsule, Take 1 capsule by mouth daily., Disp: , Rfl:  .  ondansetron (ZOFRAN) 4 MG tablet, Take 1 tablet (4 mg total) by mouth every 6 (six) hours as needed for nausea., Disp: 20 tablet, Rfl: 0 .  pantoprazole (PROTONIX) 40 MG tablet, Take 1 tablet (40 mg total) by mouth daily., Disp: 30 tablet, Rfl: 1 .  rosuvastatin (CRESTOR) 10 MG tablet, TAKE 1 TABLET BY MOUTH AT  BEDTIME (Patient taking differently: Take 10 mg by mouth daily. ), Disp: 90 tablet, Rfl: 3 .  thiamine (VITAMIN B-1) 100 MG tablet, Take 100 mg by mouth daily., Disp: , Rfl:  .  vitamin E 400 UNIT  capsule, Take 400 Units by mouth daily. , Disp: , Rfl:  Social History   Socioeconomic History  . Marital status: Married    Spouse name: Not on file  . Number of children: 1  . Years of education: Not on file  . Highest education level: Not on file  Occupational History  . Occupation: retired  Tobacco Use  . Smoking status: Former Smoker    Quit date: 12/23/1984    Years since quitting: 34.9  . Smokeless tobacco: Never Used  Vaping Use  . Vaping Use: Never used  Substance and Sexual Activity  . Alcohol use: Yes    Comment: rare  . Drug use: No  . Sexual activity: Not on file  Other Topics Concern  . Not on file  Social History Narrative   Patient is married and retired and has 1 grown child   Social Determinants of Radio broadcast assistant Strain:   . Difficulty of Paying Living Expenses: Not on file  Food Insecurity:   . Worried About Charity fundraiser in the Last Year: Not on file  . Ran Out of Food in the Last Year: Not on file  Transportation Needs:   . Lack of Transportation (Medical): Not on file  . Lack of Transportation (Non-Medical): Not on file  Physical Activity:   . Days of Exercise per Week: Not on file  . Minutes of Exercise per Session: Not on file  Stress:   . Feeling of Stress : Not on file  Social Connections:   . Frequency of Communication with Friends and Family: Not on file  . Frequency of Social Gatherings with Friends and Family: Not on file  . Attends Religious Services: Not on file  . Active Member of Clubs or Organizations: Not on file  . Attends Archivist Meetings: Not on file  . Marital Status: Not on file  Intimate Partner Violence:   . Fear of Current or Ex-Partner: Not on file  . Emotionally Abused: Not on file  . Physically Abused: Not on file  . Sexually Abused: Not on file   Family History  Problem Relation Age of Onset  . Colon cancer Mother 62       80's  . Breast cancer Sister   . Asthma Brother   .  Colon polyps Neg Hx   . Esophageal cancer Neg Hx   . Rectal cancer Neg Hx   . Stomach cancer Neg Hx     Objective: Office vital signs reviewed. BP 109/66   Pulse 84   Temp 97.8 F (36.6 C) (Temporal)   Ht 5\' 6"  (1.676 m)   Wt 199 lb (90.3 kg)  SpO2 98%   BMI 32.12 kg/m   Physical Examination:  General: Awake, alert, well nourished, well appearing. No acute distress HEENT: Normal, sclera white, mild conjunctival pallor Cardio: regular rate and rhythm, S1S2 heard, 3/6 systolic murmur R>L SB Pulm: clear to auscultation bilaterally, no wheezes, rhonchi or rales; normal work of breathing on room air GI: soft, non-tender, non-distended, bowel sounds present x4, no hepatomegaly, no splenomegaly, no masses Extremities: warm, well perfused, No edema, cyanosis or clubbing; +2 pulses bilaterally  Assessment/ Plan: 74 y.o. female   1. Acute blood loss anemia Recheck hemoglobin.  I do worry that her blood pressures are fluctuating so much.  Her blood pressure still remains on the lower end of normal despite being off of medications.  I agree that she should hold off on a blood pressure medicine for now.  Continue to monitor blood pressures.  Threshold for reinitiation discussed.  Pending today's lab work, she may be able to go back to [place] but we discussed that if her hemoglobin is dropping that I certainly would delay the trip.  I will reach out to gastroenterology to arrange for hospital follow-up appointment.  Okay to start ferrous sulfate for the next couple of weeks since her ferritin was on the low end of normal.  Hopefully will help her build her stores back up. - CBC - Hemoglobin, fingerstick - ferrous sulfate 324 (65 Fe) MG TBEC; Take 1 tablet (325 mg total) by mouth in the morning and at bedtime for 15 days.  Dispense: 30 tablet; Refill: 0  2. Hospital discharge follow-up  3. Other fatigue Likely due to the above  4. Yeast vaginitis Empiric treatment with Diflucan sent -  fluconazole (DIFLUCAN) 150 MG tablet; Take 1 tablet (150 mg total) by mouth once for 1 dose. May repeat in 3 days if symptoms persist  Dispense: 2 tablet; Refill: 0   No orders of the defined types were placed in this encounter.  No orders of the defined types were placed in this encounter.   Today's visit is for Transitional Care Management.  The patient was discharged from [facility] on 11/24/2019 with a primary diagnosis of acute blood loss anemia.   Contact with the patient and/or caregiver, by a clinical staff member, was made on 11/25/2019 and was documented as a telephone encounter within the EMR.  Through chart review and discussion with the patient I have determined that management of their condition is of moderate complexity.    [name], DO [place] [phone]

## 2019-11-30 ENCOUNTER — Other Ambulatory Visit: Payer: Self-pay

## 2019-11-30 ENCOUNTER — Ambulatory Visit (INDEPENDENT_AMBULATORY_CARE_PROVIDER_SITE_OTHER): Payer: Medicare Other | Admitting: Family Medicine

## 2019-11-30 VITALS — BP 109/66 | HR 84 | Temp 97.8°F | Ht 66.0 in | Wt 199.0 lb

## 2019-11-30 DIAGNOSIS — Z09 Encounter for follow-up examination after completed treatment for conditions other than malignant neoplasm: Secondary | ICD-10-CM | POA: Diagnosis not present

## 2019-11-30 DIAGNOSIS — D62 Acute posthemorrhagic anemia: Secondary | ICD-10-CM | POA: Diagnosis not present

## 2019-11-30 DIAGNOSIS — B373 Candidiasis of vulva and vagina: Secondary | ICD-10-CM

## 2019-11-30 DIAGNOSIS — R5383 Other fatigue: Secondary | ICD-10-CM

## 2019-11-30 DIAGNOSIS — B3731 Acute candidiasis of vulva and vagina: Secondary | ICD-10-CM

## 2019-11-30 LAB — HEMOGLOBIN, FINGERSTICK: Hemoglobin: 9.5 g/dL — ABNORMAL LOW (ref 11.1–15.9)

## 2019-11-30 LAB — CBC
Hematocrit: 29.9 % — ABNORMAL LOW (ref 34.0–46.6)
Hemoglobin: 9.4 g/dL — ABNORMAL LOW (ref 11.1–15.9)
MCH: 28.2 pg (ref 26.6–33.0)
MCHC: 31.4 g/dL — ABNORMAL LOW (ref 31.5–35.7)
MCV: 90 fL (ref 79–97)
Platelets: 327 10*3/uL (ref 150–450)
RBC: 3.33 x10E6/uL — ABNORMAL LOW (ref 3.77–5.28)
RDW: 15.6 % — ABNORMAL HIGH (ref 11.7–15.4)
WBC: 5.3 10*3/uL (ref 3.4–10.8)

## 2019-11-30 MED ORDER — FLUCONAZOLE 150 MG PO TABS: 150.0000 mg | ORAL_TABLET | Freq: Once | ORAL | 0 refills | Status: AC

## 2019-11-30 MED ORDER — FERROUS SULFATE 324 (65 FE) MG PO TBEC: 1.0000 | DELAYED_RELEASE_TABLET | Freq: Two times a day (BID) | ORAL | 0 refills | Status: DC

## 2019-12-07 ENCOUNTER — Other Ambulatory Visit: Payer: Self-pay | Admitting: Family Medicine

## 2019-12-07 DIAGNOSIS — D62 Acute posthemorrhagic anemia: Secondary | ICD-10-CM

## 2019-12-08 DIAGNOSIS — N12 Tubulo-interstitial nephritis, not specified as acute or chronic: Secondary | ICD-10-CM | POA: Diagnosis not present

## 2019-12-08 DIAGNOSIS — D649 Anemia, unspecified: Secondary | ICD-10-CM | POA: Diagnosis not present

## 2019-12-08 DIAGNOSIS — N390 Urinary tract infection, site not specified: Secondary | ICD-10-CM | POA: Diagnosis not present

## 2019-12-08 DIAGNOSIS — Z87440 Personal history of urinary (tract) infections: Secondary | ICD-10-CM | POA: Diagnosis not present

## 2019-12-10 ENCOUNTER — Telehealth: Payer: Self-pay

## 2019-12-10 ENCOUNTER — Telehealth: Payer: Self-pay | Admitting: Internal Medicine

## 2019-12-10 DIAGNOSIS — D649 Anemia, unspecified: Secondary | ICD-10-CM

## 2019-12-10 NOTE — Telephone Encounter (Signed)
I left a message with Dr. Roseanne Kaufman office to inquire about capsule endo

## 2019-12-10 NOTE — Telephone Encounter (Signed)
Patient called and confirmed appt with Dr. Carlean Purl on 9/28

## 2019-12-10 NOTE — Telephone Encounter (Signed)
Left message for patient to call back for follow up on 12/21/19 2:10.  I have asked that she call back to discuss    Stacey Mayer, MD sent to [name], DO Cc: Marlon Pel, RN; Daneil Dolin, MD Was hospitalized at [facility] for decreased hemoglobin in the setting of chronic iron deficiency anemia and a question of melena with a negative EGD. A capsule endoscope was placed but I do not see a reading yet.   It makes sense for her to follow-up with [place] because she has been my long-term patient but it will be important to have the capsule endoscopy report read so hopefully we can check with [facility] about this as well. I have CCed Dr. Gala Romney also   CEG

## 2019-12-10 NOTE — Telephone Encounter (Signed)
[phone] please call [facility] gi   Wants to know if dr Gala Romney has read her capsule study she had done in the hospital as inpatient

## 2019-12-12 ENCOUNTER — Other Ambulatory Visit: Payer: Self-pay

## 2019-12-12 ENCOUNTER — Encounter (HOSPITAL_COMMUNITY): Payer: Self-pay | Admitting: *Deleted

## 2019-12-12 ENCOUNTER — Emergency Department (HOSPITAL_COMMUNITY): Payer: Medicare Other

## 2019-12-12 ENCOUNTER — Emergency Department (HOSPITAL_COMMUNITY)
Admission: EM | Admit: 2019-12-12 | Discharge: 2019-12-12 | Disposition: A | Discharge status: Home / Self Care | Payer: Medicare Other | Attending: Emergency Medicine | Admitting: Emergency Medicine

## 2019-12-12 DIAGNOSIS — I10 Essential (primary) hypertension: Secondary | ICD-10-CM | POA: Insufficient documentation

## 2019-12-12 DIAGNOSIS — R5383 Other fatigue: Secondary | ICD-10-CM | POA: Insufficient documentation

## 2019-12-12 DIAGNOSIS — K922 Gastrointestinal hemorrhage, unspecified: Secondary | ICD-10-CM | POA: Diagnosis not present

## 2019-12-12 DIAGNOSIS — D649 Anemia, unspecified: Secondary | ICD-10-CM | POA: Diagnosis not present

## 2019-12-12 DIAGNOSIS — R0602 Shortness of breath: Secondary | ICD-10-CM | POA: Diagnosis not present

## 2019-12-12 DIAGNOSIS — Z87891 Personal history of nicotine dependence: Secondary | ICD-10-CM | POA: Diagnosis not present

## 2019-12-12 DIAGNOSIS — J9811 Atelectasis: Secondary | ICD-10-CM | POA: Diagnosis not present

## 2019-12-12 LAB — COMPREHENSIVE METABOLIC PANEL
ALT: 15 U/L (ref 0–44)
AST: 23 U/L (ref 15–41)
Albumin: 3.3 g/dL — ABNORMAL LOW (ref 3.5–5.0)
Alkaline Phosphatase: 27 U/L — ABNORMAL LOW (ref 38–126)
Anion gap: 7 (ref 5–15)
BUN: 11 mg/dL (ref 8–23)
CO2: 23 mmol/L (ref 22–32)
Calcium: 8.4 mg/dL — ABNORMAL LOW (ref 8.9–10.3)
Chloride: 106 mmol/L (ref 98–111)
Creatinine, Ser: 0.5 mg/dL (ref 0.44–1.00)
GFR calc Af Amer: >60 mL/min (ref >60)
GFR calc non Af Amer: >60 mL/min (ref >60)
Glucose, Bld: 112 mg/dL — ABNORMAL HIGH (ref 70–99)
Potassium: 3.7 mmol/L (ref 3.5–5.1)
Sodium: 136 mmol/L (ref 135–145)
Total Bilirubin: 0.5 mg/dL (ref 0.3–1.2)
Total Protein: 5.8 g/dL — ABNORMAL LOW (ref 6.5–8.1)

## 2019-12-12 LAB — CBC WITH DIFFERENTIAL/PLATELET
Abs Immature Granulocytes: 0.02 10*3/uL (ref 0.00–0.07)
Basophils Absolute: 0 10*3/uL (ref 0.0–0.1)
Basophils Relative: 1 %
Eosinophils Absolute: 0.3 10*3/uL (ref 0.0–0.5)
Eosinophils Relative: 4 %
HCT: 21.5 % — ABNORMAL LOW (ref 36.0–46.0)
Hemoglobin: 6.5 g/dL — CL (ref 12.0–15.0)
Immature Granulocytes: 0 %
Lymphocytes Relative: 26 %
Lymphs Abs: 1.5 10*3/uL (ref 0.7–4.0)
MCH: 30.1 pg (ref 26.0–34.0)
MCHC: 30.2 g/dL (ref 30.0–36.0)
MCV: 99.5 fL (ref 80.0–100.0)
Monocytes Absolute: 0.4 10*3/uL (ref 0.1–1.0)
Monocytes Relative: 8 %
Neutro Abs: 3.4 10*3/uL (ref 1.7–7.7)
Neutrophils Relative %: 61 %
Platelets: 206 10*3/uL (ref 150–400)
RBC: 2.16 MIL/uL — ABNORMAL LOW (ref 3.87–5.11)
RDW: 19.4 % — ABNORMAL HIGH (ref 11.5–15.5)
WBC: 5.6 10*3/uL (ref 4.0–10.5)
nRBC: 0 % (ref 0.0–0.2)

## 2019-12-12 LAB — CBC
HCT: 25.8 % — ABNORMAL LOW (ref 36.0–46.0)
Hemoglobin: 8.2 g/dL — ABNORMAL LOW (ref 12.0–15.0)
MCH: 30.7 pg (ref 26.0–34.0)
MCHC: 31.8 g/dL (ref 30.0–36.0)
MCV: 96.6 fL (ref 80.0–100.0)
Platelets: 181 10*3/uL (ref 150–400)
RBC: 2.67 MIL/uL — ABNORMAL LOW (ref 3.87–5.11)
RDW: 17.2 % — ABNORMAL HIGH (ref 11.5–15.5)
WBC: 5.9 10*3/uL (ref 4.0–10.5)
nRBC: 0 % (ref 0.0–0.2)

## 2019-12-12 LAB — PREPARE RBC (CROSSMATCH)

## 2019-12-12 LAB — POC OCCULT BLOOD, ED: Fecal Occult Bld: POSITIVE — AB

## 2019-12-12 MED ORDER — PANTOPRAZOLE SODIUM 40 MG IV SOLR
40.0000 mg | Freq: Once | INTRAVENOUS | Status: AC
  Administered 2019-12-12: 40 mg via INTRAVENOUS
  Filled 2019-12-12: qty 40

## 2019-12-12 MED ORDER — PANTOPRAZOLE SODIUM 40 MG PO TBEC: 40.0000 mg | DELAYED_RELEASE_TABLET | Freq: Every day | ORAL | 0 refills | Status: DC

## 2019-12-12 MED ORDER — SODIUM CHLORIDE 0.9% IV SOLUTION: Freq: Once | INTRAVENOUS | Status: AC

## 2019-12-12 NOTE — ED Notes (Signed)
Date and time results received: 12/12/19 932   Test: Hemoglobin Critical Value: 6.5  Name of Provider Notified: Dr .Reather Converse  Orders Received? Or Actions Taken?:No new orders given.

## 2019-12-12 NOTE — Discharge Instructions (Addendum)
Great to see you today.  Your hemoglobin today was 6.  You received 2 units of blood in the ED. your hemoglobin came up to **.  You are safe to be discharged.  We would recommend that you follow-up with your primary physician and your GI doctor given that you are still bleeding and we do not know where from.  I am prescribing you Protonix which you should take daily to prevent a bleed in the stomach.  If you develop rectal bleeding, belly pains, worsening black stools, vomiting of blood, dizziness, chest pain, worsening breathing and please come back to the ER immediately.  You may need to be admitted for further work-up of your stomach bleeding sooner.   Please arrange an appointment tomorrow or the day after   Hemoglobin following the blood transfusions came up to 8.2.

## 2019-12-12 NOTE — ED Provider Notes (Signed)
[facility] EMERGENCY DEPARTMENT Provider Note   CSN: [id number] Arrival date & time: 12/12/19  2505     History Chief Complaint  Patient presents with  . Shortness of Breath    Stacey Reyes is a 74 y.o. female.  Stacey Reyes is 74 yr old with PMH of symptomatic anemia, hyperlipidemia, hypertension, colonic polyps, cardiac murmur presents with 5-day history of shortness of breath, fatigue and black stools.  Patient has been seen for anemia with transfusions 3 times in the last 1 year.  This has been investigated however no obvious source of bleeding has been found.  Her last visit to the ER was on the 3rd September this month where she had a transfusion was discharged.. Patient also endorses melena which started 6 to 7 days ago.  Denies taking Pepto-Bismol, NSAIDs, aspirin or anticoagulants.  She feels short of breath on exertion and she usually gets these symptoms when she is anemic.  Able to ambulate a few yards before feeling breathless.  She was recently treated for a UTI with cefdinir and has 1 pill left . Denies fevers, chest pain, dizziness, cough, palpitations, abdominal pain, urinary symptoms or rectal bleeding.   She has a follow-up with her PCP on 21 September and follow-up with GI Dr. Carlean Purl on 28th September which may be scheduled for a colonoscopy for further work-up of her symptomatic anemia and melena.  Denies Covid exposures and has been double vaccinated.  Recent travel to [place] for a vacation and came back yesterday.         Past Medical History:  Diagnosis Date  . Anemia   . Cataract    removed bilateral  . Chronic cystitis   . Heart murmur   . Hyperlipidemia   . Hypertension   . Personal history of colonic polyps-adenomas 01/07/2012   2009 - 2 diminutive adenomas (prior polyps also) 01/07/2012 - 2 diminutive adenomas      Patient Active Problem List   Diagnosis Date Noted  . Need for immunization against influenza 12/14/2019  . Acute blood loss  anemia 11/22/2019  . Educated about COVID-19 virus infection 07/05/2019  . SOB (shortness of breath) 07/05/2019  . B12 deficiency 03/25/2019  . Symptomatic anemia 03/24/2019  . Osteopenia after menopause 04/22/2018  . Cardiac murmur due to mitral valve disorder 03/03/2018  . Heart murmur 03/03/2018  . LVH (left ventricular hypertrophy) 11/20/2015  . Arrhythmia 10/11/2015  . Essential hypertension 02/24/2015  . Hyperlipidemia 02/24/2015  . Personal history of colonic polyps-adenomas 01/07/2012    Past Surgical History:  Procedure Laterality Date  . BREAST BIOPSY Right    No Scar seen   . COLONOSCOPY  multiple  . ESOPHAGOGASTRODUODENOSCOPY (EGD) WITH PROPOFOL N/A 11/23/2019   Procedure: ESOPHAGOGASTRODUODENOSCOPY (EGD) WITH PROPOFOL;  Surgeon: Daneil Dolin, MD;  Location: [facility];  Service: Endoscopy;  Laterality: N/A;  . GIVENS CAPSULE STUDY N/A 11/23/2019   Procedure: GIVENS CAPSULE STUDY;  Surgeon: Daneil Dolin, MD;  Location: [facility] ENDO SUITE;  Service: Endoscopy;  Laterality: N/A;     OB History   No obstetric history on file.     Family History  Problem Relation Age of Onset  . Colon cancer Mother 78       80's  . Breast cancer Sister   . Asthma Brother   . Colon polyps Neg Hx   . Esophageal cancer Neg Hx   . Rectal cancer Neg Hx   . Stomach cancer Neg Hx  Social History   Tobacco Use  . Smoking status: Former Smoker    Quit date: 12/23/1984    Years since quitting: 35.0  . Smokeless tobacco: Never Used  Vaping Use  . Vaping Use: Never used  Substance Use Topics  . Alcohol use: Yes    Comment: rare  . Drug use: No    Home Medications Prior to Admission medications   Medication Sig Start Date End Date Taking? Authorizing Provider  acetaminophen (TYLENOL) 325 MG tablet Take 2 tablets (650 mg total) by mouth every 6 (six) hours as needed for mild pain (or Fever >/= 101). Patient not taking: Reported on 12/14/2019 11/24/19  Yes Emokpae, Courage, MD    Copper Gluconate (COPPER CAPS PO) Take 1 capsule by mouth daily. Patient not taking: Reported on 12/14/2019   Yes [provider]  cyanocobalamin ([employer] VITAMIN B12) 2000 MCG tablet Take 1 tablet (2,000 mcg total) by mouth daily. Patient not taking: Reported on 12/14/2019 03/25/19  Yes Johnson, Clanford L, MD  ergocalciferol (VITAMIN D2) 1.25 MG (50000 UT) capsule Take 1 capsule (50,000 Units total) by mouth once a week. Patient not taking: Reported on 12/14/2019 08/26/19  Yes Lockamy, Randi L, NP-C  ferrous sulfate 324 (65 Fe) MG TBEC TAKE 1 TABLET (325 MG TOTAL) BY MOUTH IN THE MORNING AND AT BEDTIME FOR 15 DAYS. Patient not taking: Reported on 12/14/2019 12/07/19 12/22/19 Yes Dettinger, Fransisca Kaufmann, MD  Magnesium 200 MG TABS Take 1 tablet (200 mg total) by mouth daily. Patient not taking: Reported on 12/14/2019 03/23/19  Yes Lendon Colonel, NP  Multiple Vitamin (MULTIVITAMIN) capsule Take 1 capsule by mouth daily. Patient not taking: Reported on 12/14/2019   Yes [provider]  bisoprolol (ZEBETA) 5 MG tablet Take 0.5 tablets (2.5 mg total) by mouth daily. Patient not taking: Reported on 11/30/2019 11/24/19   Roxan Hockey, MD  meclizine (ANTIVERT) 25 MG tablet Take 1 tablet (25 mg total) by mouth 3 (three) times daily as needed for dizziness. Patient not taking: Reported on 11/30/2019 05/05/19   Derek Jack, MD  ondansetron (ZOFRAN) 4 MG tablet Take 1 tablet (4 mg total) by mouth every 6 (six) hours as needed for nausea. Patient not taking: Reported on 11/30/2019 11/24/19   Roxan Hockey, MD  pantoprazole (PROTONIX) 40 MG tablet Take 1 tablet (40 mg total) by mouth daily. 12/12/19 01/11/20  Lattie Haw, MD  rosuvastatin (CRESTOR) 10 MG tablet TAKE 1 TABLET BY MOUTH AT  BEDTIME Patient not taking: Reported on 11/30/2019 05/14/19   [name], DO  thiamine (VITAMIN B-1) 100 MG tablet Take 100 mg by mouth daily. Patient not taking: Reported on 12/12/2019    [provider]  vitamin E 400 UNIT capsule Take 400 Units by mouth daily.  Patient not taking: Reported on 12/12/2019    [provider]    Allergies    Sulfa antibiotics  Review of Systems   Review of Systems  Constitutional: Negative for chills and fever.  Respiratory: Positive for shortness of breath. Negative for cough, chest tightness and wheezing.   Cardiovascular: Negative for chest pain, palpitations and leg swelling.  Gastrointestinal: Positive for anal bleeding. Negative for abdominal distention, abdominal pain, blood in stool, constipation, diarrhea, nausea, rectal pain and vomiting.  Genitourinary: Negative for difficulty urinating, dysuria, frequency, hematuria and urgency.  Skin: Negative for pallor and rash.  Neurological: Negative for dizziness and headaches.  Psychiatric/Behavioral: Negative.     Physical Exam Updated Vital Signs BP 113/74 (  BP Location: Right Arm)   Pulse 95   Temp 97.9 F (36.6 C) (Oral)   Resp 20   Ht 5\' 6"  (1.676 m)   Wt 90.7 kg   SpO2 96%   BMI 32.28 kg/m   Physical Exam Constitutional:      General: She is not in acute distress.    Appearance: She is well-developed. She is not ill-appearing or toxic-appearing.  HENT:     Head: Normocephalic and atraumatic.     Mouth/Throat:     Mouth: Mucous membranes are moist.  Eyes:     Extraocular Movements: Extraocular movements intact.  Cardiovascular:     Rate and Rhythm: Normal rate and regular rhythm.     Pulses: Normal pulses.     Heart sounds: Normal heart sounds, S1 normal and S2 normal.  Pulmonary:     Breath sounds: No decreased breath sounds, wheezing, rhonchi or rales.  Musculoskeletal:     Cervical back: Normal range of motion and neck supple.  Skin:    General: Skin is warm and dry.  Neurological:     General: No focal deficit present.     Mental Status: She is alert. Mental status is at baseline.     ED Results / Procedures / Treatments   Labs (all labs  ordered are listed, but only abnormal results are displayed) Labs Reviewed  CBC WITH DIFFERENTIAL/PLATELET - Abnormal; Notable for the following components:      Result Value   RBC 2.16 (*)    Hemoglobin 6.5 (*)    HCT 21.5 (*)    RDW 19.4 (*)    All other components within normal limits  COMPREHENSIVE METABOLIC PANEL - Abnormal; Notable for the following components:   Glucose, Bld 112 (*)    Calcium 8.4 (*)    Total Protein 5.8 (*)    Albumin 3.3 (*)    Alkaline Phosphatase 27 (*)    All other components within normal limits  CBC - Abnormal; Notable for the following components:   RBC 2.67 (*)    Hemoglobin 8.2 (*)    HCT 25.8 (*)    RDW 17.2 (*)    All other components within normal limits  POC OCCULT BLOOD, ED - Abnormal; Notable for the following components:   Fecal Occult Bld POSITIVE (*)    All other components within normal limits  TYPE AND SCREEN  PREPARE RBC (CROSSMATCH)    EKG EKG Interpretation  Date/Time:  Sunday December 12 2019 08:16:06 EDT Ventricular Rate:  86 PR Interval:  190 QRS Duration: 106 QT Interval:  408 QTC Calculation: 488 R Axis:   61 Text Interpretation: Normal sinus rhythm Incomplete left bundle branch block Left ventricular hypertrophy with repolarization abnormality ( Sokolow-Lyon ) Abnormal ECG Confirmed by Fredia Sorrow [phone]) on 12/12/2019 3:23:23 PM   Radiology No results found.  Procedures Procedures (including critical care time)  Medications Ordered in ED Medications  0.9 %  sodium chloride infusion (Manually program via Guardrails IV Fluids) (0 mLs Intravenous Stopped 12/12/19 1740)  pantoprazole (PROTONIX) injection 40 mg (40 mg Intravenous Given 12/12/19 1131)    ED Course  I have reviewed the triage vital signs and the nursing notes.  Pertinent labs & imaging results that were available during my care of the patient were reviewed by me and considered in my medical decision making (see chart for details).    MDM  Rules/Calculators/A&P  Stacey Reyes is 74 yr old with PMH of symptomatic anemia requiring transfusions, GI bleed, diverticulitis, hyperlipidemia, hypertension, colonic polyps, cardiac murmur presents with 5-day history of shortness of breath, fatigue and black stools.    Per chart review she was admitted from 8/30-09/01 for similar symptoms. She received 2 unit RBC. EGD showed no evidence of source of bleed. Capsule EGD possible lymphangiectasias noted in small bowel, duodenal erosions, obvious old blood in cecum. She was discharged with GI follow up for possible colonoscopy.  In the ED: Hb 6.5, FOBT positive. Prescribed 2 U RBC and post transfusion H&H/CBC.  Reviewed pt at 13:30. She has received half of 1 unit RBC. She is aware her second unit will not finish until later this afternoon and she will stay in for observation and post transfusion CBC.   Final Clinical Impression(s) / ED Diagnoses Final diagnoses:  Gastrointestinal hemorrhage, unspecified gastrointestinal hemorrhage type  Symptomatic anemia    Rx / DC Orders ED Discharge Orders         Ordered    pantoprazole (PROTONIX) 40 MG tablet  Daily        12/12/19 1448           Lattie Haw, MD 12/15/19 1451    Elnora Morrison, MD 12/17/19 [phone]

## 2019-12-12 NOTE — ED Provider Notes (Signed)
Hemoglobin following blood transfusions came back at 8.2.  Patient asymptomatic able to ambulate.  Original plan will be followed out with close follow-up.  Patient did not want admission.  This is been a chronic problem with GI blood loss.  She has appropriate specialty follow-up.   Fredia Sorrow, MD 12/12/19 2141

## 2019-12-12 NOTE — ED Triage Notes (Signed)
Pt states she has been feeling sob and weak x 5 days; pt states she feels like it is her hemoglobin and it is low; she has had to have blood transfusions in the past; pt states her stools have been black

## 2019-12-12 NOTE — ED Provider Notes (Signed)
ATTENDING SUPERVISORY NOTE I have personally seen and examined the patient, and discussed the plan of care with the resident physician.   I have reviewed the documentation of the resident and agree.   Gastrointestinal hemorrhage, unspecified gastrointestinal hemorrhage type   .Critical Care Performed by: Elnora Morrison, MD Authorized by: Elnora Morrison, MD   Critical care provider statement:    Critical care time (minutes):  40   Critical care start time:  12/12/2019 9:20 AM   Critical care end time:  12/12/2019 10:00 AM   Critical care time was exclusive of:  Separately billable procedures and treating other patients and teaching time   Critical care was time spent personally by me on the following activities:  Evaluation of patient's response to treatment, examination of patient, ordering and performing treatments and interventions, ordering and review of laboratory studies, ordering and review of radiographic studies, pulse oximetry, re-evaluation of patient's condition and review of old charts Comments:     Blood transfusion for symptomatic anemia      Elnora Morrison, MD 12/17/19 [phone]

## 2019-12-13 LAB — BPAM RBC
Blood Product Expiration Date: 202110202359
Blood Product Expiration Date: 202110202359
ISSUE DATE / TIME: 202109191126
ISSUE DATE / TIME: 202109191426
Unit Type and Rh: 5100
Unit Type and Rh: 5100

## 2019-12-13 LAB — TYPE AND SCREEN
ABO/RH(D): O POS
Antibody Screen: NEGATIVE
Unit division: 0
Unit division: 0

## 2019-12-13 NOTE — Telephone Encounter (Signed)
Patient was in the ED last night with Hgb 6.2.  Was given 2 units of blood.  She was sent home with a Hgb of 8.2.  She did not wish to be admitted and they sent her home.  She was told she needs to have a colonoscopy this week.  She has an office visit with Dr. Carlean Purl on 12/21/19.  The capsule endo is pending from her last admission. Report not in Epic but according to ED notes "Capsule EGD possible lymphangiectasias noted in small bowel, duodenal erosions, obvious old blood in cecum." NO APP appts this week.  Please advise as you are DOD.    Dr. Hilarie Fredrickson you are MD of the day

## 2019-12-13 NOTE — Telephone Encounter (Signed)
The capsule report is dictated and can be found in [facility] under notes I think

## 2019-12-13 NOTE — Telephone Encounter (Signed)
Patient has an appt with her PCP in am for blood count.  Patient is notified of the recommendations and that we will be in contact after the CBC and capsule endo review.

## 2019-12-13 NOTE — Telephone Encounter (Signed)
I spoke with Dr.Rourk about this last week. Report is complete and in epic, its a procedure note in the "notes" section of epic. [facility] called this morning and Manuela Schwartz tried to tell them where the report is located and she faxed a copy of the report to their office.

## 2019-12-13 NOTE — Addendum Note (Signed)
Addended by: Marlon Pel on: 12/13/2019 12:03 PM   Modules accepted: Orders

## 2019-12-13 NOTE — Telephone Encounter (Signed)
Patient called to advise she was just at the ED over the weekend GI blood loss seeking advise

## 2019-12-13 NOTE — Telephone Encounter (Signed)
Noted that patient was recently in the ER with heme positive stool and recently admitted at [facility]. We need to get the formal video capsule endoscopy report from [facility].  We should contact their practice and request the formal report, Gavin Potters can help here if needed She had a colonoscopy without source of blood loss 1 year ago with Dr. Carlean Purl Repeat colonoscopy could be considered however would like to review the capsule report first She very likely should have remained in the ER and that recently admitted for observation where endoscopic procedures would have been more readily available Given that she is now home I would recommend we repeat CBC tomorrow to reevaluate posttransfusion Further recommendations pending blood count and capsule report

## 2019-12-13 NOTE — Telephone Encounter (Signed)
Daneil Dolin, MD  Physician  Gastroenterology  Procedures    Addendum  Date of Service:  11/24/2019 9:51 AM        Procedures  CAPSULE ENDOSCOPY [[id number]]       Show:Clear all [x] Manual[x] Template[x] Copied  Added by: [x] Annitta Needs, NP[x] Rourk, Cristopher Estimable, MD  [] Hover for details Small Bowel [place] Capsule Study Procedure date:  11/22/29  Referring Provider:  Dr. Gala Romney  PCP:  Dr. [name], DO  Indication for procedure:   74 year old female with history of IDA s/p evaluation August 2020 in [place] by Dr. Carlean Purl including EGD and colonoscopy, followed by Hematology requiring iron infusions, presenting with profound symptomatic anemia with Hgb 5.9, reported melena, and heme positive stool. No NSAIDs or anticoagulation. Incidentally found to have acute, uncomplicated sigmoid diverticulitis on CT 11/14/19, with clinical improvement at time of admission. EGD completed 8/31 with normal esophagus, small hiatal hernia, innocent-appearing duodenal erosions. Capsule study now to be done to evaluate for small bowel source. Today, Hgb 7.0, receiving total of 2 units PRBCs.   Findings:   Capsule study complete to the cecum. Benign-appearing duodenal erosions without active bleeding noted (00:08:49). Interesting appearing small bowel polypoid-like lesions without bleeding on multiple images best seen starting at 00:56:47. Just as capsule entered cecum, large amounts of old blood noted without obvious source on multiple images. Slightly lighter dark red blood noted on images starting at 00:48:44 most noticeably.   First Duodenal image: 00:03:38 First Cecal image: 04:15:14 Small Bowel Passage time:  4h 33m  Summary & Recommendations: Acute GI bleeding without obvious upper or small bowel source and notable old blood mixed with some lighter dark red blood in colon just after capsule entered cecum on multiple images. No obvious lesion noted and query diverticular origin.  Duodenal erosions noted but would not account for significant acute blood loss anemia, and no obvious old blood was seen in small bowel. Interesting small bowel variants as noted above were seen, which could be evaluated further with CTE in an elective setting.  Patient recently had CT documented sigmoid diverticulitis, uncomplicated, with clinical improvement when seen for consultation this admission. Known diverticula on prior colonoscopy Aug 2020.   Report to be discussed with Dr. Gala Romney, with further recommendations to follow.  Annitta Needs, PhD, ANP-BC [facility] Gastroenterology    Addendum: possible lymphangiectasias noted in small bowel. Obvious old blood in cecum as described.   Follow clinically. Needs additional 1 unit PRBCs and follow another 24 hours. Stay on clear liquids for now. At some point will need ileocolonoscopy, with timing to be determined, especially in light of recent bout with diverticulitis. Patient denies any overt bleeding, no abdominal pain, and dyspnea on exertion/shortness of breath has resolved.  Annitta Needs, PhD, ANP-BC [facility] Gastroenterology   Attending note: Agree with above.  Pertinent images reviewed.    Electronically signed by Annitta Needs, NP at 11/24/2019 10:08 AM  Electronically signed by Annitta Needs, NP at 11/24/2019 10:45 AM  Electronically signed by Daneil Dolin, MD at 11/24/2019 11:30 AM  Electronically signed by Daneil Dolin, MD at 11/24/2019 11:30 AM

## 2019-12-14 ENCOUNTER — Other Ambulatory Visit: Payer: Self-pay

## 2019-12-14 ENCOUNTER — Encounter: Payer: Self-pay | Admitting: Nurse Practitioner

## 2019-12-14 ENCOUNTER — Ambulatory Visit (INDEPENDENT_AMBULATORY_CARE_PROVIDER_SITE_OTHER): Payer: Medicare Other | Admitting: Nurse Practitioner

## 2019-12-14 ENCOUNTER — Ambulatory Visit: Payer: Medicare Other | Admitting: Nurse Practitioner

## 2019-12-14 ENCOUNTER — Telehealth: Payer: Self-pay | Admitting: Internal Medicine

## 2019-12-14 VITALS — BP 150/74 | HR 71 | Temp 97.5°F | Resp 20 | Ht 66.0 in | Wt 199.0 lb

## 2019-12-14 DIAGNOSIS — D62 Acute posthemorrhagic anemia: Secondary | ICD-10-CM | POA: Diagnosis not present

## 2019-12-14 DIAGNOSIS — Z23 Encounter for immunization: Secondary | ICD-10-CM | POA: Insufficient documentation

## 2019-12-14 DIAGNOSIS — D649 Anemia, unspecified: Secondary | ICD-10-CM | POA: Diagnosis not present

## 2019-12-14 LAB — HEMOGLOBIN, FINGERSTICK: Hemoglobin: 9.3 g/dL — ABNORMAL LOW (ref 11.1–15.9)

## 2019-12-14 MED ORDER — FLUCONAZOLE 150 MG PO TABS: 150.0000 mg | ORAL_TABLET | Freq: Once | ORAL | 2 refills | Status: AC

## 2019-12-14 NOTE — Progress Notes (Signed)
Established Patient Office Visit  Subjective:  Patient ID: Stacey Reyes, female    DOB: 01/19/1946  Age: 74 y.o. MRN: [id number]  CC:  Chief Complaint  Patient presents with   Recheck hemoglobin    HPI Stacey Reyes is a 74 year old patient who is in clinic today following up for recheck hemoglobin after ED visit for blood loss anemia.  This is not new for patient.  Patient reports in the past having low hemoglobin.  In the ED patient's hemoglobin was 6.5, patient was transfused with 1 unit packed red blood cells that brought her hemoglobin to 8.2 patient did not want hospital admission, discharge instruction was to follow-up outpatient to recheck hemoglobin.  Recheck fingerstick hemoglobin today is 9.  Patient hemoglobin is improving but not where it's supposed to be.  Patient is followed by gastroenterology.  Patient reports feeling better today and not as tired.  Patient is not dizzy, no dyspnea with exertion, no lightheadedness.  Completed hospital discharge instructions advised patient to take medication as prescribed especially iron and B12.  Past Medical History:  Diagnosis Date   Anemia    Cataract    removed bilateral   Chronic cystitis    Heart murmur    Hyperlipidemia    Hypertension    Personal history of colonic polyps-adenomas 01/07/2012   2009 - 2 diminutive adenomas (prior polyps also) 01/07/2012 - 2 diminutive adenomas      Past Surgical History:  Procedure Laterality Date   BREAST BIOPSY Right    No Scar seen    COLONOSCOPY  multiple   ESOPHAGOGASTRODUODENOSCOPY (EGD) WITH PROPOFOL N/A 11/23/2019   Procedure: ESOPHAGOGASTRODUODENOSCOPY (EGD) WITH PROPOFOL;  Surgeon: Daneil Dolin, MD;  [facility];  Service: Endoscopy;  Laterality: N/A;   GIVENS CAPSULE STUDY N/A 11/23/2019   Procedure: GIVENS CAPSULE STUDY;  Surgeon: Daneil Dolin, MD;  Location: [facility];  Service: Endoscopy;  Laterality: N/A;    Family History    Problem Relation Age of Onset   Colon cancer Mother 70       80's   Breast cancer Sister    Asthma Brother    Colon polyps Neg Hx    Esophageal cancer Neg Hx    Rectal cancer Neg Hx    Stomach cancer Neg Hx     Social History   Socioeconomic History   Marital status: Married    Spouse name: Not on file   Number of children: 1   Years of education: Not on file   Highest education level: Not on file  Occupational History   Occupation: retired  Tobacco Use   Smoking status: Former Smoker    Quit date: 12/23/1984    Years since quitting: 34.9   Smokeless tobacco: Never Used  Scientific laboratory technician Use: Never used  Substance and Sexual Activity   Alcohol use: Yes    Comment: rare   Drug use: No   Sexual activity: Not on file  Other Topics Concern   Not on file  Social History Narrative   Patient is married and retired and has 1 grown child   Social Determinants of Radio broadcast assistant Strain:    Difficulty of Paying Living Expenses: Not on file  Food Insecurity:    Worried About Charity fundraiser in the Last Year: Not on file   [employer] of Food in the Last Year: Not on file  Transportation Needs:    Lack  of Transportation (Medical): Not on file   Lack of Transportation (Non-Medical): Not on file  Physical Activity:    Days of Exercise per Week: Not on file   Minutes of Exercise per Session: Not on file  Stress:    Feeling of Stress : Not on file  Social Connections:    Frequency of Communication with Friends and Family: Not on file   Frequency of Social Gatherings with Friends and Family: Not on file   Attends Religious Services: Not on file   Active Member of Clubs or Organizations: Not on file   Attends Archivist Meetings: Not on file   Marital Status: Not on file  Intimate Partner Violence:    Fear of Current or Ex-Partner: Not on file   Emotionally Abused: Not on file   Physically Abused: Not on file    Sexually Abused: Not on file    Outpatient Medications Prior to Visit  Medication Sig Dispense Refill   pantoprazole (PROTONIX) 40 MG tablet Take 1 tablet (40 mg total) by mouth daily. 30 tablet 0   acetaminophen (TYLENOL) 325 MG tablet Take 2 tablets (650 mg total) by mouth every 6 (six) hours as needed for mild pain (or Fever >/= 101). (Patient not taking: Reported on 12/14/2019) 12 tablet 0   bisoprolol (ZEBETA) 5 MG tablet Take 0.5 tablets (2.5 mg total) by mouth daily. (Patient not taking: Reported on 11/30/2019) 45 tablet 3   Copper Gluconate (COPPER CAPS PO) Take 1 capsule by mouth daily. (Patient not taking: Reported on 12/14/2019)     cyanocobalamin ([employer] VITAMIN B12) 2000 MCG tablet Take 1 tablet (2,000 mcg total) by mouth daily. (Patient not taking: Reported on 12/14/2019)     ergocalciferol (VITAMIN D2) 1.25 MG (50000 UT) capsule Take 1 capsule (50,000 Units total) by mouth once a week. (Patient not taking: Reported on 12/14/2019) 16 capsule 3   ferrous sulfate 324 (65 Fe) MG TBEC TAKE 1 TABLET (325 MG TOTAL) BY MOUTH IN THE MORNING AND AT BEDTIME FOR 15 DAYS. (Patient not taking: Reported on 12/14/2019) 30 tablet 0   Magnesium 200 MG TABS Take 1 tablet (200 mg total) by mouth daily. (Patient not taking: Reported on 12/14/2019) 30 tablet 6   meclizine (ANTIVERT) 25 MG tablet Take 1 tablet (25 mg total) by mouth 3 (three) times daily as needed for dizziness. (Patient not taking: Reported on 11/30/2019) 30 tablet 1   Multiple Vitamin (MULTIVITAMIN) capsule Take 1 capsule by mouth daily. (Patient not taking: Reported on 12/14/2019)     ondansetron (ZOFRAN) 4 MG tablet Take 1 tablet (4 mg total) by mouth every 6 (six) hours as needed for nausea. (Patient not taking: Reported on 11/30/2019) 20 tablet 0   rosuvastatin (CRESTOR) 10 MG tablet TAKE 1 TABLET BY MOUTH AT  BEDTIME (Patient not taking: Reported on 11/30/2019) 90 tablet 3   thiamine (VITAMIN B-1) 100 MG tablet Take 100 mg by mouth  daily. (Patient not taking: Reported on 12/12/2019)     vitamin E 400 UNIT capsule Take 400 Units by mouth daily.  (Patient not taking: Reported on 12/12/2019)     No facility-administered medications prior to visit.    Allergies  Allergen Reactions   Sulfa Antibiotics Rash    ROS Review of Systems  Gastrointestinal: Negative for abdominal distention, abdominal pain, blood in stool, nausea and vomiting.  Skin: Negative for color change.  Neurological: Negative for dizziness and headaches.  All other systems reviewed and are negative.  Objective:    Physical Exam Vitals reviewed.  Constitutional:      Appearance: Normal appearance.  HENT:     Head: Normocephalic.  Eyes:     Conjunctiva/sclera: Conjunctivae normal.  Cardiovascular:     Rate and Rhythm: Normal rate and regular rhythm.     Pulses: Normal pulses.     Heart sounds: Normal heart sounds.  Pulmonary:     Effort: Pulmonary effort is normal.     Breath sounds: Normal breath sounds.  Abdominal:     General: Bowel sounds are normal.  Musculoskeletal:        General: Normal range of motion.     Cervical back: Normal range of motion.  Skin:    General: Skin is warm.  Neurological:     Mental Status: She is alert and oriented to person, place, and time.     Motor: No weakness.  Psychiatric:        Mood and Affect: Mood normal.        Behavior: Behavior normal.     BP (!) 150/74    Pulse 71    Temp (!) 97.5 F (36.4 C) (Temporal)    Resp 20    Ht 5\' 6"  (1.676 m)    Wt 199 lb (90.3 kg)    SpO2 98%    BMI 32.12 kg/m  Wt Readings from Last 3 Encounters:  12/14/19 199 lb (90.3 kg)  12/12/19 200 lb (90.7 kg)  11/30/19 199 lb (90.3 kg)     There are no preventive care reminders to display for this patient.  There are no preventive care reminders to display for this patient.  Lab Results  Component Value Date   TSH 1.020 03/23/2019   Lab Results  Component Value Date   WBC 5.9 12/12/2019   HGB 8.2  (L) 12/12/2019   HCT 25.8 (L) 12/12/2019   MCV 96.6 12/12/2019   PLT 181 12/12/2019   Lab Results  Component Value Date   NA 136 12/12/2019   K 3.7 12/12/2019   CO2 23 12/12/2019   GLUCOSE 112 (H) 12/12/2019   BUN 11 12/12/2019   CREATININE 0.50 12/12/2019   BILITOT 0.5 12/12/2019   ALKPHOS 27 (L) 12/12/2019   AST 23 12/12/2019   ALT 15 12/12/2019   PROT 5.8 (L) 12/12/2019   ALBUMIN 3.3 (L) 12/12/2019   CALCIUM 8.4 (L) 12/12/2019   ANIONGAP 7 12/12/2019   Lab Results  Component Value Date   CHOL 185 12/05/2011   Lab Results  Component Value Date   HDL 44 12/05/2011   Lab Results  Component Value Date   LDLCALC 110 12/05/2011   Lab Results  Component Value Date   TRIG 187 (A) 12/05/2011   No results found for: [facility] Lab Results  Component Value Date   HGBA1C 5.6 02/24/2015      Assessment & Plan:   Problem List Items Addressed This Visit      Other   Acute blood loss anemia - Primary    Patient is following up for recheck hemoglobin.  Patient was in hospital for acute blood loss anemia.  Patient hemoglobin is up to 9 today after recheck fingerstick hemoglobin.  Patient is not reporting any lightheadedness, tiredness or fatigue.  Patient reports feeling much better today.  Completed discharge instruction, with education provided and printed handouts given.  Advised patient to take medications as prescribed especially iron and B12.  Advised patient to drink lots of water and what to expect  when taking iron tablets which could lead to dark stools and constipation. Patient is followed up by gastroenterology outpatient Follow-up hemoglobin check in 2 weeks, and 4 weeks or follow-up with increased fatigue or blood in stool.        Relevant Orders   Hemoglobin, fingerstick (Completed)   Need for immunization against influenza   Relevant Orders   Flu Vaccine QUAD High Dose(Fluad) (Completed)      Meds ordered this encounter  Medications   fluconazole  (DIFLUCAN) 150 MG tablet    Sig: Take 1 tablet (150 mg total) by mouth once for 1 dose.    Dispense:  1 tablet    Refill:  2    Order Specific Question:   Supervising Provider    Answer:   Caryl Pina A [[id number]]    Follow-up: Return if symptoms worsen or fail to improve.    Ivy Lynn, NP

## 2019-12-14 NOTE — Telephone Encounter (Signed)
Patient notified that Hgb has improved to 9.3.  She is advised that Dr. Hilarie Fredrickson will review and advise.  She is also advised that she will keep the office visit appt on 12/21/19 2:10 with Dr. Carlean Purl unless I call her with different plans.

## 2019-12-14 NOTE — Patient Instructions (Signed)

## 2019-12-14 NOTE — Assessment & Plan Note (Signed)
Patient is following up for recheck hemoglobin.  Patient was in hospital for acute blood loss anemia.  Patient hemoglobin is up to 9 today after recheck fingerstick hemoglobin.  Patient is not reporting any lightheadedness, tiredness or fatigue.  Patient reports feeling much better today.  Completed discharge instruction, with education provided and printed handouts given.  Advised patient to take medications as prescribed especially iron and B12.  Advised patient to drink lots of water and what to expect when taking iron tablets which could lead to dark stools and constipation. Patient is followed up by gastroenterology outpatient Follow-up hemoglobin check in 2 weeks, and 4 weeks or follow-up with increased fatigue or blood in stool.

## 2019-12-14 NOTE — Telephone Encounter (Signed)
Hgb 9.3 this am at her PCP.  They did not do a full CBC, just a fingerstick.  Do you want me to have her come for the CBC?  See capsule results below.

## 2019-12-15 NOTE — Telephone Encounter (Signed)
I recommend that she keep her appointment as scheduled with Dr. Carlean Purl next week Hemoglobin 9.3 which is improved from ER If dramatic change in patient condition prior to follow-up then she should be seen in the ER

## 2019-12-16 NOTE — Telephone Encounter (Signed)
Patient notified

## 2019-12-20 ENCOUNTER — Other Ambulatory Visit: Payer: Self-pay | Admitting: Family Medicine

## 2019-12-20 DIAGNOSIS — D62 Acute posthemorrhagic anemia: Secondary | ICD-10-CM

## 2019-12-21 ENCOUNTER — Ambulatory Visit: Payer: Medicare Other | Admitting: Internal Medicine

## 2019-12-21 ENCOUNTER — Encounter: Payer: Self-pay | Admitting: Internal Medicine

## 2019-12-21 VITALS — BP 120/68 | HR 90 | Ht 66.0 in | Wt 197.0 lb

## 2019-12-21 DIAGNOSIS — K921 Melena: Secondary | ICD-10-CM

## 2019-12-21 DIAGNOSIS — D500 Iron deficiency anemia secondary to blood loss (chronic): Secondary | ICD-10-CM | POA: Insufficient documentation

## 2019-12-21 DIAGNOSIS — K2980 Duodenitis without bleeding: Secondary | ICD-10-CM

## 2019-12-21 DIAGNOSIS — D509 Iron deficiency anemia, unspecified: Secondary | ICD-10-CM | POA: Diagnosis not present

## 2019-12-21 MED ORDER — SUTAB 1479-225-188 MG PO TABS: 1.0000 | ORAL_TABLET | ORAL | 0 refills | Status: DC

## 2019-12-21 NOTE — Patient Instructions (Signed)
You have been scheduled for a colonoscopy. Please follow written instructions given to you at your visit today.  Your prep kit has been sent to [facility] per your request. If you use inhalers (even only as needed), please bring them with you on the day of your procedure.   Please hold your iron for 3 days prior to your colonoscopy.   I appreciate the opportunity to care for you. Silvano Rusk, MD, [facility]

## 2019-12-21 NOTE — Progress Notes (Signed)
Stacey Reyes 74 y.o. 07-01-1945 [id number]  Assessment & Plan:   Encounter Diagnoses  Name Primary?  . Melena Yes  . Iron deficiency anemia, unspecified iron deficiency anemia type   . Duodenitis     Recurrent problems with iron deficiency anemia that must be from blood loss the question is where in the GI tract and from what.  So far 2 EGDs and a colonoscopy and a capsule endoscopy have failed to reveal an obvious cause.  Duodenal erosions have been noted twice last year and this year.  She denies taking NSAIDs salicylates and she is not on anticoagulants either.   Though the colonoscopy I performed in August 2020 failed to reveal any significant lesions it certainly possible something was not seen or something is developed in the interim so given her clinical course I do agree with Dr. Roseanne Reyes recommendation to proceed with a colonoscopy and terminal ileal intubation.  I am currently unable to perform that due to an injury so this will be arranged with my partner Dr. Hilarie Reyes and I will assume follow-up after that.  If the colonoscopy is unrevealing I do think I would consider repeating a small bowel capsule endoscopy and it would be my preference that we perform that at the hospital with the [facility] capsule.  I appreciate the opportunity to care for this patient. CC: Stacey Norlander, DO    Subjective:   Chief Complaint: Recurrent iron deficiency anemia and melena  HPI The patient is a 74 year old white woman with a history of iron deficiency anemia seen by me in August 2020 with an unrevealing EGD and colonoscopy.  Subsequently she was treated with parenteral iron and followed in hematology and then within the last few weeks has had problems with melena and significant anemia.  She was admitted 11/22/2019 at [facility] and had a hemoglobin of 5.9 it was 11.9 the week prior.  She was given 2 units of packed red cells.  Note that in August 2020 she had erosive  duodenitis and negative celiac and H. pylori testing/biopsies.  2 adenomas and diverticulosis on colonoscopy.  On November 23, 2019 Dr. Gala Romney performed EGD she had some duodenal erosions and a small hiatal hernia.  A capsule endoscopy was deployed.  The capsule demonstrated the duodenal erosions and some polypoid lesions in the small bowel that were thought to be lymphangiectasia.  Dark red blood was seen in about 48 minutes into the study.  Darker red blood was seen in the cecum as well.  She was discharged and then had some more dark stools had a hemoglobin of 8.2 was some dyspnea when she was in the emergency room on September 19 and was transfused 2 more units.  Follow-up hemoglobin the next day was 9.3.  Since that time stools are dark she feels fatigued but is not as bad as she was not think she is overall slightly improved.  There are plans to follow-up at hemoglobin in about a week.  She is not having melena anymore but the stools are dark while on oral iron.  A week prior to this hospitalization in 8/21 she had had sigmoid diverticulitis diagnosed by CT scan and was treated with Augmentin.  She was heme positive then.  That is when the hemoglobin was 11.9.  Ferritin in May 2021 was 48.   She was last seen in hematology clinic at [facility] with the following note assessment on 08/26/2019 and had to iron infusion subsequent to that.  These were in June over the following weeks.  She also has B12 deficiency but that level has been fine and was 380 on August 30.  ASSESSMENT & PLAN:  Symptomatic anemia 1.  Severe iron deficiency anemia: -Admitted on 03/24/2019 with hemoglobin 5.3 and ferritin of 6 and B12 of 145.  Status post 3 units of PRBC and 1 infusion of Feraheme on 03/24/2019. -Colonoscopy on 11/17/2018 showed 2 diminutive polyps in the transverse colon, diverticulosis in the sigmoid colon and descending colon and transverse colon. -EGD showed duodenal erosions. -Stool for occult blood was  negative x3. -Work-up including: SPEP was negative, B12 was normal, copper was slightly low at 67.  Folic acid was normal.  Hemoglobin 11.2 and ferritin 32.  She was given 2 infusions of Feraheme. -Labs done on 08/19/2019 showed hemoglobin 13.5, ferritin 48, percent saturation 26 -Due to her severe fatigue and drop in ferritin we will go ahead and give her 2 more infusions of IV iron. -She will follow-up in 5 months with repeat labs.  Allergies  Allergen Reactions  . Sulfa Antibiotics Rash   Current Meds  Medication Sig  . ergocalciferol (VITAMIN D2) 1.25 MG (50000 UT) capsule Take 1 capsule (50,000 Units total) by mouth once a week.  . ferrous sulfate 324 (65 Fe) MG TBEC TAKE 1 TABLET (325 MG TOTAL) BY MOUTH IN THE MORNING AND AT BEDTIME FOR 15 DAYS.  Marland Kitchen pantoprazole (PROTONIX) 40 MG tablet Take 1 tablet (40 mg total) by mouth daily.   Past Medical History:  Diagnosis Date  . Anemia   . Cataract    removed bilateral  . Chronic cystitis   . Diverticulitis   . Heart murmur   . Hyperlipidemia   . Hypertension   . Personal history of colonic polyps-adenomas 01/07/2012   2009 - 2 diminutive adenomas (prior polyps also) 01/07/2012 - 2 diminutive adenomas     Past Surgical History:  Procedure Laterality Date  . BREAST BIOPSY Right    No Scar seen   . COLONOSCOPY  multiple  . ESOPHAGOGASTRODUODENOSCOPY (EGD) WITH PROPOFOL N/A 11/23/2019   Procedure: ESOPHAGOGASTRODUODENOSCOPY (EGD) WITH PROPOFOL;  Surgeon: Daneil Dolin, MD;  [facility];  Service: Endoscopy;  Laterality: N/A;  . GIVENS CAPSULE STUDY N/A 11/23/2019   Procedure: GIVENS CAPSULE STUDY;  Surgeon: Daneil Dolin, MD;  [facility]: [facility];  Service: Endoscopy;  Laterality: N/A;   Social History   Social History Narrative   Patient is married and retired and has 1 grown child   family history includes Asthma in her brother; Breast cancer in her sister; Colon cancer (age of onset: 78) in her  mother.   Review of Systems As above  Objective:   Physical Exam BP 120/68   Pulse 90   Ht 5\' 6"  (1.676 m)   Wt 197 lb (89.4 kg)   BMI 31.80 kg/m  Well-developed well-nourished elderly white woman no acute distress Lungs are clear 2/6 systolic ejection murmur otherwise normal heart sounds

## 2019-12-23 ENCOUNTER — Encounter: Payer: Self-pay | Admitting: Family Medicine

## 2019-12-23 ENCOUNTER — Other Ambulatory Visit: Payer: Self-pay | Admitting: Nurse Practitioner

## 2019-12-23 DIAGNOSIS — D62 Acute posthemorrhagic anemia: Secondary | ICD-10-CM

## 2019-12-23 DIAGNOSIS — D649 Anemia, unspecified: Secondary | ICD-10-CM

## 2019-12-24 ENCOUNTER — Other Ambulatory Visit: Payer: Medicare Other

## 2019-12-24 ENCOUNTER — Other Ambulatory Visit (HOSPITAL_COMMUNITY): Payer: Self-pay

## 2019-12-24 ENCOUNTER — Other Ambulatory Visit: Payer: Self-pay

## 2019-12-24 DIAGNOSIS — D649 Anemia, unspecified: Secondary | ICD-10-CM | POA: Diagnosis not present

## 2019-12-24 DIAGNOSIS — E559 Vitamin D deficiency, unspecified: Secondary | ICD-10-CM

## 2019-12-24 LAB — HEMOGLOBIN, FINGERSTICK: Hemoglobin: 11.6 g/dL (ref 11.1–15.9)

## 2019-12-27 ENCOUNTER — Other Ambulatory Visit: Payer: Self-pay

## 2019-12-27 ENCOUNTER — Inpatient Hospital Stay (HOSPITAL_COMMUNITY): Payer: Medicare Other | Attending: Hematology

## 2019-12-27 DIAGNOSIS — E559 Vitamin D deficiency, unspecified: Secondary | ICD-10-CM

## 2019-12-27 DIAGNOSIS — D649 Anemia, unspecified: Secondary | ICD-10-CM

## 2019-12-27 DIAGNOSIS — E538 Deficiency of other specified B group vitamins: Secondary | ICD-10-CM | POA: Diagnosis not present

## 2019-12-27 DIAGNOSIS — D509 Iron deficiency anemia, unspecified: Secondary | ICD-10-CM | POA: Insufficient documentation

## 2019-12-27 LAB — IRON AND TIBC
Iron: 43 ug/dL (ref 28–170)
Saturation Ratios: 11 % (ref 10.4–31.8)
TIBC: 400 ug/dL (ref 250–450)
UIBC: 357 ug/dL

## 2019-12-27 LAB — COMPREHENSIVE METABOLIC PANEL
ALT: 18 U/L (ref 0–44)
AST: 26 U/L (ref 15–41)
Albumin: 3.8 g/dL (ref 3.5–5.0)
Alkaline Phosphatase: 36 U/L — ABNORMAL LOW (ref 38–126)
Anion gap: 9 (ref 5–15)
BUN: 10 mg/dL (ref 8–23)
CO2: 25 mmol/L (ref 22–32)
Calcium: 8.9 mg/dL (ref 8.9–10.3)
Chloride: 101 mmol/L (ref 98–111)
Creatinine, Ser: 0.61 mg/dL (ref 0.44–1.00)
GFR calc Af Amer: >60 mL/min (ref >60)
GFR calc non Af Amer: >60 mL/min (ref >60)
Glucose, Bld: 105 mg/dL — ABNORMAL HIGH (ref 70–99)
Potassium: 3.7 mmol/L (ref 3.5–5.1)
Sodium: 135 mmol/L (ref 135–145)
Total Bilirubin: 0.5 mg/dL (ref 0.3–1.2)
Total Protein: 5.9 g/dL — ABNORMAL LOW (ref 6.5–8.1)

## 2019-12-27 LAB — CBC WITH DIFFERENTIAL/PLATELET
Abs Immature Granulocytes: 0 10*3/uL (ref 0.00–0.07)
Basophils Absolute: 0.1 10*3/uL (ref 0.0–0.1)
Basophils Relative: 1 %
Eosinophils Absolute: 0.2 10*3/uL (ref 0.0–0.5)
Eosinophils Relative: 4 %
HCT: 31.9 % — ABNORMAL LOW (ref 36.0–46.0)
Hemoglobin: 10 g/dL — ABNORMAL LOW (ref 12.0–15.0)
Immature Granulocytes: 0 %
Lymphocytes Relative: 33 %
Lymphs Abs: 1.9 10*3/uL (ref 0.7–4.0)
MCH: 28 pg (ref 26.0–34.0)
MCHC: 31.3 g/dL (ref 30.0–36.0)
MCV: 89.4 fL (ref 80.0–100.0)
Monocytes Absolute: 0.6 10*3/uL (ref 0.1–1.0)
Monocytes Relative: 10 %
Neutro Abs: 3.1 10*3/uL (ref 1.7–7.7)
Neutrophils Relative %: 52 %
Platelets: 274 10*3/uL (ref 150–400)
RBC: 3.57 MIL/uL — ABNORMAL LOW (ref 3.87–5.11)
RDW: 15.2 % (ref 11.5–15.5)
WBC: 5.9 10*3/uL (ref 4.0–10.5)
nRBC: 0 % (ref 0.0–0.2)

## 2019-12-27 LAB — VITAMIN B12: Vitamin B-12: 658 pg/mL (ref 180–914)

## 2019-12-27 LAB — FOLATE: Folate: 6.7 ng/mL (ref >5.9)

## 2019-12-27 LAB — VITAMIN D 25 HYDROXY (VIT D DEFICIENCY, FRACTURES): Vit D, 25-Hydroxy: 56.63 ng/mL (ref 30–100)

## 2019-12-27 LAB — FERRITIN: Ferritin: 19 ng/mL (ref 11–307)

## 2019-12-27 LAB — LACTATE DEHYDROGENASE: LDH: 164 U/L (ref 98–192)

## 2019-12-27 NOTE — Addendum Note (Signed)
Addended by: Verlin Dike on: 12/27/2019 02:14 PM   Modules accepted: Orders

## 2019-12-28 ENCOUNTER — Other Ambulatory Visit (INDEPENDENT_AMBULATORY_CARE_PROVIDER_SITE_OTHER): Payer: Medicare Other | Admitting: Nurse Practitioner

## 2019-12-28 DIAGNOSIS — R195 Other fecal abnormalities: Secondary | ICD-10-CM

## 2019-12-28 LAB — FECAL OCCULT BLOOD, IMMUNOCHEMICAL: Fecal Occult Bld: POSITIVE — AB

## 2019-12-29 ENCOUNTER — Other Ambulatory Visit: Payer: Self-pay

## 2019-12-29 ENCOUNTER — Inpatient Hospital Stay (HOSPITAL_COMMUNITY): Payer: Medicare Other | Admitting: Hematology

## 2019-12-29 VITALS — BP 155/73 | HR 82 | Temp 96.8°F | Resp 17 | Wt 200.4 lb

## 2019-12-29 DIAGNOSIS — D509 Iron deficiency anemia, unspecified: Secondary | ICD-10-CM | POA: Diagnosis not present

## 2019-12-29 DIAGNOSIS — E538 Deficiency of other specified B group vitamins: Secondary | ICD-10-CM | POA: Diagnosis not present

## 2019-12-29 MED ORDER — FERROUS SULFATE 324 (65 FE) MG PO TBEC: 1.0000 | DELAYED_RELEASE_TABLET | Freq: Two times a day (BID) | ORAL | 0 refills | Status: DC

## 2019-12-29 NOTE — Progress Notes (Signed)
Stacey Reyes, [place] [id number]   CLINIC:  Medical Oncology/Hematology  PCP:  [name], DO [address] [place] [address]  [phone]  REASON FOR VISIT:  Follow-up for IDA  PRIOR THERAPY: None  CURRENT THERAPY: Intermittent Feraheme  INTERVAL HISTORY:  Stacey Reyes, a 74 y.o. female, returns for routine follow-up for her IDA. Stacey Reyes was last seen on 05/05/2019.  Today she reports feeling okay. She is feeling SOB with exertion which usually occurs when her hemoglobin drops. She recently had FOBT on 10/4 which was positive. Her stool is black, but denies blood in stool. She received 2 units of blood on 9/19. She continues taking an iron tablet BID and denies abdominal pain or constipation. She tolerated the previous iron infusion well.  She is planning on going on a trip on 10/16 and returning on 10/23. She is scheduled for a colonoscopy on 11/2.   REVIEW OF SYSTEMS:  Review of Systems  Constitutional: Positive for fatigue (75%). Negative for appetite change.  Respiratory: Positive for shortness of breath (w/ exertion).   Gastrointestinal: Negative for abdominal pain, blood in stool and constipation.  All other systems reviewed and are negative.   PAST MEDICAL/SURGICAL HISTORY:  Past Medical History:  Diagnosis Date  . Anemia   . Cataract    removed bilateral  . Chronic cystitis   . Diverticulitis   . Heart murmur   . Hyperlipidemia   . Hypertension   . Personal history of colonic polyps-adenomas 01/07/2012   2009 - 2 diminutive adenomas (prior polyps also) 01/07/2012 - 2 diminutive adenomas     Past Surgical History:  Procedure Laterality Date  . BREAST BIOPSY Right    No Scar seen   . COLONOSCOPY  multiple  . ESOPHAGOGASTRODUODENOSCOPY (EGD) WITH PROPOFOL N/A 11/23/2019   Procedure: ESOPHAGOGASTRODUODENOSCOPY (EGD) WITH PROPOFOL;  Surgeon: Daneil Dolin, MD;  [facility];  Service:  Endoscopy;  Laterality: N/A;  . GIVENS CAPSULE STUDY N/A 11/23/2019   Procedure: GIVENS CAPSULE STUDY;  Surgeon: Daneil Dolin, MD;  Location: [facility];  Service: Endoscopy;  Laterality: N/A;    SOCIAL HISTORY:  Social History   Socioeconomic History  . Marital status: Married    Spouse name: Not on file  . Number of children: 1  . Years of education: Not on file  . Highest education level: Not on file  Occupational History  . Occupation: retired  Tobacco Use  . Smoking status: Former Smoker    Quit date: 12/23/1984    Years since quitting: 35.0  . Smokeless tobacco: Never Used  Vaping Use  . Vaping Use: Never used  Substance and Sexual Activity  . Alcohol use: Yes    Comment: rare  . Drug use: No  . Sexual activity: Not on file  Other Topics Concern  . Not on file  Social History Narrative   Patient is married and retired and has 1 grown child   Social Determinants of Radio broadcast assistant Strain:   . Difficulty of Paying Living Expenses: Not on file  Food Insecurity:   . Worried About Charity fundraiser in the Last Year: Not on file  . Ran Out of Food in the Last Year: Not on file  Transportation Needs:   . Lack of Transportation (Medical): Not on file  . Lack of Transportation (Non-Medical): Not on file  Physical Activity:   . Days of  Exercise per Week: Not on file  . Minutes of Exercise per Session: Not on file  Stress:   . Feeling of Stress : Not on file  Social Connections:   . Frequency of Communication with Friends and Family: Not on file  . Frequency of Social Gatherings with Friends and Family: Not on file  . Attends Religious Services: Not on file  . Active Member of Clubs or Organizations: Not on file  . Attends Archivist Meetings: Not on file  . Marital Status: Not on file  Intimate Partner Violence:   . Fear of Current or Ex-Partner: Not on file  . Emotionally Abused: Not on file  . Physically Abused: Not on file  .  Sexually Abused: Not on file    FAMILY HISTORY:  Family History  Problem Relation Age of Onset  . Colon cancer Mother 8       80's  . Breast cancer Sister   . Asthma Brother   . Colon polyps Neg Hx   . Esophageal cancer Neg Hx   . Rectal cancer Neg Hx   . Stomach cancer Neg Hx     CURRENT MEDICATIONS:  Current Outpatient Medications  Medication Sig Dispense Refill  . Copper Gluconate (COPPER CAPS PO) Take 1 capsule by mouth daily.     . cyanocobalamin ([employer] VITAMIN B12) 2000 MCG tablet Take 1 tablet (2,000 mcg total) by mouth daily.    . ergocalciferol (VITAMIN D2) 1.25 MG (50000 UT) capsule Take 1 capsule (50,000 Units total) by mouth once a week. 16 capsule 3  . ferrous sulfate 324 (65 Fe) MG TBEC Take 1 tablet (325 mg total) by mouth in the morning and at bedtime for 15 days. 90 tablet 0  . Magnesium 200 MG TABS Take 1 tablet (200 mg total) by mouth daily. 30 tablet 6  . Multiple Vitamin (MULTIVITAMIN) capsule Take 1 capsule by mouth daily.     . pantoprazole (PROTONIX) 40 MG tablet Take 1 tablet (40 mg total) by mouth daily. 30 tablet 0  . Sodium Sulfate-Mag Sulfate-KCl (SUTAB) [phone] MG TABS Take 1 kit by mouth as directed. MANUFACTURER CODES!! BIN: [id number] PCN: CN GROUP: [id number] MEMBER ID: [id number];IBB AS CASH;NO PRIOR AUTHORIZATION 24 tablet 0  . thiamine (VITAMIN B-1) 100 MG tablet Take 100 mg by mouth daily.     . vitamin E 400 UNIT capsule Take 400 Units by mouth daily.     Marland Kitchen acetaminophen (TYLENOL) 325 MG tablet Take 2 tablets (650 mg total) by mouth every 6 (six) hours as needed for mild pain (or Fever >/= 101). (Patient not taking: Reported on 12/14/2019) 12 tablet 0  . bisoprolol (ZEBETA) 5 MG tablet Take 0.5 tablets (2.5 mg total) by mouth daily. (Patient not taking: Reported on 11/30/2019) 45 tablet 3  . meclizine (ANTIVERT) 25 MG tablet Take 1 tablet (25 mg total) by mouth 3 (three) times daily as needed for dizziness. (Patient not taking: Reported on 11/30/2019)  30 tablet 1  . ondansetron (ZOFRAN) 4 MG tablet Take 1 tablet (4 mg total) by mouth every 6 (six) hours as needed for nausea. (Patient not taking: Reported on 11/30/2019) 20 tablet 0  . rosuvastatin (CRESTOR) 10 MG tablet TAKE 1 TABLET BY MOUTH AT  BEDTIME (Patient not taking: Reported on 11/30/2019) 90 tablet 3   No current facility-administered medications for this visit.    ALLERGIES:  Allergies  Allergen Reactions  . Sulfa Antibiotics Rash    PHYSICAL EXAM:  Performance status (  ECOG): 1 - Symptomatic but completely ambulatory  Vitals:   12/29/19 1400  BP: (!) 155/73  Pulse: 82  Resp: 17  Temp: (!) 96.8 F (36 C)  SpO2: 98%   Wt Readings from Last 3 Encounters:  12/29/19 200 lb 6.4 oz (90.9 kg)  12/21/19 197 lb (89.4 kg)  12/14/19 199 lb (90.3 kg)   Physical Exam Vitals reviewed.  Constitutional:      Appearance: Normal appearance. She is obese.  Cardiovascular:     Rate and Rhythm: Normal rate and regular rhythm.     Pulses: Normal pulses.     Heart sounds: Murmur (systolic murmur) heard.   Pulmonary:     Effort: Pulmonary effort is normal.     Breath sounds: Normal breath sounds.  Neurological:     General: No focal deficit present.     Mental Status: She is alert and oriented to person, place, and time.  Psychiatric:        Mood and Affect: Mood normal.        Behavior: Behavior normal.     LABORATORY DATA:  I have reviewed the labs as listed.  CBC Latest Ref Rng & Units 12/27/2019 12/12/2019 12/12/2019  WBC 4.0 - 10.5 K/uL 5.9 5.9 5.6  Hemoglobin 12.0 - 15.0 g/dL 10.0(L) 8.2(L) 6.5(LL)  Hematocrit 36 - 46 % 31.9(L) 25.8(L) 21.5(L)  Platelets 150 - 400 K/uL 274 181 206   CMP Latest Ref Rng & Units 12/27/2019 12/12/2019 11/24/2019  Glucose 70 - 99 mg/dL 105(H) 112(H) 98  BUN 8 - 23 mg/dL _0 Creatinine 0.44 - 1.00 mg/dL 0.61 0.50 0.65  Sodium 135 - 145 mmol/L 135 136 137  Potassium 3.5 - 5.1 mmol/L 3.7 3.7 3.8  Chloride 98 - 111 mmol/L 101 106 104  CO2  22 - 32 mmol/L _1 Calcium 8.9 - 10.3 mg/dL 8.9 8.4(L) 8.4(L)  Total Protein 6.5 - 8.1 g/dL 5.9(L) 5.8(L) -  Total Bilirubin 0.3 - 1.2 mg/dL 0.5 0.5 -  Alkaline Phos 38 - 126 U/L 36(L) 27(L) -  AST 15 - 41 U/L 26 23 -  ALT 0 - 44 U/L 18 15 -      Component Value Date/Time   RBC 3.57 (L) 12/27/2019 1354   MCV 89.4 12/27/2019 1354   MCV 90 11/30/2019 0918   MCH 28.0 12/27/2019 1354   MCHC 31.3 12/27/2019 1354   RDW 15.2 12/27/2019 1354   RDW 15.6 (H) 11/30/2019 0918   LYMPHSABS 1.9 12/27/2019 1354   LYMPHSABS 1.5 11/26/2019 1158   MONOABS 0.6 12/27/2019 1354   EOSABS 0.2 12/27/2019 1354   EOSABS 0.2 11/26/2019 1158   BASOSABS 0.1 12/27/2019 1354   BASOSABS 0.0 11/26/2019 1158   Lab Results  Component Value Date   VD25OH 56.63 12/27/2019   VD25OH 19.80 (L) 08/19/2019   VD25OH 26.7 (L) 03/23/2019   Lab Results  Component Value Date   LDH 164 12/27/2019   LDH 169 08/19/2019   LDH 192 04/19/2019   Lab Results  Component Value Date   TIBC 400 12/27/2019   TIBC 352 11/22/2019   TIBC 378 08/19/2019   FERRITIN 19 12/27/2019   FERRITIN 57 11/22/2019   FERRITIN 48 08/19/2019   IRONPCTSAT 11 12/27/2019   IRONPCTSAT 20 11/22/2019   IRONPCTSAT 26 08/19/2019    DIAGNOSTIC IMAGING:  I have independently reviewed the scans and discussed with the patient. DG Chest Portable 1 View  Result Date: 12/12/2019 CLINICAL DATA:  Shortness  of breath EXAM: PORTABLE CHEST 1 VIEW COMPARISON:  November 22, 2019, March 15, 2019 FINDINGS: The cardiomediastinal silhouette is unchanged in contour.Scattered RIGHT upper lobe predominant nodular opacities, better evaluated on prior CT. No pleural effusion. No pneumothorax. Linear LEFT basilar opacity consistent with atelectasis. Visualized abdomen is unremarkable. Multilevel degenerative changes of the thoracic spine. IMPRESSION: 1. LEFT basilar atelectasis. 2. Scattered RIGHT upper lobe predominant nodular opacities, better evaluated on prior  CT. Electronically Signed   By: Valentino Saxon MD   On: 12/12/2019 08:57     ASSESSMENT:  1.  Severe iron deficiency anemia: -Admitted on 03/24/2019 with hemoglobin 5.3 and ferritin of 6 and B12 of 145.  Status post 3 units PRBC and 1 infusion of Feraheme on 03/24/2019. -Colonoscopy on 11/17/2018 shows 2 diminutive polyps in the transverse colon, diverticulosis in the sigmoid colon and descending colon transverse colon. -EGD shows duodenal erosions. -Multiple stool occult blood was positive.  2.  B12 deficiency: -History of low B12 levels and on oral B12 therapy.   PLAN:  1.  Severe iron deficiency anemia: -Etiology of anemia is blood loss.  No improvement despite taking iron tablet twice daily. -She reports tarry stools. -Reviewed labs from 12/27/2019.  Ferritin decreased to 19 and hemoglobin dropped to 10 from 11.6.  She wants to feel well as she is going on a trip in the next couple of weeks. -She complains of severe fatigue.  Recommend Feraheme weekly x2. -As she is actively bleeding, she requires close follow-ups with repeat labs. -RTC on 01/24/2020 with labs.  2.  B12 deficiency: -She is continuing B12 tablet daily.  B12 level was normal.   Orders placed this encounter:  No orders of the defined types were placed in this encounter.    Derek Jack, MD [place] [phone]   I, Milinda Antis, am acting as a scribe for Dr. Sanda Linger.  I, Derek Jack MD, have reviewed the above documentation for accuracy and completeness, and I agree with the above.

## 2019-12-29 NOTE — Patient Instructions (Signed)
[place] at [facility] Discharge Instructions  You were seen today by Dr. Delton Coombes. He went over your recent results. You will be scheduled for 2 iron infusions with 1 week apart. Dr. Delton Coombes will see you back on November 1 for labs and follow up.   Thank you for choosing [place] at [facility] to provide your oncology and hematology care.  To afford each patient quality time with our provider, please arrive at least 15 minutes before your scheduled appointment time.   If you have a lab appointment with the [facility] please come in thru the Main Entrance and check in at the main information desk  You need to re-schedule your appointment should you arrive 10 or more minutes late.  We strive to give you quality time with our providers, and arriving late affects you and other patients whose appointments are after yours.  Also, if you no show three or more times for appointments you may be dismissed from the clinic at the providers discretion.     Again, thank you for choosing [facility]  Our hope is that these requests will decrease the amount of time that you wait before being seen by our physicians.       _____________________________________________________________  Should you have questions after your visit to [facility], please contact our office at [phone] between the hours of 8:00 a.m. and 4:30 p.m.  Voicemails left after 4:00 p.m. will not be returned until the following business day.  For prescription refill requests, have your pharmacy contact our office and allow 72 hours.    Cancer Center Support Programs:   > Cancer Support Group  2nd Tuesday of the month 1pm-2pm, Journey Room

## 2019-12-29 NOTE — Telephone Encounter (Signed)
Should patient keep taking her iron ?

## 2019-12-30 ENCOUNTER — Inpatient Hospital Stay (HOSPITAL_COMMUNITY): Payer: Medicare Other

## 2019-12-30 ENCOUNTER — Encounter (HOSPITAL_COMMUNITY): Payer: Self-pay

## 2019-12-30 VITALS — BP 131/66 | HR 60 | Temp 97.3°F | Resp 18

## 2019-12-30 DIAGNOSIS — E538 Deficiency of other specified B group vitamins: Secondary | ICD-10-CM | POA: Diagnosis not present

## 2019-12-30 DIAGNOSIS — D509 Iron deficiency anemia, unspecified: Secondary | ICD-10-CM | POA: Diagnosis not present

## 2019-12-30 DIAGNOSIS — D649 Anemia, unspecified: Secondary | ICD-10-CM

## 2019-12-30 MED ORDER — SODIUM CHLORIDE 0.9 % IV SOLN
510.0000 mg | Freq: Once | INTRAVENOUS | Status: AC
  Administered 2019-12-30: 510 mg via INTRAVENOUS
  Filled 2019-12-30: qty 510

## 2019-12-30 MED ORDER — SODIUM CHLORIDE 0.9 % IV SOLN: INTRAVENOUS | Status: DC

## 2019-12-30 NOTE — Progress Notes (Signed)
Iron infusion given per orders. Patient tolerated it well without problems. Vitals stable and discharged home from clinic ambulatory in stable condition. Follow up as scheduled.  

## 2020-01-07 ENCOUNTER — Encounter (HOSPITAL_COMMUNITY): Payer: Self-pay

## 2020-01-07 ENCOUNTER — Other Ambulatory Visit: Payer: Self-pay

## 2020-01-07 ENCOUNTER — Inpatient Hospital Stay (HOSPITAL_COMMUNITY): Payer: Medicare Other

## 2020-01-07 VITALS — BP 157/82 | HR 80 | Temp 97.2°F | Resp 18

## 2020-01-07 DIAGNOSIS — D649 Anemia, unspecified: Secondary | ICD-10-CM

## 2020-01-07 DIAGNOSIS — D509 Iron deficiency anemia, unspecified: Secondary | ICD-10-CM | POA: Diagnosis not present

## 2020-01-07 DIAGNOSIS — E538 Deficiency of other specified B group vitamins: Secondary | ICD-10-CM | POA: Diagnosis not present

## 2020-01-07 MED ORDER — SODIUM CHLORIDE 0.9 % IV SOLN
510.0000 mg | Freq: Once | INTRAVENOUS | Status: AC
  Administered 2020-01-07: 510 mg via INTRAVENOUS
  Filled 2020-01-07: qty 510

## 2020-01-07 MED ORDER — SODIUM CHLORIDE 0.9 % IV SOLN: INTRAVENOUS | Status: DC

## 2020-01-07 NOTE — Progress Notes (Signed)
Tolerated infusion w/o adverse reaction.  Alert, in no distress.  VSS.  Discharged ambulatory in stable condition.  

## 2020-01-10 ENCOUNTER — Other Ambulatory Visit (HOSPITAL_COMMUNITY): Payer: BC Managed Care – PPO

## 2020-01-12 ENCOUNTER — Encounter: Payer: Self-pay | Admitting: Internal Medicine

## 2020-01-12 ENCOUNTER — Ambulatory Visit (HOSPITAL_COMMUNITY): Payer: BC Managed Care – PPO | Admitting: Nurse Practitioner

## 2020-01-17 DIAGNOSIS — I422 Other hypertrophic cardiomyopathy: Secondary | ICD-10-CM | POA: Insufficient documentation

## 2020-01-17 NOTE — Progress Notes (Signed)
Cardiology Office Note   Date:  01/18/2020   ID:  Stacey Reyes, Stacey Reyes [birth year], MRN [id number] PCP:  [name], DO  Cardiologist:   Minus Breeding, MD   Chief Complaint  Patient presents with   Cardiomyopathy      History of Present Illness: Stacey Reyes is a 74 y.o. female who is referred by [name], DO for evaluation of a heart murmur .  She saw Dr. Pernell Dupre for evaluation of palpitations.  She was seen in 2017 with SVT or afib suspected but not detected on event monitor.   Echo EF was normal in 2017.  I saw her with a murmur that was thought to be more prominent.   Echo EF was 75% with severe LVH.   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  She was admitted to the hospital after her last visit here.  She was found to have profound anemia. She was found to have severe B12 deficiency.  She required transfusions.  Of note I did send her for a PYP scan.  I had wanted to get an MRI but cost was prohibitive at that time.  She subsequently had this and was found to have asymmetric septal hypertrophy.  There was no evidence of amyloid.   Since I last saw her she was admitted with GI blood loss with capsule endoscopy indicating possible lymphangiectasias .    She is not having any of the dyspnea that she was having.  She feels much better as long as she has transfusions or iron.  She only gets short of breath it turns out when she is anemic.  She is not having any chest pressure, neck or arm discomfort.  She is not having any palpitations, presyncope or syncope.  She is following with hematology    Past Medical History:  Diagnosis Date   Anemia    Cataract    removed bilateral   Chronic cystitis    Diverticulitis    Heart murmur    Hyperlipidemia    Hypertension    Personal history of colonic polyps-adenomas 01/07/2012   2009 - 2 diminutive adenomas (prior polyps also) 01/07/2012 - 2 diminutive adenomas      Past Surgical History:  Procedure Laterality  Date   BREAST BIOPSY Right    No Scar seen    COLONOSCOPY  multiple   ESOPHAGOGASTRODUODENOSCOPY (EGD) WITH PROPOFOL N/A 11/23/2019   Procedure: ESOPHAGOGASTRODUODENOSCOPY (EGD) WITH PROPOFOL;  Surgeon: Daneil Dolin, MD;  [facility];  Service: Endoscopy;  Laterality: N/A;   GIVENS CAPSULE STUDY N/A 11/23/2019   Procedure: GIVENS CAPSULE STUDY;  Surgeon: Daneil Dolin, MD;  [facility];  Service: Endoscopy;  Laterality: N/A;     Current Outpatient Medications  Medication Sig Dispense Refill   acetaminophen (TYLENOL) 325 MG tablet Take 2 tablets (650 mg total) by mouth every 6 (six) hours as needed for mild pain (or Fever >/= 101). 12 tablet 0   Copper Gluconate (COPPER CAPS PO) Take 1 capsule by mouth daily.      cyanocobalamin ([employer]S VITAMIN B12) 2000 MCG tablet Take 1 tablet (2,000 mcg total) by mouth daily.     ergocalciferol (VITAMIN D2) 1.25 MG (50000 UT) capsule Take 1 capsule (50,000 Units total) by mouth once a week. 16 capsule 3   Magnesium 200 MG TABS Take 1 tablet (200 mg total) by mouth daily. 30 tablet 6   Multiple Vitamin (MULTIVITAMIN) capsule Take 1 capsule by  mouth daily.      Sodium Sulfate-Mag Sulfate-KCl (SUTAB) [phone] MG TABS Take 1 kit by mouth as directed. MANUFACTURER CODES!! BIN: [id number] PCN: CN GROUP: [id number] MEMBER ID: [id number];SFK AS CASH;NO PRIOR AUTHORIZATION 24 tablet 0   thiamine (VITAMIN B-1) 100 MG tablet Take 100 mg by mouth daily.      vitamin E 400 UNIT capsule Take 400 Units by mouth daily.      ferrous sulfate 324 (65 Fe) MG TBEC Take 1 tablet (325 mg total) by mouth in the morning and at bedtime for 15 days. 90 tablet 0   pantoprazole (PROTONIX) 40 MG tablet Take 1 tablet (40 mg total) by mouth daily. 30 tablet 0   No current facility-administered medications for this visit.    Allergies:   Sulfa antibiotics    ROS:  Please see the history of present illness.   Otherwise, review of systems are  positive for none.   All other systems are reviewed and negative.    PHYSICAL EXAM: VS:  BP 115/78    Pulse 89    Temp (!) 96.3 F (35.7 C)    Ht 5' (1.524 m)    Wt 196 lb 12.8 oz (89.3 kg)    SpO2 98%    BMI 38.43 kg/m  , BMI Body mass index is 38.43 kg/m. GENERAL:  Well appearing NECK:  No jugular venous distention, waveform within normal limits, carotid upstroke brisk and symmetric, no bruits, no thyromegaly LUNGS:  Clear to auscultation bilaterally CHEST:  Unremarkable HEART:  PMI not displaced or sustained,S1 and S2 within normal limits, no S3, no S4, no clicks, no rubs, 3 out of 6 apical systolic murmur slightly increasing with the strain phase of Valsalva murmurs, no diastolic ABD:  Flat, positive bowel sounds normal in frequency in pitch, no bruits, no rebound, no guarding, no midline pulsatile mass, no hepatomegaly, no splenomegaly EXT:  2 plus pulses throughout, no edema, no cyanosis no clubbing  EKG:  EKG is not ordered today.    Recent Labs: 03/23/2019: TSH 1.020 11/22/2019: B Natriuretic Peptide 239.0 11/23/2019: Magnesium 2.1 12/27/2019: ALT 18; BUN 10; Creatinine, Ser 0.61; Hemoglobin 10.0; Platelets 274; Potassium 3.7; Sodium 135    Lipid Panel    Component Value Date/Time   CHOL 185 12/05/2011 0000   TRIG 187 (A) 12/05/2011 0000   HDL 44 12/05/2011 0000   LDLCALC 110 12/05/2011 0000      Wt Readings from Last 3 Encounters:  01/18/20 196 lb 12.8 oz (89.3 kg)  12/29/19 200 lb 6.4 oz (90.9 kg)  12/21/19 197 lb (89.4 kg)      Other studies Reviewed: Additional studies/ records that were reviewed today include:   ED notes and admission note.  Review of the above records demonstrates:    Please see elsewhere in the note.     ASSESSMENT AND PLAN:   Dyspnea:     This is related more to anemia but it is to her hypertrophic cardiomyopathy.  We did however discuss salt restriction.  No further work-up.   LVH/HCM:    She has asymmetric septal hypertrophy.   She  does not have any high risk features for SCD however, I do not see an indication for ICD.   I will send her to see Dr. Broadus John in consultation.  Hypertension:  Her blood pressure is actually running low and she has been taken off of her blood pressure medicines.  No change in therapy.    Current medicines are reviewed  at length with the patient today.  The patient does not have concerns regarding medicines.  The following changes have been made:  None   Labs/ tests ordered today include: None   Orders Placed This Encounter  Procedures   Ambulatory referral to Genetics     Disposition:   FU with me in 12 months.    Signed, Minus Breeding, MD  01/18/2020 2:13 PM    [facility]

## 2020-01-18 ENCOUNTER — Encounter: Payer: Self-pay | Admitting: Cardiology

## 2020-01-18 ENCOUNTER — Ambulatory Visit: Payer: Medicare Other | Admitting: Cardiology

## 2020-01-18 ENCOUNTER — Other Ambulatory Visit: Payer: Self-pay

## 2020-01-18 VITALS — BP 115/78 | HR 89 | Temp 96.3°F | Ht 60.0 in | Wt 196.8 lb

## 2020-01-18 DIAGNOSIS — R0602 Shortness of breath: Secondary | ICD-10-CM

## 2020-01-18 DIAGNOSIS — I10 Essential (primary) hypertension: Secondary | ICD-10-CM

## 2020-01-18 DIAGNOSIS — I422 Other hypertrophic cardiomyopathy: Secondary | ICD-10-CM

## 2020-01-18 NOTE — Patient Instructions (Addendum)
Medication Instructions:  None *If you need a refill on your cardiac medications before your next appointment, please call your pharmacy*   Lab Work: None If you have labs (blood work) drawn today and your tests are completely normal, you will receive your results only by: Marland Kitchen MyChart Message (if you have MyChart) OR . A paper copy in the mail If you have any lab test that is abnormal or we need to change your treatment, we will call you to review the results.   Testing/Procedures: None   Follow-Up: At [facility], you and your health needs are our priority.  As part of our continuing mission to provide you with exceptional heart care, we have created designated Provider Care Teams.  These Care Teams include your primary Cardiologist (physician) and Advanced Practice Providers (APPs -  Physician Assistants and Nurse Practitioners) who all work together to provide you with the care you need, when you need it.  We recommend signing up for the patient portal called "MyChart".  Sign up information is provided on this After Visit Summary.  MyChart is used to connect with patients for Virtual Visits (Telemedicine).  Patients are able to view lab/test results, encounter notes, upcoming appointments, etc.  Non-urgent messages can be sent to your provider as well.   To learn more about what you can do with MyChart, go to [email or web address].    Your next appointment:   12 month(s)  The format for your next appointment:   In Person  Provider:   Minus Breeding, MD   Other Instructions You have been referred to Dr. Lattie Corns. You will be contacted by her office. If you do not hear anything by Wednesday of next week, reach out to her office.

## 2020-01-19 ENCOUNTER — Inpatient Hospital Stay (HOSPITAL_COMMUNITY): Payer: Medicare Other

## 2020-01-19 DIAGNOSIS — E538 Deficiency of other specified B group vitamins: Secondary | ICD-10-CM | POA: Diagnosis not present

## 2020-01-19 DIAGNOSIS — D509 Iron deficiency anemia, unspecified: Secondary | ICD-10-CM | POA: Diagnosis not present

## 2020-01-19 LAB — CBC WITH DIFFERENTIAL/PLATELET
Abs Immature Granulocytes: 0.01 10*3/uL (ref 0.00–0.07)
Basophils Absolute: 0.1 10*3/uL (ref 0.0–0.1)
Basophils Relative: 1 %
Eosinophils Absolute: 0.3 10*3/uL (ref 0.0–0.5)
Eosinophils Relative: 4 %
HCT: 39.9 % (ref 36.0–46.0)
Hemoglobin: 12.7 g/dL (ref 12.0–15.0)
Immature Granulocytes: 0 %
Lymphocytes Relative: 23 %
Lymphs Abs: 1.6 10*3/uL (ref 0.7–4.0)
MCH: 28.7 pg (ref 26.0–34.0)
MCHC: 31.8 g/dL (ref 30.0–36.0)
MCV: 90.3 fL (ref 80.0–100.0)
Monocytes Absolute: 0.6 10*3/uL (ref 0.1–1.0)
Monocytes Relative: 8 %
Neutro Abs: 4.5 10*3/uL (ref 1.7–7.7)
Neutrophils Relative %: 64 %
Platelets: 235 10*3/uL (ref 150–400)
RBC: 4.42 MIL/uL (ref 3.87–5.11)
RDW: 15.9 % — ABNORMAL HIGH (ref 11.5–15.5)
WBC: 6.9 10*3/uL (ref 4.0–10.5)
nRBC: 0 % (ref 0.0–0.2)

## 2020-01-19 LAB — RETICULOCYTES
Immature Retic Fract: 7.8 % (ref 2.3–15.9)
RBC.: 4.37 MIL/uL (ref 3.87–5.11)
Retic Count, Absolute: 65.6 10*3/uL (ref 19.0–186.0)
Retic Ct Pct: 1.5 % (ref 0.4–3.1)

## 2020-01-19 LAB — FERRITIN: Ferritin: 250 ng/mL (ref 11–307)

## 2020-01-19 LAB — IRON AND TIBC
Iron: 38 ug/dL (ref 28–170)
Saturation Ratios: 11 % (ref 10.4–31.8)
TIBC: 343 ug/dL (ref 250–450)
UIBC: 305 ug/dL

## 2020-01-24 ENCOUNTER — Inpatient Hospital Stay (HOSPITAL_COMMUNITY): Payer: Medicare Other | Attending: Hematology | Admitting: Hematology

## 2020-01-24 ENCOUNTER — Other Ambulatory Visit: Payer: Self-pay

## 2020-01-24 VITALS — BP 158/81 | HR 78 | Temp 97.2°F | Resp 18 | Wt 200.6 lb

## 2020-01-24 DIAGNOSIS — I10 Essential (primary) hypertension: Secondary | ICD-10-CM | POA: Diagnosis not present

## 2020-01-24 DIAGNOSIS — E538 Deficiency of other specified B group vitamins: Secondary | ICD-10-CM | POA: Diagnosis not present

## 2020-01-24 DIAGNOSIS — D509 Iron deficiency anemia, unspecified: Secondary | ICD-10-CM | POA: Insufficient documentation

## 2020-01-24 DIAGNOSIS — Z87891 Personal history of nicotine dependence: Secondary | ICD-10-CM | POA: Diagnosis not present

## 2020-01-24 HISTORY — PX: COLONOSCOPY: SHX174

## 2020-01-24 NOTE — Progress Notes (Signed)
Stacey Reyes, Stacey Reyes [id number]   CLINIC:  Medical Oncology/Hematology  PCP:  Stacey Norlander, DO [address] / [place] [address]  [phone]  REASON FOR VISIT:  Follow-up for IDA  PRIOR THERAPY: None  CURRENT THERAPY: Intermittent Feraheme last on 01/07/2020  INTERVAL HISTORY:  [name], a 74 y.o. female, returns for routine follow-up for her IDA. Stacey Reyes was last seen on 12/29/2019.  Today she reports feeling well. She has felt well since she received Feraheme on 10/15 and reports that it took 1 day for her energy levels to improve. She is not on any blood thinners.  She will go on vacation on 11/13 for 2 weeks.   REVIEW OF SYSTEMS:  Review of Systems  Constitutional: Positive for fatigue (90%). Negative for appetite change.  All other systems reviewed and are negative.   PAST MEDICAL/SURGICAL HISTORY:  Past Medical History:  Diagnosis Date  . Anemia   . Cataract    removed bilateral  . Chronic cystitis   . Diverticulitis   . Heart murmur   . Hyperlipidemia   . Hypertension   . Personal history of colonic polyps-adenomas 01/07/2012   2009 - 2 diminutive adenomas (prior polyps also) 01/07/2012 - 2 diminutive adenomas     Past Surgical History:  Procedure Laterality Date  . BREAST BIOPSY Right    No Scar seen   . COLONOSCOPY  multiple  . ESOPHAGOGASTRODUODENOSCOPY (EGD) WITH PROPOFOL N/A 11/23/2019   Procedure: ESOPHAGOGASTRODUODENOSCOPY (EGD) WITH PROPOFOL;  Surgeon: Stacey Dolin, MD;  [facility]: [facility];  Service: Endoscopy;  Laterality: N/A;  . GIVENS CAPSULE STUDY N/A 11/23/2019   Procedure: GIVENS CAPSULE STUDY;  Surgeon: Stacey Dolin, MD;  Location: [facility];  Service: Endoscopy;  Laterality: N/A;    SOCIAL HISTORY:  Social History   Socioeconomic History  . Marital status: Married    Spouse name: Not on file  . Number of children: 1  . Years of education: Not on file  .  Highest education level: Not on file  Occupational History  . Occupation: retired  Tobacco Use  . Smoking status: Former Smoker    Quit date: 12/23/1984    Years since quitting: 35.1  . Smokeless tobacco: Never Used  Vaping Use  . Vaping Use: Never used  Substance and Sexual Activity  . Alcohol use: Yes    Comment: rare  . Drug use: No  . Sexual activity: Not on file  Other Topics Concern  . Not on file  Social History Narrative   Patient is married and retired and has 1 grown child   Social Determinants of Radio broadcast assistant Strain:   . Difficulty of Paying Living Expenses: Not on file  Food Insecurity:   . Worried About Charity fundraiser in the Last Year: Not on file  . Ran Out of Food in the Last Year: Not on file  Transportation Needs:   . Lack of Transportation (Medical): Not on file  . Lack of Transportation (Non-Medical): Not on file  Physical Activity:   . Days of Exercise per Week: Not on file  . Minutes of Exercise per Session: Not on file  Stress:   . Feeling of Stress : Not on file  Social Connections:   . Frequency of Communication with Friends and Family: Not on file  . Frequency of Social Gatherings with Friends and Family: Not on file  .  Attends Religious Services: Not on file  . Active Member of Clubs or Organizations: Not on file  . Attends Archivist Meetings: Not on file  . Marital Status: Not on file  Intimate Partner Violence:   . Fear of Current or Ex-Partner: Not on file  . Emotionally Abused: Not on file  . Physically Abused: Not on file  . Sexually Abused: Not on file    FAMILY HISTORY:  Family History  Problem Relation Age of Onset  . Colon cancer Mother 55       80's  . Breast cancer Sister   . Asthma Brother   . Colon polyps Neg Hx   . Esophageal cancer Neg Hx   . Rectal cancer Neg Hx   . Stomach cancer Neg Hx     CURRENT MEDICATIONS:  Current Outpatient Medications  Medication Sig Dispense Refill  .  Copper Gluconate (COPPER CAPS PO) Take 1 capsule by mouth daily.     . cyanocobalamin ([employer] VITAMIN B12) 2000 MCG tablet Take 1 tablet (2,000 mcg total) by mouth daily.    . ergocalciferol (VITAMIN D2) 1.25 MG (50000 UT) capsule Take 1 capsule (50,000 Units total) by mouth once a week. 16 capsule 3  . Magnesium 200 MG TABS Take 1 tablet (200 mg total) by mouth daily. 30 tablet 6  . Multiple Vitamin (MULTIVITAMIN) capsule Take 1 capsule by mouth daily.     . Sodium Sulfate-Mag Sulfate-KCl (SUTAB) [phone] MG TABS Take 1 kit by mouth as directed. MANUFACTURER CODES!! BIN: [id number] PCN: CN GROUP: [id number] MEMBER ID: [id number];UXL AS CASH;NO PRIOR AUTHORIZATION 24 tablet 0  . thiamine (VITAMIN B-1) 100 MG tablet Take 100 mg by mouth daily.     . vitamin E 400 UNIT capsule Take 400 Units by mouth daily.     Marland Kitchen acetaminophen (TYLENOL) 325 MG tablet Take 2 tablets (650 mg total) by mouth every 6 (six) hours as needed for mild pain (or Fever >/= 101). (Patient not taking: Reported on 01/24/2020) 12 tablet 0  . ferrous sulfate 324 (65 Fe) MG TBEC Take 1 tablet (325 mg total) by mouth in the morning and at bedtime for 15 days. 90 tablet 0  . pantoprazole (PROTONIX) 40 MG tablet Take 1 tablet (40 mg total) by mouth daily. 30 tablet 0   No current facility-administered medications for this visit.    ALLERGIES:  Allergies  Allergen Reactions  . Sulfa Antibiotics Rash    PHYSICAL EXAM:  Performance status (ECOG): 1 - Symptomatic but completely ambulatory  Vitals:   01/24/20 1423  BP: (!) 158/81  Pulse: 78  Resp: 18  Temp: (!) 97.2 F (36.2 C)  SpO2: 98%   Wt Readings from Last 3 Encounters:  01/24/20 200 lb 9.6 oz (91 kg)  01/18/20 196 lb 12.8 oz (89.3 kg)  12/29/19 200 lb 6.4 oz (90.9 kg)   Physical Exam Vitals reviewed.  Constitutional:      Appearance: Normal appearance. She is obese.  Neurological:     General: No focal deficit present.     Mental Status: She is alert and oriented  to person, place, and time.  Psychiatric:        Mood and Affect: Mood normal.        Behavior: Behavior normal.     LABORATORY DATA:  I have reviewed the labs as listed.  CBC Latest Ref Rng & Units 01/19/2020 12/27/2019 12/12/2019  WBC 4.0 - 10.5 K/uL 6.9 5.9 5.9  Hemoglobin 12.0 -  15.0 g/dL 12.7 10.0(L) 8.2(L)  Hematocrit 36 - 46 % 39.9 31.9(L) 25.8(L)  Platelets 150 - 400 K/uL 235 274 181   CMP Latest Ref Rng & Units 12/27/2019 12/12/2019 11/24/2019  Glucose 70 - 99 mg/dL 105(H) 112(H) 98  BUN 8 - 23 mg/dL $Remove'10 11 8  'HCJGLgm$ Creatinine 0.44 - 1.00 mg/dL 0.61 0.50 0.65  Sodium 135 - 145 mmol/L 135 136 137  Potassium 3.5 - 5.1 mmol/L 3.7 3.7 3.8  Chloride 98 - 111 mmol/L 101 106 104  CO2 22 - 32 mmol/L $RemoveB'25 23 25  'cDavxPlI$ Calcium 8.9 - 10.3 mg/dL 8.9 8.4(L) 8.4(L)  Total Protein 6.5 - 8.1 g/dL 5.9(L) 5.8(L) -  Total Bilirubin 0.3 - 1.2 mg/dL 0.5 0.5 -  Alkaline Phos 38 - 126 U/L 36(L) 27(L) -  AST 15 - 41 U/L 26 23 -  ALT 0 - 44 U/L 18 15 -      Component Value Date/Time   RBC 4.42 01/19/2020 1319   RBC 4.37 01/19/2020 1319   MCV 90.3 01/19/2020 1319   MCV 90 11/30/2019 0918   MCH 28.7 01/19/2020 1319   MCHC 31.8 01/19/2020 1319   RDW 15.9 (H) 01/19/2020 1319   RDW 15.6 (H) 11/30/2019 0918   LYMPHSABS 1.6 01/19/2020 1319   LYMPHSABS 1.5 11/26/2019 1158   MONOABS 0.6 01/19/2020 1319   EOSABS 0.3 01/19/2020 1319   EOSABS 0.2 11/26/2019 1158   BASOSABS 0.1 01/19/2020 1319   BASOSABS 0.0 11/26/2019 1158   Lab Results  Component Value Date   TIBC 343 01/19/2020   TIBC 400 12/27/2019   TIBC 352 11/22/2019   FERRITIN 250 01/19/2020   FERRITIN 19 12/27/2019   FERRITIN 57 11/22/2019   IRONPCTSAT 11 01/19/2020   IRONPCTSAT 11 12/27/2019   IRONPCTSAT 20 11/22/2019    DIAGNOSTIC IMAGING:  I have independently reviewed the scans and discussed with the patient. No results found.   ASSESSMENT:  1. Severe iron deficiency anemia: -Admitted on 03/24/2019 with hemoglobin 5.3 and ferritin of 6  and B12 of 145. Status post 3 units PRBC and 1 infusion of Feraheme on 03/24/2019. -Colonoscopy on 11/17/2018 shows 2 diminutive polyps in the transverse colon, diverticulosis in the sigmoid colon and descending colon transverse colon. -EGD shows duodenal erosions. -Multiple stool occult blood was positive. -Etiology of anemia is blood loss.  No improvement despite taking iron tablet twice daily. -Feraheme on 12/30/2019 and 01/06/2020.  2. B12 deficiency: -History of low B12 levels and on oral B12 therapy.   PLAN:  1. Severe iron deficiency anemia: -Etiology of anemia is blood loss.  No improvement despite taking iron tablet twice daily. -She received 2 infusions of Feraheme, last infusion 2 weeks ago.  She felt better within 1 to 2 days after receiving infusion. -Reviewed labs from 01/19/2020 with hemoglobin 12.7 from 10.  Ferritin improved to 250 from 19. -She is going on a trip next week.  We will evaluate her in 2 months with repeat labs.  She was told to call us and come back sooner should she develop any severe weakness.  2. B12 deficiency: -Continue B12 tablet daily.  B12 level was normal.  Orders placed this encounter:  No orders of the defined types were placed in this encounter.    Derek Jack, MD [place] [phone]   I, Milinda Antis, am acting as a scribe for Dr. Sanda Linger.  I, Derek Jack MD, have reviewed the above documentation for accuracy and completeness, and I agree with  the above.

## 2020-01-24 NOTE — Patient Instructions (Signed)
[facility] Discharge Instructions  You were seen today by Dr. Katragadda. He went over your recent results. Dr. Katragadda will see you back in 2 months for labs and follow up.   Thank you for choosing [facility] at [facility] to provide your oncology and hematology care.  To afford each patient quality time with our provider, please arrive at least 15 minutes before your scheduled appointment time.   If you have a lab appointment with the Cancer Center please come in thru the Main Entrance and check in at the main information desk  You need to re-schedule your appointment should you arrive 10 or more minutes late.  We strive to give you quality time with our providers, and arriving late affects you and other patients whose appointments are after yours.  Also, if you no show three or more times for appointments you may be dismissed from the clinic at the providers discretion.     Again, thank you for choosing [facility].  Our hope is that these requests will decrease the amount of time that you wait before being seen by our physicians.       _____________________________________________________________  Should you have questions after your visit to [facility], please contact our office at [phone] between the hours of 8:00 a.m. and 4:30 p.m.  Voicemails left after 4:00 p.m. will not be returned until the following business day.  For prescription refill requests, have your pharmacy contact our office and allow 72 hours.    Cancer Center Support Programs:   > Cancer Support Group  2nd Tuesday of the month 1pm-2pm, Journey Room   

## 2020-01-25 ENCOUNTER — Encounter: Payer: Self-pay | Admitting: Internal Medicine

## 2020-01-25 ENCOUNTER — Ambulatory Visit (AMBULATORY_SURGERY_CENTER): Payer: Medicare Other | Admitting: Internal Medicine

## 2020-01-25 ENCOUNTER — Telehealth: Payer: Self-pay

## 2020-01-25 VITALS — BP 152/66 | HR 54 | Temp 97.1°F | Resp 17 | Ht 61.0 in | Wt 196.0 lb

## 2020-01-25 DIAGNOSIS — K921 Melena: Secondary | ICD-10-CM | POA: Diagnosis not present

## 2020-01-25 DIAGNOSIS — D123 Benign neoplasm of transverse colon: Secondary | ICD-10-CM

## 2020-01-25 DIAGNOSIS — D500 Iron deficiency anemia secondary to blood loss (chronic): Secondary | ICD-10-CM

## 2020-01-25 DIAGNOSIS — K649 Unspecified hemorrhoids: Secondary | ICD-10-CM | POA: Diagnosis not present

## 2020-01-25 DIAGNOSIS — R195 Other fecal abnormalities: Secondary | ICD-10-CM

## 2020-01-25 DIAGNOSIS — D122 Benign neoplasm of ascending colon: Secondary | ICD-10-CM

## 2020-01-25 DIAGNOSIS — K5730 Diverticulosis of large intestine without perforation or abscess without bleeding: Secondary | ICD-10-CM

## 2020-01-25 DIAGNOSIS — K639 Disease of intestine, unspecified: Secondary | ICD-10-CM

## 2020-01-25 DIAGNOSIS — K2980 Duodenitis without bleeding: Secondary | ICD-10-CM

## 2020-01-25 MED ORDER — SODIUM CHLORIDE 0.9 % IV SOLN: 500.0000 mL | Freq: Once | INTRAVENOUS | Status: DC

## 2020-01-25 NOTE — Patient Instructions (Signed)
Handouts on polyps, hemorrhoids, and diverticulosis given to you today  Await pathology results    YOU HAD AN ENDOSCOPIC PROCEDURE TODAY AT [facility]:   Refer to the procedure report that was given to you for any specific questions about what was found during the examination.  If the procedure report does not answer your questions, please call your gastroenterologist to clarify.  If you requested that your care partner not be given the details of your procedure findings, then the procedure report has been included in a sealed envelope for you to review at your convenience later.  YOU SHOULD EXPECT: Some feelings of bloating in the abdomen. Passage of more gas than usual.  Walking can help get rid of the air that was put into your GI tract during the procedure and reduce the bloating. If you had a lower endoscopy (such as a colonoscopy or flexible sigmoidoscopy) you may notice spotting of blood in your stool or on the toilet paper. If you underwent a bowel prep for your procedure, you may not have a normal bowel movement for a few days.  Please Note:  You might notice some irritation and congestion in your nose or some drainage.  This is from the oxygen used during your procedure.  There is no need for concern and it should clear up in a day or so.  SYMPTOMS TO REPORT IMMEDIATELY:   Following lower endoscopy (colonoscopy or flexible sigmoidoscopy):  Excessive amounts of blood in the stool  Significant tenderness or worsening of abdominal pains  Swelling of the abdomen that is new, acute  Fever of 100F or higher  For urgent or emergent issues, a gastroenterologist can be reached at any hour by calling [phone]. Do not use MyChart messaging for urgent concerns.    DIET:  We do recommend a small meal at first, but then you may proceed to your regular diet.  Drink plenty of fluids but you should avoid alcoholic beverages for 24 hours.  ACTIVITY:  You should plan to take  it easy for the rest of today and you should NOT DRIVE or use heavy machinery until tomorrow (because of the sedation medicines used during the test).    FOLLOW UP: Our staff will call the number listed on your records 48-72 hours following your procedure to check on you and address any questions or concerns that you may have regarding the information given to you following your procedure. If we do not reach you, we will leave a message.  We will attempt to reach you two times.  During this call, we will ask if you have developed any symptoms of COVID 19. If you develop any symptoms (ie: fever, flu-like symptoms, shortness of breath, cough etc.) before then, please call [phone].  If you test positive for Covid 19 in the 2 weeks post procedure, please call and report this information to [place].    If any biopsies were taken you will be contacted by phone or by letter within the next 1-3 weeks.  Please call us at [phone] if you have not heard about the biopsies in 3 weeks.    SIGNATURES/CONFIDENTIALITY: You and/or your care partner have signed paperwork which will be entered into your electronic medical record.  These signatures attest to the fact that that the information above on your After Visit Summary has been reviewed and is understood.  Full responsibility of the confidentiality of this discharge information lies with you and/or your care-partner.

## 2020-01-25 NOTE — Op Note (Signed)
[facility] Patient Name: Miyana Mordecai Procedure Date: 01/25/2020 9:57 AM MRN: [id number] Endoscopist: Jerene Bears , MD Age: 74 Referring MD:  Date of Birth: [birth year] Gender: Female Account #: [id number] Procedure:                Colonoscopy Indications:              Iron deficiency anemia secondary to chronic blood                            loss, heme positive stool; last colonoscopy Aug                            2020, EGD Aug 2020 and Aug 2021 Medicines:                Monitored Anesthesia Care Procedure:                Pre-Anesthesia Assessment:                           - Prior to the procedure, a History and Physical                            was performed, and patient medications and                            allergies were reviewed. The patient's tolerance of                            previous anesthesia was also reviewed. The risks                            and benefits of the procedure and the sedation                            options and risks were discussed with the patient.                            All questions were answered, and informed consent                            was obtained. Prior Anticoagulants: The patient has                            taken no previous anticoagulant or antiplatelet                            agents. ASA Grade Assessment: II - A patient with                            mild systemic disease. After reviewing the risks                            and benefits, the patient was deemed in  satisfactory condition to undergo the procedure.                           After obtaining informed consent, the colonoscope                            was passed under direct vision. Throughout the                            procedure, the patient's blood pressure, pulse, and                            oxygen saturations were monitored continuously. The                            Colonoscope was introduced  through the anus and                            advanced to the terminal ileum. The colonoscopy was                            technically difficult and complex due to multiple                            diverticula in the colon and a tortuous colon.                            Successful completion of the procedure was aided by                            applying abdominal pressure. The patient tolerated                            the procedure well. The quality of the bowel                            preparation was good. The terminal ileum, ileocecal                            valve, appendiceal orifice, and rectum were                            photographed. Scope In: 10:05:00 AM Scope Out: 10:35:24 AM Scope Withdrawal Time: 0 hours 19 minutes 28 seconds  Total Procedure Duration: 0 hours 30 minutes 24 seconds  Findings:                 The digital rectal exam was normal.                           The terminal ileum appeared normal.                           Five sessile polyps were found in the ascending  colon. The polyps were 3 to 9 mm in size. These                            polyps were removed with a cold snare. Resection                            and retrieval were complete.                           Two sessile polyps were found in the transverse                            colon. The polyps were 4 to 5 mm in size. These                            polyps were removed with a cold snare. Resection                            and retrieval were complete.                           Multiple small and large-mouthed diverticula were                            found in the sigmoid colon, descending colon,                            transverse colon and ascending colon. Erythema and                            petechia were seen in association with the                            diverticular opening and in the segment with most                            severe  diverticulosis (sigmoid and descending).                            Biopsies were taken with a cold forceps for                            histology to exclude SCAD.                           External and internal hemorrhoids were found during                            retroflexion. The hemorrhoids were small.                           The exam was otherwise without abnormality. No  confirmed source for IDA. Complications:            No immediate complications. Estimated Blood Loss:     Estimated blood loss was minimal. Impression:               - The examined portion of the ileum was normal.                           - Five 3 to 9 mm polyps in the ascending colon,                            removed with a cold snare. Resected and retrieved.                           - Two 4 to 5 mm polyps in the transverse colon,                            removed with a cold snare. Resected and retrieved.                           - Mild diverticulosis in the sigmoid colon, in the                            descending colon, in the transverse colon and in                            the ascending colon. Erythema and petechia were                            visualized in association with the diverticulosis                            in the left colon. Biopsied.                           - External and internal hemorrhoids.                           - The examination was otherwise normal. Recommendation:           - Patient has a contact number available for                            emergencies. The signs and symptoms of potential                            delayed complications were discussed with the                            patient. Return to normal activities tomorrow.                            Written discharge instructions were provided to the  patient.                           - Resume previous diet.                           - Continue  present medications.                           - Await pathology results.                           - Repeat colonoscopy may be recommended. The                            colonoscopy date will be determined after pathology                            results from today's exam become available for                            review.                           - Proceed with outpatient video capsule endoscopy                            to complete evaluation of IDA. This can be                            performed in the outpatient office setting given                            our new [place] equipment. Jerene Bears, MD 01/25/2020 10:45:13 AM This report has been signed electronically.

## 2020-01-25 NOTE — Progress Notes (Signed)
History reviewed today  VS Stacey Reyes

## 2020-01-25 NOTE — Progress Notes (Signed)
Report given to PACU, vss 

## 2020-01-25 NOTE — Progress Notes (Signed)
Called to room to assist during endoscopic procedure.  Patient ID and intended procedure confirmed with present staff. Received instructions for my participation in the procedure from the performing physician.  

## 2020-01-25 NOTE — Telephone Encounter (Signed)
Pyrtle, Lajuan Lines, MD sent to Gatha Mayer, MD; Marlon Pel, RN Glendell Docker,  Colon done. No obvious source of IDA. I don't think the left colon has SCAD, but biopsied to exclude. Erythema there was mild and tics are severe in the left.  I will have her set up for VCE under you.  [place]all, Xia.Dress.  Thanks  JMP    Patient contacted to schedule capsule endo.  She is scheduled for 01/31/20 8:30.  I sent her instructions via MyChart.  She will let me know if she has any questions.

## 2020-01-26 ENCOUNTER — Other Ambulatory Visit: Payer: Self-pay | Admitting: Family Medicine

## 2020-01-26 MED ORDER — PANTOPRAZOLE SODIUM 40 MG PO TBEC: 40.0000 mg | DELAYED_RELEASE_TABLET | Freq: Every day | ORAL | 1 refills | Status: DC

## 2020-01-26 NOTE — Addendum Note (Signed)
Addended by: Ladean Raya on: 01/26/2020 08:03 AM   Modules accepted: Orders

## 2020-01-26 NOTE — Telephone Encounter (Signed)
Med sent. Please continue to follow up with GI for ongoing recommendations regarding that medication.

## 2020-01-26 NOTE — Telephone Encounter (Signed)
You did not order this medication are you ok with approving it?

## 2020-01-27 ENCOUNTER — Telehealth: Payer: Self-pay | Admitting: *Deleted

## 2020-01-27 ENCOUNTER — Telehealth: Payer: Self-pay

## 2020-01-27 NOTE — Telephone Encounter (Signed)
Pt is already scheduled for capsule endo on Monday 01/31/20@8 :30am. Pt aware of appt.

## 2020-01-27 NOTE — Telephone Encounter (Signed)
Left message for pt to call back, needs to be scheduled for capsule endo.

## 2020-01-27 NOTE — Telephone Encounter (Signed)
  Follow up Call-  Call back number 01/25/2020 11/17/2018  Post procedure Call Back phone  # [phone]  Permission to leave phone message Yes Yes  Some recent data might be hidden     Patient questions:  Do you have a fever, pain , or abdominal swelling? No. Pain Score  0 *  Have you tolerated food without any problems? Yes.    Have you been able to return to your normal activities? Yes.    Do you have any questions about your discharge instructions: Diet   No. Medications  No. Follow up visit  No.  Do you have questions or concerns about your Care? No.  Actions: * If pain score is 4 or above: No action needed, pain <4.  1. Have you developed a fever since your procedure? no  2.   Have you had an respiratory symptoms (SOB or cough) since your procedure? no  3.   Have you tested positive for COVID 19 since your procedure no  4.   Have you had any family members/close contacts diagnosed with the COVID 19 since your procedure? no   If yes to any of these questions please route to Joylene John, RN and Joella Prince, RN

## 2020-01-27 NOTE — Telephone Encounter (Signed)
Pt calling you back

## 2020-01-27 NOTE — Telephone Encounter (Signed)
First attempt, left VM.  

## 2020-01-31 ENCOUNTER — Ambulatory Visit (INDEPENDENT_AMBULATORY_CARE_PROVIDER_SITE_OTHER): Payer: Medicare Other | Admitting: Internal Medicine

## 2020-01-31 ENCOUNTER — Encounter: Payer: Self-pay | Admitting: Internal Medicine

## 2020-01-31 DIAGNOSIS — D500 Iron deficiency anemia secondary to blood loss (chronic): Secondary | ICD-10-CM | POA: Diagnosis not present

## 2020-01-31 DIAGNOSIS — K5520 Angiodysplasia of colon without hemorrhage: Secondary | ICD-10-CM

## 2020-01-31 NOTE — Patient Instructions (Signed)
You may have clear liquids beginning at 10:30 am after ingesting the capsule.    At 1:00 pm mix 17 GMs of Miralax in 8 oz of liquid,  of your choice,  and drink. You may have a light lunch of half a sandwich and a bowl of soup. You may have a normal dinner after 5:00 pm.  No further dietary restrictions are necessary.  You should pass the capsule in your stool 8-48 hours after ingestion. If you have not passed the capsule, after 72 hours, please contact the office at [phone].  Please return to our office at 4 pm to remove the equipment.  Call our office and ask for Oliviah Agostini if you have any concerns throughout today.

## 2020-01-31 NOTE — Progress Notes (Signed)
The pt arrived for capsule prepped and ready for capsule ingestion.  We discussed her diet and activity for the rest of the day. She did agree to return at 4 pm this afternoon to remove the equipment.  The pt verbalized understanding of all written and verbal instructions.  Capsule was ingested without incident.    LOT # [id number] Cap # [id number] EXP # 04-12-2021

## 2020-02-03 ENCOUNTER — Encounter: Payer: Self-pay | Admitting: Internal Medicine

## 2020-02-09 ENCOUNTER — Telehealth: Payer: Self-pay | Admitting: Internal Medicine

## 2020-02-09 DIAGNOSIS — T189XXA Foreign body of alimentary tract, part unspecified, initial encounter: Secondary | ICD-10-CM

## 2020-02-09 NOTE — Telephone Encounter (Signed)
Patient notified of results and recommendations.  She verbalized understating.  She is in [place], but doesn't believe she has passed the capsule.  She will be back in  early next week. She understands that if she does not see it pass by the time she returns she will come for a KUB.  She understands the orders are in and to come at her convenience when she returns.

## 2020-02-09 NOTE — Telephone Encounter (Signed)
Please tell the patient that her capsule endoscopy demonstrated 2 small AVMs which are sites where she could be leaking blood.  Many times there are more than we see on this exam as well.  While they can be ablated with the procedure at the hospital we cannot reach both of the ones that were seen so my recommendation is that she continue with her iron treatment from Dr. Delton Coombes and if that does not hold her then we can consider the hospital procedures understanding that they may only be partially effective at best.  She can schedule a follow-up with me if she wants to review this further.  Please copy the finished note to Dr. Delton Coombes of heme-onc in [place] as well.

## 2020-02-14 ENCOUNTER — Other Ambulatory Visit: Payer: Self-pay | Admitting: Family Medicine

## 2020-02-25 ENCOUNTER — Encounter (HOSPITAL_COMMUNITY): Payer: Self-pay

## 2020-02-25 ENCOUNTER — Encounter: Payer: Self-pay | Admitting: Family Medicine

## 2020-02-28 ENCOUNTER — Inpatient Hospital Stay (HOSPITAL_COMMUNITY): Payer: Medicare Other | Attending: Hematology

## 2020-02-28 ENCOUNTER — Other Ambulatory Visit (HOSPITAL_COMMUNITY): Payer: Self-pay

## 2020-02-28 ENCOUNTER — Other Ambulatory Visit: Payer: Self-pay

## 2020-02-28 DIAGNOSIS — D509 Iron deficiency anemia, unspecified: Secondary | ICD-10-CM

## 2020-02-28 LAB — CBC WITH DIFFERENTIAL/PLATELET
Abs Immature Granulocytes: 0.02 10*3/uL (ref 0.00–0.07)
Basophils Absolute: 0.1 10*3/uL (ref 0.0–0.1)
Basophils Relative: 1 %
Eosinophils Absolute: 0.3 10*3/uL (ref 0.0–0.5)
Eosinophils Relative: 5 %
HCT: 43.2 % (ref 36.0–46.0)
Hemoglobin: 14.3 g/dL (ref 12.0–15.0)
Immature Granulocytes: 0 %
Lymphocytes Relative: 27 %
Lymphs Abs: 2 10*3/uL (ref 0.7–4.0)
MCH: 28.8 pg (ref 26.0–34.0)
MCHC: 33.1 g/dL (ref 30.0–36.0)
MCV: 86.9 fL (ref 80.0–100.0)
Monocytes Absolute: 0.8 10*3/uL (ref 0.1–1.0)
Monocytes Relative: 11 %
Neutro Abs: 4.2 10*3/uL (ref 1.7–7.7)
Neutrophils Relative %: 56 %
Platelets: 236 10*3/uL (ref 150–400)
RBC: 4.97 MIL/uL (ref 3.87–5.11)
RDW: 15.8 % — ABNORMAL HIGH (ref 11.5–15.5)
WBC: 7.4 10*3/uL (ref 4.0–10.5)
nRBC: 0 % (ref 0.0–0.2)

## 2020-02-28 LAB — IRON AND TIBC
Iron: 92 ug/dL (ref 28–170)
Saturation Ratios: 27 % (ref 10.4–31.8)
TIBC: 346 ug/dL (ref 250–450)
UIBC: 254 ug/dL

## 2020-02-28 LAB — FERRITIN: Ferritin: 69 ng/mL (ref 11–307)

## 2020-03-02 ENCOUNTER — Other Ambulatory Visit: Payer: Self-pay

## 2020-03-02 ENCOUNTER — Other Ambulatory Visit: Payer: Self-pay | Admitting: Family Medicine

## 2020-03-02 ENCOUNTER — Ambulatory Visit: Payer: Medicare Other | Admitting: Genetic Counselor

## 2020-03-07 ENCOUNTER — Telehealth: Payer: Self-pay

## 2020-03-07 DIAGNOSIS — I422 Other hypertrophic cardiomyopathy: Secondary | ICD-10-CM

## 2020-03-07 NOTE — Telephone Encounter (Signed)
-----  Message from Minus Breeding, MD sent at 03/03/2020 12:35 PM EST ----- Regarding: FW: genetic test order Please order genetic testing :) ----- Message ----- From: Debbe Mounts, PhD Sent: 03/02/2020   5:04 PM EST To: Minus Breeding, MD Subject: genetic test order                             Clearnce Sorrel,  Just met with your patient and she would like to proceed with genetic testing. Can you please place an order in Epic for this.  Thanks [facility]

## 2020-03-08 ENCOUNTER — Ambulatory Visit (INDEPENDENT_AMBULATORY_CARE_PROVIDER_SITE_OTHER): Payer: Medicare Other | Admitting: Family Medicine

## 2020-03-08 ENCOUNTER — Other Ambulatory Visit: Payer: Self-pay

## 2020-03-08 ENCOUNTER — Encounter: Payer: Self-pay | Admitting: Family Medicine

## 2020-03-08 VITALS — BP 142/76 | HR 80 | Temp 98.0°F | Ht 61.0 in | Wt 195.0 lb

## 2020-03-08 DIAGNOSIS — R5383 Other fatigue: Secondary | ICD-10-CM

## 2020-03-08 DIAGNOSIS — I10 Essential (primary) hypertension: Secondary | ICD-10-CM

## 2020-03-08 DIAGNOSIS — R519 Headache, unspecified: Secondary | ICD-10-CM | POA: Diagnosis not present

## 2020-03-08 DIAGNOSIS — I422 Other hypertrophic cardiomyopathy: Secondary | ICD-10-CM | POA: Diagnosis not present

## 2020-03-08 DIAGNOSIS — D508 Other iron deficiency anemias: Secondary | ICD-10-CM

## 2020-03-08 MED ORDER — LISINOPRIL 5 MG PO TABS: 5.0000 mg | ORAL_TABLET | Freq: Every day | ORAL | 3 refills | Status: DC

## 2020-03-08 NOTE — Progress Notes (Signed)
Subjective: CC: Follow-up hypertension PCP: [name], DO MCN:[id number] Stacey Reyes is a 74 y.o. female presenting to clinic today for:  1.  Essential hypertension Patient reports that her blood pressures have been running in the 150s to 170s since she came off her lisinopril in September.  Apparently this was discontinued secondary to borderline hypotension in the setting of symptomatic anemia.  She is been having some morning headaches over the last couple of weeks.  Does not report any nausea, vomiting, chest pain, dizziness or other neurologic changes.  She started her lisinopril back 2 days ago and blood pressures are looking the best today than they have in a while now.  2.  Iron deficiency anemia Patient is managed by Dr. Delton Coombes for this.  She has required frequent infusions.  She had labs done in early December which showed normal iron, normal ferritin and normal TIBC.  Her hemoglobin was also normal.  She continues to have fatigue and wonders if she is having issues again.   ROS: Per HPI  Allergies  Allergen Reactions  . Sulfa Antibiotics Rash   Past Medical History:  Diagnosis Date  . Anemia   . Cataract    removed bilateral  . Chronic cystitis   . Diverticulitis   . Heart murmur   . Hyperlipidemia   . Hypertension   . Personal history of colonic polyps-adenomas 01/07/2012   2009 - 2 diminutive adenomas (prior polyps also) 01/07/2012 - 2 diminutive adenomas      Current Outpatient Medications:  .  acetaminophen (TYLENOL) 325 MG tablet, Take 2 tablets (650 mg total) by mouth every 6 (six) hours as needed for mild pain (or Fever >/= 101)., Disp: 12 tablet, Rfl: 0 .  Copper Gluconate (COPPER CAPS PO), Take 1 capsule by mouth daily. , Disp: , Rfl:  .  cyanocobalamin ([employer]S VITAMIN B12) 2000 MCG tablet, Take 1 tablet (2,000 mcg total) by mouth daily., Disp:  , Rfl:  .  ergocalciferol (VITAMIN D2) 1.25 MG (50000 UT) capsule, Take 1 capsule (50,000 Units total) by  mouth once a week., Disp: 16 capsule, Rfl: 3 .  lisinopril (ZESTRIL) 5 MG tablet, Take 5 mg by mouth daily., Disp: , Rfl:  .  Magnesium 200 MG TABS, Take 1 tablet (200 mg total) by mouth daily., Disp: 30 tablet, Rfl: 6 .  Multiple Vitamin (MULTIVITAMIN) capsule, Take 1 capsule by mouth daily. , Disp: , Rfl:  .  thiamine (VITAMIN B-1) 100 MG tablet, Take 100 mg by mouth daily. , Disp: , Rfl:  .  vitamin E 400 UNIT capsule, Take 400 Units by mouth daily. , Disp: , Rfl:  .  ferrous sulfate 324 (65 Fe) MG TBEC, Take 1 tablet (325 mg total) by mouth in the morning and at bedtime for 15 days. (Patient not taking: Reported on 01/25/2020), Disp: 90 tablet, Rfl: 0 .  pantoprazole (PROTONIX) 40 MG tablet, Take 1 tablet (40 mg total) by mouth daily., Disp: 90 tablet, Rfl: 1 Social History   Socioeconomic History  . Marital status: Married    Spouse name: Not on file  . Number of children: 1  . Years of education: Not on file  . Highest education level: Not on file  Occupational History  . Occupation: retired  Tobacco Use  . Smoking status: Former Smoker    Quit date: 12/23/1984    Years since quitting: 35.2  . Smokeless tobacco: Never Used  Vaping Use  . Vaping Use: Never used  Substance  and Sexual Activity  . Alcohol use: Yes    Comment: rare  . Drug use: No  . Sexual activity: Not on file  Other Topics Concern  . Not on file  Social History Narrative   Patient is married and retired and has 1 grown child   Social Determinants of Radio broadcast assistant Strain: Not on file  Food Insecurity: Not on file  Transportation Needs: Not on file  Physical Activity: Not on file  Stress: Not on file  Social Connections: Not on file  Intimate Partner Violence: Not on file   Family History  Problem Relation Age of Onset  . Colon cancer Mother 71       80's  . Breast cancer Sister   . Asthma Brother   . Colon polyps Neg Hx   . Esophageal cancer Neg Hx   . Rectal cancer Neg Hx   .  Stomach cancer Neg Hx     Objective: Office vital signs reviewed. There were no vitals taken for this visit.  Physical Examination:  General: Awake, alert, well nourished, No acute distress HEENT: Normal; no conjunctival pallor Cardio: regular rate and rhythm, S1S2 heard, 3/6 systolic murmurs appreciated Pulm: clear to auscultation bilaterally, no wheezes, rhonchi or rales; normal work of breathing on room air Extremities: warm, well perfused, No edema, cyanosis or clubbing; +2 pulses bilaterally Assessment/ Plan: 74 y.o. female   Other iron deficiency anemia - Plan: Hemoglobin, fingerstick  Hypertension, essential - Plan: lisinopril (ZESTRIL) 5 MG tablet  Morning headache  Hypertrophic cardiomyopathy (HCC)  Fatigue, unspecified type - Plan: TSH  Will check a fingerstick hemoglobin as needed.  This is placed as a standing order  I have little suspicion that she is having ongoing anemia at this point given normal labs just a week ago.  I am concerned about her morning headache and agree that it may be coming from blood pressure.  However, we also discussed differential of obstructive sleep apnea.  If she continues to have this headache despite control of blood pressure, we may need to consider proceeding with a polysomnogram  She was found to have hypertrophic cardiomyopathy.  She is managed by Dr. Percival Spanish for this.  No orders of the defined types were placed in this encounter.  No orders of the defined types were placed in this encounter.    [name], DO [place] [phone]

## 2020-03-08 NOTE — Patient Instructions (Signed)
Monitor BP daily for next 2 weeks.  Call me with readings or come in to have checked with nurse  Your doctor just got iron checked on 12/6 and it was normal I've ordered thyroid and a standing hemoglobin to keep an eye on anemia.

## 2020-03-09 LAB — TSH: TSH: 1.44 u[IU]/mL (ref 0.450–4.500)

## 2020-03-10 ENCOUNTER — Other Ambulatory Visit: Payer: Self-pay | Admitting: Adult Health

## 2020-03-10 DIAGNOSIS — I10 Essential (primary) hypertension: Secondary | ICD-10-CM

## 2020-03-11 NOTE — Progress Notes (Signed)
Pre Test GC  Referring Provider: Minus Breeding, MD   Referral Reason  Stacey Reyes was referred for genetic consult and testing of hypertrophic cardiomyopathy subsequent to a cardiac MRI that detected basal septal thickness of 2.5 cm and LV outflow tract obstruction.   Personal Medical Information Stacey Reyes (III.5 on pedigree) is an interesting 74 year-old Caucasian woman who was recently found to have cardiac wall thickness indictive of hypertrophic cardiomyopathy. She reports having symptoms of dyspnea upon exertion and fatigue for the last few years, heart murmur as a child but denies dizziness and syncope.    Traditional Risk Factors [name] was diagnosed with hypertension that she states is well controlled by medication.   Family history [name] (III.68) has a 26 year old son (IV.7) who is asymptomatic and has not yet been screened for HCM. He has two healthy sons with active lifestyles, ages 27 (V.1) and 102 (V.2). There is no history of heart disease amongst her six siblings- her three brothers are deceased (III.1, III.3, III.7) and sisters are living, ages 43 (III.2) and 38 (III.6).   [name]'s father (II.6) lived to 29 and her mother (II.7) lived to 46 with no prior heart issues. All her maternal and paternal relatives died of old age and lived to their 29s or 43s, except her maternal grandmother (I.4) who died very young from the [place] flu.  Genetics [name] was counseled on the genetics of hypertrophic cardiomyopathy (HCM). I explained to her that this is an autosomal dominant condition and hence her son has a 50% chance of inheriting HCM. Clinical screening involves echocardiogram and EKG at regular intervals, frequency is typically determined by age, with those over the age of 75 getting screened every 5 years. She verbalized understanding of this.   I discussed penetrance of HCM being incomplete i.e. not all individuals harboring a HCM mutation will present clinically  with HCM, and age-related penetrance where clinical presentation of HCM increases with advanced age. Based on her family history it seems highly likely that she has a de novo mutation.  I also reviewed variable expression of HCM and emphasized that this condition can express at any age at level of severity in the family informed her that some patients - about 8-10% can have compound and digenic mutations for HCM. Also briefly discussed the inheritance pattern and treatment /management plans for the infiltrative cardiomyopathies that present as HCM phenocopies.   We walked through the process of genetic testing. I explained to her that genetic testing is a probabilistic test dependent upon her age and severity of presentation, presence of risk factors for HCM and importantly family history of HCM or sudden death in first-degree relatives. She verbalized understanding of this.   The potential outcomes of genetic testing and subsequent management of at-risk family members were discussed so as to manage expectations-  I explained to her that if a mutation is not identified, then all first-degree relatives should regular screening for HCM. I emphasized that even if the genetic test is negative, it does not mean that she does not have HCM. A negative test result can be due to limitations of the genetic test. She verbalized understanding of this.  There is also the likelihood of identifying a "Variant of unknown significance". This result means that the variant has not been detected in a statistically significant number of HCM patients and functional studies have not been performed to verify its pathogenicity. This VUS can be tested in the family to see if it segregates  with disease. If a VUS is found, first-degree relatives should undergo regular clinical screening for HCM.  If a pathogenic variant is reported, then her first-degree family members, namely her son, can get tested for this variant. If he tests  positive, there is a high likelihood that they can develop HCM. In light of variable expression and incomplete penetrance associated with HCM, it is not possible to predict when he will manifest clinically with HCM. It is recommended that family members that test positive for the familial pathogenic variant pursue clinical screening for HCM. Family members that test negative for the familial mutation need not pursue periodic screening for HCM, but seek care if the symptoms develop.   Impression  Stacey Reyes is symptomatic and diagnosed with HCM at age 37. There is no significant family history of HCM or sudden death amongst her first-degree relatives. Her age of presentation and severity of presentation is indicative of a genetic condition. Genetic testing for HCM is recommended. This test should include the major genes for HCM as well as the HCM phenocopies of Fabry disease, Danon disease, Wolf-Parkinson White syndrome and FTA.   In addition, we discussed the protections afforded by the Genetic Information Non-Discrimination Act (GINA). I explained to her that GINA protects her from losing her employment or health insurance based on her genotype. However, these protections do not cover life insurance and disability. She verbalized understanding of this and is not sure if her son has life insurance  Please note that the patient has not been counseled in this visit on personal, cultural or ethical issues that she may face due to her heart condition.   Plan After a thorough discussion of the risk and benefits of genetic testing for HCM, [name] states her intent to pursue genetic testing for HCM. Blood was drawn today for testing  Lattie Corns, Ph.D, [facility] Clinical Molecular Geneticist

## 2020-03-27 ENCOUNTER — Inpatient Hospital Stay (HOSPITAL_COMMUNITY): Payer: Medicare Other

## 2020-03-28 ENCOUNTER — Inpatient Hospital Stay (HOSPITAL_COMMUNITY): Payer: Medicare Other | Attending: Hematology

## 2020-03-28 ENCOUNTER — Other Ambulatory Visit: Payer: Self-pay

## 2020-03-28 DIAGNOSIS — K5730 Diverticulosis of large intestine without perforation or abscess without bleeding: Secondary | ICD-10-CM | POA: Insufficient documentation

## 2020-03-28 DIAGNOSIS — D509 Iron deficiency anemia, unspecified: Secondary | ICD-10-CM

## 2020-03-28 DIAGNOSIS — Z87891 Personal history of nicotine dependence: Secondary | ICD-10-CM | POA: Insufficient documentation

## 2020-03-28 DIAGNOSIS — E538 Deficiency of other specified B group vitamins: Secondary | ICD-10-CM | POA: Diagnosis not present

## 2020-03-28 DIAGNOSIS — Z79899 Other long term (current) drug therapy: Secondary | ICD-10-CM | POA: Diagnosis not present

## 2020-03-28 LAB — CBC WITH DIFFERENTIAL/PLATELET
Abs Immature Granulocytes: 0.02 10*3/uL (ref 0.00–0.07)
Basophils Absolute: 0.1 10*3/uL (ref 0.0–0.1)
Basophils Relative: 1 %
Eosinophils Absolute: 0.3 10*3/uL (ref 0.0–0.5)
Eosinophils Relative: 3 %
HCT: 40.2 % (ref 36.0–46.0)
Hemoglobin: 13.4 g/dL (ref 12.0–15.0)
Immature Granulocytes: 0 %
Lymphocytes Relative: 25 %
Lymphs Abs: 2.2 10*3/uL (ref 0.7–4.0)
MCH: 28.6 pg (ref 26.0–34.0)
MCHC: 33.3 g/dL (ref 30.0–36.0)
MCV: 85.7 fL (ref 80.0–100.0)
Monocytes Absolute: 0.8 10*3/uL (ref 0.1–1.0)
Monocytes Relative: 10 %
Neutro Abs: 5.3 10*3/uL (ref 1.7–7.7)
Neutrophils Relative %: 61 %
Platelets: 252 10*3/uL (ref 150–400)
RBC: 4.69 MIL/uL (ref 3.87–5.11)
RDW: 15.7 % — ABNORMAL HIGH (ref 11.5–15.5)
WBC: 8.7 10*3/uL (ref 4.0–10.5)
nRBC: 0 % (ref 0.0–0.2)

## 2020-03-28 LAB — IRON AND TIBC
Iron: 87 ug/dL (ref 28–170)
Saturation Ratios: 26 % (ref 10.4–31.8)
TIBC: 338 ug/dL (ref 250–450)
UIBC: 251 ug/dL

## 2020-03-28 LAB — FERRITIN: Ferritin: 70 ng/mL (ref 11–307)

## 2020-03-29 DIAGNOSIS — L570 Actinic keratosis: Secondary | ICD-10-CM | POA: Diagnosis not present

## 2020-03-30 ENCOUNTER — Telehealth: Payer: Self-pay

## 2020-03-30 NOTE — Telephone Encounter (Signed)
FYI on patients bp reading this morning.

## 2020-03-30 NOTE — Telephone Encounter (Signed)
Blood pressures are looking good.  Continue checking intervally

## 2020-03-30 NOTE — Telephone Encounter (Signed)
Called patient left message to notify

## 2020-03-31 DIAGNOSIS — J029 Acute pharyngitis, unspecified: Secondary | ICD-10-CM | POA: Diagnosis not present

## 2020-03-31 DIAGNOSIS — H9209 Otalgia, unspecified ear: Secondary | ICD-10-CM | POA: Diagnosis not present

## 2020-03-31 DIAGNOSIS — J020 Streptococcal pharyngitis: Secondary | ICD-10-CM | POA: Diagnosis not present

## 2020-03-31 DIAGNOSIS — H6982 Other specified disorders of Eustachian tube, left ear: Secondary | ICD-10-CM | POA: Diagnosis not present

## 2020-04-03 ENCOUNTER — Other Ambulatory Visit: Payer: Self-pay

## 2020-04-03 ENCOUNTER — Inpatient Hospital Stay (HOSPITAL_COMMUNITY): Payer: Medicare Other | Admitting: Hematology

## 2020-04-03 VITALS — BP 148/83 | HR 83 | Temp 97.2°F | Resp 18 | Wt 199.6 lb

## 2020-04-03 DIAGNOSIS — D509 Iron deficiency anemia, unspecified: Secondary | ICD-10-CM

## 2020-04-03 DIAGNOSIS — E538 Deficiency of other specified B group vitamins: Secondary | ICD-10-CM | POA: Diagnosis not present

## 2020-04-03 DIAGNOSIS — Z79899 Other long term (current) drug therapy: Secondary | ICD-10-CM | POA: Diagnosis not present

## 2020-04-03 DIAGNOSIS — K5730 Diverticulosis of large intestine without perforation or abscess without bleeding: Secondary | ICD-10-CM | POA: Diagnosis not present

## 2020-04-03 DIAGNOSIS — Z87891 Personal history of nicotine dependence: Secondary | ICD-10-CM | POA: Diagnosis not present

## 2020-04-03 NOTE — Progress Notes (Signed)
[address], [place] [address]   CLINIC:  Medical Oncology/Hematology  PCP:  [name], DO [address] / [place] [address]  [phone]  REASON FOR VISIT:  Follow-up for IDA  PRIOR THERAPY: None  CURRENT THERAPY: Intermittent Feraheme last on 01/07/2020  INTERVAL HISTORY:  [name], a 75 y.o. female, returns for routine follow-up for her IDA. Stacey Reyes was last seen on 01/24/2020.  Today she reports feeling well. She continues feeling great since getting Feraheme on 01/07/2020 and she was able to travel to [place] and enjoy the trip. She continues taking iron tablets once a week. She denies having N/V/D/C, melena, hematochezia, hematuria or nosebleeds. She denies having ankle swelling.   REVIEW OF SYSTEMS:  Review of Systems  Constitutional: Positive for fatigue (80%). Negative for appetite change.  HENT:   Negative for nosebleeds.   Cardiovascular: Negative for leg swelling.  Gastrointestinal: Negative for blood in stool, constipation, diarrhea, nausea and vomiting.  Genitourinary: Negative for hematuria.   All other systems reviewed and are negative.   PAST MEDICAL/SURGICAL HISTORY:  Past Medical History:  Diagnosis Date  . Anemia   . Cataract    removed bilateral  . Chronic cystitis   . Diverticulitis   . Heart murmur   . Hyperlipidemia   . Hypertension   . Personal history of colonic polyps-adenomas 01/07/2012   2009 - 2 diminutive adenomas (prior polyps also) 01/07/2012 - 2 diminutive adenomas     Past Surgical History:  Procedure Laterality Date  . BREAST BIOPSY Right    No Scar seen   . COLONOSCOPY  multiple  . ESOPHAGOGASTRODUODENOSCOPY (EGD) WITH PROPOFOL N/A 11/23/2019   Procedure: ESOPHAGOGASTRODUODENOSCOPY (EGD) WITH PROPOFOL;  Surgeon: Daneil Dolin, MD;  Location[facility];  Service: Endoscopy;  Laterality: N/A;  . GIVENS CAPSULE STUDY N/A 11/23/2019   Procedure: GIVENS CAPSULE  STUDY;  Surgeon: Daneil Dolin, MD;  Location: [facility];  Service: Endoscopy;  Laterality: N/A;    SOCIAL HISTORY:  Social History   Socioeconomic History  . Marital status: Married    Spouse name: Not on file  . Number of children: 1  . Years of education: Not on file  . Highest education level: Not on file  Occupational History  . Occupation: retired  Tobacco Use  . Smoking status: Former Smoker    Quit date: 12/23/1984    Years since quitting: 35.3  . Smokeless tobacco: Never Used  Vaping Use  . Vaping Use: Never used  Substance and Sexual Activity  . Alcohol use: Yes    Comment: rare  . Drug use: No  . Sexual activity: Not on file  Other Topics Concern  . Not on file  Social History Narrative   Patient is married and retired and has 1 grown child   Social Determinants of Radio broadcast assistant Strain: Not on file  Food Insecurity: Not on file  Transportation Needs: Not on file  Physical Activity: Not on file  Stress: Not on file  Social Connections: Not on file  Intimate Partner Violence: Not on file    FAMILY HISTORY:  Family History  Problem Relation Age of Onset  . Colon cancer Mother 17       80's  . Breast cancer Sister   . Asthma Brother   . Colon polyps Neg Hx   . Esophageal cancer Neg Hx   . Rectal cancer Neg Hx   .  Stomach cancer Neg Hx     CURRENT MEDICATIONS:  Current Outpatient Medications  Medication Sig Dispense Refill  . acetaminophen (TYLENOL) 325 MG tablet Take 2 tablets (650 mg total) by mouth every 6 (six) hours as needed for mild pain (or Fever >/= 101). 12 tablet 0  . bisoprolol (ZEBETA) 5 MG tablet Take by mouth.    . Copper Gluconate (COPPER CAPS PO) Take 1 capsule by mouth daily.     . cyanocobalamin (CVS VITAMIN B12) 2000 MCG tablet Take 1 tablet (2,000 mcg total) by mouth daily. (Patient not taking: Reported on 04/03/2020)    . ergocalciferol (VITAMIN D2) 1.25 MG (50000 UT) capsule Take 1 capsule (50,000 Units  total) by mouth once a week. 16 capsule 3  . ferrous sulfate 324 (65 Fe) MG TBEC Take 1 tablet (325 mg total) by mouth in the morning and at bedtime for 15 days. (Patient not taking: Reported on 01/25/2020) 90 tablet 0  . lisinopril (ZESTRIL) 5 MG tablet Take 1 tablet (5 mg total) by mouth daily. 90 tablet 3  . Magnesium 200 MG TABS Take 1 tablet (200 mg total) by mouth daily. 30 tablet 6  . Multiple Vitamin (MULTIVITAMIN) capsule Take 1 capsule by mouth daily.     . pantoprazole (PROTONIX) 40 MG tablet Take 1 tablet (40 mg total) by mouth daily. 90 tablet 1  . rosuvastatin (CRESTOR) 10 MG tablet Take 10 mg by mouth at bedtime.    . thiamine (VITAMIN B-1) 100 MG tablet Take 100 mg by mouth daily.     . vitamin E 400 UNIT capsule Take 400 Units by mouth daily.      No current facility-administered medications for this visit.    ALLERGIES:  Allergies  Allergen Reactions  . Sulfa Antibiotics Rash    PHYSICAL EXAM:  Performance status (ECOG): 1 - Symptomatic but completely ambulatory  Vitals:   04/03/20 1437  BP: (!) 148/83  Pulse: 83  Resp: 18  Temp: (!) 97.2 F (36.2 C)  SpO2: 98%   Wt Readings from Last 3 Encounters:  04/03/20 199 lb 9.6 oz (90.5 kg)  03/08/20 195 lb (88.5 kg)  01/25/20 196 lb (88.9 kg)   Physical Exam Vitals reviewed.  Constitutional:      Appearance: Normal appearance. She is obese.  Cardiovascular:     Rate and Rhythm: Normal rate and regular rhythm.     Pulses: Normal pulses.     Heart sounds: Normal heart sounds.  Pulmonary:     Effort: Pulmonary effort is normal.     Breath sounds: Normal breath sounds.  Chest:  Breasts:     Right: No axillary adenopathy.     Left: No axillary adenopathy.    Abdominal:     Palpations: Abdomen is soft. There is no hepatomegaly, splenomegaly or mass.     Tenderness: There is no abdominal tenderness.     Hernia: No hernia is present.  Musculoskeletal:     Right lower leg: No edema.     Left lower leg: No  edema.  Lymphadenopathy:     Upper Body:     Right upper body: No axillary or pectoral adenopathy.     Left upper body: No axillary or pectoral adenopathy.     Lower Body: No right inguinal adenopathy. No left inguinal adenopathy.  Neurological:     General: No focal deficit present.     Mental Status: She is alert and oriented to person, place, and time.  Psychiatric:  Mood and Affect: Mood normal.        Behavior: Behavior normal.     LABORATORY DATA:  I have reviewed the labs as listed.  CBC Latest Ref Rng & Units 03/28/2020 02/28/2020 01/19/2020  WBC 4.0 - 10.5 K/uL 8.7 7.4 6.9  Hemoglobin 12.0 - 15.0 g/dL 13.4 14.3 12.7  Hematocrit 36.0 - 46.0 % 40.2 43.2 39.9  Platelets 150 - 400 K/uL 252 236 235   CMP Latest Ref Rng & Units 12/27/2019 12/12/2019 11/24/2019  Glucose 70 - 99 mg/dL 105(H) 112(H) 98  BUN 8 - 23 mg/dL 10 11 8   Creatinine 0.44 - 1.00 mg/dL 0.61 0.50 0.65  Sodium 135 - 145 mmol/L 135 136 137  Potassium 3.5 - 5.1 mmol/L 3.7 3.7 3.8  Chloride 98 - 111 mmol/L 101 106 104  CO2 22 - 32 mmol/L 25 23 25   Calcium 8.9 - 10.3 mg/dL 8.9 8.4(L) 8.4(L)  Total Protein 6.5 - 8.1 g/dL 5.9(L) 5.8(L) -  Total Bilirubin 0.3 - 1.2 mg/dL 0.5 0.5 -  Alkaline Phos 38 - 126 U/L 36(L) 27(L) -  AST 15 - 41 U/L 26 23 -  ALT 0 - 44 U/L 18 15 -      Component Value Date/Time   RBC 4.69 03/28/2020 1359   MCV 85.7 03/28/2020 1359   MCV 90 11/30/2019 0918   MCH 28.6 03/28/2020 1359   MCHC 33.3 03/28/2020 1359   RDW 15.7 (H) 03/28/2020 1359   RDW 15.6 (H) 11/30/2019 0918   LYMPHSABS 2.2 03/28/2020 1359   LYMPHSABS 1.5 11/26/2019 1158   MONOABS 0.8 03/28/2020 1359   EOSABS 0.3 03/28/2020 1359   EOSABS 0.2 11/26/2019 1158   BASOSABS 0.1 03/28/2020 1359   BASOSABS 0.0 11/26/2019 1158   Lab Results  Component Value Date   TIBC 338 03/28/2020   TIBC 346 02/28/2020   TIBC 343 01/19/2020   FERRITIN 70 03/28/2020   FERRITIN 69 02/28/2020   FERRITIN 250 01/19/2020   IRONPCTSAT 26  03/28/2020   IRONPCTSAT 27 02/28/2020   IRONPCTSAT 11 01/19/2020    DIAGNOSTIC IMAGING:  I have independently reviewed the scans and discussed with the patient. No results found.   ASSESSMENT:  1. Severe iron deficiency anemia: -Admitted on 03/24/2019 with hemoglobin 5.3 and ferritin of 6 and B12 of 145. Status post 3 units PRBC and 1 infusion of Feraheme on 03/24/2019. -Colonoscopy on 11/17/2018 shows 2 diminutive polyps in the transverse colon, diverticulosis in the sigmoid colon and descending colon transverse colon. -EGD shows duodenal erosions. -Multiple stool occult blood was positive. -Etiology of anemia is blood loss.  No improvement despite taking iron tablet twice daily. -Feraheme on 12/30/2019 and 01/07/2020.  2. B12 deficiency: -History of low B12 levels and on oral B12 therapy.   PLAN:  1. Severe iron deficiency anemia: -Last Feraheme infusion on 01/07/2020. - Reviewed labs from 03/28/2020.  Ferritin is stable at 70.  Hemoglobin slightly decreased to 13.4 from 14.3 previously. - Recommend Feraheme weekly x2. - RTC 4 months with repeat labs.  2. B12 deficiency: -Continue B12 tablet daily.  Orders placed this encounter:  No orders of the defined types were placed in this encounter.    Derek Jack, MD [place] [phone]   I, Milinda Antis, am acting as a scribe for Dr. Sanda Linger.  I, Derek Jack MD, have reviewed the above documentation for accuracy and completeness, and I agree with the above.

## 2020-04-03 NOTE — Patient Instructions (Signed)
[place] at [facility] Discharge Instructions  You were seen today by Dr. Delton Coombes. He went over your recent results. You will be scheduled for 2 Feraheme infusions in February. Dr. Delton Coombes will see you back in 4 months for labs and follow up.   Thank you for choosing [place] at [facility] to provide your oncology and hematology care.  To afford each patient quality time with our provider, please arrive at least 15 minutes before your scheduled appointment time.   If you have a lab appointment with the [facility] please come in thru the Main Entrance and check in at the main information desk  You need to re-schedule your appointment should you arrive 10 or more minutes late.  We strive to give you quality time with our providers, and arriving late affects you and other patients whose appointments are after yours.  Also, if you no show three or more times for appointments you may be dismissed from the clinic at the providers discretion.     Again, thank you for choosing [facility]  Our hope is that these requests will decrease the amount of time that you wait before being seen by our physicians.       _____________________________________________________________  Should you have questions after your visit to [facility], please contact our office at [phone] between the hours of 8:00 a.m. and 4:30 p.m.  Voicemails left after 4:00 p.m. will not be returned until the following business day.  For prescription refill requests, have your pharmacy contact our office and allow 72 hours.    Cancer Center Support Programs:   > Cancer Support Group  2nd Tuesday of the month 1pm-2pm, Journey Room

## 2020-04-11 ENCOUNTER — Other Ambulatory Visit: Payer: Self-pay | Admitting: Family Medicine

## 2020-04-28 ENCOUNTER — Other Ambulatory Visit: Payer: Self-pay

## 2020-04-28 ENCOUNTER — Inpatient Hospital Stay (HOSPITAL_COMMUNITY): Payer: Medicare Other | Attending: Hematology

## 2020-04-28 VITALS — BP 137/63 | HR 84 | Temp 96.9°F | Resp 18

## 2020-04-28 DIAGNOSIS — D500 Iron deficiency anemia secondary to blood loss (chronic): Secondary | ICD-10-CM | POA: Insufficient documentation

## 2020-04-28 DIAGNOSIS — E538 Deficiency of other specified B group vitamins: Secondary | ICD-10-CM | POA: Insufficient documentation

## 2020-04-28 DIAGNOSIS — D509 Iron deficiency anemia, unspecified: Secondary | ICD-10-CM

## 2020-04-28 DIAGNOSIS — K5721 Diverticulitis of large intestine with perforation and abscess with bleeding: Secondary | ICD-10-CM | POA: Diagnosis not present

## 2020-04-28 DIAGNOSIS — Z86010 Personal history of colonic polyps: Secondary | ICD-10-CM | POA: Insufficient documentation

## 2020-04-28 DIAGNOSIS — K921 Melena: Secondary | ICD-10-CM | POA: Insufficient documentation

## 2020-04-28 MED ORDER — SODIUM CHLORIDE 0.9 % IV SOLN: INTRAVENOUS | Status: DC

## 2020-04-28 MED ORDER — SODIUM CHLORIDE 0.9 % IV SOLN
510.0000 mg | Freq: Once | INTRAVENOUS | Status: AC
  Administered 2020-04-28: 510 mg via INTRAVENOUS
  Filled 2020-04-28: qty 17

## 2020-04-28 NOTE — Progress Notes (Signed)
Patient presents today for Feraheme infusion.  Vital signs WNL.  No complaints at this time.  Treatment given today per MD orders.  Tolerated infusion without adverse affects.  Vital signs stable.  No complaints at this time.  Discharge from clinic ambulatory in stable condition.  Alert and oriented X 3.  Follow up with [facility] as scheduled.

## 2020-04-28 NOTE — Patient Instructions (Signed)
[facility] Discharge Instructions for Patients Receiving Chemotherapy  Today you received the following chemotherapy agents   To help prevent nausea and vomiting after your treatment, we encourage you to take your nausea medication   If you develop nausea and vomiting that is not controlled by your nausea medication, call the clinic.   BELOW ARE SYMPTOMS THAT SHOULD BE REPORTED IMMEDIATELY:  *FEVER GREATER THAN 100.5 F  *CHILLS WITH OR WITHOUT FEVER  NAUSEA AND VOMITING THAT IS NOT CONTROLLED WITH YOUR NAUSEA MEDICATION  *UNUSUAL SHORTNESS OF BREATH  *UNUSUAL BRUISING OR BLEEDING  TENDERNESS IN MOUTH AND THROAT WITH OR WITHOUT PRESENCE OF ULCERS  *URINARY PROBLEMS  *BOWEL PROBLEMS  UNUSUAL RASH Items with * indicate a potential emergency and should be followed up as soon as possible.  Feel free to call the clinic should you have any questions or concerns. The clinic phone number is [phone].  Please show the CHEMO ALERT CARD at check-in to the Emergency Department and triage nurse.   

## 2020-05-05 ENCOUNTER — Other Ambulatory Visit: Payer: Self-pay

## 2020-05-05 ENCOUNTER — Inpatient Hospital Stay (HOSPITAL_COMMUNITY): Payer: Medicare Other

## 2020-05-05 VITALS — BP 149/69 | HR 62 | Temp 97.0°F | Resp 18

## 2020-05-05 DIAGNOSIS — K921 Melena: Secondary | ICD-10-CM | POA: Diagnosis not present

## 2020-05-05 DIAGNOSIS — D509 Iron deficiency anemia, unspecified: Secondary | ICD-10-CM | POA: Diagnosis not present

## 2020-05-05 DIAGNOSIS — Z86010 Personal history of colonic polyps: Secondary | ICD-10-CM | POA: Diagnosis not present

## 2020-05-05 DIAGNOSIS — K5721 Diverticulitis of large intestine with perforation and abscess with bleeding: Secondary | ICD-10-CM | POA: Diagnosis not present

## 2020-05-05 DIAGNOSIS — D500 Iron deficiency anemia secondary to blood loss (chronic): Secondary | ICD-10-CM | POA: Diagnosis not present

## 2020-05-05 DIAGNOSIS — E538 Deficiency of other specified B group vitamins: Secondary | ICD-10-CM | POA: Diagnosis not present

## 2020-05-05 MED ORDER — SODIUM CHLORIDE 0.9 % IV SOLN: Freq: Once | INTRAVENOUS | Status: AC

## 2020-05-05 MED ORDER — SODIUM CHLORIDE 0.9 % IV SOLN
510.0000 mg | Freq: Once | INTRAVENOUS | Status: AC
  Administered 2020-05-05: 510 mg via INTRAVENOUS
  Filled 2020-05-05: qty 510

## 2020-05-05 NOTE — Progress Notes (Signed)
Patient presents today for Feraheme infusion.  Vital signs WNL.  No new complaints since last visit.  Treatment given today per MD orders.  Tolerated infusion without adverse affects.  Vital signs stable.  No complaints at this time.  Discharge from clinic ambulatory in stable condition.  Alert and oriented X 3.  Follow up with [facility] as scheduled.

## 2020-05-05 NOTE — Patient Instructions (Signed)
[facility] Discharge Instructions for Patients Receiving Chemotherapy  Today you received the following chemotherapy agents   To help prevent nausea and vomiting after your treatment, we encourage you to take your nausea medication   If you develop nausea and vomiting that is not controlled by your nausea medication, call the clinic.   BELOW ARE SYMPTOMS THAT SHOULD BE REPORTED IMMEDIATELY:  *FEVER GREATER THAN 100.5 F  *CHILLS WITH OR WITHOUT FEVER  NAUSEA AND VOMITING THAT IS NOT CONTROLLED WITH YOUR NAUSEA MEDICATION  *UNUSUAL SHORTNESS OF BREATH  *UNUSUAL BRUISING OR BLEEDING  TENDERNESS IN MOUTH AND THROAT WITH OR WITHOUT PRESENCE OF ULCERS  *URINARY PROBLEMS  *BOWEL PROBLEMS  UNUSUAL RASH Items with * indicate a potential emergency and should be followed up as soon as possible.  Feel free to call the clinic should you have any questions or concerns. The clinic phone number is [phone].  Please show the CHEMO ALERT CARD at check-in to the Emergency Department and triage nurse.   

## 2020-05-16 DIAGNOSIS — L578 Other skin changes due to chronic exposure to nonionizing radiation: Secondary | ICD-10-CM | POA: Diagnosis not present

## 2020-05-16 DIAGNOSIS — L821 Other seborrheic keratosis: Secondary | ICD-10-CM | POA: Diagnosis not present

## 2020-05-16 DIAGNOSIS — L718 Other rosacea: Secondary | ICD-10-CM | POA: Diagnosis not present

## 2020-05-16 DIAGNOSIS — Z85828 Personal history of other malignant neoplasm of skin: Secondary | ICD-10-CM | POA: Diagnosis not present

## 2020-05-16 DIAGNOSIS — L905 Scar conditions and fibrosis of skin: Secondary | ICD-10-CM | POA: Diagnosis not present

## 2020-05-16 DIAGNOSIS — D229 Melanocytic nevi, unspecified: Secondary | ICD-10-CM | POA: Diagnosis not present

## 2020-07-04 ENCOUNTER — Other Ambulatory Visit (HOSPITAL_COMMUNITY): Payer: Self-pay

## 2020-07-04 ENCOUNTER — Other Ambulatory Visit: Payer: Medicare Other

## 2020-07-04 ENCOUNTER — Other Ambulatory Visit: Payer: Self-pay

## 2020-07-04 DIAGNOSIS — D649 Anemia, unspecified: Secondary | ICD-10-CM

## 2020-07-04 DIAGNOSIS — D509 Iron deficiency anemia, unspecified: Secondary | ICD-10-CM | POA: Diagnosis not present

## 2020-07-04 DIAGNOSIS — D508 Other iron deficiency anemias: Secondary | ICD-10-CM

## 2020-07-04 LAB — HEMOGLOBIN, FINGERSTICK: Hemoglobin: 12.7 g/dL (ref 11.1–15.9)

## 2020-07-05 LAB — COMPREHENSIVE METABOLIC PANEL
ALT: 57 IU/L — ABNORMAL HIGH (ref 0–32)
AST: 50 IU/L — ABNORMAL HIGH (ref 0–40)
Albumin/Globulin Ratio: 2 (ref 1.2–2.2)
Albumin: 4.2 g/dL (ref 3.7–4.7)
Alkaline Phosphatase: 45 IU/L (ref 44–121)
BUN/Creatinine Ratio: 14 (ref 12–28)
BUN: 10 mg/dL (ref 8–27)
Bilirubin Total: 0.5 mg/dL (ref 0.0–1.2)
CO2: 22 mmol/L (ref 20–29)
Calcium: 9.2 mg/dL (ref 8.7–10.3)
Chloride: 103 mmol/L (ref 96–106)
Creatinine, Ser: 0.72 mg/dL (ref 0.57–1.00)
Globulin, Total: 2.1 g/dL (ref 1.5–4.5)
Glucose: 99 mg/dL (ref 65–99)
Potassium: 4.4 mmol/L (ref 3.5–5.2)
Sodium: 140 mmol/L (ref 134–144)
Total Protein: 6.3 g/dL (ref 6.0–8.5)
eGFR: 87 mL/min/{1.73_m2} (ref >59)

## 2020-07-05 LAB — IRON AND TIBC
Iron Saturation: 31 % (ref 15–55)
Iron: 86 ug/dL (ref 27–139)
Total Iron Binding Capacity: 275 ug/dL (ref 250–450)
UIBC: 189 ug/dL (ref 118–369)

## 2020-07-05 LAB — CBC WITH DIFFERENTIAL/PLATELET
Basophils Absolute: 0.1 10*3/uL (ref 0.0–0.2)
Basos: 1 %
EOS (ABSOLUTE): 0.3 10*3/uL (ref 0.0–0.4)
Eos: 5 %
Hematocrit: 39 % (ref 34.0–46.6)
Hemoglobin: 13.1 g/dL (ref 11.1–15.9)
Immature Grans (Abs): 0 10*3/uL (ref 0.0–0.1)
Immature Granulocytes: 0 %
Lymphocytes Absolute: 1.6 10*3/uL (ref 0.7–3.1)
Lymphs: 28 %
MCH: 31 pg (ref 26.6–33.0)
MCHC: 33.6 g/dL (ref 31.5–35.7)
MCV: 92 fL (ref 79–97)
Monocytes Absolute: 0.5 10*3/uL (ref 0.1–0.9)
Monocytes: 9 %
Neutrophils Absolute: 3.3 10*3/uL (ref 1.4–7.0)
Neutrophils: 57 %
Platelets: 213 10*3/uL (ref 150–450)
RBC: 4.22 x10E6/uL (ref 3.77–5.28)
RDW: 12.6 % (ref 11.7–15.4)
WBC: 5.8 10*3/uL (ref 3.4–10.8)

## 2020-07-05 LAB — FERRITIN: Ferritin: 371 ng/mL — ABNORMAL HIGH (ref 15–150)

## 2020-07-13 ENCOUNTER — Telehealth (HOSPITAL_COMMUNITY): Payer: Self-pay | Admitting: *Deleted

## 2020-07-13 NOTE — Telephone Encounter (Signed)
Patient called yesterday regarding recent lab results.  Per Dr. Delton Coombes, patient may follow up with PCP, as ferritin is the only abnormal value.  Patient aware and verbalized understanding.

## 2020-07-20 DIAGNOSIS — Z23 Encounter for immunization: Secondary | ICD-10-CM | POA: Diagnosis not present

## 2020-07-22 IMAGING — MR MR CARD MORPHOLOGY WO/W CM
42 of 45 series · 42 of 45 positions shown · IV contrast (gadavist)
Comparison: none

CLINICAL DATA: 73F with severe LVH on echo. PYP scan equivocal for
amyloid

EXAM:
CARDIAC MRI
TECHNIQUE: The patient was scanned on a 1.5 Tesla Siemens magnet. A dedicated
cardiac coil was used. Functional imaging was done using Fiesta
sequences. [DATE], and 4 chamber views were done to assess for RWMA's.
Modified [place] rule using a short axis stack was used to
calculate an ejection fraction on a dedicated work station using
Circle software. The patient received 10 cc of Gadavist. After 10
minutes inversion recovery sequences were used to assess for
infiltration and scar tissue.
CONTRAST:  10 cc  of Gadavist

[Series 4: [id number] · axial · 8.0mm · 1.33mm/px · 1 of 16 slices shown]
[im 1/16]
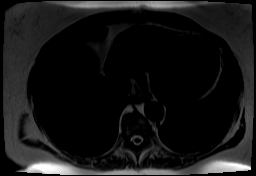

[Series 8: bSSFP · oblique · 8.0mm · 1.61mm/px · 1 of 25 slices shown (1 of 26)]
[im 1/25]
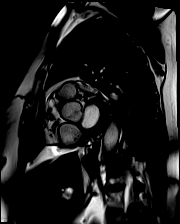

[Series 9: bSSFP · oblique · 8.0mm · 1.61mm/px · 1 of 25 slices shown (2 of 26)]
[im 1/25]
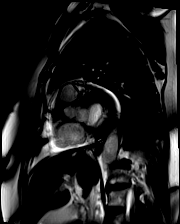

[Series 10: bSSFP · oblique · 8.0mm · 1.61mm/px · 1 of 25 slices shown (3 of 26)]
[im 1/25]
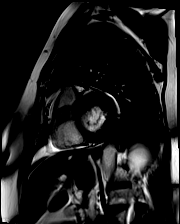

[Series 11: bSSFP · oblique · 8.0mm · 1.61mm/px · 1 of 25 slices shown (4 of 26)]
[im 1/25]
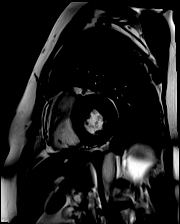

[Series 12: bSSFP · oblique · 8.0mm · 1.61mm/px · 1 of 25 slices shown (5 of 26)]
[im 1/25]
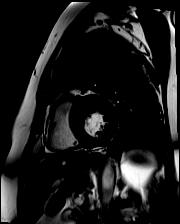

[Series 13: bSSFP · oblique · 8.0mm · 1.61mm/px · 1 of 25 slices shown (6 of 26)]
[im 1/25]
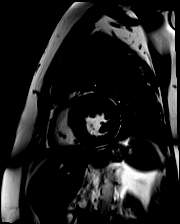

[Series 14: bSSFP · oblique · 8.0mm · 1.61mm/px · 1 of 25 slices shown (7 of 26)]
[im 1/25]
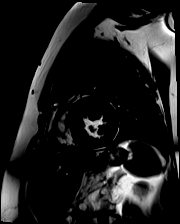

[Series 15: bSSFP · oblique · 8.0mm · 1.61mm/px · 1 of 25 slices shown (8 of 26)]
[im 1/25]
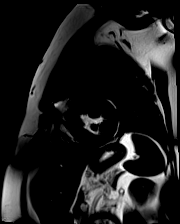

[Series 16: bSSFP · oblique · 8.0mm · 1.61mm/px · 1 of 25 slices shown (9 of 26)]
[im 1/25]
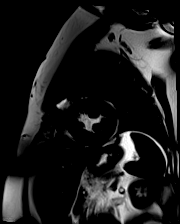

[Series 17: bSSFP · oblique · 8.0mm · 1.61mm/px · 1 of 25 slices shown (10 of 26)]
[im 1/25]
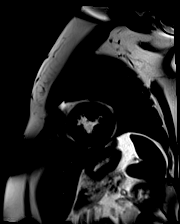

[Series 18: bSSFP · oblique · 8.0mm · 1.61mm/px · 1 of 25 slices shown (11 of 26)]
[im 1/25]
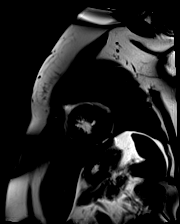

[Series 19: bSSFP · oblique · 8.0mm · 1.61mm/px · 1 of 25 slices shown (12 of 26)]
[im 1/25]
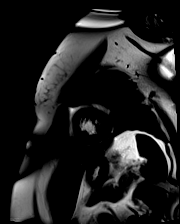

[Series 20: bSSFP · oblique · 8.0mm · 1.61mm/px · 1 of 25 slices shown (13 of 26)]
[im 1/25]
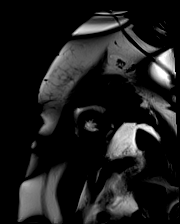

[Series 21: bSSFP · oblique · 8.0mm · 1.61mm/px · 1 of 25 slices shown (14 of 26)]
[im 1/25]
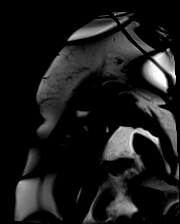

[Series 22: bSSFP · oblique · 8.0mm · 1.61mm/px · 1 of 25 slices shown (15 of 26)]
[im 1/25]
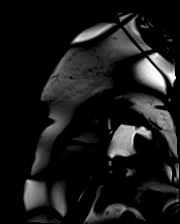

[Series 23: bSSFP · oblique · 8.0mm · 1.61mm/px · 1 of 25 slices shown (16 of 26)]
[im 1/25]
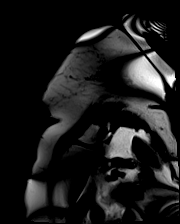

[Series 24: (id)_long_t1 · sagittal · 8.0mm · 1.41mm/px · 1 of 24 slices shown]
[im 1/24]
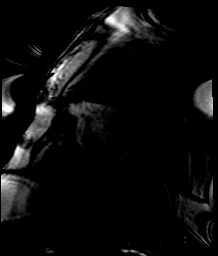

[Series 25: (id)_long_t1_moco · sagittal · 8.0mm · 1.41mm/px · 1 of 24 slices shown]
[im 1/24]
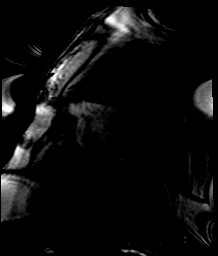

[Series 28: (id)_trufi · sagittal · 8.0mm · 1.88mm/px · 1 of 9 slices shown]
[im 1/9]
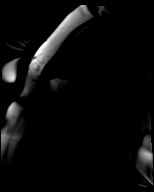

[Series 29: (id)_trufi_moco · sagittal · 8.0mm · 1.88mm/px · 1 of 9 slices shown]
[im 1/9]
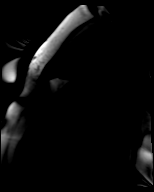

[Series 30: (id)_trufi_moco_t2 · sagittal · 8.0mm · 1.88mm/px · 1 of 3 slices shown]
[im 1/3]
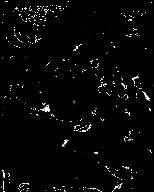

[Series 32: bSSFP · oblique · 6.0mm · 1.41mm/px · 1 of 25 slices shown (17 of 26)]
[im 1/25]
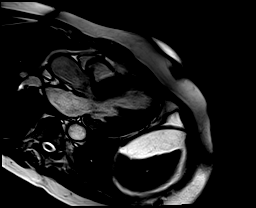

[Series 33: bSSFP · coronal · 6.0mm · 1.41mm/px · 1 of 25 slices shown (18 of 26)]
[im 1/25]
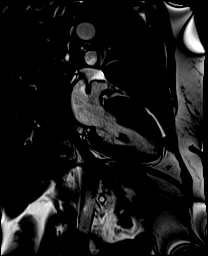

[Series 34: bSSFP · axial · 6.0mm · 1.41mm/px · 1 of 25 slices shown (19 of 26)]
[im 1/25]
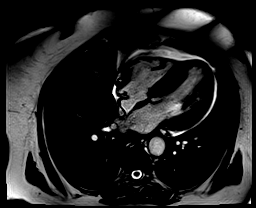

[Series 35: cine rvot · sagittal · 6.0mm · 1.41mm/px · 1 of 25 slices shown]
[im 1/25]
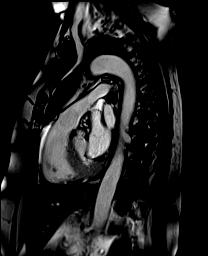

[Series 36: bSSFP · coronal · 6.0mm · 1.41mm/px · 1 of 25 slices shown (20 of 26)]
[im 1/25]
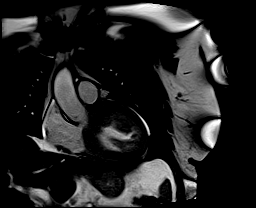

[Series 37: cine rvit · coronal · 6.0mm · 1.41mm/px · 1 of 25 slices shown]
[im 1/25]
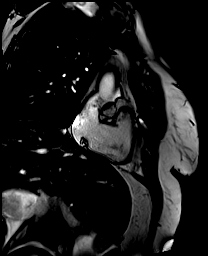

[Series 38: aortic valve cine · oblique · 6.0mm · 1.41mm/px · 1 of 25 slices shown]
[im 1/25]
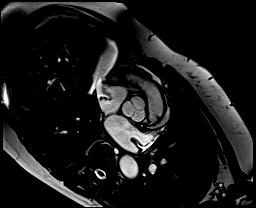

[Series 40: lge_single shot sa · oblique · 8.0mm · 1.98mm/px · 1 of 10 slices shown (1 of 2)]
[im 1/10]
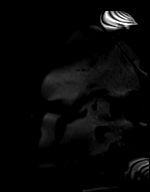

[Series 41: lge_single shot sa · oblique · 8.0mm · 1.98mm/px · 1 of 10 slices shown (2 of 2)]
[im 1/10]
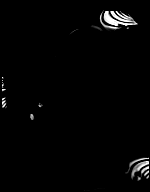

[Series 44: lge_single shot 4 · axial · 6.0mm · 1.98mm/px · 1 of 1 slices shown (1 of 2)]
[im 1/1]
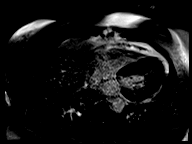

[Series 45: lge_single shot 4 · axial · 6.0mm · 1.98mm/px · 1 of 1 slices shown (2 of 2)]
[im 1/1]
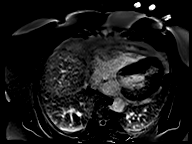

[Series 52: (id)_short_t1 · sagittal · 8.0mm · 1.88mm/px · 1 of 27 slices shown]
[im 1/27]
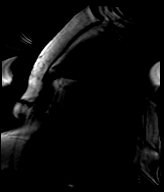

[Series 53: (id)_short_t1_moco · sagittal · 8.0mm · 1.88mm/px · 1 of 27 slices shown]
[im 1/27]
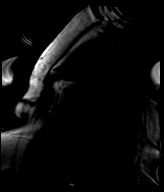

[Series 54: (id)_short_t1_moco_t1 · sagittal · 8.0mm · 1.88mm/px · 1 of 6 slices shown]
[im 1/6]
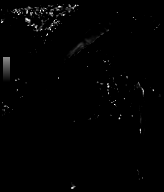

[Series 59: bSSFP · oblique · 6.0mm · 1.73mm/px · 1 of 15 slices shown (21 of 26)]
[im 1/15]
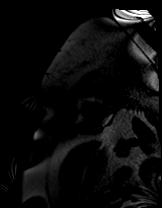

[Series 60: bSSFP · oblique · 6.0mm · 1.73mm/px · 1 of 15 slices shown (22 of 26)]
[im 1/15]
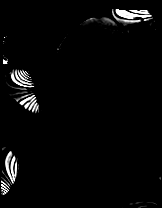

[Series 63: bSSFP · axial · 6.0mm · 1.73mm/px · 1 of 1 slices shown (23 of 26)]
[im 1/1]
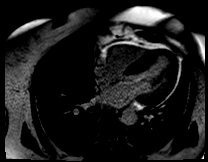

[Series 64: bSSFP · axial · 6.0mm · 1.73mm/px · 1 of 1 slices shown (24 of 26)]
[im 1/1]
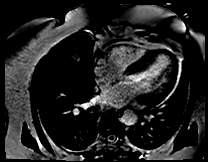

[Series 67: bSSFP · axial · 6.0mm · 1.73mm/px · 1 of 1 slices shown (25 of 26)]
[im 1/1]
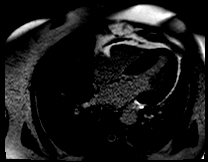

[Series 68: bSSFP · axial · 6.0mm · 1.73mm/px · 1 of 1 slices shown (26 of 26)]
[im 1/1]
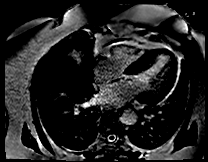

[42 of 45 positions shown; findings below may reference images not displayed]

FINDINGS: Left ventricle:

-Asymmetric hypertrophy measuring up to 25mm in basal septum (11mm
in posterior wall)

-LVOT obstruction due to systolic anterior motion of the anterior
leaflet of the mitral valve

-Normal size

-Hyperdynamic systolic function

-Mild ECV elevation (29%)

-No LGE

LV EF:  73% (Normal 56-78%)

Absolute volumes:

LV EDV: 130mL (Normal 52-141 mL)

LV ESV: 35mL (Normal 13-51 mL)

LV SV: 95mL (Normal 33-97 mL)

CO: 5.5L/min (Normal 2.7-6.0 L/min)

Indexed volumes:

LV EDV: 64mL/sq-m (Normal 41-81 mL/sq-m)

LV ESV: 17mL/sq-m (Normal 12-21 mL/sq-m)

LV SV: 46mL/sq-m (Normal 26-56 mL/sq-m)

CI: 2.7L/min/sq-m (Normal 1.8-3.8 L/min/sq-m)

Right ventricle: Normal size and systolic function

RV EF: 70% (Normal 47-80%)

Absolute volumes:

RV EDV: 96mL (Normal 58-154 mL)

RV ESV: 29mL (Normal 12-68 mL)

RV SV: 67mL (Normal 35-98 mL)

CO: 3.9L/min (Normal 2.7-6 L/min)

Indexed volumes:

RV EDV: 47mL/sq-m (Normal 48-87 mL/sq-m)

RV ESV: 14mL/sq-m (Normal 11-28 mL/sq-m)

RV SV: 33mL/sq-m (Normal 27-57 mL/sq-m)

CI: 1.9L/min/sq-m (Normal 1.8-3.8 L/min/sq-m)

Left atrium: Mild enlargement

Right atrium: Normal size

Mitral valve: Mild regurgitation

Aortic valve: No regurgitation

Tricuspid valve: No regurgitation

Pulmonic valve: No regurgitation

Aorta: Mildly dilated ascending aorta measuring 36mm

Coronary arteries: Normal origins

Pericardium: Normal
IMPRESSION: 1.  No evidence of cardiac amyloidosis

2. Asymmetric hypertrophy measuring up to 25mm in basal septum (11mm
in posterior wall), consistent with hypertrophic cardiomyopathy

3. LVOT obstruction due to systolic anterior motion of the anterior
leaflet of the mitral valve, consistent with HOCM

4.  No late gadolinium enhancement to suggest focal myocardial scar

5.  Normal LV size with hyperdynamic systolic function (EF 73%)

6.  Normal RV size and systolic function (EF 70%)

## 2020-08-01 ENCOUNTER — Other Ambulatory Visit: Payer: Self-pay

## 2020-08-01 ENCOUNTER — Inpatient Hospital Stay (HOSPITAL_COMMUNITY): Payer: Medicare Other | Attending: Hematology

## 2020-08-01 DIAGNOSIS — D509 Iron deficiency anemia, unspecified: Secondary | ICD-10-CM | POA: Insufficient documentation

## 2020-08-01 LAB — IRON AND TIBC
Iron: 50 ug/dL (ref 28–170)
Saturation Ratios: 14 % (ref 10.4–31.8)
TIBC: 346 ug/dL (ref 250–450)
UIBC: 296 ug/dL

## 2020-08-01 LAB — CBC WITH DIFFERENTIAL/PLATELET
Abs Immature Granulocytes: 0.02 10*3/uL (ref 0.00–0.07)
Basophils Absolute: 0.1 10*3/uL (ref 0.0–0.1)
Basophils Relative: 1 %
Eosinophils Absolute: 0.2 10*3/uL (ref 0.0–0.5)
Eosinophils Relative: 3 %
HCT: 38 % (ref 36.0–46.0)
Hemoglobin: 12.6 g/dL (ref 12.0–15.0)
Immature Granulocytes: 0 %
Lymphocytes Relative: 23 %
Lymphs Abs: 1.8 10*3/uL (ref 0.7–4.0)
MCH: 31 pg (ref 26.0–34.0)
MCHC: 33.2 g/dL (ref 30.0–36.0)
MCV: 93.6 fL (ref 80.0–100.0)
Monocytes Absolute: 0.9 10*3/uL (ref 0.1–1.0)
Monocytes Relative: 12 %
Neutro Abs: 4.6 10*3/uL (ref 1.7–7.7)
Neutrophils Relative %: 61 %
Platelets: 236 10*3/uL (ref 150–400)
RBC: 4.06 MIL/uL (ref 3.87–5.11)
RDW: 12.7 % (ref 11.5–15.5)
WBC: 7.6 10*3/uL (ref 4.0–10.5)
nRBC: 0 % (ref 0.0–0.2)

## 2020-08-01 LAB — COMPREHENSIVE METABOLIC PANEL
ALT: 40 U/L (ref 0–44)
AST: 40 U/L (ref 15–41)
Albumin: 4.1 g/dL (ref 3.5–5.0)
Alkaline Phosphatase: 39 U/L (ref 38–126)
Anion gap: 5 (ref 5–15)
BUN: 11 mg/dL (ref 8–23)
CO2: 28 mmol/L (ref 22–32)
Calcium: 9 mg/dL (ref 8.9–10.3)
Chloride: 102 mmol/L (ref 98–111)
Creatinine, Ser: 0.63 mg/dL (ref 0.44–1.00)
GFR, Estimated: >60 mL/min (ref >60)
Glucose, Bld: 102 mg/dL — ABNORMAL HIGH (ref 70–99)
Potassium: 3.5 mmol/L (ref 3.5–5.1)
Sodium: 135 mmol/L (ref 135–145)
Total Bilirubin: 0.8 mg/dL (ref 0.3–1.2)
Total Protein: 6.7 g/dL (ref 6.5–8.1)

## 2020-08-01 LAB — FERRITIN: Ferritin: 87 ng/mL (ref 11–307)

## 2020-08-02 ENCOUNTER — Inpatient Hospital Stay (HOSPITAL_COMMUNITY): Payer: Medicare Other | Attending: Physician Assistant | Admitting: Physician Assistant

## 2020-08-02 VITALS — BP 128/76 | HR 78 | Temp 97.0°F | Resp 18 | Wt 198.0 lb

## 2020-08-02 DIAGNOSIS — D500 Iron deficiency anemia secondary to blood loss (chronic): Secondary | ICD-10-CM | POA: Diagnosis not present

## 2020-08-02 DIAGNOSIS — Z87891 Personal history of nicotine dependence: Secondary | ICD-10-CM | POA: Insufficient documentation

## 2020-08-02 DIAGNOSIS — D509 Iron deficiency anemia, unspecified: Secondary | ICD-10-CM

## 2020-08-02 DIAGNOSIS — E538 Deficiency of other specified B group vitamins: Secondary | ICD-10-CM | POA: Insufficient documentation

## 2020-08-02 NOTE — Progress Notes (Signed)
Stacey Reyes, [place] [id number]   CLINIC:  Medical Oncology/Hematology  PCP:  [name], DO [place] [id number] [phone]   REASON FOR VISIT:  Follow-up for iron deficiency  CURRENT THERAPY: Intermittent IV iron infusions (last IV Feraheme on 04/28/20 and 05/05/20)  INTERVAL HISTORY:  [name] 75 y.o. female returns for routine follow-up of her iron deficiency and anemia.  She was last seen in office by Dr. Delton Coombes on 04/03/2020.  She received IV Feraheme on 04/28/20 and 05/05/20.  Her main complaint is feeling fatigued and having dyspnea on exertion.  In the past, IV iron helped with those symptoms.  She initially felt better after her last IV iron infusion, and those effects lasted about 3 months.  However, about 1 month ago, she had onset of progressive fatigue.  She reports dark brown stool, but denies any bright red blood per rectum or melena.  No other sources of blood loss.  She has 25% energy and 100% appetite. She endorses that she is maintaining a stable weight.    REVIEW OF SYSTEMS:  Review of Systems  Constitutional: Positive for fatigue (energy 25%). Negative for appetite change, chills, diaphoresis, fever and unexpected weight change.  HENT:   Negative for lump/mass and nosebleeds.   Eyes: Negative for eye problems.  Respiratory: Positive for shortness of breath (dyspnea on exertion). Negative for cough and hemoptysis.   Cardiovascular: Negative for chest pain, leg swelling and palpitations.  Gastrointestinal: Negative for abdominal pain, blood in stool, constipation, diarrhea, nausea and vomiting.  Genitourinary: Negative for hematuria.   Skin: Negative.   Neurological: Positive for headaches (first thing in the morning). Negative for dizziness and light-headedness.  Hematological: Does not bruise/bleed easily.  Psychiatric/Behavioral: Positive for sleep disturbance.      PAST MEDICAL/SURGICAL  HISTORY:  Past Medical History:  Diagnosis Date  . Anemia   . Cataract    removed bilateral  . Chronic cystitis   . Diverticulitis   . Heart murmur   . Hyperlipidemia   . Hypertension   . Personal history of colonic polyps-adenomas 01/07/2012   2009 - 2 diminutive adenomas (prior polyps also) 01/07/2012 - 2 diminutive adenomas     Past Surgical History:  Procedure Laterality Date  . BREAST BIOPSY Right    No Scar seen   . COLONOSCOPY  multiple  . ESOPHAGOGASTRODUODENOSCOPY (EGD) WITH PROPOFOL N/A 11/23/2019   Procedure: ESOPHAGOGASTRODUODENOSCOPY (EGD) WITH PROPOFOL;  Surgeon: Daneil Dolin, MD;  Location: [facility];  Service: Endoscopy;  Laterality: N/A;  . GIVENS CAPSULE STUDY N/A 11/23/2019   Procedure: GIVENS CAPSULE STUDY;  Surgeon: Daneil Dolin, MD;  Location: [facility];  Service: Endoscopy;  Laterality: N/A;     SOCIAL HISTORY:  Social History   Socioeconomic History  . Marital status: Married    Spouse name: Not on file  . Number of children: 1  . Years of education: Not on file  . Highest education level: Not on file  Occupational History  . Occupation: retired  Tobacco Use  . Smoking status: Former Smoker    Quit date: 12/23/1984    Years since quitting: 35.6  . Smokeless tobacco: Never Used  Vaping Use  . Vaping Use: Never used  Substance and Sexual Activity  . Alcohol use: Yes    Comment: rare  . Drug use: No  . Sexual activity: Not on file  Other Topics Concern  . Not on  file  Social History Narrative   Patient is married and retired and has 1 grown child   Social Determinants of Radio broadcast assistant Strain: Not on file  Food Insecurity: Not on file  Transportation Needs: Not on file  Physical Activity: Not on file  Stress: Not on file  Social Connections: Not on file  Intimate Partner Violence: Not on file    FAMILY HISTORY:  Family History  Problem Relation Age of Onset  . Colon cancer Mother 22       80's  . Breast  cancer Sister   . Asthma Brother   . Colon polyps Neg Hx   . Esophageal cancer Neg Hx   . Rectal cancer Neg Hx   . Stomach cancer Neg Hx     CURRENT MEDICATIONS:  Outpatient Encounter Medications as of 08/02/2020  Medication Sig  . Ascorbic Acid (VITAMIN C) 1000 MG tablet Take 1,000 mg by mouth daily.  . bisoprolol (ZEBETA) 5 MG tablet Take 2.5 mg by mouth daily.  . Copper Gluconate (COPPER CAPS PO) Take 1 capsule by mouth daily.   . cyanocobalamin ([employer] VITAMIN B12) 2000 MCG tablet Take 1 tablet (2,000 mcg total) by mouth daily.  . ergocalciferol (VITAMIN D2) 1.25 MG (50000 UT) capsule Take 1 capsule (50,000 Units total) by mouth once a week.  . Magnesium 200 MG TABS Take 1 tablet (200 mg total) by mouth daily.  . Multiple Minerals-Vitamins (CALCIUM-MAGNESIUM-ZINC-D3 PO) Take 1 tablet by mouth daily.  . Multiple Vitamin (MULTIVITAMIN) capsule Take 1 capsule by mouth daily.   . nitrofurantoin (MACRODANTIN) 100 MG capsule Take 100 mg by mouth daily.  . rosuvastatin (CRESTOR) 10 MG tablet TAKE 1 TABLET BY MOUTH AT  BEDTIME  . vitamin E 400 UNIT capsule Take 400 Units by mouth daily.   . pantoprazole (PROTONIX) 40 MG tablet Take 1 tablet (40 mg total) by mouth daily.  . [DISCONTINUED] acetaminophen (TYLENOL) 325 MG tablet Take 2 tablets (650 mg total) by mouth every 6 (six) hours as needed for mild pain (or Fever >/= 101).  . [DISCONTINUED] ferrous sulfate 324 (65 Fe) MG TBEC Take 1 tablet (325 mg total) by mouth in the morning and at bedtime for 15 days. (Patient not taking: Reported on 01/25/2020)  . [DISCONTINUED] lisinopril (ZESTRIL) 5 MG tablet Take 1 tablet (5 mg total) by mouth daily.  . [DISCONTINUED] thiamine (VITAMIN B-1) 100 MG tablet Take 100 mg by mouth daily.    No facility-administered encounter medications on file as of 08/02/2020.    ALLERGIES:  Allergies  Allergen Reactions  . Sulfa Antibiotics Rash     PHYSICAL EXAM:  ECOG PERFORMANCE STATUS: 1 - Symptomatic but  completely ambulatory  Vitals:   08/02/20 1338  BP: 128/76  Pulse: 78  Resp: 18  Temp: (!) 97 F (36.1 C)  SpO2: 96%   Filed Weights   08/02/20 1338  Weight: 198 lb (89.8 kg)   Physical Exam Constitutional:      Appearance: Normal appearance.  HENT:     Head: Normocephalic and atraumatic.     Mouth/Throat:     Mouth: Mucous membranes are moist.  Eyes:     Extraocular Movements: Extraocular movements intact.     Pupils: Pupils are equal, round, and reactive to light.  Cardiovascular:     Rate and Rhythm: Normal rate and regular rhythm.     Pulses: Normal pulses.     Heart sounds: Murmur (systolic murmur, perviously known) heard.  Pulmonary:     Effort: Pulmonary effort is normal.     Breath sounds: Normal breath sounds.  Abdominal:     General: Bowel sounds are normal.     Palpations: Abdomen is soft.     Tenderness: There is no abdominal tenderness.  Musculoskeletal:        General: No swelling.     Right lower leg: No edema.     Left lower leg: No edema.  Lymphadenopathy:     Cervical: No cervical adenopathy.  Skin:    General: Skin is warm and dry.  Neurological:     General: No focal deficit present.     Mental Status: She is alert and oriented to person, place, and time.  Psychiatric:        Mood and Affect: Mood normal.        Behavior: Behavior normal.      LABORATORY DATA:  I have reviewed the labs as listed.  CBC    Component Value Date/Time   WBC 7.6 08/01/2020 1223   RBC 4.06 08/01/2020 1223   HGB 12.6 08/01/2020 1223   HGB 13.1 07/04/2020 0910   HCT 38.0 08/01/2020 1223   HCT 39.0 07/04/2020 0910   PLT 236 08/01/2020 1223   PLT 213 07/04/2020 0910   MCV 93.6 08/01/2020 1223   MCV 92 07/04/2020 0910   MCH 31.0 08/01/2020 1223   MCHC 33.2 08/01/2020 1223   RDW 12.7 08/01/2020 1223   RDW 12.6 07/04/2020 0910   LYMPHSABS 1.8 08/01/2020 1223   LYMPHSABS 1.6 07/04/2020 0910   MONOABS 0.9 08/01/2020 1223   EOSABS 0.2 08/01/2020 1223    EOSABS 0.3 07/04/2020 0910   BASOSABS 0.1 08/01/2020 1223   BASOSABS 0.1 07/04/2020 0910   CMP Latest Ref Rng & Units 08/01/2020 07/04/2020 12/27/2019  Glucose 70 - 99 mg/dL 102(H) 99 105(H)  BUN 8 - 23 mg/dL 11 10 10   Creatinine 0.44 - 1.00 mg/dL 0.63 0.72 0.61  Sodium 135 - 145 mmol/L 135 140 135  Potassium 3.5 - 5.1 mmol/L 3.5 4.4 3.7  Chloride 98 - 111 mmol/L 102 103 101  CO2 22 - 32 mmol/L 28 22 25   Calcium 8.9 - 10.3 mg/dL 9.0 9.2 8.9  Total Protein 6.5 - 8.1 g/dL 6.7 6.3 5.9(L)  Total Bilirubin 0.3 - 1.2 mg/dL 0.8 0.5 0.5  Alkaline Phos 38 - 126 U/L 39 45 36(L)  AST 15 - 41 U/L 40 50(H) 26  ALT 0 - 44 U/L 40 57(H) 18    DIAGNOSTIC IMAGING:  I have independently reviewed the relevant imaging and discussed with the patient.  ASSESSMENT: 1. Severe iron deficiency anemia, improved -Admitted on 03/24/2019 with hemoglobin 5.3 and ferritin of 6 and B12 of 145. Status post 3 units PRBC and 1 infusion of Feraheme on 03/24/2019. -Colonoscopy on 11/17/2018 shows 2 diminutive polyps in the transverse colon, diverticulosis in the sigmoid colon and descending colon transverse colon. -EGD shows duodenal erosions. -Multiple stool occult blood was positive. -Etiology of anemia is blood loss. No improvement despite taking iron tablet twice daily. -Last Feraheme on 04/29/2019 and 05/06/2019 - Most recent labs (08/01/2020) reviewed with patient: Hgb normal at 12.6, but with iron deficiency noted by iron saturation 14% and ferritin 87  2. B12 deficiency: -History of low B12 levels and on oral B12 therapy.   PLAN:  1.  Iron deficiency with history of severe iron deficient anemia - Most recent labs (08/01/2020) reviewed with patient: Hgb normal at 12.6, but  with iron deficiency noted by iron saturation 14% and ferritin 87 - Recommend IV Feraheme x2 - RTC in 3 months for repeat labs and follow-up visit  2.  B12 deficiency - Continue B12 tablet daily - RTC in 3 months for repeat labs and  follow-up visit   PLAN SUMMARY & DISPOSITION: - Labs and RTC in 3 months - IV Feraheme x2  All questions were answered. The patient knows to call the clinic with any problems, questions or concerns.  Medical decision making: Low  Time spent on visit: I spent 10 minutes counseling the patient face to face. The total time spent in the appointment was 20 minutes and more than 50% was on counseling.   Harriett Rush, PA-C  08/02/20 2:06 PM

## 2020-08-02 NOTE — Patient Instructions (Signed)
[place] at [facility] Discharge Instructions  You were seen today by Tarri Abernethy PA-C for your anemia.  You blood count (hemoglobin) looks great, but your iron levels (ferritin) are on the low side.  This is most likely what is causing your significant fatigue.  We will schedule you for IV iron and see you back in 3 months for repeat labs and follow-up visit.    LABS: Return in 3 months for labs   OTHER TESTS: None  MEDICATIONS: IV iron x 2  FOLLOW-UP APPOINTMENT: 3 months   Thank you for choosing [place] at [facility] to provide your oncology and hematology care.  To afford each patient quality time with our provider, please arrive at least 15 minutes before your scheduled appointment time.   If you have a lab appointment with the [facility] please come in thru the Main Entrance and check in at the main information desk.  You need to re-schedule your appointment should you arrive 10 or more minutes late.  We strive to give you quality time with our providers, and arriving late affects you and other patients whose appointments are after yours.  Also, if you no show three or more times for appointments you may be dismissed from the clinic at the providers discretion.     Again, thank you for choosing [facility].  Our hope is that these requests will decrease the amount of time that you wait before being seen by our physicians.       _____________________________________________________________  Should you have questions after your visit to [facility], please contact our office at [phone] and follow the prompts.  Our office hours are 8:00 a.m. and 4:30 p.m. Monday - Friday.  Please note that voicemails left after 4:00 p.m. may not be returned until the following business day.  We are closed weekends and major holidays.  You do have access to a nurse 24-7, just call the main number to the clinic  [phone] and do not press any options, hold on the line and a nurse will answer the phone.    For prescription refill requests, have your pharmacy contact our office and allow 72 hours.    Due to Covid, you will need to wear a mask upon entering the hospital. If you do not have a mask, a mask will be given to you at the Main Entrance upon arrival. For doctor visits, patients may have 1 support person age 64 or older with them. For treatment visits, patients can not have anyone with them due to social distancing guidelines and our immunocompromised population.

## 2020-08-04 ENCOUNTER — Other Ambulatory Visit: Payer: Self-pay

## 2020-08-04 ENCOUNTER — Inpatient Hospital Stay (HOSPITAL_COMMUNITY): Payer: Medicare Other

## 2020-08-04 VITALS — BP 146/76 | HR 77 | Temp 98.2°F | Resp 18

## 2020-08-04 DIAGNOSIS — D509 Iron deficiency anemia, unspecified: Secondary | ICD-10-CM | POA: Diagnosis not present

## 2020-08-04 MED ORDER — LORATADINE 10 MG PO TABS
10.0000 mg | ORAL_TABLET | Freq: Once | ORAL | Status: AC
  Administered 2020-08-04: 10 mg via ORAL

## 2020-08-04 MED ORDER — SODIUM CHLORIDE 0.9 % IV SOLN: Freq: Once | INTRAVENOUS | Status: AC

## 2020-08-04 MED ORDER — ACETAMINOPHEN 325 MG PO TABS
650.0000 mg | ORAL_TABLET | Freq: Once | ORAL | Status: AC
Start: 2020-08-04 — End: 2020-08-04
  Administered 2020-08-04: 650 mg via ORAL

## 2020-08-04 MED ORDER — LORATADINE 10 MG PO TABS
ORAL_TABLET | ORAL | Status: AC
  Filled 2020-08-04: qty 1

## 2020-08-04 MED ORDER — ACETAMINOPHEN 325 MG PO TABS
ORAL_TABLET | ORAL | Status: AC
  Filled 2020-08-04: qty 2

## 2020-08-04 MED ORDER — SODIUM CHLORIDE 0.9 % IV SOLN
510.0000 mg | Freq: Once | INTRAVENOUS | Status: AC
  Administered 2020-08-04: 510 mg via INTRAVENOUS
  Filled 2020-08-04: qty 17

## 2020-08-04 NOTE — Progress Notes (Signed)
Iron infusion given per orders. Patient tolerated it well without problems. Vitals stable and discharged home from clinic ambulatory. Follow up as scheduled.  

## 2020-08-04 NOTE — Patient Instructions (Signed)
[facility]  Discharge Instructions: Thank you for choosing [facility] to provide your oncology and hematology care.  If you have a lab appointment with the [facility], please come in thru the Main Entrance and check in at the main information desk.  Wear comfortable clothing and clothing appropriate for easy access to any Portacath or PICC line.   We strive to give you quality time with your provider. You may need to reschedule your appointment if you arrive late (15 or more minutes).  Arriving late affects you and other patients whose appointments are after yours.  Also, if you miss three or more appointments without notifying the office, you may be dismissed from the clinic at the provider's discretion.      For prescription refill requests, have your pharmacy contact our office and allow 72 hours for refills to be completed.       To help prevent nausea and vomiting after your treatment, we encourage you to take your nausea medication as directed.   Items with * indicate a potential emergency and should be followed up as soon as possible or go to the Emergency Department if any problems should occur.    Should you have questions after your visit or need to cancel or reschedule your appointment, please contact [facility] [phone]  and follow the prompts.  Office hours are 8:00 a.m. to 4:30 p.m. Monday - Friday. Please note that voicemails left after 4:00 p.m. may not be returned until the following business day.  We are closed weekends and major holidays. You have access to a nurse at all times for urgent questions. Please call the main number to the clinic [phone] and follow the prompts.  For any non-urgent questions, you may also contact your provider using MyChart. We now offer e-Visits for anyone 81 and older to request care online for non-urgent symptoms. For details visit [email or web address].   Also download the MyChart app! Go  to the app store, search "MyChart", open the app, select [facility], and log in with your MyChart username and password.  Due to Covid, a mask is required upon entering the hospital/clinic. If you do not have a mask, one will be given to you upon arrival. For doctor visits, patients may have 1 support person aged 59 or older with them. For treatment visits, patients cannot have anyone with them due to current Covid guidelines and our immunocompromised population.

## 2020-08-11 ENCOUNTER — Encounter (HOSPITAL_COMMUNITY): Payer: Self-pay

## 2020-08-11 ENCOUNTER — Inpatient Hospital Stay (HOSPITAL_COMMUNITY): Payer: Medicare Other

## 2020-08-11 ENCOUNTER — Other Ambulatory Visit: Payer: Self-pay

## 2020-08-11 VITALS — BP 134/64 | HR 61 | Temp 97.6°F | Resp 18

## 2020-08-11 DIAGNOSIS — D509 Iron deficiency anemia, unspecified: Secondary | ICD-10-CM

## 2020-08-11 MED ORDER — SODIUM CHLORIDE 0.9 % IV SOLN: Freq: Once | INTRAVENOUS | Status: AC

## 2020-08-11 MED ORDER — LORATADINE 10 MG PO TABS: 10.0000 mg | ORAL_TABLET | Freq: Once | ORAL | Status: DC

## 2020-08-11 MED ORDER — ACETAMINOPHEN 325 MG PO TABS: 650.0000 mg | ORAL_TABLET | Freq: Once | ORAL | Status: DC

## 2020-08-11 MED ORDER — FERUMOXYTOL INJECTION 510 MG/17 ML
510.0000 mg | Freq: Once | INTRAVENOUS | Status: AC
  Administered 2020-08-11: 510 mg via INTRAVENOUS
  Filled 2020-08-11: qty 510

## 2020-08-11 NOTE — Patient Instructions (Signed)
[facility]  Discharge Instructions: Thank you for choosing [facility] to provide your oncology and hematology care.  If you have a lab appointment with the Cancer Center, please come in thru the Main Entrance and check in at the main information desk.  Wear comfortable clothing and clothing appropriate for easy access to any Portacath or PICC line.   We strive to give you quality time with your provider. You may need to reschedule your appointment if you arrive late (15 or more minutes).  Arriving late affects you and other patients whose appointments are after yours.  Also, if you miss three or more appointments without notifying the office, you may be dismissed from the clinic at the provider's discretion.      For prescription refill requests, have your pharmacy contact our office and allow 72 hours for refills to be completed.    Today you received the following Feraheme infusion      To help prevent nausea and vomiting after your treatment, we encourage you to take your nausea medication as directed.  BELOW ARE SYMPTOMS THAT SHOULD BE REPORTED IMMEDIATELY: *FEVER GREATER THAN 100.4 F (38 C) OR HIGHER *CHILLS OR SWEATING *NAUSEA AND VOMITING THAT IS NOT CONTROLLED WITH YOUR NAUSEA MEDICATION *UNUSUAL SHORTNESS OF BREATH *UNUSUAL BRUISING OR BLEEDING *URINARY PROBLEMS (pain or burning when urinating, or frequent urination) *BOWEL PROBLEMS (unusual diarrhea, constipation, pain near the anus) TENDERNESS IN MOUTH AND THROAT WITH OR WITHOUT PRESENCE OF ULCERS (sore throat, sores in mouth, or a toothache) UNUSUAL RASH, SWELLING OR PAIN  UNUSUAL VAGINAL DISCHARGE OR ITCHING   Items with * indicate a potential emergency and should be followed up as soon as possible or go to the Emergency Department if any problems should occur.  Please show the CHEMOTHERAPY ALERT CARD or IMMUNOTHERAPY ALERT CARD at check-in to the Emergency Department and triage nurse.  Should  you have questions after your visit or need to cancel or reschedule your appointment, please contact [facility] [phone]  and follow the prompts.  Office hours are 8:00 a.m. to 4:30 p.m. Monday - Friday. Please note that voicemails left after 4:00 p.m. may not be returned until the following business day.  We are closed weekends and major holidays. You have access to a nurse at all times for urgent questions. Please call the main number to the clinic [phone] and follow the prompts.  For any non-urgent questions, you may also contact your provider using MyChart. We now offer e-Visits for anyone 18 and older to request care online for non-urgent symptoms. For details visit [email or web address].   Also download the MyChart app! Go to the app store, search "MyChart", open the app, select [facility], and log in with your MyChart username and password.  Due to Covid, a mask is required upon entering the hospital/clinic. If you do not have a mask, one will be given to you upon arrival. For doctor visits, patients may have 1 support person aged 18 or older with them. For treatment visits, patients cannot have anyone with them due to current Covid guidelines and our immunocompromised population.  

## 2020-08-11 NOTE — Progress Notes (Signed)
Patient presents today for Feraheme infusion. Vital signs stable. Patient has no complaints today of any changes since her last visit. Patient took her pre-medications at 0830 am . Tylenol 650 mg PO and Claritin 10 mg PO. MAR reviewed and updated.   Feraheme given today per MD orders. Tolerated infusion without adverse affects. Vital signs stable. No complaints at this time. Discharged from clinic ambulatory in stable condition. Alert and oriented x 3. F/U with [facility] as scheduled.

## 2020-08-18 ENCOUNTER — Other Ambulatory Visit: Payer: Self-pay | Admitting: Family Medicine

## 2020-08-29 DIAGNOSIS — L300 Nummular dermatitis: Secondary | ICD-10-CM | POA: Diagnosis not present

## 2020-09-05 ENCOUNTER — Other Ambulatory Visit: Payer: Self-pay

## 2020-09-05 ENCOUNTER — Ambulatory Visit: Payer: Medicare Other | Admitting: Genetic Counselor

## 2020-09-07 NOTE — Progress Notes (Signed)
Post-test Genetic Consultation   Stacey Reyes is here today for her post-test genetic consult. She notes no changes other than eczema, to her medical status since we last met. We then reviewed her pedigree and she affirmed that there have been no changes to her family's medical history. Tells me that both her grandkids have a son and daughter each. While she does not have the traditional risk factors for HCM or family history of sudden death or HCM, she does present with increased severity (IVS:2.5 cm) and outflow tract obstruction.  I informed her that she does not have a pathogenic variant for HCM. I explained the reasons as to why her genetic test was negative. Clinical testing is restricted to the coding regions of the gene thus precluding the regulatory control elements that may impact gene function and hence protein activity.  I explained to her that if a mutation is not identified, then all first-degree relatives should regular screening for HCM. I emphasized that even if the genetic test is negative, it does not mean that she does not have HCM. It is recommended that her first-degree relatives, namely her son and 2 living sisters seek regular echocardiogram and EKG screening for HCM. She is agreeable with this plan of action   Lattie Corns, Ph.D, [facility] Clinical Molecular Geneticist

## 2020-09-13 ENCOUNTER — Telehealth (HOSPITAL_COMMUNITY): Payer: Self-pay | Admitting: Hematology

## 2020-09-13 NOTE — Telephone Encounter (Signed)
This pt called in this morning, questioning a balance of $571.48. She said she was told back in April that it was billed incorrectly. Per Tessa A charges were not denied, they are the pts coins and copay.

## 2020-09-20 ENCOUNTER — Telehealth (HOSPITAL_COMMUNITY): Payer: Self-pay | Admitting: Hematology

## 2020-09-20 NOTE — Telephone Encounter (Signed)
Returned pts call regarding a bill she received for $5714.48. Advised her that I spoke to the billing dept and the bill was coded correctly and she does owe the balance.

## 2020-09-21 DIAGNOSIS — D485 Neoplasm of uncertain behavior of skin: Secondary | ICD-10-CM | POA: Diagnosis not present

## 2020-09-21 DIAGNOSIS — D235 Other benign neoplasm of skin of trunk: Secondary | ICD-10-CM | POA: Diagnosis not present

## 2020-09-21 DIAGNOSIS — L905 Scar conditions and fibrosis of skin: Secondary | ICD-10-CM | POA: Diagnosis not present

## 2020-09-21 DIAGNOSIS — L821 Other seborrheic keratosis: Secondary | ICD-10-CM | POA: Diagnosis not present

## 2020-09-21 DIAGNOSIS — L300 Nummular dermatitis: Secondary | ICD-10-CM | POA: Diagnosis not present

## 2020-09-21 DIAGNOSIS — Z85828 Personal history of other malignant neoplasm of skin: Secondary | ICD-10-CM | POA: Diagnosis not present

## 2020-09-21 DIAGNOSIS — D229 Melanocytic nevi, unspecified: Secondary | ICD-10-CM | POA: Diagnosis not present

## 2020-09-22 ENCOUNTER — Other Ambulatory Visit (HOSPITAL_COMMUNITY): Payer: Self-pay

## 2020-09-22 DIAGNOSIS — E559 Vitamin D deficiency, unspecified: Secondary | ICD-10-CM

## 2020-09-22 MED ORDER — ERGOCALCIFEROL 1.25 MG (50000 UT) PO CAPS: 50000.0000 [IU] | ORAL_CAPSULE | ORAL | 3 refills | Status: DC

## 2020-09-26 ENCOUNTER — Other Ambulatory Visit: Payer: Self-pay | Admitting: Family Medicine

## 2020-09-27 ENCOUNTER — Other Ambulatory Visit (HOSPITAL_COMMUNITY): Payer: Self-pay | Admitting: *Deleted

## 2020-09-27 DIAGNOSIS — E559 Vitamin D deficiency, unspecified: Secondary | ICD-10-CM

## 2020-09-27 MED ORDER — ERGOCALCIFEROL 1.25 MG (50000 UT) PO CAPS: 50000.0000 [IU] | ORAL_CAPSULE | ORAL | 3 refills | Status: DC

## 2020-10-18 ENCOUNTER — Telehealth: Payer: Self-pay | Admitting: Family Medicine

## 2020-10-18 ENCOUNTER — Other Ambulatory Visit: Payer: Self-pay

## 2020-10-18 ENCOUNTER — Ambulatory Visit: Payer: Medicare Other

## 2020-10-18 DIAGNOSIS — D508 Other iron deficiency anemias: Secondary | ICD-10-CM | POA: Diagnosis not present

## 2020-10-18 LAB — HEMOGLOBIN, FINGERSTICK: Hemoglobin: 11.1 g/dL (ref 11.1–15.9)

## 2020-10-18 NOTE — Telephone Encounter (Signed)
Patient aware and verbalized understanding. °

## 2020-10-18 NOTE — Telephone Encounter (Signed)
Per Dr. Synthia Innocent last office note pt may come in prn to have HgB A1C checked due to anemia. Per note it is a standing order.

## 2020-10-19 ENCOUNTER — Inpatient Hospital Stay (HOSPITAL_COMMUNITY)
Admission: EM | Admit: 2020-10-19 | Discharge: 2020-10-22 | DRG: 378 | Disposition: A | Discharge status: Home / Self Care | Payer: Medicare Other | Attending: Family Medicine | Admitting: Family Medicine

## 2020-10-19 ENCOUNTER — Encounter (HOSPITAL_COMMUNITY): Payer: Self-pay | Admitting: *Deleted

## 2020-10-19 ENCOUNTER — Ambulatory Visit (INDEPENDENT_AMBULATORY_CARE_PROVIDER_SITE_OTHER): Payer: Medicare Other

## 2020-10-19 ENCOUNTER — Emergency Department (HOSPITAL_COMMUNITY): Payer: Medicare Other

## 2020-10-19 ENCOUNTER — Encounter: Payer: Self-pay | Admitting: Family Medicine

## 2020-10-19 ENCOUNTER — Other Ambulatory Visit: Payer: Self-pay

## 2020-10-19 ENCOUNTER — Ambulatory Visit (INDEPENDENT_AMBULATORY_CARE_PROVIDER_SITE_OTHER): Payer: Medicare Other | Admitting: Family Medicine

## 2020-10-19 VITALS — BP 91/58 | HR 85 | Temp 97.4°F | Ht 61.0 in | Wt 200.0 lb

## 2020-10-19 DIAGNOSIS — R0600 Dyspnea, unspecified: Secondary | ICD-10-CM

## 2020-10-19 DIAGNOSIS — I517 Cardiomegaly: Secondary | ICD-10-CM | POA: Diagnosis not present

## 2020-10-19 DIAGNOSIS — I422 Other hypertrophic cardiomyopathy: Secondary | ICD-10-CM | POA: Diagnosis present

## 2020-10-19 DIAGNOSIS — Z86010 Personal history of colonic polyps: Secondary | ICD-10-CM

## 2020-10-19 DIAGNOSIS — K921 Melena: Secondary | ICD-10-CM

## 2020-10-19 DIAGNOSIS — K5520 Angiodysplasia of colon without hemorrhage: Secondary | ICD-10-CM | POA: Diagnosis present

## 2020-10-19 DIAGNOSIS — D649 Anemia, unspecified: Secondary | ICD-10-CM | POA: Diagnosis present

## 2020-10-19 DIAGNOSIS — R6889 Other general symptoms and signs: Secondary | ICD-10-CM | POA: Diagnosis not present

## 2020-10-19 DIAGNOSIS — I959 Hypotension, unspecified: Secondary | ICD-10-CM

## 2020-10-19 DIAGNOSIS — R0609 Other forms of dyspnea: Secondary | ICD-10-CM

## 2020-10-19 DIAGNOSIS — Z20822 Contact with and (suspected) exposure to covid-19: Secondary | ICD-10-CM | POA: Diagnosis present

## 2020-10-19 DIAGNOSIS — D508 Other iron deficiency anemias: Secondary | ICD-10-CM

## 2020-10-19 DIAGNOSIS — E785 Hyperlipidemia, unspecified: Secondary | ICD-10-CM | POA: Diagnosis present

## 2020-10-19 DIAGNOSIS — D62 Acute posthemorrhagic anemia: Secondary | ICD-10-CM | POA: Diagnosis present

## 2020-10-19 DIAGNOSIS — Z882 Allergy status to sulfonamides status: Secondary | ICD-10-CM

## 2020-10-19 DIAGNOSIS — R072 Precordial pain: Secondary | ICD-10-CM | POA: Diagnosis present

## 2020-10-19 DIAGNOSIS — R10A2 Flank pain, left side: Secondary | ICD-10-CM

## 2020-10-19 DIAGNOSIS — K922 Gastrointestinal hemorrhage, unspecified: Secondary | ICD-10-CM

## 2020-10-19 DIAGNOSIS — R109 Unspecified abdominal pain: Secondary | ICD-10-CM

## 2020-10-19 DIAGNOSIS — Z66 Do not resuscitate: Secondary | ICD-10-CM | POA: Diagnosis present

## 2020-10-19 DIAGNOSIS — E782 Mixed hyperlipidemia: Secondary | ICD-10-CM | POA: Diagnosis present

## 2020-10-19 DIAGNOSIS — R0789 Other chest pain: Secondary | ICD-10-CM | POA: Diagnosis not present

## 2020-10-19 DIAGNOSIS — I340 Nonrheumatic mitral (valve) insufficiency: Secondary | ICD-10-CM | POA: Diagnosis not present

## 2020-10-19 DIAGNOSIS — Z87891 Personal history of nicotine dependence: Secondary | ICD-10-CM | POA: Diagnosis not present

## 2020-10-19 DIAGNOSIS — R778 Other specified abnormalities of plasma proteins: Secondary | ICD-10-CM | POA: Diagnosis not present

## 2020-10-19 DIAGNOSIS — I10 Essential (primary) hypertension: Secondary | ICD-10-CM | POA: Diagnosis present

## 2020-10-19 DIAGNOSIS — R7989 Other specified abnormal findings of blood chemistry: Secondary | ICD-10-CM

## 2020-10-19 DIAGNOSIS — R0602 Shortness of breath: Secondary | ICD-10-CM | POA: Diagnosis not present

## 2020-10-19 DIAGNOSIS — Z79899 Other long term (current) drug therapy: Secondary | ICD-10-CM | POA: Diagnosis not present

## 2020-10-19 LAB — CBC WITH DIFFERENTIAL/PLATELET
Abs Immature Granulocytes: 0.02 10*3/uL (ref 0.00–0.07)
Basophils Absolute: 0.1 10*3/uL (ref 0.0–0.1)
Basophils Relative: 1 %
Eosinophils Absolute: 0.3 10*3/uL (ref 0.0–0.5)
Eosinophils Relative: 4 %
HCT: 27.7 % — ABNORMAL LOW (ref 36.0–46.0)
Hemoglobin: 9.1 g/dL — ABNORMAL LOW (ref 12.0–15.0)
Immature Granulocytes: 0 %
Lymphocytes Relative: 33 %
Lymphs Abs: 2.2 10*3/uL (ref 0.7–4.0)
MCH: 31.5 pg (ref 26.0–34.0)
MCHC: 32.9 g/dL (ref 30.0–36.0)
MCV: 95.8 fL (ref 80.0–100.0)
Monocytes Absolute: 0.7 10*3/uL (ref 0.1–1.0)
Monocytes Relative: 11 %
Neutro Abs: 3.4 10*3/uL (ref 1.7–7.7)
Neutrophils Relative %: 51 %
Platelets: 160 10*3/uL (ref 150–400)
RBC: 2.89 MIL/uL — ABNORMAL LOW (ref 3.87–5.11)
RDW: 13.3 % (ref 11.5–15.5)
WBC: 6.6 10*3/uL (ref 4.0–10.5)
nRBC: 0 % (ref 0.0–0.2)

## 2020-10-19 LAB — CMP14+EGFR
ALT: 30 IU/L (ref 0–32)
AST: 34 IU/L (ref 0–40)
Albumin/Globulin Ratio: 2.4 — ABNORMAL HIGH (ref 1.2–2.2)
Albumin: 3.9 g/dL (ref 3.7–4.7)
Alkaline Phosphatase: 33 IU/L — ABNORMAL LOW (ref 44–121)
BUN/Creatinine Ratio: 27 (ref 12–28)
BUN: 16 mg/dL (ref 8–27)
Bilirubin Total: 0.2 mg/dL (ref 0.0–1.2)
CO2: 24 mmol/L (ref 20–29)
Calcium: 8.8 mg/dL (ref 8.7–10.3)
Chloride: 103 mmol/L (ref 96–106)
Creatinine, Ser: 0.59 mg/dL (ref 0.57–1.00)
Globulin, Total: 1.6 g/dL (ref 1.5–4.5)
Glucose: 129 mg/dL — ABNORMAL HIGH (ref 65–99)
Potassium: 4.2 mmol/L (ref 3.5–5.2)
Sodium: 139 mmol/L (ref 134–144)
Total Protein: 5.5 g/dL — ABNORMAL LOW (ref 6.0–8.5)
eGFR: 94 mL/min/{1.73_m2} (ref >59)

## 2020-10-19 LAB — ANEMIA PROFILE B
Basophils Absolute: 0.1 10*3/uL (ref 0.0–0.2)
Basos: 1 %
EOS (ABSOLUTE): 0.3 10*3/uL (ref 0.0–0.4)
Eos: 4 %
Ferritin: 304 ng/mL — ABNORMAL HIGH (ref 15–150)
Folate: 12.9 ng/mL (ref >3.0)
Hematocrit: 28.6 % — ABNORMAL LOW (ref 34.0–46.6)
Hemoglobin: 9.7 g/dL — ABNORMAL LOW (ref 11.1–15.9)
Immature Grans (Abs): 0 10*3/uL (ref 0.0–0.1)
Immature Granulocytes: 0 %
Iron Saturation: 31 % (ref 15–55)
Iron: 82 ug/dL (ref 27–139)
Lymphocytes Absolute: 2 10*3/uL (ref 0.7–3.1)
Lymphs: 28 %
MCH: 31.3 pg (ref 26.6–33.0)
MCHC: 33.9 g/dL (ref 31.5–35.7)
MCV: 92 fL (ref 79–97)
Monocytes Absolute: 0.6 10*3/uL (ref 0.1–0.9)
Monocytes: 9 %
Neutrophils Absolute: 4.1 10*3/uL (ref 1.4–7.0)
Neutrophils: 58 %
Platelets: 167 10*3/uL (ref 150–450)
RBC: 3.1 x10E6/uL — ABNORMAL LOW (ref 3.77–5.28)
RDW: 12.9 % (ref 11.7–15.4)
Retic Ct Pct: 2.7 % — ABNORMAL HIGH (ref 0.6–2.6)
Total Iron Binding Capacity: 265 ug/dL (ref 250–450)
UIBC: 183 ug/dL (ref 118–369)
Vitamin B-12: 920 pg/mL (ref 232–1245)
WBC: 7.1 10*3/uL (ref 3.4–10.8)

## 2020-10-19 LAB — URINALYSIS, ROUTINE W REFLEX MICROSCOPIC
Bilirubin, UA: NEGATIVE
Glucose, UA: NEGATIVE
Ketones, UA: NEGATIVE
Leukocytes,UA: NEGATIVE
Nitrite, UA: NEGATIVE
Protein,UA: NEGATIVE
RBC, UA: NEGATIVE
Specific Gravity, UA: 1.015 (ref 1.005–1.030)
Urobilinogen, Ur: 0.2 mg/dL (ref 0.2–1.0)
pH, UA: 6 (ref 5.0–7.5)

## 2020-10-19 LAB — COMPREHENSIVE METABOLIC PANEL
ALT: 33 U/L (ref 0–44)
AST: 35 U/L (ref 15–41)
Albumin: 3.6 g/dL (ref 3.5–5.0)
Alkaline Phosphatase: 28 U/L — ABNORMAL LOW (ref 38–126)
Anion gap: 5 (ref 5–15)
BUN: 19 mg/dL (ref 8–23)
CO2: 26 mmol/L (ref 22–32)
Calcium: 8.4 mg/dL — ABNORMAL LOW (ref 8.9–10.3)
Chloride: 104 mmol/L (ref 98–111)
Creatinine, Ser: 0.54 mg/dL (ref 0.44–1.00)
GFR, Estimated: >60 mL/min (ref >60)
Glucose, Bld: 106 mg/dL — ABNORMAL HIGH (ref 70–99)
Potassium: 3.7 mmol/L (ref 3.5–5.1)
Sodium: 135 mmol/L (ref 135–145)
Total Bilirubin: 0.4 mg/dL (ref 0.3–1.2)
Total Protein: 5.7 g/dL — ABNORMAL LOW (ref 6.5–8.1)

## 2020-10-19 LAB — RESP PANEL BY RT-PCR (FLU A&B, COVID) ARPGX2
Influenza A by PCR: NEGATIVE
Influenza B by PCR: NEGATIVE
SARS Coronavirus 2 by RT PCR: NEGATIVE

## 2020-10-19 LAB — PRO B NATRIURETIC PEPTIDE: NT-Pro BNP: 2974 pg/mL — ABNORMAL HIGH (ref 0–738)

## 2020-10-19 LAB — TYPE AND SCREEN
ABO/RH(D): O POS
Antibody Screen: NEGATIVE

## 2020-10-19 LAB — LIPASE, BLOOD: Lipase: 35 U/L (ref 11–51)

## 2020-10-19 LAB — TROPONIN I (HIGH SENSITIVITY)
Troponin I (High Sensitivity): 159 ng/L (ref <18)
Troponin I (High Sensitivity): 175 ng/L (ref <18)

## 2020-10-19 LAB — PROTIME-INR
INR: 1 (ref 0.8–1.2)
Prothrombin Time: 13.4 seconds (ref 11.4–15.2)

## 2020-10-19 LAB — POC OCCULT BLOOD, ED: Fecal Occult Bld: POSITIVE — AB

## 2020-10-19 MED ORDER — SODIUM CHLORIDE 0.9 % IV BOLUS
500.0000 mL | Freq: Once | INTRAVENOUS | Status: AC
  Administered 2020-10-19: 500 mL via INTRAVENOUS

## 2020-10-19 MED ORDER — PANTOPRAZOLE 80MG IVPB - SIMPLE MED
80.0000 mg | Freq: Once | INTRAVENOUS | Status: AC
  Administered 2020-10-19: 80 mg via INTRAVENOUS
  Filled 2020-10-19: qty 100

## 2020-10-19 NOTE — ED Notes (Signed)
Dr Abbey Chatters paged to Dr Long @ [phone]

## 2020-10-19 NOTE — ED Provider Notes (Signed)
Emergency Department Provider Note   I have reviewed the triage vital signs and the nursing notes.   HISTORY  Chief Complaint Shortness of Breath   HPI Stacey Reyes is a 75 y.o. female with PMH reviewed below including GI bleeding and symptomatic anemia requiring iron transfusions and PRBC presents to the ED with weakness and exertional dyspnea. Symptoms worsening over the last week. She began noticing dark, tarry stools around that same time. Notes history of the same in the past. When symptoms did not improve she saw her PCP yesterday where labs were drawn. She continues to feel SOB with exertion and occasionally some associated CP but no pain at rest. Describes pain as a "tightness" when SOB symptoms are at their peak. Had repeat labs today at the PCP office after patient reported some hypotension episodes at home. Labs showed a 2g drop in Hb and patient was referred to the ED.    Past Medical History:  Diagnosis Date   Anemia    Cataract    removed bilateral   Chronic cystitis    Diverticulitis    Heart murmur    Hyperlipidemia    Hypertension    Personal history of colonic polyps-adenomas 01/07/2012   2009 - 2 diminutive adenomas (prior polyps also) 01/07/2012 - 2 diminutive adenomas      Patient Active Problem List   Diagnosis Date Noted   Morning headache 03/08/2020   Hypertrophic cardiomyopathy ([place]) 01/17/2020   Iron deficiency anemia 12/21/2019   Need for immunization against influenza 12/14/2019   Acute blood loss anemia 11/22/2019   Educated about COVID-19 virus infection 07/05/2019   SOB (shortness of breath) 07/05/2019   B12 deficiency 03/25/2019   Absolute anemia 03/24/2019   Osteopenia after menopause 04/22/2018   Cardiac murmur due to mitral valve disorder 03/03/2018   Heart murmur 03/03/2018   LVH (left ventricular hypertrophy) 11/20/2015   Arrhythmia 10/11/2015   Hypertension, essential 02/24/2015   Hyperlipidemia 02/24/2015   Personal  history of colonic polyps-adenomas 01/07/2012    Past Surgical History:  Procedure Laterality Date   BREAST BIOPSY Right    No Scar seen    COLONOSCOPY  multiple   ESOPHAGOGASTRODUODENOSCOPY (EGD) WITH PROPOFOL N/A 11/23/2019   Procedure: ESOPHAGOGASTRODUODENOSCOPY (EGD) WITH PROPOFOL;  Surgeon: Daneil Dolin, MD;  [facility];  Service: Endoscopy;  Laterality: N/A;   GIVENS CAPSULE STUDY N/A 11/23/2019   Procedure: GIVENS CAPSULE STUDY;  Surgeon: Daneil Dolin, MD;  Location: [facility];  Service: Endoscopy;  Laterality: N/A;    Allergies Sulfa antibiotics  Family History  Problem Relation Age of Onset   Colon cancer Mother 15       80's   Breast cancer Sister    Asthma Brother    Colon polyps Neg Hx    Esophageal cancer Neg Hx    Rectal cancer Neg Hx    Stomach cancer Neg Hx     Social History Social History   Tobacco Use   Smoking status: Former    Types: Cigarettes    Quit date: 12/23/1984    Years since quitting: 35.8   Smokeless tobacco: Never  Vaping Use   Vaping Use: Never used  Substance Use Topics   Alcohol use: Yes    Comment: rare   Drug use: No    Review of Systems  Constitutional: No fever/chills Eyes: No visual changes. ENT: No sore throat. Cardiovascular: Chest tightness with ambulation and SOB.  Respiratory: Positive shortness of breath  on exertion.  Gastrointestinal: No abdominal pain.  No nausea, no vomiting.  No diarrhea.  No constipation. Positive black stools.  Genitourinary: Negative for dysuria. Musculoskeletal: Negative for back pain. Skin: Negative for rash. Neurological: Negative for headaches, focal weakness or numbness.  10-point ROS otherwise negative.  ____________________________________________   PHYSICAL EXAM:  VITAL SIGNS: ED Triage Vitals  Enc Vitals Group     BP 10/19/20 1818 114/69     Pulse Rate 10/19/20 1818 91     Resp 10/19/20 1818 (!) 22     Temp 10/19/20 1818 98.5 F (36.9 C)     Temp  Source 10/19/20 1818 Oral     SpO2 10/19/20 1818 99 %     Weight 10/19/20 1844 198 lb 6.6 oz (90 kg)     Height 10/19/20 1844 '5\' 1"'$  (1.549 m)   Constitutional: Alert and oriented. Well appearing and in no acute distress. Eyes: Conjunctivae are normal.  Head: Atraumatic. Nose: No congestion/rhinnorhea. Mouth/Throat: Mucous membranes are moist.   Neck: No stridor.   Cardiovascular: Normal rate, regular rhythm. Good peripheral circulation. Grossly normal heart sounds.   Respiratory: Normal respiratory effort.  No retractions. Lungs CTAB. Gastrointestinal: Soft and nontender. No distention. Rectal exam performed with nurse chaperone and patient's verbal consent. Melena on exam. Occult blood positive.  Musculoskeletal: No lower extremity tenderness nor edema. No gross deformities of extremities. Neurologic:  Normal speech and language. No gross focal neurologic deficits are appreciated.  Skin:  Skin is warm, dry and intact. No rash noted.  ____________________________________________   LABS (all labs ordered are listed, but only abnormal results are displayed)  Labs Reviewed  COMPREHENSIVE METABOLIC PANEL - Abnormal; Notable for the following components:      Result Value   Glucose, Bld 106 (*)    Calcium 8.4 (*)    Total Protein 5.7 (*)    Alkaline Phosphatase 28 (*)    All other components within normal limits  CBC WITH DIFFERENTIAL/PLATELET - Abnormal; Notable for the following components:   RBC 2.89 (*)    Hemoglobin 9.1 (*)    HCT 27.7 (*)    All other components within normal limits  POC OCCULT BLOOD, ED - Abnormal; Notable for the following components:   Fecal Occult Bld POSITIVE (*)    All other components within normal limits  TROPONIN I (HIGH SENSITIVITY) - Abnormal; Notable for the following components:   Troponin I (High Sensitivity) 159 (*)    All other components within normal limits  LIPASE, BLOOD  PROTIME-INR  TYPE AND SCREEN  TROPONIN I (HIGH SENSITIVITY)    ____________________________________________  EKG   EKG Interpretation  Date/Time:  Thursday October 19 2020 18:22:31 EDT Ventricular Rate:  90 PR Interval:  194 QRS Duration: 102 QT Interval:  398 QTC Calculation: 486 R Axis:   34 Text Interpretation: Normal sinus rhythm Left ventricular hypertrophy with repolarization abnormality ( R in aVL , Sokolow-Lyon , Cornell product ) Cannot rule out Septal infarct , age undetermined Abnormal ECG Similar to prior Confirmed by Nanda Quinton [phone]) on 10/19/2020 6:26:04 PM        ____________________________________________  RADIOLOGY  DG Chest 2 View  Result Date: 10/19/2020 CLINICAL DATA:  Dyspnea on exertion. EXAM: CHEST - 2 VIEW COMPARISON:  Single-view of the chest 12/12/2019. CT chest 03/14/2020. FINDINGS: A small nodular opacity in the right mid lung on the PA view only correlates with a nodule seen on the prior CT and is unchanged. Additional small nodules seen on CT  are not visible on this exam. No consolidative process, pneumothorax or effusion. There is cardiomegaly. Aortic atherosclerosis. No acute or focal bony abnormality. IMPRESSION: No acute disease. Cardiomegaly. Aortic Atherosclerosis (ICD10-I70.0). Electronically Signed   By: Inge Rise M.D.   On: 10/19/2020 12:46    ____________________________________________   PROCEDURES  Procedure(s) performed:   .Critical Care  Date/Time: 10/19/2020 9:53 PM Performed by: Margette Fast, MD Authorized by: Margette Fast, MD   Critical care provider statement:    Critical care time (minutes):  35   Critical care time was exclusive of:  Separately billable procedures and treating other patients and teaching time   Critical care was necessary to treat or prevent imminent or life-threatening deterioration of the following conditions:  Dehydration and shock   Critical care was time spent personally by me on the following activities:  Discussions with consultants, evaluation of  patient's response to treatment, examination of patient, ordering and performing treatments and interventions, ordering and review of laboratory studies, ordering and review of radiographic studies, pulse oximetry, re-evaluation of patient's condition, obtaining history from patient or surrogate, review of old charts, blood draw for specimens and development of treatment plan with patient or surrogate   I assumed direction of critical care for this patient from another provider in my specialty: no     Care discussed with: admitting provider     ____________________________________________   INITIAL IMPRESSION / [place] / ED COURSE  Pertinent labs & imaging results that were available during my care of the patient were reviewed by me and considered in my medical decision making (see chart for details).   Patient presents to the emergency department with symptomatic anemia.  Has had multiple GI procedures with her primary GI being [facility]. Reviewed labs from this AM in Epic. Plan to repeat labs, Protonix, and will discuss with GI.   08:45 PM  Spoke with Dr. Loletha Carrow with [facility]. Advises admit here with local GI to initially evaluate and can transfer if needed from there.   09:30 PM  Spoke with Dr. Blossom Hoops with Cards regarding the elevated troponin. Reviewed labs and EKG. EKG unchanged. Ok with trending troponin but keeping her here for Cards consult in the AM. Would only want her at [facility] with dramatically increasing troponin or resting CP. I suspect this is elevated related to demand ischemia and symptomatic anemia. No heparin with concern for GI bleeding.   Discussed patient's case with [facility] to request admission. Patient and family (if present) updated with plan. Care transferred to [facility] service.  I reviewed all nursing notes, vitals, pertinent old records, EKGs, labs, imaging (as available).   ____________________________________________  FINAL CLINICAL IMPRESSION(S) / ED  DIAGNOSES  Final diagnoses:  Symptomatic anemia  Melena  SOB (shortness of breath)  Precordial chest pain    MEDICATIONS GIVEN DURING THIS VISIT:  Medications  sodium chloride 0.9 % bolus 500 mL (0 mLs Intravenous Stopped 10/19/20 2015)  pantoprazole (PROTONIX) 80 mg /NS 100 mL IVPB (0 mg Intravenous Stopped 10/19/20 2014)   Note:  This document was prepared using Dragon voice recognition software and may include unintentional dictation errors.  Nanda Quinton, MD, [facility] Emergency Medicine    Kashia Brossard, [facility], MD 10/19/20 2155

## 2020-10-19 NOTE — Progress Notes (Addendum)
Acute Office Visit  Subjective:    Patient ID: Stacey Reyes, female    DOB: 1945-12-29, 75 y.o.   MRN: [id number]  Chief Complaint  Patient presents with   Hypertension    HPI Patient is in today for low blood pressure x 2-3 days. She reprots readings of 120/80s normally then the last few days it has been 90-100/50s. She reports that this happens intermittently and they aren't sure why. She has had to have iron and blood transfusions in the past. She denies dizziness, lightheadedness, chest pain, edema, or palpitations. She is short of breath when walking. She reports a known heart murmur and LVH.   She has had some pain on her left mid-lower side. The pain is intermittent. It is worse with activity, improves with rest. Denis urinary symptoms. She is on macrobid for prophylaxis.   She reports tar colored stools for the last few days. She that maybe this was because she had been eating blueberries but it has remained tarry. Denies fever, abdominal pain, nausea, vomiting.  Has had a few episodes of diarrhea. She has had blood in her stool before. Colonoscopy was done in 2021 and had a few polyps removed but no source of bleeding. Then had video capsule endoscopy that also did not show a source of bleeding.   She reports that typically all of these symptoms occur together when her hemoglobin is low. She reports that she normally requires a iron or blood transfusion when this happens. She had a POC Hbg yesterday that was 11.1. Normally her hemoglobin is much lower when she has these symptoms.  Past Medical History:  Diagnosis Date   Anemia    Cataract    removed bilateral   Chronic cystitis    Diverticulitis    Heart murmur    Hyperlipidemia    Hypertension    Personal history of colonic polyps-adenomas 01/07/2012   2009 - 2 diminutive adenomas (prior polyps also) 01/07/2012 - 2 diminutive adenomas      Past Surgical History:  Procedure Laterality Date   BREAST BIOPSY Right     No Scar seen    COLONOSCOPY  multiple   ESOPHAGOGASTRODUODENOSCOPY (EGD) WITH PROPOFOL N/A 11/23/2019   Procedure: ESOPHAGOGASTRODUODENOSCOPY (EGD) WITH PROPOFOL;  Surgeon: Daneil Dolin, MD;  Location: [facility];  Service: Endoscopy;  Laterality: N/A;   GIVENS CAPSULE STUDY N/A 11/23/2019   Procedure: GIVENS CAPSULE STUDY;  Surgeon: Daneil Dolin, MD;  Location: [facility];  Service: Endoscopy;  Laterality: N/A;    Family History  Problem Relation Age of Onset   Colon cancer Mother 26       80's   Breast cancer Sister    Asthma Brother    Colon polyps Neg Hx    Esophageal cancer Neg Hx    Rectal cancer Neg Hx    Stomach cancer Neg Hx     Social History   Socioeconomic History   Marital status: Married    Spouse name: Not on file   Number of children: 1   Years of education: Not on file   Highest education level: Not on file  Occupational History   Occupation: retired  Tobacco Use   Smoking status: Former    Types: Cigarettes    Quit date: 12/23/1984    Years since quitting: 35.8   Smokeless tobacco: Never  Vaping Use   Vaping Use: Never used  Substance and Sexual Activity   Alcohol use: Yes  Comment: rare   Drug use: No   Sexual activity: Not on file  Other Topics Concern   Not on file  Social History Narrative   Patient is married and retired and has 1 grown child   Social Determinants of Radio broadcast assistant Strain: Not on file  Food Insecurity: Not on file  Transportation Needs: Not on file  Physical Activity: Not on file  Stress: Not on file  Social Connections: Not on file  Intimate Partner Violence: Not on file    Outpatient Medications Prior to Visit  Medication Sig Dispense Refill   Ascorbic Acid (VITAMIN C) 1000 MG tablet Take 1,000 mg by mouth daily.     bisoprolol (ZEBETA) 5 MG tablet Take 2.5 mg by mouth daily.     Copper Gluconate (COPPER CAPS PO) Take 1 capsule by mouth daily.      cyanocobalamin ([employer] VITAMIN B12) 2000  MCG tablet Take 1 tablet (2,000 mcg total) by mouth daily.     ergocalciferol (VITAMIN D2) 1.25 MG (50000 UT) capsule Take 1 capsule (50,000 Units total) by mouth once a week. 16 capsule 3   Magnesium 200 MG TABS Take 1 tablet (200 mg total) by mouth daily. 30 tablet 6   Multiple Minerals-Vitamins (CALCIUM-MAGNESIUM-ZINC-D3 PO) Take 1 tablet by mouth daily.     Multiple Vitamin (MULTIVITAMIN) capsule Take 1 capsule by mouth daily.      nitrofurantoin (MACRODANTIN) 100 MG capsule Take 100 mg by mouth daily.     pantoprazole (PROTONIX) 40 MG tablet Take 1 tablet (40 mg total) by mouth daily. (NEEDS TO BE SEEN BEFORE NEXT REFILL) 90 tablet 0   rosuvastatin (CRESTOR) 10 MG tablet TAKE 1 TABLET BY MOUTH AT  BEDTIME 90 tablet 3   vitamin E 400 UNIT capsule Take 400 Units by mouth daily.      No facility-administered medications prior to visit.    Allergies  Allergen Reactions   Sulfa Antibiotics Rash    Review of Systems Negative unless specially indicated above in HPI.     Objective:    Physical Exam Vitals and nursing note reviewed.  Constitutional:      General: She is not in acute distress.    Appearance: Normal appearance. She is not ill-appearing, toxic-appearing or diaphoretic.  HENT:     Head: Normocephalic and atraumatic.  Eyes:     Extraocular Movements: Extraocular movements intact.     Pupils: Pupils are equal, round, and reactive to light.  Cardiovascular:     Rate and Rhythm: Normal rate and regular rhythm. No extrasystoles are present.    Heart sounds: Murmur heard.  Systolic murmur is present with a grade of 3/6.  Pulmonary:     Breath sounds: Normal breath sounds. No wheezing, rhonchi or rales.     Comments: Respirations unlabored at rest, however is short of breath with tachypnea when walking to restroom.  Chest:     Chest wall: No tenderness.  Abdominal:     General: Bowel sounds are normal. There is no distension.     Palpations: Abdomen is soft.      Tenderness: There is no abdominal tenderness. There is no right CVA tenderness, left CVA tenderness, guarding or rebound.  Musculoskeletal:     Right lower leg: No edema.     Left lower leg: No edema.  Skin:    General: Skin is warm and dry.  Neurological:     General: No focal deficit present.     Mental  Status: She is alert and oriented to person, place, and time.  Psychiatric:        Mood and Affect: Mood normal.        Behavior: Behavior normal.    BP (!) 91/58   Pulse 85   Temp (!) 97.4 F (36.3 C)   Ht _0  (1.549 m)   Wt 200 lb (90.7 kg)   SpO2 96%   BMI 37.79 kg/m  Wt Readings from Last 3 Encounters:  10/19/20 200 lb (90.7 kg)  08/02/20 198 lb (89.8 kg)  04/03/20 199 lb 9.6 oz (90.5 kg)    Health Maintenance Due  Topic Date Due   COVID-19 Vaccine (4 - Booster for Moderna series) 04/20/2020    There are no preventive care reminders to display for this patient.   Lab Results  Component Value Date   TSH 1.440 03/08/2020   Lab Results  Component Value Date   WBC 7.6 08/01/2020   HGB 12.6 08/01/2020   HCT 38.0 08/01/2020   MCV 93.6 08/01/2020   PLT 236 08/01/2020   Lab Results  Component Value Date   NA 135 08/01/2020   K 3.5 08/01/2020   CO2 28 08/01/2020   GLUCOSE 102 (H) 08/01/2020   BUN 11 08/01/2020   CREATININE 0.63 08/01/2020   BILITOT 0.8 08/01/2020   ALKPHOS 39 08/01/2020   AST 40 08/01/2020   ALT 40 08/01/2020   PROT 6.7 08/01/2020   ALBUMIN 4.1 08/01/2020   CALCIUM 9.0 08/01/2020   ANIONGAP 5 08/01/2020   EGFR 87 07/04/2020   Lab Results  Component Value Date   CHOL 185 12/05/2011   Lab Results  Component Value Date   HDL 44 12/05/2011   Lab Results  Component Value Date   LDLCALC 110 12/05/2011   Lab Results  Component Value Date   TRIG 187 (A) 12/05/2011   No results found for: [facility] Lab Results  Component Value Date   HGBA1C 5.6 02/24/2015       Assessment & Plan:   Anushri was seen today for  hypertension.  Diagnoses and all orders for this visit:  Hypotension, unspecified hypotension type Patient reports this conglomerate of symptoms usually presents when she needs a iron or blood transfusion, however POC Hgb yesterday was 11.1. Labs ordered stat as below.  -     Anemia Profile B -     CMP14+EGFR  Other iron deficiency anemia -     Cancel: CMP14+EGFR -     Cancel: Anemia Profile B -     Anemia Profile B -     CMP14+EGFR  Dyspnea on exertion EKG unchanged today, no acute findings on CXR today. BNP pending.  -     EKG 12-Lead -     Pro b natriuretic peptide (BNP) -     DG Chest 2 View  Left flank pain Negative UA. Tyelenol, heat, ice as needed for pain.  -     Urinalysis, Routine w reflex microscopic  Black tarry stools Patient will return with hemoccult card.    Stat labs results to show a 2 point drop in hemoglobin from yesterday. CXR with acute findings. EKG was unchanged from previous. UA negative. BNP resulted and was elevated. Notified patient of lab results. Discussed that elevated BNP indicated acute heart failure, but given her current hypotension, she is not a candidate for outpatient diuresis. Also discussed that since her hemoglobin has dropped 2 points from yesterday and she is report black tarry stools,  she likely has a GI bleed. Again, recommended ED evaluation given hypotension in the setting of anemia.   The patient indicates understanding of these issues and agrees with the plan.  Gwenlyn Perking, FNP

## 2020-10-19 NOTE — H&P (Signed)
History and Physical  Stacey Reyes [id number] DOB: 27-Jun-1945 DOA: 10/19/2020  Referring physician: Acquanetta Sit, MD PCP: [name], DO  Patient coming from: Home  Chief Complaint: Shortness of breath  HPI: Stacey Reyes is a 75 y.o. female with medical history significant for hypertension, hyperlipidemia, GERD, symptomatic anemia requiring iron transfusions PRBC who presents to the emergency department due to 1 week onset of exertional weakness and fatigue.  She complained of noticing dark tarry stools since last week.  She went to see her PCP yesterday due to persistent symptoms mentioned above and lab work was done.  Patient continues to have shortness of breath with occasional chest tightness.  Patient complained of soft BP this morning with SBP in the 70s to 80s and reported dizziness when she stands, so she had a repeat lab done at her PCPs office and showed 2 gram drop in hemoglobin, she was then referred to go to the ED for further evaluation.  ED Course:  In the emergency department, she was hemodynamically stable.  Work-up in the ED showed normocytic anemia and normal BMP.  Troponin x2 -159 > 175.  FOBT was positive, NT-proBNP 2974.  Influenza A, B, SARS coronavirus 2 was positive. Chest x-ray showed no acute disease. Patient was treated with Protonix, IV hydration of 500 mL NS was provided. Gastroenterology (Dr. Loletha Carrow with [facility]) was consulted and recommended outpatient can be admitted here at [facility].  Cardiology was consulted, labs and EKG were reviewed and recommended that patient could be admitted here at [facility], troponin can be trended and recommended transferring patient to [facility] only if troponin is dramatically increasing or patient develops chest pain at rest but ED medical record.  Hospitalist was asked to admit patient for further evaluation and management.  Review of Systems: Constitutional: Negative for chills and fever.  HENT: Negative for ear pain and sore  throat.   Eyes: Negative for pain and visual disturbance.  Respiratory: Positive for chest tightness and shortness of breath on exertion.  Negative for cough Cardiovascular: Negative for chest pain and palpitations.  Gastrointestinal: Positive for black stool.  Negative for abdominal pain and vomiting.  Endocrine: Negative for polyphagia and polyuria.  Genitourinary: Negative for decreased urine volume, dysuria, enuresis Musculoskeletal: Negative for arthralgias and back pain.  Skin: Negative for color change and rash.  Allergic/Immunologic: Negative for immunocompromised state.  Neurological: Negative for tremors, syncope, speech difficulty.  Hematological: Does not bruise/bleed easily.  All other systems reviewed and are negative   Past Medical History:  Diagnosis Date   Anemia    Cataract    removed bilateral   Chronic cystitis    Diverticulitis    Heart murmur    Hyperlipidemia    Hypertension    Personal history of colonic polyps-adenomas 01/07/2012   2009 - 2 diminutive adenomas (prior polyps also) 01/07/2012 - 2 diminutive adenomas     Past Surgical History:  Procedure Laterality Date   BREAST BIOPSY Right    No Scar seen    COLONOSCOPY  multiple   ESOPHAGOGASTRODUODENOSCOPY (EGD) WITH PROPOFOL N/A 11/23/2019   Procedure: ESOPHAGOGASTRODUODENOSCOPY (EGD) WITH PROPOFOL;  Surgeon: Daneil Dolin, MD;  Location: [facility];  Service: Endoscopy;  Laterality: N/A;   GIVENS CAPSULE STUDY N/A 11/23/2019   Procedure: GIVENS CAPSULE STUDY;  Surgeon: Daneil Dolin, MD;  Location: [facility];  Service: Endoscopy;  Laterality: N/A;    Social History:  reports that she quit smoking about 35 years ago. She  has never used smokeless tobacco. She reports current alcohol use. She reports that she does not use drugs.   Allergies  Allergen Reactions   Sulfa Antibiotics Rash    Family History  Problem Relation Age of Onset   Colon cancer Mother 19       80's   Breast cancer  Sister    Asthma Brother    Colon polyps Neg Hx    Esophageal cancer Neg Hx    Rectal cancer Neg Hx    Stomach cancer Neg Hx      Prior to Admission medications   Medication Sig Start Date End Date Taking? Authorizing Provider  Ascorbic Acid (VITAMIN C) 1000 MG tablet Take 1,000 mg by mouth daily.   Yes [provider]  bisoprolol (ZEBETA) 5 MG tablet Take 2.5 mg by mouth daily. 01/26/20  Yes [provider]  Copper Gluconate (COPPER CAPS PO) Take 1 capsule by mouth daily.    Yes [provider]  cyanocobalamin ([employer]S VITAMIN B12) 2000 MCG tablet Take 1 tablet (2,000 mcg total) by mouth daily. 03/25/19  Yes Johnson, Clanford L, MD  ergocalciferol (VITAMIN D2) 1.25 MG (50000 UT) capsule Take 1 capsule (50,000 Units total) by mouth once a week. 09/27/20  Yes Pennington, Rebekah M, PA-C  Magnesium 200 MG TABS Take 1 tablet (200 mg total) by mouth daily. 03/23/19  Yes Lendon Colonel, NP  Multiple Minerals-Vitamins (CALCIUM-MAGNESIUM-ZINC-D3 PO) Take 1 tablet by mouth daily.   Yes [provider]  Multiple Vitamin (MULTIVITAMIN) capsule Take 1 capsule by mouth daily.    Yes [provider]  nitrofurantoin (MACRODANTIN) 100 MG capsule Take 100 mg by mouth daily.   Yes [provider]  pantoprazole (PROTONIX) 40 MG tablet Take 1 tablet (40 mg total) by mouth daily. (NEEDS TO BE SEEN BEFORE NEXT REFILL) 08/18/20  Yes Gottschalk, Ashly M, DO  rosuvastatin (CRESTOR) 10 MG tablet TAKE 1 TABLET BY MOUTH AT  BEDTIME 04/12/20  Yes Gottschalk, Ashly M, DO  vitamin E 400 UNIT capsule Take 400 Units by mouth daily.    Yes [provider]    Physical Exam: BP (!) 119/52   Pulse 77   Temp 98.5 F (36.9 C) (Oral)   Resp 15   Ht '5\' 1"'$  (1.549 m)   Wt 90 kg   SpO2 97%   BMI 37.49 kg/m   General: 75 y.o. year-old female well developed well nourished in no acute distress.  Alert and oriented x3. HEENT: NCAT, EOMI Neck: Supple, trachea  medial Cardiovascular: Regular rate and rhythm with no rubs or gallops.  No thyromegaly or JVD noted.  No lower extremity edema. 2/4 pulses in all 4 extremities. Respiratory: Clear to auscultation with no wheezes or rales. Good inspiratory effort. Abdomen: Soft, nontender nondistended with normal bowel sounds x4 quadrants. Muskuloskeletal: No cyanosis, clubbing or edema noted bilaterally Neuro: CN II-XII intact, strength 5/5 x 4, sensation, reflexes intact Skin: No ulcerative lesions noted or rashes Psychiatry: Judgement and insight appear normal. Mood is appropriate for condition and setting          Labs on Admission:  Basic Metabolic Panel: Recent Labs  Lab 10/19/20 1244 10/19/20 1919  NA 139 135  K 4.2 3.7  CL 103 104  CO2 24 26  GLUCOSE 129* 106*  BUN 16 19  CREATININE 0.59 0.54  CALCIUM 8.8 8.4*   Liver Function Tests: Recent Labs  Lab 10/19/20 1244 10/19/20 1919  AST 34 35  ALT 30  33  ALKPHOS 33* 28*  BILITOT 0.2 0.4  PROT 5.5* 5.7*  ALBUMIN 3.9 3.6   Recent Labs  Lab 10/19/20 1919  LIPASE 35   No results for input(s): AMMONIA in the last 168 hours. CBC: Recent Labs  Lab 10/19/20 1244 10/19/20 1919  WBC 7.1 6.6  NEUTROABS 4.1 3.4  HGB 9.7* 9.1*  HCT 28.6* 27.7*  MCV 92 95.8  PLT 167 160   Cardiac Enzymes: No results for input(s): CKTOTAL, CKMB, CKMBINDEX, TROPONINI in the last 168 hours.  BNP (last 3 results) Recent Labs    11/22/19 1112  BNP 239.0*    ProBNP (last 3 results) Recent Labs    10/19/20 1244  PROBNP 2,974*    CBG: No results for input(s): GLUCAP in the last 168 hours.  Radiological Exams on Admission: DG Chest 2 View  Result Date: 10/19/2020 CLINICAL DATA:  Dyspnea on exertion. EXAM: CHEST - 2 VIEW COMPARISON:  Single-view of the chest 12/12/2019. CT chest 03/14/2020. FINDINGS: A small nodular opacity in the right mid lung on the PA view only correlates with a nodule seen on the prior CT and is unchanged. Additional  small nodules seen on CT are not visible on this exam. No consolidative process, pneumothorax or effusion. There is cardiomegaly. Aortic atherosclerosis. No acute or focal bony abnormality. IMPRESSION: No acute disease. Cardiomegaly. Aortic Atherosclerosis (ICD10-I70.0). Electronically Signed   By: Inge Rise M.D.   On: 10/19/2020 12:46    EKG: I independently viewed the EKG done and my findings are as followed: Normal sinus rhythm at a rate of 90 bpm and LVH with repolarization abnormality.  Assessment/Plan Present on Admission:  Symptomatic anemia  Hyperlipidemia  Hypertension, essential  Principal Problem:   Symptomatic anemia Active Problems:   Hypertension, essential   Hyperlipidemia   Elevated troponin   GI bleed  Symptomatic anemia with suspicion for GI bleed H/H= 9.1/27.7, this was 11.1 on 7/27 Hemoccult was positive Continue IV Protonix 40 mg twice daily Gastroenterologist  will be consulted to see patient in the morning  Elevated troponin possibly secondary to type II demand ischemia r/o NSTEMI Troponin x2- 159 > 175; continue to trend troponin.  Patient denies chest pain at this time Continue telemetry  [facility]iology at [facility] was consulted by ED physician and recommended trending troponin and to only transfer patient if troponin continues to increase significantly or complaint of chest pain at rest.   Essential hypertension (controlled) BP meds will be held at this time due to soft BP  Hyperlipidemia Continue Crestor when patient resumes oral intake (currently n.p.o.)   DVT prophylaxis: SCDs  Code Status: Full code  Family Communication: None at bedside  Disposition Plan:  Patient is from:                        home Anticipated DC to:                   SNF or family members home Anticipated DC date:               2-3 days Anticipated DC barriers:          Patient requires inpatient management due to symptomatic anemia possibly due to GI bleed and elevated  troponin pending gastroenterology and cardiology consults.    Consults called: Gastroenterology, cardiology  Admission status: Observation    Bernadette Hoit MD [facility]  10/20/2020, 3:40 AM

## 2020-10-19 NOTE — ED Triage Notes (Signed)
Referral from her PCP for evaluation of shortness of breath and low hemoglobin, states she becomes mores short of breath with exertion

## 2020-10-20 ENCOUNTER — Observation Stay (HOSPITAL_COMMUNITY): Payer: Medicare Other | Admitting: Certified Registered"

## 2020-10-20 ENCOUNTER — Other Ambulatory Visit (HOSPITAL_COMMUNITY): Payer: Medicare Other

## 2020-10-20 ENCOUNTER — Other Ambulatory Visit: Payer: Self-pay

## 2020-10-20 ENCOUNTER — Ambulatory Visit: Payer: Medicare Other

## 2020-10-20 ENCOUNTER — Encounter (HOSPITAL_COMMUNITY)
Admission: EM | Disposition: A | Discharge status: Home / Self Care | Payer: Self-pay | Source: Home / Self Care | Attending: Family Medicine

## 2020-10-20 ENCOUNTER — Encounter (HOSPITAL_COMMUNITY): Payer: Self-pay | Admitting: Internal Medicine

## 2020-10-20 DIAGNOSIS — I10 Essential (primary) hypertension: Secondary | ICD-10-CM | POA: Diagnosis present

## 2020-10-20 DIAGNOSIS — K5520 Angiodysplasia of colon without hemorrhage: Secondary | ICD-10-CM | POA: Diagnosis present

## 2020-10-20 DIAGNOSIS — I340 Nonrheumatic mitral (valve) insufficiency: Secondary | ICD-10-CM | POA: Diagnosis not present

## 2020-10-20 DIAGNOSIS — R0600 Dyspnea, unspecified: Secondary | ICD-10-CM

## 2020-10-20 DIAGNOSIS — Z882 Allergy status to sulfonamides status: Secondary | ICD-10-CM | POA: Diagnosis not present

## 2020-10-20 DIAGNOSIS — R072 Precordial pain: Secondary | ICD-10-CM | POA: Diagnosis present

## 2020-10-20 DIAGNOSIS — R778 Other specified abnormalities of plasma proteins: Secondary | ICD-10-CM | POA: Diagnosis not present

## 2020-10-20 DIAGNOSIS — Z66 Do not resuscitate: Secondary | ICD-10-CM | POA: Diagnosis present

## 2020-10-20 DIAGNOSIS — I422 Other hypertrophic cardiomyopathy: Secondary | ICD-10-CM | POA: Diagnosis present

## 2020-10-20 DIAGNOSIS — D649 Anemia, unspecified: Secondary | ICD-10-CM | POA: Diagnosis present

## 2020-10-20 DIAGNOSIS — Z79899 Other long term (current) drug therapy: Secondary | ICD-10-CM | POA: Diagnosis not present

## 2020-10-20 DIAGNOSIS — D62 Acute posthemorrhagic anemia: Secondary | ICD-10-CM | POA: Diagnosis present

## 2020-10-20 DIAGNOSIS — Z86010 Personal history of colonic polyps: Secondary | ICD-10-CM | POA: Diagnosis not present

## 2020-10-20 DIAGNOSIS — K922 Gastrointestinal hemorrhage, unspecified: Secondary | ICD-10-CM

## 2020-10-20 DIAGNOSIS — K921 Melena: Principal | ICD-10-CM

## 2020-10-20 DIAGNOSIS — E785 Hyperlipidemia, unspecified: Secondary | ICD-10-CM | POA: Diagnosis present

## 2020-10-20 DIAGNOSIS — Z87891 Personal history of nicotine dependence: Secondary | ICD-10-CM | POA: Diagnosis not present

## 2020-10-20 DIAGNOSIS — Z20822 Contact with and (suspected) exposure to covid-19: Secondary | ICD-10-CM | POA: Diagnosis present

## 2020-10-20 DIAGNOSIS — R7989 Other specified abnormal findings of blood chemistry: Secondary | ICD-10-CM

## 2020-10-20 HISTORY — PX: ESOPHAGOGASTRODUODENOSCOPY (EGD) WITH PROPOFOL: SHX5813

## 2020-10-20 LAB — COMPREHENSIVE METABOLIC PANEL
ALT: 30 U/L (ref 0–44)
AST: 32 U/L (ref 15–41)
Albumin: 3.1 g/dL — ABNORMAL LOW (ref 3.5–5.0)
Alkaline Phosphatase: 21 U/L — ABNORMAL LOW (ref 38–126)
Anion gap: 5 (ref 5–15)
BUN: 20 mg/dL (ref 8–23)
CO2: 25 mmol/L (ref 22–32)
Calcium: 8.2 mg/dL — ABNORMAL LOW (ref 8.9–10.3)
Chloride: 108 mmol/L (ref 98–111)
Creatinine, Ser: 0.61 mg/dL (ref 0.44–1.00)
GFR, Estimated: >60 mL/min (ref >60)
Glucose, Bld: 108 mg/dL — ABNORMAL HIGH (ref 70–99)
Potassium: 3.7 mmol/L (ref 3.5–5.1)
Sodium: 138 mmol/L (ref 135–145)
Total Bilirubin: 0.8 mg/dL (ref 0.3–1.2)
Total Protein: 5 g/dL — ABNORMAL LOW (ref 6.5–8.1)

## 2020-10-20 LAB — TROPONIN I (HIGH SENSITIVITY)
Troponin I (High Sensitivity): 153 ng/L (ref <18)
Troponin I (High Sensitivity): 135 ng/L (ref <18)

## 2020-10-20 LAB — CBC
HCT: 24.4 % — ABNORMAL LOW (ref 36.0–46.0)
Hemoglobin: 8.2 g/dL — ABNORMAL LOW (ref 12.0–15.0)
MCH: 31.8 pg (ref 26.0–34.0)
MCHC: 33.6 g/dL (ref 30.0–36.0)
MCV: 94.6 fL (ref 80.0–100.0)
Platelets: 142 10*3/uL — ABNORMAL LOW (ref 150–400)
RBC: 2.58 MIL/uL — ABNORMAL LOW (ref 3.87–5.11)
RDW: 13.5 % (ref 11.5–15.5)
WBC: 5.9 10*3/uL (ref 4.0–10.5)
nRBC: 0 % (ref 0.0–0.2)

## 2020-10-20 LAB — PROTIME-INR
INR: 1.1 (ref 0.8–1.2)
Prothrombin Time: 14.2 seconds (ref 11.4–15.2)

## 2020-10-20 LAB — PHOSPHORUS: Phosphorus: 3.2 mg/dL (ref 2.5–4.6)

## 2020-10-20 LAB — MAGNESIUM: Magnesium: 2 mg/dL (ref 1.7–2.4)

## 2020-10-20 LAB — APTT: aPTT: 29 seconds (ref 24–36)

## 2020-10-20 SURGERY — ESOPHAGOGASTRODUODENOSCOPY (EGD) WITH PROPOFOL
Anesthesia: General

## 2020-10-20 MED ORDER — SODIUM CHLORIDE 0.9 % IV SOLN: INTRAVENOUS | Status: DC

## 2020-10-20 MED ORDER — PROPOFOL 10 MG/ML IV BOLUS
INTRAVENOUS | Status: DC | PRN
  Administered 2020-10-20: 50 mg via INTRAVENOUS
  Administered 2020-10-20: 80 mg via INTRAVENOUS

## 2020-10-20 MED ORDER — LACTATED RINGERS IV SOLN: INTRAVENOUS | Status: DC

## 2020-10-20 MED ORDER — FENTANYL CITRATE (PF) 100 MCG/2ML IJ SOLN: 25.0000 ug | INTRAMUSCULAR | Status: DC | PRN

## 2020-10-20 MED ORDER — PANTOPRAZOLE SODIUM 40 MG IV SOLR
40.0000 mg | Freq: Two times a day (BID) | INTRAVENOUS | Status: DC
  Administered 2020-10-20 – 2020-10-22 (×5): 40 mg via INTRAVENOUS
  Filled 2020-10-20 (×5): qty 40

## 2020-10-20 MED ORDER — BISOPROLOL FUMARATE 5 MG PO TABS
2.5000 mg | ORAL_TABLET | Freq: Every day | ORAL | Status: DC
  Administered 2020-10-20 – 2020-10-22 (×3): 2.5 mg via ORAL
  Filled 2020-10-20 (×3): qty 1

## 2020-10-20 MED ORDER — ROSUVASTATIN CALCIUM 10 MG PO TABS
10.0000 mg | ORAL_TABLET | Freq: Every day | ORAL | Status: DC
  Administered 2020-10-20 – 2020-10-22 (×3): 10 mg via ORAL
  Filled 2020-10-20 (×3): qty 1

## 2020-10-20 MED ORDER — LIDOCAINE HCL (CARDIAC) PF 100 MG/5ML IV SOSY
PREFILLED_SYRINGE | INTRAVENOUS | Status: DC | PRN
  Administered 2020-10-20: 50 mg via INTRAVENOUS

## 2020-10-20 NOTE — Consult Note (Signed)
$'@LOGO'R$ @   Referring Provider: [facility] hospitalist Primary Care Physician:  [name], DO Primary Gastroenterologist:  [facility]  Date of Admission: 10/19/20 Date of Consultation: 10/20/20  Reason for Consultation: Anemia, heme positive stool  HPI:  Stacey Reyes is a 75 y.o. year old female with history of hypertrophic cardiomyopathy, HTN, HLD, IDA likely secondary to [facility] with extensive GI evaluation in 2020 and 2021 detailed below, previously requiring PRBCs and now following with oncology for IV iron infusions who presented to the emergency room due to 1 week of exertional weakness, fatigue, shortness of breath, chest tightness, as well as dark tarry stools.  She was initially seen outpatient by her primary care provider between 7/27-7/28.  BNP was also elevated at 2174.  Subsequently, PCP recommended ED evaluation.  ED Course:  Initial BP soft at 91/58, otherwise hemodynamically stable.  O2 saturation 96+ percent on room air. Hemoglobin 9.1, down from 11.1 on 7/27.  FOBT positive.  WBC and platelets within normal limits. Electrolytes and kidney function within normal limits.  BUN normal at 19. Lipase normal. INR 1.0 Troponin elevated at 159>> 175>> 153 COVID-negative Chest x-ray with no acute disease. Cardiology was consulted and recommended trending troponin, transfer to [facility] if troponin dramatically increasing or if patient develops chest pain.  She started on IV Protonix and given IV fluids.  Today: Patient reports over the last week she has had increased shortness of breath with associated chest pain with little exertion.  Symptoms resolved with rest.  No symptoms at rest.  Also noted her stools became dark over the last 1-2 weeks.  Initially, she thought this was secondary to blueberries; however, after she stopped eating blueberries, her stools continue to be dark and is tarry.  Last dark stool was yesterday morning.  Denies abdominal pain, nausea, vomiting,  history of reflux, or dysphagia.  She has been on Protonix 40 mg daily due to history of IDA secondary to GI blood loss.  No unintentional weight loss.  Has good appetite.  She has noticed some left back/flank pain with activity, resolves with rest.  States this is also common symptom when she has a decline in her hemoglobin.  Denies lightheadedness, dizziness, weakness.  States she noticed her blood pressure was fluctuating all over the place yesterday.  Systolic blood pressure as low as upper 80s which is why she went to see her primary care provider.  No NSAIDs.  Had eczema breakout on her legs in early July. She was started on Prednisone course, 2 weeks. Last dose was 2 weeks ago.    Prior GI evaluation: EGD August 2020: Duodenal erosions biopsied (benign), otherwise normal exam.  Colonoscopy August 2020: 2 polyps removed (tubular adenomas), diverticulosis in the sigmoid, descending, transverse colon, otherwise normal exam. EGD August 2021: Normal esophagus, small hiatal hernia, innocent appearing duodenal erosions. Small bowel capsule August 2021: Benign duodenal erosions without active bleeding and is appearing small bowel polypoid-like lesion without bleeding, large amounts of old blood in cecum with no obvious source. Colonoscopy November 2021: 7 polyps removed (tubular adenomas), diverticulosis in sigmoid, descending, transverse, and descending colon, erythema and petechia in association with diverticulosis in the left colon biopsies (benign), external and internal hemorrhoids. Givens capsule November 2021: Duodenitis, 1 small AVM in proximal small bowel about 8 minutes from first duodenal image, 1 tiny AVM at 1 hour 19 minutes, numerous tiny lesions versus luminal contents of unclear significance and distal small bowel.  Past Medical History:  Diagnosis Date  Anemia    Cataract    removed bilateral   Chronic cystitis    Diverticulitis    Heart murmur    Hyperlipidemia     Hypertension    Personal history of colonic polyps-adenomas 01/07/2012   2009 - 2 diminutive adenomas (prior polyps also) 01/07/2012 - 2 diminutive adenomas      Past Surgical History:  Procedure Laterality Date   BREAST BIOPSY Right    No Scar seen    COLONOSCOPY  multiple   ESOPHAGOGASTRODUODENOSCOPY (EGD) WITH PROPOFOL N/A 11/23/2019   Procedure: ESOPHAGOGASTRODUODENOSCOPY (EGD) WITH PROPOFOL;  Surgeon: Daneil Dolin, MD;  Location: [facility];  Service: Endoscopy;  Laterality: N/A;   GIVENS CAPSULE STUDY N/A 11/23/2019   Procedure: GIVENS CAPSULE STUDY;  Surgeon: Daneil Dolin, MD;  Location: [facility] ENDO SUITE;  Service: Endoscopy;  Laterality: N/A;    Prior to Admission medications   Medication Sig Start Date End Date Taking? Authorizing Provider  Ascorbic Acid (VITAMIN C) 1000 MG tablet Take 1,000 mg by mouth daily.   Yes [provider]  bisoprolol (ZEBETA) 5 MG tablet Take 2.5 mg by mouth daily. 01/26/20  Yes [provider]  Copper Gluconate (COPPER CAPS PO) Take 1 capsule by mouth daily.    Yes [provider]  cyanocobalamin ([facility] VITAMIN B12) 2000 MCG tablet Take 1 tablet (2,000 mcg total) by mouth daily. 03/25/19  Yes Johnson, Clanford L, MD  ergocalciferol (VITAMIN D2) 1.25 MG (50000 UT) capsule Take 1 capsule (50,000 Units total) by mouth once a week. 09/27/20  Yes Pennington, Rebekah M, PA-C  Magnesium 200 MG TABS Take 1 tablet (200 mg total) by mouth daily. 03/23/19  Yes Lendon Colonel, NP  Multiple Minerals-Vitamins (CALCIUM-MAGNESIUM-ZINC-D3 PO) Take 1 tablet by mouth daily.   Yes [provider]  Multiple Vitamin (MULTIVITAMIN) capsule Take 1 capsule by mouth daily.    Yes [provider]  nitrofurantoin (MACRODANTIN) 100 MG capsule Take 100 mg by mouth daily.   Yes [provider]  pantoprazole (PROTONIX) 40 MG tablet Take 1 tablet (40 mg total) by mouth daily. (NEEDS TO BE SEEN BEFORE NEXT REFILL) 08/18/20  Yes  Gottschalk, Ashly M, DO  rosuvastatin (CRESTOR) 10 MG tablet TAKE 1 TABLET BY MOUTH AT  BEDTIME 04/12/20  Yes Gottschalk, Ashly M, DO  vitamin E 400 UNIT capsule Take 400 Units by mouth daily.    Yes [provider]    Current Facility-Administered Medications  Medication Dose Route Frequency Provider Last Rate Last Admin   pantoprazole (PROTONIX) injection 40 mg  40 mg Intravenous Q12H Adefeso, Oladapo, DO       Current Outpatient Medications  Medication Sig Dispense Refill   Ascorbic Acid (VITAMIN C) 1000 MG tablet Take 1,000 mg by mouth daily.     bisoprolol (ZEBETA) 5 MG tablet Take 2.5 mg by mouth daily.     Copper Gluconate (COPPER CAPS PO) Take 1 capsule by mouth daily.      cyanocobalamin ([employer] VITAMIN B12) 2000 MCG tablet Take 1 tablet (2,000 mcg total) by mouth daily.     ergocalciferol (VITAMIN D2) 1.25 MG (50000 UT) capsule Take 1 capsule (50,000 Units total) by mouth once a week. 16 capsule 3   Magnesium 200 MG TABS Take 1 tablet (200 mg total) by mouth daily. 30 tablet 6   Multiple Minerals-Vitamins (CALCIUM-MAGNESIUM-ZINC-D3 PO) Take 1 tablet by mouth daily.     Multiple Vitamin (MULTIVITAMIN) capsule Take 1 capsule by mouth daily.  nitrofurantoin (MACRODANTIN) 100 MG capsule Take 100 mg by mouth daily.     pantoprazole (PROTONIX) 40 MG tablet Take 1 tablet (40 mg total) by mouth daily. (NEEDS TO BE SEEN BEFORE NEXT REFILL) 90 tablet 0   rosuvastatin (CRESTOR) 10 MG tablet TAKE 1 TABLET BY MOUTH AT  BEDTIME 90 tablet 3   vitamin E 400 UNIT capsule Take 400 Units by mouth daily.       Allergies as of 10/19/2020 - Review Complete 10/19/2020  Allergen Reaction Noted   Sulfa antibiotics Rash 04/29/2019    Family History  Problem Relation Age of Onset   Colon cancer Mother 62       80's   Breast cancer Sister    Asthma Brother    Colon polyps Neg Hx    Esophageal cancer Neg Hx    Rectal cancer Neg Hx    Stomach cancer Neg Hx     Social History    Socioeconomic History   Marital status: Married    Spouse name: Not on file   Number of children: 1   Years of education: Not on file   Highest education level: Not on file  Occupational History   Occupation: retired  Tobacco Use   Smoking status: Former    Types: Cigarettes    Quit date: 12/23/1984    Years since quitting: 35.8   Smokeless tobacco: Never  Vaping Use   Vaping Use: Never used  Substance and Sexual Activity   Alcohol use: Yes    Comment: rare   Drug use: No   Sexual activity: Not on file  Other Topics Concern   Not on file  Social History Narrative   Patient is married and retired and has 1 grown child   Social Determinants of Radio broadcast assistant Strain: Not on file  Food Insecurity: Not on file  Transportation Needs: Not on file  Physical Activity: Not on file  Stress: Not on file  Social Connections: Not on file  Intimate Partner Violence: Not on file    Review of Systems: Gen: Denies fever, chills, cold or flulike symptoms, presyncope, syncope. CV: See HPI Resp: See HPI GI: See HPI GU : Denies urinary burning, urinary frequency, urinary incontinence.  MS: Denies joint pain Derm: See HPI Psych: Denies depression, anxiety Heme: See HPI  Physical Exam: Vital signs in last 24 hours: Temp:  [97.4 F (36.3 C)-98.5 F (36.9 C)] 98.5 F (36.9 C) (07/28 1818) Pulse Rate:  [75-91] 80 (07/29 0430) Resp:  [9-22] 13 (07/29 0430) BP: (91-130)/(47-69) 116/54 (07/29 0430) SpO2:  [94 %-99 %] 97 % (07/29 0430) FiO2 (%):  [21 %] 21 % (07/29 0353) Weight:  [90 kg-90.7 kg] 90 kg (07/28 1844)   General:   Alert,  Well-developed, well-nourished, pleasant and cooperative in NAD Head:  Normocephalic and atraumatic. Eyes:  Sclera clear, no icterus.   Conjunctiva pink. Ears:  Normal auditory acuity. Lungs:  Clear throughout to auscultation.   No wheezes, crackles, or rhonchi. No acute distress. Heart:  Regular rate and rhythm. Systolic murmur  appreciated.  Abdomen:  Soft, nontender and nondistended. No masses, hepatosplenomegaly or hernias noted. Normal bowel sounds, without guarding, and without rebound.   Rectal:  Deferred  Msk:  Symmetrical without gross deformities. Normal posture. Extremities:  Without edema. Neurologic:  Alert and  oriented x4;  grossly normal neurologically. Skin:  Intact without significant lesions or rashes. Psych:  Normal mood and affect.  Intake/Output from previous day: 07/28 0701 -  07/29 0700 In: 600 [IV Piggyback:600] Out: -  Intake/Output this shift: No intake/output data recorded.  Lab Results: Recent Labs    10/19/20 1244 10/19/20 1919 10/20/20 0439  WBC 7.1 6.6 5.9  HGB 9.7* 9.1* 8.2*  HCT 28.6* 27.7* 24.4*  PLT 167 160 142*   BMET Recent Labs    10/19/20 1244 10/19/20 1919 10/20/20 0439  NA 139 135 138  K 4.2 3.7 3.7  CL 103 104 108  CO2 '24 26 25  '$ GLUCOSE 129* 106* 108*  BUN '16 19 20  '$ CREATININE 0.59 0.54 0.61  CALCIUM 8.8 8.4* 8.2*   LFT Recent Labs    10/19/20 1244 10/19/20 1919 10/20/20 0439  PROT 5.5* 5.7* 5.0*  ALBUMIN 3.9 3.6 3.1*  AST 34 35 32  ALT 30 33 30  ALKPHOS 33* 28* 21*  BILITOT 0.2 0.4 0.8   PT/INR Recent Labs    10/19/20 1919 10/20/20 0439  LABPROT 13.4 14.2  INR 1.0 1.1    Studies/Results: DG Chest 2 View  Result Date: 10/19/2020 CLINICAL DATA:  Dyspnea on exertion. EXAM: CHEST - 2 VIEW COMPARISON:  Single-view of the chest 12/12/2019. CT chest 03/14/2020. FINDINGS: A small nodular opacity in the right mid lung on the PA view only correlates with a nodule seen on the prior CT and is unchanged. Additional small nodules seen on CT are not visible on this exam. No consolidative process, pneumothorax or effusion. There is cardiomegaly. Aortic atherosclerosis. No acute or focal bony abnormality. IMPRESSION: No acute disease. Cardiomegaly. Aortic Atherosclerosis (ICD10-I70.0). Electronically Signed   By: Inge Rise M.D.   On:  10/19/2020 12:46    Impression: 75 year old female with history of hypertrophic cardiomyopathy, HTN, HLD, IDA with extensive GI evaluation in 2020 in 2021, found to have colon polyps, duodenitis, and small bowel AVMs as detailed above, previously requiring PRBCs and now following with oncology for IV iron infusions who presented to the emergency room due to 1 week of exertional dyspnea with associated chest tightness as well as dark tarry stools.  Initial hemoglobin was 9.1, down from 11.1 on 7/27, FOBT positive.  Troponin elevated at 159.  Outpatient lab with BNP elevated at 2174.  Chest x-ray with no acute disease.  Cardiology and GI were consulted.    Symptomatic acute on chronic anemia with melena and heme positive stool: Hemoglobin 9.1 on admission, down from 11.1 on 7/27.  Hemoglobin down to 8.2 this morning.  Dark tarry stools x1 week.  Chronically on PPI daily.  Denies NSAIDs but recently completed 2-week course of prednisone due to eczema outbreak.  Known history of duodenitis and small bowel AVMs as detailed in HPI.  Acute decline possibly secondary to bleeding AVMs versus gastritis, duodenitis, PUD in the setting of prednisone course. With normal BUN, can't rule out right sided colonic lesion.   Would recommend starting with EGD with possible push enteroscopy for further evaluation.  However, with elevated troponin, history of hypertrophic cardiomyopathy and elevated BNP, would like cardiology input/clearance prior to procedures.  Overall, suspect demand ischemia in the setting of symptomatic anemia.  Encouragingly, patient has no chest pain at rest.  Plan: Continue IV PPI twice daily. Would recommend EGD with possible push enteroscopy for further evaluation of acute on chronic anemia, melena, heme positive stool pending cardiology evaluation/clearance. Keep n.p.o. for now. May have clear liquids if unable to complete procedures today.    LOS: 0 days    10/20/2020, 7:16 AM   Aliene Altes, [facility]

## 2020-10-20 NOTE — Addendum Note (Signed)
Addended by: Gwenlyn Perking on: 10/20/2020 01:04 PM   Modules accepted: Level of Service

## 2020-10-20 NOTE — Transfer of Care (Signed)
Immediate Anesthesia Transfer of Care Note  Patient: LYAN BIFFLE  Procedure(s) Performed: ESOPHAGOGASTRODUODENOSCOPY (EGD) WITH PROPOFOL  Patient Location: PACU  Anesthesia Type:General  Level of Consciousness: drowsy  Airway & Oxygen Therapy: Patient Spontanous Breathing  Post-op Assessment: Report given to RN and Post -op Vital signs reviewed and stable  Post vital signs: Reviewed and stable  Last Vitals:  Vitals Value Taken Time  BP 73/46   Temp    Pulse 88 10/20/20 1245  Resp 16 10/20/20 1245  SpO2 96 % 10/20/20 1245  Vitals shown include unvalidated device data.  Last Pain:  Vitals:   10/20/20 1235  TempSrc:   PainSc: 0-No pain      Patients Stated Pain Goal: 6 ([phone] [id number])  Complications: No notable events documented.

## 2020-10-20 NOTE — Op Note (Signed)
[facility] Patient Name: Stacey Reyes Procedure Date: 10/20/2020 12:15 PM MRN: [id number] Date of Birth: 23-Oct-1945 Attending MD: Elon Alas. Abbey Chatters DO CSN: [id number] Age: 75 Admit Type: Outpatient Procedure:                Upper GI endoscopy Indications:              Melena Providers:                Elon Alas. Abbey Chatters, DO, Gwenlyn Fudge, RN, Aram Candela Referring MD:              Medicines:                See the Anesthesia note for documentation of the                            administered medications Complications:            No immediate complications. Estimated Blood Loss:     Estimated blood loss: none. Procedure:                Pre-Anesthesia Assessment:                           - The anesthesia plan was to use monitored                            anesthesia care (MAC).                           After obtaining informed consent, the endoscope was                            passed under direct vision. Throughout the                            procedure, the patient's blood pressure, pulse, and                            oxygen saturations were monitored continuously. The                            [id number]-[id number] ([id number]) scope was introduced through the                            mouth, and advanced to the second part of duodenum.                            The upper GI endoscopy was accomplished without                            difficulty. The patient tolerated the procedure                            well. Scope In: 12:38:21 PM Scope Out: 12:41:21 PM  Total Procedure Duration: 0 hours 3 minutes 0 seconds  Findings:      There is no endoscopic evidence of Barrett's esophagus, bleeding, areas       of erosion, esophagitis, ulcerations or varices in the entire esophagus.      There is no endoscopic evidence of bleeding, erythema, erythema or       inflammatory changes suggestive of gastritis, ulceration or varices in       the entire  examined stomach.      The duodenal bulb, first portion of the duodenum and second portion of       the duodenum were normal. Impression:               - Normal duodenal bulb, first portion of the                            duodenum and second portion of the duodenum.                           - No specimens collected. Moderate Sedation:      Per Anesthesia Care Recommendation:           - Return patient to hospital ward for ongoing care.                           - Advance diet as tolerated.                           - Use a proton pump inhibitor PO BID.                           - No bleeding seen on todays exam. Possible oozing                            from AVMs in the jejunum not reached today.                            Continue to monitor Hgb and transfuse for less than                            7. Can consider repeat outpatient capsule endosocpy                            or starting octreotide therapy in the outpatient                            setting given her history of AVMs on capsule                            endoscopy. Procedure Code(s):        --- Professional ---                           [phone], Esophagogastroduodenoscopy, flexible,                            transoral; diagnostic, including  collection of                            specimen(s) by brushing or washing, when performed                            (separate procedure) Diagnosis Code(s):        --- Professional ---                           K92.1, Melena (includes Hematochezia) CPT copyright 2019 American Medical Association. All rights reserved. The codes documented in this report are preliminary and upon coder review may  be revised to meet current compliance requirements. Elon Alas. Abbey Chatters, DO [place] Abbey Chatters, DO 10/20/2020 12:50:45 PM This report has been signed electronically. Number of Addenda: 0

## 2020-10-20 NOTE — Anesthesia Postprocedure Evaluation (Signed)
Anesthesia Post Note  Patient: Stacey Reyes  Procedure(s) Performed: ESOPHAGOGASTRODUODENOSCOPY (EGD) WITH PROPOFOL  Patient location during evaluation: Phase II Anesthesia Type: General Level of consciousness: awake Pain management: pain level controlled Vital Signs Assessment: post-procedure vital signs reviewed and stable Respiratory status: spontaneous breathing and respiratory function stable Cardiovascular status: blood pressure returned to baseline and stable Postop Assessment: no headache and no apparent nausea or vomiting Anesthetic complications: no Comments: Late entry   No notable events documented.   Last Vitals:  Vitals:   10/20/20 1300 10/20/20 1410  BP: (!) 106/46 116/74  Pulse: 83 86  Resp: 18   Temp:  37.1 C  SpO2: 95% 95%    Last Pain:  Vitals:   10/20/20 1410  TempSrc: Oral  PainSc:                  Louann Sjogren

## 2020-10-20 NOTE — ED Notes (Signed)
Pt to ENDO

## 2020-10-20 NOTE — Interval H&P Note (Signed)
History and Physical Interval Note:  10/20/2020 12:21 PM  Stacey Reyes  has presented today for surgery, with the diagnosis of symptomatic acute on chronic anemia, heme positive stool, dark stool.  The various methods of treatment have been discussed with the patient and family. After consideration of risks, benefits and other options for treatment, the patient has consented to  Procedure(s): ESOPHAGOGASTRODUODENOSCOPY (EGD) WITH PROPOFOL (N/A) as a surgical intervention.  The patient's history has been reviewed, patient examined, no change in status, stable for surgery.  I have reviewed the patient's chart and labs.  Questions were answered to the patient's satisfaction.     Eloise Harman

## 2020-10-20 NOTE — H&P (View-Only) (Signed)
$'@LOGO'V$ @   Referring Provider: [facility] Primary Care Physician:  [name], DO Primary Gastroenterologist:  [facility]  Date of Admission: 10/19/20 Date of Consultation: 10/20/20  Reason for Consultation: Anemia, heme positive stool  HPI:  Stacey Reyes is a 75 y.o. year old female with history of hypertrophic cardiomyopathy, HTN, HLD, IDA likely secondary to [facility] with extensive GI evaluation in 2020 and 2021 detailed below, previously requiring PRBCs and now following with oncology for IV iron infusions who presented to the emergency room due to 1 week of exertional weakness, fatigue, shortness of breath, chest tightness, as well as dark tarry stools.  She was initially seen outpatient by her primary care provider between 7/27-7/28.  BNP was also elevated at 2174.  Subsequently, PCP recommended ED evaluation.  ED Course:  Initial BP soft at 91/58, otherwise hemodynamically stable.  O2 saturation 96+ percent on room air. Hemoglobin 9.1, down from 11.1 on 7/27.  FOBT positive.  WBC and platelets within normal limits. Electrolytes and kidney function within normal limits.  BUN normal at 19. Lipase normal. INR 1.0 Troponin elevated at 159>> 175>> 153 COVID-negative Chest x-ray with no acute disease. Cardiology was consulted and recommended trending troponin, transfer to [facility] if troponin dramatically increasing or if patient develops chest pain.  She started on IV Protonix and given IV fluids.  Today: Patient reports over the last week she has had increased shortness of breath with associated chest pain with little exertion.  Symptoms resolved with rest.  No symptoms at rest.  Also noted her stools became dark over the last 1-2 weeks.  Initially, she thought this was secondary to blueberries; however, after she stopped eating blueberries, her stools continue to be dark and is tarry.  Last dark stool was yesterday morning.  Denies abdominal pain, nausea, vomiting,  history of reflux, or dysphagia.  She has been on Protonix 40 mg daily due to history of IDA secondary to GI blood loss.  No unintentional weight loss.  Has good appetite.  She has noticed some left back/flank pain with activity, resolves with rest.  States this is also common symptom when she has a decline in her hemoglobin.  Denies lightheadedness, dizziness, weakness.  States she noticed her blood pressure was fluctuating all over the place yesterday.  Systolic blood pressure as low as upper 80s which is why she went to see her primary care provider.  No NSAIDs.  Had eczema breakout on her legs in early July. She was started on Prednisone course, 2 weeks. Last dose was 2 weeks ago.    Prior GI evaluation: EGD August 2020: Duodenal erosions biopsied (benign), otherwise normal exam.  Colonoscopy August 2020: 2 polyps removed (tubular adenomas), diverticulosis in the sigmoid, descending, transverse colon, otherwise normal exam. EGD August 2021: Normal esophagus, small hiatal hernia, innocent appearing duodenal erosions. Small bowel capsule August 2021: Benign duodenal erosions without active bleeding and is appearing small bowel polypoid-like lesion without bleeding, large amounts of old blood in cecum with no obvious source. Colonoscopy November 2021: 7 polyps removed (tubular adenomas), diverticulosis in sigmoid, descending, transverse, and descending colon, erythema and petechia in association with diverticulosis in the left colon biopsies (benign), external and internal hemorrhoids. Givens capsule November 2021: Duodenitis, 1 small AVM in proximal small bowel about 8 minutes from first duodenal image, 1 tiny AVM at 1 hour 19 minutes, numerous tiny lesions versus luminal contents of unclear significance and distal small bowel.  Past Medical History:  Diagnosis Date  Anemia    Cataract    removed bilateral   Chronic cystitis    Diverticulitis    Heart murmur    Hyperlipidemia     Hypertension    Personal history of colonic polyps-adenomas 01/07/2012   2009 - 2 diminutive adenomas (prior polyps also) 01/07/2012 - 2 diminutive adenomas      Past Surgical History:  Procedure Laterality Date   BREAST BIOPSY Right    No Scar seen    COLONOSCOPY  multiple   ESOPHAGOGASTRODUODENOSCOPY (EGD) WITH PROPOFOL N/A 11/23/2019   Procedure: ESOPHAGOGASTRODUODENOSCOPY (EGD) WITH PROPOFOL;  Surgeon: Daneil Dolin, MD;  [facility];  Service: Endoscopy;  Laterality: N/A;   GIVENS CAPSULE STUDY N/A 11/23/2019   Procedure: GIVENS CAPSULE STUDY;  Surgeon: Daneil Dolin, MD;  Location: [facility];  Service: Endoscopy;  Laterality: N/A;    Prior to Admission medications   Medication Sig Start Date End Date Taking? Authorizing Provider  Ascorbic Acid (VITAMIN C) 1000 MG tablet Take 1,000 mg by mouth daily.   Yes [provider]  bisoprolol (ZEBETA) 5 MG tablet Take 2.5 mg by mouth daily. 01/26/20  Yes [provider]  Copper Gluconate (COPPER CAPS PO) Take 1 capsule by mouth daily.    Yes [provider]  cyanocobalamin ([employer] VITAMIN B12) 2000 MCG tablet Take 1 tablet (2,000 mcg total) by mouth daily. 03/25/19  Yes Johnson, Clanford L, MD  ergocalciferol (VITAMIN D2) 1.25 MG (50000 UT) capsule Take 1 capsule (50,000 Units total) by mouth once a week. 09/27/20  Yes Pennington, Rebekah M, PA-C  Magnesium 200 MG TABS Take 1 tablet (200 mg total) by mouth daily. 03/23/19  Yes Lendon Colonel, NP  Multiple Minerals-Vitamins (CALCIUM-MAGNESIUM-ZINC-D3 PO) Take 1 tablet by mouth daily.   Yes [provider]  Multiple Vitamin (MULTIVITAMIN) capsule Take 1 capsule by mouth daily.    Yes [provider]  nitrofurantoin (MACRODANTIN) 100 MG capsule Take 100 mg by mouth daily.   Yes [provider]  pantoprazole (PROTONIX) 40 MG tablet Take 1 tablet (40 mg total) by mouth daily. (NEEDS TO BE SEEN BEFORE NEXT REFILL) 08/18/20  Yes  Gottschalk, Ashly M, DO  rosuvastatin (CRESTOR) 10 MG tablet TAKE 1 TABLET BY MOUTH AT  BEDTIME 04/12/20  Yes Gottschalk, Ashly M, DO  vitamin E 400 UNIT capsule Take 400 Units by mouth daily.    Yes [provider]    Current Facility-Administered Medications  Medication Dose Route Frequency Provider Last Rate Last Admin   pantoprazole (PROTONIX) injection 40 mg  40 mg Intravenous Q12H Adefeso, Oladapo, DO       Current Outpatient Medications  Medication Sig Dispense Refill   Ascorbic Acid (VITAMIN C) 1000 MG tablet Take 1,000 mg by mouth daily.     bisoprolol (ZEBETA) 5 MG tablet Take 2.5 mg by mouth daily.     Copper Gluconate (COPPER CAPS PO) Take 1 capsule by mouth daily.      cyanocobalamin ([employer] VITAMIN B12) 2000 MCG tablet Take 1 tablet (2,000 mcg total) by mouth daily.     ergocalciferol (VITAMIN D2) 1.25 MG (50000 UT) capsule Take 1 capsule (50,000 Units total) by mouth once a week. 16 capsule 3   Magnesium 200 MG TABS Take 1 tablet (200 mg total) by mouth daily. 30 tablet 6   Multiple Minerals-Vitamins (CALCIUM-MAGNESIUM-ZINC-D3 PO) Take 1 tablet by mouth daily.     Multiple Vitamin (MULTIVITAMIN) capsule Take 1 capsule by mouth daily.  nitrofurantoin (MACRODANTIN) 100 MG capsule Take 100 mg by mouth daily.     pantoprazole (PROTONIX) 40 MG tablet Take 1 tablet (40 mg total) by mouth daily. (NEEDS TO BE SEEN BEFORE NEXT REFILL) 90 tablet 0   rosuvastatin (CRESTOR) 10 MG tablet TAKE 1 TABLET BY MOUTH AT  BEDTIME 90 tablet 3   vitamin E 400 UNIT capsule Take 400 Units by mouth daily.       Allergies as of 10/19/2020 - Review Complete 10/19/2020  Allergen Reaction Noted   Sulfa antibiotics Rash 04/29/2019    Family History  Problem Relation Age of Onset   Colon cancer Mother 50       80's   Breast cancer Sister    Asthma Brother    Colon polyps Neg Hx    Esophageal cancer Neg Hx    Rectal cancer Neg Hx    Stomach cancer Neg Hx     Social History    Socioeconomic History   Marital status: Married    Spouse name: Not on file   Number of children: 1   Years of education: Not on file   Highest education level: Not on file  Occupational History   Occupation: retired  Tobacco Use   Smoking status: Former    Types: Cigarettes    Quit date: 12/23/1984    Years since quitting: 35.8   Smokeless tobacco: Never  Vaping Use   Vaping Use: Never used  Substance and Sexual Activity   Alcohol use: Yes    Comment: rare   Drug use: No   Sexual activity: Not on file  Other Topics Concern   Not on file  Social History Narrative   Patient is married and retired and has 1 grown child   Social Determinants of Radio broadcast assistant Strain: Not on file  Food Insecurity: Not on file  Transportation Needs: Not on file  Physical Activity: Not on file  Stress: Not on file  Social Connections: Not on file  Intimate Partner Violence: Not on file    Review of Systems: Gen: Denies fever, chills, cold or flulike symptoms, presyncope, syncope. CV: See HPI Resp: See HPI GI: See HPI GU : Denies urinary burning, urinary frequency, urinary incontinence.  MS: Denies joint pain Derm: See HPI Psych: Denies depression, anxiety Heme: See HPI  Physical Exam: Vital signs in last 24 hours: Temp:  [97.4 F (36.3 C)-98.5 F (36.9 C)] 98.5 F (36.9 C) (07/28 1818) Pulse Rate:  [75-91] 80 (07/29 0430) Resp:  [9-22] 13 (07/29 0430) BP: (91-130)/(47-69) 116/54 (07/29 0430) SpO2:  [94 %-99 %] 97 % (07/29 0430) FiO2 (%):  [21 %] 21 % (07/29 0353) Weight:  [90 kg-90.7 kg] 90 kg (07/28 1844)   General:   Alert,  Well-developed, well-nourished, pleasant and cooperative in NAD Head:  Normocephalic and atraumatic. Eyes:  Sclera clear, no icterus.   Conjunctiva pink. Ears:  Normal auditory acuity. Lungs:  Clear throughout to auscultation.   No wheezes, crackles, or rhonchi. No acute distress. Heart:  Regular rate and rhythm. Systolic murmur  appreciated.  Abdomen:  Soft, nontender and nondistended. No masses, hepatosplenomegaly or hernias noted. Normal bowel sounds, without guarding, and without rebound.   Rectal:  Deferred  Msk:  Symmetrical without gross deformities. Normal posture. Extremities:  Without edema. Neurologic:  Alert and  oriented x4;  grossly normal neurologically. Skin:  Intact without significant lesions or rashes. Psych:  Normal mood and affect.  Intake/Output from previous day: 07/28 0701 -  07/29 0700 In: 600 [IV Piggyback:600] Out: -  Intake/Output this shift: No intake/output data recorded.  Lab Results: Recent Labs    10/19/20 1244 10/19/20 1919 10/20/20 0439  WBC 7.1 6.6 5.9  HGB 9.7* 9.1* 8.2*  HCT 28.6* 27.7* 24.4*  PLT 167 160 142*   BMET Recent Labs    10/19/20 1244 10/19/20 1919 10/20/20 0439  NA 139 135 138  K 4.2 3.7 3.7  CL 103 104 108  CO2 '24 26 25  '$ GLUCOSE 129* 106* 108*  BUN '16 19 20  '$ CREATININE 0.59 0.54 0.61  CALCIUM 8.8 8.4* 8.2*   LFT Recent Labs    10/19/20 1244 10/19/20 1919 10/20/20 0439  PROT 5.5* 5.7* 5.0*  ALBUMIN 3.9 3.6 3.1*  AST 34 35 32  ALT 30 33 30  ALKPHOS 33* 28* 21*  BILITOT 0.2 0.4 0.8   PT/INR Recent Labs    10/19/20 1919 10/20/20 0439  LABPROT 13.4 14.2  INR 1.0 1.1    Studies/Results: DG Chest 2 View  Result Date: 10/19/2020 CLINICAL DATA:  Dyspnea on exertion. EXAM: CHEST - 2 VIEW COMPARISON:  Single-view of the chest 12/12/2019. CT chest 03/14/2020. FINDINGS: A small nodular opacity in the right mid lung on the PA view only correlates with a nodule seen on the prior CT and is unchanged. Additional small nodules seen on CT are not visible on this exam. No consolidative process, pneumothorax or effusion. There is cardiomegaly. Aortic atherosclerosis. No acute or focal bony abnormality. IMPRESSION: No acute disease. Cardiomegaly. Aortic Atherosclerosis (ICD10-I70.0). Electronically Signed   By: Inge Rise M.D.   On:  10/19/2020 12:46    Impression: 75 year old female with history of hypertrophic cardiomyopathy, HTN, HLD, IDA with extensive GI evaluation in 2020 in 2021, found to have colon polyps, duodenitis, and small bowel AVMs as detailed above, previously requiring PRBCs and now following with oncology for IV iron infusions who presented to the emergency room due to 1 week of exertional dyspnea with associated chest tightness as well as dark tarry stools.  Initial hemoglobin was 9.1, down from 11.1 on 7/27, FOBT positive.  Troponin elevated at 159.  Outpatient lab with BNP elevated at 2174.  Chest x-ray with no acute disease.  Cardiology and GI were consulted.    Symptomatic acute on chronic anemia with melena and heme positive stool: Hemoglobin 9.1 on admission, down from 11.1 on 7/27.  Hemoglobin down to 8.2 this morning.  Dark tarry stools x1 week.  Chronically on PPI daily.  Denies NSAIDs but recently completed 2-week course of prednisone due to eczema outbreak.  Known history of duodenitis and small bowel AVMs as detailed in HPI.  Acute decline possibly secondary to bleeding AVMs versus gastritis, duodenitis, PUD in the setting of prednisone course. With normal BUN, can't rule out right sided colonic lesion.   Would recommend starting with EGD with possible push enteroscopy for further evaluation.  However, with elevated troponin, history of hypertrophic cardiomyopathy and elevated BNP, would like cardiology input/clearance prior to procedures.  Overall, suspect demand ischemia in the setting of symptomatic anemia.  Encouragingly, patient has no chest pain at rest.  Plan: Continue IV PPI twice daily. Would recommend EGD with possible push enteroscopy for further evaluation of acute on chronic anemia, melena, heme positive stool pending cardiology evaluation/clearance. Keep n.p.o. for now. May have clear liquids if unable to complete procedures today.    LOS: 0 days    10/20/2020, 7:16 AM   Aliene Altes, [facility]

## 2020-10-20 NOTE — Anesthesia Preprocedure Evaluation (Signed)
Anesthesia Evaluation  Patient identified by MRN, date of birth, ID band Patient awake    Reviewed: Allergy & Precautions, H&P , NPO status , Patient's Chart, lab work & pertinent test results, reviewed documented beta blocker date and time   Airway Mallampati: II  TM Distance: >3 FB Neck ROM: full    Dental no notable dental hx.    Pulmonary neg pulmonary ROS, former smoker,    Pulmonary exam normal breath sounds clear to auscultation       Cardiovascular Exercise Tolerance: Good hypertension, + Valvular Problems/Murmurs MR  Rhythm:regular Rate:Normal     Neuro/Psych  Headaches, negative psych ROS   GI/Hepatic negative GI ROS, Neg liver ROS,   Endo/Other  negative endocrine ROS  Renal/GU negative Renal ROS  negative genitourinary   Musculoskeletal   Abdominal   Peds  Hematology  (+) Blood dyscrasia, anemia ,   Anesthesia Other Findings D/W Dr. Harl Bowie Cardiology - OK to proceed without further cardiac testing.  Reproductive/Obstetrics negative OB ROS                             Anesthesia Physical Anesthesia Plan  ASA: 2  Anesthesia Plan: General   Post-op Pain Management:    Induction:   PONV Risk Score and Plan: Propofol infusion  Airway Management Planned:   Additional Equipment:   Intra-op Plan:   Post-operative Plan:   Informed Consent: I have reviewed the patients History and Physical, chart, labs and discussed the procedure including the risks, benefits and alternatives for the proposed anesthesia with the patient or authorized representative who has indicated his/her understanding and acceptance.     Dental Advisory Given  Plan Discussed with: CRNA  Anesthesia Plan Comments:         Anesthesia Quick Evaluation

## 2020-10-20 NOTE — Progress Notes (Signed)
Patient Demographics:    Stacey Reyes, is a 75 y.o. female, DOB - [birth year], [id number]  Admit date - 10/19/2020   Admitting Physician Ginger Leeth Denton Brick, MD  Outpatient Primary MD for the patient is [name], DO  LOS - 0   Chief Complaint  Patient presents with   Shortness of Breath        Subjective:    Stacey Reyes today has no fevers, no emesis,  No chest pain,  -   Assessment  & Plan :    Principal Problem:   Symptomatic anemia Active Problems:   Hypertension, essential   Hyperlipidemia   Elevated troponin   GI bleed  Brief Summary:-  75 y.o. year old female with history of hypertrophic cardiomyopathy/ HOCM/severe LVH and evidence of SAM,, HTN, HLD, B12 deficiency and IDA likely secondary to [facility] with extensive GI evaluation in 2020 and 2021 with capsule endoscopy revealing small bowel AVMs, patient requires transfusion of PRBC iron infusions from time to time, readmitted on 10/19/2020 with black tarry stools  A/p  1) acute on chronic symptomatic anemia---due to Gi Bleed (ABLA) -suspect secondary to previously known AVMs -GI consult appreciated -EGD on 10/20/2020 with not very revealing -Continue to monitor and transfuse as clinically indicated  2) elevated troponin--- in the setting of GI bleed  -Doubt ACS -Cardiology consult appreciated, neurology consult appreciated continue bisoprolol  3)HTN/HLD--- continue bisoprolol and Crestor  4)hypertrophic cardiomyopathy/ HOCM/severe LVH and evidence of SAM--- cardiology consult appreciated continue bisoprolol  Disposition/Need for in-Hospital Stay- patient unable to be discharged at this time due to -acute GI bleed elevated troponins requiring further monitoring, and GI and cardiology input*  Status is: Inpatient  Remains inpatient appropriate because: Please see disposition above  Disposition: The patient is from:  Home              Anticipated d/c is to: Home              Anticipated d/c date is: 1 day              Patient currently is not medically stable to d/c. Barriers: Not Clinically Stable-   Code Status :  -  Code Status: DNR   Family Communication:    NA (patient is alert, awake and coherent)   Consults  :  Gi/Card  DVT Prophylaxis  :   - SCDs  SCDs Start: 10/20/20 0353    Lab Results  Component Value Date   PLT 142 (L) 10/20/2020    Inpatient Medications  Scheduled Meds:  pantoprazole (PROTONIX) IV  40 mg Intravenous Q12H   Continuous Infusions: PRN Meds:.    Anti-infectives (From admission, onward)    None         Objective:   Vitals:   10/20/20 1251 10/20/20 1258 10/20/20 1300 10/20/20 1410  BP: (!) 73/46 (!) 106/51 (!) 106/46 116/74  Pulse: 88 83 83 86  Resp: '17 20 18   '$ Temp:    98.7 F (37.1 C)  TempSrc:    Oral  SpO2: 97%  95% 95%  Weight:      Height:        Wt Readings from Last 3 Encounters:  10/19/20 90 kg  10/19/20 90.7 kg  08/02/20  89.8 kg     Intake/Output Summary (Last 24 hours) at 10/20/2020 1759 Last data filed at 10/20/2020 1242 Gross per 24 hour  Intake 750 ml  Output 0 ml  Net 750 ml     Physical Exam  Gen:- Awake Alert,  in no apparent distress  HEENT:- [place].AT, No sclera icterus Neck-Supple Neck,No JVD,.  Lungs-  CTAB , fair symmetrical air movement CV- S1, S2 normal, regular 3/6 SM Abd-  +ve B.Sounds, Abd Soft, No tenderness,    Extremity/Skin:- No  edema, pedal pulses present  Psych-affect is appropriate, oriented x3 Neuro-no new focal deficits, no tremors   Data Review:   Micro Results Recent Results (from the past 240 hour(s))  Resp Panel by RT-PCR (Flu A&B, Covid) Nasopharyngeal Swab     Status: None   Collection Time: 10/19/20 10:02 PM   Specimen: Nasopharyngeal Swab; Nasopharyngeal(NP) swabs in vial transport medium  Result Value Ref Range Status   SARS Coronavirus 2 by RT PCR NEGATIVE NEGATIVE Final     Comment: (NOTE) SARS-CoV-2 target nucleic acids are NOT DETECTED.  The SARS-CoV-2 RNA is generally detectable in upper respiratory specimens during the acute phase of infection. The lowest concentration of SARS-CoV-2 viral copies this assay can detect is 138 copies/mL. A negative result does not preclude SARS-Cov-2 infection and should not be used as the sole basis for treatment or other patient management decisions. A negative result may occur with  improper specimen collection/handling, submission of specimen other than nasopharyngeal swab, presence of viral mutation(s) within the areas targeted by this assay, and inadequate number of viral copies(<138 copies/mL). A negative result must be combined with clinical observations, patient history, and epidemiological information. The expected result is Negative.  Fact Sheet for Patients:  EntrepreneurPulse.com.au  Fact Sheet for Healthcare Providers:  IncredibleEmployment.be  This test is no t yet approved or cleared by the [place] FDA and  has been authorized for detection and/or diagnosis of SARS-CoV-2 by FDA under an Emergency Use Authorization (EUA). This EUA will remain  in effect (meaning this test can be used) for the duration of the COVID-19 declaration under Section 564(b)(1) of the Act, 21 U.S.C.section 360bbb-3(b)(1), unless the authorization is terminated  or revoked sooner.       Influenza A by PCR NEGATIVE NEGATIVE Final   Influenza B by PCR NEGATIVE NEGATIVE Final    Comment: (NOTE) The Xpert Xpress SARS-CoV-2/FLU/RSV plus assay is intended as an aid in the diagnosis of influenza from Nasopharyngeal swab specimens and should not be used as a sole basis for treatment. Nasal washings and aspirates are unacceptable for Xpert Xpress SARS-CoV-2/FLU/RSV testing.  Fact Sheet for Patients: EntrepreneurPulse.com.au  Fact Sheet for Healthcare  Providers: IncredibleEmployment.be  This test is not yet approved or cleared by the [place] FDA and has been authorized for detection and/or diagnosis of SARS-CoV-2 by FDA under an Emergency Use Authorization (EUA). This EUA will remain in effect (meaning this test can be used) for the duration of the COVID-19 declaration under Section 564(b)(1) of the Act, 21 U.S.C. section 360bbb-3(b)(1), unless the authorization is terminated or revoked.  Performed at [facility], [address] [place], [place] [address]     Radiology Reports DG Chest 2 View  Result Date: 10/19/2020 CLINICAL DATA:  Dyspnea on exertion. EXAM: CHEST - 2 VIEW COMPARISON:  Single-view of the chest 12/12/2019. CT chest 03/14/2020. FINDINGS: A small nodular opacity in the right mid lung on the PA view only correlates with a nodule seen on  the prior CT and is unchanged. Additional small nodules seen on CT are not visible on this exam. No consolidative process, pneumothorax or effusion. There is cardiomegaly. Aortic atherosclerosis. No acute or focal bony abnormality. IMPRESSION: No acute disease. Cardiomegaly. Aortic Atherosclerosis (ICD10-I70.0). Electronically Signed   By: Inge Rise M.D.   On: 10/19/2020 12:46     CBC Recent Labs  Lab 10/19/20 1244 10/19/20 1919 10/20/20 0439  WBC 7.1 6.6 5.9  HGB 9.7* 9.1* 8.2*  HCT 28.6* 27.7* 24.4*  PLT 167 160 142*  MCV 92 95.8 94.6  MCH 31.3 31.5 31.8  MCHC 33.9 32.9 33.6  RDW 12.9 13.3 13.5  LYMPHSABS 2.0 2.2  --   MONOABS  --  0.7  --   EOSABS 0.3 0.3  --   BASOSABS 0.1 0.1  --     Chemistries  Recent Labs  Lab 10/19/20 1244 10/19/20 1919 10/20/20 0439  NA 139 135 138  K 4.2 3.7 3.7  CL 103 104 108  CO2 '24 26 25  '$ GLUCOSE 129* 106* 108*  BUN '16 19 20  '$ CREATININE 0.59 0.54 0.61  CALCIUM 8.8 8.4* 8.2*  MG  --   --  2.0  AST 34 35 32  ALT 30 33 30  ALKPHOS 33* 28* 21*  BILITOT 0.2 0.4 0.8    ------------------------------------------------------------------------------------------------------------------ No results for input(s): CHOL, HDL, LDLCALC, TRIG, CHOLHDL, LDLDIRECT in the last 72 hours.  Lab Results  Component Value Date   HGBA1C 5.6 02/24/2015   ------------------------------------------------------------------------------------------------------------------ No results for input(s): TSH, T4TOTAL, T3FREE, THYROIDAB in the last 72 hours.  Invalid input(s): FREET3 ------------------------------------------------------------------------------------------------------------------ Recent Labs    10/19/20 1244  VITAMINB12 920  FOLATE 12.9  FERRITIN 304*  TIBC 265  IRON 82  RETICCTPCT 2.7*    Coagulation profile Recent Labs  Lab 10/19/20 1919 10/20/20 0439  INR 1.0 1.1    No results for input(s): DDIMER in the last 72 hours.  Cardiac Enzymes No results for input(s): CKMB, TROPONINI, MYOGLOBIN in the last 168 hours.  Invalid input(s): CK ------------------------------------------------------------------------------------------------------------------    Component Value Date/Time   BNP 239.0 (H) 11/22/2019 1112     Roxan Hockey M.D on 10/20/2020 at 5:59 PM  Go to [email or web address] - for contact info  [facility] - Office  [phone]

## 2020-10-20 NOTE — Consult Note (Addendum)
Cardiology Consultation:   Patient ID: Stacey Reyes MRN: [id number]; DOB: 1946-03-22  Admit date: 10/19/2020 Date of Consult: 10/20/2020  PCP:  [name], DO   [facility] Providers Cardiologist:  Minus Breeding, MD        Patient Profile:   Stacey Reyes is a 75 y.o. female with a hx of SVT, murmur, severe LVH and evidence of SAM, anemias, AVMs, B12 def, who is being seen 10/20/2020 for the evaluation of pre-op for EGD  at the request of  Dr. Denton Brick.  History of Present Illness:   [name] with hx as above, SVT, no a fib found on monitor, severe LVH and cardiac MRI with asymmetric septal hypertrophy. PYP scan with no evidence of amyloid.  In the past she has had several episodes of blood loss anemia and with those dyspnea.  On last visit with Dr. Percival Spanish in 12/2019 she had less SOB if she had iron transfusions.  She had been taken off BP meds due to hypotension.   Pt presented to ER sent by PCP for symptoms similar to prior anemia. DOE.  Symptoms began 1 week ago. First black stool thought due to diet but then stools stayed black.  She became more SOB usually with ambulation.  She could not walk 10-20 feet without having to stop and associated with that was chest pressure and bilateral arm discomfort, with rest pain and dyspnea resolved.  At rest no chest pain but her breathing was wheezy, did not need to raise her head to sleep.  In ER pro BNP 2974, hs Troponin 159, 175 and today 153.   Stool for occult blood +  Hgb 9.1 WBC 6.6 plts 160.  Today hgb 8.2, plts 142  INR 1.1  Na 138, K+ 3.7 CL 108 C02 25 Cr 0.61 CA+ 8.2  Mg+ 2.0   EKG:  The EKG was personally reviewed and demonstrates:  SR with LVH though deeper T wave inversion inf.  Lat T wave inversions similar to old.   Telemetry:  Telemetry was personally reviewed and demonstrates:  SR  Currently BP 112/62 P 84 R 14, at rest she feels comfortable.   Past Medical History:  Diagnosis Date   Anemia     Cataract    removed bilateral   Chronic cystitis    Diverticulitis    Heart murmur    Hyperlipidemia    Hypertension    Personal history of colonic polyps-adenomas 01/07/2012   2009 - 2 diminutive adenomas (prior polyps also) 01/07/2012 - 2 diminutive adenomas      Past Surgical History:  Procedure Laterality Date   BREAST BIOPSY Right    No Scar seen    COLONOSCOPY  multiple   ESOPHAGOGASTRODUODENOSCOPY (EGD) WITH PROPOFOL N/A 11/23/2019   Procedure: ESOPHAGOGASTRODUODENOSCOPY (EGD) WITH PROPOFOL;  Surgeon: Daneil Dolin, MD;  [facility];  Service: Endoscopy;  Laterality: N/A;   GIVENS CAPSULE STUDY N/A 11/23/2019   Procedure: GIVENS CAPSULE STUDY;  Surgeon: Daneil Dolin, MD;  Location: [facility];  Service: Endoscopy;  Laterality: N/A;     Home Medications:  Prior to Admission medications   Medication Sig Start Date End Date Taking? Authorizing Provider  Ascorbic Acid (VITAMIN C) 1000 MG tablet Take 1,000 mg by mouth daily.   Yes [provider]  bisoprolol (ZEBETA) 5 MG tablet Take 2.5 mg by mouth daily. 01/26/20  Yes [provider]  Copper Gluconate (COPPER CAPS PO) Take 1 capsule by mouth daily.  Yes [provider]  cyanocobalamin ([employer] VITAMIN B12) 2000 MCG tablet Take 1 tablet (2,000 mcg total) by mouth daily. 03/25/19  Yes Johnson, Clanford L, MD  ergocalciferol (VITAMIN D2) 1.25 MG (50000 UT) capsule Take 1 capsule (50,000 Units total) by mouth once a week. 09/27/20  Yes Pennington, Rebekah M, PA-C  Magnesium 200 MG TABS Take 1 tablet (200 mg total) by mouth daily. 03/23/19  Yes Lendon Colonel, NP  Multiple Minerals-Vitamins (CALCIUM-MAGNESIUM-ZINC-D3 PO) Take 1 tablet by mouth daily.   Yes [provider]  Multiple Vitamin (MULTIVITAMIN) capsule Take 1 capsule by mouth daily.    Yes [provider]  nitrofurantoin (MACRODANTIN) 100 MG capsule Take 100 mg by mouth daily.   Yes [provider]   pantoprazole (PROTONIX) 40 MG tablet Take 1 tablet (40 mg total) by mouth daily. (NEEDS TO BE SEEN BEFORE NEXT REFILL) 08/18/20  Yes Gottschalk, Ashly M, DO  rosuvastatin (CRESTOR) 10 MG tablet TAKE 1 TABLET BY MOUTH AT  BEDTIME 04/12/20  Yes Gottschalk, Ashly M, DO  vitamin E 400 UNIT capsule Take 400 Units by mouth daily.    Yes [provider]    Inpatient Medications: Scheduled Meds:  pantoprazole (PROTONIX) IV  40 mg Intravenous Q12H   Continuous Infusions:  PRN Meds:   Allergies:    Allergies  Allergen Reactions   Sulfa Antibiotics Rash    Social History:   Social History   Socioeconomic History   Marital status: Married    Spouse name: Not on file   Number of children: 1   Years of education: Not on file   Highest education level: Not on file  Occupational History   Occupation: retired  Tobacco Use   Smoking status: Former    Types: Cigarettes    Quit date: 12/23/1984    Years since quitting: 35.8   Smokeless tobacco: Never  Vaping Use   Vaping Use: Never used  Substance and Sexual Activity   Alcohol use: Yes    Comment: rare   Drug use: No   Sexual activity: Not on file  Other Topics Concern   Not on file  Social History Narrative   Patient is married and retired and has 1 grown child   Social Determinants of Radio broadcast assistant Strain: Not on file  Food Insecurity: Not on file  Transportation Needs: Not on file  Physical Activity: Not on file  Stress: Not on file  Social Connections: Not on file  Intimate Partner Violence: Not on file    Family History:    Family History  Problem Relation Age of Onset   Colon cancer Mother 43       80's   Breast cancer Sister    Asthma Brother    Colon polyps Neg Hx    Esophageal cancer Neg Hx    Rectal cancer Neg Hx    Stomach cancer Neg Hx      ROS:  Please see the history of present illness.  General:no colds or fevers, no weight changes Skin:no rashes or ulcers HEENT:no blurred  vision, no congestion CV:see HPI PUL:see HPI GI:no diarrhea constipation or melena, no indigestion GU:no hematuria, no dysuria MS:no joint pain, no claudication Neuro:no syncope, no lightheadedness Endo:no diabetes, no thyroid disease  All other ROS reviewed and negative.     Physical Exam/Data:   Vitals:   10/20/20 0200 10/20/20 0353 10/20/20 0430 10/20/20 0700  BP: (!) 119/52  (!) 116/54 123/69  Pulse: 77  80 81  Resp: '15  13 12  '$ Temp:      TempSrc:      SpO2: 97% 97% 97% 97%  Weight:      Height:        Intake/Output Summary (Last 24 hours) at 10/20/2020 0958 Last data filed at 10/19/2020 2015 Gross per 24 hour  Intake 600 ml  Output --  Net 600 ml   Last 3 Weights 10/19/2020 10/19/2020 08/02/2020  Weight (lbs) 198 lb 6.6 oz 200 lb 198 lb  Weight (kg) 90 kg 90.719 kg 89.812 kg     Body mass index is 37.49 kg/m.  General:  Well nourished, well developed, in no acute distress at rest HEENT: normal Lymph: no adenopathy Neck: no JVD Endocrine:  No thryomegaly Vascular: No carotid bruits; pedal pulses 2+ bilaterally  Cardiac:  normal S1, S2; RRR; 2-3/6  murmur no gallup or rub Lungs:  clear to auscultation bilaterally, no wheezing, rhonchi or rales  Abd: soft, nontender, no hepatomegaly  Ext: no edema Musculoskeletal:  No deformities, BUE and BLE strength normal and equal Skin: warm and dry  Neuro:  alert and oriented X 3 MAE follows commands, no focal abnormalities noted Psych:  Normal affect   Relevant CV Studies: Cardiac MRI 07/2019  IMPRESSION: 1.  No evidence of cardiac amyloidosis   2. Asymmetric hypertrophy measuring up to 80m in basal septum (133min posterior wall), consistent with hypertrophic cardiomyopathy   3. LVOT obstruction due to systolic anterior motion of the anterior leaflet of the mitral valve, consistent with HOCM   4.  No late gadolinium enhancement to suggest focal myocardial scar   5.  Normal LV size with hyperdynamic systolic  function (EF [id number]  6.  Normal RV size and systolic function (EF [id number] CTA of chest in 2020 with Coronary, aortic arch, and Jousha Schwandt vessel atherosclerotic vascular disease    ECHO 03/24/2019  IMPRESSIONS     1. Left ventricular ejection fraction, by visual estimation, is >75%. The  left ventricle has hyperdynamic function. There is severely increased left  ventricular hypertrophy.   2. Elevated left atrial pressure.   3. Left ventricular diastolic parameters are consistent with Grade I  diastolic dysfunction (impaired relaxation).   4. The left ventricle has no regional wall motion abnormalities.   5. There is a dynamic LVOT gradient in the setting of severe symmetricla  hypertrophy and hyperdynamic LV function leading to [facility] the anterior  mitral valve leaflet. Difficult to discern the peak graident,  approximately appears to be 20 mmHg with  Valsalva.   6. Global right ventricle has normal systolic function.The right  ventricular size is normal. No increase in right ventricular wall  thickness.   7. Left atrial size was severely dilated.   8. Right atrial size was normal.   9. The mitral valve is normal in structure. Mild mitral valve  regurgitation. No evidence of mitral stenosis.  10. The tricuspid valve is normal in structure.  11. The aortic valve is tricuspid. Aortic valve regurgitation is not  visualized. Mild aortic valve stenosis.  12. The pulmonic valve was not well visualized. Pulmonic valve  regurgitation is not visualized.  13. The inferior vena cava is normal in size with greater than 50%  respiratory variability, suggesting right atrial pressure of 3 mmHg.   FINDINGS   Left Ventricle: Left ventricular ejection fraction, by visual estimation,  is >75%. The left ventricle has hyperdynamic function. The left ventricle  has no regional  wall motion abnormalities. There is severely increased  left ventricular hypertrophy. Left   ventricular diastolic parameters  are consistent with Grade I diastolic  dysfunction (impaired relaxation). Elevated left atrial pressure. There is  a dynamic LVOT gradient in the setting of severe symmetricla hypertrophy  and hyperdynamic LV function  leading to [facility] of the anterior mitral valve leaflet. Difficult to discern  the peak graident, approximately appears to be 20 mmHg with Valsalva.   Right Ventricle: The right ventricular size is normal. No increase in  right ventricular wall thickness. Global RV systolic function is has  normal systolic function.   Left Atrium: Left atrial size was severely dilated.   Right Atrium: Right atrial size was normal in size   Pericardium: There is no evidence of pericardial effusion.   Mitral Valve: The mitral valve is normal in structure. Mild mitral valve  regurgitation. No evidence of mitral valve stenosis by observation. MV  peak gradient, 18.8 mmHg.   Tricuspid Valve: The tricuspid valve is normal in structure. Tricuspid  valve regurgitation is not demonstrated.   Aortic Valve: The aortic valve is tricuspid. . There is mild thickening  and mild calcification of the aortic valve. Aortic valve regurgitation is  not visualized. Mild aortic stenosis is present. Mild aortic valve annular  calcification. There is mild  thickening of the aortic valve. There is mild calcification of the aortic  valve.   Pulmonic Valve: The pulmonic valve was not well visualized. Pulmonic valve  regurgitation is not visualized. Pulmonic regurgitation is not visualized.  No evidence of pulmonic stenosis.   Aorta: The aortic root is normal in size and structure.   Pulmonary Artery: Indeterminant PASP, inadequate TR jet.   Venous: The inferior vena cava is normal in size with greater than 50%  respiratory variability, suggesting right atrial pressure of 3 mmHg.   IAS/Shunts: No atrial level shunt detected by color flow Doppler.      Laboratory Data:  High Sensitivity Troponin:    Recent Labs  Lab 10/19/20 1919 10/19/20 2124 10/20/20 0439 10/20/20 0809  TROPONINIHS 159* 175* 153* 135*     Chemistry Recent Labs  Lab 10/19/20 1244 10/19/20 1919 10/20/20 0439  NA 139 135 138  K 4.2 3.7 3.7  CL 103 104 108  CO2 '24 26 25  '$ GLUCOSE 129* 106* 108*  BUN '16 19 20  '$ CREATININE 0.59 0.54 0.61  CALCIUM 8.8 8.4* 8.2*  GFRNONAA  --  >60 >60  ANIONGAP  --  5 5    Recent Labs  Lab 10/19/20 1244 10/19/20 1919 10/20/20 0439  PROT 5.5* 5.7* 5.0*  ALBUMIN 3.9 3.6 3.1*  AST 34 35 32  ALT 30 33 30  ALKPHOS 33* 28* 21*  BILITOT 0.2 0.4 0.8   Hematology Recent Labs  Lab 10/19/20 1244 10/19/20 1919 10/20/20 0439  WBC 7.1 6.6 5.9  RBC 3.10* 2.89* 2.58*  HGB 9.7* 9.1* 8.2*  HCT 28.6* 27.7* 24.4*  MCV 92 95.8 94.6  MCH 31.3 31.5 31.8  MCHC 33.9 32.9 33.6  RDW 12.9 13.3 13.5  PLT 167 160 142*   BNP Recent Labs  Lab 10/19/20 1244  PROBNP 2,974*    DDimer No results for input(s): DDIMER in the last 168 hours.   Radiology/Studies:  DG Chest 2 View  Result Date: 10/19/2020 CLINICAL DATA:  Dyspnea on exertion. EXAM: CHEST - 2 VIEW COMPARISON:  Single-view of the chest 12/12/2019. CT chest 03/14/2020. FINDINGS: A small nodular opacity in the right mid lung on  the PA view only correlates with a nodule seen on the prior CT and is unchanged. Additional small nodules seen on CT are not visible on this exam. No consolidative process, pneumothorax or effusion. There is cardiomegaly. Aortic atherosclerosis. No acute or focal bony abnormality. IMPRESSION: No acute disease. Cardiomegaly. Aortic Atherosclerosis (ICD10-I70.0). Electronically Signed   By: Inge Rise M.D.   On: 10/19/2020 12:46     Assessment and Plan:   Pre-op eval for EGD with complaints of DOE and elevated troponin.  Pt with LVH/HCM, with asymmetric septal hypertrophy.  Not high risk for SCD.  Recently with increased DOE and feeling her Iron was low.  Now anemic.  Her tropoinin is elevated to  175 when prior troponin with bleed and DOE pk of 141. (Elevated troponin most likely demand ischemia)  No ischemic work planned HCM with genteic testing. "she does not have the traditional risk factors for HCM or family history of sudden death or HCM, she does present with increased severity (IVS:2.5 cm) and outflow tract obstruction."  she does not have a pathogenic variant for HCM Acute blood loss anemia per IM and GI Mild AS on last echo. Has 2-3/6 murmur   Risk Assessment/Risk Scores:     HEAR Score (for undifferentiated chest pain):  HEAR Score: 6      For questions or updates, please contact [place] Please consult [email or web address] for contact info under    Signed, Cecilie Kicks, NP  10/20/2020 9:58 AM  Attending note Patient seen and discussed with PA Dorene Ar, I agree with her documentation. History of HOCM, HTN, anemia with prior transfusions, admitted with SOB and fatigue, dark tarry stools. Hgb found to be decreased at 9.1 and was FOBT +. Cardiology consulted given elevated troponin  Elev trop last year 10/2018 in similar clinical scenarior presenting with symptomatic anemia, peak trop to 141  Hgb 9.7 -->9.1-->8.2 WBC 7.1 Plt 167 Cr 0.59 BUN 16 BNP 2974 Trop 159-->175-->153-->135 COVID neg CXR no acute process, cardiogmegaly EKG SR, LVH with chronic ST/T changes/strain pattern  02/2019 echo: LVEF >75%, severe LVH, grade I DD, mild dyanmic gradient 20 mmHg.     Elevated troponin in setting of progressive anemia, with chronic LVH and dynamic gradient that would contribute to myocardial oxyten supply demand mismatch in setting of anemia. She had a simialr troponin elevation in 10/2018 in setting of anemia. No plan for ischemic testing at this time given the likely etiology is not ACS. In general terms given recurrent symptomatic anemia and transfusions poor cath candidate. Can f/u repeat echo. Follow symptoms with management of her anemia. In general if recurrent symptoms in  absence of anemia and Hgb stable over time could reconsider outpatient stress testing.   Regarding her HOCM continue to optimize volume status, continue beta blocker, f/u echo.   BNP elevated but appears euvolemic by exam, sats 97% on RA. Active blood loss, no plans for diuresis   Recommend proceeding with GI procedures, nothing cardiac wise at this time to lead to any delay   Carlyle Dolly MD

## 2020-10-21 DIAGNOSIS — K921 Melena: Secondary | ICD-10-CM

## 2020-10-21 DIAGNOSIS — D649 Anemia, unspecified: Secondary | ICD-10-CM | POA: Diagnosis not present

## 2020-10-21 LAB — CBC
HCT: 22.7 % — ABNORMAL LOW (ref 36.0–46.0)
Hemoglobin: 7.5 g/dL — ABNORMAL LOW (ref 12.0–15.0)
MCH: 31.4 pg (ref 26.0–34.0)
MCHC: 33 g/dL (ref 30.0–36.0)
MCV: 95 fL (ref 80.0–100.0)
Platelets: 160 10*3/uL (ref 150–400)
RBC: 2.39 MIL/uL — ABNORMAL LOW (ref 3.87–5.11)
RDW: 13.7 % (ref 11.5–15.5)
WBC: 5.4 10*3/uL (ref 4.0–10.5)
nRBC: 0 % (ref 0.0–0.2)

## 2020-10-21 LAB — BASIC METABOLIC PANEL
Anion gap: 6 (ref 5–15)
BUN: 13 mg/dL (ref 8–23)
CO2: 25 mmol/L (ref 22–32)
Calcium: 8.5 mg/dL — ABNORMAL LOW (ref 8.9–10.3)
Chloride: 107 mmol/L (ref 98–111)
Creatinine, Ser: 0.59 mg/dL (ref 0.44–1.00)
GFR, Estimated: >60 mL/min (ref >60)
Glucose, Bld: 100 mg/dL — ABNORMAL HIGH (ref 70–99)
Potassium: 3.6 mmol/L (ref 3.5–5.1)
Sodium: 138 mmol/L (ref 135–145)

## 2020-10-21 NOTE — Progress Notes (Signed)
Subjective: Patient's hemoglobin down to 7.5 today.  Does note ongoing melena.  No abdominal pain or discomfort.  Tolerating full liquid diet without any issues.  Objective: Vital signs in last 24 hours: Temp:  [97.8 F (36.6 C)-98.7 F (37.1 C)] 97.8 F (36.6 C) (07/30 0159) Pulse Rate:  [77-88] 82 (07/30 0159) Resp:  [16-20] 16 (07/30 0159) BP: (53-124)/(34-85) 124/72 (07/30 0159) SpO2:  [95 %-98 %] 95 % (07/30 0159) Last BM Date: 10/19/20 General:   Alert and oriented, pleasant Head:  Normocephalic and atraumatic. Eyes:  No icterus, sclera clear. Conjuctiva pink.  Mouth:  Without lesions, mucosa pink and moist.  Neck:  Supple, without thyromegaly or masses.  Abdomen:  Bowel sounds present, soft, non-tender, non-distended. No HSM or hernias noted. No rebound or guarding. No masses appreciated  Msk:  Symmetrical without gross deformities. Normal posture. Pulses:  Normal pulses noted. Extremities:  Without clubbing or edema. Neurologic:  Alert and  oriented x4;  grossly normal neurologically. Skin:  Warm and dry, intact without significant lesions.  Cervical Nodes:  No significant cervical adenopathy. Psych:  Alert and cooperative. Normal mood and affect.  Intake/Output from previous day: 07/29 0701 - 07/30 0700 In: 150 [I.V.:150] Out: 0  Intake/Output this shift: Total I/O In: 480 [P.O.:480] Out: -   Lab Results: Recent Labs    10/19/20 1919 10/20/20 0439 10/21/20 0421  WBC 6.6 5.9 5.4  HGB 9.1* 8.2* 7.5*  HCT 27.7* 24.4* 22.7*  PLT 160 142* 160   BMET Recent Labs    10/19/20 1919 10/20/20 0439 10/21/20 0421  NA 135 138 138  K 3.7 3.7 3.6  CL 104 108 107  CO2 '26 25 25  '$ GLUCOSE 106* 108* 100*  BUN '19 20 13  '$ CREATININE 0.54 0.61 0.59  CALCIUM 8.4* 8.2* 8.5*   LFT Recent Labs    10/19/20 1244 10/19/20 1919 10/20/20 0439  PROT 5.5* 5.7* 5.0*  ALBUMIN 3.9 3.6 3.1*  AST 34 35 32  ALT 30 33 30  ALKPHOS 33* 28* 21*  BILITOT 0.2 0.4 0.8    PT/INR Recent Labs    10/19/20 1919 10/20/20 0439  LABPROT 13.4 14.2  INR 1.0 1.1   Hepatitis Panel No results for input(s): HEPBSAG, HCVAB, HEPAIGM, HEPBIGM in the last 72 hours.   Studies/Results: DG Chest 2 View  Result Date: 10/19/2020 CLINICAL DATA:  Dyspnea on exertion. EXAM: CHEST - 2 VIEW COMPARISON:  Single-view of the chest 12/12/2019. CT chest 03/14/2020. FINDINGS: A small nodular opacity in the right mid lung on the PA view only correlates with a nodule seen on the prior CT and is unchanged. Additional small nodules seen on CT are not visible on this exam. No consolidative process, pneumothorax or effusion. There is cardiomegaly. Aortic atherosclerosis. No acute or focal bony abnormality. IMPRESSION: No acute disease. Cardiomegaly. Aortic Atherosclerosis (ICD10-I70.0). Electronically Signed   By: Inge Rise M.D.   On: 10/19/2020 12:46    Assessment: *Acute on chronic anemia *Melena *History of small bowel AVMs  Plan: EGD 10/20/2020 relatively unremarkable from a bleeding standpoint.  Hemoglobin dropped today.  We will plan on capsule endoscopy first thing in the a.m. to further evaluate for her upper GI bleeding.  Do not think she would benefit from repeat colonoscopy at this time though will consider in the future.  Continue on twice daily Protonix.  Continue to monitor H&H and transfuse for less than 7.  Full liquids okay today.  N.p.o. after midnight.  Further recommendations to follow  Elon Alas. Abbey Chatters, D.O. Gastroenterology and Hepatology [facility]   LOS: 1 day    10/21/2020, 11:33 AM

## 2020-10-21 NOTE — Progress Notes (Signed)
Patient Demographics:    Stacey Reyes, is a 75 y.o. female, DOB - Sep 28, 1945, [id number]  Admit date - 10/19/2020   Admitting Physician Ayda Tancredi Denton Brick, MD  Outpatient Primary MD for the patient is [name], DO  LOS - 1   Chief Complaint  Patient presents with   Shortness of Breath        Subjective:    Stacey Reyes today has no fevers, no emesis,  No chest pain,  -  - had black tarry stool , no dizziness, no shob  -DOE is better   Assessment  & Plan :    Principal Problem:   Symptomatic anemia Active Problems:   Hypertension, essential   Hyperlipidemia   Elevated troponin   GI bleed   Melena  Brief Summary:-  75 y.o. year old female with history of hypertrophic cardiomyopathy/ HOCM/severe LVH and evidence of SAM,, HTN, HLD, B12 deficiency and IDA likely secondary to [facility] with extensive GI evaluation in 2020 and 2021 with capsule endoscopy revealing small bowel AVMs, patient requires transfusion of PRBC iron infusions from time to time, readmitted on 10/19/2020 with black tarry stools  A/p 1)Acute on chronic symptomatic Anemia---due to Gi Bleed (ABLA) -suspect secondary to previously known AVMs -GI consult appreciated -EGD on 10/20/2020 with not very revealing Hemoglobin is down to 7.5 discussed with GI team plans for capsule endoscopy on 10/23/2019 -We will transfuse if more symptomatic or if hemoglobin less than 7  2)Elevated Troponin--- in the setting of GI bleed  -Chest pain-free -Doubt ACS -Cardiology consult appreciated, neurology consult appreciated continue bisoprolol  3)HTN/HLD--- continue bisoprolol and Crestor  4)hypertrophic cardiomyopathy/ HOCM/severe LVH and evidence of SAM--- cardiology consult appreciated  --continue bisoprolol  Disposition/Need for in-Hospital Stay- patient unable to be discharged at this time due to -acute GI bleed and elevated  troponins requiring further monitoring, and GI and cardiology input--- awaiting capsule endoscopy on 10/22/2020  Status is: Inpatient  Remains inpatient appropriate because: Please see disposition above  Disposition: The patient is from: Home              Anticipated d/c is to: Home              Anticipated d/c date is: 1 day              Patient currently is not medically stable to d/c. Barriers: Not Clinically Stable-   Code Status :  -  Code Status: DNR   Family Communication:    NA (patient is alert, awake and coherent)   Consults  :  Gi/Card  DVT Prophylaxis  :   - SCDs  SCDs Start: 10/20/20 0353    Lab Results  Component Value Date   PLT 160 10/21/2020    Inpatient Medications  Scheduled Meds:  bisoprolol  2.5 mg Oral Daily   pantoprazole (PROTONIX) IV  40 mg Intravenous Q12H   rosuvastatin  10 mg Oral Daily   Continuous Infusions: PRN Meds:.    Anti-infectives (From admission, onward)    None         Objective:   Vitals:   10/20/20 1300 10/20/20 1410 10/20/20 2051 10/21/20 0159  BP: (!) 106/46 116/74 105/80 124/72  Pulse: 83 86 85 82  Resp:  $'18  18 16  'g$ Temp:  98.7 F (37.1 C) 98.7 F (37.1 C) 97.8 F (36.6 C)  TempSrc:  Oral Oral Oral  SpO2: 95% 95% 96% 95%  Weight:      Height:        Wt Readings from Last 3 Encounters:  10/19/20 90 kg  10/19/20 90.7 kg  08/02/20 89.8 kg     Intake/Output Summary (Last 24 hours) at 10/21/2020 1410 Last data filed at 10/21/2020 0900 Gross per 24 hour  Intake 480 ml  Output --  Net 480 ml     Physical Exam  Gen:- Awake Alert,  in no apparent distress  HEENT:- [place].AT, No sclera icterus Neck-Supple Neck,No JVD,.  Lungs-  CTAB , fair symmetrical air movement CV- S1, S2 normal, regular 3/6 SM Abd-  +ve B.Sounds, Abd Soft, No tenderness,    Extremity/Skin:- No  edema, pedal pulses present  Psych-affect is appropriate, oriented x3 Neuro-no new focal deficits, no tremors   Data Review:   Micro  Results Recent Results (from the past 240 hour(s))  Resp Panel by RT-PCR (Flu A&B, Covid) Nasopharyngeal Swab     Status: None   Collection Time: 10/19/20 10:02 PM   Specimen: Nasopharyngeal Swab; Nasopharyngeal(NP) swabs in vial transport medium  Result Value Ref Range Status   SARS Coronavirus 2 by RT PCR NEGATIVE NEGATIVE Final    Comment: (NOTE) SARS-CoV-2 target nucleic acids are NOT DETECTED.  The SARS-CoV-2 RNA is generally detectable in upper respiratory specimens during the acute phase of infection. The lowest concentration of SARS-CoV-2 viral copies this assay can detect is 138 copies/mL. A negative result does not preclude SARS-Cov-2 infection and should not be used as the sole basis for treatment or other patient management decisions. A negative result may occur with  improper specimen collection/handling, submission of specimen other than nasopharyngeal swab, presence of viral mutation(s) within the areas targeted by this assay, and inadequate number of viral copies(<138 copies/mL). A negative result must be combined with clinical observations, patient history, and epidemiological information. The expected result is Negative.  Fact Sheet for Patients:  EntrepreneurPulse.com.au  Fact Sheet for Healthcare Providers:  IncredibleEmployment.be  This test is no t yet approved or cleared by the [place] FDA and  has been authorized for detection and/or diagnosis of SARS-CoV-2 by FDA under an Emergency Use Authorization (EUA). This EUA will remain  in effect (meaning this test can be used) for the duration of the COVID-19 declaration under Section 564(b)(1) of the Act, 21 U.S.C.section 360bbb-3(b)(1), unless the authorization is terminated  or revoked sooner.       Influenza A by PCR NEGATIVE NEGATIVE Final   Influenza B by PCR NEGATIVE NEGATIVE Final    Comment: (NOTE) The Xpert Xpress SARS-CoV-2/FLU/RSV plus assay is intended as  an aid in the diagnosis of influenza from Nasopharyngeal swab specimens and should not be used as a sole basis for treatment. Nasal washings and aspirates are unacceptable for Xpert Xpress SARS-CoV-2/FLU/RSV testing.  Fact Sheet for Patients: EntrepreneurPulse.com.au  Fact Sheet for Healthcare Providers: IncredibleEmployment.be  This test is not yet approved or cleared by the [place] FDA and has been authorized for detection and/or diagnosis of SARS-CoV-2 by FDA under an Emergency Use Authorization (EUA). This EUA will remain in effect (meaning this test can be used) for the duration of the COVID-19 declaration under Section 564(b)(1) of the Act, 21 U.S.C. section 360bbb-3(b)(1), unless the authorization is terminated or revoked.  Performed at [facility], [phone]  [address], [place], [place] [address]     Radiology Reports DG Chest 2 View  Result Date: 10/19/2020 CLINICAL DATA:  Dyspnea on exertion. EXAM: CHEST - 2 VIEW COMPARISON:  Single-view of the chest 12/12/2019. CT chest 03/14/2020. FINDINGS: A small nodular opacity in the right mid lung on the PA view only correlates with a nodule seen on the prior CT and is unchanged. Additional small nodules seen on CT are not visible on this exam. No consolidative process, pneumothorax or effusion. There is cardiomegaly. Aortic atherosclerosis. No acute or focal bony abnormality. IMPRESSION: No acute disease. Cardiomegaly. Aortic Atherosclerosis (ICD10-I70.0). Electronically Signed   By: Inge Rise M.D.   On: 10/19/2020 12:46     CBC Recent Labs  Lab 10/19/20 1244 10/19/20 1919 10/20/20 0439 10/21/20 0421  WBC 7.1 6.6 5.9 5.4  HGB 9.7* 9.1* 8.2* 7.5*  HCT 28.6* 27.7* 24.4* 22.7*  PLT 167 160 142* 160  MCV 92 95.8 94.6 95.0  MCH 31.3 31.5 31.8 31.4  MCHC 33.9 32.9 33.6 33.0  RDW 12.9 13.3 13.5 13.7  LYMPHSABS 2.0 2.2  --   --   MONOABS  --  0.7  --   --   EOSABS 0.3 0.3  --   --    BASOSABS 0.1 0.1  --   --     Chemistries  Recent Labs  Lab 10/19/20 1244 10/19/20 1919 10/20/20 0439 10/21/20 0421  NA 139 135 138 138  K 4.2 3.7 3.7 3.6  CL 103 104 108 107  CO2 '24 26 25 25  '$ GLUCOSE 129* 106* 108* 100*  BUN '16 19 20 13  '$ CREATININE 0.59 0.54 0.61 0.59  CALCIUM 8.8 8.4* 8.2* 8.5*  MG  --   --  2.0  --   AST 34 35 32  --   ALT 30 33 30  --   ALKPHOS 33* 28* 21*  --   BILITOT 0.2 0.4 0.8  --    ------------------------------------------------------------------------------------------------------------------ No results for input(s): CHOL, HDL, LDLCALC, TRIG, CHOLHDL, LDLDIRECT in the last 72 hours.  Lab Results  Component Value Date   HGBA1C 5.6 02/24/2015   ------------------------------------------------------------------------------------------------------------------ No results for input(s): TSH, T4TOTAL, T3FREE, THYROIDAB in the last 72 hours.  Invalid input(s): FREET3 ------------------------------------------------------------------------------------------------------------------ Recent Labs    10/19/20 1244  VITAMINB12 920  FOLATE 12.9  FERRITIN 304*  TIBC 265  IRON 82  RETICCTPCT 2.7*    Coagulation profile Recent Labs  Lab 10/19/20 1919 10/20/20 0439  INR 1.0 1.1    No results for input(s): DDIMER in the last 72 hours.  Cardiac Enzymes No results for input(s): CKMB, TROPONINI, MYOGLOBIN in the last 168 hours.  Invalid input(s): CK ------------------------------------------------------------------------------------------------------------------    Component Value Date/Time   BNP 239.0 (H) 11/22/2019 1112     Roxan Hockey M.D on 10/21/2020 at 2:10 PM  Go to [email or web address] - for contact info  [facility] - Office  [phone]

## 2020-10-21 NOTE — Progress Notes (Signed)
One bm this morning. Knows to have nothing after midnight in preparation for Givens capsule in am, Showered today.  Sister at bedside

## 2020-10-22 ENCOUNTER — Encounter (HOSPITAL_COMMUNITY)
Admission: EM | Disposition: A | Discharge status: Home / Self Care | Payer: Self-pay | Source: Home / Self Care | Attending: Family Medicine

## 2020-10-22 DIAGNOSIS — D649 Anemia, unspecified: Secondary | ICD-10-CM | POA: Diagnosis not present

## 2020-10-22 HISTORY — PX: GIVENS CAPSULE STUDY: SHX5432

## 2020-10-22 LAB — CBC
HCT: 22.7 % — ABNORMAL LOW (ref 36.0–46.0)
Hemoglobin: 7.4 g/dL — ABNORMAL LOW (ref 12.0–15.0)
MCH: 31.2 pg (ref 26.0–34.0)
MCHC: 32.6 g/dL (ref 30.0–36.0)
MCV: 95.8 fL (ref 80.0–100.0)
Platelets: 180 10*3/uL (ref 150–400)
RBC: 2.37 MIL/uL — ABNORMAL LOW (ref 3.87–5.11)
RDW: 13.9 % (ref 11.5–15.5)
WBC: 5.6 10*3/uL (ref 4.0–10.5)
nRBC: 0 % (ref 0.0–0.2)

## 2020-10-22 SURGERY — IMAGING PROCEDURE, GI TRACT, INTRALUMINAL, VIA CAPSULE

## 2020-10-22 MED ORDER — SODIUM CHLORIDE 0.9 % IV SOLN
125.0000 mg | Freq: Once | INTRAVENOUS | Status: AC
  Administered 2020-10-22: 125 mg via INTRAVENOUS
  Filled 2020-10-22: qty 10

## 2020-10-22 MED ORDER — SODIUM CHLORIDE 0.9 % IV SOLN: 250.0000 mg | Freq: Once | INTRAVENOUS | Status: DC

## 2020-10-22 MED ORDER — PANTOPRAZOLE SODIUM 40 MG PO TBEC
40.0000 mg | DELAYED_RELEASE_TABLET | Freq: Two times a day (BID) | ORAL | 5 refills | Status: DC
Start: 2020-10-22 — End: 2020-11-07

## 2020-10-22 NOTE — Progress Notes (Signed)
Discharge teaching complete. Meds, diet, activity, follow up appointments reviewed and all questions answered. Copy of instructions given to patient and prescription sent to pharmacy. Patient discharged home via wheelchair with sister.

## 2020-10-22 NOTE — Discharge Summary (Signed)
Stacey Reyes, is a 75 y.o. female  DOB 15-Dec-1945  MRN [id number].  Admission date:  10/19/2020  Admitting Physician  Roxan Hockey, MD  Discharge Date:  10/22/2020   Primary MD  [name], DO  Recommendations for primary care physician for things to follow:   1)Avoid ibuprofen/Advil/Aleve/Motrin/Goody Powders/Naproxen/BC powders/Meloxicam/Diclofenac/Indomethacin and other Nonsteroidal anti-inflammatory medications as these will make you more likely to bleed and can cause stomach ulcers, can also cause Kidney problems.   2) please take Protonix 40 mg twice a day to help prevent further bleeding from your gastrointestinal tract  3)Follow-up Gastroenterologist Dr. Hurshel Keys with [facility]--- in 2 to 3 days to get the results of your capsule endoscopy study  -address: [address], [place], [place] [address], Phone: [phone]\  4) follow-up with your hematologist for ongoing monitoring of the anemia as well as iron or blood transfusion as needed  5) you should get a repeat CBC blood test within the next 3 to 4 days  6) please call if any dark stools or blood in your stool   Admission Diagnosis  Melena [K92.1] Precordial chest pain [R07.2] SOB (shortness of breath) [R06.02] Symptomatic anemia [D64.9]   Discharge Diagnosis  Melena [K92.1] Precordial chest pain [R07.2] SOB (shortness of breath) [R06.02] Symptomatic anemia [D64.9]    Principal Problem:   Symptomatic anemia Active Problems:   Hypertension, essential   Hyperlipidemia   Elevated troponin   GI bleed   Melena      Past Medical History:  Diagnosis Date   Anemia    Cataract    removed bilateral   Chronic cystitis    Diverticulitis    Heart murmur    Hyperlipidemia    Hypertension    Personal history of colonic polyps-adenomas 01/07/2012   2009 - 2 diminutive adenomas (prior  polyps also) 01/07/2012 - 2 diminutive adenomas      Past Surgical History:  Procedure Laterality Date   BREAST BIOPSY Right    No Scar seen    COLONOSCOPY  multiple   ESOPHAGOGASTRODUODENOSCOPY (EGD) WITH PROPOFOL N/A 11/23/2019   Procedure: ESOPHAGOGASTRODUODENOSCOPY (EGD) WITH PROPOFOL;  Surgeon: Daneil Dolin, MD;  Location: [facility] ENDO [facility];  Service: Endoscopy;  Laterality: N/A;   GIVENS CAPSULE STUDY N/A 11/23/2019   Procedure: GIVENS CAPSULE STUDY;  Surgeon: Daneil Dolin, MD;  Location: [facility] ENDO SUITE;  Service: Endoscopy;  Laterality: N/A;     HPI  from the history and physical done on the day of admission:    Chief Complaint: Shortness of breath   HPI: Stacey Reyes is a 75 y.o. female with medical history significant for hypertension, hyperlipidemia, GERD, symptomatic anemia requiring iron transfusions PRBC who presents to the emergency department due to 1 week onset of exertional weakness and fatigue.  She complained of noticing dark tarry stools since last week.  She went to see her PCP yesterday due to persistent symptoms mentioned above and lab work was done.  Patient continues to have shortness of breath with  occasional chest tightness.  Patient complained of soft BP this morning with SBP in the 70s to 80s and reported dizziness when she stands, so she had a repeat lab done at her PCPs office and showed 2 gram drop in hemoglobin, she was then referred to go to the ED for further evaluation.   ED Course:  In the emergency department, she was hemodynamically stable.  Work-up in the ED showed normocytic anemia and normal BMP.  Troponin x2 -159 > 175.  FOBT was positive, NT-proBNP 2974.  Influenza A, B, SARS coronavirus 2 was positive. Chest x-ray showed no acute disease. Patient was treated with Protonix, IV hydration of 500 mL NS was provided. Gastroenterology (Dr. Loletha Carrow with [facility]) was consulted and recommended outpatient can be admitted here at [facility].  Cardiology was consulted,  labs and EKG were reviewed and recommended that patient could be admitted here at [facility], troponin can be trended and recommended transferring patient to [facility] only if troponin is dramatically increasing or patient develops chest pain at rest but ED medical record.  Hospitalist was asked to admit patient for further evaluation and management.   Hospital Course:     Brief Summary:-  75 y.o. year old female with history of hypertrophic cardiomyopathy/ HOCM/severe LVH and evidence of SAM,, HTN, HLD, B12 deficiency and IDA likely secondary to [facility] with extensive GI evaluation in 2020 and 2021 with capsule endoscopy revealing small bowel AVMs, patient requires transfusion of PRBC iron infusions from time to time, readmitted on 10/19/2020 with black tarry stools   A/p 1)Acute on chronic symptomatic Anemia---due to Gi Bleed (ABLA) -suspect secondary to previously known AVMs -GI consult appreciated -EGD on 10/20/2020 with not very revealing -Hgb yesterday was 7.5 and repeat today is 7.4 -Hemoglobin initially dropped from 9.7 after hydration but over the last couple of days as noted above hemoglobin has been stable above 7 -She had capsule endoscopy on 10/23/2019 -Patient also received iron transfusion on 10/22/2020 -Patient will follow-up as outpatient with gastroenterology for results of capsule endoscopy -Patient was to follow-up with hematologist for ongoing CBC check and iron infusions or PRBC transfusions as needed from time to time -no further BM or concerns for ongoing GI bleed at this time  2)Elevated Troponin--- in the setting of GI bleed  -Chest pain-free -Doubt ACS -Cardiology consult appreciated, neurology consult appreciated continue bisoprolol   3)HTN/HLD--- continue bisoprolol and Crestor   4)hypertrophic cardiomyopathy/ HOCM/severe LVH and evidence of SAM--- cardiology consult appreciated  --continue bisoprolol   Disposition--discharge home in hemodynamically stable condition, no further  BM or concerns for ongoing GI bleed at this time  Disposition: The patient is from: Home              Anticipated d/c is to: Home                Code Status :  -  Code Status: DNR    Family Communication:    (patient is alert, awake and coherent)   Consults  :  Gi/Cardiology  Discharge Condition: stable  Follow UP--GI and hematology as advised   Diet and Activity recommendation:  As advised  Discharge Instructions    Discharge Instructions     Call MD for:  difficulty breathing, headache or visual disturbances   Complete by: As directed    Call MD for:  persistant dizziness or light-headedness   Complete by: As directed    Call MD for:  persistant nausea and vomiting   Complete by: As directed  Call MD for:  severe uncontrolled pain   Complete by: As directed    Call MD for:  temperature >100.4   Complete by: As directed    Diet - low sodium heart healthy   Complete by: As directed    Discharge instructions   Complete by: As directed    1)Avoid ibuprofen/Advil/Aleve/Motrin/Goody Powders/Naproxen/BC powders/Meloxicam/Diclofenac/Indomethacin and other Nonsteroidal anti-inflammatory medications as these will make you more likely to bleed and can cause stomach ulcers, can also cause Kidney problems.   2) please take Protonix 40 mg twice a day to help prevent further bleeding from your gastrointestinal tract  3)Follow-up Gastroenterologist Dr. Hurshel Keys with [facility]--- in 2 to 3 days to get the results of your capsule endoscopy study  -address: [address] [place], [place] [address], Phone: [phone]\  4) follow-up with your hematologist for ongoing monitoring of the anemia as well as iron or blood transfusion as needed  5) you should get a repeat CBC blood test within the next 3 to 4 days  6) please call if any dark stools or blood in your stool   Increase activity slowly   Complete by: As directed         Discharge Medications      Allergies as of 10/22/2020       Reactions   Sulfa Antibiotics Rash        Medication List     STOP taking these medications    nitrofurantoin 100 MG capsule Commonly known as: MACRODANTIN       TAKE these medications    bisoprolol 5 MG tablet Commonly known as: ZEBETA Take 2.5 mg by mouth daily.   CALCIUM-MAGNESIUM-ZINC-D3 PO Take 1 tablet by mouth daily.   COPPER CAPS PO Take 1 capsule by mouth daily.   cyanocobalamin 2000 MCG tablet Commonly known as: [employer] VITAMIN B12 Take 1 tablet (2,000 mcg total) by mouth daily.   ergocalciferol 1.25 MG (50000 UT) capsule Commonly known as: VITAMIN D2 Take 1 capsule (50,000 Units total) by mouth once a week.   Magnesium 200 MG Tabs Take 1 tablet (200 mg total) by mouth daily.   multivitamin capsule Take 1 capsule by mouth daily.   pantoprazole 40 MG tablet Commonly known as: PROTONIX Take 1 tablet (40 mg total) by mouth 2 (two) times daily before a meal. (NEEDS TO BE SEEN BEFORE NEXT REFILL) What changed: when to take this   rosuvastatin 10 MG tablet Commonly known as: CRESTOR TAKE 1 TABLET BY MOUTH AT  BEDTIME   vitamin C 1000 MG tablet Take 1,000 mg by mouth daily.   vitamin E 180 MG (400 UNITS) capsule Take 400 Units by mouth daily.       Major procedures and Radiology Reports - PLEASE review detailed and final reports for all details, in brief -   DG Chest 2 View  Result Date: 10/19/2020 CLINICAL DATA:  Dyspnea on exertion. EXAM: CHEST - 2 VIEW COMPARISON:  Single-view of the chest 12/12/2019. CT chest 03/14/2020. FINDINGS: A small nodular opacity in the right mid lung on the PA view only correlates with a nodule seen on the prior CT and is unchanged. Additional small nodules seen on CT are not visible on this exam. No consolidative process, pneumothorax or effusion. There is cardiomegaly. Aortic atherosclerosis. No acute or focal bony abnormality. IMPRESSION: No acute disease. Cardiomegaly. Aortic  Atherosclerosis (ICD10-I70.0). Electronically Signed   By: Inge Rise M.D.   On: 10/19/2020 12:46    Micro  Results   Recent Results (from the past 240 hour(s))  Resp Panel by RT-PCR (Flu A&B, Covid) Nasopharyngeal Swab     Status: None   Collection Time: 10/19/20 10:02 PM   Specimen: Nasopharyngeal Swab; Nasopharyngeal(NP) swabs in vial transport medium  Result Value Ref Range Status   SARS Coronavirus 2 by RT PCR NEGATIVE NEGATIVE Final    Comment: (NOTE) SARS-CoV-2 target nucleic acids are NOT DETECTED.  The SARS-CoV-2 RNA is generally detectable in upper respiratory specimens during the acute phase of infection. The lowest concentration of SARS-CoV-2 viral copies this assay can detect is 138 copies/mL. A negative result does not preclude SARS-Cov-2 infection and should not be used as the sole basis for treatment or other patient management decisions. A negative result may occur with  improper specimen collection/handling, submission of specimen other than nasopharyngeal swab, presence of viral mutation(s) within the areas targeted by this assay, and inadequate number of viral copies(<138 copies/mL). A negative result must be combined with clinical observations, patient history, and epidemiological information. The expected result is Negative.  Fact Sheet for Patients:  EntrepreneurPulse.com.au  Fact Sheet for Healthcare Providers:  IncredibleEmployment.be  This test is no t yet approved or cleared by the [place] FDA and  has been authorized for detection and/or diagnosis of SARS-CoV-2 by FDA under an Emergency Use Authorization (EUA). This EUA will remain  in effect (meaning this test can be used) for the duration of the COVID-19 declaration under Section 564(b)(1) of the Act, 21 U.S.C.section 360bbb-3(b)(1), unless the authorization is terminated  or revoked sooner.       Influenza A by PCR NEGATIVE NEGATIVE Final    Influenza B by PCR NEGATIVE NEGATIVE Final    Comment: (NOTE) The Xpert Xpress SARS-CoV-2/FLU/RSV plus assay is intended as an aid in the diagnosis of influenza from Nasopharyngeal swab specimens and should not be used as a sole basis for treatment. Nasal washings and aspirates are unacceptable for Xpert Xpress SARS-CoV-2/FLU/RSV testing.  Fact Sheet for Patients: EntrepreneurPulse.com.au  Fact Sheet for Healthcare Providers: IncredibleEmployment.be  This test is not yet approved or cleared by the [place] FDA and has been authorized for detection and/or diagnosis of SARS-CoV-2 by FDA under an Emergency Use Authorization (EUA). This EUA will remain in effect (meaning this test can be used) for the duration of the COVID-19 declaration under Section 564(b)(1) of the Act, 21 U.S.C. section 360bbb-3(b)(1), unless the authorization is terminated or revoked.  Performed at [facility], [address] [place], [place] [address]     Today   Subjective    Stacey Reyes today has no new complaints  no further BM or concerns for ongoing GI bleed at this time  -No shortness of breath, no dyspnea on exertion, no dizziness, no abdominal pain          Patient has been seen and examined prior to discharge   Objective   Blood pressure 117/64, pulse 77, temperature 98.1 F (36.7 C), temperature source Oral, resp. rate 18, height '5\' 1"'$  (1.549 m), weight 90 kg, SpO2 98 %.   Intake/Output Summary (Last 24 hours) at 10/22/2020 1409 Last data filed at 10/22/2020 0720 Gross per 24 hour  Intake 600 ml  Output --  Net 600 ml    Exam Gen:- Awake Alert,  in no apparent distress HEENT:- [place].AT, No sclera icterus Neck-Supple Neck,No JVD,. Lungs-  CTAB , fair symmetrical air movement CV- S1, S2 normal, regular 3/6 SM Abd-  +ve B.Sounds, Abd Soft, No  tenderness,    Extremity/Skin:- No  edema, pedal pulses present Psych-affect is appropriate, oriented  x3 Neuro-no new focal deficits, no tremors   Data Review   CBC w Diff:  Lab Results  Component Value Date   WBC 5.6 10/22/2020   HGB 7.4 (L) 10/22/2020   HGB 9.7 (L) 10/19/2020   HCT 22.7 (L) 10/22/2020   HCT 28.6 (L) 10/19/2020   PLT 180 10/22/2020   PLT 167 10/19/2020   LYMPHOPCT 33 10/19/2020   MONOPCT 11 10/19/2020   EOSPCT 4 10/19/2020   BASOPCT 1 10/19/2020    CMP:  Lab Results  Component Value Date   NA 138 10/21/2020   NA 139 10/19/2020   K 3.6 10/21/2020   CL 107 10/21/2020   CO2 25 10/21/2020   BUN 13 10/21/2020   BUN 16 10/19/2020   CREATININE 0.59 10/21/2020   PROT 5.0 (L) 10/20/2020   PROT 5.5 (L) 10/19/2020   ALBUMIN 3.1 (L) 10/20/2020   ALBUMIN 3.9 10/19/2020   BILITOT 0.8 10/20/2020   BILITOT 0.2 10/19/2020   ALKPHOS 21 (L) 10/20/2020   AST 32 10/20/2020   ALT 30 10/20/2020  .   Total Discharge time is about 33 minutes  Roxan Hockey M.D on 10/22/2020 at 2:09 PM  Go to [email or web address] -  for contact info  [facility] - Office  [phone]

## 2020-10-22 NOTE — Progress Notes (Signed)
Subjective: Patient doing well this morning.  No melena hematochezia.  Hemoglobin stable at 7.6.  Currently undergoing capsule endoscopy.  No abdominal pain or discomfort.  Objective: Vital signs in last 24 hours: Temp:  [97.6 F (36.4 C)-98.3 F (36.8 C)] 98.3 F (36.8 C) (07/31 0532) Pulse Rate:  [72-80] 79 (07/31 0532) Resp:  [16-18] 16 (07/31 0532) BP: (106-125)/(58-71) 106/66 (07/31 0532) SpO2:  [96 %-98 %] 96 % (07/31 0532) Last BM Date: 10/21/20 General:   Alert and oriented, pleasant Head:  Normocephalic and atraumatic. Eyes:  No icterus, sclera clear. Conjuctiva pink.  Mouth:  Without lesions, mucosa pink and moist.  Neck:  Supple, without thyromegaly or masses.  Abdomen:  Bowel sounds present, soft, non-tender, non-distended. No HSM or hernias noted. No rebound or guarding. No masses appreciated  Msk:  Symmetrical without gross deformities. Normal posture. Extremities:  Without clubbing or edema. Neurologic:  Alert and  oriented x4;  grossly normal neurologically. Skin:  Warm and dry, intact without significant lesions.  Cervical Nodes:  No significant cervical adenopathy. Psych:  Alert and cooperative. Normal mood and affect.  Intake/Output from previous day: 07/30 0701 - 07/31 0700 In: 1560 [P.O.:1560] Out: -  Intake/Output this shift: No intake/output data recorded.  Lab Results: Recent Labs    10/20/20 0439 10/21/20 0421 10/22/20 0216  WBC 5.9 5.4 5.6  HGB 8.2* 7.5* 7.4*  HCT 24.4* 22.7* 22.7*  PLT 142* 160 180   BMET Recent Labs    10/19/20 1919 10/20/20 0439 10/21/20 0421  NA 135 138 138  K 3.7 3.7 3.6  CL 104 108 107  CO2 '26 25 25  '$ GLUCOSE 106* 108* 100*  BUN '19 20 13  '$ CREATININE 0.54 0.61 0.59  CALCIUM 8.4* 8.2* 8.5*   LFT Recent Labs    10/19/20 1244 10/19/20 1919 10/20/20 0439  PROT 5.5* 5.7* 5.0*  ALBUMIN 3.9 3.6 3.1*  AST 34 35 32  ALT 30 33 30  ALKPHOS 33* 28* 21*  BILITOT 0.2 0.4 0.8   PT/INR Recent Labs     10/19/20 1919 10/20/20 0439  LABPROT 13.4 14.2  INR 1.0 1.1   Hepatitis Panel No results for input(s): HEPBSAG, HCVAB, HEPAIGM, HEPBIGM in the last 72 hours.   Studies/Results: No results found.  Assessment: *Acute on chronic anemia *Melena *History of small bowel AVMs   Plan: EGD 10/20/2020 relatively unremarkable from a bleeding standpoint.  Capsule placed this morning.  Should be done around 1730 this evening.  Hemoglobin stable today without evidence of further bleeding.  Okay from GI standpoint to discharge home after capsule study.  Continue on twice daily PPI.  Can consider outpatient chronic octreotide therapy if capsule reveals multiple AVMs like prior.  Follow-up with hematology for iron infusion/blood transfusions as needed.  We will be in contact with patient once capsule study has been read.  GI to sign off, please call with any question concerns.   Elon Alas. Abbey Chatters, D.O. Gastroenterology and Hepatology [facility]   LOS: 2 days    10/22/2020, 12:34 PM

## 2020-10-23 ENCOUNTER — Telehealth: Payer: Self-pay | Admitting: Family Medicine

## 2020-10-23 NOTE — Telephone Encounter (Signed)
Contacted patient.  30 minute hospital follow up scheduled with Marjorie Smolder, NP on August 3rd, 2022 at [facility]

## 2020-10-24 ENCOUNTER — Encounter (HOSPITAL_COMMUNITY): Payer: Self-pay | Admitting: Internal Medicine

## 2020-10-25 ENCOUNTER — Ambulatory Visit (INDEPENDENT_AMBULATORY_CARE_PROVIDER_SITE_OTHER): Payer: Medicare Other | Admitting: Family Medicine

## 2020-10-25 ENCOUNTER — Encounter: Payer: Self-pay | Admitting: Family Medicine

## 2020-10-25 ENCOUNTER — Other Ambulatory Visit: Payer: Self-pay

## 2020-10-25 ENCOUNTER — Telehealth: Payer: Self-pay | Admitting: Internal Medicine

## 2020-10-25 VITALS — BP 115/58 | HR 78 | Temp 97.1°F | Resp 20 | Ht 61.0 in | Wt 198.0 lb

## 2020-10-25 DIAGNOSIS — Z09 Encounter for follow-up examination after completed treatment for conditions other than malignant neoplasm: Secondary | ICD-10-CM

## 2020-10-25 DIAGNOSIS — R0600 Dyspnea, unspecified: Secondary | ICD-10-CM

## 2020-10-25 DIAGNOSIS — I10 Essential (primary) hypertension: Secondary | ICD-10-CM

## 2020-10-25 DIAGNOSIS — R0609 Other forms of dyspnea: Secondary | ICD-10-CM

## 2020-10-25 DIAGNOSIS — D62 Acute posthemorrhagic anemia: Secondary | ICD-10-CM

## 2020-10-25 DIAGNOSIS — R6889 Other general symptoms and signs: Secondary | ICD-10-CM | POA: Diagnosis not present

## 2020-10-25 DIAGNOSIS — K921 Melena: Secondary | ICD-10-CM

## 2020-10-25 DIAGNOSIS — D649 Anemia, unspecified: Secondary | ICD-10-CM | POA: Diagnosis not present

## 2020-10-25 LAB — HEMOGLOBIN, FINGERSTICK: Hemoglobin: 8.1 g/dL — ABNORMAL LOW (ref 11.1–15.9)

## 2020-10-25 NOTE — Telephone Encounter (Signed)
Spoke to pt concerning results.  Informed her that it looked like everything was normal and there was mention in his note about outpatient capsule endoscopy.  Informed pt that I would touch base with Dr. Abbey Chatters and be back in touch with her.  Dr. Abbey Chatters:  Would you like [place] to arrange?

## 2020-10-25 NOTE — Progress Notes (Signed)
Established Patient Office Visit  Subjective:  Patient ID: Stacey Reyes, female    DOB: 1946-01-31  Age: 75 y.o. MRN: [id number]  CC:  Chief Complaint  Patient presents with   Hospitalization Follow-up    HPI Stacey Reyes presents for hospital follow up. She was discharged on 10/22/20 from [facility]. She was admitted for symptomatic anemia, melena, and shortness of breath. She received an iron transfusion. An EGD was done but no source of bleeding was found. A capsule study was also done, she has not yet received the results of this. She was discharged with a hemoglobin at 7.4. She did have elevated troponin levels, this was thought be due to ischemia from anemia and not ACS. Cardiology follow up was recommended for severe LVH.   She reports feeling better overall. BP at home has been running upper 90s/50s and seems to be trending up. She denies lightheadedness, dizziness, palpitations. She reports her fatigue is improving. She continues to report shortness of breath with exertion. This occurs when she walks 20 feet or more. She feels like this has been unchanged since her admission. She denies orthopnea, edema, or cough.   She has an appointment with hematology in the next few weeks. She is not schedule for a follow up with cardiology.   Past Medical History:  Diagnosis Date   Anemia    Cataract    removed bilateral   Chronic cystitis    Diverticulitis    Heart murmur    Hyperlipidemia    Hypertension    Personal history of colonic polyps-adenomas 01/07/2012   2009 - 2 diminutive adenomas (prior polyps also) 01/07/2012 - 2 diminutive adenomas      Past Surgical History:  Procedure Laterality Date   BREAST BIOPSY Right    No Scar seen    COLONOSCOPY  multiple   ESOPHAGOGASTRODUODENOSCOPY (EGD) WITH PROPOFOL N/A 11/23/2019   Procedure: ESOPHAGOGASTRODUODENOSCOPY (EGD) WITH PROPOFOL;  Surgeon: Daneil Dolin, MD;  Location: [facility];  Service: Endoscopy;  Laterality: N/A;    ESOPHAGOGASTRODUODENOSCOPY (EGD) WITH PROPOFOL N/A 10/20/2020   Procedure: ESOPHAGOGASTRODUODENOSCOPY (EGD) WITH PROPOFOL;  Surgeon: Eloise Harman, DO;  Location: [facility];  Service: Endoscopy;  Laterality: N/A;   GIVENS CAPSULE STUDY N/A 11/23/2019   Procedure: GIVENS CAPSULE STUDY;  Surgeon: Daneil Dolin, MD;  Location: [facility];  Service: Endoscopy;  Laterality: N/A;   GIVENS CAPSULE STUDY N/A 10/22/2020   Procedure: GIVENS CAPSULE STUDY;  Surgeon: Eloise Harman, DO;  Location: [facility];  Service: Endoscopy;  Laterality: N/A;    Family History  Problem Relation Age of Onset   Colon cancer Mother 2       80's   Breast cancer Sister    Asthma Brother    Colon polyps Neg Hx    Esophageal cancer Neg Hx    Rectal cancer Neg Hx    Stomach cancer Neg Hx     Social History   Socioeconomic History   Marital status: Married    Spouse name: Not on file   Number of children: 1   Years of education: Not on file   Highest education level: Not on file  Occupational History   Occupation: retired  Tobacco Use   Smoking status: Former    Types: Cigarettes    Quit date: 12/23/1984    Years since quitting: 35.8   Smokeless tobacco: Never  Vaping Use   Vaping Use: Never used  Substance and Sexual Activity  Alcohol use: Yes    Comment: rare   Drug use: No   Sexual activity: Not on file  Other Topics Concern   Not on file  Social History Narrative   Patient is married and retired and has 1 grown child   Social Determinants of Radio broadcast assistant Strain: Not on file  Food Insecurity: Not on file  Transportation Needs: Not on file  Physical Activity: Not on file  Stress: Not on file  Social Connections: Not on file  Intimate Partner Violence: Not on file    Outpatient Medications Prior to Visit  Medication Sig Dispense Refill   Ascorbic Acid (VITAMIN C) 1000 MG tablet Take 1,000 mg by mouth daily.     bisoprolol (ZEBETA) 5 MG tablet Take 2.5  mg by mouth daily.     Copper Gluconate (COPPER CAPS PO) Take 1 capsule by mouth daily.      cyanocobalamin ([employer] VITAMIN B12) 2000 MCG tablet Take 1 tablet (2,000 mcg total) by mouth daily.     ergocalciferol (VITAMIN D2) 1.25 MG (50000 UT) capsule Take 1 capsule (50,000 Units total) by mouth once a week. 16 capsule 3   Magnesium 200 MG TABS Take 1 tablet (200 mg total) by mouth daily. 30 tablet 6   Multiple Minerals-Vitamins (CALCIUM-MAGNESIUM-ZINC-D3 PO) Take 1 tablet by mouth daily.     Multiple Vitamin (MULTIVITAMIN) capsule Take 1 capsule by mouth daily.      pantoprazole (PROTONIX) 40 MG tablet Take 1 tablet (40 mg total) by mouth 2 (two) times daily before a meal. (NEEDS TO BE SEEN BEFORE NEXT REFILL) 60 tablet 5   rosuvastatin (CRESTOR) 10 MG tablet TAKE 1 TABLET BY MOUTH AT  BEDTIME 90 tablet 3   vitamin E 400 UNIT capsule Take 400 Units by mouth daily.      No facility-administered medications prior to visit.    Allergies  Allergen Reactions   Sulfa Antibiotics Rash    ROS Review of Systems As per HPI.    Objective:    Physical Exam Vitals and nursing note reviewed.  Constitutional:      General: She is not in acute distress.    Appearance: She is not ill-appearing, toxic-appearing or diaphoretic.  Cardiovascular:     Rate and Rhythm: Normal rate and regular rhythm.     Heart sounds: Murmur heard.  Systolic murmur is present with a grade of 3/6.    No friction rub. No gallop.  Pulmonary:     Effort: Pulmonary effort is normal. No respiratory distress.     Breath sounds: Normal breath sounds. No wheezing, rhonchi or rales.  Abdominal:     General: Bowel sounds are normal. There is no distension.     Palpations: Abdomen is soft.     Tenderness: There is no abdominal tenderness. There is no guarding or rebound.  Musculoskeletal:     Right lower leg: No edema.     Left lower leg: No edema.  Skin:    General: Skin is warm and dry.  Neurological:     General: No  focal deficit present.     Mental Status: She is alert and oriented to person, place, and time.  Psychiatric:        Mood and Affect: Mood normal.        Behavior: Behavior normal.    BP (!) 115/58   Pulse 78   Temp (!) 97.1 F (36.2 C) (Temporal)   Resp 20   Ht 5'  1" (1.549 m)   Wt 198 lb (89.8 kg)   SpO2 96%   BMI 37.41 kg/m  Wt Readings from Last 3 Encounters:  10/25/20 198 lb (89.8 kg)  10/19/20 198 lb 6.6 oz (90 kg)  10/19/20 200 lb (90.7 kg)     Health Maintenance Due  Topic Date Due   COVID-19 Vaccine (4 - Booster for Moderna series) 04/20/2020   INFLUENZA VACCINE  10/23/2020    There are no preventive care reminders to display for this patient.  Lab Results  Component Value Date   TSH 1.440 03/08/2020   Lab Results  Component Value Date   WBC 5.6 10/22/2020   HGB 7.4 (L) 10/22/2020   HCT 22.7 (L) 10/22/2020   MCV 95.8 10/22/2020   PLT 180 10/22/2020   Lab Results  Component Value Date   NA 138 10/21/2020   K 3.6 10/21/2020   CO2 25 10/21/2020   GLUCOSE 100 (H) 10/21/2020   BUN 13 10/21/2020   CREATININE 0.59 10/21/2020   BILITOT 0.8 10/20/2020   ALKPHOS 21 (L) 10/20/2020   AST 32 10/20/2020   ALT 30 10/20/2020   PROT 5.0 (L) 10/20/2020   ALBUMIN 3.1 (L) 10/20/2020   CALCIUM 8.5 (L) 10/21/2020   ANIONGAP 6 10/21/2020   EGFR 94 10/19/2020   Lab Results  Component Value Date   CHOL 185 12/05/2011   Lab Results  Component Value Date   HDL 44 12/05/2011   Lab Results  Component Value Date   LDLCALC 110 12/05/2011   Lab Results  Component Value Date   TRIG 187 (A) 12/05/2011   No results found for: [facility] Lab Results  Component Value Date   HGBA1C 5.6 02/24/2015      Assessment & Plan:   Niani was seen today for hospitalization follow-up.  Diagnoses and all orders for this visit:  Acute on chronic blood loss anemia POC Hgb 8.1 today, improved from 7.4 at discharge. Labs pending as below. Does continue to experience  dyspnea with exertion, otherwise symptoms have resolved.  -     BMP8+EGFR -     Anemia Profile B -     Hemoglobin, fingerstick  Melena Resolved. Awaiting results of capsule study. Managed by GI.   Dyspnea on exertion Report unchanged. Recheck BNP today as it was previously elevated. Discussed could be symptoms of anemia as her recent CXR was negative for acute findings. Recommended follow up with cardiology for further evaluation in the setting of severe LVH and elevated BNP.  -     BMP8+EGFR -     Anemia Profile B -     Pro b natriuretic peptide  Primary hypertension Well controlled on current regimen.   Hospital discharge follow-up Reviewed ED records.   Follow-up: Return in about 6 weeks (around 12/06/2020) for follow up with PCP.  Follow up with cardiology, GI, and hematology.   The patient indicates understanding of these issues and agrees with the plan.   Gwenlyn Perking, FNP

## 2020-10-25 NOTE — Patient Instructions (Signed)
Shortness of Breath, Adult Shortness of breath is when a person has trouble breathing enough air or when a person feels like she or he is having trouble breathing in enough air.Shortness of breath could be a sign of a medical problem. Follow these instructions at home:  Pay attention to any changes in your symptoms. Do not use any products that contain nicotine or tobacco, such as cigarettes, e-cigarettes, and chewing tobacco. Do not smoke. Smoking is a common cause of shortness of breath. If you need help quitting, ask your health care provider. Avoid things that can irritate your airways, such as: Mold. Dust. Air pollution. Chemical fumes. Things that can cause allergy symptoms (allergens), if you have allergies. Keep your living space clean and free of mold and dust. Rest as needed. Slowly return to your usual activities. Take over-the-counter and prescription medicines only as told by your health care provider. This includes oxygen therapy and inhaled medicines. Keep all follow-up visits as told by your health care provider. This is important. Contact a health care provider if: Your condition does not improve as soon as expected. You have a hard time doing your normal activities, even after you rest. You have new symptoms. Get help right away if: Your shortness of breath gets worse. You have shortness of breath when you are resting. You feel light-headed or you faint. You have a cough that is not controlled with medicines. You cough up blood. You have pain with breathing. You have pain in your chest, arms, shoulders, or abdomen. You have a fever. You cannot walk up stairs or exercise the way that you normally do. These symptoms may represent a serious problem that is an emergency. Do not wait to see if the symptoms will go away. Get medical help right away. Call your local emergency services (911 in the [place] Do not drive yourself to the hospital. Summary Shortness of breath is  when a person has trouble breathing enough air. It can be a sign of a medical problem. Avoid things that irritate your lungs, such as smoking, pollution, mold, and dust. Pay attention to changes in your symptoms and contact your health care provider if you have a hard time completing daily activities because of shortness of breath. This information is not intended to replace advice given to you by your health care provider. Make sure you discuss any questions you have with your healthcare provider. Document Revised: 08/11/2017 Document Reviewed: 08/11/2017 Elsevier Patient Education  2022 [employer]  

## 2020-10-25 NOTE — Telephone Encounter (Signed)
PATIENT CALLED REQUESTING PROCEDURE RESULTS SHE HAD DONE Sunday [place]

## 2020-10-25 NOTE — Telephone Encounter (Signed)
Patient had capsule endoscopy performed while inpatient.  These results are not back yet.  We will wait until study has been read and then go from there.

## 2020-10-26 DIAGNOSIS — L300 Nummular dermatitis: Secondary | ICD-10-CM | POA: Diagnosis not present

## 2020-10-26 LAB — BMP8+EGFR
BUN/Creatinine Ratio: 9 — ABNORMAL LOW (ref 12–28)
BUN: 7 mg/dL — ABNORMAL LOW (ref 8–27)
CO2: 20 mmol/L (ref 20–29)
Calcium: 8.5 mg/dL — ABNORMAL LOW (ref 8.7–10.3)
Chloride: 104 mmol/L (ref 96–106)
Creatinine, Ser: 0.74 mg/dL (ref 0.57–1.00)
Glucose: 135 mg/dL — ABNORMAL HIGH (ref 65–99)
Potassium: 3.9 mmol/L (ref 3.5–5.2)
Sodium: 138 mmol/L (ref 134–144)
eGFR: 84 mL/min/{1.73_m2} (ref >59)

## 2020-10-26 LAB — ANEMIA PROFILE B
Basophils Absolute: 0 10*3/uL (ref 0.0–0.2)
Basos: 1 %
EOS (ABSOLUTE): 0.2 10*3/uL (ref 0.0–0.4)
Eos: 4 %
Ferritin: 286 ng/mL — ABNORMAL HIGH (ref 15–150)
Folate: 15.9 ng/mL (ref >3.0)
Hematocrit: 24.9 % — ABNORMAL LOW (ref 34.0–46.6)
Hemoglobin: 8 g/dL — CL (ref 11.1–15.9)
Immature Grans (Abs): 0 10*3/uL (ref 0.0–0.1)
Immature Granulocytes: 0 %
Iron Saturation: 16 % (ref 15–55)
Iron: 46 ug/dL (ref 27–139)
Lymphocytes Absolute: 1.3 10*3/uL (ref 0.7–3.1)
Lymphs: 27 %
MCH: 30.3 pg (ref 26.6–33.0)
MCHC: 32.1 g/dL (ref 31.5–35.7)
MCV: 94 fL (ref 79–97)
Monocytes Absolute: 0.4 10*3/uL (ref 0.1–0.9)
Monocytes: 9 %
Neutrophils Absolute: 3 10*3/uL (ref 1.4–7.0)
Neutrophils: 59 %
Platelets: 286 10*3/uL (ref 150–450)
RBC: 2.64 x10E6/uL — CL (ref 3.77–5.28)
RDW: 13.6 % (ref 11.7–15.4)
Retic Ct Pct: 9.3 % — ABNORMAL HIGH (ref 0.6–2.6)
Total Iron Binding Capacity: 289 ug/dL (ref 250–450)
UIBC: 243 ug/dL (ref 118–369)
Vitamin B-12: 1407 pg/mL — ABNORMAL HIGH (ref 232–1245)
WBC: 4.9 10*3/uL (ref 3.4–10.8)

## 2020-10-26 LAB — PRO B NATRIURETIC PEPTIDE: NT-Pro BNP: 1440 pg/mL — ABNORMAL HIGH (ref 0–738)

## 2020-10-26 NOTE — Telephone Encounter (Signed)
Noted  

## 2020-10-27 ENCOUNTER — Other Ambulatory Visit: Payer: Self-pay

## 2020-10-27 ENCOUNTER — Ambulatory Visit (INDEPENDENT_AMBULATORY_CARE_PROVIDER_SITE_OTHER): Payer: Medicare Other | Admitting: Adult Health

## 2020-10-27 ENCOUNTER — Encounter: Payer: Self-pay | Admitting: Adult Health

## 2020-10-27 VITALS — BP 128/76 | HR 59 | Ht 61.0 in | Wt 198.6 lb

## 2020-10-27 DIAGNOSIS — D509 Iron deficiency anemia, unspecified: Secondary | ICD-10-CM | POA: Diagnosis not present

## 2020-10-27 DIAGNOSIS — I10 Essential (primary) hypertension: Secondary | ICD-10-CM | POA: Diagnosis not present

## 2020-10-27 DIAGNOSIS — D508 Other iron deficiency anemias: Secondary | ICD-10-CM

## 2020-10-27 DIAGNOSIS — I422 Other hypertrophic cardiomyopathy: Secondary | ICD-10-CM

## 2020-10-27 DIAGNOSIS — I421 Obstructive hypertrophic cardiomyopathy: Secondary | ICD-10-CM | POA: Diagnosis not present

## 2020-10-27 MED ORDER — BISOPROLOL FUMARATE 5 MG PO TABS: 2.5000 mg | ORAL_TABLET | Freq: Every day | ORAL | 3 refills | Status: DC

## 2020-10-27 NOTE — Progress Notes (Signed)
Cardiology Office Note   Date:  10/27/2020   ID:  Stacey Reyes, Stacey Reyes 1945-06-22, MRN [id number]  PCP:  [name], DO  Cardiologist:  Dr. Percival Spanish  CC: Dyspnea     History of Present Illness: Stacey Reyes is a 75 y.o. female who presents for posthospitalization follow-up after being seen by Dr. Carlyle Dolly on consultation on 10/20/2020 for preop EGD.  The patient has a history of SVT, severe LVH with evidence of Sam, aortic valve murmur, with other history to include anemia, AVMs, B12 deficiency.  Severe LVH was noted on cardiac MRI with asymmetric septal hypertrophy.  PYP scan did not reveal any evidence of amyloid.  She is receiving iron transfusions and has had several episodes in the past of blood loss anemia with symptoms of dyspnea.  She has been taken off her blood pressure meds due to hypotension.  During hospitalization the patient's hemoglobin was 9.1, platelets 160, BNP 2974.  EKG revealed sinus rhythm with LVH with deeper T wave inversion inferior laterally which was similar to old EKG.  She was found to have some elevation in troponin at 175 which was felt to be related to demand ischemia in the setting of anemia.  She does have outflow of tract obstruction but did not have pathologic variant for HCM.  Per Dr.Branch, no plan for ischemic testing given likely etiology of her discomfort and shortness of breath is not ACS but anemia.  Given recurrent symptomatic anemia and transfusion she was not found to be a catheterization candidate.  She was recommended to proceed with GI procedures without further cardiac testing.  GI evaluation AVMs on capsule endoscopy right she had received blood transfusions during admission.  She comes today with continued complaints of shortness of breath.  She states sitting she is able to breathe well but any type of activity increases her dyspnea.  She enjoys dancing Visual merchandiser) and has had to stop because she is unable to breathe.  She  also states that when she is exerting herself she has some pain in both arms.  No PND, no orthopnea,.  Resting relieves symptoms. Past Medical History:  Diagnosis Date   Anemia    Cataract    removed bilateral   Chronic cystitis    Diverticulitis    Heart murmur    Hyperlipidemia    Hypertension    Personal history of colonic polyps-adenomas 01/07/2012   2009 - 2 diminutive adenomas (prior polyps also) 01/07/2012 - 2 diminutive adenomas      Past Surgical History:  Procedure Laterality Date   BREAST BIOPSY Right    No Scar seen    COLONOSCOPY  multiple   ESOPHAGOGASTRODUODENOSCOPY (EGD) WITH PROPOFOL N/A 11/23/2019   Procedure: ESOPHAGOGASTRODUODENOSCOPY (EGD) WITH PROPOFOL;  Surgeon: Daneil Dolin, MD;  [facility];  Service: Endoscopy;  Laterality: N/A;   ESOPHAGOGASTRODUODENOSCOPY (EGD) WITH PROPOFOL N/A 10/20/2020   Procedure: ESOPHAGOGASTRODUODENOSCOPY (EGD) WITH PROPOFOL;  Surgeon: Eloise Harman, DO;  [facility];  Service: Endoscopy;  Laterality: N/A;   GIVENS CAPSULE STUDY N/A 11/23/2019   Procedure: GIVENS CAPSULE STUDY;  Surgeon: Daneil Dolin, MD;  [facility];  Service: Endoscopy;  Laterality: N/A;   GIVENS CAPSULE STUDY N/A 10/22/2020   Procedure: GIVENS CAPSULE STUDY;  Surgeon: Eloise Harman, DO;  [facility];  Service: Endoscopy;  Laterality: N/A;     Current Outpatient Medications  Medication Sig Dispense Refill   Ascorbic Acid (VITAMIN C) 1000  MG tablet Take 1,000 mg by mouth daily.     Copper Gluconate (COPPER CAPS PO) Take 1 capsule by mouth daily.      cyanocobalamin ([employer] VITAMIN B12) 2000 MCG tablet Take 1 tablet (2,000 mcg total) by mouth daily.     ergocalciferol (VITAMIN D2) 1.25 MG (50000 UT) capsule Take 1 capsule (50,000 Units total) by mouth once a week. 16 capsule 3   Magnesium 200 MG TABS Take 1 tablet (200 mg total) by mouth daily. 30 tablet 6   Multiple Minerals-Vitamins  (CALCIUM-MAGNESIUM-ZINC-D3 PO) Take 1 tablet by mouth daily.     Multiple Vitamin (MULTIVITAMIN) capsule Take 1 capsule by mouth daily.      pantoprazole (PROTONIX) 40 MG tablet Take 1 tablet (40 mg total) by mouth 2 (two) times daily before a meal. (NEEDS TO BE SEEN BEFORE NEXT REFILL) 60 tablet 5   rosuvastatin (CRESTOR) 10 MG tablet TAKE 1 TABLET BY MOUTH AT  BEDTIME 90 tablet 3   vitamin E 400 UNIT capsule Take 400 Units by mouth daily.      bisoprolol (ZEBETA) 5 MG tablet Take 0.5 tablets (2.5 mg total) by mouth daily. 45 tablet 3   No current facility-administered medications for this visit.    Allergies:   Sulfa antibiotics    Social History:  The patient  reports that she quit smoking about 35 years ago. Her smoking use included cigarettes. She has never used smokeless tobacco. She reports current alcohol use. She reports that she does not use drugs.   Family History:  The patient's family history includes Asthma in her brother; Breast cancer in her sister; Colon cancer (age of onset: 84) in her mother.    ROS: All other systems are reviewed and negative. Unless otherwise mentioned in H&P    PHYSICAL EXAM: VS:  BP 128/76   Pulse (!) 59   Ht '5\' 1"'$  (1.549 m)   Wt 198 lb 9.6 oz (90.1 kg)   SpO2 95%   BMI 37.53 kg/m  , BMI Body mass index is 37.53 kg/m. GEN: Well nourished, well developed, in no acute distress HEENT: normal Neck: mild JVD, soft carotid bruit on the right, no masses Cardiac:RRR; harsh 2/6 systolic murmur heard best at the right sternal border, 1/6 systolic murmur at the apex, no rubs, or gallops,no edema  Respiratory:  Clear to auscultation bilaterally, normal work of breathing, slight crackles in the left lower lobe no wheezing, GI: soft, nontender, nondistended, + BS MS: no deformity or atrophy Skin: warm and dry, no rash Neuro:  Strength and sensation are intact Psych: euthymic mood, full affect   EKG:  EKG is ordered today.  Personally reviewed The  ekg ordered today demonstrates sinus bradycardia with first-degree AV block with PR interval 0.224 ms, ventricular rate of 59 bpm, left atrial enlargement is noted with LVH and deep T waves in V6.   Recent Labs: 11/22/2019: B Natriuretic Peptide 239.0 03/08/2020: TSH 1.440 10/20/2020: ALT 30; Magnesium 2.0 10/25/2020: BUN 7; Creatinine, Ser 0.74; Hemoglobin 8.0; NT-Pro BNP 1,440; Platelets 286; Potassium 3.9; Sodium 138    Lipid Panel    Component Value Date/Time   CHOL 185 12/05/2011 0000   TRIG 187 (A) 12/05/2011 0000   HDL 44 12/05/2011 0000   LDLCALC 110 12/05/2011 0000      Wt Readings from Last 3 Encounters:  10/27/20 198 lb 9.6 oz (90.1 kg)  10/25/20 198 lb (89.8 kg)  10/19/20 198 lb 6.6 oz (90 kg)  Other studies Reviewed: Cardiac MRI 07/2019  IMPRESSION: 1.  No evidence of cardiac amyloidosis   2. Asymmetric hypertrophy measuring up to 97m in basal septum (166min posterior wall), consistent with hypertrophic cardiomyopathy   3. LVOT obstruction due to systolic anterior motion of the anterior leaflet of the mitral valve, consistent with HOCM   4.  No late gadolinium enhancement to suggest focal myocardial scar   5.  Normal LV size with hyperdynamic systolic function (EF [id number]  6.  Normal RV size and systolic function (EF [id number]  CTA of chest in 2020 with Coronary, aortic arch, and branch vessel atherosclerotic vascular disease     ECHO 03/24/2019  IMPRESSIONS     1. Left ventricular ejection fraction, by visual estimation, is >75%. The  left ventricle has hyperdynamic function. There is severely increased left  ventricular hypertrophy.   2. Elevated left atrial pressure.   3. Left ventricular diastolic parameters are consistent with Grade I  diastolic dysfunction (impaired relaxation).   4. The left ventricle has no regional wall motion abnormalities.   5. There is a dynamic LVOT gradient in the setting of severe symmetricla  hypertrophy and  hyperdynamic LV function leading to [facility] the anterior  mitral valve leaflet. Difficult to discern the peak graident,  approximately appears to be 20 mmHg with  Valsalva.   6. Global right ventricle has normal systolic function.The right  ventricular size is normal. No increase in right ventricular wall  thickness.   7. Left atrial size was severely dilated.   8. Right atrial size was normal.   9. The mitral valve is normal in structure. Mild mitral valve  regurgitation. No evidence of mitral stenosis.  10. The tricuspid valve is normal in structure.  11. The aortic valve is tricuspid. Aortic valve regurgitation is not  visualized. Mild aortic valve stenosis.  12. The pulmonic valve was not well visualized. Pulmonic valve  regurgitation is not visualized.  13. The inferior vena cava is normal in size with greater than 50%  respiratory variability, suggesting right atrial pressure of 3 mmHg..    ASSESSMENT AND PLAN:  1.  Hypertrophic cardiomyopathy with HOCM: Most recent echo and 2021 revealed hyperdynamic function with severe LVH, SAM, and HOCM.  She is symptomatic and unable to do minimal exertion without significant shortness of breath.  Heart rate is well controlled on bisoprolol.  She is not on any ACE inhibitor's or diuretics to prevent decreased preload.  I am going to refer her to advanced heart failure clinic for establishment and any more recommendations at this point.  Uncertain if she is a surgical candidate or septal ablation.  For now continue heart rate control with bisoprolol.  I will repeat her echocardiogram for most recent assessment of LVOT gradient and LV size and function.  I have also given her patient education verbally and in writing concerning hypertrophic cardiomyopathy.  Questions have been answered.  2.  Chronic anemia: Being followed by hematology.  She is on iron replacement therapy.  GI has seen her during recent hospitalization with no active bleeding but  with AVMs.  3.  SVT: She is not complaining of any rapid heart rates at this time.  Continue beta-blocker.  4.  Hypertension: Currently well controlled.  Not a candidate for ACE or ARB currently.  Current medicines are reviewed at length with the patient today.  I have spent 25 mins dedicated to the care of this patient on the date of this encounter  to include pre-visit review of records, assessment, management and diagnostic testing,with shared decision making.  Labs/ tests ordered today include: Echocardiogram Phill Myron. [place] Pugh, ANP, AACC   10/27/2020 9:20 AM    [facility] [address] Office [phone] Fax [phone]  Notice: This dictation was prepared with Dragon dictation along with smaller phrase technology. Any transcriptional errors that result from this process are unintentional and may not be corrected upon review.

## 2020-10-27 NOTE — Patient Instructions (Signed)
Medication Instructions:  No Changes *If you need a refill on your cardiac medications before your next appointment, please call your pharmacy*   Lab Work: No labs If you have labs (blood work) drawn today and your tests are completely normal, you will receive your results only by: [place] (if you have MyChart) OR A paper copy in the mail If you have any lab test that is abnormal or we need to change your treatment, we will call you to review the results.   Testing/Procedures: [address], [place] has requested that you have an echocardiogram. Echocardiography is a painless test that uses sound waves to create images of your heart. It provides your doctor with information about the size and shape of your heart and how well your heart's chambers and valves are working. This procedure takes approximately one hour. There are no restrictions for this procedure.    Follow-Up: At [facility], you and your health needs are our priority.  As part of our continuing mission to provide you with exceptional heart care, we have created designated Provider Care Teams.  These Care Teams include your primary Cardiologist (physician) and Advanced Practice Providers (APPs -  Physician Assistants and Nurse Practitioners) who all work together to provide you with the care you need, when you need it.     Your next appointment:   3 month(s)  The format for your next appointment:   In Person  Provider:   Minus Breeding, MD

## 2020-10-30 ENCOUNTER — Other Ambulatory Visit: Payer: Self-pay

## 2020-10-30 ENCOUNTER — Inpatient Hospital Stay (HOSPITAL_COMMUNITY): Payer: Medicare Other | Attending: Hematology

## 2020-10-30 DIAGNOSIS — K921 Melena: Secondary | ICD-10-CM | POA: Insufficient documentation

## 2020-10-30 DIAGNOSIS — D500 Iron deficiency anemia secondary to blood loss (chronic): Secondary | ICD-10-CM | POA: Insufficient documentation

## 2020-10-30 DIAGNOSIS — D509 Iron deficiency anemia, unspecified: Secondary | ICD-10-CM

## 2020-10-30 DIAGNOSIS — E538 Deficiency of other specified B group vitamins: Secondary | ICD-10-CM | POA: Diagnosis not present

## 2020-10-30 LAB — CBC WITH DIFFERENTIAL/PLATELET
Abs Immature Granulocytes: 0.01 10*3/uL (ref 0.00–0.07)
Basophils Absolute: 0 10*3/uL (ref 0.0–0.1)
Basophils Relative: 1 %
Eosinophils Absolute: 0.2 10*3/uL (ref 0.0–0.5)
Eosinophils Relative: 3 %
HCT: 28.2 % — ABNORMAL LOW (ref 36.0–46.0)
Hemoglobin: 8.7 g/dL — ABNORMAL LOW (ref 12.0–15.0)
Immature Granulocytes: 0 %
Lymphocytes Relative: 32 %
Lymphs Abs: 1.8 10*3/uL (ref 0.7–4.0)
MCH: 30.3 pg (ref 26.0–34.0)
MCHC: 30.9 g/dL (ref 30.0–36.0)
MCV: 98.3 fL (ref 80.0–100.0)
Monocytes Absolute: 0.6 10*3/uL (ref 0.1–1.0)
Monocytes Relative: 10 %
Neutro Abs: 3 10*3/uL (ref 1.7–7.7)
Neutrophils Relative %: 54 %
Platelets: 334 10*3/uL (ref 150–400)
RBC: 2.87 MIL/uL — ABNORMAL LOW (ref 3.87–5.11)
RDW: 15.3 % (ref 11.5–15.5)
WBC: 5.5 10*3/uL (ref 4.0–10.5)
nRBC: 0 % (ref 0.0–0.2)

## 2020-10-30 LAB — VITAMIN B12: Vitamin B-12: 874 pg/mL (ref 180–914)

## 2020-10-30 LAB — IRON AND TIBC
Iron: 43 ug/dL (ref 28–170)
Saturation Ratios: 12 % (ref 10.4–31.8)
TIBC: 365 ug/dL (ref 250–450)
UIBC: 322 ug/dL

## 2020-10-30 LAB — FERRITIN: Ferritin: 110 ng/mL (ref 11–307)

## 2020-10-31 ENCOUNTER — Other Ambulatory Visit (HOSPITAL_COMMUNITY): Payer: Medicare Other

## 2020-11-01 LAB — METHYLMALONIC ACID, SERUM: Methylmalonic Acid, Quantitative: 138 nmol/L (ref 0–378)

## 2020-11-02 ENCOUNTER — Encounter (HOSPITAL_COMMUNITY): Payer: Self-pay | Admitting: *Deleted

## 2020-11-05 ENCOUNTER — Other Ambulatory Visit: Payer: Self-pay | Admitting: Family Medicine

## 2020-11-06 NOTE — Progress Notes (Signed)
[place] [place], [place] [address]   CLINIC:  Medical Oncology/Hematology  PCP:  Stacey Norlander, DO [place] [id number] [phone]   REASON FOR VISIT:  Follow-up for iron deficiency  CURRENT THERAPY: Intermittent IV iron infusions (Last Feraheme on 08/04/2020 and 08/11/2020)  INTERVAL HISTORY:  [name] 75 y.o. female returns for routine follow-up of her iron deficiency and anemia.  She was last seen by Stacey Abernethy PA-C on 08/02/2020.  She last received IV Feraheme on 08/04/2020 and 08/11/2020.  At today's visit, she reports feeling somewhat poor due to extreme fatigue.  No recent hospitalizations, surgeries, or changes in baseline health status.  She was hospitalized 10/19/2020 through 10/23/2018 due to acute on chronic anemia with melena, exertional dyspnea, and fatigue.  She received IV iron infusion while hospitalized on 10/22/2020.  She reports that she had melena x1 to 2 weeks, which resolved about 2 weeks ago.  She does report significant fatigue and worsening dyspnea on exertion ever since this time.  (She reports that she is also being worked up by cardiology for dyspnea on exertion due to finding of left ventricular hypertrophy.)  Her symptoms have usually improved after IV iron, but she reports that she did not feel any better after the IV iron she received during her hospital stay.  She is very able to keep up with her ADLs, and reports that she has to lay down to take a nap almost every 3 hours.  She is experiencing shortness of breath with minimal exertion.  No chest pain, palpitations, or syncope.  She has 10% energy and 100% appetite. She endorses that she is maintaining a stable weight.    REVIEW OF SYSTEMS:  Review of Systems  Constitutional:  Positive for fatigue (Severe). Negative for appetite change, chills, diaphoresis, fever and unexpected weight change.  HENT:   Negative for lump/mass and nosebleeds.    Eyes:  Negative for eye problems.  Respiratory:  Positive for shortness of breath (With minimal exertion). Negative for cough and hemoptysis.   Cardiovascular:  Negative for chest pain, leg swelling and palpitations.  Gastrointestinal:  Negative for abdominal pain, blood in stool, constipation, diarrhea, nausea and vomiting.  Genitourinary:  Negative for hematuria.   Skin: Negative.   Neurological:  Positive for numbness (Occasional numbness in fingers). Negative for dizziness, headaches and light-headedness.  Hematological:  Does not bruise/bleed easily.  Psychiatric/Behavioral:  Positive for sleep disturbance.      PAST MEDICAL/SURGICAL HISTORY:  Past Medical History:  Diagnosis Date   Anemia    Cataract    removed bilateral   Chronic cystitis    Diverticulitis    Heart murmur    Hyperlipidemia    Hypertension    Personal history of colonic polyps-adenomas 01/07/2012   2009 - 2 diminutive adenomas (prior polyps also) 01/07/2012 - 2 diminutive adenomas     Past Surgical History:  Procedure Laterality Date   BREAST BIOPSY Right    No Scar seen    COLONOSCOPY  multiple   ESOPHAGOGASTRODUODENOSCOPY (EGD) WITH PROPOFOL N/A 11/23/2019   Procedure: ESOPHAGOGASTRODUODENOSCOPY (EGD) WITH PROPOFOL;  Surgeon: Daneil Dolin, MD;  [facility];  Service: Endoscopy;  Laterality: N/A;   ESOPHAGOGASTRODUODENOSCOPY (EGD) WITH PROPOFOL N/A 10/20/2020   Procedure: ESOPHAGOGASTRODUODENOSCOPY (EGD) WITH PROPOFOL;  Surgeon: Eloise Harman, DO;  [facility];  Service: Endoscopy;  Laterality: N/A;   GIVENS CAPSULE STUDY N/A 11/23/2019   Procedure: GIVENS CAPSULE  STUDY;  Surgeon: Daneil Dolin, MD;  [facility];  Service: Endoscopy;  Laterality: N/A;   GIVENS CAPSULE STUDY N/A 10/22/2020   Procedure: GIVENS CAPSULE STUDY;  Surgeon: Eloise Harman, DO;  [facility];  Service: Endoscopy;  Laterality: N/A;     SOCIAL HISTORY:  Social History    Socioeconomic History   Marital status: Married    Spouse name: Not on file   Number of children: 1   Years of education: Not on file   Highest education level: Not on file  Occupational History   Occupation: retired  Tobacco Use   Smoking status: Former    Types: Cigarettes    Quit date: 12/23/1984    Years since quitting: 35.8   Smokeless tobacco: Never  Vaping Use   Vaping Use: Never used  Substance and Sexual Activity   Alcohol use: Yes    Comment: rare   Drug use: No   Sexual activity: Not on file  Other Topics Concern   Not on file  Social History Narrative   Patient is married and retired and has 1 grown child   Social Determinants of Radio broadcast assistant Strain: Not on file  Food Insecurity: Not on file  Transportation Needs: Not on file  Physical Activity: Not on file  Stress: Not on file  Social Connections: Not on file  Intimate Partner Violence: Not on file    FAMILY HISTORY:  Family History  Problem Relation Age of Onset   Colon cancer Mother 27       80's   Breast cancer Sister    Asthma Brother    Colon polyps Neg Hx    Esophageal cancer Neg Hx    Rectal cancer Neg Hx    Stomach cancer Neg Hx     CURRENT MEDICATIONS:  Outpatient Encounter Medications as of 11/07/2020  Medication Sig   Ascorbic Acid (VITAMIN C) 1000 MG tablet Take 1,000 mg by mouth daily.   bisoprolol (ZEBETA) 5 MG tablet Take 0.5 tablets (2.5 mg total) by mouth daily.   Copper Gluconate (COPPER CAPS PO) Take 1 capsule by mouth daily.    cyanocobalamin ([employer]S VITAMIN B12) 2000 MCG tablet Take 1 tablet (2,000 mcg total) by mouth daily.   ergocalciferol (VITAMIN D2) 1.25 MG (50000 UT) capsule Take 1 capsule (50,000 Units total) by mouth once a week.   Magnesium 200 MG TABS Take 1 tablet (200 mg total) by mouth daily.   Multiple Minerals-Vitamins (CALCIUM-MAGNESIUM-ZINC-D3 PO) Take 1 tablet by mouth daily.   Multiple Vitamin (MULTIVITAMIN) capsule Take 1 capsule by mouth  daily.    pantoprazole (PROTONIX) 40 MG tablet Take 1 tablet (40 mg total) by mouth 2 (two) times daily before a meal. (NEEDS TO BE SEEN BEFORE NEXT REFILL)   rosuvastatin (CRESTOR) 10 MG tablet TAKE 1 TABLET BY MOUTH AT  BEDTIME   vitamin E 400 UNIT capsule Take 400 Units by mouth daily.    No facility-administered encounter medications on file as of 11/07/2020.    ALLERGIES:  Allergies  Allergen Reactions   Sulfa Antibiotics Rash     PHYSICAL EXAM:  ECOG PERFORMANCE STATUS: 2 - Symptomatic, <50% confined to bed  There were no vitals filed for this visit. There were no vitals filed for this visit. Physical Exam Constitutional:      Appearance: Normal appearance.  HENT:     Head: Normocephalic and atraumatic.     Mouth/Throat:     Mouth:  Mucous membranes are moist.  Eyes:     Extraocular Movements: Extraocular movements intact.     Pupils: Pupils are equal, round, and reactive to light.  Cardiovascular:     Rate and Rhythm: Normal rate and regular rhythm.     Pulses: Normal pulses.     Heart sounds: Murmur heard.  Pulmonary:     Effort: Pulmonary effort is normal.     Breath sounds: Normal breath sounds.  Abdominal:     General: Bowel sounds are normal.     Palpations: Abdomen is soft.     Tenderness: There is no abdominal tenderness.  Musculoskeletal:        General: No swelling.     Right lower leg: No edema.     Left lower leg: No edema.  Lymphadenopathy:     Cervical: No cervical adenopathy.  Skin:    General: Skin is warm and dry.  Neurological:     General: No focal deficit present.     Mental Status: She is alert and oriented to person, place, and time.  Psychiatric:        Mood and Affect: Mood normal.        Behavior: Behavior normal.     LABORATORY DATA:  I have reviewed the labs as listed.  CBC    Component Value Date/Time   WBC 5.5 10/30/2020 1022   RBC 2.87 (L) 10/30/2020 1022   HGB 8.7 (L) 10/30/2020 1022   HGB 8.0 (LL) 10/25/2020 1112    HCT 28.2 (L) 10/30/2020 1022   HCT 24.9 (L) 10/25/2020 1112   PLT 334 10/30/2020 1022   PLT 286 10/25/2020 1112   MCV 98.3 10/30/2020 1022   MCV 94 10/25/2020 1112   MCH 30.3 10/30/2020 1022   MCHC 30.9 10/30/2020 1022   RDW 15.3 10/30/2020 1022   RDW 13.6 10/25/2020 1112   LYMPHSABS 1.8 10/30/2020 1022   LYMPHSABS 1.3 10/25/2020 1112   MONOABS 0.6 10/30/2020 1022   EOSABS 0.2 10/30/2020 1022   EOSABS 0.2 10/25/2020 1112   BASOSABS 0.0 10/30/2020 1022   BASOSABS 0.0 10/25/2020 1112   CMP Latest Ref Rng & Units 10/25/2020 10/21/2020 10/20/2020  Glucose 65 - 99 mg/dL 135(H) 100(H) 108(H)  BUN 8 - 27 mg/dL 7(L) 13 20  Creatinine 0.57 - 1.00 mg/dL 0.74 0.59 0.61  Sodium 134 - 144 mmol/L 138 138 138  Potassium 3.5 - 5.2 mmol/L 3.9 3.6 3.7  Chloride 96 - 106 mmol/L 104 107 108  CO2 20 - 29 mmol/L '20 25 25  '$ Calcium 8.7 - 10.3 mg/dL 8.5(L) 8.5(L) 8.2(L)  Total Protein 6.5 - 8.1 g/dL - - 5.0(L)  Total Bilirubin 0.3 - 1.2 mg/dL - - 0.8  Alkaline Phos 38 - 126 U/L - - 21(L)  AST 15 - 41 U/L - - 32  ALT 0 - 44 U/L - - 30    DIAGNOSTIC IMAGING:  I have independently reviewed the relevant imaging and discussed with the patient.  ASSESSMENT & PLAN: 1.  Iron deficiency anemia secondary to chronic blood loss -Etiology of anemia is blood loss, multiple stool occult blood tests have been positive, suspected to be due to small bowel AVMs.  No improvement despite taking iron tablet twice daily. - Admitted on 03/24/2019 with hemoglobin 5.3 and ferritin of 6 and B12 of 145.  Status post 3 units PRBC and 1 infusion of Feraheme on 03/24/2019. - Colonoscopy (11/17/2018) showed 2 diminutive polyps in the transverse colon, diverticulosis in the sigmoid colon  and descending colon transverse colon. - EGD (11/23/2019) showed duodenal erosions, more recent EGD (10/20/2020) essentially normal - Capsule endoscopy (01/31/2020): Duodenitis, AVM x2 - Normal SPEP (January 2021).  Normal renal function on last CMP. -  Most recent Feraheme 08/04/2020 and 08/11/2020 - She was hospitalized 10/19/2020 through 10/23/2018 due to acute on chronic anemia (Hgb 7.4) with melena, exertional dyspnea, and fatigue.  She received IV iron infusion (Ferlicit 0000000 mg) while hospitalized on 10/22/2020. - Reports melena x1 to 2 weeks prior to the above hospital stay - Symptomatic with dyspnea on minimal exertion and severe fatigue - Most recent labs (10/30/2020): Hgb 8.7 with MCV 98.3, ferritin 110, serum iron 43 with 12% saturation - PLAN:  IV Feraheme x2 due to persistent anemia and rapidly decreasing ferritin (received IV Ferrlecit 125 mg while hospitalized on 10/22/2020).  We will repeat CBC with sample to blood bank every 2 weeks x2 due to concern for continued blood loss, transfuse if Hgb < 7.0, or if Hgb < 8.0 and severely symptomatic.  RTC in 2 months for repeat lab panel and office visit, or sooner if needed.  2.  B12 deficiency - She is taking B12 tablet 1000 mcg daily - Most recent labs (10/30/2020): B12 874, methylmalonic acid 138 - PLAN: Continue B12 supplement.  We will recheck levels every 6 to 12 months.   PLAN SUMMARY & DISPOSITION: - Feraheme x2 (first dose ASAP) - CBC next week + repeat CBC in 3 weeks (include SAMPLE TO BLOOD BANK) - will transfuse if Hgb < 7.0 or Hgb < 8.0 with severe symptoms - Full lab panel (CBC and iron) in 2 months - Phone visit after labs  All questions were answered. The patient knows to call the clinic with any problems, questions or concerns.  Medical decision making: Moderate  Time spent on visit: I spent 20 minutes counseling the patient face to face. The total time spent in the appointment was 30 minutes and more than 50% was on counseling.   Harriett Rush, PA-C  11/07/2020 11:35 AM

## 2020-11-07 ENCOUNTER — Other Ambulatory Visit: Payer: Self-pay

## 2020-11-07 ENCOUNTER — Inpatient Hospital Stay (HOSPITAL_COMMUNITY): Payer: Medicare Other | Admitting: Physician Assistant

## 2020-11-07 VITALS — BP 123/49 | HR 62 | Temp 98.6°F | Resp 16 | Wt 199.0 lb

## 2020-11-07 DIAGNOSIS — K921 Melena: Secondary | ICD-10-CM | POA: Diagnosis not present

## 2020-11-07 DIAGNOSIS — D500 Iron deficiency anemia secondary to blood loss (chronic): Secondary | ICD-10-CM | POA: Diagnosis not present

## 2020-11-07 DIAGNOSIS — D509 Iron deficiency anemia, unspecified: Secondary | ICD-10-CM | POA: Diagnosis not present

## 2020-11-07 DIAGNOSIS — E538 Deficiency of other specified B group vitamins: Secondary | ICD-10-CM | POA: Diagnosis not present

## 2020-11-07 NOTE — Patient Instructions (Addendum)
[place] at [facility] Discharge Instructions  You were seen today by Tarri Abernethy PA-C for your iron deficiency anemia related to chronic GI blood loss.  We will treat you with IV iron (Feraheme) x2 doses.  We will repeat full lab panel in 2 months with a phone visit after labs to discuss next steps.  We will also check your blood counts every 2 weeks for the next month to make sure that you do not continue to lose blood or develop the need for blood transfusions.  LABS: Return in 1 week for repeat blood count  OTHER TESTS: None  MEDICATIONS: IV Feraheme x2  FOLLOW-UP APPOINTMENT: Phone visit in 2 months   Thank you for choosing [place] at [facility] to provide your oncology and hematology care.  To afford each patient quality time with our provider, please arrive at least 15 minutes before your scheduled appointment time.   If you have a lab appointment with the [facility] please come in thru the Main Entrance and check in at the main information desk.  You need to re-schedule your appointment should you arrive 10 or more minutes late.  We strive to give you quality time with our providers, and arriving late affects you and other patients whose appointments are after yours.  Also, if you no show three or more times for appointments you may be dismissed from the clinic at the providers discretion.     Again, thank you for choosing [facility].  Our hope is that these requests will decrease the amount of time that you wait before being seen by our physicians.       _____________________________________________________________  Should you have questions after your visit to [facility], please contact our office at [phone] and follow the prompts.  Our office hours are 8:00 a.m. and 4:30 p.m. Monday - Friday.  Please note that voicemails left after 4:00 p.m. may not be returned until the following  business day.  We are closed weekends and major holidays.  You do have access to a nurse 24-7, just call the main number to the clinic [phone] and do not press any options, hold on the line and a nurse will answer the phone.    For prescription refill requests, have your pharmacy contact our office and allow 72 hours.    Due to Covid, you will need to wear a mask upon entering the hospital. If you do not have a mask, a mask will be given to you at the Main Entrance upon arrival. For doctor visits, patients may have 1 support person age 73 or older with them. For treatment visits, patients can not have anyone with them due to social distancing guidelines and our immunocompromised population.

## 2020-11-09 ENCOUNTER — Other Ambulatory Visit: Payer: Self-pay

## 2020-11-09 ENCOUNTER — Inpatient Hospital Stay (HOSPITAL_COMMUNITY): Payer: Medicare Other

## 2020-11-09 VITALS — BP 134/76 | HR 63 | Temp 97.0°F | Resp 18

## 2020-11-09 DIAGNOSIS — D509 Iron deficiency anemia, unspecified: Secondary | ICD-10-CM

## 2020-11-09 DIAGNOSIS — E538 Deficiency of other specified B group vitamins: Secondary | ICD-10-CM | POA: Diagnosis not present

## 2020-11-09 DIAGNOSIS — D500 Iron deficiency anemia secondary to blood loss (chronic): Secondary | ICD-10-CM

## 2020-11-09 DIAGNOSIS — K921 Melena: Secondary | ICD-10-CM | POA: Diagnosis not present

## 2020-11-09 MED ORDER — SODIUM CHLORIDE 0.9 % IV SOLN: Freq: Once | INTRAVENOUS | Status: AC

## 2020-11-09 MED ORDER — SODIUM CHLORIDE 0.9 % IV SOLN
510.0000 mg | Freq: Once | INTRAVENOUS | Status: AC
  Administered 2020-11-09: 510 mg via INTRAVENOUS
  Filled 2020-11-09: qty 510

## 2020-11-09 MED ORDER — ACETAMINOPHEN 325 MG PO TABS
650.0000 mg | ORAL_TABLET | Freq: Once | ORAL | Status: AC
  Administered 2020-11-09: 650 mg via ORAL
  Filled 2020-11-09: qty 2

## 2020-11-09 MED ORDER — LORATADINE 10 MG PO TABS
10.0000 mg | ORAL_TABLET | Freq: Once | ORAL | Status: AC
  Administered 2020-11-09: 10 mg via ORAL
  Filled 2020-11-09: qty 1

## 2020-11-09 NOTE — Progress Notes (Signed)
Pt presents today for Feraheme infusion per provider's order. Vital signs stable and pt voiced no new complaints at this time.   Peripheral IV started with good blood return pre and post infusion.  Feraheme given today per MD orders. Tolerated infusion without adverse affects. Vital signs stable. No complaints at this time. Discharged from clinic ambulatory in stable condition. Alert and oriented x 3. F/U with [facility] as scheduled.

## 2020-11-09 NOTE — Patient Instructions (Signed)
[place]  Discharge Instructions: Thank you for choosing [place] to provide your oncology and hematology care.  If you have a lab appointment with the [facility], please come in thru the Main Entrance and check in at the main information desk.  Wear comfortable clothing and clothing appropriate for easy access to any Portacath or PICC line.   We strive to give you quality time with your provider. You may need to reschedule your appointment if you arrive late (15 or more minutes).  Arriving late affects you and other patients whose appointments are after yours.  Also, if you miss three or more appointments without notifying the office, you may be dismissed from the clinic at the provider's discretion.      For prescription refill requests, have your pharmacy contact our office and allow 72 hours for refills to be completed.    Today you received IV iron infusion Feraheme.     BELOW ARE SYMPTOMS THAT SHOULD BE REPORTED IMMEDIATELY: *FEVER GREATER THAN 100.4 F (38 C) OR HIGHER *CHILLS OR SWEATING *NAUSEA AND VOMITING THAT IS NOT CONTROLLED WITH YOUR NAUSEA MEDICATION *UNUSUAL SHORTNESS OF BREATH *UNUSUAL BRUISING OR BLEEDING *URINARY PROBLEMS (pain or burning when urinating, or frequent urination) *BOWEL PROBLEMS (unusual diarrhea, constipation, pain near the anus) TENDERNESS IN MOUTH AND THROAT WITH OR WITHOUT PRESENCE OF ULCERS (sore throat, sores in mouth, or a toothache) UNUSUAL RASH, SWELLING OR PAIN  UNUSUAL VAGINAL DISCHARGE OR ITCHING   Items with * indicate a potential emergency and should be followed up as soon as possible or go to the Emergency Department if any problems should occur.  Please show the CHEMOTHERAPY ALERT CARD or IMMUNOTHERAPY ALERT CARD at check-in to the Emergency Department and triage nurse.  Should you have questions after your visit or need to cancel or reschedule your appointment, please contact [facility]  [phone]  and follow the prompts.  Office hours are 8:00 a.m. to 4:30 p.m. Monday - Friday. Please note that voicemails left after 4:00 p.m. may not be returned until the following business day.  We are closed weekends and major holidays. You have access to a nurse at all times for urgent questions. Please call the main number to the clinic [phone] and follow the prompts.  For any non-urgent questions, you may also contact your provider using MyChart. We now offer e-Visits for anyone 73 and older to request care online for non-urgent symptoms. For details visit [email or web address].   Also download the MyChart app! Go to the app store, search "MyChart", open the app, select [place], and log in with your MyChart username and password.  Due to Covid, a mask is required upon entering the hospital/clinic. If you do not have a mask, one will be given to you upon arrival. For doctor visits, patients may have 1 support person aged 54 or older with them. For treatment visits, patients cannot have anyone with them due to current Covid guidelines and our immunocompromised population.

## 2020-11-16 ENCOUNTER — Ambulatory Visit (HOSPITAL_COMMUNITY): Payer: Medicare Other

## 2020-11-16 ENCOUNTER — Other Ambulatory Visit (HOSPITAL_COMMUNITY): Payer: Self-pay | Admitting: *Deleted

## 2020-11-16 ENCOUNTER — Other Ambulatory Visit (HOSPITAL_COMMUNITY): Payer: Medicare Other

## 2020-11-16 DIAGNOSIS — D500 Iron deficiency anemia secondary to blood loss (chronic): Secondary | ICD-10-CM

## 2020-11-17 ENCOUNTER — Other Ambulatory Visit: Payer: Self-pay

## 2020-11-17 ENCOUNTER — Inpatient Hospital Stay (HOSPITAL_COMMUNITY): Payer: Medicare Other

## 2020-11-17 VITALS — BP 128/68 | HR 64 | Temp 98.0°F | Resp 18

## 2020-11-17 DIAGNOSIS — K921 Melena: Secondary | ICD-10-CM | POA: Diagnosis not present

## 2020-11-17 DIAGNOSIS — D509 Iron deficiency anemia, unspecified: Secondary | ICD-10-CM

## 2020-11-17 DIAGNOSIS — D500 Iron deficiency anemia secondary to blood loss (chronic): Secondary | ICD-10-CM

## 2020-11-17 DIAGNOSIS — E538 Deficiency of other specified B group vitamins: Secondary | ICD-10-CM | POA: Diagnosis not present

## 2020-11-17 LAB — CBC WITH DIFFERENTIAL/PLATELET
Abs Immature Granulocytes: 0.02 10*3/uL (ref 0.00–0.07)
Basophils Absolute: 0.1 10*3/uL (ref 0.0–0.1)
Basophils Relative: 1 %
Eosinophils Absolute: 0.2 10*3/uL (ref 0.0–0.5)
Eosinophils Relative: 4 %
HCT: 30.6 % — ABNORMAL LOW (ref 36.0–46.0)
Hemoglobin: 9.5 g/dL — ABNORMAL LOW (ref 12.0–15.0)
Immature Granulocytes: 0 %
Lymphocytes Relative: 26 %
Lymphs Abs: 1.3 10*3/uL (ref 0.7–4.0)
MCH: 30.3 pg (ref 26.0–34.0)
MCHC: 31 g/dL (ref 30.0–36.0)
MCV: 97.5 fL (ref 80.0–100.0)
Monocytes Absolute: 0.5 10*3/uL (ref 0.1–1.0)
Monocytes Relative: 10 %
Neutro Abs: 3.1 10*3/uL (ref 1.7–7.7)
Neutrophils Relative %: 59 %
Platelets: 187 10*3/uL (ref 150–400)
RBC: 3.14 MIL/uL — ABNORMAL LOW (ref 3.87–5.11)
RDW: 15.5 % (ref 11.5–15.5)
WBC: 5.2 10*3/uL (ref 4.0–10.5)
nRBC: 0 % (ref 0.0–0.2)

## 2020-11-17 LAB — IRON AND TIBC
Iron: 47 ug/dL (ref 28–170)
Saturation Ratios: 13 % (ref 10.4–31.8)
TIBC: 353 ug/dL (ref 250–450)
UIBC: 306 ug/dL

## 2020-11-17 LAB — FERRITIN: Ferritin: 236 ng/mL (ref 11–307)

## 2020-11-17 LAB — SAMPLE TO BLOOD BANK

## 2020-11-17 MED ORDER — ACETAMINOPHEN 325 MG PO TABS
650.0000 mg | ORAL_TABLET | Freq: Once | ORAL | Status: AC
  Administered 2020-11-17: 650 mg via ORAL
  Filled 2020-11-17: qty 2

## 2020-11-17 MED ORDER — SODIUM CHLORIDE 0.9 % IV SOLN: Freq: Once | INTRAVENOUS | Status: AC

## 2020-11-17 MED ORDER — SODIUM CHLORIDE 0.9 % IV SOLN
510.0000 mg | Freq: Once | INTRAVENOUS | Status: AC
  Administered 2020-11-17: 510 mg via INTRAVENOUS
  Filled 2020-11-17: qty 510

## 2020-11-17 MED ORDER — LORATADINE 10 MG PO TABS
10.0000 mg | ORAL_TABLET | Freq: Once | ORAL | Status: AC
  Administered 2020-11-17: 10 mg via ORAL
  Filled 2020-11-17: qty 1

## 2020-11-17 NOTE — Patient Instructions (Signed)
[place]  Discharge Instructions: Thank you for choosing [place] to provide your oncology and hematology care.  If you have a lab appointment with the [facility], please come in thru the Main Entrance and check in at the main information desk.  Wear comfortable clothing and clothing appropriate for easy access to any Portacath or PICC line.   We strive to give you quality time with your provider. You may need to reschedule your appointment if you arrive late (15 or more minutes).  Arriving late affects you and other patients whose appointments are after yours.  Also, if you miss three or more appointments without notifying the office, you may be dismissed from the clinic at the provider's discretion.      For prescription refill requests, have your pharmacy contact our office and allow 72 hours for refills to be completed.    Today you received the following Feraheme IV iron infusion.    BELOW ARE SYMPTOMS THAT SHOULD BE REPORTED IMMEDIATELY: *FEVER GREATER THAN 100.4 F (38 C) OR HIGHER *CHILLS OR SWEATING *NAUSEA AND VOMITING THAT IS NOT CONTROLLED WITH YOUR NAUSEA MEDICATION *UNUSUAL SHORTNESS OF BREATH *UNUSUAL BRUISING OR BLEEDING *URINARY PROBLEMS (pain or burning when urinating, or frequent urination) *BOWEL PROBLEMS (unusual diarrhea, constipation, pain near the anus) TENDERNESS IN MOUTH AND THROAT WITH OR WITHOUT PRESENCE OF ULCERS (sore throat, sores in mouth, or a toothache) UNUSUAL RASH, SWELLING OR PAIN  UNUSUAL VAGINAL DISCHARGE OR ITCHING   Items with * indicate a potential emergency and should be followed up as soon as possible or go to the Emergency Department if any problems should occur.  Please show the CHEMOTHERAPY ALERT CARD or IMMUNOTHERAPY ALERT CARD at check-in to the Emergency Department and triage nurse.  Should you have questions after your visit or need to cancel or reschedule your appointment, please contact [facility] [phone]  and follow the prompts.  Office hours are 8:00 a.m. to 4:30 p.m. Monday - Friday. Please note that voicemails left after 4:00 p.m. may not be returned until the following business day.  We are closed weekends and major holidays. You have access to a nurse at all times for urgent questions. Please call the main number to the clinic [phone] and follow the prompts.  For any non-urgent questions, you may also contact your provider using MyChart. We now offer e-Visits for anyone 64 and older to request care online for non-urgent symptoms. For details visit [email or web address].   Also download the MyChart app! Go to the app store, search "MyChart", open the app, select [facility], and log in with your MyChart username and password.  Due to Covid, a mask is required upon entering the hospital/clinic. If you do not have a mask, one will be given to you upon arrival. For doctor visits, patients may have 1 support person aged 13 or older with them. For treatment visits, patients cannot have anyone with them due to current Covid guidelines and our immunocompromised population.

## 2020-11-17 NOTE — Progress Notes (Signed)
Pt presents today for Feraheme per provider's order. Vital signs stable and pt voiced no new complaints at this time.   Peripheral IV started with good blood return pre and post infusion.  Feraheme given today per MD orders. Tolerated infusion without adverse affects. Vital signs stable. No complaints at this time. Discharged from clinic ambulatory in stable condition. Alert and oriented x 3. F/U with [facility] as scheduled.

## 2020-11-20 ENCOUNTER — Other Ambulatory Visit: Payer: Self-pay

## 2020-11-20 ENCOUNTER — Ambulatory Visit (HOSPITAL_COMMUNITY): Payer: Medicare Other | Attending: Adult Health

## 2020-11-20 DIAGNOSIS — I422 Other hypertrophic cardiomyopathy: Secondary | ICD-10-CM

## 2020-11-20 LAB — ECHOCARDIOGRAM COMPLETE
AV Mean grad: 14.2 mmHg
AV Peak grad: 26.9 mmHg
Ao pk vel: 2.59 m/s
Area-P 1/2: 2.34 cm2
S' Lateral: 1.3 cm

## 2020-11-20 MED ORDER — PERFLUTREN LIPID MICROSPHERE
1.0000 mL | INTRAVENOUS | Status: AC | PRN
Start: 2020-11-20 — End: 2020-11-20
  Administered 2020-11-20: 3 mL via INTRAVENOUS

## 2020-11-24 ENCOUNTER — Telehealth: Payer: Self-pay

## 2020-11-24 NOTE — Telephone Encounter (Addendum)
Spoke with patient regarding echocardiogram results. Advised referral sent to [facility].----- Message from Lendon Colonel, NP sent at 11/23/2020  2:24 PM EDT ----- I have reviewed her echo report comparison from 07/2019.  She has been referred to Advanced Heart Failure clinic for recommendations.  No changes in her regimen for now.   Curt Bears

## 2020-11-29 ENCOUNTER — Inpatient Hospital Stay (HOSPITAL_COMMUNITY): Payer: Medicare Other | Attending: Hematology

## 2020-11-29 ENCOUNTER — Other Ambulatory Visit: Payer: Self-pay

## 2020-11-29 DIAGNOSIS — D509 Iron deficiency anemia, unspecified: Secondary | ICD-10-CM | POA: Diagnosis not present

## 2020-11-29 DIAGNOSIS — D500 Iron deficiency anemia secondary to blood loss (chronic): Secondary | ICD-10-CM

## 2020-11-29 LAB — CBC WITH DIFFERENTIAL/PLATELET
Abs Immature Granulocytes: 0.02 10*3/uL (ref 0.00–0.07)
Basophils Absolute: 0.1 10*3/uL (ref 0.0–0.1)
Basophils Relative: 1 %
Eosinophils Absolute: 0.2 10*3/uL (ref 0.0–0.5)
Eosinophils Relative: 2 %
HCT: 34.8 % — ABNORMAL LOW (ref 36.0–46.0)
Hemoglobin: 11.2 g/dL — ABNORMAL LOW (ref 12.0–15.0)
Immature Granulocytes: 0 %
Lymphocytes Relative: 23 %
Lymphs Abs: 1.7 10*3/uL (ref 0.7–4.0)
MCH: 30.2 pg (ref 26.0–34.0)
MCHC: 32.2 g/dL (ref 30.0–36.0)
MCV: 93.8 fL (ref 80.0–100.0)
Monocytes Absolute: 0.8 10*3/uL (ref 0.1–1.0)
Monocytes Relative: 11 %
Neutro Abs: 4.8 10*3/uL (ref 1.7–7.7)
Neutrophils Relative %: 63 %
Platelets: 254 10*3/uL (ref 150–400)
RBC: 3.71 MIL/uL — ABNORMAL LOW (ref 3.87–5.11)
RDW: 14.6 % (ref 11.5–15.5)
WBC: 7.6 10*3/uL (ref 4.0–10.5)
nRBC: 0 % (ref 0.0–0.2)

## 2020-11-29 LAB — SAMPLE TO BLOOD BANK

## 2020-12-04 ENCOUNTER — Emergency Department (HOSPITAL_COMMUNITY): Payer: Medicare Other

## 2020-12-04 ENCOUNTER — Telehealth (HOSPITAL_COMMUNITY): Payer: Self-pay | Admitting: *Deleted

## 2020-12-04 ENCOUNTER — Telehealth: Payer: Self-pay | Admitting: Internal Medicine

## 2020-12-04 ENCOUNTER — Other Ambulatory Visit: Payer: Self-pay

## 2020-12-04 ENCOUNTER — Inpatient Hospital Stay (HOSPITAL_COMMUNITY)
Admission: EM | Admit: 2020-12-04 | Discharge: 2020-12-07 | DRG: 356 | Disposition: A | Discharge status: Home / Self Care | Payer: Medicare Other | Attending: Family Medicine | Admitting: Family Medicine

## 2020-12-04 ENCOUNTER — Encounter (HOSPITAL_COMMUNITY): Payer: Self-pay | Admitting: Emergency Medicine

## 2020-12-04 DIAGNOSIS — Z743 Need for continuous supervision: Secondary | ICD-10-CM | POA: Diagnosis not present

## 2020-12-04 DIAGNOSIS — I422 Other hypertrophic cardiomyopathy: Secondary | ICD-10-CM | POA: Diagnosis not present

## 2020-12-04 DIAGNOSIS — Z20822 Contact with and (suspected) exposure to covid-19: Secondary | ICD-10-CM | POA: Diagnosis not present

## 2020-12-04 DIAGNOSIS — R0602 Shortness of breath: Secondary | ICD-10-CM | POA: Diagnosis not present

## 2020-12-04 DIAGNOSIS — E785 Hyperlipidemia, unspecified: Secondary | ICD-10-CM | POA: Diagnosis not present

## 2020-12-04 DIAGNOSIS — D649 Anemia, unspecified: Secondary | ICD-10-CM | POA: Diagnosis not present

## 2020-12-04 DIAGNOSIS — K3182 Dieulafoy lesion (hemorrhagic) of stomach and duodenum: Secondary | ICD-10-CM | POA: Diagnosis not present

## 2020-12-04 DIAGNOSIS — K317 Polyp of stomach and duodenum: Secondary | ICD-10-CM | POA: Diagnosis not present

## 2020-12-04 DIAGNOSIS — R531 Weakness: Secondary | ICD-10-CM | POA: Diagnosis not present

## 2020-12-04 DIAGNOSIS — I700 Atherosclerosis of aorta: Secondary | ICD-10-CM | POA: Diagnosis not present

## 2020-12-04 DIAGNOSIS — Z79899 Other long term (current) drug therapy: Secondary | ICD-10-CM | POA: Diagnosis not present

## 2020-12-04 DIAGNOSIS — Z882 Allergy status to sulfonamides status: Secondary | ICD-10-CM | POA: Diagnosis not present

## 2020-12-04 DIAGNOSIS — Z803 Family history of malignant neoplasm of breast: Secondary | ICD-10-CM | POA: Diagnosis not present

## 2020-12-04 DIAGNOSIS — D62 Acute posthemorrhagic anemia: Secondary | ICD-10-CM | POA: Diagnosis not present

## 2020-12-04 DIAGNOSIS — I517 Cardiomegaly: Secondary | ICD-10-CM | POA: Diagnosis not present

## 2020-12-04 DIAGNOSIS — K31811 Angiodysplasia of stomach and duodenum with bleeding: Secondary | ICD-10-CM | POA: Diagnosis not present

## 2020-12-04 DIAGNOSIS — K922 Gastrointestinal hemorrhage, unspecified: Secondary | ICD-10-CM

## 2020-12-04 DIAGNOSIS — Z800 Family history of malignant neoplasm of digestive organs: Secondary | ICD-10-CM | POA: Diagnosis not present

## 2020-12-04 DIAGNOSIS — K921 Melena: Secondary | ICD-10-CM | POA: Diagnosis not present

## 2020-12-04 DIAGNOSIS — K449 Diaphragmatic hernia without obstruction or gangrene: Secondary | ICD-10-CM | POA: Diagnosis not present

## 2020-12-04 DIAGNOSIS — I248 Other forms of acute ischemic heart disease: Secondary | ICD-10-CM | POA: Diagnosis not present

## 2020-12-04 DIAGNOSIS — I340 Nonrheumatic mitral (valve) insufficiency: Secondary | ICD-10-CM | POA: Diagnosis not present

## 2020-12-04 DIAGNOSIS — I2510 Atherosclerotic heart disease of native coronary artery without angina pectoris: Secondary | ICD-10-CM | POA: Diagnosis not present

## 2020-12-04 DIAGNOSIS — Z87891 Personal history of nicotine dependence: Secondary | ICD-10-CM

## 2020-12-04 DIAGNOSIS — I472 Ventricular tachycardia: Secondary | ICD-10-CM | POA: Diagnosis not present

## 2020-12-04 DIAGNOSIS — K5731 Diverticulosis of large intestine without perforation or abscess with bleeding: Secondary | ICD-10-CM | POA: Diagnosis present

## 2020-12-04 DIAGNOSIS — R58 Hemorrhage, not elsewhere classified: Secondary | ICD-10-CM | POA: Diagnosis not present

## 2020-12-04 DIAGNOSIS — K219 Gastro-esophageal reflux disease without esophagitis: Secondary | ICD-10-CM | POA: Diagnosis present

## 2020-12-04 DIAGNOSIS — E669 Obesity, unspecified: Secondary | ICD-10-CM | POA: Diagnosis not present

## 2020-12-04 DIAGNOSIS — I10 Essential (primary) hypertension: Secondary | ICD-10-CM | POA: Diagnosis not present

## 2020-12-04 DIAGNOSIS — K5900 Constipation, unspecified: Secondary | ICD-10-CM | POA: Diagnosis not present

## 2020-12-04 DIAGNOSIS — Z9889 Other specified postprocedural states: Secondary | ICD-10-CM | POA: Diagnosis not present

## 2020-12-04 DIAGNOSIS — Z8719 Personal history of other diseases of the digestive system: Secondary | ICD-10-CM | POA: Diagnosis not present

## 2020-12-04 DIAGNOSIS — I724 Aneurysm of artery of lower extremity: Secondary | ICD-10-CM | POA: Diagnosis not present

## 2020-12-04 LAB — POC OCCULT BLOOD, ED: Fecal Occult Bld: POSITIVE — AB

## 2020-12-04 LAB — TROPONIN I (HIGH SENSITIVITY)
Troponin I (High Sensitivity): 105 ng/L (ref <18)
Troponin I (High Sensitivity): 119 ng/L (ref <18)

## 2020-12-04 LAB — COMPREHENSIVE METABOLIC PANEL
ALT: 18 U/L (ref 0–44)
AST: 24 U/L (ref 15–41)
Albumin: 3.6 g/dL (ref 3.5–5.0)
Alkaline Phosphatase: 25 U/L — ABNORMAL LOW (ref 38–126)
Anion gap: 6 (ref 5–15)
BUN: 27 mg/dL — ABNORMAL HIGH (ref 8–23)
CO2: 24 mmol/L (ref 22–32)
Calcium: 8.6 mg/dL — ABNORMAL LOW (ref 8.9–10.3)
Chloride: 107 mmol/L (ref 98–111)
Creatinine, Ser: 0.63 mg/dL (ref 0.44–1.00)
GFR, Estimated: >60 mL/min (ref >60)
Glucose, Bld: 122 mg/dL — ABNORMAL HIGH (ref 70–99)
Potassium: 3.6 mmol/L (ref 3.5–5.1)
Sodium: 137 mmol/L (ref 135–145)
Total Bilirubin: 0.5 mg/dL (ref 0.3–1.2)
Total Protein: 5.5 g/dL — ABNORMAL LOW (ref 6.5–8.1)

## 2020-12-04 LAB — CBC
HCT: 24.3 % — ABNORMAL LOW (ref 36.0–46.0)
Hemoglobin: 7.6 g/dL — ABNORMAL LOW (ref 12.0–15.0)
MCH: 31 pg (ref 26.0–34.0)
MCHC: 31.3 g/dL (ref 30.0–36.0)
MCV: 99.2 fL (ref 80.0–100.0)
Platelets: 205 10*3/uL (ref 150–400)
RBC: 2.45 MIL/uL — ABNORMAL LOW (ref 3.87–5.11)
RDW: 15.1 % (ref 11.5–15.5)
WBC: 9 10*3/uL (ref 4.0–10.5)
nRBC: 0 % (ref 0.0–0.2)

## 2020-12-04 LAB — PREPARE RBC (CROSSMATCH)

## 2020-12-04 MED ORDER — SODIUM CHLORIDE 0.9% FLUSH
3.0000 mL | Freq: Two times a day (BID) | INTRAVENOUS | Status: DC
  Administered 2020-12-04 – 2020-12-07 (×6): 3 mL via INTRAVENOUS

## 2020-12-04 MED ORDER — SODIUM CHLORIDE 0.9 % IV SOLN: 250.0000 mL | INTRAVENOUS | Status: DC | PRN

## 2020-12-04 MED ORDER — ACETAMINOPHEN 325 MG PO TABS
650.0000 mg | ORAL_TABLET | Freq: Four times a day (QID) | ORAL | Status: DC | PRN
  Administered 2020-12-05 – 2020-12-07 (×4): 650 mg via ORAL
  Filled 2020-12-04 (×4): qty 2

## 2020-12-04 MED ORDER — SODIUM CHLORIDE 0.9 % IV SOLN
10.0000 mL/h | Freq: Once | INTRAVENOUS | Status: AC
  Administered 2020-12-04: 10 mL/h via INTRAVENOUS

## 2020-12-04 MED ORDER — ACETAMINOPHEN 650 MG RE SUPP: 650.0000 mg | Freq: Four times a day (QID) | RECTAL | Status: DC | PRN

## 2020-12-04 MED ORDER — ONDANSETRON HCL 4 MG/2ML IJ SOLN: 4.0000 mg | Freq: Four times a day (QID) | INTRAMUSCULAR | Status: DC | PRN

## 2020-12-04 MED ORDER — PANTOPRAZOLE SODIUM 40 MG IV SOLR
40.0000 mg | Freq: Two times a day (BID) | INTRAVENOUS | Status: DC
  Administered 2020-12-04 – 2020-12-07 (×7): 40 mg via INTRAVENOUS
  Filled 2020-12-04 (×7): qty 40

## 2020-12-04 MED ORDER — SODIUM CHLORIDE 0.9% FLUSH: 3.0000 mL | INTRAVENOUS | Status: DC | PRN

## 2020-12-04 MED ORDER — ONDANSETRON HCL 4 MG PO TABS: 4.0000 mg | ORAL_TABLET | Freq: Four times a day (QID) | ORAL | Status: DC | PRN

## 2020-12-04 MED ORDER — BISOPROLOL FUMARATE 5 MG PO TABS
2.5000 mg | ORAL_TABLET | Freq: Every day | ORAL | Status: DC
  Administered 2020-12-06 – 2020-12-07 (×2): 2.5 mg via ORAL
  Filled 2020-12-04 (×3): qty 1

## 2020-12-04 NOTE — Procedures (Signed)
Small Bowel [place] Capsule Study Procedure date:  10/22/20  Referring Provider:  Dr. Abbey Chatters PCP:  Dr. Lajuana Ripple, Koleen Distance, DO  Indication for procedure:   Acute on chronic anemia, heme positive stool, negative EGD.     Findings:   Capsule complete to the cecum. Duodenitis seen without acute bleeding. No obvious AVMs, lesions, mass appreciated. Scattered lymphangiectasias noted. Possible old dark blood in colon but difficult to appreciate in light of stool.   First Gastric image:  00:00:27 First Duodenal image: 00:29:18 First Cecal image: 04:33:01 Gastric Passage time: 0h 59mSmall Bowel Passage time:  4h 389mSummary & Recommendations: [age]ear old female with history of IDA without obvious active bleeding source on capsule study. Previous history of AVMs seen on capsules. Will need to continue close follow-up with Hematology for iron infusions and serial monitoring of H/H. May benefit from octreotide therapy monthly as outpatient if continued transfusion dependent anemia.    AnAnnitta NeedsPhD, ANP-BC [facility]

## 2020-12-04 NOTE — Telephone Encounter (Signed)
PATIENT CALLED BACK STATED THE PHONE ONLY RANG TWICE.  PLEASE CALL BACK

## 2020-12-04 NOTE — Telephone Encounter (Signed)
Agree 

## 2020-12-04 NOTE — Telephone Encounter (Signed)
Patient called stating that she is now having black tarry stools and is extremely weak.  Had iron infusion on August 26, th.  Advised her to contact Dr Ave Filter office or go to the emergency room to be evaluated.  Verbalized understanding, however is reluctant to use the emergency room.

## 2020-12-04 NOTE — Telephone Encounter (Signed)
Discussed options with patient and urged her to go to the ER for work up and this was also suggested by Dr Ave Filter office.  Made her aware that we can check her labs and treat, however this will not fix the entire issue.  Verbalized understanding.  Will follow up with her later today.

## 2020-12-04 NOTE — Telephone Encounter (Signed)
See other phone note

## 2020-12-04 NOTE — Telephone Encounter (Signed)
noted 

## 2020-12-04 NOTE — H&P (Addendum)
History and Physical    Stacey Reyes [id number] DOB: [birth year] DOA: 12/04/2020  PCP: [name], DO   Patient coming from: Home  Chief Complaint: Dark stools  HPI: Stacey Reyes is a 74 y.o. female with medical history significant for hypertension, hyperlipidemia, GERD, small bowel AVMs, and chronic anemia requiring iron transfusions who presented to the ED with complaints of dark stools over the last 5 days.  She has had some shortness of breath over the last 1 week that was particularly worsened with exertion, but she denies any chest pain or pressure.  She has had some lightheadedness and dizziness over the last 1 day.  She denies any nausea, vomiting, diarrhea, fevers, or chills.  She was recently discharged on 10/22/2020 for the same and had EGD at that time that was unrevealing and underwent capsule endoscopy revealing AVMs.   ED Course: Vital signs stable, but with some soft blood pressure readings noted.  Hemoglobin 7.6, chest x-ray with no acute findings and EKG with normal sinus rhythm and some mild LVH but no other significant abnormalities.  Troponin noted to be 105.  2 units PRBCs ordered by EDP.  GI planning to consult.  FOBT is positive.  Review of Systems: Reviewed as noted above, otherwise negative.  Past Medical History:  Diagnosis Date   Anemia    Cataract    removed bilateral   Chronic cystitis    Diverticulitis    Heart murmur    Hyperlipidemia    Hypertension    Personal history of colonic polyps-adenomas 01/07/2012   2009 - 2 diminutive adenomas (prior polyps also) 01/07/2012 - 2 diminutive adenomas      Past Surgical History:  Procedure Laterality Date   BREAST BIOPSY Right    No Scar seen    COLONOSCOPY  multiple   ESOPHAGOGASTRODUODENOSCOPY (EGD) WITH PROPOFOL N/A 11/23/2019   Procedure: ESOPHAGOGASTRODUODENOSCOPY (EGD) WITH PROPOFOL;  Surgeon: Daneil Dolin, MD;  [facility];  Service: Endoscopy;  Laterality: N/A;    ESOPHAGOGASTRODUODENOSCOPY (EGD) WITH PROPOFOL N/A 10/20/2020   Procedure: ESOPHAGOGASTRODUODENOSCOPY (EGD) WITH PROPOFOL;  Surgeon: Eloise Harman, DO;  Location: [facility];  Service: Endoscopy;  Laterality: N/A;   GIVENS CAPSULE STUDY N/A 11/23/2019   Procedure: GIVENS CAPSULE STUDY;  Surgeon: Daneil Dolin, MD;  Location: [facility];  Service: Endoscopy;  Laterality: N/A;   GIVENS CAPSULE STUDY N/A 10/22/2020   Procedure: GIVENS CAPSULE STUDY;  Surgeon: Eloise Harman, DO;  Location: [facility];  Service: Endoscopy;  Laterality: N/A;     reports that she quit smoking about 35 years ago. Her smoking use included cigarettes. She has never used smokeless tobacco. She reports current alcohol use. She reports that she does not use drugs.  Allergies  Allergen Reactions   Sulfa Antibiotics Rash    Family History  Problem Relation Age of Onset   Colon cancer Mother 76       80's   Breast cancer Sister    Asthma Brother    Colon polyps Neg Hx    Esophageal cancer Neg Hx    Rectal cancer Neg Hx    Stomach cancer Neg Hx     Prior to Admission medications   Medication Sig Start Date End Date Taking? Authorizing Provider  Ascorbic Acid (VITAMIN C) 1000 MG tablet Take 1,000 mg by mouth daily.   Yes [provider]  bisoprolol (ZEBETA) 5 MG tablet Take 0.5 tablets (2.5 mg total) by mouth daily.  10/27/20  Yes Lendon Colonel, NP  clobetasol ointment (TEMOVATE) [id number] % Apply 1 application topically 2 (two) times daily as needed (skin). 10/26/20  Yes [provider]  Copper Gluconate (COPPER CAPS PO) Take 1 capsule by mouth daily.    Yes [provider]  cyanocobalamin ([employer] VITAMIN B12) 2000 MCG tablet Take 1 tablet (2,000 mcg total) by mouth daily. 03/25/19  Yes Johnson, Clanford L, MD  Magnesium 200 MG TABS Take 1 tablet (200 mg total) by mouth daily. 03/23/19  Yes Lendon Colonel, NP  Multiple Minerals-Vitamins (CALCIUM-MAGNESIUM-ZINC-D3 PO) Take 1  tablet by mouth daily.   Yes [provider]  Multiple Vitamin (MULTIVITAMIN) capsule Take 1 capsule by mouth daily.    Yes [provider]  pantoprazole (PROTONIX) 40 MG tablet TAKE 1 TABLET BY MOUTH  DAILY Patient taking differently: Take 40 mg by mouth daily. 11/07/20  Yes Gottschalk, Ashly M, DO  rosuvastatin (CRESTOR) 10 MG tablet TAKE 1 TABLET BY MOUTH AT  BEDTIME Patient taking differently: Take 10 mg by mouth daily. 04/12/20  Yes Ronnie Doss M, DO  vitamin E 400 UNIT capsule Take 400 Units by mouth daily.    Yes [provider]    Physical Exam: Vitals:   12/04/20 1209 12/04/20 1210 12/04/20 1300  BP: 100/88 (!) 98/33 (!) 98/54  Pulse: 83 84 75  Resp: 18  15  Temp: 98.1 F (36.7 C)    TempSrc: Oral    SpO2: 100%  100%  Weight: 90.7 kg    Height: '5\' 5"'$  (1.651 m)      Constitutional: NAD, calm, comfortable Vitals:   12/04/20 1209 12/04/20 1210 12/04/20 1300  BP: 100/88 (!) 98/33 (!) 98/54  Pulse: 83 84 75  Resp: 18  15  Temp: 98.1 F (36.7 C)    TempSrc: Oral    SpO2: 100%  100%  Weight: 90.7 kg    Height: '5\' 5"'$  (1.651 m)     Eyes: lids and conjunctivae normal Neck: normal, supple Respiratory: clear to auscultation bilaterally. Normal respiratory effort. No accessory muscle use.  Cardiovascular: Regular rate and rhythm, no murmurs. Abdomen: no tenderness, no distention. Bowel sounds positive.  Musculoskeletal:  No edema. Skin: no rashes, lesions, ulcers.  Psychiatric: Flat affect  Labs on Admission: I have personally reviewed following labs and imaging studies  CBC: Recent Labs  Lab 11/29/20 1251 12/04/20 1223  WBC 7.6 9.0  NEUTROABS 4.8  --   HGB 11.2* 7.6*  HCT 34.8* 24.3*  MCV 93.8 99.2  PLT 254 [id number]   Basic Metabolic Panel: Recent Labs  Lab 12/04/20 1223  NA 137  K 3.6  CL 107  CO2 24  GLUCOSE 122*  BUN 27*  CREATININE 0.63  CALCIUM 8.6*   GFR: Estimated Creatinine Clearance: 67.6 mL/min (by C-G formula  based on SCr of 0.63 mg/dL). Liver Function Tests: Recent Labs  Lab 12/04/20 1223  AST 24  ALT 18  ALKPHOS 25*  BILITOT 0.5  PROT 5.5*  ALBUMIN 3.6   No results for input(s): LIPASE, AMYLASE in the last 168 hours. No results for input(s): AMMONIA in the last 168 hours. Coagulation Profile: No results for input(s): INR, PROTIME in the last 168 hours. Cardiac Enzymes: No results for input(s): CKTOTAL, CKMB, CKMBINDEX, TROPONINI in the last 168 hours. BNP (last 3 results) Recent Labs    10/19/20 1244 10/25/20 1112  PROBNP 2,974* 1,440*   HbA1C: No results for input(s): HGBA1C in the last 72 hours. CBG: No  results for input(s): GLUCAP in the last 168 hours. Lipid Profile: No results for input(s): CHOL, HDL, LDLCALC, TRIG, CHOLHDL, LDLDIRECT in the last 72 hours. Thyroid Function Tests: No results for input(s): TSH, T4TOTAL, FREET4, T3FREE, THYROIDAB in the last 72 hours. Anemia Panel: No results for input(s): VITAMINB12, FOLATE, FERRITIN, TIBC, IRON, RETICCTPCT in the last 72 hours. Urine analysis:    Component Value Date/Time   COLORURINE YELLOW 11/14/2019 0851   APPEARANCEUR Clear 10/19/2020 1215   LABSPEC 1.015 11/14/2019 0851   PHURINE 8.0 11/14/2019 0851   GLUCOSEU Negative 10/19/2020 1215   HGBUR NEGATIVE 11/14/2019 0851   BILIRUBINUR Negative 10/19/2020 1215   KETONESUR NEGATIVE 11/14/2019 0851   PROTEINUR Negative 10/19/2020 1215   PROTEINUR NEGATIVE 11/14/2019 0851   UROBILINOGEN negative 02/24/2015 1344   NITRITE Negative 10/19/2020 1215   NITRITE NEGATIVE 11/14/2019 0851   LEUKOCYTESUR Negative 10/19/2020 1215   LEUKOCYTESUR TRACE (A) 11/14/2019 0851    Radiological Exams on Admission: DG Chest Port 1 View  Result Date: 12/04/2020 CLINICAL DATA:  Shortness of breath EXAM: PORTABLE CHEST 1 VIEW COMPARISON:  Chest x-ray dated October 19, 2020 FINDINGS: Unchanged cardiomegaly. Mediastinal contours are within normal limits. Clear lungs. No pleural effusion or  pneumothorax. IMPRESSION: No active disease. Electronically Signed   By: Yetta Glassman M.D.   On: 12/04/2020 14:02    EKG: Independently reviewed. NSR 76bpm with LVH.  Assessment/Plan Active Problems:   Acute blood loss anemia    Acute on chronic blood loss anemia -Plan for 2 unit PRBC transfusion -PPI twice daily -CLD then keep n.p.o. after MN -History of small bowel AVMs -GI to evaluate for endoscopy  Elevated troponin-in the setting of GI bleed -Chest pain-free and doubtful this is ACS -Continue to monitor  Hypertension/Dyslipidemia -Continue bisoprolol and hold Crestor for now  Hypertrophic cardiomyopathy -Continue bisoprolol -Follow-up with cardiology outpatient   DVT prophylaxis: SCDs Code Status: Full Family Communication: None at bedside Disposition Plan:Admit for GI bleed evaluation Consults called:GI Admission status: Inpatient, Tele  Cheyan Frees D Miriam Kestler DO [facility]  If 7PM-7AM, please contact night-coverage [email or web address]  12/04/2020, 2:14 PM

## 2020-12-04 NOTE — ED Triage Notes (Signed)
Pt c/o blood in stool that started last Thursday. Pt also c/o shortness of breath that started 1 week ago.

## 2020-12-04 NOTE — Telephone Encounter (Signed)
Returned the pt's call and all circuits are busy

## 2020-12-04 NOTE — Telephone Encounter (Signed)
Pt called this morning saying that Dr Abbey Chatters had done a procedure on her and was to follow up with him ASAP. Please advise when Dr Abbey Chatters would like to see her since he is scheduled so far out. [phone]

## 2020-12-04 NOTE — Telephone Encounter (Signed)
Noted  

## 2020-12-04 NOTE — Telephone Encounter (Signed)
I agree.  She is very high risk for recurrent GI bleeding from multiple AVMs.  Due to the level of her weakness, she should go to the ED for likely acute GI bleed.  If she absolutely refuses to go to ED, please have her get a CBC with sample to blood bank here ASAP - I suspect she may need transfusion.

## 2020-12-04 NOTE — Telephone Encounter (Signed)
Pt phoned this morning regarding her stools being jet black since Friday night. Pt complains of fatigue, dizziness, muscle aches and SOB. Her SOB is more just trying to go from room to room. No n/v, headache. States she is staying sleep a lot. The pt has only had communication with [place] , not been seen in the office. I advised the pt to go straight to the ED to be evaluated and she is on her way to [facility]. No diarrhea or constipation noted

## 2020-12-04 NOTE — Consult Note (Signed)
Referring Provider: [facility] Primary Care Physician:  [name], DO Primary Gastroenterologist:  [facility]  Date of Admission: 12/04/20 Date of Consultation: 12/04/20  Reason for Consultation:  Acute GI bleed  HPI:  Stacey Reyes is a pleasant 75 y.o. year old female with IDA likely secondary to AVMs and extensive GI evaluation since August 2020 as listed below, requiring blood transfusions and followed by Hematology with IV iron, presenting again this admission with acute on chronic symptomatic anemia, heme positive stool, and melena. Most recently admitted in July 2022 undergoing EGD that was unrevealing. Capsule study also completed July 2022 without any obvious source of bleeding. Prior GI evaluation as noted below.   Reports onset of dark stool Friday, associated weakness, fatigue, and shortness of breath. Notes she had mild discomfort lower abdomen and back pain, which she states happens with every GI bleed. On admission, Hgb was 7.6, down from 11.2 five days ago. Elevated troponins likely due to demand ischemia. Last Feraheme infusion 8/26. Presented to the ED due to weakness and black stool. 2 units PRBCs have been ordered, with the first unit started. She denies any NSAIDs. Taking PPI daily. Mild nausea several days ago. No vomiting. No dysphagia.      Prior GI evaluation: EGD August 2020: Duodenal erosions biopsied (benign), otherwise normal exam.  Colonoscopy August 2020: 2 polyps removed (tubular adenomas), diverticulosis in the sigmoid, descending, transverse colon, otherwise normal exam. EGD August 2021: Normal esophagus, small hiatal hernia, innocent appearing duodenal erosions. Small bowel capsule August 2021: Benign duodenal erosions without active bleeding, large amounts of old blood in cecum with no obvious source. Colonoscopy November 2021: 7 polyps removed (tubular adenomas), diverticulosis in sigmoid, descending, transverse, and descending colon,  erythema and petechia in association with diverticulosis in the left colon biopsies (benign), external and internal hemorrhoids. Givens capsule November 2021: Duodenitis, 1 small AVM in proximal small bowel about 8 minutes from first duodenal image, 1 tiny AVM at 1 hour 19 minutes, numerous tiny lesions versus luminal contents of unclear significance and distal small bowel. EGD July 2022: normal duodenal bulb, first portion of duodenum and second portion of duodenum.  Capsule study July 2022: duodenitis. No obvious AVMs appreciated. Complete to the cecum. Possible old blood in colon but difficult to visualize due to debris.   Past Medical History:  Diagnosis Date   Anemia    Cataract    removed bilateral   Chronic cystitis    Diverticulitis    Heart murmur    Hyperlipidemia    Hypertension    Personal history of colonic polyps-adenomas 01/07/2012   2009 - 2 diminutive adenomas (prior polyps also) 01/07/2012 - 2 diminutive adenomas      Past Surgical History:  Procedure Laterality Date   BREAST BIOPSY Right    No Scar seen    COLONOSCOPY  multiple   ESOPHAGOGASTRODUODENOSCOPY (EGD) WITH PROPOFOL N/A 11/23/2019   Procedure: ESOPHAGOGASTRODUODENOSCOPY (EGD) WITH PROPOFOL;  Surgeon: Daneil Dolin, MD;  [facility];  Service: Endoscopy;  Laterality: N/A;   ESOPHAGOGASTRODUODENOSCOPY (EGD) WITH PROPOFOL N/A 10/20/2020   Procedure: ESOPHAGOGASTRODUODENOSCOPY (EGD) WITH PROPOFOL;  Surgeon: Eloise Harman, DO;  [facility];  Service: Endoscopy;  Laterality: N/A;   GIVENS CAPSULE STUDY N/A 11/23/2019   Procedure: GIVENS CAPSULE STUDY;  Surgeon: Daneil Dolin, MD;  [facility];  Service: Endoscopy;  Laterality: N/A;   GIVENS CAPSULE STUDY N/A 10/22/2020   Procedure: GIVENS CAPSULE STUDY;  Surgeon:  Eloise Harman, DO;  [facility];  Service: Endoscopy;  Laterality: N/A;    Prior to Admission medications   Medication Sig Start Date End Date  Taking? Authorizing Provider  Ascorbic Acid (VITAMIN C) 1000 MG tablet Take 1,000 mg by mouth daily.   Yes [provider]  bisoprolol (ZEBETA) 5 MG tablet Take 0.5 tablets (2.5 mg total) by mouth daily. 10/27/20  Yes Lendon Colonel, NP  clobetasol ointment (TEMOVATE) AB-123456789 % Apply 1 application topically 2 (two) times daily as needed (skin). 10/26/20  Yes [provider]  Copper Gluconate (COPPER CAPS PO) Take 1 capsule by mouth daily.    Yes [provider]  cyanocobalamin ([employer] VITAMIN B12) 2000 MCG tablet Take 1 tablet (2,000 mcg total) by mouth daily. 03/25/19  Yes Johnson, Clanford L, MD  Magnesium 200 MG TABS Take 1 tablet (200 mg total) by mouth daily. 03/23/19  Yes Lendon Colonel, NP  Multiple Minerals-Vitamins (CALCIUM-MAGNESIUM-ZINC-D3 PO) Take 1 tablet by mouth daily.   Yes [provider]  Multiple Vitamin (MULTIVITAMIN) capsule Take 1 capsule by mouth daily.    Yes [provider]  pantoprazole (PROTONIX) 40 MG tablet TAKE 1 TABLET BY MOUTH  DAILY Patient taking differently: Take 40 mg by mouth daily. 11/07/20  Yes Gottschalk, Ashly M, DO  rosuvastatin (CRESTOR) 10 MG tablet TAKE 1 TABLET BY MOUTH AT  BEDTIME Patient taking differently: Take 10 mg by mouth daily. 04/12/20  Yes Ronnie Doss M, DO  vitamin E 400 UNIT capsule Take 400 Units by mouth daily.    Yes [provider]    Current Facility-Administered Medications  Medication Dose Route Frequency Provider Last Rate Last Admin   0.9 %  sodium chloride infusion  10 mL/hr Intravenous Once Evalee Jefferson, PA-C       Current Outpatient Medications  Medication Sig Dispense Refill   Ascorbic Acid (VITAMIN C) 1000 MG tablet Take 1,000 mg by mouth daily.     bisoprolol (ZEBETA) 5 MG tablet Take 0.5 tablets (2.5 mg total) by mouth daily. 45 tablet 3   clobetasol ointment (TEMOVATE) AB-123456789 % Apply 1 application topically 2 (two) times daily as needed (skin).     Copper  Gluconate (COPPER CAPS PO) Take 1 capsule by mouth daily.      cyanocobalamin (CVS VITAMIN B12) 2000 MCG tablet Take 1 tablet (2,000 mcg total) by mouth daily.     Magnesium 200 MG TABS Take 1 tablet (200 mg total) by mouth daily. 30 tablet 6   Multiple Minerals-Vitamins (CALCIUM-MAGNESIUM-ZINC-D3 PO) Take 1 tablet by mouth daily.     Multiple Vitamin (MULTIVITAMIN) capsule Take 1 capsule by mouth daily.      pantoprazole (PROTONIX) 40 MG tablet TAKE 1 TABLET BY MOUTH  DAILY (Patient taking differently: Take 40 mg by mouth daily.) 90 tablet 1   rosuvastatin (CRESTOR) 10 MG tablet TAKE 1 TABLET BY MOUTH AT  BEDTIME (Patient taking differently: Take 10 mg by mouth daily.) 90 tablet 3   vitamin E 400 UNIT capsule Take 400 Units by mouth daily.       Allergies as of 12/04/2020 - Review Complete 12/04/2020  Allergen Reaction Noted   Sulfa antibiotics Rash 04/29/2019    Family History  Problem Relation Age of Onset   Colon cancer Mother 33       80's   Breast cancer Sister    Asthma Brother    Colon polyps Neg Hx    Esophageal cancer Neg Hx  Rectal cancer Neg Hx    Stomach cancer Neg Hx     Social History   Socioeconomic History   Marital status: Married    Spouse name: Not on file   Number of children: 1   Years of education: Not on file   Highest education level: Not on file  Occupational History   Occupation: retired  Tobacco Use   Smoking status: Former    Types: Cigarettes    Quit date: 12/23/1984    Years since quitting: 35.9   Smokeless tobacco: Never  Vaping Use   Vaping Use: Never used  Substance and Sexual Activity   Alcohol use: Yes    Comment: rare   Drug use: No   Sexual activity: Not on file  Other Topics Concern   Not on file  Social History Narrative   Patient is married and retired and has 1 grown child   Social Determinants of Radio broadcast assistant Strain: Not on file  Food Insecurity: Not on file  Transportation Needs: Not on file   Physical Activity: Not on file  Stress: Not on file  Social Connections: Not on file  Intimate Partner Violence: Not on file    Review of Systems: Gen: see HPI CV: Denies chest pain, heart palpitations, syncope, edema  Resp: Denies shortness of breath with rest, cough, wheezing GI: See HPI GU : Denies urinary burning, urinary frequency, urinary incontinence.  MS: Denies joint pain,swelling, cramping Derm: Denies rash, itching, dry skin Psych: Denies depression, anxiety,confusion, or memory loss Heme: see HPI  Physical Exam: Vital signs in last 24 hours: Temp:  [98.1 F (36.7 C)] 98.1 F (36.7 C) (09/12 1209) Pulse Rate:  [75-84] 75 (09/12 1300) Resp:  [15-18] 15 (09/12 1300) BP: (98-100)/(33-88) 98/54 (09/12 1300) SpO2:  [100 %] 100 % (09/12 1300) Weight:  [90.7 kg] 90.7 kg (09/12 1209)   General:   Alert,  Well-developed, well-nourished, pleasant and cooperative. Pale.  Head:  Normocephalic and atraumatic. Eyes:  Sclera clear, no icterus.    Ears:  Normal auditory acuity. Nose:  No deformity, discharge,  or lesions. Lungs:  Clear throughout to auscultation.   Heart:  S1 S2 present with systolic murmur Abdomen:  Soft, nontender and nondistended. No masses, hepatosplenomegaly or hernias noted. Normal bowel sounds, without guarding, and without rebound.   Rectal:  Deferred  Msk:  Symmetrical without gross deformities. Normal posture. Extremities:  Without edema. Neurologic:  Alert and  oriented x4 Psych:  Alert and cooperative. Normal mood and affect.  Intake/Output from previous day: No intake/output data recorded. Intake/Output this shift: No intake/output data recorded.  Lab Results: Recent Labs    12/04/20 1223  WBC 9.0  HGB 7.6*  HCT 24.3*  PLT 205   BMET Recent Labs    12/04/20 1223  NA 137  K 3.6  CL 107  CO2 24  GLUCOSE 122*  BUN 27*  CREATININE 0.63  CALCIUM 8.6*   LFT Recent Labs    12/04/20 1223  PROT 5.5*  ALBUMIN 3.6  AST 24   ALT 18  ALKPHOS 25*  BILITOT 0.5     Studies/Results: DG Chest Port 1 View  Result Date: 12/04/2020 CLINICAL DATA:  Shortness of breath EXAM: PORTABLE CHEST 1 VIEW COMPARISON:  Chest x-ray dated October 19, 2020 FINDINGS: Unchanged cardiomegaly. Mediastinal contours are within normal limits. Clear lungs. No pleural effusion or pneumothorax. IMPRESSION: No active disease. Electronically Signed   By: Yetta Glassman M.D.   On: 12/04/2020  14:02    Impression: Very pleasant 75 year old female with history of IDA felt secondary to small bowel AVMs undergoing extensive GI evaluation as noted above, presenting with acute on chronic symptomatic anemia with need for transfusion, associated melena, and heme positive stool. Hgb 7.6 on admission, down from 11.2 five days ago.  Most recent EGD in July 2022 during last admission for acute GI bleeding, which showed no obvious lesions. Capsule study then completed with duodenitis but no obvious AVMs. She has had 3 total capsule studies, with Nov 2021 capsule revealing AVMs non-bleeding in proximal small bowel. Colonoscopy on file from Nov 2021 with multiple adenomas, diverticulosis.   Troponins elevated likely due to demand ischemia. CXR unrevealing.   Plans for EGD with enteroscopy on 12/05/20. Query Dieulafoy as potential culprit. Thus far, it has been difficult to localize source of bleeding and has required multiple admissions with transfusion. May need to consider monthly Octreotide injections going forward if bleeding source remains obscure.   Plan: Agree with IV PPI BID Clear liquids NPO after midnight EGD with small bowel enteroscopy on 12/05/20 by Dr. Laural Golden. Discussed risks and benefits with patient who desires to proceed.   Annitta Needs, PhD, ANP-BC [facility]    LOS: 0 days    12/04/2020, 2:30 PM

## 2020-12-04 NOTE — ED Provider Notes (Signed)
[facility] EMERGENCY DEPARTMENT Provider Note   CSN: [id number] Arrival date & time: 12/04/20  1040     History Chief Complaint  Patient presents with   Rectal Bleeding    Stacey Reyes is a 75 y.o. female with a history of hypertension, hyperlipidemia, chronic anemia secondary to known AVMs by Givens capsule study under the care of Dr. Abbey Chatters presenting with a now 5-day history of dark tarry stools in association with generalized weakness, lightheadedness and shortness of breath with exertion.  She denies being on any blood thinning medications, does not use NSAIDs and denies abdominal pain or distention.  She denies chest pain, but does endorse noticing an achy sensation in her bilateral arms which is worse when supine which started about 1 week ago.  Denies palpitations or any other chest complaints.  She does undergo iron infusions and has had to have blood transfusions in the past secondary to symptomatic anemia.  The history is provided by the patient.      Past Medical History:  Diagnosis Date   Anemia    Cataract    removed bilateral   Chronic cystitis    Diverticulitis    Heart murmur    Hyperlipidemia    Hypertension    Personal history of colonic polyps-adenomas 01/07/2012   2009 - 2 diminutive adenomas (prior polyps also) 01/07/2012 - 2 diminutive adenomas      Patient Active Problem List   Diagnosis Date Noted   Melena    Elevated troponin 10/20/2020   GI bleed 10/20/2020   Symptomatic anemia 10/19/2020   Morning headache 03/08/2020   Hypertrophic cardiomyopathy (Major) 01/17/2020   Iron deficiency anemia 12/21/2019   Need for immunization against influenza 12/14/2019   Acute blood loss anemia 11/22/2019   Educated about COVID-19 virus infection 07/05/2019   Dyspnea on exertion 07/05/2019   B12 deficiency 03/25/2019   Absolute anemia 03/24/2019   Osteopenia after menopause 04/22/2018   Cardiac murmur due to mitral valve disorder 03/03/2018   Heart  murmur 03/03/2018   LVH (left ventricular hypertrophy) 11/20/2015   Arrhythmia 10/11/2015   Hypertension, essential 02/24/2015   Hyperlipidemia 02/24/2015   Personal history of colonic polyps-adenomas 01/07/2012    Past Surgical History:  Procedure Laterality Date   BREAST BIOPSY Right    No Scar seen    COLONOSCOPY  multiple   ESOPHAGOGASTRODUODENOSCOPY (EGD) WITH PROPOFOL N/A 11/23/2019   Procedure: ESOPHAGOGASTRODUODENOSCOPY (EGD) WITH PROPOFOL;  Surgeon: Daneil Dolin, MD;  [facility];  Service: Endoscopy;  Laterality: N/A;   ESOPHAGOGASTRODUODENOSCOPY (EGD) WITH PROPOFOL N/A 10/20/2020   Procedure: ESOPHAGOGASTRODUODENOSCOPY (EGD) WITH PROPOFOL;  Surgeon: Eloise Harman, DO;  [facility];  Service: Endoscopy;  Laterality: N/A;   GIVENS CAPSULE STUDY N/A 11/23/2019   Procedure: GIVENS CAPSULE STUDY;  Surgeon: Daneil Dolin, MD;  [facility];  Service: Endoscopy;  Laterality: N/A;   GIVENS CAPSULE STUDY N/A 10/22/2020   Procedure: GIVENS CAPSULE STUDY;  Surgeon: Eloise Harman, DO;  [facility];  Service: Endoscopy;  Laterality: N/A;     OB History   No obstetric history on file.     Family History  Problem Relation Age of Onset   Colon cancer Mother 54       80's   Breast cancer Sister    Asthma Brother    Colon polyps Neg Hx    Esophageal cancer Neg Hx    Rectal cancer Neg Hx    Stomach  cancer Neg Hx     Social History   Tobacco Use   Smoking status: Former    Types: Cigarettes    Quit date: 12/23/1984    Years since quitting: 35.9   Smokeless tobacco: Never  Vaping Use   Vaping Use: Never used  Substance Use Topics   Alcohol use: Yes    Comment: rare   Drug use: No    Home Medications Prior to Admission medications   Medication Sig Start Date End Date Taking? Authorizing Provider  Ascorbic Acid (VITAMIN C) 1000 MG tablet Take 1,000 mg by mouth daily.   Yes [provider]  bisoprolol  (ZEBETA) 5 MG tablet Take 0.5 tablets (2.5 mg total) by mouth daily. 10/27/20  Yes Lendon Colonel, NP  clobetasol ointment (TEMOVATE) AB-123456789 % Apply 1 application topically 2 (two) times daily as needed (skin). 10/26/20  Yes [provider]  Copper Gluconate (COPPER CAPS PO) Take 1 capsule by mouth daily.    Yes [provider]  cyanocobalamin ([employer]S VITAMIN B12) 2000 MCG tablet Take 1 tablet (2,000 mcg total) by mouth daily. 03/25/19  Yes Johnson, Clanford L, MD  Magnesium 200 MG TABS Take 1 tablet (200 mg total) by mouth daily. 03/23/19  Yes Lendon Colonel, NP  Multiple Minerals-Vitamins (CALCIUM-MAGNESIUM-ZINC-D3 PO) Take 1 tablet by mouth daily.   Yes [provider]  Multiple Vitamin (MULTIVITAMIN) capsule Take 1 capsule by mouth daily.    Yes [provider]  pantoprazole (PROTONIX) 40 MG tablet TAKE 1 TABLET BY MOUTH  DAILY Patient taking differently: Take 40 mg by mouth daily. 11/07/20  Yes Gottschalk, Ashly M, DO  rosuvastatin (CRESTOR) 10 MG tablet TAKE 1 TABLET BY MOUTH AT  BEDTIME Patient taking differently: Take 10 mg by mouth daily. 04/12/20  Yes Ronnie Doss M, DO  vitamin E 400 UNIT capsule Take 400 Units by mouth daily.    Yes [provider]    Allergies    Sulfa antibiotics  Review of Systems   Review of Systems  Constitutional:  Positive for fatigue. Negative for fever.  HENT: Negative.    Eyes: Negative.   Respiratory:  Positive for shortness of breath. Negative for chest tightness.   Cardiovascular:  Negative for chest pain.  Gastrointestinal:  Positive for blood in stool. Negative for abdominal pain, nausea and vomiting.  Genitourinary: Negative.   Musculoskeletal:  Negative for arthralgias, joint swelling and neck pain.  Skin: Negative.  Negative for rash and wound.  Neurological:  Positive for weakness. Negative for dizziness, light-headedness, numbness and headaches.  Psychiatric/Behavioral: Negative.    All  other systems reviewed and are negative.  Physical Exam Updated Vital Signs BP (!) 98/54   Pulse 75   Temp 98.1 F (36.7 C) (Oral)   Resp 15   Ht '5\' 5"'$  (1.651 m)   Wt 90.7 kg   SpO2 100%   BMI 33.28 kg/m   Physical Exam Vitals and nursing note reviewed.  Constitutional:      Appearance: She is well-developed.  HENT:     Head: Normocephalic and atraumatic.  Eyes:     Comments: Pale conjunctiva  Cardiovascular:     Rate and Rhythm: Normal rate and regular rhythm.     Heart sounds: Normal heart sounds.  Pulmonary:     Effort: Pulmonary effort is normal.     Breath sounds: Normal breath sounds. No wheezing.  Abdominal:     General: Bowel sounds are normal.     Palpations:  Abdomen is soft.     Tenderness: There is no abdominal tenderness. There is no guarding.  Genitourinary:    Comments: Strongly Hemoccult positive. Musculoskeletal:        General: Normal range of motion.     Cervical back: Normal range of motion.  Skin:    General: Skin is warm and dry.  Neurological:     General: No focal deficit present.     Mental Status: She is alert.    ED Results / Procedures / Treatments   Labs (all labs ordered are listed, but only abnormal results are displayed) Labs Reviewed  COMPREHENSIVE METABOLIC PANEL - Abnormal; Notable for the following components:      Result Value   Glucose, Bld 122 (*)    BUN 27 (*)    Calcium 8.6 (*)    Total Protein 5.5 (*)    Alkaline Phosphatase 25 (*)    All other components within normal limits  CBC - Abnormal; Notable for the following components:   RBC 2.45 (*)    Hemoglobin 7.6 (*)    HCT 24.3 (*)    All other components within normal limits  POC OCCULT BLOOD, ED - Abnormal; Notable for the following components:   Fecal Occult Bld POSITIVE (*)    All other components within normal limits  TROPONIN I (HIGH SENSITIVITY) - Abnormal; Notable for the following components:   Troponin I (High Sensitivity) 105 (*)    All other  components within normal limits  TYPE AND SCREEN  PREPARE RBC (CROSSMATCH)  TROPONIN I (HIGH SENSITIVITY)    EKG EKG Interpretation  Date/Time:  Monday December 04 2020 12:14:56 EDT Ventricular Rate:  76 PR Interval:  198 QRS Duration: 108 QT Interval:  422 QTC Calculation: 474 R Axis:   31 Text Interpretation: Normal sinus rhythm Left ventricular hypertrophy with repolarization abnormality ( Sokolow-Lyon ) Cannot rule out Septal infarct , age undetermined Abnormal ECG Confirmed by Milton Ferguson [phone]) on 12/04/2020 1:54:03 PM  Radiology DG Chest Port 1 View  Result Date: 12/04/2020 CLINICAL DATA:  Shortness of breath EXAM: PORTABLE CHEST 1 VIEW COMPARISON:  Chest x-ray dated October 19, 2020 FINDINGS: Unchanged cardiomegaly. Mediastinal contours are within normal limits. Clear lungs. No pleural effusion or pneumothorax. IMPRESSION: No active disease. Electronically Signed   By: Yetta Glassman M.D.   On: 12/04/2020 14:02    Procedures Procedures   Medications Ordered in ED Medications  0.9 %  sodium chloride infusion (has no administration in time range)    ED Course  I have reviewed the triage vital signs and the nursing notes.  Pertinent labs & imaging results that were available during my care of the patient were reviewed by me and considered in my medical decision making (see chart for details).    MDM Rules/Calculators/A&P                           Pt with acute GI bleed, symptomatic with lightheadedness and shortness of breath.  She has a significant drop in her hemoglobin today of 7.6 from 11.2 just 5 days ago.  She is Hemoccult positive.  She also has a BUN elevation at 27 but a normal creatinine.  Her troponin is elevated at 105, her EKG is similar to priors with no acute findings.  She denies chest pain but has had some shortness of breath, suspect that this elevation is due to demand ischemia.  Call placed to cardiology for  consult.  Patient will need admission to  the hospitalist service.  Discussed with Dr. Abbey Chatters who will also consult this patient here shortly.  I have ordered 2 units of packed red blood cells.  Spoke with Dr. Manuella Ghazi with [facility] who accepts pt for admission.   CRITICAL CARE Performed by: Evalee Jefferson Total critical care time: 45 minutes Critical care time was exclusive of separately billable procedures and treating other patients. Critical care was necessary to treat or prevent imminent or life-threatening deterioration. Critical care was time spent personally by me on the following activities: development of treatment plan with patient and/or surrogate as well as nursing, discussions with consultants, evaluation of patient's response to treatment, examination of patient, obtaining history from patient or surrogate, ordering and performing treatments and interventions, ordering and review of laboratory studies, ordering and review of radiographic studies, pulse oximetry and re-evaluation of patient's condition.  Final Clinical Impression(s) / ED Diagnoses Final diagnoses:  Acute GI bleeding  Shortness of breath    Rx / DC Orders ED Discharge Orders     None        Landis Martins 12/04/20 1415    Milton Ferguson, MD 12/07/20 [phone]

## 2020-12-05 ENCOUNTER — Inpatient Hospital Stay (HOSPITAL_COMMUNITY): Payer: Medicare Other | Admitting: Anesthesiology

## 2020-12-05 ENCOUNTER — Encounter (HOSPITAL_COMMUNITY)
Admission: EM | Disposition: A | Discharge status: Home / Self Care | Payer: Self-pay | Source: Home / Self Care | Attending: Internal Medicine

## 2020-12-05 ENCOUNTER — Encounter (HOSPITAL_COMMUNITY): Payer: Self-pay | Admitting: Internal Medicine

## 2020-12-05 ENCOUNTER — Inpatient Hospital Stay (HOSPITAL_COMMUNITY): Payer: Medicare Other

## 2020-12-05 ENCOUNTER — Ambulatory Visit (HOSPITAL_COMMUNITY): Payer: Medicare Other

## 2020-12-05 DIAGNOSIS — K921 Melena: Secondary | ICD-10-CM

## 2020-12-05 DIAGNOSIS — K317 Polyp of stomach and duodenum: Secondary | ICD-10-CM

## 2020-12-05 DIAGNOSIS — K449 Diaphragmatic hernia without obstruction or gangrene: Secondary | ICD-10-CM

## 2020-12-05 DIAGNOSIS — K922 Gastrointestinal hemorrhage, unspecified: Secondary | ICD-10-CM

## 2020-12-05 HISTORY — PX: IR ANGIOGRAM SELECTIVE EACH ADDITIONAL VESSEL: IMG667

## 2020-12-05 HISTORY — PX: ESOPHAGOGASTRODUODENOSCOPY (EGD) WITH PROPOFOL: SHX5813

## 2020-12-05 HISTORY — PX: IR EMBO ART  VEN HEMORR LYMPH EXTRAV  INC GUIDE ROADMAPPING: IMG5450

## 2020-12-05 HISTORY — PX: IR US GUIDE VASC ACCESS RIGHT: IMG2390

## 2020-12-05 HISTORY — PX: IR ANGIOGRAM VISCERAL SELECTIVE: IMG657

## 2020-12-05 LAB — CBC
HCT: 25.4 % — ABNORMAL LOW (ref 36.0–46.0)
Hemoglobin: 8.3 g/dL — ABNORMAL LOW (ref 12.0–15.0)
MCH: 31.2 pg (ref 26.0–34.0)
MCHC: 32.7 g/dL (ref 30.0–36.0)
MCV: 95.5 fL (ref 80.0–100.0)
Platelets: 149 10*3/uL — ABNORMAL LOW (ref 150–400)
RBC: 2.66 MIL/uL — ABNORMAL LOW (ref 3.87–5.11)
RDW: 15.1 % (ref 11.5–15.5)
WBC: 5.8 10*3/uL (ref 4.0–10.5)
nRBC: 0 % (ref 0.0–0.2)

## 2020-12-05 LAB — MAGNESIUM: Magnesium: 1.8 mg/dL (ref 1.7–2.4)

## 2020-12-05 LAB — HEMOGLOBIN AND HEMATOCRIT, BLOOD
HCT: 23.6 % — ABNORMAL LOW (ref 36.0–46.0)
Hemoglobin: 7.8 g/dL — ABNORMAL LOW (ref 12.0–15.0)

## 2020-12-05 LAB — SARS CORONAVIRUS 2 (TAT 6-24 HRS): SARS Coronavirus 2: NEGATIVE

## 2020-12-05 LAB — PREPARE RBC (CROSSMATCH)

## 2020-12-05 LAB — BASIC METABOLIC PANEL
Anion gap: 5 (ref 5–15)
BUN: 21 mg/dL (ref 8–23)
CO2: 24 mmol/L (ref 22–32)
Calcium: 7.8 mg/dL — ABNORMAL LOW (ref 8.9–10.3)
Chloride: 107 mmol/L (ref 98–111)
Creatinine, Ser: 0.66 mg/dL (ref 0.44–1.00)
GFR, Estimated: >60 mL/min (ref >60)
Glucose, Bld: 101 mg/dL — ABNORMAL HIGH (ref 70–99)
Potassium: 3.6 mmol/L (ref 3.5–5.1)
Sodium: 136 mmol/L (ref 135–145)

## 2020-12-05 SURGERY — ESOPHAGOGASTRODUODENOSCOPY (EGD) WITH PROPOFOL
Anesthesia: General

## 2020-12-05 MED ORDER — PROPOFOL 10 MG/ML IV BOLUS
INTRAVENOUS | Status: AC
  Filled 2020-12-05: qty 20

## 2020-12-05 MED ORDER — GLUCAGON HCL RDNA (DIAGNOSTIC) 1 MG IJ SOLR
INTRAMUSCULAR | Status: AC
  Filled 2020-12-05: qty 1

## 2020-12-05 MED ORDER — PHENYLEPHRINE 40 MCG/ML (10ML) SYRINGE FOR IV PUSH (FOR BLOOD PRESSURE SUPPORT)
PREFILLED_SYRINGE | INTRAVENOUS | Status: AC
  Filled 2020-12-05: qty 20

## 2020-12-05 MED ORDER — MIDAZOLAM HCL 2 MG/2ML IJ SOLN
INTRAMUSCULAR | Status: AC
  Filled 2020-12-05: qty 2

## 2020-12-05 MED ORDER — IOHEXOL 350 MG/ML SOLN
100.0000 mL | Freq: Once | INTRAVENOUS | Status: AC | PRN
  Administered 2020-12-05: 12 mL via INTRA_ARTERIAL

## 2020-12-05 MED ORDER — EPINEPHRINE 1 MG/10ML IJ SOSY
PREFILLED_SYRINGE | INTRAMUSCULAR | Status: AC
  Filled 2020-12-05: qty 10

## 2020-12-05 MED ORDER — SODIUM CHLORIDE 0.9 % IV SOLN: INTRAVENOUS | Status: DC

## 2020-12-05 MED ORDER — PROPOFOL 500 MG/50ML IV EMUL
INTRAVENOUS | Status: DC | PRN
  Administered 2020-12-05: 150 ug/kg/min via INTRAVENOUS

## 2020-12-05 MED ORDER — GELATIN ABSORBABLE 12-7 MM EX MISC
CUTANEOUS | Status: AC
  Filled 2020-12-05: qty 1

## 2020-12-05 MED ORDER — SODIUM CHLORIDE FLUSH 0.9 % IV SOLN
INTRAVENOUS | Status: AC
  Filled 2020-12-05: qty 10

## 2020-12-05 MED ORDER — SODIUM CHLORIDE 0.9% IV SOLUTION: Freq: Once | INTRAVENOUS | Status: DC

## 2020-12-05 MED ORDER — LIDOCAINE HCL 1 % IJ SOLN
INTRAMUSCULAR | Status: AC
  Filled 2020-12-05: qty 20

## 2020-12-05 MED ORDER — MIDAZOLAM HCL 2 MG/2ML IJ SOLN
INTRAMUSCULAR | Status: DC | PRN
Start: 2020-12-05 — End: 2020-12-07
  Administered 2020-12-05 (×2): .5 mg via INTRAVENOUS
  Administered 2020-12-05: 1 mg via INTRAVENOUS

## 2020-12-05 MED ORDER — CHLORHEXIDINE GLUCONATE CLOTH 2 % EX PADS
6.0000 | MEDICATED_PAD | Freq: Every day | CUTANEOUS | Status: DC
  Administered 2020-12-05 – 2020-12-07 (×3): 6 via TOPICAL

## 2020-12-05 MED ORDER — IOHEXOL 350 MG/ML SOLN
100.0000 mL | Freq: Once | INTRAVENOUS | Status: AC | PRN
  Administered 2020-12-05: 55 mL via INTRA_ARTERIAL

## 2020-12-05 MED ORDER — LACTATED RINGERS IV SOLN: INTRAVENOUS | Status: DC

## 2020-12-05 MED ORDER — PHENYLEPHRINE 40 MCG/ML (10ML) SYRINGE FOR IV PUSH (FOR BLOOD PRESSURE SUPPORT)
PREFILLED_SYRINGE | INTRAVENOUS | Status: DC | PRN
  Administered 2020-12-05 (×2): 80 ug via INTRAVENOUS
  Administered 2020-12-05 (×4): 120 ug via INTRAVENOUS
  Administered 2020-12-05 (×2): 80 ug via INTRAVENOUS
  Administered 2020-12-05 (×2): 120 ug via INTRAVENOUS

## 2020-12-05 MED ORDER — MELATONIN 3 MG PO TABS
6.0000 mg | ORAL_TABLET | Freq: Every day | ORAL | Status: DC
  Administered 2020-12-05 (×2): 6 mg via ORAL
  Filled 2020-12-05 (×2): qty 2

## 2020-12-05 MED ORDER — GLUCAGON HCL RDNA (DIAGNOSTIC) 1 MG IJ SOLR
INTRAMUSCULAR | Status: DC | PRN
  Administered 2020-12-05 (×3): .25 mg via INTRAVENOUS

## 2020-12-05 MED ORDER — FENTANYL CITRATE (PF) 100 MCG/2ML IJ SOLN
INTRAMUSCULAR | Status: DC | PRN
  Administered 2020-12-05: 25 ug via INTRAVENOUS
  Administered 2020-12-05: 50 ug via INTRAVENOUS
  Administered 2020-12-05: 25 ug via INTRAVENOUS

## 2020-12-05 MED ORDER — FENTANYL CITRATE (PF) 100 MCG/2ML IJ SOLN
INTRAMUSCULAR | Status: AC
  Filled 2020-12-05: qty 2

## 2020-12-05 MED ORDER — DEXMEDETOMIDINE (PRECEDEX) IN NS 20 MCG/5ML (4 MCG/ML) IV SYRINGE
PREFILLED_SYRINGE | INTRAVENOUS | Status: DC | PRN
  Administered 2020-12-05 (×2): 10 ug via INTRAVENOUS

## 2020-12-05 MED ORDER — DEXMEDETOMIDINE (PRECEDEX) IN NS 20 MCG/5ML (4 MCG/ML) IV SYRINGE
PREFILLED_SYRINGE | INTRAVENOUS | Status: AC
  Filled 2020-12-05: qty 5

## 2020-12-05 MED ORDER — SODIUM CHLORIDE (PF) 0.9 % IJ SOLN
PREFILLED_SYRINGE | INTRAMUSCULAR | Status: DC | PRN
  Administered 2020-12-05: 3 mL

## 2020-12-05 MED ORDER — EPHEDRINE SULFATE-NACL 50-0.9 MG/10ML-% IV SOSY
PREFILLED_SYRINGE | INTRAVENOUS | Status: DC | PRN
  Administered 2020-12-05: 5 mg via INTRAVENOUS

## 2020-12-05 MED ORDER — PROPOFOL 10 MG/ML IV BOLUS
INTRAVENOUS | Status: DC | PRN
  Administered 2020-12-05: 100 mg via INTRAVENOUS
  Administered 2020-12-05: 30 mg via INTRAVENOUS
  Administered 2020-12-05: 50 mg via INTRAVENOUS
  Administered 2020-12-05: 30 mg via INTRAVENOUS

## 2020-12-05 MED ORDER — LIDOCAINE HCL (CARDIAC) PF 100 MG/5ML IV SOSY
PREFILLED_SYRINGE | INTRAVENOUS | Status: DC | PRN
  Administered 2020-12-05: 50 mg via INTRAVENOUS

## 2020-12-05 NOTE — Procedures (Signed)
Pre-procedure Diagnosis: Acute upper GI bleeding d/t duodenal ulcer Post-procedure Diagnosis: Same  Post mesenteric arteriogram and empiric percutaneous coil embolization of the GDA and a hypertrophied pancreaticoduodenal artery.  Complications: None Immediate EBL: None  Keep right leg straight for 4 hrs (until 2000)  Signed: Sandi Mariscal Pager: [phone] 12/05/2020, 5:57 PM

## 2020-12-05 NOTE — Progress Notes (Signed)
Brief EGD note.  Normal mucosa of the esophagus 2 cm sliding hiatal hernia. Normal examination stomach. Fresh blood noted in the bulb and second part of the duodenum and finally traced to area along the medial wall distal to ampulla along the medial wall. Bleeding lesion could never be identified but suspected to be Dieulafoy lesion or diverticulum. Bleeding site could not be seen any better with duodenal scope. 3 mL of dilute epi 1 and 10,000 injected but without hemostasis.  Patient tolerated procedure well.

## 2020-12-05 NOTE — Progress Notes (Signed)
Patient expected in Interventional radiology this afternoon, arrangements made by Dr. Laural Golden. Patient stable, transferred by [facility] rescue squad to [facility]. To return this evening after procedure. Patient has communicated with family.

## 2020-12-05 NOTE — Consult Note (Signed)
Chief Complaint: Patient was seen in consultation today for GI bleed.  Referring Physician(s): Hildred Laser, MD  Supervising Physician: Sandi Mariscal  Patient Status: [facility] inpatient - transferred to [facility] IR for procedure  History of Present Illness: Stacey Reyes is a 75 y.o. female with a past medical history of with past medical history significant for anemia requiring IV iron infusions, HTN, HLD, diverticulitis, small bowel AVMs and previous GI bleeding who presented to [facility] ED on 9/12 with complaints of dark stools x 5 days with some associated dyspnea. She reported intermittent episodes of dark stools for several years with no active bleeding identified on previous exams. Initial evaluation in the ED noted a hgb of 7.6. GI was consulted and decision was made to proceed with EGD as the source of bleeding was felt to small bowel. EGD today noted blood in the second portion of the duodenum however the bleeding could not be localized. IR was consulted for possible angiogram and embolization - case reviewed by Dr. Pascal Lux who agrees to procedure.  [name] presented to [facility] IR via [facility] this afternoon, she denies any complaints currently - specifically she has no abdominal pain, n/v, chest pain or dyspnea. She is overwhelmed with all of the recent information she's been given but understands what we will be doing today and is agreeable to proceed. Discussed procedure with patient's son Stacey Reyes via phone today who gives verbal consent to proceed as patient just recently received propofol for EGD.    Past Medical History:  Diagnosis Date   Anemia    Cataract    removed bilateral   Chronic cystitis    Diverticulitis    Heart murmur    Hyperlipidemia    Hypertension    Personal history of colonic polyps-adenomas 01/07/2012   2009 - 2 diminutive adenomas (prior polyps also) 01/07/2012 - 2 diminutive adenomas      Past Surgical History:  Procedure Laterality Date    BREAST BIOPSY Right    No Scar seen    COLONOSCOPY  multiple   ESOPHAGOGASTRODUODENOSCOPY (EGD) WITH PROPOFOL N/A 11/23/2019   Procedure: ESOPHAGOGASTRODUODENOSCOPY (EGD) WITH PROPOFOL;  Surgeon: Daneil Dolin, MD;  Location: [facility] ENDO SUITE;  Service: Endoscopy;  Laterality: N/A;   ESOPHAGOGASTRODUODENOSCOPY (EGD) WITH PROPOFOL N/A 10/20/2020   Procedure: ESOPHAGOGASTRODUODENOSCOPY (EGD) WITH PROPOFOL;  Surgeon: Eloise Harman, DO;  Location: [facility] ENDO SUITE;  Service: Endoscopy;  Laterality: N/A;   GIVENS CAPSULE STUDY N/A 11/23/2019   Procedure: GIVENS CAPSULE STUDY;  Surgeon: Daneil Dolin, MD;  Location: [facility] ENDO SUITE;  Service: Endoscopy;  Laterality: N/A;   GIVENS CAPSULE STUDY N/A 10/22/2020   Procedure: GIVENS CAPSULE STUDY;  Surgeon: Eloise Harman, DO;  Location: [facility] ENDO SUITE;  Service: Endoscopy;  Laterality: N/A;    Allergies: Sulfa antibiotics  Medications: Prior to Admission medications   Medication Sig Start Date End Date Taking? Authorizing Provider  Ascorbic Acid (VITAMIN C) 1000 MG tablet Take 1,000 mg by mouth daily.   Yes [provider]  bisoprolol (ZEBETA) 5 MG tablet Take 0.5 tablets (2.5 mg total) by mouth daily. 10/27/20  Yes Lendon Colonel, NP  clobetasol ointment (TEMOVATE) [id number] % Apply 1 application topically 2 (two) times daily as needed (skin). 10/26/20  Yes [provider]  Copper Gluconate (COPPER CAPS PO) Take 1 capsule by mouth daily.    Yes [provider]  cyanocobalamin ([employer] VITAMIN B12) 2000 MCG tablet Take 1 tablet (2,000 mcg total) by  mouth daily. 03/25/19  Yes Johnson, Clanford L, MD  Magnesium 200 MG TABS Take 1 tablet (200 mg total) by mouth daily. 03/23/19  Yes Lendon Colonel, NP  Multiple Minerals-Vitamins (CALCIUM-MAGNESIUM-ZINC-D3 PO) Take 1 tablet by mouth daily.   Yes [provider]  Multiple Vitamin (MULTIVITAMIN) capsule Take 1 capsule by mouth daily.    Yes [provider]   pantoprazole (PROTONIX) 40 MG tablet TAKE 1 TABLET BY MOUTH  DAILY Patient taking differently: Take 40 mg by mouth daily. 11/07/20  Yes Gottschalk, Ashly M, DO  rosuvastatin (CRESTOR) 10 MG tablet TAKE 1 TABLET BY MOUTH AT  BEDTIME Patient taking differently: Take 10 mg by mouth daily. 04/12/20  Yes Ronnie Doss M, DO  vitamin E 400 UNIT capsule Take 400 Units by mouth daily.    Yes [provider]     Family History  Problem Relation Age of Onset   Colon cancer Mother 40       80's   Breast cancer Sister    Asthma Brother    Colon polyps Neg Hx    Esophageal cancer Neg Hx    Rectal cancer Neg Hx    Stomach cancer Neg Hx     Social History   Socioeconomic History   Marital status: Married    Spouse name: Not on file   Number of children: 1   Years of education: Not on file   Highest education level: Not on file  Occupational History   Occupation: retired  Tobacco Use   Smoking status: Former    Types: Cigarettes    Quit date: 12/23/1984    Years since quitting: 35.9   Smokeless tobacco: Never  Vaping Use   Vaping Use: Never used  Substance and Sexual Activity   Alcohol use: Yes    Comment: rare   Drug use: No   Sexual activity: Not on file  Other Topics Concern   Not on file  Social History Narrative   Patient is married and retired and has 1 grown child   Social Determinants of Radio broadcast assistant Strain: Not on file  Food Insecurity: Not on file  Transportation Needs: Not on file  Physical Activity: Not on file  Stress: Not on file  Social Connections: Not on file     Review of Systems: A 12 point ROS discussed and pertinent positives are indicated in the HPI above.  All other systems are negative.  Review of Systems  Constitutional:  Negative for chills and fever.  Respiratory:  Negative for cough and shortness of breath.   Cardiovascular:  Negative for chest pain.  Gastrointestinal:  Positive for blood in stool. Negative for  abdominal pain, nausea and vomiting.  Musculoskeletal:  Negative for back pain.  Skin:  Negative for pallor.  Neurological:  Negative for headaches.   Vital Signs: BP 136/60   Pulse 78   Temp 98.4 F (36.9 C)   Resp 15   Ht '5\' 5"'$  (1.651 m)   Wt 196 lb 10.4 oz (89.2 kg)   SpO2 98%   BMI 32.72 kg/m   Physical Exam Vitals and nursing note reviewed.  Constitutional:      General: She is not in acute distress. HENT:     Head: Normocephalic.     Mouth/Throat:     Mouth: Mucous membranes are moist.     Pharynx: Oropharynx is clear. No oropharyngeal exudate or posterior oropharyngeal erythema.  Cardiovascular:     Rate and Rhythm:  Normal rate and regular rhythm.  Pulmonary:     Effort: Pulmonary effort is normal.     Breath sounds: Normal breath sounds.  Abdominal:     General: There is no distension.     Palpations: Abdomen is soft.     Tenderness: There is no abdominal tenderness.  Skin:    General: Skin is warm and dry.     Coloration: Skin is not pale.  Neurological:     Mental Status: She is alert and oriented to person, place, and time.  Psychiatric:        Mood and Affect: Mood normal.        Behavior: Behavior normal.        Thought Content: Thought content normal.        Judgment: Judgment normal.     MD Evaluation Airway: WNL Heart: WNL Abdomen: WNL Chest/ Lungs: WNL ASA  Classification: 2 Mallampati/Airway Score: Two   Imaging: DG Chest Port 1 View  Result Date: 12/04/2020 CLINICAL DATA:  Shortness of breath EXAM: PORTABLE CHEST 1 VIEW COMPARISON:  Chest x-ray dated October 19, 2020 FINDINGS: Unchanged cardiomegaly. Mediastinal contours are within normal limits. Clear lungs. No pleural effusion or pneumothorax. IMPRESSION: No active disease. Electronically Signed   By: Yetta Glassman M.D.   On: 12/04/2020 14:02   ECHOCARDIOGRAM COMPLETE  Result Date: 11/20/2020    ECHOCARDIOGRAM REPORT   Patient Name:   JAYDEN WIGGERS Date of Exam: 11/20/2020  Medical Rec #:  [id number]         Height:       61.0 in Accession #:    [id number]        Weight:       199.0 lb Date of Birth:  22-Jun-1945         BSA:          1.885 m Patient Age:    74 years          BP:           123/49 mmHg Patient Gender: F                 HR:           51 bpm. Exam Location:  [place] Procedure: 2D Echo, Cardiac Doppler, Color Doppler and Intracardiac            Opacification Agent Indications:    I42.1 HOCM  History:        Patient has prior history of Echocardiogram examinations, most                 recent 03/24/2019. Risk Factors:Dyslipidemia and Hypertension.                 HOCM. Murmur.  Sonographer:    Wilford Sports Rodgers-Jones RDCS Referring Phys: [place]  1. Hypertrophic cardiomyopathy. Systolic anterior motion of the mitral valve is noted. Significant intracavitary gradient noted. Peak velocity 5.1 m/s. Peak gradient 104.2 mmHg. Left ventricular ejection fraction, by estimation, is >75%. The left ventricle has hyperdynamic function. The left ventricle has no regional wall motion abnormalities. There is severe concentric left ventricular hypertrophy. Left ventricular diastolic parameters are consistent with Grade I diastolic dysfunction (impaired relaxation). Elevated left ventricular end-diastolic pressure.  2. Right ventricular systolic function is normal. The right ventricular size is normal.  3. Left atrial size was severely dilated.  4. The mitral valve is normal in structure. Mild mitral valve regurgitation. No evidence of mitral stenosis.  5. The aortic valve is calcified. There is mild calcification of the aortic valve. There is mild thickening of the aortic valve. Aortic valve regurgitation is not visualized. Mild aortic valve stenosis. Aortic valve mean gradient measures 14.2 mmHg. Aortic valve Vmax measures 2.59 m/s.  6. Aortic dilatation noted. There is mild dilatation of the ascending aorta, measuring 37 mm.  7. The inferior vena cava is  normal in size with greater than 50% respiratory variability, suggesting right atrial pressure of 3 mmHg. Conclusion(s)/Recommendation(s): Findings consistent with hypertrophic cardiomyopathy. FINDINGS  Left Ventricle: Hypertrophic cardiomyopathy. Systolic anterior motion of the mitral valve is noted. Significant intracavitary gradient noted. Peak velocity 5.1 m/s. Peak gradient 104.2 mmHg. Left ventricular ejection fraction, by estimation, is >75%. The left ventricle has hyperdynamic function. The left ventricle has no regional wall motion abnormalities. Definity contrast agent was given IV to delineate the left ventricular endocardial borders. The left ventricular internal cavity size was normal in size. There is severe concentric left ventricular hypertrophy. Left ventricular diastolic parameters are consistent with Grade I diastolic dysfunction (impaired relaxation). Elevated left ventricular end-diastolic pressure. Right Ventricle: The right ventricular size is normal. No increase in right ventricular wall thickness. Right ventricular systolic function is normal. Left Atrium: Left atrial size was severely dilated. Right Atrium: Right atrial size was normal in size. Pericardium: Trivial pericardial effusion is present. Mitral Valve: The mitral valve is normal in structure. Mild mitral valve regurgitation. No evidence of mitral valve stenosis. Tricuspid Valve: The tricuspid valve is normal in structure. Tricuspid valve regurgitation is trivial. No evidence of tricuspid stenosis. Aortic Valve: The aortic valve is calcified. There is mild calcification of the aortic valve. There is mild thickening of the aortic valve. Aortic valve regurgitation is not visualized. Mild aortic stenosis is present. Aortic valve mean gradient measures  14.2 mmHg. Aortic valve peak gradient measures 26.9 mmHg. Pulmonic Valve: The pulmonic valve was normal in structure. Pulmonic valve regurgitation is not visualized. No evidence of  pulmonic stenosis. Aorta: Aortic dilatation noted. There is mild dilatation of the ascending aorta, measuring 37 mm. Venous: The inferior vena cava is normal in size with greater than 50% respiratory variability, suggesting right atrial pressure of 3 mmHg. IAS/Shunts: No atrial level shunt detected by color flow Doppler.  LEFT VENTRICLE PLAX 2D LVIDd:         3.60 cm  Diastology LVIDs:         1.30 cm  LV e' medial:    4.03 cm/s LV PW:         2.30 cm  LV E/e' medial:  22.8 LV IVS:        1.90 cm  LV e' lateral:   6.31 cm/s LVOT diam:     2.10 cm  LV E/e' lateral: 14.6 LVOT Area:     3.46 cm  RIGHT VENTRICLE             IVC RV Basal diam:  4.10 cm     IVC diam: 1.30 cm RV S prime:     15.20 cm/s TAPSE (M-mode): 3.7 cm LEFT ATRIUM             Index       RIGHT ATRIUM           Index LA diam:        4.20 cm 2.23 cm/m  RA Area:     11.50 cm LA Vol (A2C):   96.2 ml 51.04 ml/m RA Volume:   27.70 ml  14.70  ml/m LA Vol (A4C):   74.0 ml 39.26 ml/m LA Biplane Vol: 90.4 ml 47.96 ml/m  AORTIC VALVE AV Vmax:      259.26 cm/s AV Vmean:     167.087 cm/s AV VTI:       0.564 m AV Peak Grad: 26.9 mmHg AV Mean Grad: 14.2 mmHg  AORTA Ao Root diam: 3.50 cm Ao Asc diam:  3.70 cm MITRAL VALVE MV Area (PHT): 2.34 cm     SHUNTS MV Decel Time: 324 msec     Systemic Diam: 2.10 cm MV E velocity: 91.90 cm/s MV A velocity: 121.00 cm/s MV E/A ratio:  0.76 Skeet Latch MD Electronically signed by Skeet Latch MD Signature Date/Time: 11/20/2020/3:23:04 PM    Final     Labs:  CBC: Recent Labs    11/17/20 1001 11/29/20 1251 12/04/20 1223 12/05/20 0449  WBC 5.2 7.6 9.0 5.8  HGB 9.5* 11.2* 7.6* 8.3*  HCT 30.6* 34.8* 24.3* 25.4*  PLT 187 254 205 149*    COAGS: Recent Labs    10/19/20 1919 10/20/20 0439  INR 1.0 1.1  APTT  --  29    BMP: Recent Labs    12/12/19 0844 12/27/19 1354 07/04/20 0910 10/20/20 0439 10/21/20 0421 10/25/20 1112 12/04/20 1223 12/05/20 0449  NA 136 135   < > 138 138 138 137 136   K 3.7 3.7   < > 3.7 3.6 3.9 3.6 3.6  CL 106 101   < > 108 107 104 107 107  CO2 23 25   < > '25 25 20 24 24  '$ GLUCOSE 112* 105*   < > 108* 100* 135* 122* 101*  BUN 11 10   < > 20 13 7* 27* 21  CALCIUM 8.4* 8.9   < > 8.2* 8.5* 8.5* 8.6* 7.8*  CREATININE 0.50 0.61   < > 0.61 0.59 0.74 0.63 0.66  GFRNONAA >60 >60   < > >60 >60  --  >60 >60  GFRAA >60 >60  --   --   --   --   --   --    < > = values in this interval not displayed.    LIVER FUNCTION TESTS: Recent Labs    10/19/20 1244 10/19/20 1919 10/20/20 0439 12/04/20 1223  BILITOT 0.2 0.4 0.8 0.5  AST 34 35 32 24  ALT 30 33 30 18  ALKPHOS 33* 28* 21* 25*  PROT 5.5* 5.7* 5.0* 5.5*  ALBUMIN 3.9 3.6 3.1* 3.6    TUMOR MARKERS: No results for input(s): AFPTM, CEA, CA199, CHROMGRNA in the last 8760 hours.  Assessment and Plan:  75 y/o F with history of diverticulitis, small bowel AVMs and previous GI bleeding who presented to [facility] ED on 9/12 with complaints of dark stool x 5 days, she noted multiple previous episodes of the same over the last 2 years however a definite source was not found. EGD performed today by GI noted blood in the second portion of the duodenum however the bleeding could not be localized. IR was consulted for possible angiogram and embolization - case reviewed by Dr. Pascal Lux who agrees to procedure.   Patient transferred to [facility] IR for procedure with plans to return to [facility] same day for ongoing care. Patient will require ICU level of care due to femoral artery puncture, this was discussed with hospitalist (Dr. Manuella Ghazi) via Epic secure chat.   Risks and benefits of abdominal/pelvis arteriogram with intervention were discussed with the patient including, but not limited  to bleeding, infection, vascular injury, contrast induced renal failure, non target embolization, stroke, reperfusion hemorrhage, or even death.  This interventional procedure involves the use of X-rays and because of the nature of the planned procedure, it is  possible that we will have prolonged use of X-ray fluoroscopy.  Potential radiation risks to you include (but are not limited to) the following: - A slightly elevated risk for cancer  several years later in life. This risk is typically less than 0.5% percent. This risk is low in comparison to the normal incidence of human cancer, which is 33% for women and 50% for men according to the [place]. - Radiation induced injury can include skin redness, resembling a rash, tissue breakdown / ulcers and hair loss (which can be temporary or permanent).  The likelihood of either of these occurring depends on the difficulty of the procedure and whether you are sensitive to radiation due to previous procedures, disease, or genetic conditions.  IF your procedure requires a prolonged use of radiation, you will be notified and given written instructions for further action.  It is your responsibility to monitor the irradiated area for the 2 weeks following the procedure and to notify your physician if you are concerned that you have suffered a radiation induced injury.    All of the patient's questions were answered, patient is agreeable to proceed.  Consent signed and in chart.  Thank you for this interesting consult.  I greatly enjoyed meeting KEILYNN DECOUX and look forward to participating in their care.  A copy of this report was sent to the requesting provider on this date.  Electronically Signed: Joaquim Nam, PA-C 12/05/2020, 4:28 PM   I spent a total of 40 Minutes in face to face in clinical consultation, greater than 50% of which was counseling/coordinating care for GI bleed.

## 2020-12-05 NOTE — ED Notes (Signed)
Pt placed on cardiac monitor only for pt comfort, will check BP and Pulse Ox every 4 hours.

## 2020-12-05 NOTE — ED Notes (Signed)
Pt ambulatory to bathroom, gait steady

## 2020-12-05 NOTE — Progress Notes (Signed)
Pt will be transferred to ICU from [facility]. Called report to ICU [facility].

## 2020-12-05 NOTE — Anesthesia Procedure Notes (Signed)
Date/Time: 12/05/2020 12:40 PM Performed by: Orlie Dakin, CRNA Pre-anesthesia Checklist: Patient identified, Emergency Drugs available, Suction available and Patient being monitored Patient Re-evaluated:Patient Re-evaluated prior to induction Oxygen Delivery Method: Nasal cannula Induction Type: IV induction Placement Confirmation: positive ETCO2

## 2020-12-05 NOTE — Progress Notes (Signed)
   12/05/20 1421  Medical Necessity for Transport Certificate --- IF THIS TRANSPORT IS ROUND TRIP OR SCHEDULED AND REPEATED, A PHYSICIAN MUST COMPLETE THIS FORM  Transport from: (Location) [facility]  Transport to (Location) [facility]  Did the patient arrive from a [place], [place] or Group Home? No  Is this the closest appropriate facility? Yes  Date of Transport Service 12/05/20  Name of [facility]  Round Trip Transport? Yes  Reason for Transport Procedure  Is this a hospital to hospital transfer? No  Is this a hospice patient? No  Describe the Medical Condition GI bleed  Q1 Are ALL the following "true"? 1. Patient unable to get up from bed without assistance  AND  2. Unable to ambulate  AND  3. Unable to sit in a chair, including wheelchair. Yes  Q2 Could the patient be transported safely by other means of transportation (I.E., wheelchair van)? No  Q3 Please check any of the following conditions that apply at the time of transport: Requires continuous oxygen;Requires IV maintenance;Risk of injury to self and/or others  Electronic Signature [place] RN  Date Signed 12/05/20  Print Form Print

## 2020-12-05 NOTE — Anesthesia Preprocedure Evaluation (Addendum)
Anesthesia Evaluation  Patient identified by MRN, date of birth, ID band Patient awake    Reviewed: Allergy & Precautions, H&P , NPO status , Patient's Chart, lab work & pertinent test results, reviewed documented beta blocker date and time   Airway Mallampati: II  TM Distance: >3 FB Neck ROM: full    Dental no notable dental hx. (+) Caps, Dental Advisory Given   Pulmonary neg pulmonary ROS, former smoker,    Pulmonary exam normal breath sounds clear to auscultation       Cardiovascular Exercise Tolerance: Good hypertension, + Valvular Problems/Murmurs MR  Rhythm:regular Rate:Normal     Neuro/Psych  Headaches, negative psych ROS   GI/Hepatic negative GI ROS, Neg liver ROS,   Endo/Other  negative endocrine ROS  Renal/GU negative Renal ROS  negative genitourinary   Musculoskeletal   Abdominal   Peds  Hematology  (+) Blood dyscrasia, anemia ,   Anesthesia Other Findings 1. Hypertrophic cardiomyopathy. Systolic anterior motion of the mitral  valve is noted. Significant intracavitary gradient noted. Peak velocity  5.1 m/s. Peak gradient 104.2 mmHg. Left ventricular ejection fraction, by  estimation, is >75%. The left  ventricle has hyperdynamic function. The left ventricle has no regional  wall motion abnormalities. There is severe concentric left ventricular  hypertrophy. Left ventricular diastolic parameters are consistent with  Grade I diastolic dysfunction (impaired  relaxation). Elevated left ventricular end-diastolic pressure.  2. Right ventricular systolic function is normal. The right ventricular  size is normal.  3. Left atrial size was severely dilated.  4. The mitral valve is normal in structure. Mild mitral valve  regurgitation. No evidence of mitral stenosis.  5. The aortic valve is calcified. There is mild calcification of the  aortic valve. There is mild thickening of the aortic valve. Aortic valve   regurgitation is not visualized. Mild aortic valve stenosis. Aortic valve  mean gradient measures 14.2 mmHg.  Aortic valve Vmax measures 2.59 m/s.  6. Aortic dilatation noted. There is mild dilatation of the ascending  aorta, measuring 37 mm.  7. The inferior vena cava is normal in size with greater than 50%  respiratory variability, suggesting right atrial pressure of 3 mmHg.   Reproductive/Obstetrics negative OB ROS                            Anesthesia Physical  Anesthesia Plan  ASA: 3 and emergent  Anesthesia Plan: General   Post-op Pain Management:    Induction:   PONV Risk Score and Plan: Propofol infusion  Airway Management Planned:   Additional Equipment:   Intra-op Plan:   Post-operative Plan:   Informed Consent: I have reviewed the patients History and Physical, chart, labs and discussed the procedure including the risks, benefits and alternatives for the proposed anesthesia with the patient or authorized representative who has indicated his/her understanding and acceptance.     Dental Advisory Given  Plan Discussed with: CRNA  Anesthesia Plan Comments:        Anesthesia Quick Evaluation

## 2020-12-05 NOTE — Transfer of Care (Signed)
Immediate Anesthesia Transfer of Care Note  Patient: Stacey Reyes  Procedure(s) Performed: ESOPHAGOGASTRODUODENOSCOPY (EGD) WITH PROPOFOL  Patient Location: PACU  Anesthesia Type:General  Level of Consciousness: awake  Airway & Oxygen Therapy: Patient Spontanous Breathing and Patient connected to nasal cannula oxygen  Post-op Assessment: Report given to RN and Post -op Vital signs reviewed and stable  Post vital signs: Reviewed and stable  Last Vitals:  Vitals Value Taken Time  BP    Temp    Pulse    Resp    SpO2      Last Pain:  Vitals:   12/05/20 1106  TempSrc: Oral  PainSc: 0-No pain         Complications: No notable events documented.

## 2020-12-05 NOTE — Progress Notes (Addendum)
PROGRESS NOTE    Stacey Reyes  [id number] DOB: [birth year] DOA: 12/04/2020 PCP: [name], DO   Brief Narrative:   Stacey Reyes is a 75 y.o. female with medical history significant for hypertension, hyperlipidemia, GERD, small bowel AVMs, and chronic anemia requiring iron transfusions who presented to the ED with complaints of dark stools over the last 5 days.  She was admitted for acute on chronic blood loss anemia and has required 2 unit PRBC transfusion.  She has undergone EGD on 9/13 with bleeding Dieulafoy lesion suspected and will require transfer to [facility] for angiogram and embolization per IR today. Plan to transfuse another 2U PRBC for now.  Assessment & Plan:   Active Problems:   Acute blood loss anemia   Acute GI bleeding   Acute on chronic blood loss anemia likely due to bleeding Dieulafoy lesion or diverticulum -Improved after 2 unit PRBC transfusion on 9/12, but another 2 units ordered given active bleeding noted on EGD on 9/13 -Patient will require transfer to [facility] so that IR can do angiogram with embolization with return afterwards -PPI twice daily -Continue n.p.o. -Appreciate further IR/GI recommendations   Elevated troponin-in the setting of GI bleed -Likely due to demand ischemia, no signs of ACS noted -Continue to monitor   Hypertension/Dyslipidemia -Continue bisoprolol and hold Crestor for now   Hypertrophic cardiomyopathy -Continue bisoprolol -Follow-up with cardiology outpatient    DVT prophylaxis:SCDs Code Status: Full Family Communication: None at bedside Disposition Plan:  Status is: Inpatient  Remains inpatient appropriate because:Ongoing diagnostic testing needed not appropriate for outpatient work up, IV treatments appropriate due to intensity of illness or inability to take PO, and Inpatient level of care appropriate due to severity of illness  Dispo: The patient is from: Home              Anticipated d/c is  to: Home              Patient currently is not medically stable to d/c.   Difficult to place patient No   Consultants:  GI IR  Procedures:  EGD 9/13  Antimicrobials:  None   Subjective: Patient seen and evaluated today with no new acute complaints or concerns.  She has less shortness of breath and lightheadedness this morning after PRBC transfusion.  She has not had any further episodes of melena.  No acute concerns or events noted overnight.  EGD later today did demonstrate an acute bleed for which patient will require transfer for embolization.  Objective: Vitals:   12/05/20 0600 12/05/20 0700 12/05/20 0941 12/05/20 1106  BP: (!) 111/54 120/60 137/79 128/61  Pulse:  72 73 76  Resp: '12 11 18 12  '$ Temp:   98.5 F (36.9 C) 98.5 F (36.9 C)  TempSrc:   Oral Oral  SpO2: 96% 98% 99% 96%  Weight:   89.2 kg   Height:   '5\' 5"'$  (1.651 m)     Intake/Output Summary (Last 24 hours) at 12/05/2020 1411 Last data filed at 12/05/2020 1405 Gross per 24 hour  Intake 1465 ml  Output --  Net 1465 ml   Filed Weights   12/04/20 1209 12/05/20 0941  Weight: 90.7 kg 89.2 kg    Examination:  General exam: Appears calm and comfortable  Respiratory system: Clear to auscultation. Respiratory effort normal. Cardiovascular system: S1 & S2 heard, RRR.  Gastrointestinal system: Abdomen is soft Central nervous system: Alert and awake Extremities: No edema Skin: No significant lesions noted Psychiatry:  Flat affect.    Data Reviewed: I have personally reviewed following labs and imaging studies  CBC: Recent Labs  Lab 11/29/20 1251 12/04/20 1223 12/05/20 0449  WBC 7.6 9.0 5.8  NEUTROABS 4.8  --   --   HGB 11.2* 7.6* 8.3*  HCT 34.8* 24.3* 25.4*  MCV 93.8 99.2 95.5  PLT 254 205 123456*   Basic Metabolic Panel: Recent Labs  Lab 12/04/20 1223 12/05/20 0449  NA 137 136  K 3.6 3.6  CL 107 107  CO2 24 24  GLUCOSE 122* 101*  BUN 27* 21  CREATININE 0.63 0.66  CALCIUM 8.6* 7.8*  MG   --  1.8   GFR: Estimated Creatinine Clearance: 67 mL/min (by C-G formula based on SCr of 0.66 mg/dL). Liver Function Tests: Recent Labs  Lab 12/04/20 1223  AST 24  ALT 18  ALKPHOS 25*  BILITOT 0.5  PROT 5.5*  ALBUMIN 3.6   No results for input(s): LIPASE, AMYLASE in the last 168 hours. No results for input(s): AMMONIA in the last 168 hours. Coagulation Profile: No results for input(s): INR, PROTIME in the last 168 hours. Cardiac Enzymes: No results for input(s): CKTOTAL, CKMB, CKMBINDEX, TROPONINI in the last 168 hours. BNP (last 3 results) Recent Labs    10/19/20 1244 10/25/20 1112  PROBNP 2,974* 1,440*   HbA1C: No results for input(s): HGBA1C in the last 72 hours. CBG: No results for input(s): GLUCAP in the last 168 hours. Lipid Profile: No results for input(s): CHOL, HDL, LDLCALC, TRIG, CHOLHDL, LDLDIRECT in the last 72 hours. Thyroid Function Tests: No results for input(s): TSH, T4TOTAL, FREET4, T3FREE, THYROIDAB in the last 72 hours. Anemia Panel: No results for input(s): VITAMINB12, FOLATE, FERRITIN, TIBC, IRON, RETICCTPCT in the last 72 hours. Sepsis Labs: No results for input(s): PROCALCITON, LATICACIDVEN in the last 168 hours.  Recent Results (from the past 240 hour(s))  SARS CORONAVIRUS 2 (TAT 6-24 HRS) Nasopharyngeal Nasopharyngeal Swab     Status: None   Collection Time: 12/04/20  2:30 PM   Specimen: Nasopharyngeal Swab  Result Value Ref Range Status   SARS Coronavirus 2 NEGATIVE NEGATIVE Final    Comment: (NOTE) SARS-CoV-2 target nucleic acids are NOT DETECTED.  The SARS-CoV-2 RNA is generally detectable in upper and lower respiratory specimens during the acute phase of infection. Negative results do not preclude SARS-CoV-2 infection, do not rule out co-infections with other pathogens, and should not be used as the sole basis for treatment or other patient management decisions. Negative results must be combined with clinical observations, patient  history, and epidemiological information. The expected result is Negative.  Fact Sheet for Patients: SugarRoll.be  Fact Sheet for Healthcare Providers: https://www.woods-mathews.com/  This test is not yet approved or cleared by the [place] FDA and  has been authorized for detection and/or diagnosis of SARS-CoV-2 by FDA under an Emergency Use Authorization (EUA). This EUA will remain  in effect (meaning this test can be used) for the duration of the COVID-19 declaration under Se ction 564(b)(1) of the Act, 21 U.S.C. section 360bbb-3(b)(1), unless the authorization is terminated or revoked sooner.  Performed at [facility], [place] [address], [place], [place] [address]          Radiology Studies: DG Chest Port 1 View  Result Date: 12/04/2020 CLINICAL DATA:  Shortness of breath EXAM: PORTABLE CHEST 1 VIEW COMPARISON:  Chest x-ray dated October 19, 2020 FINDINGS: Unchanged cardiomegaly. Mediastinal contours are within normal limits. Clear lungs. No pleural effusion or pneumothorax.  IMPRESSION: No active disease. Electronically Signed   By: Yetta Glassman M.D.   On: 12/04/2020 14:02        Scheduled Meds:  sodium chloride   Intravenous Once   [MAR Hold] bisoprolol  2.5 mg Oral Daily   [MAR Hold] melatonin  6 mg Oral QHS   [MAR Hold] pantoprazole (PROTONIX) IV  40 mg Intravenous Q12H   [MAR Hold] sodium chloride flush  3 mL Intravenous Q12H   Continuous Infusions:  [MAR Hold] sodium chloride     sodium chloride     lactated ringers 10 mL/hr at 12/05/20 1236     LOS: 1 day    Time spent: 35 minutes    Breeze Angell D Manuella Ghazi, DO [facility]  If 7PM-7AM, please contact night-coverage [email or web address] 12/05/2020, 2:11 PM

## 2020-12-05 NOTE — Op Note (Addendum)
[facility] Patient Name: Stacey Reyes Procedure Date: 12/05/2020 11:16 AM MRN: [id number] Date of Birth: 01-Dec-1945 Attending MD: Hildred Laser , MD CSN: [id number] Age: 75 Admit Type: Inpatient Procedure:                Upper GI endoscopy Indications:              Melena Providers:                Hildred Laser, MD, Janeece Riggers, RN, Raphael Gibney,                            Technician Referring MD:             Heath Lark, DO Medicines:                Propofol per Anesthesia Complications:            No immediate complications. Estimated Blood Loss:     Estimated blood loss: none. Procedure:                Pre-Anesthesia Assessment:                           - Prior to the procedure, a History and Physical                            was performed, and patient medications and                            allergies were reviewed. The patient's tolerance of                            previous anesthesia was also reviewed. The risks                            and benefits of the procedure and the sedation                            options and risks were discussed with the patient.                            All questions were answered, and informed consent                            was obtained. Prior Anticoagulants: The patient has                            taken no previous anticoagulant or antiplatelet                            agents. ASA Grade Assessment: II - A patient with                            mild systemic disease. After reviewing the risks  and benefits, the patient was deemed in                            satisfactory condition to undergo the procedure.                           After obtaining informed consent, the endoscope was                            passed under direct vision. Throughout the                            procedure, the patient's blood pressure, pulse, and                            oxygen saturations were monitored  continuously. The                            [id number] scope was introduced through the                            mouth, and advanced to the duodenal bulb. The                            [employer] single use                            duodenoscope was introduced through the mouth, and                            advanced to the third part of duodenum. The upper                            GI endoscopy was accomplished without difficulty.                            The patient tolerated the procedure well. Scope In: 12:38:39 PM Scope Out: 1:57:01 PM Total Procedure Duration: 1 hour 18 minutes 22 seconds  Findings:      The hypopharynx was normal.      The gastroesophageal junction was normal.      The Z-line was regular and was found 41 cm from the incisors.      A 2 cm hiatal hernia was present.      Multiple diminutive sessile polyps with no bleeding and no stigmata of       recent bleeding were found in the gastric fundus.      The exam of the stomach was otherwise normal.      The duodenal bulb was normal.      Red blood was found in the second portion of the duodenum. Bleeding       lesion could not be identified and no hemostasis noted after injecting       1:10000 epinephrine.      The major papilla and third portion of the duodenum were normal. Impression:               -  Normal hypopharynx.                           - Normal gastroesophageal junction.                           - Z-line regular, 41 cm from the incisors.                           - 2 cm hiatal hernia.                           - Multiple gastric polyps.                           - Normal duodenal bulb.                           - Blood in the second portion of the duodenum;                            Dieulafoy suspected. Use of duodenoscope did not                            help in localizing bleeding site.                           - Normal major papilla and third portion of the                             duodenum.                           - No specimens collected. Moderate Sedation:      Per Anesthesia Care Recommendation:           - Arrange for visceral angiography with                            embolization of GDA or feeding vessel.                           - Discussed with Dr. Ronny Bacon. Procedure Code(s):        --- Professional ---                           [phone], Esophagogastroduodenoscopy, flexible,                            transoral; diagnostic, including collection of                            specimen(s) by brushing or washing, when performed                            (separate procedure) Diagnosis Code(s):        --- Professional ---  K44.9, Diaphragmatic hernia without obstruction or                            gangrene                           K31.7, Polyp of stomach and duodenum                           K92.2, Gastrointestinal hemorrhage, unspecified                           K92.1, Melena (includes Hematochezia) CPT copyright 2019 American Medical Association. All rights reserved. The codes documented in this report are preliminary and upon coder review may  be revised to meet current compliance requirements. Hildred Laser, MD Hildred Laser, MD 12/05/2020 2:42:05 PM This report has been signed electronically. Number of Addenda: 0

## 2020-12-05 NOTE — Anesthesia Postprocedure Evaluation (Signed)
Anesthesia Post Note  Patient: Stacey Reyes  Procedure(s) Performed: ESOPHAGOGASTRODUODENOSCOPY (EGD) WITH PROPOFOL  Patient location during evaluation: Phase II Anesthesia Type: General Level of consciousness: awake Pain management: pain level controlled Vital Signs Assessment: post-procedure vital signs reviewed and stable Respiratory status: spontaneous breathing and respiratory function stable Cardiovascular status: blood pressure returned to baseline and stable Postop Assessment: no headache and no apparent nausea or vomiting Anesthetic complications: no Comments: Late entry   No notable events documented.   Last Vitals:  Vitals:   12/05/20 1515 12/05/20 1530  BP: 136/60   Pulse: 73 78  Resp: 18 15  Temp:    SpO2: 98% 98%    Last Pain:  Vitals:   12/05/20 1500  TempSrc:   PainSc: 0-No pain                 Louann Sjogren

## 2020-12-05 NOTE — Plan of Care (Signed)

## 2020-12-05 NOTE — Sedation Documentation (Signed)
Pt speaking with Dr Pascal Lux at this time. Pt is awake and alert, arrived from [facility] with [facility].

## 2020-12-05 NOTE — Sedation Documentation (Signed)
Patient transferred to carelink stretcher, heading back to [facility] at this time. Report given to receiving crew. Groin site assessed. No changes from previous entry. Pulses intact distally

## 2020-12-05 NOTE — Progress Notes (Signed)
Subjective:  Patient has no complaints.  She states she has not had a bowel movement since yesterday morning.  She states she noted specks of dark stool last week Monday.  Gradually the stool started turning black and eventually was pasty black stool.  She did experience lower abdominal pain.  She here few times but did not vomit.  She denies shortness of breath.  Her appetite is good.  She does not take anticoagulants or aspirin.  She is on vitamin D.  Current Medications:  Current Facility-Administered Medications:    [MAR Hold] 0.9 %  sodium chloride infusion, 250 mL, Intravenous, PRN, Manuella Ghazi, Pratik D, DO   0.9 %  sodium chloride infusion, , Intravenous, Continuous, Annitta Needs, NP   [MAR Hold] acetaminophen (TYLENOL) tablet 650 mg, 650 mg, Oral, Q6H PRN, 650 mg at 12/05/20 0018 **OR** [MAR Hold] acetaminophen (TYLENOL) suppository 650 mg, 650 mg, Rectal, Q6H PRN, Manuella Ghazi, Pratik D, DO   [MAR Hold] bisoprolol (ZEBETA) tablet 2.5 mg, 2.5 mg, Oral, Daily, Manuella Ghazi, Pratik D, DO   lactated ringers infusion, , Intravenous, Continuous, Kiel, Coralie Keens, MD, Last Rate: 10 mL/hr at 12/05/20 1111, Continued from Pre-op at 12/05/20 1111   [MAR Hold] melatonin tablet 6 mg, 6 mg, Oral, QHS, Adefeso, Oladapo, DO, 6 mg at 12/05/20 0018   [MAR Hold] ondansetron (ZOFRAN) tablet 4 mg, 4 mg, Oral, Q6H PRN **OR** [MAR Hold] ondansetron (ZOFRAN) injection 4 mg, 4 mg, Intravenous, Q6H PRN, Manuella Ghazi, Pratik D, DO   [MAR Hold] pantoprazole (PROTONIX) injection 40 mg, 40 mg, Intravenous, Q12H, Shah, Pratik D, DO, 40 mg at 12/05/20 0952   [facility] Hold] sodium chloride flush (NS) 0.9 % injection 3 mL, 3 mL, Intravenous, Q12H, Shah, Pratik D, DO, 3 mL at 12/05/20 0952   [MAR Hold] sodium chloride flush (NS) 0.9 % injection 3 mL, 3 mL, Intravenous, PRN, Manuella Ghazi, Pratik D, DO  Objective: Blood pressure 128/61, pulse 76, temperature 98.5 F (36.9 C), temperature source Oral, resp. rate 12, height 5' 5"  (1.651 m), weight 89.2 kg, SpO2 96  %. Patient is alert and in no acute distress. Conjunctiva is pale. Sclera is nonicteric Oropharyngeal mucosa is normal. No neck masses or thyromegaly noted. Cardiac exam with regular rhythm normal S1 and S2.  Grade 3/6 systolic murmur best heard at aortic area and left upper sternal border. Lungs are clear to auscultation. Abdomen abdomen is soft and nontender with organomegaly or masses. No LE edema or clubbing noted.  Labs/studies Results:   CBC Latest Ref Rng & Units 12/05/2020 12/04/2020 11/29/2020  WBC 4.0 - 10.5 K/uL 5.8 9.0 7.6  Hemoglobin 12.0 - 15.0 g/dL 8.3(L) 7.6(L) 11.2(L)  Hematocrit 36.0 - 46.0 % 25.4(L) 24.3(L) 34.8(L)  Platelets 150 - 400 K/uL 149(L) 205 254    CMP Latest Ref Rng & Units 12/05/2020 12/04/2020 10/25/2020  Glucose 70 - 99 mg/dL 101(H) 122(H) 135(H)  BUN 8 - 23 mg/dL 21 27(H) 7(L)  Creatinine 0.44 - 1.00 mg/dL 0.66 0.63 0.74  Sodium 135 - 145 mmol/L 136 137 138  Potassium 3.5 - 5.1 mmol/L 3.6 3.6 3.9  Chloride 98 - 111 mmol/L 107 107 104  CO2 22 - 32 mmol/L 24 24 20   Calcium 8.9 - 10.3 mg/dL 7.8(L) 8.6(L) 8.5(L)  Total Protein 6.5 - 8.1 g/dL - 5.5(L) -  Total Bilirubin 0.3 - 1.2 mg/dL - 0.5 -  Alkaline Phos 38 - 126 U/L - 25(L) -  AST 15 - 41 U/L - 24 -  ALT 0 -  44 U/L - 18 -    Hepatic Function Latest Ref Rng & Units 12/04/2020 10/20/2020 10/19/2020  Total Protein 6.5 - 8.1 g/dL 5.5(L) 5.0(L) 5.7(L)  Albumin 3.5 - 5.0 g/dL 3.6 3.1(L) 3.6  AST 15 - 41 U/L 24 32 35  ALT 0 - 44 U/L 18 30 33  Alk Phosphatase 38 - 126 U/L 25(L) 21(L) 28(L)  Total Bilirubin 0.3 - 1.2 mg/dL 0.5 0.8 0.4      Assessment:  #1.  Recurrent GI bleed.  First episode occurred in August 2020.  She has undergone multiple studies including EGDs colonoscopy and 3 small bowel given capsule study.  She had EGD at this facility on 10/20/2020 and small bowel given capsule study 2 days later and bleeding source was not found.  Small given capsule study November 2021 suggesting small bowel AV  malformations.  Suspected as a source of bleeding. Patient has received 1 unit of PRBCs her hemoglobin is up from 7.6 to 8.3 g. She also has been receiving iron infusions periodically.   Plan:  Esophagogastroduodenoscopy followed by push enteroscopy.  I have reviewed the procedure risk with patient in detail and she is agreeable.

## 2020-12-05 NOTE — Progress Notes (Signed)
IR note appreciated. Patient has no complaints. Her vital signs are normal. Will start patient on clear liquids and check H&H now.

## 2020-12-05 NOTE — ED Notes (Signed)
Pt placed on cardiac monitor with BP to set cycle every 60 minutes. Continuous pulse oximeter applied.

## 2020-12-06 ENCOUNTER — Encounter (HOSPITAL_COMMUNITY): Payer: Self-pay | Admitting: Internal Medicine

## 2020-12-06 DIAGNOSIS — D62 Acute posthemorrhagic anemia: Secondary | ICD-10-CM

## 2020-12-06 LAB — CBC
HCT: 23.2 % — ABNORMAL LOW (ref 36.0–46.0)
Hemoglobin: 7.5 g/dL — ABNORMAL LOW (ref 12.0–15.0)
MCH: 31.4 pg (ref 26.0–34.0)
MCHC: 32.3 g/dL (ref 30.0–36.0)
MCV: 97.1 fL (ref 80.0–100.0)
Platelets: 165 10*3/uL (ref 150–400)
RBC: 2.39 MIL/uL — ABNORMAL LOW (ref 3.87–5.11)
RDW: 15 % (ref 11.5–15.5)
WBC: 8.3 10*3/uL (ref 4.0–10.5)
nRBC: 0 % (ref 0.0–0.2)

## 2020-12-06 LAB — BASIC METABOLIC PANEL
Anion gap: 5 (ref 5–15)
BUN: 19 mg/dL (ref 8–23)
CO2: 26 mmol/L (ref 22–32)
Calcium: 8.2 mg/dL — ABNORMAL LOW (ref 8.9–10.3)
Chloride: 106 mmol/L (ref 98–111)
Creatinine, Ser: 0.62 mg/dL (ref 0.44–1.00)
GFR, Estimated: >60 mL/min (ref >60)
Glucose, Bld: 91 mg/dL (ref 70–99)
Potassium: 3.5 mmol/L (ref 3.5–5.1)
Sodium: 137 mmol/L (ref 135–145)

## 2020-12-06 LAB — MAGNESIUM: Magnesium: 1.8 mg/dL (ref 1.7–2.4)

## 2020-12-06 LAB — HEMOGLOBIN AND HEMATOCRIT, BLOOD
HCT: 22 % — ABNORMAL LOW (ref 36.0–46.0)
HCT: 23 % — ABNORMAL LOW (ref 36.0–46.0)
Hemoglobin: 7 g/dL — ABNORMAL LOW (ref 12.0–15.0)
Hemoglobin: 7.5 g/dL — ABNORMAL LOW (ref 12.0–15.0)

## 2020-12-06 LAB — MRSA NEXT GEN BY PCR, NASAL: MRSA by PCR Next Gen: NOT DETECTED

## 2020-12-06 MED ORDER — MELATONIN 3 MG PO TABS: 6.0000 mg | ORAL_TABLET | Freq: Every day | ORAL | Status: DC

## 2020-12-06 MED ORDER — MELATONIN 3 MG PO TABS
6.0000 mg | ORAL_TABLET | Freq: Every day | ORAL | Status: DC
  Administered 2020-12-06: 6 mg via ORAL
  Filled 2020-12-06: qty 2

## 2020-12-06 MED ORDER — DIPHENHYDRAMINE HCL 25 MG PO CAPS
25.0000 mg | ORAL_CAPSULE | Freq: Once | ORAL | Status: AC
  Administered 2020-12-06: 25 mg via ORAL
  Filled 2020-12-06: qty 1

## 2020-12-06 MED ORDER — SODIUM CHLORIDE 0.9% IV SOLUTION
Freq: Once | INTRAVENOUS | Status: AC
Start: 2020-12-06 — End: 2020-12-06

## 2020-12-06 MED ORDER — ACETAMINOPHEN 325 MG PO TABS
650.0000 mg | ORAL_TABLET | Freq: Once | ORAL | Status: AC
  Administered 2020-12-06: 650 mg via ORAL
  Filled 2020-12-06: qty 2

## 2020-12-06 MED ORDER — MAGNESIUM SULFATE 2 GM/50ML IV SOLN
2.0000 g | Freq: Once | INTRAVENOUS | Status: AC
  Administered 2020-12-06: 2 g via INTRAVENOUS
  Filled 2020-12-06: qty 50

## 2020-12-06 MED ORDER — POTASSIUM CHLORIDE CRYS ER 20 MEQ PO TBCR
40.0000 meq | EXTENDED_RELEASE_TABLET | Freq: Once | ORAL | Status: AC
  Administered 2020-12-06: 40 meq via ORAL
  Filled 2020-12-06: qty 2

## 2020-12-06 NOTE — Progress Notes (Signed)
Subjective:  Feels better. BM earlier today black. Walking in unit without difficulty. No n/v. No abdominal pain.  Objective: Vital signs in last 24 hours: Temp:  [97.9 F (36.6 C)-98.7 F (37.1 C)] 98.2 F (36.8 C) (09/14 1124) Pulse Rate:  [67-98] 72 (09/14 1124) Resp:  [9-29] 16 (09/14 1124) BP: (82-190)/(31-140) 117/60 (09/14 0600) SpO2:  [93 %-100 %] 99 % (09/14 1124) Weight:  [91.6 kg] 91.6 kg (09/14 0500) Last BM Date: 12/04/20 General:   Alert,  Well-developed, well-nourished, pleasant and cooperative in NAD Head:  Normocephalic and atraumatic. Eyes:  Sclera clear, no icterus.  Abdomen:  Soft, nontender and nondistended.  Normal bowel sounds, without guarding, and without rebound.   Extremities:  Without clubbing, deformity or edema. Neurologic:  Alert and  oriented x4;  grossly normal neurologically. Skin:  Intact without significant lesions or rashes. Psych:  Alert and cooperative. Normal mood and affect.  Intake/Output from previous day: 09/13 0701 - 09/14 0700 In: 1000 [P.O.:100; I.V.:900] Out: -  Intake/Output this shift: No intake/output data recorded.  Lab Results: CBC Recent Labs    12/04/20 1223 12/05/20 0449 12/05/20 2132 12/06/20 0438  WBC 9.0 5.8  --  8.3  HGB 7.6* 8.3* 7.8* 7.5*  HCT 24.3* 25.4* 23.6* 23.2*  MCV 99.2 95.5  --  97.1  PLT 205 149*  --  165   BMET Recent Labs    12/04/20 1223 12/05/20 0449 12/06/20 0438  NA 137 136 137  K 3.6 3.6 3.5  CL 107 107 106  CO2 '24 24 26  '$ GLUCOSE 122* 101* 91  BUN 27* 21 19  CREATININE 0.63 0.66 0.62  CALCIUM 8.6* 7.8* 8.2*   LFTs Recent Labs    12/04/20 1223  BILITOT 0.5  ALKPHOS 25*  AST 24  ALT 18  PROT 5.5*  ALBUMIN 3.6   No results for input(s): LIPASE in the last 72 hours. PT/INR No results for input(s): LABPROT, INR in the last 72 hours.    Imaging Studies: DG Chest Port 1 View  Result Date: 12/04/2020 CLINICAL DATA:  Shortness of breath EXAM: PORTABLE CHEST 1 VIEW  COMPARISON:  Chest x-ray dated October 19, 2020 FINDINGS: Unchanged cardiomegaly. Mediastinal contours are within normal limits. Clear lungs. No pleural effusion or pneumothorax. IMPRESSION: No active disease. Electronically Signed   By: Yetta Glassman M.D.   On: 12/04/2020 14:02   ECHOCARDIOGRAM COMPLETE  Result Date: 11/20/2020    ECHOCARDIOGRAM REPORT   Patient Name:   Stacey Reyes Date of Exam: 11/20/2020 Medical Rec #:  [id number]         Height:       61.0 in Accession #:    [id number]        Weight:       199.0 lb Date of Birth:  06/20/1945         BSA:          1.885 m Patient Age:    3 years          BP:           123/49 mmHg Patient Gender: F                 HR:           51 bpm. Exam Location:  [address] Procedure: 2D Echo, Cardiac Doppler, Color Doppler and Intracardiac            Opacification Agent Indications:    I42.1 HOCM  History:  Patient has prior history of Echocardiogram examinations, most                 recent 03/24/2019. Risk Factors:Dyslipidemia and Hypertension.                 HOCM. Murmur.  Sonographer:    Wilford Sports Rodgers-Jones RDCS Referring Phys: [place]  1. Hypertrophic cardiomyopathy. Systolic anterior motion of the mitral valve is noted. Significant intracavitary gradient noted. Peak velocity 5.1 m/s. Peak gradient 104.2 mmHg. Left ventricular ejection fraction, by estimation, is >75%. The left ventricle has hyperdynamic function. The left ventricle has no regional wall motion abnormalities. There is severe concentric left ventricular hypertrophy. Left ventricular diastolic parameters are consistent with Grade I diastolic dysfunction (impaired relaxation). Elevated left ventricular end-diastolic pressure.  2. Right ventricular systolic function is normal. The right ventricular size is normal.  3. Left atrial size was severely dilated.  4. The mitral valve is normal in structure. Mild mitral valve regurgitation. No evidence of mitral  stenosis.  5. The aortic valve is calcified. There is mild calcification of the aortic valve. There is mild thickening of the aortic valve. Aortic valve regurgitation is not visualized. Mild aortic valve stenosis. Aortic valve mean gradient measures 14.2 mmHg. Aortic valve Vmax measures 2.59 m/s.  6. Aortic dilatation noted. There is mild dilatation of the ascending aorta, measuring 37 mm.  7. The inferior vena cava is normal in size with greater than 50% respiratory variability, suggesting right atrial pressure of 3 mmHg. Conclusion(s)/Recommendation(s): Findings consistent with hypertrophic cardiomyopathy. FINDINGS  Left Ventricle: Hypertrophic cardiomyopathy. Systolic anterior motion of the mitral valve is noted. Significant intracavitary gradient noted. Peak velocity 5.1 m/s. Peak gradient 104.2 mmHg. Left ventricular ejection fraction, by estimation, is >75%. The left ventricle has hyperdynamic function. The left ventricle has no regional wall motion abnormalities. Definity contrast agent was given IV to delineate the left ventricular endocardial borders. The left ventricular internal cavity size was normal in size. There is severe concentric left ventricular hypertrophy. Left ventricular diastolic parameters are consistent with Grade I diastolic dysfunction (impaired relaxation). Elevated left ventricular end-diastolic pressure. Right Ventricle: The right ventricular size is normal. No increase in right ventricular wall thickness. Right ventricular systolic function is normal. Left Atrium: Left atrial size was severely dilated. Right Atrium: Right atrial size was normal in size. Pericardium: Trivial pericardial effusion is present. Mitral Valve: The mitral valve is normal in structure. Mild mitral valve regurgitation. No evidence of mitral valve stenosis. Tricuspid Valve: The tricuspid valve is normal in structure. Tricuspid valve regurgitation is trivial. No evidence of tricuspid stenosis. Aortic Valve: The  aortic valve is calcified. There is mild calcification of the aortic valve. There is mild thickening of the aortic valve. Aortic valve regurgitation is not visualized. Mild aortic stenosis is present. Aortic valve mean gradient measures  14.2 mmHg. Aortic valve peak gradient measures 26.9 mmHg. Pulmonic Valve: The pulmonic valve was normal in structure. Pulmonic valve regurgitation is not visualized. No evidence of pulmonic stenosis. Aorta: Aortic dilatation noted. There is mild dilatation of the ascending aorta, measuring 37 mm. Venous: The inferior vena cava is normal in size with greater than 50% respiratory variability, suggesting right atrial pressure of 3 mmHg. IAS/Shunts: No atrial level shunt detected by color flow Doppler.  LEFT VENTRICLE PLAX 2D LVIDd:         3.60 cm  Diastology LVIDs:         1.30 cm  LV e' medial:  4.03 cm/s LV PW:         2.30 cm  LV E/e' medial:  22.8 LV IVS:        1.90 cm  LV e' lateral:   6.31 cm/s LVOT diam:     2.10 cm  LV E/e' lateral: 14.6 LVOT Area:     3.46 cm  RIGHT VENTRICLE             IVC RV Basal diam:  4.10 cm     IVC diam: 1.30 cm RV S prime:     15.20 cm/s TAPSE (M-mode): 3.7 cm LEFT ATRIUM             Index       RIGHT ATRIUM           Index LA diam:        4.20 cm 2.23 cm/m  RA Area:     11.50 cm LA Vol (A2C):   96.2 ml 51.04 ml/m RA Volume:   27.70 ml  14.70 ml/m LA Vol (A4C):   74.0 ml 39.26 ml/m LA Biplane Vol: 90.4 ml 47.96 ml/m  AORTIC VALVE AV Vmax:      259.26 cm/s AV Vmean:     167.087 cm/s AV VTI:       0.564 m AV Peak Grad: 26.9 mmHg AV Mean Grad: 14.2 mmHg  AORTA Ao Root diam: 3.50 cm Ao Asc diam:  3.70 cm MITRAL VALVE MV Area (PHT): 2.34 cm     SHUNTS MV Decel Time: 324 msec     Systemic Diam: 2.10 cm MV E velocity: 91.90 cm/s MV A velocity: 121.00 cm/s MV E/A ratio:  0.76 Skeet Latch MD Electronically signed by Skeet Latch MD Signature Date/Time: 11/20/2020/3:23:04 PM    Final   [2 weeks]   Assessment:  Pleasant 75 year old  female with history of IDA, in the past felt to be secondary to small bowel AVMs, undergoing extensive GI evaluation as previously noted.  Presenting with acute on chronic symptomatic anemia with need for transfusion, associated melena, heme positive stool.  Hemoglobin 7.6 on admission, down from 11.2 five days ago.  Most recent EGD July 2022 during last admission for acute GI bleeding, showed no obvious lesions.  Capsule study then completed with duodenitis but no obvious AVMs.  She had 3 total capsule studies, in November 2021 revealing AVMs in the proximal small bowel.  Colonoscopy from November 2021 with multiple adenomas, diverticulosis.  EGD yesterday with multiple gastric polyps.  Blood in the second portion of the duodenum: Dieulafoy lesion suspected.  Use of duodenoscope did not help in localizing bleeding site.  She went to [facility] yesterday evening and underwent post mesenteric arteriogram and empiric percutaneous coil embolization of the GDA and a hypertrophied pancreaticoduodenal artery.  Hemoglobin this admission 7.6--> 8.3--> 7.8-->7.5.  Last BM at 3am, melena. Given active bleeding yesterday at time of EGD, this is not surprising.   Plan: Continue IV PPI twice daily for now. Advance diet. Recheck H/H. If any decline, consider one unit of prbcs. Possible d/c tomorrow if remains stable.   Stacey Reyes. Bernarda Caffey [facility] [phone] 9/14/20222:30 PM    LOS: 2 days

## 2020-12-06 NOTE — Progress Notes (Addendum)
Spoke with Dr. Laural Golden, patient's Hgb down to 7.0. Would recommend one more unit of prbcs. Recheck H/H in morning. If continues to trickle downward, she may need relook/repeat EGD.   Laureen Ochs. Bernarda Caffey [facility] [phone] 9/14/20225:16 PM

## 2020-12-06 NOTE — Progress Notes (Signed)
MD notified of pt's 11 beat run of v tach, lab results and pt c/o of dizziness and head hurting, MD will assess pt during rounds.

## 2020-12-06 NOTE — Progress Notes (Signed)
PROGRESS NOTE    ETHELYNE ERICH  GYJ:[id number] DOB: 05-Nov-1945 DOA: 12/04/2020 PCP: [name], DO  Brief Narrative:   OPAL DINNING is a 75 y.o. female with medical history significant for hypertension, hyperlipidemia, GERD, small bowel AVMs, and chronic anemia requiring iron transfusions who presented to the ED with complaints of dark stools over the last 5 days.  She was admitted for acute on chronic blood loss anemia and has required 2 unit PRBC transfusion.  She has undergone EGD on 9/13 with bleeding Dieulafoy lesion suspected and will require transfer to [facility] for angiogram and embolization per IR today. Plan to transfuse another 2U PRBC for now.  Assessment & Plan:   Active Problems:   Acute blood loss anemia   Acute GI bleeding   Acute on chronic blood loss anemia likely due to bleeding Dieulafoy lesion or diverticulum -Improved after 2 unit PRBC transfusion on 9/12, but another 2 units ordered given active bleeding noted on EGD on 9/13 -Patient will require transfer to [facility] so that IR can do angiogram with embolization with return afterwards -continuing PPI twice daily -advance diet per GI / tolerating clears -Appreciate further IR/GI recommendations   Elevated troponin-in the setting of GI bleed -Likely due to demand ischemia, no signs of ACS noted -Continue to monitor   Hypertension/Dyslipidemia -Continue bisoprolol.  Crestor for lipid lowering   Hypertrophic cardiomyopathy -Continue bisoprolol -Follow-up with cardiology outpatient    DVT prophylaxis:SCDs Code Status: Full Family Communication: plan discussed with patient at bedside, verbalized understanding Disposition Plan: home when ok with GI team Status is: Inpatient  Remains inpatient appropriate because:Ongoing diagnostic testing needed not appropriate for outpatient work up, IV treatments appropriate due to intensity of illness or inability to take PO, and Inpatient level of  care appropriate due to severity of illness  Dispo: The patient is from: Home              Anticipated d/c is to: Home              Patient currently is not medically stable to d/c.   Difficult to place patient No   Consultants:  GI IR  Procedures:  EGD 9/13  Antimicrobials:  None   Subjective: Patient tolerated coil embolization well.  Tolerating clears diet, asking about going home.    Objective: Vitals:   12/06/20 0530 12/06/20 0600 12/06/20 0728 12/06/20 1124  BP: 125/63 117/60    Pulse: 91   72  Resp: 19 (!) 29  16  Temp:   97.9 F (36.6 C) 98.2 F (36.8 C)  TempSrc:   Oral Oral  SpO2: 96%   99%  Weight:      Height:        Intake/Output Summary (Last 24 hours) at 12/06/2020 1341 Last data filed at 12/06/2020 1200 Gross per 24 hour  Intake 1650 ml  Output --  Net 1650 ml   Filed Weights   12/04/20 1209 12/05/20 0941 12/06/20 0500  Weight: 90.7 kg 89.2 kg 91.6 kg    Examination:  General exam: awake, alert, cooperative, NAD, Appears calm and comfortable  Respiratory system: Clear to auscultation. Respiratory effort normal. Cardiovascular system: normal S1 & S2 heard.  Gastrointestinal system: Abdomen is soft, BS normal, no HSM.  Central nervous system: Alert and awake.   Extremities: No C/C/E Skin: No significant lesions seen.  Psychiatry: normal affect.   Data Reviewed: I have personally reviewed following labs and imaging studies  CBC: Recent Labs  Lab 12/04/20 1223 12/05/20 0449 12/05/20 2132 12/06/20 0438  WBC 9.0 5.8  --  8.3  HGB 7.6* 8.3* 7.8* 7.5*  HCT 24.3* 25.4* 23.6* 23.2*  MCV 99.2 95.5  --  97.1  PLT 205 149*  --  956   Basic Metabolic Panel: Recent Labs  Lab 12/04/20 1223 12/05/20 0449 12/06/20 0438  NA 137 136 137  K 3.6 3.6 3.5  CL 107 107 106  CO2 24 24 26   GLUCOSE 122* 101* 91  BUN 27* 21 19  CREATININE 0.63 0.66 0.62  CALCIUM 8.6* 7.8* 8.2*  MG  --  1.8 1.8   GFR: Estimated Creatinine Clearance: 67.9  mL/min (by C-G formula based on SCr of 0.62 mg/dL). Liver Function Tests: Recent Labs  Lab 12/04/20 1223  AST 24  ALT 18  ALKPHOS 25*  BILITOT 0.5  PROT 5.5*  ALBUMIN 3.6   No results for input(s): LIPASE, AMYLASE in the last 168 hours. No results for input(s): AMMONIA in the last 168 hours. Coagulation Profile: No results for input(s): INR, PROTIME in the last 168 hours. Cardiac Enzymes: No results for input(s): CKTOTAL, CKMB, CKMBINDEX, TROPONINI in the last 168 hours. BNP (last 3 results) Recent Labs    10/19/20 1244 10/25/20 1112  PROBNP 2,974* 1,440*   HbA1C: No results for input(s): HGBA1C in the last 72 hours. CBG: No results for input(s): GLUCAP in the last 168 hours. Lipid Profile: No results for input(s): CHOL, HDL, LDLCALC, TRIG, CHOLHDL, LDLDIRECT in the last 72 hours. Thyroid Function Tests: No results for input(s): TSH, T4TOTAL, FREET4, T3FREE, THYROIDAB in the last 72 hours. Anemia Panel: No results for input(s): VITAMINB12, FOLATE, FERRITIN, TIBC, IRON, RETICCTPCT in the last 72 hours. Sepsis Labs: No results for input(s): PROCALCITON, LATICACIDVEN in the last 168 hours.  Recent Results (from the past 240 hour(s))  SARS CORONAVIRUS 2 (TAT 6-24 HRS) Nasopharyngeal Nasopharyngeal Swab     Status: None   Collection Time: 12/04/20  2:30 PM   Specimen: Nasopharyngeal Swab  Result Value Ref Range Status   SARS Coronavirus 2 NEGATIVE NEGATIVE Final    Comment: (NOTE) SARS-CoV-2 target nucleic acids are NOT DETECTED.  The SARS-CoV-2 RNA is generally detectable in upper and lower respiratory specimens during the acute phase of infection. Negative results do not preclude SARS-CoV-2 infection, do not rule out co-infections with other pathogens, and should not be used as the sole basis for treatment or other patient management decisions. Negative results must be combined with clinical observations, patient history, and epidemiological information. The  expected result is Negative.  Fact Sheet for Patients: SugarRoll.be  Fact Sheet for Healthcare Providers: https://www.woods-mathews.com/  This test is not yet approved or cleared by the [place] FDA and  has been authorized for detection and/or diagnosis of SARS-CoV-2 by FDA under an Emergency Use Authorization (EUA). This EUA will remain  in effect (meaning this test can be used) for the duration of the COVID-19 declaration under Se ction 564(b)(1) of the Act, 21 U.S.C. section 360bbb-3(b)(1), unless the authorization is terminated or revoked sooner.  Performed at [facility], [address] [place], [place] [address]   MRSA Next Gen by PCR, Nasal     Status: None   Collection Time: 12/05/20  8:37 PM   Specimen: Nasal Mucosa; Nasal Swab  Result Value Ref Range Status   MRSA by PCR Next Gen NOT DETECTED NOT DETECTED Final    Comment: (NOTE) The GeneXpert MRSA Assay (FDA approved for NASAL specimens  only), is one component of a comprehensive MRSA colonization surveillance program. It is not intended to diagnose MRSA infection nor to guide or monitor treatment for MRSA infections. Test performance is not FDA approved in patients less than 7 years old. Performed at [facility], [address] [place], [place] [address]          Radiology Studies: IR Angiogram Visceral Selective  Result Date: 12/06/2020 INDICATION: History of chronic intermittent GI bleeding with endoscopy performed earlier today demonstrating acute bleeding at the level of the duodenum. As such, patient presents for mesenteric arteriogram and embolization. EXAM: 1. ULTRASOUND GUIDANCE FOR ARTERIAL ACCESS 2. SELECTIVE CELIAC ARTERIOGRAM 3. SUB SELECTIVE COMMON HEPATIC ARTERIOGRAM 4. SUB SELECTIVE PANCREATICODUODENAL ARTERIOGRAM AND PERCUTANEOUS COIL EMBOLIZATION 5. SUB SELECTIVE GASTRODUODENAL ARTERIOGRAM AND PERCUTANEOUS COIL EMBOLIZATION COMPARISON:  CT  abdomen and pelvis-11/14/2019 MEDICATIONS: None ANESTHESIA/SEDATION: Moderate (conscious) sedation was employed during this procedure. A total of Versed 2 mg and Fentanyl 100 mcg was administered intravenously. Moderate Sedation Time: 65 minutes. The patient's level of consciousness and vital signs were monitored continuously by radiology nursing throughout the procedure under my direct supervision. CONTRAST:  70 cc Omnipaque 350 FLUOROSCOPY TIME:  20 minutes, 30 seconds (0,211 mGy) COMPLICATIONS: None immediate. PROCEDURE: Informed consent was obtained from the patient following explanation of the procedure, risks, benefits and alternatives. All questions were addressed. A time out was performed prior to the initiation of the procedure. Maximal barrier sterile technique utilized including caps, mask, sterile gowns, sterile gloves, large sterile drape, hand hygiene, and Betadine prep. The right femoral head was marked fluoroscopically. Under sterile conditions and local anesthesia, the right common femoral artery access was performed with a micropuncture needle. Under direct ultrasound guidance, the right common femoral was accessed with a micropuncture kit. An ultrasound image was saved for documentation purposes. This allowed for placement of a 5-French vascular sheath. A limited arteriogram was performed through the side arm of the sheath confirming appropriate access within the right common femoral artery. Over a Bentson wire, a Mickelson catheter was advanced the caudal aspect of the thoracic aorta where was reformed, back bled and flushed. The Mickelson catheter was then utilized to select the celiac artery selective celiac arteriogram was performed. Next, with the use of a fathom 16 microwire, a regular Renegade microcatheter was utilized to select the common hepatic artery and a selective common hepatic arteriogram was performed. The microcatheter was then utilized to select a hypertrophied  pancreaticoduodenal artery arising from the proximal aspect of the GDA and a selective pancreaticoduodenal arteriogram was performed. The microcatheter was advanced to its mid/caudal aspect of the pancreaticoduodenal artery and vessel was subsequently percutaneously coil embolized with multiple overlapping 2 mm, 3 mm and 4 mm soft and fibered interlock coils to near the vessel's origin. The microcatheter was then retracted to the level of the gastroduodenal artery and a selective gastroduodenal arteriogram was performed. The microcatheter was advanced to the distal aspect of the GDA, at the takeoff of the right gastroepiploic artery. Next, the GDA was percutaneously coil embolized with multiple overlapping 5 mm, 6 mm, 8 mm and 10 mm diameter soft and fibered interlock coils. The microcatheter was retracted to the level of the common hepatic artery and post embolization common hepatic arteriograms were performed. Images were reviewed and the procedure was terminated. All wires, catheters and sheaths were removed from the patient. Hemostasis was achieved at the right groin access site with deployment of an ExoSeal closure device and manual compression. The patient tolerated the procedure  well without immediate post procedural complication. FINDINGS: Selective celiac arteriogram demonstrates conventional takeoff of the GDA however note is made of a prominent pancreaticoduodenal artery which arises from the proximal GDA. No discrete areas contrast extravasation or vessel irregularity are identified angiographically. Given endoscopic confirmation duodenal bleeding, the decision was made to proceed with prophylactic embolization. Selective pancreaticoduodenal arteriogram confirms the vessel contribute multiple distal tributaries to the descending portion of the duodenum and as such the vessel was successfully percutaneously coil embolized with multiple overlapping coils. Selective gastroduodenal arteriogram demonstrates  conventional branching pattern, terminating in a right gastroepiploic artery. The gastroduodenal artery was then successfully percutaneously embolized with multiple overlapping coils to near the origin of the GDA. Completion common hepatic arteriogram demonstrates a technically excellent result with no flow into either the GDA or pancreaticoduodenal arteries. IMPRESSION: Technically successful prophylactic percutaneous coil embolization of the GDA and the pancreaticoduodenal artery for endoscopically confirmed acute upper GI/duodenal bleeding. PLAN: - The patient is to remain flat for 4 hours with right leg straight. - The patient will continue to experience several additional bloody bowel movements and may continue to require additional resuscitation (as she was bleeding both before the procedure and the endoscopy unit), however ultimately I am hopeful she will stabilize in the coming days muted rest of resuscitation - Repeat endoscopy may be performed at the discretion of the GI service as indicated. Electronically Signed   By: Sandi Mariscal M.D.   On: 12/06/2020 13:28   IR Angiogram Selective Each Additional Vessel  Result Date: 12/06/2020 INDICATION: History of chronic intermittent GI bleeding with endoscopy performed earlier today demonstrating acute bleeding at the level of the duodenum. As such, patient presents for mesenteric arteriogram and embolization. EXAM: 1. ULTRASOUND GUIDANCE FOR ARTERIAL ACCESS 2. SELECTIVE CELIAC ARTERIOGRAM 3. SUB SELECTIVE COMMON HEPATIC ARTERIOGRAM 4. SUB SELECTIVE PANCREATICODUODENAL ARTERIOGRAM AND PERCUTANEOUS COIL EMBOLIZATION 5. SUB SELECTIVE GASTRODUODENAL ARTERIOGRAM AND PERCUTANEOUS COIL EMBOLIZATION COMPARISON:  CT abdomen and pelvis-11/14/2019 MEDICATIONS: None ANESTHESIA/SEDATION: Moderate (conscious) sedation was employed during this procedure. A total of Versed 2 mg and Fentanyl 100 mcg was administered intravenously. Moderate Sedation Time: 65 minutes. The  patient's level of consciousness and vital signs were monitored continuously by radiology nursing throughout the procedure under my direct supervision. CONTRAST:  70 cc Omnipaque 350 FLUOROSCOPY TIME:  20 minutes, 30 seconds (5,681 mGy) COMPLICATIONS: None immediate. PROCEDURE: Informed consent was obtained from the patient following explanation of the procedure, risks, benefits and alternatives. All questions were addressed. A time out was performed prior to the initiation of the procedure. Maximal barrier sterile technique utilized including caps, mask, sterile gowns, sterile gloves, large sterile drape, hand hygiene, and Betadine prep. The right femoral head was marked fluoroscopically. Under sterile conditions and local anesthesia, the right common femoral artery access was performed with a micropuncture needle. Under direct ultrasound guidance, the right common femoral was accessed with a micropuncture kit. An ultrasound image was saved for documentation purposes. This allowed for placement of a 5-French vascular sheath. A limited arteriogram was performed through the side arm of the sheath confirming appropriate access within the right common femoral artery. Over a Bentson wire, a Mickelson catheter was advanced the caudal aspect of the thoracic aorta where was reformed, back bled and flushed. The Mickelson catheter was then utilized to select the celiac artery selective celiac arteriogram was performed. Next, with the use of a fathom 16 microwire, a regular Renegade microcatheter was utilized to select the common hepatic artery and a selective common hepatic arteriogram was  performed. The microcatheter was then utilized to select a hypertrophied pancreaticoduodenal artery arising from the proximal aspect of the GDA and a selective pancreaticoduodenal arteriogram was performed. The microcatheter was advanced to its mid/caudal aspect of the pancreaticoduodenal artery and vessel was subsequently percutaneously  coil embolized with multiple overlapping 2 mm, 3 mm and 4 mm soft and fibered interlock coils to near the vessel's origin. The microcatheter was then retracted to the level of the gastroduodenal artery and a selective gastroduodenal arteriogram was performed. The microcatheter was advanced to the distal aspect of the GDA, at the takeoff of the right gastroepiploic artery. Next, the GDA was percutaneously coil embolized with multiple overlapping 5 mm, 6 mm, 8 mm and 10 mm diameter soft and fibered interlock coils. The microcatheter was retracted to the level of the common hepatic artery and post embolization common hepatic arteriograms were performed. Images were reviewed and the procedure was terminated. All wires, catheters and sheaths were removed from the patient. Hemostasis was achieved at the right groin access site with deployment of an ExoSeal closure device and manual compression. The patient tolerated the procedure well without immediate post procedural complication. FINDINGS: Selective celiac arteriogram demonstrates conventional takeoff of the GDA however note is made of a prominent pancreaticoduodenal artery which arises from the proximal GDA. No discrete areas contrast extravasation or vessel irregularity are identified angiographically. Given endoscopic confirmation duodenal bleeding, the decision was made to proceed with prophylactic embolization. Selective pancreaticoduodenal arteriogram confirms the vessel contribute multiple distal tributaries to the descending portion of the duodenum and as such the vessel was successfully percutaneously coil embolized with multiple overlapping coils. Selective gastroduodenal arteriogram demonstrates conventional branching pattern, terminating in a right gastroepiploic artery. The gastroduodenal artery was then successfully percutaneously embolized with multiple overlapping coils to near the origin of the GDA. Completion common hepatic arteriogram demonstrates a  technically excellent result with no flow into either the GDA or pancreaticoduodenal arteries. IMPRESSION: Technically successful prophylactic percutaneous coil embolization of the GDA and the pancreaticoduodenal artery for endoscopically confirmed acute upper GI/duodenal bleeding. PLAN: - The patient is to remain flat for 4 hours with right leg straight. - The patient will continue to experience several additional bloody bowel movements and may continue to require additional resuscitation (as she was bleeding both before the procedure and the endoscopy unit), however ultimately I am hopeful she will stabilize in the coming days muted rest of resuscitation - Repeat endoscopy may be performed at the discretion of the GI service as indicated. Electronically Signed   By: Sandi Mariscal M.D.   On: 12/06/2020 13:28   IR Angiogram Selective Each Additional Vessel  Result Date: 12/06/2020 INDICATION: History of chronic intermittent GI bleeding with endoscopy performed earlier today demonstrating acute bleeding at the level of the duodenum. As such, patient presents for mesenteric arteriogram and embolization. EXAM: 1. ULTRASOUND GUIDANCE FOR ARTERIAL ACCESS 2. SELECTIVE CELIAC ARTERIOGRAM 3. SUB SELECTIVE COMMON HEPATIC ARTERIOGRAM 4. SUB SELECTIVE PANCREATICODUODENAL ARTERIOGRAM AND PERCUTANEOUS COIL EMBOLIZATION 5. SUB SELECTIVE GASTRODUODENAL ARTERIOGRAM AND PERCUTANEOUS COIL EMBOLIZATION COMPARISON:  CT abdomen and pelvis-11/14/2019 MEDICATIONS: None ANESTHESIA/SEDATION: Moderate (conscious) sedation was employed during this procedure. A total of Versed 2 mg and Fentanyl 100 mcg was administered intravenously. Moderate Sedation Time: 65 minutes. The patient's level of consciousness and vital signs were monitored continuously by radiology nursing throughout the procedure under my direct supervision. CONTRAST:  70 cc Omnipaque 350 FLUOROSCOPY TIME:  20 minutes, 30 seconds (4,098 mGy) COMPLICATIONS: None immediate.  PROCEDURE: Informed consent was obtained  from the patient following explanation of the procedure, risks, benefits and alternatives. All questions were addressed. A time out was performed prior to the initiation of the procedure. Maximal barrier sterile technique utilized including caps, mask, sterile gowns, sterile gloves, large sterile drape, hand hygiene, and Betadine prep. The right femoral head was marked fluoroscopically. Under sterile conditions and local anesthesia, the right common femoral artery access was performed with a micropuncture needle. Under direct ultrasound guidance, the right common femoral was accessed with a micropuncture kit. An ultrasound image was saved for documentation purposes. This allowed for placement of a 5-French vascular sheath. A limited arteriogram was performed through the side arm of the sheath confirming appropriate access within the right common femoral artery. Over a Bentson wire, a Mickelson catheter was advanced the caudal aspect of the thoracic aorta where was reformed, back bled and flushed. The Mickelson catheter was then utilized to select the celiac artery selective celiac arteriogram was performed. Next, with the use of a fathom 16 microwire, a regular Renegade microcatheter was utilized to select the common hepatic artery and a selective common hepatic arteriogram was performed. The microcatheter was then utilized to select a hypertrophied pancreaticoduodenal artery arising from the proximal aspect of the GDA and a selective pancreaticoduodenal arteriogram was performed. The microcatheter was advanced to its mid/caudal aspect of the pancreaticoduodenal artery and vessel was subsequently percutaneously coil embolized with multiple overlapping 2 mm, 3 mm and 4 mm soft and fibered interlock coils to near the vessel's origin. The microcatheter was then retracted to the level of the gastroduodenal artery and a selective gastroduodenal arteriogram was performed. The  microcatheter was advanced to the distal aspect of the GDA, at the takeoff of the right gastroepiploic artery. Next, the GDA was percutaneously coil embolized with multiple overlapping 5 mm, 6 mm, 8 mm and 10 mm diameter soft and fibered interlock coils. The microcatheter was retracted to the level of the common hepatic artery and post embolization common hepatic arteriograms were performed. Images were reviewed and the procedure was terminated. All wires, catheters and sheaths were removed from the patient. Hemostasis was achieved at the right groin access site with deployment of an ExoSeal closure device and manual compression. The patient tolerated the procedure well without immediate post procedural complication. FINDINGS: Selective celiac arteriogram demonstrates conventional takeoff of the GDA however note is made of a prominent pancreaticoduodenal artery which arises from the proximal GDA. No discrete areas contrast extravasation or vessel irregularity are identified angiographically. Given endoscopic confirmation duodenal bleeding, the decision was made to proceed with prophylactic embolization. Selective pancreaticoduodenal arteriogram confirms the vessel contribute multiple distal tributaries to the descending portion of the duodenum and as such the vessel was successfully percutaneously coil embolized with multiple overlapping coils. Selective gastroduodenal arteriogram demonstrates conventional branching pattern, terminating in a right gastroepiploic artery. The gastroduodenal artery was then successfully percutaneously embolized with multiple overlapping coils to near the origin of the GDA. Completion common hepatic arteriogram demonstrates a technically excellent result with no flow into either the GDA or pancreaticoduodenal arteries. IMPRESSION: Technically successful prophylactic percutaneous coil embolization of the GDA and the pancreaticoduodenal artery for endoscopically confirmed acute upper  GI/duodenal bleeding. PLAN: - The patient is to remain flat for 4 hours with right leg straight. - The patient will continue to experience several additional bloody bowel movements and may continue to require additional resuscitation (as she was bleeding both before the procedure and the endoscopy unit), however ultimately I am hopeful she will stabilize in the  coming days muted rest of resuscitation - Repeat endoscopy may be performed at the discretion of the GI service as indicated. Electronically Signed   By: Sandi Mariscal M.D.   On: 12/06/2020 13:28   IR Angiogram Selective Each Additional Vessel  Result Date: 12/06/2020 INDICATION: History of chronic intermittent GI bleeding with endoscopy performed earlier today demonstrating acute bleeding at the level of the duodenum. As such, patient presents for mesenteric arteriogram and embolization. EXAM: 1. ULTRASOUND GUIDANCE FOR ARTERIAL ACCESS 2. SELECTIVE CELIAC ARTERIOGRAM 3. SUB SELECTIVE COMMON HEPATIC ARTERIOGRAM 4. SUB SELECTIVE PANCREATICODUODENAL ARTERIOGRAM AND PERCUTANEOUS COIL EMBOLIZATION 5. SUB SELECTIVE GASTRODUODENAL ARTERIOGRAM AND PERCUTANEOUS COIL EMBOLIZATION COMPARISON:  CT abdomen and pelvis-11/14/2019 MEDICATIONS: None ANESTHESIA/SEDATION: Moderate (conscious) sedation was employed during this procedure. A total of Versed 2 mg and Fentanyl 100 mcg was administered intravenously. Moderate Sedation Time: 65 minutes. The patient's level of consciousness and vital signs were monitored continuously by radiology nursing throughout the procedure under my direct supervision. CONTRAST:  70 cc Omnipaque 350 FLUOROSCOPY TIME:  20 minutes, 30 seconds (4,098 mGy) COMPLICATIONS: None immediate. PROCEDURE: Informed consent was obtained from the patient following explanation of the procedure, risks, benefits and alternatives. All questions were addressed. A time out was performed prior to the initiation of the procedure. Maximal barrier sterile technique  utilized including caps, mask, sterile gowns, sterile gloves, large sterile drape, hand hygiene, and Betadine prep. The right femoral head was marked fluoroscopically. Under sterile conditions and local anesthesia, the right common femoral artery access was performed with a micropuncture needle. Under direct ultrasound guidance, the right common femoral was accessed with a micropuncture kit. An ultrasound image was saved for documentation purposes. This allowed for placement of a 5-French vascular sheath. A limited arteriogram was performed through the side arm of the sheath confirming appropriate access within the right common femoral artery. Over a Bentson wire, a Mickelson catheter was advanced the caudal aspect of the thoracic aorta where was reformed, back bled and flushed. The Mickelson catheter was then utilized to select the celiac artery selective celiac arteriogram was performed. Next, with the use of a fathom 16 microwire, a regular Renegade microcatheter was utilized to select the common hepatic artery and a selective common hepatic arteriogram was performed. The microcatheter was then utilized to select a hypertrophied pancreaticoduodenal artery arising from the proximal aspect of the GDA and a selective pancreaticoduodenal arteriogram was performed. The microcatheter was advanced to its mid/caudal aspect of the pancreaticoduodenal artery and vessel was subsequently percutaneously coil embolized with multiple overlapping 2 mm, 3 mm and 4 mm soft and fibered interlock coils to near the vessel's origin. The microcatheter was then retracted to the level of the gastroduodenal artery and a selective gastroduodenal arteriogram was performed. The microcatheter was advanced to the distal aspect of the GDA, at the takeoff of the right gastroepiploic artery. Next, the GDA was percutaneously coil embolized with multiple overlapping 5 mm, 6 mm, 8 mm and 10 mm diameter soft and fibered interlock coils. The  microcatheter was retracted to the level of the common hepatic artery and post embolization common hepatic arteriograms were performed. Images were reviewed and the procedure was terminated. All wires, catheters and sheaths were removed from the patient. Hemostasis was achieved at the right groin access site with deployment of an ExoSeal closure device and manual compression. The patient tolerated the procedure well without immediate post procedural complication. FINDINGS: Selective celiac arteriogram demonstrates conventional takeoff of the GDA however note is made of a prominent pancreaticoduodenal  artery which arises from the proximal GDA. No discrete areas contrast extravasation or vessel irregularity are identified angiographically. Given endoscopic confirmation duodenal bleeding, the decision was made to proceed with prophylactic embolization. Selective pancreaticoduodenal arteriogram confirms the vessel contribute multiple distal tributaries to the descending portion of the duodenum and as such the vessel was successfully percutaneously coil embolized with multiple overlapping coils. Selective gastroduodenal arteriogram demonstrates conventional branching pattern, terminating in a right gastroepiploic artery. The gastroduodenal artery was then successfully percutaneously embolized with multiple overlapping coils to near the origin of the GDA. Completion common hepatic arteriogram demonstrates a technically excellent result with no flow into either the GDA or pancreaticoduodenal arteries. IMPRESSION: Technically successful prophylactic percutaneous coil embolization of the GDA and the pancreaticoduodenal artery for endoscopically confirmed acute upper GI/duodenal bleeding. PLAN: - The patient is to remain flat for 4 hours with right leg straight. - The patient will continue to experience several additional bloody bowel movements and may continue to require additional resuscitation (as she was bleeding both  before the procedure and the endoscopy unit), however ultimately I am hopeful she will stabilize in the coming days muted rest of resuscitation - Repeat endoscopy may be performed at the discretion of the GI service as indicated. Electronically Signed   By: Sandi Mariscal M.D.   On: 12/06/2020 13:28   IR US Guide Vasc Access Right  Result Date: 12/06/2020 INDICATION: History of chronic intermittent GI bleeding with endoscopy performed earlier today demonstrating acute bleeding at the level of the duodenum. As such, patient presents for mesenteric arteriogram and embolization. EXAM: 1. ULTRASOUND GUIDANCE FOR ARTERIAL ACCESS 2. SELECTIVE CELIAC ARTERIOGRAM 3. SUB SELECTIVE COMMON HEPATIC ARTERIOGRAM 4. SUB SELECTIVE PANCREATICODUODENAL ARTERIOGRAM AND PERCUTANEOUS COIL EMBOLIZATION 5. SUB SELECTIVE GASTRODUODENAL ARTERIOGRAM AND PERCUTANEOUS COIL EMBOLIZATION COMPARISON:  CT abdomen and pelvis-11/14/2019 MEDICATIONS: None ANESTHESIA/SEDATION: Moderate (conscious) sedation was employed during this procedure. A total of Versed 2 mg and Fentanyl 100 mcg was administered intravenously. Moderate Sedation Time: 65 minutes. The patient's level of consciousness and vital signs were monitored continuously by radiology nursing throughout the procedure under my direct supervision. CONTRAST:  70 cc Omnipaque 350 FLUOROSCOPY TIME:  20 minutes, 30 seconds (8,333 mGy) COMPLICATIONS: None immediate. PROCEDURE: Informed consent was obtained from the patient following explanation of the procedure, risks, benefits and alternatives. All questions were addressed. A time out was performed prior to the initiation of the procedure. Maximal barrier sterile technique utilized including caps, mask, sterile gowns, sterile gloves, large sterile drape, hand hygiene, and Betadine prep. The right femoral head was marked fluoroscopically. Under sterile conditions and local anesthesia, the right common femoral artery access was performed with a  micropuncture needle. Under direct ultrasound guidance, the right common femoral was accessed with a micropuncture kit. An ultrasound image was saved for documentation purposes. This allowed for placement of a 5-French vascular sheath. A limited arteriogram was performed through the side arm of the sheath confirming appropriate access within the right common femoral artery. Over a Bentson wire, a Mickelson catheter was advanced the caudal aspect of the thoracic aorta where was reformed, back bled and flushed. The Mickelson catheter was then utilized to select the celiac artery selective celiac arteriogram was performed. Next, with the use of a fathom 16 microwire, a regular Renegade microcatheter was utilized to select the common hepatic artery and a selective common hepatic arteriogram was performed. The microcatheter was then utilized to select a hypertrophied pancreaticoduodenal artery arising from the proximal aspect of the GDA and a selective pancreaticoduodenal  arteriogram was performed. The microcatheter was advanced to its mid/caudal aspect of the pancreaticoduodenal artery and vessel was subsequently percutaneously coil embolized with multiple overlapping 2 mm, 3 mm and 4 mm soft and fibered interlock coils to near the vessel's origin. The microcatheter was then retracted to the level of the gastroduodenal artery and a selective gastroduodenal arteriogram was performed. The microcatheter was advanced to the distal aspect of the GDA, at the takeoff of the right gastroepiploic artery. Next, the GDA was percutaneously coil embolized with multiple overlapping 5 mm, 6 mm, 8 mm and 10 mm diameter soft and fibered interlock coils. The microcatheter was retracted to the level of the common hepatic artery and post embolization common hepatic arteriograms were performed. Images were reviewed and the procedure was terminated. All wires, catheters and sheaths were removed from the patient. Hemostasis was achieved at  the right groin access site with deployment of an ExoSeal closure device and manual compression. The patient tolerated the procedure well without immediate post procedural complication. FINDINGS: Selective celiac arteriogram demonstrates conventional takeoff of the GDA however note is made of a prominent pancreaticoduodenal artery which arises from the proximal GDA. No discrete areas contrast extravasation or vessel irregularity are identified angiographically. Given endoscopic confirmation duodenal bleeding, the decision was made to proceed with prophylactic embolization. Selective pancreaticoduodenal arteriogram confirms the vessel contribute multiple distal tributaries to the descending portion of the duodenum and as such the vessel was successfully percutaneously coil embolized with multiple overlapping coils. Selective gastroduodenal arteriogram demonstrates conventional branching pattern, terminating in a right gastroepiploic artery. The gastroduodenal artery was then successfully percutaneously embolized with multiple overlapping coils to near the origin of the GDA. Completion common hepatic arteriogram demonstrates a technically excellent result with no flow into either the GDA or pancreaticoduodenal arteries. IMPRESSION: Technically successful prophylactic percutaneous coil embolization of the GDA and the pancreaticoduodenal artery for endoscopically confirmed acute upper GI/duodenal bleeding. PLAN: - The patient is to remain flat for 4 hours with right leg straight. - The patient will continue to experience several additional bloody bowel movements and may continue to require additional resuscitation (as she was bleeding both before the procedure and the endoscopy unit), however ultimately I am hopeful she will stabilize in the coming days muted rest of resuscitation - Repeat endoscopy may be performed at the discretion of the GI service as indicated. Electronically Signed   By: Sandi Mariscal M.D.   On:  12/06/2020 13:28   IR EMBO ART  VEN HEMORR LYMPH EXTRAV  INC GUIDE ROADMAPPING  Result Date: 12/06/2020 INDICATION: History of chronic intermittent GI bleeding with endoscopy performed earlier today demonstrating acute bleeding at the level of the duodenum. As such, patient presents for mesenteric arteriogram and embolization. EXAM: 1. ULTRASOUND GUIDANCE FOR ARTERIAL ACCESS 2. SELECTIVE CELIAC ARTERIOGRAM 3. SUB SELECTIVE COMMON HEPATIC ARTERIOGRAM 4. SUB SELECTIVE PANCREATICODUODENAL ARTERIOGRAM AND PERCUTANEOUS COIL EMBOLIZATION 5. SUB SELECTIVE GASTRODUODENAL ARTERIOGRAM AND PERCUTANEOUS COIL EMBOLIZATION COMPARISON:  CT abdomen and pelvis-11/14/2019 MEDICATIONS: None ANESTHESIA/SEDATION: Moderate (conscious) sedation was employed during this procedure. A total of Versed 2 mg and Fentanyl 100 mcg was administered intravenously. Moderate Sedation Time: 65 minutes. The patient's level of consciousness and vital signs were monitored continuously by radiology nursing throughout the procedure under my direct supervision. CONTRAST:  70 cc Omnipaque 350 FLUOROSCOPY TIME:  20 minutes, 30 seconds (5,329 mGy) COMPLICATIONS: None immediate. PROCEDURE: Informed consent was obtained from the patient following explanation of the procedure, risks, benefits and alternatives. All questions were addressed. A time out  was performed prior to the initiation of the procedure. Maximal barrier sterile technique utilized including caps, mask, sterile gowns, sterile gloves, large sterile drape, hand hygiene, and Betadine prep. The right femoral head was marked fluoroscopically. Under sterile conditions and local anesthesia, the right common femoral artery access was performed with a micropuncture needle. Under direct ultrasound guidance, the right common femoral was accessed with a micropuncture kit. An ultrasound image was saved for documentation purposes. This allowed for placement of a 5-French vascular sheath. A limited  arteriogram was performed through the side arm of the sheath confirming appropriate access within the right common femoral artery. Over a Bentson wire, a Mickelson catheter was advanced the caudal aspect of the thoracic aorta where was reformed, back bled and flushed. The Mickelson catheter was then utilized to select the celiac artery selective celiac arteriogram was performed. Next, with the use of a fathom 16 microwire, a regular Renegade microcatheter was utilized to select the common hepatic artery and a selective common hepatic arteriogram was performed. The microcatheter was then utilized to select a hypertrophied pancreaticoduodenal artery arising from the proximal aspect of the GDA and a selective pancreaticoduodenal arteriogram was performed. The microcatheter was advanced to its mid/caudal aspect of the pancreaticoduodenal artery and vessel was subsequently percutaneously coil embolized with multiple overlapping 2 mm, 3 mm and 4 mm soft and fibered interlock coils to near the vessel's origin. The microcatheter was then retracted to the level of the gastroduodenal artery and a selective gastroduodenal arteriogram was performed. The microcatheter was advanced to the distal aspect of the GDA, at the takeoff of the right gastroepiploic artery. Next, the GDA was percutaneously coil embolized with multiple overlapping 5 mm, 6 mm, 8 mm and 10 mm diameter soft and fibered interlock coils. The microcatheter was retracted to the level of the common hepatic artery and post embolization common hepatic arteriograms were performed. Images were reviewed and the procedure was terminated. All wires, catheters and sheaths were removed from the patient. Hemostasis was achieved at the right groin access site with deployment of an ExoSeal closure device and manual compression. The patient tolerated the procedure well without immediate post procedural complication. FINDINGS: Selective celiac arteriogram demonstrates  conventional takeoff of the GDA however note is made of a prominent pancreaticoduodenal artery which arises from the proximal GDA. No discrete areas contrast extravasation or vessel irregularity are identified angiographically. Given endoscopic confirmation duodenal bleeding, the decision was made to proceed with prophylactic embolization. Selective pancreaticoduodenal arteriogram confirms the vessel contribute multiple distal tributaries to the descending portion of the duodenum and as such the vessel was successfully percutaneously coil embolized with multiple overlapping coils. Selective gastroduodenal arteriogram demonstrates conventional branching pattern, terminating in a right gastroepiploic artery. The gastroduodenal artery was then successfully percutaneously embolized with multiple overlapping coils to near the origin of the GDA. Completion common hepatic arteriogram demonstrates a technically excellent result with no flow into either the GDA or pancreaticoduodenal arteries. IMPRESSION: Technically successful prophylactic percutaneous coil embolization of the GDA and the pancreaticoduodenal artery for endoscopically confirmed acute upper GI/duodenal bleeding. PLAN: - The patient is to remain flat for 4 hours with right leg straight. - The patient will continue to experience several additional bloody bowel movements and may continue to require additional resuscitation (as she was bleeding both before the procedure and the endoscopy unit), however ultimately I am hopeful she will stabilize in the coming days muted rest of resuscitation - Repeat endoscopy may be performed at the discretion of the GI service  as indicated. Electronically Signed   By: Sandi Mariscal M.D.   On: 12/06/2020 13:28     Scheduled Meds:  sodium chloride   Intravenous Once   bisoprolol  2.5 mg Oral Daily   Chlorhexidine Gluconate Cloth  6 each Topical Daily   melatonin  6 mg Oral QHS   pantoprazole (PROTONIX) IV  40 mg Intravenous  Q12H   sodium chloride flush  3 mL Intravenous Q12H   Continuous Infusions:  sodium chloride      LOS: 2 days   Time spent: 35 minutes  Dereonna Lensing Wynetta Emery, MD How to contact the [facility] Attending or Consulting provider [place] or covering provider during after hours [place], for this patient?  Check the care team in [facility] and look for a) attending/consulting [facility] provider listed and b) the [facility] team listed Log into [email or web address] and use [place]'s universal password to access. If you do not have the password, please contact the hospital operator. Locate the [facility] provider you are looking for under [facility] and page to a number that you can be directly reached. If you still have difficulty reaching the provider, please page the [facility] (Director on Call) for the Hospitalists listed on amion for assistance.  [facility]  If 7PM-7AM, please contact night-coverage [email or web address] 12/06/2020, 1:41 PM

## 2020-12-07 DIAGNOSIS — K922 Gastrointestinal hemorrhage, unspecified: Secondary | ICD-10-CM

## 2020-12-07 LAB — CBC
HCT: 23.3 % — ABNORMAL LOW (ref 36.0–46.0)
Hemoglobin: 7.6 g/dL — ABNORMAL LOW (ref 12.0–15.0)
MCH: 31.8 pg (ref 26.0–34.0)
MCHC: 32.6 g/dL (ref 30.0–36.0)
MCV: 97.5 fL (ref 80.0–100.0)
Platelets: 157 10*3/uL (ref 150–400)
RBC: 2.39 MIL/uL — ABNORMAL LOW (ref 3.87–5.11)
RDW: 15.4 % (ref 11.5–15.5)
WBC: 7.4 10*3/uL (ref 4.0–10.5)
nRBC: 0 % (ref 0.0–0.2)

## 2020-12-07 MED ORDER — PANTOPRAZOLE SODIUM 40 MG PO TBEC: DELAYED_RELEASE_TABLET | ORAL | 1 refills | Status: DC

## 2020-12-07 NOTE — Discharge Instructions (Signed)
Please have  your primary care doctor check your hemoglobin in 1 week.    Please follow up with [facility] in 2-4 weeks.     IMPORTANT INFORMATION: PAY CLOSE ATTENTION   PHYSICIAN DISCHARGE INSTRUCTIONS  Follow with Primary care provider  [name], DO  and other consultants as instructed by your Hospitalist Physician  [place] IF SYMPTOMS COME BACK, WORSEN OR NEW PROBLEM DEVELOPS   Please note: You were cared for by a hospitalist during your hospital stay. Every effort will be made to forward records to your primary care provider.  You can request that your primary care provider send for your hospital records if they have not received them.  Once you are discharged, your primary care physician will handle any further medical issues. Please note that NO REFILLS for any discharge medications will be authorized once you are discharged, as it is imperative that you return to your primary care physician (or establish a relationship with a primary care physician if you do not have one) for your post hospital discharge needs so that they can reassess your need for medications and monitor your lab values.  Please get a complete blood count and chemistry panel checked by your Primary MD at your next visit, and again as instructed by your Primary MD.  Get Medicines reviewed and adjusted: Please take all your medications with you for your next visit with your Primary MD  Laboratory/radiological data: Please request your Primary MD to go over all hospital tests and procedure/radiological results at the follow up, please ask your primary care provider to get all Hospital records sent to his/her office.  In some cases, they will be blood work, cultures and biopsy results pending at the time of your discharge. Please request that your primary care provider follow up on these results.  If you are diabetic, please bring your blood sugar readings with you to  your follow up appointment with primary care.    Please call and make your follow up appointments as soon as possible.    Also Note the following: If you experience worsening of your admission symptoms, develop shortness of breath, life threatening emergency, suicidal or homicidal thoughts you must seek medical attention immediately by calling 911 or calling your MD immediately  if symptoms less severe.  You must read complete instructions/literature along with all the possible adverse reactions/side effects for all the Medicines you take and that have been prescribed to you. Take any new Medicines after you have completely understood and accpet all the possible adverse reactions/side effects.   Do not drive when taking Pain medications or sleeping medications (Benzodiazepines)  Do not take more than prescribed Pain, Sleep and Anxiety Medications. It is not advisable to combine anxiety,sleep and pain medications without talking with your primary care practitioner  Special Instructions: If you have smoked or chewed Tobacco  in the last 2 yrs please stop smoking, stop any regular Alcohol  and or any Recreational drug use.  Wear Seat belts while driving.  Do not drive if taking any narcotic, mind altering or controlled substances or recreational drugs or alcohol.

## 2020-12-07 NOTE — Progress Notes (Signed)
Discharge paperwork and instructions provided to patient. Pt had no questions or concerns. PIV removed. Pt belongings returned. Pt transported to ED entrance where her vehicle is parked.

## 2020-12-07 NOTE — Plan of Care (Signed)
  Problem: Elimination: Goal: Will not experience complications related to urinary retention Outcome: Progressing   Problem: Pain Managment: Goal: General experience of comfort will improve Outcome: Progressing   Problem: Safety: Goal: Ability to remain free from injury will improve Outcome: Progressing   

## 2020-12-07 NOTE — Evaluation (Signed)
Physical Therapy Evaluation Patient Details Name: Stacey Reyes MRN: [id number] DOB: 17-Jan-1946 Today's Date: 12/07/2020  History of Present Illness  Stacey Reyes is a 74 y.o. female with medical history significant for hypertension, hyperlipidemia, GERD, small bowel AVMs, and chronic anemia requiring iron transfusions who presented to the ED with complaints of dark stools over the last 5 days.  She has had some shortness of breath over the last 1 week that was particularly worsened with exertion, but she denies any chest pain or pressure.  She has had some lightheadedness and dizziness over the last 1 day.  She denies any nausea, vomiting, diarrhea, fevers, or chills.  She was recently discharged on 10/22/2020 for the same and had EGD at that time that was unrevealing and underwent capsule endoscopy revealing AVMs.   Clinical Impression  Patient functioning at baseline for functional mobility and gait.  Plan:  Patient discharged from physical therapy to care of nursing for ambulation daily as tolerated for length of stay.         Recommendations for follow up therapy are one component of a multi-disciplinary discharge planning process, led by the attending physician.  Recommendations may be updated based on patient status, additional functional criteria and insurance authorization.  Follow Up Recommendations No PT follow up    Equipment Recommendations  None recommended by PT    Recommendations for Other Services       Precautions / Restrictions Precautions Precautions: None Restrictions Weight Bearing Restrictions: No      Mobility  Bed Mobility Overal bed mobility: Independent                  Transfers Overall transfer level: Independent                  Ambulation/Gait Ambulation/Gait assistance: Modified independent (Device/Increase time) Gait Distance (Feet): 100 Feet Assistive device: None Gait Pattern/deviations: WFL(Within Functional  Limits) Gait velocity: slightly decreased   General Gait Details: demonstrates good return for ambulation in room and hallways without loss of balance  Stairs            Wheelchair Mobility    Modified Rankin (Stroke Patients Only)       Balance Overall balance assessment: No apparent balance deficits (not formally assessed)                                           Pertinent Vitals/Pain Pain Assessment: No/denies pain    Home Living Family/patient expects to be discharged to:: Private residence Living Arrangements: Spouse/significant other Available Help at Discharge: Family;Available 24 hours/day Type of Home: House Home Access: Stairs to enter Entrance Stairs-Rails: None Entrance Stairs-Number of Steps: 1 Home Layout: One level Home Equipment: Environmental consultant - 2 wheels;Grab bars - tub/shower      Prior Function Level of Independence: Independent         Comments: Hydrographic surveyor, drives     Journalist, newspaper        Extremity/Trunk Assessment   Upper Extremity Assessment Upper Extremity Assessment: Overall WFL for tasks assessed    Lower Extremity Assessment Lower Extremity Assessment: Overall WFL for tasks assessed    Cervical / Trunk Assessment Cervical / Trunk Assessment: Normal  Communication   Communication: No difficulties  Cognition Arousal/Alertness: Awake/alert Behavior During Therapy: WFL for tasks assessed/performed Overall Cognitive Status: Within Functional Limits for tasks assessed  General Comments      Exercises     Assessment/Plan    PT Assessment Patent does not need any further PT services  PT Problem List         PT Treatment Interventions      PT Goals (Current goals can be found in the Care Plan section)  Acute Rehab PT Goals Patient Stated Goal: return home PT Goal Formulation: With patient Time For Goal Achievement: 12/07/20 Potential  to Achieve Goals: Good    Frequency     Barriers to discharge        Co-evaluation               AM-PAC PT "6 Clicks" Mobility  Outcome Measure Help needed turning from your back to your side while in a flat bed without using bedrails?: None Help needed moving from lying on your back to sitting on the side of a flat bed without using bedrails?: None Help needed moving to and from a bed to a chair (including a wheelchair)?: None Help needed standing up from a chair using your arms (e.g., wheelchair or bedside chair)?: None Help needed to walk in hospital room?: None Help needed climbing 3-5 steps with a railing? : None 6 Click Score: 24    End of Session   Activity Tolerance: Patient tolerated treatment well Patient left: in chair Nurse Communication: Mobility status PT Visit Diagnosis: Unsteadiness on feet (R26.81);Other abnormalities of gait and mobility (R26.89);Muscle weakness (generalized) (M62.81)    Time: [id number] PT Time Calculation (min) (ACUTE ONLY): 18 min   Charges:   PT Evaluation $PT Eval Low Complexity: 1 Low PT Treatments $Therapeutic Activity: 8-22 mins        10:13 AM, 12/07/20 Lonell Grandchild, MPT Physical Therapist with [facility] 336 [phone] office [phone] mobile phone

## 2020-12-07 NOTE — Progress Notes (Signed)
Subjective:  Small dark BM today. Denies lightheadedness, DOE. Wants to go home. No abdominal pain. Appetite good.   Objective: Vital signs in last 24 hours: Temp:  [97.5 F (36.4 C)-98.7 F (37.1 C)] 97.5 F (36.4 C) (09/15 0703) Pulse Rate:  [71-85] 71 (09/14 2346) Resp:  [13-21] 17 (09/15 0703) BP: (88-156)/(34-115) 132/47 (09/15 0410) SpO2:  [93 %-99 %] 95 % (09/15 0410) Last BM Date: 12/06/20 General:   Alert,  Well-developed, well-nourished, pleasant and cooperative in NAD Head:  Normocephalic and atraumatic. Eyes:  Sclera clear, no icterus.  Abdomen:  Soft, nontender and nondistended.  Normal bowel sounds, without guarding, and without rebound.   Extremities:  Without clubbing, deformity or edema. Neurologic:  Alert and  oriented x4;  grossly normal neurologically. Skin:  Intact without significant lesions or rashes. Psych:  Alert and cooperative. Normal mood and affect.  Intake/Output from previous day: 09/14 0701 - 09/15 0700 In: 0165 [P.O.:1080; Blood:315; IV Piggyback:50] Out: -  Intake/Output this shift: No intake/output data recorded.  Lab Results: CBC Recent Labs    12/05/20 0449 12/05/20 2132 12/06/20 0438 12/06/20 1439 12/06/20 2314 12/07/20 0437  WBC 5.8  --  8.3  --   --  7.4  HGB 8.3*   < > 7.5* 7.0* 7.5* 7.6*  HCT 25.4*   < > 23.2* 22.0* 23.0* 23.3*  MCV 95.5  --  97.1  --   --  97.5  PLT 149*  --  165  --   --  157   < > = values in this interval not displayed.   BMET Recent Labs    12/04/20 1223 12/05/20 0449 12/06/20 0438  NA 137 136 137  K 3.6 3.6 3.5  CL 107 107 106  CO2 24 24 26   GLUCOSE 122* 101* 91  BUN 27* 21 19  CREATININE 0.63 0.66 0.62  CALCIUM 8.6* 7.8* 8.2*   LFTs Recent Labs    12/04/20 1223  BILITOT 0.5  ALKPHOS 25*  AST 24  ALT 18  PROT 5.5*  ALBUMIN 3.6   No results for input(s): LIPASE in the last 72 hours. PT/INR No results for input(s): LABPROT, INR in the last 72 hours.    Imaging Studies: IR  Angiogram Visceral Selective  Result Date: 12/06/2020 INDICATION: History of chronic intermittent GI bleeding with endoscopy performed earlier today demonstrating acute bleeding at the level of the duodenum. As such, patient presents for mesenteric arteriogram and embolization. EXAM: 1. ULTRASOUND GUIDANCE FOR ARTERIAL ACCESS 2. SELECTIVE CELIAC ARTERIOGRAM 3. SUB SELECTIVE COMMON HEPATIC ARTERIOGRAM 4. SUB SELECTIVE PANCREATICODUODENAL ARTERIOGRAM AND PERCUTANEOUS COIL EMBOLIZATION 5. SUB SELECTIVE GASTRODUODENAL ARTERIOGRAM AND PERCUTANEOUS COIL EMBOLIZATION COMPARISON:  CT abdomen and pelvis-11/14/2019 MEDICATIONS: None ANESTHESIA/SEDATION: Moderate (conscious) sedation was employed during this procedure. A total of Versed 2 mg and Fentanyl 100 mcg was administered intravenously. Moderate Sedation Time: 65 minutes. The patient's level of consciousness and vital signs were monitored continuously by radiology nursing throughout the procedure under my direct supervision. CONTRAST:  70 cc Omnipaque 350 FLUOROSCOPY TIME:  20 minutes, 30 seconds (5,374 mGy) COMPLICATIONS: None immediate. PROCEDURE: Informed consent was obtained from the patient following explanation of the procedure, risks, benefits and alternatives. All questions were addressed. A time out was performed prior to the initiation of the procedure. Maximal barrier sterile technique utilized including caps, mask, sterile gowns, sterile gloves, large sterile drape, hand hygiene, and Betadine prep. The right femoral head was marked fluoroscopically. Under sterile conditions and local anesthesia, the right common femoral  artery access was performed with a micropuncture needle. Under direct ultrasound guidance, the right common femoral was accessed with a micropuncture kit. An ultrasound image was saved for documentation purposes. This allowed for placement of a 5-French vascular sheath. A limited arteriogram was performed through the side arm of the sheath  confirming appropriate access within the right common femoral artery. Over a Bentson wire, a Mickelson catheter was advanced the caudal aspect of the thoracic aorta where was reformed, back bled and flushed. The Mickelson catheter was then utilized to select the celiac artery selective celiac arteriogram was performed. Next, with the use of a fathom 16 microwire, a regular Renegade microcatheter was utilized to select the common hepatic artery and a selective common hepatic arteriogram was performed. The microcatheter was then utilized to select a hypertrophied pancreaticoduodenal artery arising from the proximal aspect of the GDA and a selective pancreaticoduodenal arteriogram was performed. The microcatheter was advanced to its mid/caudal aspect of the pancreaticoduodenal artery and vessel was subsequently percutaneously coil embolized with multiple overlapping 2 mm, 3 mm and 4 mm soft and fibered interlock coils to near the vessel's origin. The microcatheter was then retracted to the level of the gastroduodenal artery and a selective gastroduodenal arteriogram was performed. The microcatheter was advanced to the distal aspect of the GDA, at the takeoff of the right gastroepiploic artery. Next, the GDA was percutaneously coil embolized with multiple overlapping 5 mm, 6 mm, 8 mm and 10 mm diameter soft and fibered interlock coils. The microcatheter was retracted to the level of the common hepatic artery and post embolization common hepatic arteriograms were performed. Images were reviewed and the procedure was terminated. All wires, catheters and sheaths were removed from the patient. Hemostasis was achieved at the right groin access site with deployment of an ExoSeal closure device and manual compression. The patient tolerated the procedure well without immediate post procedural complication. FINDINGS: Selective celiac arteriogram demonstrates conventional takeoff of the GDA however note is made of a prominent  pancreaticoduodenal artery which arises from the proximal GDA. No discrete areas contrast extravasation or vessel irregularity are identified angiographically. Given endoscopic confirmation duodenal bleeding, the decision was made to proceed with prophylactic embolization. Selective pancreaticoduodenal arteriogram confirms the vessel contribute multiple distal tributaries to the descending portion of the duodenum and as such the vessel was successfully percutaneously coil embolized with multiple overlapping coils. Selective gastroduodenal arteriogram demonstrates conventional branching pattern, terminating in a right gastroepiploic artery. The gastroduodenal artery was then successfully percutaneously embolized with multiple overlapping coils to near the origin of the GDA. Completion common hepatic arteriogram demonstrates a technically excellent result with no flow into either the GDA or pancreaticoduodenal arteries. IMPRESSION: Technically successful prophylactic percutaneous coil embolization of the GDA and the pancreaticoduodenal artery for endoscopically confirmed acute upper GI/duodenal bleeding. PLAN: - The patient is to remain flat for 4 hours with right leg straight. - The patient will continue to experience several additional bloody bowel movements and may continue to require additional resuscitation (as she was bleeding both before the procedure and the endoscopy unit), however ultimately I am hopeful she will stabilize in the coming days muted rest of resuscitation - Repeat endoscopy may be performed at the discretion of the GI service as indicated. Electronically Signed   By: Sandi Mariscal M.D.   On: 12/06/2020 13:28   IR Angiogram Selective Each Additional Vessel  Result Date: 12/06/2020 INDICATION: History of chronic intermittent GI bleeding with endoscopy performed earlier today demonstrating acute bleeding at the level  of the duodenum. As such, patient presents for mesenteric arteriogram and  embolization. EXAM: 1. ULTRASOUND GUIDANCE FOR ARTERIAL ACCESS 2. SELECTIVE CELIAC ARTERIOGRAM 3. SUB SELECTIVE COMMON HEPATIC ARTERIOGRAM 4. SUB SELECTIVE PANCREATICODUODENAL ARTERIOGRAM AND PERCUTANEOUS COIL EMBOLIZATION 5. SUB SELECTIVE GASTRODUODENAL ARTERIOGRAM AND PERCUTANEOUS COIL EMBOLIZATION COMPARISON:  CT abdomen and pelvis-11/14/2019 MEDICATIONS: None ANESTHESIA/SEDATION: Moderate (conscious) sedation was employed during this procedure. A total of Versed 2 mg and Fentanyl 100 mcg was administered intravenously. Moderate Sedation Time: 65 minutes. The patient's level of consciousness and vital signs were monitored continuously by radiology nursing throughout the procedure under my direct supervision. CONTRAST:  70 cc Omnipaque 350 FLUOROSCOPY TIME:  20 minutes, 30 seconds (6,283 mGy) COMPLICATIONS: None immediate. PROCEDURE: Informed consent was obtained from the patient following explanation of the procedure, risks, benefits and alternatives. All questions were addressed. A time out was performed prior to the initiation of the procedure. Maximal barrier sterile technique utilized including caps, mask, sterile gowns, sterile gloves, large sterile drape, hand hygiene, and Betadine prep. The right femoral head was marked fluoroscopically. Under sterile conditions and local anesthesia, the right common femoral artery access was performed with a micropuncture needle. Under direct ultrasound guidance, the right common femoral was accessed with a micropuncture kit. An ultrasound image was saved for documentation purposes. This allowed for placement of a 5-French vascular sheath. A limited arteriogram was performed through the side arm of the sheath confirming appropriate access within the right common femoral artery. Over a Bentson wire, a Mickelson catheter was advanced the caudal aspect of the thoracic aorta where was reformed, back bled and flushed. The Mickelson catheter was then utilized to select the  celiac artery selective celiac arteriogram was performed. Next, with the use of a fathom 16 microwire, a regular Renegade microcatheter was utilized to select the common hepatic artery and a selective common hepatic arteriogram was performed. The microcatheter was then utilized to select a hypertrophied pancreaticoduodenal artery arising from the proximal aspect of the GDA and a selective pancreaticoduodenal arteriogram was performed. The microcatheter was advanced to its mid/caudal aspect of the pancreaticoduodenal artery and vessel was subsequently percutaneously coil embolized with multiple overlapping 2 mm, 3 mm and 4 mm soft and fibered interlock coils to near the vessel's origin. The microcatheter was then retracted to the level of the gastroduodenal artery and a selective gastroduodenal arteriogram was performed. The microcatheter was advanced to the distal aspect of the GDA, at the takeoff of the right gastroepiploic artery. Next, the GDA was percutaneously coil embolized with multiple overlapping 5 mm, 6 mm, 8 mm and 10 mm diameter soft and fibered interlock coils. The microcatheter was retracted to the level of the common hepatic artery and post embolization common hepatic arteriograms were performed. Images were reviewed and the procedure was terminated. All wires, catheters and sheaths were removed from the patient. Hemostasis was achieved at the right groin access site with deployment of an ExoSeal closure device and manual compression. The patient tolerated the procedure well without immediate post procedural complication. FINDINGS: Selective celiac arteriogram demonstrates conventional takeoff of the GDA however note is made of a prominent pancreaticoduodenal artery which arises from the proximal GDA. No discrete areas contrast extravasation or vessel irregularity are identified angiographically. Given endoscopic confirmation duodenal bleeding, the decision was made to proceed with prophylactic  embolization. Selective pancreaticoduodenal arteriogram confirms the vessel contribute multiple distal tributaries to the descending portion of the duodenum and as such the vessel was successfully percutaneously coil embolized with multiple overlapping coils. Selective  gastroduodenal arteriogram demonstrates conventional branching pattern, terminating in a right gastroepiploic artery. The gastroduodenal artery was then successfully percutaneously embolized with multiple overlapping coils to near the origin of the GDA. Completion common hepatic arteriogram demonstrates a technically excellent result with no flow into either the GDA or pancreaticoduodenal arteries. IMPRESSION: Technically successful prophylactic percutaneous coil embolization of the GDA and the pancreaticoduodenal artery for endoscopically confirmed acute upper GI/duodenal bleeding. PLAN: - The patient is to remain flat for 4 hours with right leg straight. - The patient will continue to experience several additional bloody bowel movements and may continue to require additional resuscitation (as she was bleeding both before the procedure and the endoscopy unit), however ultimately I am hopeful she will stabilize in the coming days muted rest of resuscitation - Repeat endoscopy may be performed at the discretion of the GI service as indicated. Electronically Signed   By: Sandi Mariscal M.D.   On: 12/06/2020 13:28   IR Angiogram Selective Each Additional Vessel  Result Date: 12/06/2020 INDICATION: History of chronic intermittent GI bleeding with endoscopy performed earlier today demonstrating acute bleeding at the level of the duodenum. As such, patient presents for mesenteric arteriogram and embolization. EXAM: 1. ULTRASOUND GUIDANCE FOR ARTERIAL ACCESS 2. SELECTIVE CELIAC ARTERIOGRAM 3. SUB SELECTIVE COMMON HEPATIC ARTERIOGRAM 4. SUB SELECTIVE PANCREATICODUODENAL ARTERIOGRAM AND PERCUTANEOUS COIL EMBOLIZATION 5. SUB SELECTIVE GASTRODUODENAL  ARTERIOGRAM AND PERCUTANEOUS COIL EMBOLIZATION COMPARISON:  CT abdomen and pelvis-11/14/2019 MEDICATIONS: None ANESTHESIA/SEDATION: Moderate (conscious) sedation was employed during this procedure. A total of Versed 2 mg and Fentanyl 100 mcg was administered intravenously. Moderate Sedation Time: 65 minutes. The patient's level of consciousness and vital signs were monitored continuously by radiology nursing throughout the procedure under my direct supervision. CONTRAST:  70 cc Omnipaque 350 FLUOROSCOPY TIME:  20 minutes, 30 seconds (5,400 mGy) COMPLICATIONS: None immediate. PROCEDURE: Informed consent was obtained from the patient following explanation of the procedure, risks, benefits and alternatives. All questions were addressed. A time out was performed prior to the initiation of the procedure. Maximal barrier sterile technique utilized including caps, mask, sterile gowns, sterile gloves, large sterile drape, hand hygiene, and Betadine prep. The right femoral head was marked fluoroscopically. Under sterile conditions and local anesthesia, the right common femoral artery access was performed with a micropuncture needle. Under direct ultrasound guidance, the right common femoral was accessed with a micropuncture kit. An ultrasound image was saved for documentation purposes. This allowed for placement of a 5-French vascular sheath. A limited arteriogram was performed through the side arm of the sheath confirming appropriate access within the right common femoral artery. Over a Bentson wire, a Mickelson catheter was advanced the caudal aspect of the thoracic aorta where was reformed, back bled and flushed. The Mickelson catheter was then utilized to select the celiac artery selective celiac arteriogram was performed. Next, with the use of a fathom 16 microwire, a regular Renegade microcatheter was utilized to select the common hepatic artery and a selective common hepatic arteriogram was performed. The  microcatheter was then utilized to select a hypertrophied pancreaticoduodenal artery arising from the proximal aspect of the GDA and a selective pancreaticoduodenal arteriogram was performed. The microcatheter was advanced to its mid/caudal aspect of the pancreaticoduodenal artery and vessel was subsequently percutaneously coil embolized with multiple overlapping 2 mm, 3 mm and 4 mm soft and fibered interlock coils to near the vessel's origin. The microcatheter was then retracted to the level of the gastroduodenal artery and a selective gastroduodenal arteriogram was performed. The microcatheter was  advanced to the distal aspect of the GDA, at the takeoff of the right gastroepiploic artery. Next, the GDA was percutaneously coil embolized with multiple overlapping 5 mm, 6 mm, 8 mm and 10 mm diameter soft and fibered interlock coils. The microcatheter was retracted to the level of the common hepatic artery and post embolization common hepatic arteriograms were performed. Images were reviewed and the procedure was terminated. All wires, catheters and sheaths were removed from the patient. Hemostasis was achieved at the right groin access site with deployment of an ExoSeal closure device and manual compression. The patient tolerated the procedure well without immediate post procedural complication. FINDINGS: Selective celiac arteriogram demonstrates conventional takeoff of the GDA however note is made of a prominent pancreaticoduodenal artery which arises from the proximal GDA. No discrete areas contrast extravasation or vessel irregularity are identified angiographically. Given endoscopic confirmation duodenal bleeding, the decision was made to proceed with prophylactic embolization. Selective pancreaticoduodenal arteriogram confirms the vessel contribute multiple distal tributaries to the descending portion of the duodenum and as such the vessel was successfully percutaneously coil embolized with multiple overlapping  coils. Selective gastroduodenal arteriogram demonstrates conventional branching pattern, terminating in a right gastroepiploic artery. The gastroduodenal artery was then successfully percutaneously embolized with multiple overlapping coils to near the origin of the GDA. Completion common hepatic arteriogram demonstrates a technically excellent result with no flow into either the GDA or pancreaticoduodenal arteries. IMPRESSION: Technically successful prophylactic percutaneous coil embolization of the GDA and the pancreaticoduodenal artery for endoscopically confirmed acute upper GI/duodenal bleeding. PLAN: - The patient is to remain flat for 4 hours with right leg straight. - The patient will continue to experience several additional bloody bowel movements and may continue to require additional resuscitation (as she was bleeding both before the procedure and the endoscopy unit), however ultimately I am hopeful she will stabilize in the coming days muted rest of resuscitation - Repeat endoscopy may be performed at the discretion of the GI service as indicated. Electronically Signed   By: Sandi Mariscal M.D.   On: 12/06/2020 13:28   IR Angiogram Selective Each Additional Vessel  Result Date: 12/06/2020 INDICATION: History of chronic intermittent GI bleeding with endoscopy performed earlier today demonstrating acute bleeding at the level of the duodenum. As such, patient presents for mesenteric arteriogram and embolization. EXAM: 1. ULTRASOUND GUIDANCE FOR ARTERIAL ACCESS 2. SELECTIVE CELIAC ARTERIOGRAM 3. SUB SELECTIVE COMMON HEPATIC ARTERIOGRAM 4. SUB SELECTIVE PANCREATICODUODENAL ARTERIOGRAM AND PERCUTANEOUS COIL EMBOLIZATION 5. SUB SELECTIVE GASTRODUODENAL ARTERIOGRAM AND PERCUTANEOUS COIL EMBOLIZATION COMPARISON:  CT abdomen and pelvis-11/14/2019 MEDICATIONS: None ANESTHESIA/SEDATION: Moderate (conscious) sedation was employed during this procedure. A total of Versed 2 mg and Fentanyl 100 mcg was administered  intravenously. Moderate Sedation Time: 65 minutes. The patient's level of consciousness and vital signs were monitored continuously by radiology nursing throughout the procedure under my direct supervision. CONTRAST:  70 cc Omnipaque 350 FLUOROSCOPY TIME:  20 minutes, 30 seconds (1,093 mGy) COMPLICATIONS: None immediate. PROCEDURE: Informed consent was obtained from the patient following explanation of the procedure, risks, benefits and alternatives. All questions were addressed. A time out was performed prior to the initiation of the procedure. Maximal barrier sterile technique utilized including caps, mask, sterile gowns, sterile gloves, large sterile drape, hand hygiene, and Betadine prep. The right femoral head was marked fluoroscopically. Under sterile conditions and local anesthesia, the right common femoral artery access was performed with a micropuncture needle. Under direct ultrasound guidance, the right common femoral was accessed with a micropuncture kit. An ultrasound  image was saved for documentation purposes. This allowed for placement of a 5-French vascular sheath. A limited arteriogram was performed through the side arm of the sheath confirming appropriate access within the right common femoral artery. Over a Bentson wire, a Mickelson catheter was advanced the caudal aspect of the thoracic aorta where was reformed, back bled and flushed. The Mickelson catheter was then utilized to select the celiac artery selective celiac arteriogram was performed. Next, with the use of a fathom 16 microwire, a regular Renegade microcatheter was utilized to select the common hepatic artery and a selective common hepatic arteriogram was performed. The microcatheter was then utilized to select a hypertrophied pancreaticoduodenal artery arising from the proximal aspect of the GDA and a selective pancreaticoduodenal arteriogram was performed. The microcatheter was advanced to its mid/caudal aspect of the  pancreaticoduodenal artery and vessel was subsequently percutaneously coil embolized with multiple overlapping 2 mm, 3 mm and 4 mm soft and fibered interlock coils to near the vessel's origin. The microcatheter was then retracted to the level of the gastroduodenal artery and a selective gastroduodenal arteriogram was performed. The microcatheter was advanced to the distal aspect of the GDA, at the takeoff of the right gastroepiploic artery. Next, the GDA was percutaneously coil embolized with multiple overlapping 5 mm, 6 mm, 8 mm and 10 mm diameter soft and fibered interlock coils. The microcatheter was retracted to the level of the common hepatic artery and post embolization common hepatic arteriograms were performed. Images were reviewed and the procedure was terminated. All wires, catheters and sheaths were removed from the patient. Hemostasis was achieved at the right groin access site with deployment of an ExoSeal closure device and manual compression. The patient tolerated the procedure well without immediate post procedural complication. FINDINGS: Selective celiac arteriogram demonstrates conventional takeoff of the GDA however note is made of a prominent pancreaticoduodenal artery which arises from the proximal GDA. No discrete areas contrast extravasation or vessel irregularity are identified angiographically. Given endoscopic confirmation duodenal bleeding, the decision was made to proceed with prophylactic embolization. Selective pancreaticoduodenal arteriogram confirms the vessel contribute multiple distal tributaries to the descending portion of the duodenum and as such the vessel was successfully percutaneously coil embolized with multiple overlapping coils. Selective gastroduodenal arteriogram demonstrates conventional branching pattern, terminating in a right gastroepiploic artery. The gastroduodenal artery was then successfully percutaneously embolized with multiple overlapping coils to near the  origin of the GDA. Completion common hepatic arteriogram demonstrates a technically excellent result with no flow into either the GDA or pancreaticoduodenal arteries. IMPRESSION: Technically successful prophylactic percutaneous coil embolization of the GDA and the pancreaticoduodenal artery for endoscopically confirmed acute upper GI/duodenal bleeding. PLAN: - The patient is to remain flat for 4 hours with right leg straight. - The patient will continue to experience several additional bloody bowel movements and may continue to require additional resuscitation (as she was bleeding both before the procedure and the endoscopy unit), however ultimately I am hopeful she will stabilize in the coming days muted rest of resuscitation - Repeat endoscopy may be performed at the discretion of the GI service as indicated. Electronically Signed   By: Sandi Mariscal M.D.   On: 12/06/2020 13:28   IR US Guide Vasc Access Right  Result Date: 12/06/2020 INDICATION: History of chronic intermittent GI bleeding with endoscopy performed earlier today demonstrating acute bleeding at the level of the duodenum. As such, patient presents for mesenteric arteriogram and embolization. EXAM: 1. ULTRASOUND GUIDANCE FOR ARTERIAL ACCESS 2. SELECTIVE CELIAC ARTERIOGRAM 3.  SUB SELECTIVE COMMON HEPATIC ARTERIOGRAM 4. SUB SELECTIVE PANCREATICODUODENAL ARTERIOGRAM AND PERCUTANEOUS COIL EMBOLIZATION 5. SUB SELECTIVE GASTRODUODENAL ARTERIOGRAM AND PERCUTANEOUS COIL EMBOLIZATION COMPARISON:  CT abdomen and pelvis-11/14/2019 MEDICATIONS: None ANESTHESIA/SEDATION: Moderate (conscious) sedation was employed during this procedure. A total of Versed 2 mg and Fentanyl 100 mcg was administered intravenously. Moderate Sedation Time: 65 minutes. The patient's level of consciousness and vital signs were monitored continuously by radiology nursing throughout the procedure under my direct supervision. CONTRAST:  70 cc Omnipaque 350 FLUOROSCOPY TIME:  20 minutes, 30  seconds (1,937 mGy) COMPLICATIONS: None immediate. PROCEDURE: Informed consent was obtained from the patient following explanation of the procedure, risks, benefits and alternatives. All questions were addressed. A time out was performed prior to the initiation of the procedure. Maximal barrier sterile technique utilized including caps, mask, sterile gowns, sterile gloves, large sterile drape, hand hygiene, and Betadine prep. The right femoral head was marked fluoroscopically. Under sterile conditions and local anesthesia, the right common femoral artery access was performed with a micropuncture needle. Under direct ultrasound guidance, the right common femoral was accessed with a micropuncture kit. An ultrasound image was saved for documentation purposes. This allowed for placement of a 5-French vascular sheath. A limited arteriogram was performed through the side arm of the sheath confirming appropriate access within the right common femoral artery. Over a Bentson wire, a Mickelson catheter was advanced the caudal aspect of the thoracic aorta where was reformed, back bled and flushed. The Mickelson catheter was then utilized to select the celiac artery selective celiac arteriogram was performed. Next, with the use of a fathom 16 microwire, a regular Renegade microcatheter was utilized to select the common hepatic artery and a selective common hepatic arteriogram was performed. The microcatheter was then utilized to select a hypertrophied pancreaticoduodenal artery arising from the proximal aspect of the GDA and a selective pancreaticoduodenal arteriogram was performed. The microcatheter was advanced to its mid/caudal aspect of the pancreaticoduodenal artery and vessel was subsequently percutaneously coil embolized with multiple overlapping 2 mm, 3 mm and 4 mm soft and fibered interlock coils to near the vessel's origin. The microcatheter was then retracted to the level of the gastroduodenal artery and a selective  gastroduodenal arteriogram was performed. The microcatheter was advanced to the distal aspect of the GDA, at the takeoff of the right gastroepiploic artery. Next, the GDA was percutaneously coil embolized with multiple overlapping 5 mm, 6 mm, 8 mm and 10 mm diameter soft and fibered interlock coils. The microcatheter was retracted to the level of the common hepatic artery and post embolization common hepatic arteriograms were performed. Images were reviewed and the procedure was terminated. All wires, catheters and sheaths were removed from the patient. Hemostasis was achieved at the right groin access site with deployment of an ExoSeal closure device and manual compression. The patient tolerated the procedure well without immediate post procedural complication. FINDINGS: Selective celiac arteriogram demonstrates conventional takeoff of the GDA however note is made of a prominent pancreaticoduodenal artery which arises from the proximal GDA. No discrete areas contrast extravasation or vessel irregularity are identified angiographically. Given endoscopic confirmation duodenal bleeding, the decision was made to proceed with prophylactic embolization. Selective pancreaticoduodenal arteriogram confirms the vessel contribute multiple distal tributaries to the descending portion of the duodenum and as such the vessel was successfully percutaneously coil embolized with multiple overlapping coils. Selective gastroduodenal arteriogram demonstrates conventional branching pattern, terminating in a right gastroepiploic artery. The gastroduodenal artery was then successfully percutaneously embolized with multiple overlapping coils to  near the origin of the GDA. Completion common hepatic arteriogram demonstrates a technically excellent result with no flow into either the GDA or pancreaticoduodenal arteries. IMPRESSION: Technically successful prophylactic percutaneous coil embolization of the GDA and the pancreaticoduodenal artery  for endoscopically confirmed acute upper GI/duodenal bleeding. PLAN: - The patient is to remain flat for 4 hours with right leg straight. - The patient will continue to experience several additional bloody bowel movements and may continue to require additional resuscitation (as she was bleeding both before the procedure and the endoscopy unit), however ultimately I am hopeful she will stabilize in the coming days muted rest of resuscitation - Repeat endoscopy may be performed at the discretion of the GI service as indicated. Electronically Signed   By: Sandi Mariscal M.D.   On: 12/06/2020 13:28   DG Chest Port 1 View  Result Date: 12/04/2020 CLINICAL DATA:  Shortness of breath EXAM: PORTABLE CHEST 1 VIEW COMPARISON:  Chest x-ray dated October 19, 2020 FINDINGS: Unchanged cardiomegaly. Mediastinal contours are within normal limits. Clear lungs. No pleural effusion or pneumothorax. IMPRESSION: No active disease. Electronically Signed   By: Yetta Glassman M.D.   On: 12/04/2020 14:02   ECHOCARDIOGRAM COMPLETE  Result Date: 11/20/2020    ECHOCARDIOGRAM REPORT   Patient Name:   Stacey Reyes Date of Exam: 11/20/2020 Medical Rec #:  [id number]         Height:       61.0 in Accession #:    [id number]        Weight:       199.0 lb Date of Birth:  09/22/1945         BSA:          1.885 m Patient Age:    25 years          BP:           123/49 mmHg Patient Gender: F                 HR:           51 bpm. Exam Location:  [facility] Procedure: 2D Echo, Cardiac Doppler, Color Doppler and Intracardiac            Opacification Agent Indications:    I42.1 HOCM  History:        Patient has prior history of Echocardiogram examinations, most                 recent 03/24/2019. Risk Factors:Dyslipidemia and Hypertension.                 HOCM. Murmur.  Sonographer:    Wilford Sports Rodgers-Jones RDCS Referring Phys: [place]  1. Hypertrophic cardiomyopathy. Systolic anterior motion of the mitral valve is  noted. Significant intracavitary gradient noted. Peak velocity 5.1 m/s. Peak gradient 104.2 mmHg. Left ventricular ejection fraction, by estimation, is >75%. The left ventricle has hyperdynamic function. The left ventricle has no regional wall motion abnormalities. There is severe concentric left ventricular hypertrophy. Left ventricular diastolic parameters are consistent with Grade I diastolic dysfunction (impaired relaxation). Elevated left ventricular end-diastolic pressure.  2. Right ventricular systolic function is normal. The right ventricular size is normal.  3. Left atrial size was severely dilated.  4. The mitral valve is normal in structure. Mild mitral valve regurgitation. No evidence of mitral stenosis.  5. The aortic valve is calcified. There is mild calcification of the aortic valve. There is mild thickening of the aortic valve.  Aortic valve regurgitation is not visualized. Mild aortic valve stenosis. Aortic valve mean gradient measures 14.2 mmHg. Aortic valve Vmax measures 2.59 m/s.  6. Aortic dilatation noted. There is mild dilatation of the ascending aorta, measuring 37 mm.  7. The inferior vena cava is normal in size with greater than 50% respiratory variability, suggesting right atrial pressure of 3 mmHg. Conclusion(s)/Recommendation(s): Findings consistent with hypertrophic cardiomyopathy. FINDINGS  Left Ventricle: Hypertrophic cardiomyopathy. Systolic anterior motion of the mitral valve is noted. Significant intracavitary gradient noted. Peak velocity 5.1 m/s. Peak gradient 104.2 mmHg. Left ventricular ejection fraction, by estimation, is >75%. The left ventricle has hyperdynamic function. The left ventricle has no regional wall motion abnormalities. Definity contrast agent was given IV to delineate the left ventricular endocardial borders. The left ventricular internal cavity size was normal in size. There is severe concentric left ventricular hypertrophy. Left ventricular diastolic parameters  are consistent with Grade I diastolic dysfunction (impaired relaxation). Elevated left ventricular end-diastolic pressure. Right Ventricle: The right ventricular size is normal. No increase in right ventricular wall thickness. Right ventricular systolic function is normal. Left Atrium: Left atrial size was severely dilated. Right Atrium: Right atrial size was normal in size. Pericardium: Trivial pericardial effusion is present. Mitral Valve: The mitral valve is normal in structure. Mild mitral valve regurgitation. No evidence of mitral valve stenosis. Tricuspid Valve: The tricuspid valve is normal in structure. Tricuspid valve regurgitation is trivial. No evidence of tricuspid stenosis. Aortic Valve: The aortic valve is calcified. There is mild calcification of the aortic valve. There is mild thickening of the aortic valve. Aortic valve regurgitation is not visualized. Mild aortic stenosis is present. Aortic valve mean gradient measures  14.2 mmHg. Aortic valve peak gradient measures 26.9 mmHg. Pulmonic Valve: The pulmonic valve was normal in structure. Pulmonic valve regurgitation is not visualized. No evidence of pulmonic stenosis. Aorta: Aortic dilatation noted. There is mild dilatation of the ascending aorta, measuring 37 mm. Venous: The inferior vena cava is normal in size with greater than 50% respiratory variability, suggesting right atrial pressure of 3 mmHg. IAS/Shunts: No atrial level shunt detected by color flow Doppler.  LEFT VENTRICLE PLAX 2D LVIDd:         3.60 cm  Diastology LVIDs:         1.30 cm  LV e' medial:    4.03 cm/s LV PW:         2.30 cm  LV E/e' medial:  22.8 LV IVS:        1.90 cm  LV e' lateral:   6.31 cm/s LVOT diam:     2.10 cm  LV E/e' lateral: 14.6 LVOT Area:     3.46 cm  RIGHT VENTRICLE             IVC RV Basal diam:  4.10 cm     IVC diam: 1.30 cm RV S prime:     15.20 cm/s TAPSE (M-mode): 3.7 cm LEFT ATRIUM             Index       RIGHT ATRIUM           Index LA diam:        4.20  cm 2.23 cm/m  RA Area:     11.50 cm LA Vol (A2C):   96.2 ml 51.04 ml/m RA Volume:   27.70 ml  14.70 ml/m LA Vol (A4C):   74.0 ml 39.26 ml/m LA Biplane Vol: 90.4 ml 47.96 ml/m  AORTIC VALVE AV Vmax:  259.26 cm/s AV Vmean:     167.087 cm/s AV VTI:       0.564 m AV Peak Grad: 26.9 mmHg AV Mean Grad: 14.2 mmHg  AORTA Ao Root diam: 3.50 cm Ao Asc diam:  3.70 cm MITRAL VALVE MV Area (PHT): 2.34 cm     SHUNTS MV Decel Time: 324 msec     Systemic Diam: 2.10 cm MV E velocity: 91.90 cm/s MV A velocity: 121.00 cm/s MV E/A ratio:  0.76 Skeet Latch MD Electronically signed by Skeet Latch MD Signature Date/Time: 11/20/2020/3:23:04 PM    Final    IR EMBO ART  VEN HEMORR LYMPH EXTRAV  INC GUIDE ROADMAPPING  Result Date: 12/06/2020 INDICATION: History of chronic intermittent GI bleeding with endoscopy performed earlier today demonstrating acute bleeding at the level of the duodenum. As such, patient presents for mesenteric arteriogram and embolization. EXAM: 1. ULTRASOUND GUIDANCE FOR ARTERIAL ACCESS 2. SELECTIVE CELIAC ARTERIOGRAM 3. SUB SELECTIVE COMMON HEPATIC ARTERIOGRAM 4. SUB SELECTIVE PANCREATICODUODENAL ARTERIOGRAM AND PERCUTANEOUS COIL EMBOLIZATION 5. SUB SELECTIVE GASTRODUODENAL ARTERIOGRAM AND PERCUTANEOUS COIL EMBOLIZATION COMPARISON:  CT abdomen and pelvis-11/14/2019 MEDICATIONS: None ANESTHESIA/SEDATION: Moderate (conscious) sedation was employed during this procedure. A total of Versed 2 mg and Fentanyl 100 mcg was administered intravenously. Moderate Sedation Time: 65 minutes. The patient's level of consciousness and vital signs were monitored continuously by radiology nursing throughout the procedure under my direct supervision. CONTRAST:  70 cc Omnipaque 350 FLUOROSCOPY TIME:  20 minutes, 30 seconds (7,793 mGy) COMPLICATIONS: None immediate. PROCEDURE: Informed consent was obtained from the patient following explanation of the procedure, risks, benefits and alternatives. All questions were  addressed. A time out was performed prior to the initiation of the procedure. Maximal barrier sterile technique utilized including caps, mask, sterile gowns, sterile gloves, large sterile drape, hand hygiene, and Betadine prep. The right femoral head was marked fluoroscopically. Under sterile conditions and local anesthesia, the right common femoral artery access was performed with a micropuncture needle. Under direct ultrasound guidance, the right common femoral was accessed with a micropuncture kit. An ultrasound image was saved for documentation purposes. This allowed for placement of a 5-French vascular sheath. A limited arteriogram was performed through the side arm of the sheath confirming appropriate access within the right common femoral artery. Over a Bentson wire, a Mickelson catheter was advanced the caudal aspect of the thoracic aorta where was reformed, back bled and flushed. The Mickelson catheter was then utilized to select the celiac artery selective celiac arteriogram was performed. Next, with the use of a fathom 16 microwire, a regular Renegade microcatheter was utilized to select the common hepatic artery and a selective common hepatic arteriogram was performed. The microcatheter was then utilized to select a hypertrophied pancreaticoduodenal artery arising from the proximal aspect of the GDA and a selective pancreaticoduodenal arteriogram was performed. The microcatheter was advanced to its mid/caudal aspect of the pancreaticoduodenal artery and vessel was subsequently percutaneously coil embolized with multiple overlapping 2 mm, 3 mm and 4 mm soft and fibered interlock coils to near the vessel's origin. The microcatheter was then retracted to the level of the gastroduodenal artery and a selective gastroduodenal arteriogram was performed. The microcatheter was advanced to the distal aspect of the GDA, at the takeoff of the right gastroepiploic artery. Next, the GDA was percutaneously coil  embolized with multiple overlapping 5 mm, 6 mm, 8 mm and 10 mm diameter soft and fibered interlock coils. The microcatheter was retracted to the level of the common hepatic artery and post  embolization common hepatic arteriograms were performed. Images were reviewed and the procedure was terminated. All wires, catheters and sheaths were removed from the patient. Hemostasis was achieved at the right groin access site with deployment of an ExoSeal closure device and manual compression. The patient tolerated the procedure well without immediate post procedural complication. FINDINGS: Selective celiac arteriogram demonstrates conventional takeoff of the GDA however note is made of a prominent pancreaticoduodenal artery which arises from the proximal GDA. No discrete areas contrast extravasation or vessel irregularity are identified angiographically. Given endoscopic confirmation duodenal bleeding, the decision was made to proceed with prophylactic embolization. Selective pancreaticoduodenal arteriogram confirms the vessel contribute multiple distal tributaries to the descending portion of the duodenum and as such the vessel was successfully percutaneously coil embolized with multiple overlapping coils. Selective gastroduodenal arteriogram demonstrates conventional branching pattern, terminating in a right gastroepiploic artery. The gastroduodenal artery was then successfully percutaneously embolized with multiple overlapping coils to near the origin of the GDA. Completion common hepatic arteriogram demonstrates a technically excellent result with no flow into either the GDA or pancreaticoduodenal arteries. IMPRESSION: Technically successful prophylactic percutaneous coil embolization of the GDA and the pancreaticoduodenal artery for endoscopically confirmed acute upper GI/duodenal bleeding. PLAN: - The patient is to remain flat for 4 hours with right leg straight. - The patient will continue to experience several  additional bloody bowel movements and may continue to require additional resuscitation (as she was bleeding both before the procedure and the endoscopy unit), however ultimately I am hopeful she will stabilize in the coming days muted rest of resuscitation - Repeat endoscopy may be performed at the discretion of the GI service as indicated. Electronically Signed   By: Sandi Mariscal M.D.   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  Presenting with acute on chronic symptomatic anemia with need for transfusion, associated melena, heme positive stool.  Hemoglobin 11.2 five days prior to admission.  Hemoglobin 7.6 on admission.  Most recent EGD July 2022 during last admission for acute GI bleeding, showed no obvious lesions.  Capsule study then completed with duodenitis but no obvious AVMs.  She had 3 total capsule studies, in November 2021 revealing AVMs in the proximal small bowel.  Colonoscopy from November 2021 with multiple adenomas, diverticulosis.   EGD September 13 with multiple gastric polyps.  Blood in the second portion of duodenum,Dieulafoy lesion suspected.  Use of duodenoscope did not help in localizing bleeding site.  Evening of September 13, she underwent post mesenteric arteriogram and empiric percutaneous coil embolization of the GDA and hypertrophied pancreaticoduodenal artery.  Hemoglobin this admission 7.6.  After 2 units of packed red blood cells hemoglobin was 8.3.  Over the last 48 hours she continued to trickle downward 8.3--> 7.8--> 7.5-->7.0.  Received additional unit of packed red blood cells.  Hemoglobin 7.5 this morning.    Plan: Patient should follow up with her primary GI, [facility] as outpatient.  She will need to have her H/H checked early next week. She states she has standing order with her PCP and will go  there.  If recurrent signs of bleeding, she will go to the ER.  Continue pantoprazole 32m BID for 8 weeks, then daily thereafter.  OLatimerfor discharge from GI standpoint.   LLaureen Ochs LBernarda CaffeyRCherokee Regional Medical CenterGastroenterology Associates [phone] AM    LOS:  3 days

## 2020-12-07 NOTE — Care Management Important Message (Signed)
Important Message  Patient Details  Name: Stacey Reyes MRN: [id number] Date of Birth: 1946-01-04   Medicare Important Message Given:  Yes     Tommy Medal 12/07/2020, 11:53 AM

## 2020-12-07 NOTE — Discharge Summary (Signed)
Physician Discharge Summary  Stacey Reyes XTK:[id number] DOB: Dec 22, 1945 DOA: 12/04/2020  PCP: [name], DO  Admit date: 12/04/2020 Discharge date: 12/07/2020  Admitted From:  Home  Disposition: Home   Recommendations for Outpatient Follow-up:  Follow up with PCP in 1 weeks to have hemoglobin checked.  Follow up with [facility] in 2 weeks.  Please obtain CBC in one week to follow up hemoglobin Please ftake protonix 40 mg twice daily x 8 weeks, then 1 po daily   Discharge Condition: STABLE   CODE STATUS: FULL DIET: soft food recommended   Brief Hospitalization Summary: Please see all hospital notes, images, labs for full details of the hospitalization. ADMISSION HPI: Stacey Reyes is a 75 y.o. female with medical history significant for hypertension, hyperlipidemia, GERD, small bowel AVMs, and chronic anemia requiring iron transfusions who presented to the ED with complaints of dark stools over the last 5 days.  She has had some shortness of breath over the last 1 week that was particularly worsened with exertion, but she denies any chest pain or pressure.  She has had some lightheadedness and dizziness over the last 1 day.  She denies any nausea, vomiting, diarrhea, fevers, or chills.  She was recently discharged on 10/22/2020 for the same and had EGD at that time that was unrevealing and underwent capsule endoscopy revealing AVMs.   ED Course: Vital signs stable, but with some soft blood pressure readings noted.  Hemoglobin 7.6, chest x-ray with no acute findings and EKG with normal sinus rhythm and some mild LVH but no other significant abnormalities.  Troponin noted to be 105.  2 units PRBCs ordered by EDP.  GI planning to consult.  FOBT is positive.  HOSPITAL COURSE by problem list  Acute on chronic blood loss anemia likely due to bleeding Dieulafoy lesion or diverticulum -Improved after 2 unit PRBC transfusion on 9/12, but another 2 units ordered given active bleeding  noted on EGD on 9/13 -Patient required transfer to [facility] so that IR can do angiogram with embolization with return afterwards.  Pt tolerated well and procedure thought to be a success.  -continuing PPI twice daily x 8 weeks then 1 po daily per GI.  - Follow up with PCP in 1 week for H/H check.  - Follow up with [facility] for recheck in 2 weeks.  -advance diet as tolerated   Elevated troponin-in the setting of GI bleed -Likely due to demand ischemia, no signs of ACS noted   Hypertension/Dyslipidemia -Continue bisoprolol.  Crestor for lipid lowering   Hypertrophic cardiomyopathy -Continue bisoprolol -Follow-up with cardiology outpatient     DVT prophylaxis:SCDs Code Status: Full Family Communication: plan discussed with patient at bedside, verbalized understanding Disposition Plan: home Status is: Inpatient   Discharge Diagnoses:  Active Problems:   Acute blood loss anemia   Acute GI bleeding  Discharge Instructions:  Allergies as of 12/07/2020       Reactions   Sulfa Antibiotics Rash        Medication List     TAKE these medications    bisoprolol 5 MG tablet Commonly known as: ZEBETA Take 0.5 tablets (2.5 mg total) by mouth daily.   CALCIUM-MAGNESIUM-ZINC-D3 PO Take 1 tablet by mouth daily.   clobetasol ointment 0.05 % Commonly known as: TEMOVATE Apply 1 application topically 2 (two) times daily as needed (skin).   COPPER CAPS PO Take 1 capsule by mouth daily.   cyanocobalamin 2000 MCG tablet Commonly known as: [facility] VITAMIN  B12 Take 1 tablet (2,000 mcg total) by mouth daily.   Magnesium 200 MG Tabs Take 1 tablet (200 mg total) by mouth daily.   multivitamin capsule Take 1 capsule by mouth daily.   pantoprazole 40 MG tablet Commonly known as: PROTONIX 1 po BID x 8 weeks, then 1 po daily What changed:  how much to take how to take this when to take this additional instructions   rosuvastatin 10 MG tablet Commonly known as: CRESTOR TAKE 1  TABLET BY MOUTH AT  BEDTIME What changed: when to take this   vitamin C 1000 MG tablet Take 1,000 mg by mouth daily.   vitamin E 180 MG (400 UNITS) capsule Take 400 Units by mouth daily.        Follow-up [facility]. Schedule an appointment as soon as possible for a visit in 2 week(s).   Specialty: Gastroenterology Why: Hospital Follow Up Contact information: [place] [id number] [place], [place], [place]. Schedule an appointment as soon as possible for a visit in 1 week(s).   Specialty: Family Medicine Why: Hospital Follow Up and check H/H Contact information: [place] [address] [phone]         Minus Breeding, MD .   Specialty: Cardiology Contact information: [address] [place] [address] [phone]                Allergies  Allergen Reactions   Sulfa Antibiotics Rash   Allergies as of 12/07/2020       Reactions   Sulfa Antibiotics Rash        Medication List     TAKE these medications    bisoprolol 5 MG tablet Commonly known as: ZEBETA Take 0.5 tablets (2.5 mg total) by mouth daily.   CALCIUM-MAGNESIUM-ZINC-D3 PO Take 1 tablet by mouth daily.   clobetasol ointment 0.05 % Commonly known as: TEMOVATE Apply 1 application topically 2 (two) times daily as needed (skin).   COPPER CAPS PO Take 1 capsule by mouth daily.   cyanocobalamin 2000 MCG tablet Commonly known as: CVS VITAMIN B12 Take 1 tablet (2,000 mcg total) by mouth daily.   Magnesium 200 MG Tabs Take 1 tablet (200 mg total) by mouth daily.   multivitamin capsule Take 1 capsule by mouth daily.   pantoprazole 40 MG tablet Commonly known as: PROTONIX 1 po BID x 8 weeks, then 1 po daily What changed:  how much to take how to take this when to take this additional instructions   rosuvastatin 10 MG tablet Commonly known as: CRESTOR TAKE 1  TABLET BY MOUTH AT  BEDTIME What changed: when to take this   vitamin C 1000 MG tablet Take 1,000 mg by mouth daily.   vitamin E 180 MG (400 UNITS) capsule Take 400 Units by mouth daily.        Procedures/Studies: IR Angiogram Visceral Selective  Result Date: 12/06/2020 INDICATION: History of chronic intermittent GI bleeding with endoscopy performed earlier today demonstrating acute bleeding at the level of the duodenum. As such, patient presents for mesenteric arteriogram and embolization. EXAM: 1. ULTRASOUND GUIDANCE FOR ARTERIAL ACCESS 2. SELECTIVE CELIAC ARTERIOGRAM 3. SUB SELECTIVE COMMON HEPATIC ARTERIOGRAM 4. SUB SELECTIVE PANCREATICODUODENAL ARTERIOGRAM AND PERCUTANEOUS COIL EMBOLIZATION 5. SUB SELECTIVE GASTRODUODENAL ARTERIOGRAM AND PERCUTANEOUS COIL EMBOLIZATION COMPARISON:  CT abdomen and pelvis-11/14/2019 MEDICATIONS: None ANESTHESIA/SEDATION: Moderate (conscious) sedation was employed during this procedure. A  total of Versed 2 mg and Fentanyl 100 mcg was administered intravenously. Moderate Sedation Time: 65 minutes. The patient's level of consciousness and vital signs were monitored continuously by radiology nursing throughout the procedure under my direct supervision. CONTRAST:  70 cc Omnipaque 350 FLUOROSCOPY TIME:  20 minutes, 30 seconds (0,109 mGy) COMPLICATIONS: None immediate. PROCEDURE: Informed consent was obtained from the patient following explanation of the procedure, risks, benefits and alternatives. All questions were addressed. A time out was performed prior to the initiation of the procedure. Maximal barrier sterile technique utilized including caps, mask, sterile gowns, sterile gloves, large sterile drape, hand hygiene, and Betadine prep. The right femoral head was marked fluoroscopically. Under sterile conditions and local anesthesia, the right common femoral artery access was performed with a micropuncture needle. Under direct ultrasound guidance, the right common  femoral was accessed with a micropuncture kit. An ultrasound image was saved for documentation purposes. This allowed for placement of a 5-French vascular sheath. A limited arteriogram was performed through the side arm of the sheath confirming appropriate access within the right common femoral artery. Over a Bentson wire, a Mickelson catheter was advanced the caudal aspect of the thoracic aorta where was reformed, back bled and flushed. The Mickelson catheter was then utilized to select the celiac artery selective celiac arteriogram was performed. Next, with the use of a fathom 16 microwire, a regular Renegade microcatheter was utilized to select the common hepatic artery and a selective common hepatic arteriogram was performed. The microcatheter was then utilized to select a hypertrophied pancreaticoduodenal artery arising from the proximal aspect of the GDA and a selective pancreaticoduodenal arteriogram was performed. The microcatheter was advanced to its mid/caudal aspect of the pancreaticoduodenal artery and vessel was subsequently percutaneously coil embolized with multiple overlapping 2 mm, 3 mm and 4 mm soft and fibered interlock coils to near the vessel's origin. The microcatheter was then retracted to the level of the gastroduodenal artery and a selective gastroduodenal arteriogram was performed. The microcatheter was advanced to the distal aspect of the GDA, at the takeoff of the right gastroepiploic artery. Next, the GDA was percutaneously coil embolized with multiple overlapping 5 mm, 6 mm, 8 mm and 10 mm diameter soft and fibered interlock coils. The microcatheter was retracted to the level of the common hepatic artery and post embolization common hepatic arteriograms were performed. Images were reviewed and the procedure was terminated. All wires, catheters and sheaths were removed from the patient. Hemostasis was achieved at the right groin access site with deployment of an ExoSeal closure device  and manual compression. The patient tolerated the procedure well without immediate post procedural complication. FINDINGS: Selective celiac arteriogram demonstrates conventional takeoff of the GDA however note is made of a prominent pancreaticoduodenal artery which arises from the proximal GDA. No discrete areas contrast extravasation or vessel irregularity are identified angiographically. Given endoscopic confirmation duodenal bleeding, the decision was made to proceed with prophylactic embolization. Selective pancreaticoduodenal arteriogram confirms the vessel contribute multiple distal tributaries to the descending portion of the duodenum and as such the vessel was successfully percutaneously coil embolized with multiple overlapping coils. Selective gastroduodenal arteriogram demonstrates conventional branching pattern, terminating in a right gastroepiploic artery. The gastroduodenal artery was then successfully percutaneously embolized with multiple overlapping coils to near the origin of the GDA. Completion common hepatic arteriogram demonstrates a technically excellent result with no flow into either the GDA or pancreaticoduodenal arteries. IMPRESSION: Technically successful prophylactic percutaneous coil embolization of the GDA and the pancreaticoduodenal artery for endoscopically  confirmed acute upper GI/duodenal bleeding. PLAN: - The patient is to remain flat for 4 hours with right leg straight. - The patient will continue to experience several additional bloody bowel movements and may continue to require additional resuscitation (as she was bleeding both before the procedure and the endoscopy unit), however ultimately I am hopeful she will stabilize in the coming days muted rest of resuscitation - Repeat endoscopy may be performed at the discretion of the GI service as indicated. Electronically Signed   By: Sandi Mariscal M.D.   On: 12/06/2020 13:28   IR Angiogram Selective Each Additional Vessel  Result  Date: 12/06/2020 INDICATION: History of chronic intermittent GI bleeding with endoscopy performed earlier today demonstrating acute bleeding at the level of the duodenum. As such, patient presents for mesenteric arteriogram and embolization. EXAM: 1. ULTRASOUND GUIDANCE FOR ARTERIAL ACCESS 2. SELECTIVE CELIAC ARTERIOGRAM 3. SUB SELECTIVE COMMON HEPATIC ARTERIOGRAM 4. SUB SELECTIVE PANCREATICODUODENAL ARTERIOGRAM AND PERCUTANEOUS COIL EMBOLIZATION 5. SUB SELECTIVE GASTRODUODENAL ARTERIOGRAM AND PERCUTANEOUS COIL EMBOLIZATION COMPARISON:  CT abdomen and pelvis-11/14/2019 MEDICATIONS: None ANESTHESIA/SEDATION: Moderate (conscious) sedation was employed during this procedure. A total of Versed 2 mg and Fentanyl 100 mcg was administered intravenously. Moderate Sedation Time: 65 minutes. The patient's level of consciousness and vital signs were monitored continuously by radiology nursing throughout the procedure under my direct supervision. CONTRAST:  70 cc Omnipaque 350 FLUOROSCOPY TIME:  20 minutes, 30 seconds (7,939 mGy) COMPLICATIONS: None immediate. PROCEDURE: Informed consent was obtained from the patient following explanation of the procedure, risks, benefits and alternatives. All questions were addressed. A time out was performed prior to the initiation of the procedure. Maximal barrier sterile technique utilized including caps, mask, sterile gowns, sterile gloves, large sterile drape, hand hygiene, and Betadine prep. The right femoral head was marked fluoroscopically. Under sterile conditions and local anesthesia, the right common femoral artery access was performed with a micropuncture needle. Under direct ultrasound guidance, the right common femoral was accessed with a micropuncture kit. An ultrasound image was saved for documentation purposes. This allowed for placement of a 5-French vascular sheath. A limited arteriogram was performed through the side arm of the sheath confirming appropriate access within  the right common femoral artery. Over a Bentson wire, a Mickelson catheter was advanced the caudal aspect of the thoracic aorta where was reformed, back bled and flushed. The Mickelson catheter was then utilized to select the celiac artery selective celiac arteriogram was performed. Next, with the use of a fathom 16 microwire, a regular Renegade microcatheter was utilized to select the common hepatic artery and a selective common hepatic arteriogram was performed. The microcatheter was then utilized to select a hypertrophied pancreaticoduodenal artery arising from the proximal aspect of the GDA and a selective pancreaticoduodenal arteriogram was performed. The microcatheter was advanced to its mid/caudal aspect of the pancreaticoduodenal artery and vessel was subsequently percutaneously coil embolized with multiple overlapping 2 mm, 3 mm and 4 mm soft and fibered interlock coils to near the vessel's origin. The microcatheter was then retracted to the level of the gastroduodenal artery and a selective gastroduodenal arteriogram was performed. The microcatheter was advanced to the distal aspect of the GDA, at the takeoff of the right gastroepiploic artery. Next, the GDA was percutaneously coil embolized with multiple overlapping 5 mm, 6 mm, 8 mm and 10 mm diameter soft and fibered interlock coils. The microcatheter was retracted to the level of the common hepatic artery and post embolization common hepatic arteriograms were performed. Images were reviewed and the  procedure was terminated. All wires, catheters and sheaths were removed from the patient. Hemostasis was achieved at the right groin access site with deployment of an ExoSeal closure device and manual compression. The patient tolerated the procedure well without immediate post procedural complication. FINDINGS: Selective celiac arteriogram demonstrates conventional takeoff of the GDA however note is made of a prominent pancreaticoduodenal artery which arises  from the proximal GDA. No discrete areas contrast extravasation or vessel irregularity are identified angiographically. Given endoscopic confirmation duodenal bleeding, the decision was made to proceed with prophylactic embolization. Selective pancreaticoduodenal arteriogram confirms the vessel contribute multiple distal tributaries to the descending portion of the duodenum and as such the vessel was successfully percutaneously coil embolized with multiple overlapping coils. Selective gastroduodenal arteriogram demonstrates conventional branching pattern, terminating in a right gastroepiploic artery. The gastroduodenal artery was then successfully percutaneously embolized with multiple overlapping coils to near the origin of the GDA. Completion common hepatic arteriogram demonstrates a technically excellent result with no flow into either the GDA or pancreaticoduodenal arteries. IMPRESSION: Technically successful prophylactic percutaneous coil embolization of the GDA and the pancreaticoduodenal artery for endoscopically confirmed acute upper GI/duodenal bleeding. PLAN: - The patient is to remain flat for 4 hours with right leg straight. - The patient will continue to experience several additional bloody bowel movements and may continue to require additional resuscitation (as she was bleeding both before the procedure and the endoscopy unit), however ultimately I am hopeful she will stabilize in the coming days muted rest of resuscitation - Repeat endoscopy may be performed at the discretion of the GI service as indicated. Electronically Signed   By: Sandi Mariscal M.D.   On: 12/06/2020 13:28   IR Angiogram Selective Each Additional Vessel  Result Date: 12/06/2020 INDICATION: History of chronic intermittent GI bleeding with endoscopy performed earlier today demonstrating acute bleeding at the level of the duodenum. As such, patient presents for mesenteric arteriogram and embolization. EXAM: 1. ULTRASOUND GUIDANCE  FOR ARTERIAL ACCESS 2. SELECTIVE CELIAC ARTERIOGRAM 3. SUB SELECTIVE COMMON HEPATIC ARTERIOGRAM 4. SUB SELECTIVE PANCREATICODUODENAL ARTERIOGRAM AND PERCUTANEOUS COIL EMBOLIZATION 5. SUB SELECTIVE GASTRODUODENAL ARTERIOGRAM AND PERCUTANEOUS COIL EMBOLIZATION COMPARISON:  CT abdomen and pelvis-11/14/2019 MEDICATIONS: None ANESTHESIA/SEDATION: Moderate (conscious) sedation was employed during this procedure. A total of Versed 2 mg and Fentanyl 100 mcg was administered intravenously. Moderate Sedation Time: 65 minutes. The patient's level of consciousness and vital signs were monitored continuously by radiology nursing throughout the procedure under my direct supervision. CONTRAST:  70 cc Omnipaque 350 FLUOROSCOPY TIME:  20 minutes, 30 seconds (3,220 mGy) COMPLICATIONS: None immediate. PROCEDURE: Informed consent was obtained from the patient following explanation of the procedure, risks, benefits and alternatives. All questions were addressed. A time out was performed prior to the initiation of the procedure. Maximal barrier sterile technique utilized including caps, mask, sterile gowns, sterile gloves, large sterile drape, hand hygiene, and Betadine prep. The right femoral head was marked fluoroscopically. Under sterile conditions and local anesthesia, the right common femoral artery access was performed with a micropuncture needle. Under direct ultrasound guidance, the right common femoral was accessed with a micropuncture kit. An ultrasound image was saved for documentation purposes. This allowed for placement of a 5-French vascular sheath. A limited arteriogram was performed through the side arm of the sheath confirming appropriate access within the right common femoral artery. Over a Bentson wire, a Mickelson catheter was advanced the caudal aspect of the thoracic aorta where was reformed, back bled and flushed. The Mickelson catheter was then utilized to  select the celiac artery selective celiac arteriogram was  performed. Next, with the use of a fathom 16 microwire, a regular Renegade microcatheter was utilized to select the common hepatic artery and a selective common hepatic arteriogram was performed. The microcatheter was then utilized to select a hypertrophied pancreaticoduodenal artery arising from the proximal aspect of the GDA and a selective pancreaticoduodenal arteriogram was performed. The microcatheter was advanced to its mid/caudal aspect of the pancreaticoduodenal artery and vessel was subsequently percutaneously coil embolized with multiple overlapping 2 mm, 3 mm and 4 mm soft and fibered interlock coils to near the vessel's origin. The microcatheter was then retracted to the level of the gastroduodenal artery and a selective gastroduodenal arteriogram was performed. The microcatheter was advanced to the distal aspect of the GDA, at the takeoff of the right gastroepiploic artery. Next, the GDA was percutaneously coil embolized with multiple overlapping 5 mm, 6 mm, 8 mm and 10 mm diameter soft and fibered interlock coils. The microcatheter was retracted to the level of the common hepatic artery and post embolization common hepatic arteriograms were performed. Images were reviewed and the procedure was terminated. All wires, catheters and sheaths were removed from the patient. Hemostasis was achieved at the right groin access site with deployment of an ExoSeal closure device and manual compression. The patient tolerated the procedure well without immediate post procedural complication. FINDINGS: Selective celiac arteriogram demonstrates conventional takeoff of the GDA however note is made of a prominent pancreaticoduodenal artery which arises from the proximal GDA. No discrete areas contrast extravasation or vessel irregularity are identified angiographically. Given endoscopic confirmation duodenal bleeding, the decision was made to proceed with prophylactic embolization. Selective pancreaticoduodenal  arteriogram confirms the vessel contribute multiple distal tributaries to the descending portion of the duodenum and as such the vessel was successfully percutaneously coil embolized with multiple overlapping coils. Selective gastroduodenal arteriogram demonstrates conventional branching pattern, terminating in a right gastroepiploic artery. The gastroduodenal artery was then successfully percutaneously embolized with multiple overlapping coils to near the origin of the GDA. Completion common hepatic arteriogram demonstrates a technically excellent result with no flow into either the GDA or pancreaticoduodenal arteries. IMPRESSION: Technically successful prophylactic percutaneous coil embolization of the GDA and the pancreaticoduodenal artery for endoscopically confirmed acute upper GI/duodenal bleeding. PLAN: - The patient is to remain flat for 4 hours with right leg straight. - The patient will continue to experience several additional bloody bowel movements and may continue to require additional resuscitation (as she was bleeding both before the procedure and the endoscopy unit), however ultimately I am hopeful she will stabilize in the coming days muted rest of resuscitation - Repeat endoscopy may be performed at the discretion of the GI service as indicated. Electronically Signed   By: Sandi Mariscal M.D.   On: 12/06/2020 13:28   IR Angiogram Selective Each Additional Vessel  Result Date: 12/06/2020 INDICATION: History of chronic intermittent GI bleeding with endoscopy performed earlier today demonstrating acute bleeding at the level of the duodenum. As such, patient presents for mesenteric arteriogram and embolization. EXAM: 1. ULTRASOUND GUIDANCE FOR ARTERIAL ACCESS 2. SELECTIVE CELIAC ARTERIOGRAM 3. SUB SELECTIVE COMMON HEPATIC ARTERIOGRAM 4. SUB SELECTIVE PANCREATICODUODENAL ARTERIOGRAM AND PERCUTANEOUS COIL EMBOLIZATION 5. SUB SELECTIVE GASTRODUODENAL ARTERIOGRAM AND PERCUTANEOUS COIL EMBOLIZATION  COMPARISON:  CT abdomen and pelvis-11/14/2019 MEDICATIONS: None ANESTHESIA/SEDATION: Moderate (conscious) sedation was employed during this procedure. A total of Versed 2 mg and Fentanyl 100 mcg was administered intravenously. Moderate Sedation Time: 65 minutes. The patient's level of consciousness and vital  signs were monitored continuously by radiology nursing throughout the procedure under my direct supervision. CONTRAST:  70 cc Omnipaque 350 FLUOROSCOPY TIME:  20 minutes, 30 seconds (8,185 mGy) COMPLICATIONS: None immediate. PROCEDURE: Informed consent was obtained from the patient following explanation of the procedure, risks, benefits and alternatives. All questions were addressed. A time out was performed prior to the initiation of the procedure. Maximal barrier sterile technique utilized including caps, mask, sterile gowns, sterile gloves, large sterile drape, hand hygiene, and Betadine prep. The right femoral head was marked fluoroscopically. Under sterile conditions and local anesthesia, the right common femoral artery access was performed with a micropuncture needle. Under direct ultrasound guidance, the right common femoral was accessed with a micropuncture kit. An ultrasound image was saved for documentation purposes. This allowed for placement of a 5-French vascular sheath. A limited arteriogram was performed through the side arm of the sheath confirming appropriate access within the right common femoral artery. Over a Bentson wire, a Mickelson catheter was advanced the caudal aspect of the thoracic aorta where was reformed, back bled and flushed. The Mickelson catheter was then utilized to select the celiac artery selective celiac arteriogram was performed. Next, with the use of a fathom 16 microwire, a regular Renegade microcatheter was utilized to select the common hepatic artery and a selective common hepatic arteriogram was performed. The microcatheter was then utilized to select a hypertrophied  pancreaticoduodenal artery arising from the proximal aspect of the GDA and a selective pancreaticoduodenal arteriogram was performed. The microcatheter was advanced to its mid/caudal aspect of the pancreaticoduodenal artery and vessel was subsequently percutaneously coil embolized with multiple overlapping 2 mm, 3 mm and 4 mm soft and fibered interlock coils to near the vessel's origin. The microcatheter was then retracted to the level of the gastroduodenal artery and a selective gastroduodenal arteriogram was performed. The microcatheter was advanced to the distal aspect of the GDA, at the takeoff of the right gastroepiploic artery. Next, the GDA was percutaneously coil embolized with multiple overlapping 5 mm, 6 mm, 8 mm and 10 mm diameter soft and fibered interlock coils. The microcatheter was retracted to the level of the common hepatic artery and post embolization common hepatic arteriograms were performed. Images were reviewed and the procedure was terminated. All wires, catheters and sheaths were removed from the patient. Hemostasis was achieved at the right groin access site with deployment of an ExoSeal closure device and manual compression. The patient tolerated the procedure well without immediate post procedural complication. FINDINGS: Selective celiac arteriogram demonstrates conventional takeoff of the GDA however note is made of a prominent pancreaticoduodenal artery which arises from the proximal GDA. No discrete areas contrast extravasation or vessel irregularity are identified angiographically. Given endoscopic confirmation duodenal bleeding, the decision was made to proceed with prophylactic embolization. Selective pancreaticoduodenal arteriogram confirms the vessel contribute multiple distal tributaries to the descending portion of the duodenum and as such the vessel was successfully percutaneously coil embolized with multiple overlapping coils. Selective gastroduodenal arteriogram demonstrates  conventional branching pattern, terminating in a right gastroepiploic artery. The gastroduodenal artery was then successfully percutaneously embolized with multiple overlapping coils to near the origin of the GDA. Completion common hepatic arteriogram demonstrates a technically excellent result with no flow into either the GDA or pancreaticoduodenal arteries. IMPRESSION: Technically successful prophylactic percutaneous coil embolization of the GDA and the pancreaticoduodenal artery for endoscopically confirmed acute upper GI/duodenal bleeding. PLAN: - The patient is to remain flat for 4 hours with right leg straight. - The patient will  continue to experience several additional bloody bowel movements and may continue to require additional resuscitation (as she was bleeding both before the procedure and the endoscopy unit), however ultimately I am hopeful she will stabilize in the coming days muted rest of resuscitation - Repeat endoscopy may be performed at the discretion of the GI service as indicated. Electronically Signed   By: Sandi Mariscal M.D.   On: 12/06/2020 13:28   IR US Guide Vasc Access Right  Result Date: 12/06/2020 INDICATION: History of chronic intermittent GI bleeding with endoscopy performed earlier today demonstrating acute bleeding at the level of the duodenum. As such, patient presents for mesenteric arteriogram and embolization. EXAM: 1. ULTRASOUND GUIDANCE FOR ARTERIAL ACCESS 2. SELECTIVE CELIAC ARTERIOGRAM 3. SUB SELECTIVE COMMON HEPATIC ARTERIOGRAM 4. SUB SELECTIVE PANCREATICODUODENAL ARTERIOGRAM AND PERCUTANEOUS COIL EMBOLIZATION 5. SUB SELECTIVE GASTRODUODENAL ARTERIOGRAM AND PERCUTANEOUS COIL EMBOLIZATION COMPARISON:  CT abdomen and pelvis-11/14/2019 MEDICATIONS: None ANESTHESIA/SEDATION: Moderate (conscious) sedation was employed during this procedure. A total of Versed 2 mg and Fentanyl 100 mcg was administered intravenously. Moderate Sedation Time: 65 minutes. The patient's level of  consciousness and vital signs were monitored continuously by radiology nursing throughout the procedure under my direct supervision. CONTRAST:  70 cc Omnipaque 350 FLUOROSCOPY TIME:  20 minutes, 30 seconds (4,315 mGy) COMPLICATIONS: None immediate. PROCEDURE: Informed consent was obtained from the patient following explanation of the procedure, risks, benefits and alternatives. All questions were addressed. A time out was performed prior to the initiation of the procedure. Maximal barrier sterile technique utilized including caps, mask, sterile gowns, sterile gloves, large sterile drape, hand hygiene, and Betadine prep. The right femoral head was marked fluoroscopically. Under sterile conditions and local anesthesia, the right common femoral artery access was performed with a micropuncture needle. Under direct ultrasound guidance, the right common femoral was accessed with a micropuncture kit. An ultrasound image was saved for documentation purposes. This allowed for placement of a 5-French vascular sheath. A limited arteriogram was performed through the side arm of the sheath confirming appropriate access within the right common femoral artery. Over a Bentson wire, a Mickelson catheter was advanced the caudal aspect of the thoracic aorta where was reformed, back bled and flushed. The Mickelson catheter was then utilized to select the celiac artery selective celiac arteriogram was performed. Next, with the use of a fathom 16 microwire, a regular Renegade microcatheter was utilized to select the common hepatic artery and a selective common hepatic arteriogram was performed. The microcatheter was then utilized to select a hypertrophied pancreaticoduodenal artery arising from the proximal aspect of the GDA and a selective pancreaticoduodenal arteriogram was performed. The microcatheter was advanced to its mid/caudal aspect of the pancreaticoduodenal artery and vessel was subsequently percutaneously coil embolized with  multiple overlapping 2 mm, 3 mm and 4 mm soft and fibered interlock coils to near the vessel's origin. The microcatheter was then retracted to the level of the gastroduodenal artery and a selective gastroduodenal arteriogram was performed. The microcatheter was advanced to the distal aspect of the GDA, at the takeoff of the right gastroepiploic artery. Next, the GDA was percutaneously coil embolized with multiple overlapping 5 mm, 6 mm, 8 mm and 10 mm diameter soft and fibered interlock coils. The microcatheter was retracted to the level of the common hepatic artery and post embolization common hepatic arteriograms were performed. Images were reviewed and the procedure was terminated. All wires, catheters and sheaths were removed from the patient. Hemostasis was achieved at the right groin access site with deployment  of an ExoSeal closure device and manual compression. The patient tolerated the procedure well without immediate post procedural complication. FINDINGS: Selective celiac arteriogram demonstrates conventional takeoff of the GDA however note is made of a prominent pancreaticoduodenal artery which arises from the proximal GDA. No discrete areas contrast extravasation or vessel irregularity are identified angiographically. Given endoscopic confirmation duodenal bleeding, the decision was made to proceed with prophylactic embolization. Selective pancreaticoduodenal arteriogram confirms the vessel contribute multiple distal tributaries to the descending portion of the duodenum and as such the vessel was successfully percutaneously coil embolized with multiple overlapping coils. Selective gastroduodenal arteriogram demonstrates conventional branching pattern, terminating in a right gastroepiploic artery. The gastroduodenal artery was then successfully percutaneously embolized with multiple overlapping coils to near the origin of the GDA. Completion common hepatic arteriogram demonstrates a technically excellent  result with no flow into either the GDA or pancreaticoduodenal arteries. IMPRESSION: Technically successful prophylactic percutaneous coil embolization of the GDA and the pancreaticoduodenal artery for endoscopically confirmed acute upper GI/duodenal bleeding. PLAN: - The patient is to remain flat for 4 hours with right leg straight. - The patient will continue to experience several additional bloody bowel movements and may continue to require additional resuscitation (as she was bleeding both before the procedure and the endoscopy unit), however ultimately I am hopeful she will stabilize in the coming days muted rest of resuscitation - Repeat endoscopy may be performed at the discretion of the GI service as indicated. Electronically Signed   By: Sandi Mariscal M.D.   On: 12/06/2020 13:28   DG Chest Port 1 View  Result Date: 12/04/2020 CLINICAL DATA:  Shortness of breath EXAM: PORTABLE CHEST 1 VIEW COMPARISON:  Chest x-ray dated October 19, 2020 FINDINGS: Unchanged cardiomegaly. Mediastinal contours are within normal limits. Clear lungs. No pleural effusion or pneumothorax. IMPRESSION: No active disease. Electronically Signed   By: Yetta Glassman M.D.   On: 12/04/2020 14:02   ECHOCARDIOGRAM COMPLETE  Result Date: 11/20/2020    ECHOCARDIOGRAM REPORT   Patient Name:   KAMEA DACOSTA Date of Exam: 11/20/2020 Medical Rec #:  [id number]         Height:       61.0 in Accession #:    [id number]        Weight:       199.0 lb Date of Birth:  Sep 02, 1945         BSA:          1.885 m Patient Age:    89 years          BP:           123/49 mmHg Patient Gender: F                 HR:           51 bpm. Exam Location:  [facility] Procedure: 2D Echo, Cardiac Doppler, Color Doppler and Intracardiac            Opacification Agent Indications:    I42.1 HOCM  History:        Patient has prior history of Echocardiogram examinations, most                 recent 03/24/2019. Risk Factors:Dyslipidemia and Hypertension.                  HOCM. Murmur.  Sonographer:    Wilford Sports Rodgers-Jones RDCS Referring Phys: [place]  1. Hypertrophic cardiomyopathy. Systolic anterior motion of the mitral  valve is noted. Significant intracavitary gradient noted. Peak velocity 5.1 m/s. Peak gradient 104.2 mmHg. Left ventricular ejection fraction, by estimation, is >75%. The left ventricle has hyperdynamic function. The left ventricle has no regional wall motion abnormalities. There is severe concentric left ventricular hypertrophy. Left ventricular diastolic parameters are consistent with Grade I diastolic dysfunction (impaired relaxation). Elevated left ventricular end-diastolic pressure.  2. Right ventricular systolic function is normal. The right ventricular size is normal.  3. Left atrial size was severely dilated.  4. The mitral valve is normal in structure. Mild mitral valve regurgitation. No evidence of mitral stenosis.  5. The aortic valve is calcified. There is mild calcification of the aortic valve. There is mild thickening of the aortic valve. Aortic valve regurgitation is not visualized. Mild aortic valve stenosis. Aortic valve mean gradient measures 14.2 mmHg. Aortic valve Vmax measures 2.59 m/s.  6. Aortic dilatation noted. There is mild dilatation of the ascending aorta, measuring 37 mm.  7. The inferior vena cava is normal in size with greater than 50% respiratory variability, suggesting right atrial pressure of 3 mmHg. Conclusion(s)/Recommendation(s): Findings consistent with hypertrophic cardiomyopathy. FINDINGS  Left Ventricle: Hypertrophic cardiomyopathy. Systolic anterior motion of the mitral valve is noted. Significant intracavitary gradient noted. Peak velocity 5.1 m/s. Peak gradient 104.2 mmHg. Left ventricular ejection fraction, by estimation, is >75%. The left ventricle has hyperdynamic function. The left ventricle has no regional wall motion abnormalities. Definity contrast agent was given IV to delineate the  left ventricular endocardial borders. The left ventricular internal cavity size was normal in size. There is severe concentric left ventricular hypertrophy. Left ventricular diastolic parameters are consistent with Grade I diastolic dysfunction (impaired relaxation). Elevated left ventricular end-diastolic pressure. Right Ventricle: The right ventricular size is normal. No increase in right ventricular wall thickness. Right ventricular systolic function is normal. Left Atrium: Left atrial size was severely dilated. Right Atrium: Right atrial size was normal in size. Pericardium: Trivial pericardial effusion is present. Mitral Valve: The mitral valve is normal in structure. Mild mitral valve regurgitation. No evidence of mitral valve stenosis. Tricuspid Valve: The tricuspid valve is normal in structure. Tricuspid valve regurgitation is trivial. No evidence of tricuspid stenosis. Aortic Valve: The aortic valve is calcified. There is mild calcification of the aortic valve. There is mild thickening of the aortic valve. Aortic valve regurgitation is not visualized. Mild aortic stenosis is present. Aortic valve mean gradient measures  14.2 mmHg. Aortic valve peak gradient measures 26.9 mmHg. Pulmonic Valve: The pulmonic valve was normal in structure. Pulmonic valve regurgitation is not visualized. No evidence of pulmonic stenosis. Aorta: Aortic dilatation noted. There is mild dilatation of the ascending aorta, measuring 37 mm. Venous: The inferior vena cava is normal in size with greater than 50% respiratory variability, suggesting right atrial pressure of 3 mmHg. IAS/Shunts: No atrial level shunt detected by color flow Doppler.  LEFT VENTRICLE PLAX 2D LVIDd:         3.60 cm  Diastology LVIDs:         1.30 cm  LV e' medial:    4.03 cm/s LV PW:         2.30 cm  LV E/e' medial:  22.8 LV IVS:        1.90 cm  LV e' lateral:   6.31 cm/s LVOT diam:     2.10 cm  LV E/e' lateral: 14.6 LVOT Area:     3.46 cm  RIGHT VENTRICLE  IVC RV Basal diam:  4.10 cm     IVC diam: 1.30 cm RV S prime:     15.20 cm/s TAPSE (M-mode): 3.7 cm LEFT ATRIUM             Index       RIGHT ATRIUM           Index LA diam:        4.20 cm 2.23 cm/m  RA Area:     11.50 cm LA Vol (A2C):   96.2 ml 51.04 ml/m RA Volume:   27.70 ml  14.70 ml/m LA Vol (A4C):   74.0 ml 39.26 ml/m LA Biplane Vol: 90.4 ml 47.96 ml/m  AORTIC VALVE AV Vmax:      259.26 cm/s AV Vmean:     167.087 cm/s AV VTI:       0.564 m AV Peak Grad: 26.9 mmHg AV Mean Grad: 14.2 mmHg  AORTA Ao Root diam: 3.50 cm Ao Asc diam:  3.70 cm MITRAL VALVE MV Area (PHT): 2.34 cm     SHUNTS MV Decel Time: 324 msec     Systemic Diam: 2.10 cm MV E velocity: 91.90 cm/s MV A velocity: 121.00 cm/s MV E/A ratio:  0.76 Skeet Latch MD Electronically signed by Skeet Latch MD Signature Date/Time: 11/20/2020/3:23:04 PM    Final    IR EMBO ART  VEN HEMORR LYMPH EXTRAV  INC GUIDE ROADMAPPING  Result Date: 12/06/2020 INDICATION: History of chronic intermittent GI bleeding with endoscopy performed earlier today demonstrating acute bleeding at the level of the duodenum. As such, patient presents for mesenteric arteriogram and embolization. EXAM: 1. ULTRASOUND GUIDANCE FOR ARTERIAL ACCESS 2. SELECTIVE CELIAC ARTERIOGRAM 3. SUB SELECTIVE COMMON HEPATIC ARTERIOGRAM 4. SUB SELECTIVE PANCREATICODUODENAL ARTERIOGRAM AND PERCUTANEOUS COIL EMBOLIZATION 5. SUB SELECTIVE GASTRODUODENAL ARTERIOGRAM AND PERCUTANEOUS COIL EMBOLIZATION COMPARISON:  CT abdomen and pelvis-11/14/2019 MEDICATIONS: None ANESTHESIA/SEDATION: Moderate (conscious) sedation was employed during this procedure. A total of Versed 2 mg and Fentanyl 100 mcg was administered intravenously. Moderate Sedation Time: 65 minutes. The patient's level of consciousness and vital signs were monitored continuously by radiology nursing throughout the procedure under my direct supervision. CONTRAST:  70 cc Omnipaque 350 FLUOROSCOPY TIME:  20 minutes, 30 seconds  (6,948 mGy) COMPLICATIONS: None immediate. PROCEDURE: Informed consent was obtained from the patient following explanation of the procedure, risks, benefits and alternatives. All questions were addressed. A time out was performed prior to the initiation of the procedure. Maximal barrier sterile technique utilized including caps, mask, sterile gowns, sterile gloves, large sterile drape, hand hygiene, and Betadine prep. The right femoral head was marked fluoroscopically. Under sterile conditions and local anesthesia, the right common femoral artery access was performed with a micropuncture needle. Under direct ultrasound guidance, the right common femoral was accessed with a micropuncture kit. An ultrasound image was saved for documentation purposes. This allowed for placement of a 5-French vascular sheath. A limited arteriogram was performed through the side arm of the sheath confirming appropriate access within the right common femoral artery. Over a Bentson wire, a Mickelson catheter was advanced the caudal aspect of the thoracic aorta where was reformed, back bled and flushed. The Mickelson catheter was then utilized to select the celiac artery selective celiac arteriogram was performed. Next, with the use of a fathom 16 microwire, a regular Renegade microcatheter was utilized to select the common hepatic artery and a selective common hepatic arteriogram was performed. The microcatheter was then utilized to select a hypertrophied pancreaticoduodenal artery arising from the proximal  aspect of the GDA and a selective pancreaticoduodenal arteriogram was performed. The microcatheter was advanced to its mid/caudal aspect of the pancreaticoduodenal artery and vessel was subsequently percutaneously coil embolized with multiple overlapping 2 mm, 3 mm and 4 mm soft and fibered interlock coils to near the vessel's origin. The microcatheter was then retracted to the level of the gastroduodenal artery and a selective  gastroduodenal arteriogram was performed. The microcatheter was advanced to the distal aspect of the GDA, at the takeoff of the right gastroepiploic artery. Next, the GDA was percutaneously coil embolized with multiple overlapping 5 mm, 6 mm, 8 mm and 10 mm diameter soft and fibered interlock coils. The microcatheter was retracted to the level of the common hepatic artery and post embolization common hepatic arteriograms were performed. Images were reviewed and the procedure was terminated. All wires, catheters and sheaths were removed from the patient. Hemostasis was achieved at the right groin access site with deployment of an ExoSeal closure device and manual compression. The patient tolerated the procedure well without immediate post procedural complication. FINDINGS: Selective celiac arteriogram demonstrates conventional takeoff of the GDA however note is made of a prominent pancreaticoduodenal artery which arises from the proximal GDA. No discrete areas contrast extravasation or vessel irregularity are identified angiographically. Given endoscopic confirmation duodenal bleeding, the decision was made to proceed with prophylactic embolization. Selective pancreaticoduodenal arteriogram confirms the vessel contribute multiple distal tributaries to the descending portion of the duodenum and as such the vessel was successfully percutaneously coil embolized with multiple overlapping coils. Selective gastroduodenal arteriogram demonstrates conventional branching pattern, terminating in a right gastroepiploic artery. The gastroduodenal artery was then successfully percutaneously embolized with multiple overlapping coils to near the origin of the GDA. Completion common hepatic arteriogram demonstrates a technically excellent result with no flow into either the GDA or pancreaticoduodenal arteries. IMPRESSION: Technically successful prophylactic percutaneous coil embolization of the GDA and the pancreaticoduodenal artery  for endoscopically confirmed acute upper GI/duodenal bleeding. PLAN: - The patient is to remain flat for 4 hours with right leg straight. - The patient will continue to experience several additional bloody bowel movements and may continue to require additional resuscitation (as she was bleeding both before the procedure and the endoscopy unit), however ultimately I am hopeful she will stabilize in the coming days muted rest of resuscitation - Repeat endoscopy may be performed at the discretion of the GI service as indicated. Electronically Signed   By: Sandi Mariscal M.D.   On: 12/06/2020 13:28     Subjective: Pt reports feeling well, no black stools, tolerating diet well.  No abdominal pain.   Discharge Exam: Vitals:   12/07/20 0800 12/07/20 1050  BP:    Pulse:    Resp: 15 17  Temp:    SpO2:     Vitals:   12/07/20 0410 12/07/20 0703 12/07/20 0800 12/07/20 1050  BP: (!) 132/47     Pulse:      Resp: _0 Temp: 98 F (36.7 C) (!) 97.5 F (36.4 C)    TempSrc: Oral Oral    SpO2: 95%     Weight:      Height:       General: Pt is alert, awake, not in acute distress Cardiovascular: RRR, S1/S2 +, no rubs, no gallops Respiratory: CTA bilaterally, no wheezing, no rhonchi Abdominal: Soft, NT, ND, bowel sounds + Extremities: no edema, no cyanosis   The results of significant diagnostics from this hospitalization (including imaging, microbiology, ancillary and laboratory)  are listed below for reference.     Microbiology: Recent Results (from the past 240 hour(s))  SARS CORONAVIRUS 2 (TAT 6-24 HRS) Nasopharyngeal Nasopharyngeal Swab     Status: None   Collection Time: 12/04/20  2:30 PM   Specimen: Nasopharyngeal Swab  Result Value Ref Range Status   SARS Coronavirus 2 NEGATIVE NEGATIVE Final    Comment: (NOTE) SARS-CoV-2 target nucleic acids are NOT DETECTED.  The SARS-CoV-2 RNA is generally detectable in upper and lower respiratory specimens during the acute phase of infection.  Negative results do not preclude SARS-CoV-2 infection, do not rule out co-infections with other pathogens, and should not be used as the sole basis for treatment or other patient management decisions. Negative results must be combined with clinical observations, patient history, and epidemiological information. The expected result is Negative.  Fact Sheet for Patients: SugarRoll.be  Fact Sheet for Healthcare Providers: https://www.woods-mathews.com/  This test is not yet approved or cleared by the [place] FDA and  has been authorized for detection and/or diagnosis of SARS-CoV-2 by FDA under an Emergency Use Authorization (EUA). This EUA will remain  in effect (meaning this test can be used) for the duration of the COVID-19 declaration under Se ction 564(b)(1) of the Act, 21 U.S.C. section 360bbb-3(b)(1), unless the authorization is terminated or revoked sooner.  Performed at [facility], [place] [address] [place], [place] [address]   MRSA Next Gen by PCR, Nasal     Status: None   Collection Time: 12/05/20  8:37 PM   Specimen: Nasal Mucosa; Nasal Swab  Result Value Ref Range Status   MRSA by PCR Next Gen NOT DETECTED NOT DETECTED Final    Comment: (NOTE) The GeneXpert MRSA Assay (FDA approved for NASAL specimens only), is one component of a comprehensive MRSA colonization surveillance program. It is not intended to diagnose MRSA infection nor to guide or monitor treatment for MRSA infections. Test performance is not FDA approved in patients less than 66 years old. Performed at [facility], [address] [place], [place] [id number]      Labs: BNP (last 3 results) No results for input(s): BNP in the last 8760 hours. Basic Metabolic Panel: Recent Labs  Lab 12/04/20 1223 12/05/20 0449 12/06/20 0438  NA 137 136 137  K 3.6 3.6 3.5  CL 107 107 106  CO2 _0 GLUCOSE 122* 101* 91  BUN 27* 21 19  CREATININE 0.63 0.66  0.62  CALCIUM 8.6* 7.8* 8.2*  MG  --  1.8 1.8   Liver Function Tests: Recent Labs  Lab 12/04/20 1223  AST 24  ALT 18  ALKPHOS 25*  BILITOT 0.5  PROT 5.5*  ALBUMIN 3.6   No results for input(s): LIPASE, AMYLASE in the last 168 hours. No results for input(s): AMMONIA in the last 168 hours. CBC: Recent Labs  Lab 12/04/20 1223 12/05/20 0449 12/05/20 2132 12/06/20 0438 12/06/20 1439 12/06/20 2314 12/07/20 0437  WBC 9.0 5.8  --  8.3  --   --  7.4  HGB 7.6* 8.3* 7.8* 7.5* 7.0* 7.5* 7.6*  HCT 24.3* 25.4* 23.6* 23.2* 22.0* 23.0* 23.3*  MCV 99.2 95.5  --  97.1  --   --  97.5  PLT 205 149*  --  165  --   --  157   Cardiac Enzymes: No results for input(s): CKTOTAL, CKMB, CKMBINDEX, TROPONINI in the last 168 hours. BNP: Invalid input(s): POCBNP CBG: No results for input(s): GLUCAP in the last 168 hours.  D-Dimer No results for input(s): DDIMER in the last 72 hours. Hgb A1c No results for input(s): HGBA1C in the last 72 hours. Lipid Profile No results for input(s): CHOL, HDL, LDLCALC, TRIG, CHOLHDL, LDLDIRECT in the last 72 hours. Thyroid function studies No results for input(s): TSH, T4TOTAL, T3FREE, THYROIDAB in the last 72 hours.  Invalid input(s): FREET3 Anemia work up No results for input(s): VITAMINB12, FOLATE, FERRITIN, TIBC, IRON, RETICCTPCT in the last 72 hours. Urinalysis    Component Value Date/Time   COLORURINE YELLOW 11/14/2019 0851   APPEARANCEUR Clear 10/19/2020 1215   LABSPEC 1.015 11/14/2019 0851   PHURINE 8.0 11/14/2019 0851   GLUCOSEU Negative 10/19/2020 1215   HGBUR NEGATIVE 11/14/2019 0851   BILIRUBINUR Negative 10/19/2020 1215   KETONESUR NEGATIVE 11/14/2019 0851   PROTEINUR Negative 10/19/2020 1215   PROTEINUR NEGATIVE 11/14/2019 0851   UROBILINOGEN negative 02/24/2015 1344   NITRITE Negative 10/19/2020 1215   NITRITE NEGATIVE 11/14/2019 0851   LEUKOCYTESUR Negative 10/19/2020 1215   LEUKOCYTESUR TRACE (A) 11/14/2019 0851   Sepsis  Labs Invalid input(s): PROCALCITONIN,  WBC,  LACTICIDVEN Microbiology Recent Results (from the past 240 hour(s))  SARS CORONAVIRUS 2 (TAT 6-24 HRS) Nasopharyngeal Nasopharyngeal Swab     Status: None   Collection Time: 12/04/20  2:30 PM   Specimen: Nasopharyngeal Swab  Result Value Ref Range Status   SARS Coronavirus 2 NEGATIVE NEGATIVE Final    Comment: (NOTE) SARS-CoV-2 target nucleic acids are NOT DETECTED.  The SARS-CoV-2 RNA is generally detectable in upper and lower respiratory specimens during the acute phase of infection. Negative results do not preclude SARS-CoV-2 infection, do not rule out co-infections with other pathogens, and should not be used as the sole basis for treatment or other patient management decisions. Negative results must be combined with clinical observations, patient history, and epidemiological information. The expected result is Negative.  Fact Sheet for Patients: SugarRoll.be  Fact Sheet for Healthcare Providers: https://www.woods-mathews.com/  This test is not yet approved or cleared by the [place] FDA and  has been authorized for detection and/or diagnosis of SARS-CoV-2 by FDA under an Emergency Use Authorization (EUA). This EUA will remain  in effect (meaning this test can be used) for the duration of the COVID-19 declaration under Se ction 564(b)(1) of the Act, 21 U.S.C. section 360bbb-3(b)(1), unless the authorization is terminated or revoked sooner.  Performed at [facility], [place] [address] [place], [place] [address]   MRSA Next Gen by PCR, Nasal     Status: None   Collection Time: 12/05/20  8:37 PM   Specimen: Nasal Mucosa; Nasal Swab  Result Value Ref Range Status   MRSA by PCR Next Gen NOT DETECTED NOT DETECTED Final    Comment: (NOTE) The GeneXpert MRSA Assay (FDA approved for NASAL specimens only), is one component of a comprehensive MRSA colonization surveillance program. It  is not intended to diagnose MRSA infection nor to guide or monitor treatment for MRSA infections. Test performance is not FDA approved in patients less than 75 years old. Performed at [facility], [address], [place], [place] [address]     Time coordinating discharge: 35 mins   SIGNED:  Irwin Brakeman, MD  [facility] 12/07/2020, 11:05 AM How to contact the [facility] Attending or Consulting provider [place] or covering provider during after hours [place], for this patient?  Check the care team in [facility] and look for a) attending/consulting [facility] provider listed and b) the [facility] team listed Log into  [email or web address] and use [place]'s universal password to access. If you do not have the password, please contact the hospital operator. Locate the [facility] provider you are looking for under [facility] and page to a number that you can be directly reached. If you still have difficulty reaching the provider, please page the [facility] (Director on Call) for the Hospitalists listed on amion for assistance.

## 2020-12-08 ENCOUNTER — Telehealth: Payer: Self-pay

## 2020-12-08 ENCOUNTER — Telehealth: Payer: Self-pay | Admitting: Family Medicine

## 2020-12-08 LAB — BPAM RBC
Blood Product Expiration Date: 202210132359
Blood Product Expiration Date: 202210162359
Blood Product Expiration Date: 202210162359
Blood Product Expiration Date: 202210172359
ISSUE DATE / TIME: 202209121441
ISSUE DATE / TIME: 202209121648
ISSUE DATE / TIME: 202209141805
Unit Type and Rh: 5100
Unit Type and Rh: 5100
Unit Type and Rh: 5100
Unit Type and Rh: 5100

## 2020-12-08 LAB — TYPE AND SCREEN
ABO/RH(D): O POS
Antibody Screen: NEGATIVE
Unit division: 0
Unit division: 0
Unit division: 0
Unit division: 0

## 2020-12-08 NOTE — Telephone Encounter (Signed)
Appt scheduled

## 2020-12-08 NOTE — Telephone Encounter (Signed)
Transition Care Management Unsuccessful Follow-up Telephone Call  Date of discharge and from where:  12/07/20  Diagnosis:  Anemia, GI Bleed   Attempts:  1st Attempt  Reason for unsuccessful TCM follow-up call:  Left voice message

## 2020-12-08 NOTE — Telephone Encounter (Signed)
Please call patient to schedule hospital follow up with Dr Lajuana Ripple.

## 2020-12-10 ENCOUNTER — Emergency Department (HOSPITAL_COMMUNITY): Payer: Medicare Other

## 2020-12-10 ENCOUNTER — Other Ambulatory Visit: Payer: Self-pay

## 2020-12-10 ENCOUNTER — Encounter (HOSPITAL_COMMUNITY): Payer: Self-pay | Admitting: *Deleted

## 2020-12-10 ENCOUNTER — Inpatient Hospital Stay (HOSPITAL_COMMUNITY)
Admission: EM | Admit: 2020-12-10 | Discharge: 2020-12-15 | Disposition: A | Discharge status: Home / Self Care | Payer: Medicare Other | Source: Home / Self Care | Attending: Pulmonary Disease | Admitting: Pulmonary Disease

## 2020-12-10 DIAGNOSIS — K219 Gastro-esophageal reflux disease without esophagitis: Secondary | ICD-10-CM | POA: Diagnosis present

## 2020-12-10 DIAGNOSIS — Z20822 Contact with and (suspected) exposure to covid-19: Secondary | ICD-10-CM | POA: Diagnosis present

## 2020-12-10 DIAGNOSIS — Z8719 Personal history of other diseases of the digestive system: Secondary | ICD-10-CM

## 2020-12-10 DIAGNOSIS — I340 Nonrheumatic mitral (valve) insufficiency: Secondary | ICD-10-CM | POA: Diagnosis not present

## 2020-12-10 DIAGNOSIS — I472 Ventricular tachycardia: Secondary | ICD-10-CM | POA: Diagnosis not present

## 2020-12-10 DIAGNOSIS — I700 Atherosclerosis of aorta: Secondary | ICD-10-CM | POA: Diagnosis not present

## 2020-12-10 DIAGNOSIS — Z87891 Personal history of nicotine dependence: Secondary | ICD-10-CM

## 2020-12-10 DIAGNOSIS — I248 Other forms of acute ischemic heart disease: Secondary | ICD-10-CM | POA: Diagnosis not present

## 2020-12-10 DIAGNOSIS — K3182 Dieulafoy lesion (hemorrhagic) of stomach and duodenum: Secondary | ICD-10-CM | POA: Diagnosis not present

## 2020-12-10 DIAGNOSIS — I729 Aneurysm of unspecified site: Secondary | ICD-10-CM

## 2020-12-10 DIAGNOSIS — Z882 Allergy status to sulfonamides status: Secondary | ICD-10-CM

## 2020-12-10 DIAGNOSIS — K31811 Angiodysplasia of stomach and duodenum with bleeding: Secondary | ICD-10-CM | POA: Diagnosis present

## 2020-12-10 DIAGNOSIS — D62 Acute posthemorrhagic anemia: Secondary | ICD-10-CM | POA: Diagnosis present

## 2020-12-10 DIAGNOSIS — I10 Essential (primary) hypertension: Secondary | ICD-10-CM | POA: Diagnosis present

## 2020-12-10 DIAGNOSIS — Z79899 Other long term (current) drug therapy: Secondary | ICD-10-CM

## 2020-12-10 DIAGNOSIS — K921 Melena: Secondary | ICD-10-CM | POA: Diagnosis not present

## 2020-12-10 DIAGNOSIS — I2510 Atherosclerotic heart disease of native coronary artery without angina pectoris: Secondary | ICD-10-CM | POA: Diagnosis present

## 2020-12-10 DIAGNOSIS — D649 Anemia, unspecified: Secondary | ICD-10-CM | POA: Diagnosis not present

## 2020-12-10 DIAGNOSIS — Z800 Family history of malignant neoplasm of digestive organs: Secondary | ICD-10-CM

## 2020-12-10 DIAGNOSIS — K922 Gastrointestinal hemorrhage, unspecified: Secondary | ICD-10-CM

## 2020-12-10 DIAGNOSIS — I724 Aneurysm of artery of lower extremity: Secondary | ICD-10-CM | POA: Diagnosis present

## 2020-12-10 DIAGNOSIS — E669 Obesity, unspecified: Secondary | ICD-10-CM | POA: Diagnosis present

## 2020-12-10 DIAGNOSIS — R0602 Shortness of breath: Secondary | ICD-10-CM | POA: Diagnosis not present

## 2020-12-10 DIAGNOSIS — E785 Hyperlipidemia, unspecified: Secondary | ICD-10-CM | POA: Diagnosis present

## 2020-12-10 DIAGNOSIS — Z6831 Body mass index (BMI) 31.0-31.9, adult: Secondary | ICD-10-CM

## 2020-12-10 DIAGNOSIS — I422 Other hypertrophic cardiomyopathy: Secondary | ICD-10-CM

## 2020-12-10 DIAGNOSIS — Z86010 Personal history of colonic polyps: Secondary | ICD-10-CM

## 2020-12-10 DIAGNOSIS — K5731 Diverticulosis of large intestine without perforation or abscess with bleeding: Secondary | ICD-10-CM | POA: Diagnosis not present

## 2020-12-10 DIAGNOSIS — K5900 Constipation, unspecified: Secondary | ICD-10-CM | POA: Diagnosis present

## 2020-12-10 DIAGNOSIS — Z9889 Other specified postprocedural states: Secondary | ICD-10-CM | POA: Diagnosis not present

## 2020-12-10 DIAGNOSIS — R531 Weakness: Secondary | ICD-10-CM | POA: Diagnosis not present

## 2020-12-10 HISTORY — DX: Encounter for other specified aftercare: Z51.89

## 2020-12-10 LAB — HEMOGLOBIN AND HEMATOCRIT, BLOOD
HCT: 20 % — ABNORMAL LOW (ref 36.0–46.0)
Hemoglobin: 6.4 g/dL — CL (ref 12.0–15.0)

## 2020-12-10 LAB — COMPREHENSIVE METABOLIC PANEL
ALT: 14 U/L (ref 0–44)
AST: 20 U/L (ref 15–41)
Albumin: 3.1 g/dL — ABNORMAL LOW (ref 3.5–5.0)
Alkaline Phosphatase: 22 U/L — ABNORMAL LOW (ref 38–126)
Anion gap: 5 (ref 5–15)
BUN: 20 mg/dL (ref 8–23)
CO2: 25 mmol/L (ref 22–32)
Calcium: 7.9 mg/dL — ABNORMAL LOW (ref 8.9–10.3)
Chloride: 106 mmol/L (ref 98–111)
Creatinine, Ser: 0.61 mg/dL (ref 0.44–1.00)
GFR, Estimated: >60 mL/min (ref >60)
Glucose, Bld: 119 mg/dL — ABNORMAL HIGH (ref 70–99)
Potassium: 3.9 mmol/L (ref 3.5–5.1)
Sodium: 136 mmol/L (ref 135–145)
Total Bilirubin: 0.6 mg/dL (ref 0.3–1.2)
Total Protein: 5.6 g/dL — ABNORMAL LOW (ref 6.5–8.1)

## 2020-12-10 LAB — CBC WITH DIFFERENTIAL/PLATELET
Abs Immature Granulocytes: 0.04 10*3/uL (ref 0.00–0.07)
Basophils Absolute: 0.1 10*3/uL (ref 0.0–0.1)
Basophils Relative: 1 %
Eosinophils Absolute: 0.1 10*3/uL (ref 0.0–0.5)
Eosinophils Relative: 2 %
HCT: 18 % — ABNORMAL LOW (ref 36.0–46.0)
Hemoglobin: 5.6 g/dL — CL (ref 12.0–15.0)
Immature Granulocytes: 1 %
Lymphocytes Relative: 22 %
Lymphs Abs: 1.9 10*3/uL (ref 0.7–4.0)
MCH: 30.9 pg (ref 26.0–34.0)
MCHC: 31.1 g/dL (ref 30.0–36.0)
MCV: 99.4 fL (ref 80.0–100.0)
Monocytes Absolute: 0.6 10*3/uL (ref 0.1–1.0)
Monocytes Relative: 7 %
Neutro Abs: 6 10*3/uL (ref 1.7–7.7)
Neutrophils Relative %: 67 %
Platelets: 238 10*3/uL (ref 150–400)
RBC: 1.81 MIL/uL — ABNORMAL LOW (ref 3.87–5.11)
RDW: 15.5 % (ref 11.5–15.5)
WBC: 8.7 10*3/uL (ref 4.0–10.5)
nRBC: 0 % (ref 0.0–0.2)

## 2020-12-10 LAB — PROTIME-INR
INR: 1.1 (ref 0.8–1.2)
Prothrombin Time: 14.2 seconds (ref 11.4–15.2)

## 2020-12-10 LAB — PREPARE RBC (CROSSMATCH)

## 2020-12-10 LAB — POC OCCULT BLOOD, ED: Fecal Occult Bld: POSITIVE — AB

## 2020-12-10 MED ORDER — PANTOPRAZOLE SODIUM 40 MG IV SOLR
40.0000 mg | Freq: Two times a day (BID) | INTRAVENOUS | Status: DC
  Administered 2020-12-10 – 2020-12-14 (×7): 40 mg via INTRAVENOUS
  Filled 2020-12-10 (×7): qty 40

## 2020-12-10 MED ORDER — ONDANSETRON HCL 4 MG PO TABS: 4.0000 mg | ORAL_TABLET | Freq: Four times a day (QID) | ORAL | Status: DC | PRN

## 2020-12-10 MED ORDER — PANTOPRAZOLE SODIUM 40 MG IV SOLR
40.0000 mg | Freq: Once | INTRAVENOUS | Status: DC
  Filled 2020-12-10: qty 40

## 2020-12-10 MED ORDER — ACETAMINOPHEN 325 MG PO TABS
650.0000 mg | ORAL_TABLET | Freq: Four times a day (QID) | ORAL | Status: DC | PRN
  Administered 2020-12-10 – 2020-12-14 (×7): 650 mg via ORAL
  Filled 2020-12-10 (×6): qty 2

## 2020-12-10 MED ORDER — SODIUM CHLORIDE 0.9 % IV BOLUS
1000.0000 mL | Freq: Once | INTRAVENOUS | Status: AC
  Administered 2020-12-10: 1000 mL via INTRAVENOUS

## 2020-12-10 MED ORDER — ACETAMINOPHEN 500 MG PO TABS
1000.0000 mg | ORAL_TABLET | Freq: Once | ORAL | Status: AC
  Administered 2020-12-10: 1000 mg via ORAL
  Filled 2020-12-10: qty 2

## 2020-12-10 MED ORDER — MELATONIN 3 MG PO TABS
6.0000 mg | ORAL_TABLET | Freq: Every day | ORAL | Status: DC
  Administered 2020-12-10 – 2020-12-14 (×5): 6 mg via ORAL
  Filled 2020-12-10 (×5): qty 2

## 2020-12-10 MED ORDER — ROSUVASTATIN CALCIUM 5 MG PO TABS
10.0000 mg | ORAL_TABLET | Freq: Every day | ORAL | Status: DC
  Administered 2020-12-11 – 2020-12-14 (×4): 10 mg via ORAL
  Filled 2020-12-10 (×2): qty 2
  Filled 2020-12-10: qty 1
  Filled 2020-12-10: qty 2

## 2020-12-10 MED ORDER — IOHEXOL 350 MG/ML SOLN
100.0000 mL | Freq: Once | INTRAVENOUS | Status: AC | PRN
  Administered 2020-12-10: 100 mL via INTRAVENOUS

## 2020-12-10 MED ORDER — ONDANSETRON HCL 4 MG/2ML IJ SOLN
4.0000 mg | Freq: Four times a day (QID) | INTRAMUSCULAR | Status: DC | PRN
  Administered 2020-12-12: 4 mg via INTRAVENOUS
  Filled 2020-12-10: qty 2

## 2020-12-10 MED ORDER — SODIUM CHLORIDE 0.9 % IV SOLN: 10.0000 mL/h | Freq: Once | INTRAVENOUS | Status: DC

## 2020-12-10 MED ORDER — ACETAMINOPHEN 650 MG RE SUPP: 650.0000 mg | Freq: Four times a day (QID) | RECTAL | Status: DC | PRN

## 2020-12-10 MED ORDER — POLYETHYLENE GLYCOL 3350 17 G PO PACK: 17.0000 g | PACK | Freq: Every day | ORAL | Status: DC | PRN

## 2020-12-10 MED ORDER — MELATONIN 5 MG PO TABS
5.0000 mg | ORAL_TABLET | Freq: Every day | ORAL | Status: DC
  Filled 2020-12-10: qty 1

## 2020-12-10 NOTE — H&P (Addendum)
History and Physical    Stacey EVEN [id number] DOB: Jan 09, 1946 DOA: 12/10/2020  PCP: [name], DO   Patient coming from: Home  I have personally briefly reviewed patient'Reyes old medical records in [place]  Chief Complaint: Weakness  HPI: Stacey Reyes is a 75 y.o. female with medical history significant for  GI bleed, HTN, hypertrophic cardiomyopathy. Patient presented to the ED with c/o generalized weakness and difficulty breathing with activity, and dizziness when standing.  She reports her extremities feeling like jelly when she exerts herself.   Recently hospitalized 9/12- 9/15 for Acute on chronic blood loss anemia likely due to bleeding Dieulafoy lesion or diverticulum. Transfused with 4u of blood. EGD- showed Blood in the second portion of the duodenum; Dieulafoy suspected. Use of duodenoscope did not help in localizing bleeding site, hence she was transferred to [facility] angiogram with embolization by IR.  Patient reports since she has been discharged, all of her bowel movements daily have looked like blood.  She has not seen any blood in her stool.  No vomiting , no abdominal pain.  Takes Tylenol only for pain, denies NSAID use.  ED Course: Blood pressure systolic [id number] O2 sats percent on room air.  Hemoglobin 5.6.  Chest x-ray without acute abnormality.  He needs PRBC ordered for transfusion. EDP consulted gastroenterologist Dr. Abbey Chatters, CTA was recommended, which did not reveal source of bleed. Hospitalist to admit  Review of Systems: As per HPI all other systems reviewed and negative.  Past Medical History:  Diagnosis Date   Anemia    Blood transfusion without reported diagnosis    Cataract    removed bilateral   Chronic cystitis    Diverticulitis    Heart murmur    Hyperlipidemia    Hypertension    Personal history of colonic polyps-adenomas 01/07/2012   2009 - 2 diminutive adenomas (prior polyps also) 01/07/2012 - 2 diminutive  adenomas      Past Surgical History:  Procedure Laterality Date   BREAST BIOPSY Right    No Scar seen    COLONOSCOPY  multiple   ESOPHAGOGASTRODUODENOSCOPY (EGD) WITH PROPOFOL N/A 11/23/2019   Procedure: ESOPHAGOGASTRODUODENOSCOPY (EGD) WITH PROPOFOL;  Surgeon: Daneil Dolin, MD;  [facility];  Service: Endoscopy;  Laterality: N/A;   ESOPHAGOGASTRODUODENOSCOPY (EGD) WITH PROPOFOL N/A 10/20/2020   Procedure: ESOPHAGOGASTRODUODENOSCOPY (EGD) WITH PROPOFOL;  Surgeon: Eloise Harman, DO;  [facility];  Service: Endoscopy;  Laterality: N/A;   ESOPHAGOGASTRODUODENOSCOPY (EGD) WITH PROPOFOL N/A 12/05/2020   Procedure: ESOPHAGOGASTRODUODENOSCOPY (EGD) WITH PROPOFOL;  Surgeon: Rogene Houston, MD;  [facility];  Service: Endoscopy;  Laterality: N/A;  with enteroscopy   GIVENS CAPSULE STUDY N/A 11/23/2019   Procedure: GIVENS CAPSULE STUDY;  Surgeon: Daneil Dolin, MD;  [facility];  Service: Endoscopy;  Laterality: N/A;   GIVENS CAPSULE STUDY N/A 10/22/2020   Procedure: GIVENS CAPSULE STUDY;  Surgeon: Eloise Harman, DO;  [facility];  Service: Endoscopy;  Laterality: N/A;   IR ANGIOGRAM SELECTIVE EACH ADDITIONAL VESSEL  12/05/2020   IR ANGIOGRAM SELECTIVE EACH ADDITIONAL VESSEL  12/05/2020   IR ANGIOGRAM SELECTIVE EACH ADDITIONAL VESSEL  12/05/2020   IR ANGIOGRAM VISCERAL SELECTIVE  12/05/2020   IR EMBO ART  VEN HEMORR LYMPH EXTRAV  INC GUIDE ROADMAPPING  12/05/2020   IR US GUIDE VASC ACCESS RIGHT  12/05/2020     reports that she quit smoking about 35 years ago. Her smoking use  included cigarettes. She has never used smokeless tobacco. She reports current alcohol use. She reports that she does not use drugs.  Allergies  Allergen Reactions   Sulfa Antibiotics Rash    Family History  Problem Relation Age of Onset   Colon cancer Mother 68       79'Reyes   Breast cancer Sister    Asthma Brother    Colon polyps Neg Hx    Esophageal  cancer Neg Hx    Rectal cancer Neg Hx    Stomach cancer Neg Hx     Prior to Admission medications   Medication Sig Start Date End Date Taking? Authorizing Provider  Ascorbic Acid (VITAMIN C) 1000 MG tablet Take 1,000 mg by mouth daily.   Yes [provider]  bisoprolol (ZEBETA) 5 MG tablet Take 0.5 tablets (2.5 mg total) by mouth daily. 10/27/20  Yes Lendon Colonel, NP  clobetasol ointment (TEMOVATE) [id number] % Apply 1 application topically 2 (two) times daily as needed (skin). 10/26/20  Yes [provider]  Copper Gluconate (COPPER CAPS PO) Take 1 capsule by mouth daily.    Yes [provider]  cyanocobalamin ([employer] VITAMIN B12) 2000 MCG tablet Take 1 tablet (2,000 mcg total) by mouth daily. 03/25/19  Yes Johnson, Clanford L, MD  Magnesium 200 MG TABS Take 1 tablet (200 mg total) by mouth daily. 03/23/19  Yes Lendon Colonel, NP  Multiple Minerals-Vitamins (CALCIUM-MAGNESIUM-ZINC-D3 PO) Take 1 tablet by mouth daily.   Yes [provider]  Multiple Vitamin (MULTIVITAMIN) capsule Take 1 capsule by mouth daily.    Yes [provider]  pantoprazole (PROTONIX) 40 MG tablet 1 po BID x 8 weeks, then 1 po daily Patient taking differently: Take 40 mg by mouth See admin instructions. Starting 9.15.22, take 1 tablet by mouth twice daily for 8 weeks, then 1 tablet by mouth once daily. 12/07/20  Yes Johnson, Clanford L, MD  rosuvastatin (CRESTOR) 10 MG tablet TAKE 1 TABLET BY MOUTH AT  BEDTIME Patient taking differently: Take 10 mg by mouth daily. 04/12/20  Yes Ronnie Doss M, DO  vitamin E 400 UNIT capsule Take 400 Units by mouth daily.    Yes [provider]    Physical Exam: Vitals:   12/10/20 1400 12/10/20 1415 12/10/20 1430 12/10/20 1442  BP: 98/80 (!) 121/45 (!) 94/56 (!) 118/56  Pulse: 86 83 85 84  Resp: '15 15 13 18  '$ Temp:    98.1 F (36.7 C)  TempSrc:    Oral  SpO2: 97% 97% 99% 97%    Constitutional: NAD, calm, comfortable Vitals:    12/10/20 1400 12/10/20 1415 12/10/20 1430 12/10/20 1442  BP: 98/80 (!) 121/45 (!) 94/56 (!) 118/56  Pulse: 86 83 85 84  Resp: '15 15 13 18  '$ Temp:    98.1 F (36.7 C)  TempSrc:    Oral  SpO2: 97% 97% 99% 97%   Eyes:  lids and conjunctivae normal ENMT: Mucous membranes are dry.  Neck: normal, no masses, no thyromegaly Respiratory: clear to auscultation bilaterally, no wheezing, no crackles. Normal respiratory effort. No accessory muscle use.  Cardiovascular: Regular rate and rhythm, known 3/6  murmur / rubs / gallops. No extremity edema. 2+ pedal pulses.   Abdomen: no tenderness, no masses palpated. No hepatosplenomegaly. Bowel sounds positive.  Musculoskeletal: no clubbing / cyanosis. No joint deformity upper and lower extremities. Skin: no rashes, lesions, ulcers. No induration Neurologic: no apparent cranial nerve abnormality, moving extremities spontaneously Psychiatric: Normal judgment and  insight. Alert and oriented x 3. Normal mood.   Labs on Admission: I have personally reviewed following labs and imaging studies  CBC: Recent Labs  Lab 12/04/20 1223 12/05/20 0449 12/05/20 2132 12/06/20 0438 12/06/20 1439 12/06/20 2314 12/07/20 0437 12/10/20 1120  WBC 9.0 5.8  --  8.3  --   --  7.4 8.7  NEUTROABS  --   --   --   --   --   --   --  6.0  HGB 7.6* 8.3*   < > 7.5* 7.0* 7.5* 7.6* 5.6*  HCT 24.3* 25.4*   < > 23.2* 22.0* 23.0* 23.3* 18.0*  MCV 99.2 95.5  --  97.1  --   --  97.5 99.4  PLT 205 149*  --  165  --   --  157 238   < > = values in this interval not displayed.   Basic Metabolic Panel: Recent Labs  Lab 12/04/20 1223 12/05/20 0449 12/06/20 0438 12/10/20 1120  NA 137 136 137 136  K 3.6 3.6 3.5 3.9  CL 107 107 106 106  CO2 '24 24 26 25  '$ GLUCOSE 122* 101* 91 119*  BUN 27* '21 19 20  '$ CREATININE 0.63 0.66 0.62 0.61  CALCIUM 8.6* 7.8* 8.2* 7.9*  MG  --  1.8 1.8  --    GFR: Estimated Creatinine Clearance: 67.9 mL/min (by C-G formula based on SCr of 0.61  mg/dL). Liver Function Tests: Recent Labs  Lab 12/04/20 1223 12/10/20 1120  AST 24 20  ALT 18 14  ALKPHOS 25* 22*  BILITOT 0.5 0.6  PROT 5.5* 5.6*  ALBUMIN 3.6 3.1*   Coagulation Profile: Recent Labs  Lab 12/10/20 1120  INR 1.1   BNP (last 3 results) Recent Labs    10/19/20 1244 10/25/20 1112  PROBNP 2,974* 1,440*    Radiological Exams on Admission: DG Chest Port 1 View  Result Date: 12/10/2020 CLINICAL DATA:  Shortness of breath and weakness over the last 4 days EXAM: PORTABLE CHEST 1 VIEW COMPARISON:  12/04/2020 FINDINGS: Stable mild to moderate enlargement of the cardiopericardial silhouette, without edema. Atherosclerotic calcification of the aortic arch. Thoracic spondylosis. No blunting of the costophrenic angles. Mild degenerative glenohumeral arthropathy bilaterally. IMPRESSION: 1. Stable mild to moderate enlargement of the cardiopericardial silhouette, without edema. 2.  Aortic Atherosclerosis (ICD10-I70.0). 3. Thoracic spondylosis. Electronically Signed   By: Van Clines M.D.   On: 12/10/2020 12:06   CT Angio Abd/Pel W and/or Wo Contrast  Result Date: 12/10/2020 CLINICAL DATA:  GI bleed, weakness EXAM: CTA ABDOMEN AND PELVIS WITHOUT AND WITH CONTRAST TECHNIQUE: Multidetector CT imaging of the abdomen and pelvis was performed using the standard protocol during bolus administration of intravenous contrast. Multiplanar reconstructed images and MIPs were obtained and reviewed to evaluate the vascular anatomy. CONTRAST:  169m OMNIPAQUE IOHEXOL 350 MG/ML SOLN COMPARISON:  CT abdomen pelvis, 11/14/2019, interventional angiogram, 12/05/2020 FINDINGS: VASCULAR Normal contour and caliber of the abdominal aorta. No evidence of aortic aneurysm, dissection, or other acute aortic pathology. Moderate mixed calcific atherosclerosis. Standard branching pattern of the abdominal aorta, with solitary bilateral renal arteries. The branch vessel origins are patent. Coil material within  the pancreaticoduodenal and gastroduodenal arteries. 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 Review of the MIP images confirms the above findings. NON-VASCULAR Lower chest: No acute abnormality.  Coronary artery calcifications. Hepatobiliary: No solid liver abnormality is  seen. No gallstones, gallbladder wall thickening, or biliary dilatation. Pancreas: Unremarkable. No pancreatic ductal dilatation or surrounding inflammatory changes. Spleen: Normal in size without significant abnormality. Adrenals/Urinary Tract: Adrenal glands are unremarkable. Kidneys are normal, without renal calculi, solid lesion, or hydronephrosis. Bladder is unremarkable. Stomach/Bowel: Stomach is within normal limits. Appendix appears normal. No evidence of bowel wall thickening, distention, or inflammatory changes. Lymphatic: No enlarged abdominal or pelvic lymph nodes. Reproductive: No mass or other significant abnormality. Other: No abdominal wall hernia or abnormality. No abdominopelvic ascites. Musculoskeletal: No acute or significant osseous findings. IMPRESSION: 1. Normal contour and caliber of the abdominal aorta. No evidence of aortic aneurysm, dissection, or other acute aortic pathology. Moderate mixed calcific atherosclerosis. 2. 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 3. No contrast extravasation within the bowel lumen. 4. Coil material within the pancreaticoduodenal and gastroduodenal arteries, per recent coiling procedure. 5. Coronary artery disease. Aortic Atherosclerosis (ICD10-I70.0). Electronically Signed   By: Eddie Candle M.D.   On: 12/10/2020 13:25    EKG: Independently reviewed.  Sinus rhythm rate 94.  QTc 473.  ST depressions in aVL, these are old and unchanged from prior  EKG.  Assessment/Plan Principal Problem:   Acute blood loss anemia Active Problems:   Hypertension, essential   Cardiac murmur due to mitral valve disorder   Hypertrophic cardiomyopathy (HCC)    Symptomatic Acute blood loss anemia hgb 5.6, discharge hemoglobin 2 days ago was 7.6. Presenting with melenotic home, without hematemesis, or hematochezia, no NSAID use.  Recent hospitalization for same, GI bleed deemed 2/2 Dieulafoy lesion or diverticulum, transferred to [facility] for IR coil embolization- 9/14.  Today CTA abdomen pelvis not revealing for bleeding source. - 2 U PRBC ordered -Monitor hemoglobin closely - EDP talked to Dr Abbey Chatters- will see in a.m - IV protonix 40 BID -Clear liquid diet, and p.o. midnight -1 L bolus given  Cardiomyopathy, cardiac murmur, history of mitral valve disorder. -Hold bisoprolol  HTN-blood pressure soft but stable. -Hold bisoprolol.   Dyslipidemia - resume Crestor  DVT prophylaxis: SCDS Code Status: Full code Family Communication: None at bedside Disposition Plan: ~ 2 days Consults called: GI, Dr. Abbey Chatters Admission status: Obs, tele   Bethena Roys MD [facility]  12/10/2020, 5:10 PM

## 2020-12-10 NOTE — ED Notes (Signed)
X-ray at bedside

## 2020-12-10 NOTE — ED Provider Notes (Signed)
[facility] EMERGENCY DEPARTMENT Provider Note   CSN: [id number] Arrival date & time: 12/10/20  1053     History Chief Complaint  Patient presents with   Shortness of Breath    Stacey Reyes is a 75 y.o. female.  HPI  Patient with an extensive medical history including hypertension, hyperlipidemia, GERD, chronic anemia likely due to blood loss AVM, hypertrophic cardiomyopathy hypertension GI bleeds presents with chief complaint of shortness of breath and feeling fatigued.  Patient states it has gotten worse for a week's time states that she feels short of breath on exertion, will have chest pain as well as paresthesias in her upper lower extremities.  Patient states when she stops moving all of her symptoms resolved.  Patient states that chest pain remains in the center of her chest does not radiate, not associate with becoming diaphoretic, nausea or vomiting.  She endorses that she has felt more dizzy especially when she goes from a sitting to standing position, she denies worsening pedal edema or orthopnea.  She also notes that she is been unable to have a bowel movement since yesterday, states her last bowel movement was jet black and she is concerned that she might have another GI bleed.  She denies any alleviating factors at this time.  After reviewing patient's chart patient was recently discharged from the hospital on 09/15 she was admitted for shortness of breath and fatigue, noted that she had a GI bleed requiring 3 units of blood, she underwent embolization of the GPA which they suspect was causing her chronic GI bleed.  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  Patient was started on Protonix as well as follow-up with GI for further evaluation.  Past Medical History:  Diagnosis Date   Anemia    Blood transfusion without reported diagnosis    Cataract    removed bilateral   Chronic cystitis    Diverticulitis    Heart murmur     Hyperlipidemia    Hypertension    Personal history of colonic polyps-adenomas 01/07/2012   2009 - 2 diminutive adenomas (prior polyps also) 01/07/2012 - 2 diminutive adenomas      Patient Active Problem List   Diagnosis Date Noted   Melena    Elevated troponin 10/20/2020   Acute GI bleeding 10/20/2020   Symptomatic anemia 10/19/2020   Morning headache 03/08/2020   Hypertrophic cardiomyopathy ([place]) 01/17/2020   Iron deficiency anemia 12/21/2019   Need for immunization against influenza 12/14/2019   Acute blood loss anemia 11/22/2019   Educated about COVID-19 virus infection 07/05/2019   Dyspnea on exertion 07/05/2019   B12 deficiency 03/25/2019   Absolute anemia 03/24/2019   Osteopenia after menopause 04/22/2018   Cardiac murmur due to mitral valve disorder 03/03/2018   Heart murmur 03/03/2018   LVH (left ventricular hypertrophy) 11/20/2015   Arrhythmia 10/11/2015   Hypertension, essential 02/24/2015   Hyperlipidemia 02/24/2015   Personal history of colonic polyps-adenomas 01/07/2012    Past Surgical History:  Procedure Laterality Date   BREAST BIOPSY Right    No Scar seen    COLONOSCOPY  multiple   ESOPHAGOGASTRODUODENOSCOPY (EGD) WITH PROPOFOL N/A 11/23/2019   Procedure: ESOPHAGOGASTRODUODENOSCOPY (EGD) WITH PROPOFOL;  Surgeon: Daneil Dolin, MD;  Location: [facility];  Service: Endoscopy;  Laterality: N/A;   ESOPHAGOGASTRODUODENOSCOPY (EGD) WITH PROPOFOL N/A 10/20/2020   Procedure: ESOPHAGOGASTRODUODENOSCOPY (EGD) WITH PROPOFOL;  Surgeon: Eloise Harman, DO;  Location: [facility];  Service: Endoscopy;  Laterality: N/A;   ESOPHAGOGASTRODUODENOSCOPY (EGD) WITH PROPOFOL N/A 12/05/2020   Procedure: ESOPHAGOGASTRODUODENOSCOPY (EGD) WITH PROPOFOL;  Surgeon: Rogene Houston, MD;  Location: [facility];  Service: Endoscopy;  Laterality: N/A;  with enteroscopy   GIVENS CAPSULE STUDY N/A 11/23/2019   Procedure: GIVENS CAPSULE STUDY;  Surgeon: Daneil Dolin, MD;   Location: [facility];  Service: Endoscopy;  Laterality: N/A;   GIVENS CAPSULE STUDY N/A 10/22/2020   Procedure: GIVENS CAPSULE STUDY;  Surgeon: Eloise Harman, DO;  Location: [facility];  Service: Endoscopy;  Laterality: N/A;   IR ANGIOGRAM SELECTIVE EACH ADDITIONAL VESSEL  12/05/2020   IR ANGIOGRAM SELECTIVE EACH ADDITIONAL VESSEL  12/05/2020   IR ANGIOGRAM SELECTIVE EACH ADDITIONAL VESSEL  12/05/2020   IR ANGIOGRAM VISCERAL SELECTIVE  12/05/2020   IR EMBO ART  VEN HEMORR LYMPH EXTRAV  INC GUIDE ROADMAPPING  12/05/2020   IR US GUIDE VASC ACCESS RIGHT  12/05/2020     OB History   No obstetric history on file.     Family History  Problem Relation Age of Onset   Colon cancer Mother 48       80's   Breast cancer Sister    Asthma Brother    Colon polyps Neg Hx    Esophageal cancer Neg Hx    Rectal cancer Neg Hx    Stomach cancer Neg Hx     Social History   Tobacco Use   Smoking status: Former    Types: Cigarettes    Quit date: 12/23/1984    Years since quitting: 35.9   Smokeless tobacco: Never  Vaping Use   Vaping Use: Never used  Substance Use Topics   Alcohol use: Yes    Comment: rare   Drug use: No    Home Medications Prior to Admission medications   Medication Sig Start Date End Date Taking? Authorizing Provider  Ascorbic Acid (VITAMIN C) 1000 MG tablet Take 1,000 mg by mouth daily.   Yes [provider]  bisoprolol (ZEBETA) 5 MG tablet Take 0.5 tablets (2.5 mg total) by mouth daily. 10/27/20  Yes Lendon Colonel, NP  clobetasol ointment (TEMOVATE) 0.98 % Apply 1 application topically 2 (two) times daily as needed (skin). 10/26/20  Yes [provider]  Copper Gluconate (COPPER CAPS PO) Take 1 capsule by mouth daily.    Yes [provider]  cyanocobalamin ([employer] VITAMIN B12) 2000 MCG tablet Take 1 tablet (2,000 mcg total) by mouth daily. 03/25/19  Yes Johnson, Clanford L, MD  Magnesium 200 MG TABS Take 1 tablet (200 mg total) by mouth  daily. 03/23/19  Yes Lendon Colonel, NP  Multiple Minerals-Vitamins (CALCIUM-MAGNESIUM-ZINC-D3 PO) Take 1 tablet by mouth daily.   Yes [provider]  Multiple Vitamin (MULTIVITAMIN) capsule Take 1 capsule by mouth daily.    Yes [provider]  pantoprazole (PROTONIX) 40 MG tablet 1 po BID x 8 weeks, then 1 po daily Patient taking differently: Take 40 mg by mouth See admin instructions. Starting 9.15.22, take 1 tablet by mouth twice daily for 8 weeks, then 1 tablet by mouth once daily. 12/07/20  Yes Johnson, Clanford L, MD  rosuvastatin (CRESTOR) 10 MG tablet TAKE 1 TABLET BY MOUTH AT  BEDTIME Patient taking differently: Take 10 mg by mouth daily. 04/12/20  Yes Ronnie Doss M, DO  vitamin E 400 UNIT capsule Take 400 Units by mouth daily.    Yes [provider]    Allergies    Sulfa antibiotics  Review of Systems   Review of Systems  Constitutional:  Positive for fatigue. Negative for chills and fever.  HENT:  Negative for congestion.   Respiratory:  Positive for shortness of breath.   Cardiovascular:  Positive for chest pain.  Gastrointestinal:  Positive for constipation. Negative for abdominal pain, nausea and vomiting.  Genitourinary:  Negative for enuresis.  Musculoskeletal:  Negative for back pain.  Skin:  Negative for rash.  Neurological:  Positive for dizziness. Negative for headaches.  Hematological:  Does not bruise/bleed easily.   Physical Exam Updated Vital Signs BP 98/60   Pulse 81   Temp 98.1 F (36.7 C) (Oral)   Resp 16   SpO2 99%   Physical Exam Vitals and nursing note reviewed. Exam conducted with a chaperone present.  Constitutional:      General: She is not in acute distress.    Appearance: She is not ill-appearing.  HENT:     Head: Normocephalic and atraumatic.     Nose: No congestion.  Eyes:     Conjunctiva/sclera: Conjunctivae normal.  Cardiovascular:     Rate and Rhythm: Normal rate and regular rhythm.      Pulses: Normal pulses.     Heart sounds: No murmur heard.   No friction rub. No gallop.  Pulmonary:     Effort: No respiratory distress.     Breath sounds: No wheezing, rhonchi or rales.  Abdominal:     Palpations: Abdomen is soft.     Tenderness: There is no abdominal tenderness. There is no right CVA tenderness or left CVA tenderness.  Genitourinary:    Rectum: Guaiac result negative.     Comments: With chaperone present rectal exam was performed she has noted external hemorrhoids, no palpable internal hemorrhoids present, she had melena, Hemoccult positive Musculoskeletal:     Right lower leg: No edema.     Left lower leg: No edema.  Skin:    General: Skin is warm and dry.  Neurological:     Mental Status: She is alert.  Psychiatric:        Mood and Affect: Mood normal.    ED Results / Procedures / Treatments   Labs (all labs ordered are listed, but only abnormal results are displayed) Labs Reviewed  COMPREHENSIVE METABOLIC PANEL - Abnormal; Notable for the following components:      Result Value   Glucose, Bld 119 (*)    Calcium 7.9 (*)    Total Protein 5.6 (*)    Albumin 3.1 (*)    Alkaline Phosphatase 22 (*)    All other components within normal limits  CBC WITH DIFFERENTIAL/PLATELET - Abnormal; Notable for the following components:   RBC 1.81 (*)    Hemoglobin 5.6 (*)    HCT 18.0 (*)    All other components within normal limits  POC OCCULT BLOOD, ED - Abnormal; Notable for the following components:   Fecal Occult Bld POSITIVE (*)    All other components within normal limits  PROTIME-INR  TYPE AND SCREEN  PREPARE RBC (CROSSMATCH)    EKG EKG Interpretation  Date/Time:  Sunday December 10 2020 11:21:40 EDT Ventricular Rate:  94 PR Interval:  199 QRS Duration: 97 QT Interval:  378 QTC Calculation: 473 R Axis:   22 Text Interpretation: Incomplete analysis due to missing data in precordial lead(s) Sinus rhythm LVH with secondary repolarization abnormality  Anterior Q waves, possibly due to LVH Missing lead(s): V4 lead missing otherwise no significant change Confirmed by Dorie Rank [phone]) on  12/10/2020 11:27:29 AM  Radiology DG Chest Port 1 View  Result Date: 12/10/2020 CLINICAL DATA:  Shortness of breath and weakness over the last 4 days EXAM: PORTABLE CHEST 1 VIEW COMPARISON:  12/04/2020 FINDINGS: Stable mild to moderate enlargement of the cardiopericardial silhouette, without edema. Atherosclerotic calcification of the aortic arch. Thoracic spondylosis. No blunting of the costophrenic angles. Mild degenerative glenohumeral arthropathy bilaterally. IMPRESSION: 1. Stable mild to moderate enlargement of the cardiopericardial silhouette, without edema. 2.  Aortic Atherosclerosis (ICD10-I70.0). 3. Thoracic spondylosis. Electronically Signed   By: Van Clines M.D.   On: 12/10/2020 12:06   CT Angio Abd/Pel W and/or Wo Contrast  Result Date: 12/10/2020 CLINICAL DATA:  GI bleed, weakness EXAM: CTA ABDOMEN AND PELVIS WITHOUT AND WITH CONTRAST TECHNIQUE: Multidetector CT imaging of the abdomen and pelvis was performed using the standard protocol during bolus administration of intravenous contrast. Multiplanar reconstructed images and MIPs were obtained and reviewed to evaluate the vascular anatomy. CONTRAST:  111m OMNIPAQUE IOHEXOL 350 MG/ML SOLN COMPARISON:  CT abdomen pelvis, 11/14/2019, interventional angiogram, 12/05/2020 FINDINGS: VASCULAR Normal contour and caliber of the abdominal aorta. No evidence of aortic aneurysm, dissection, or other acute aortic pathology. Moderate mixed calcific atherosclerosis. Standard branching pattern of the abdominal aorta, with solitary bilateral renal arteries. The branch vessel origins are patent. Coil material within the pancreaticoduodenal and gastroduodenal arteries. 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 Review of the MIP images confirms the above findings. NON-VASCULAR Lower chest: No acute abnormality.  Coronary artery calcifications. Hepatobiliary: No solid liver abnormality is seen. No gallstones, gallbladder wall thickening, or biliary dilatation. Pancreas: Unremarkable. No pancreatic ductal dilatation or surrounding inflammatory changes. Spleen: Normal in size without significant abnormality. Adrenals/Urinary Tract: Adrenal glands are unremarkable. Kidneys are normal, without renal calculi, solid lesion, or hydronephrosis. Bladder is unremarkable. Stomach/Bowel: Stomach is within normal limits. Appendix appears normal. No evidence of bowel wall thickening, distention, or inflammatory changes. Lymphatic: No enlarged abdominal or pelvic lymph nodes. Reproductive: No mass or other significant abnormality. Other: No abdominal wall hernia or abnormality. No abdominopelvic ascites. Musculoskeletal: No acute or significant osseous findings. IMPRESSION: 1. Normal contour and caliber of the abdominal aorta. No evidence of aortic aneurysm, dissection, or other acute aortic pathology. Moderate mixed calcific atherosclerosis. 2. 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 3. No contrast extravasation within the bowel lumen. 4. Coil material within the pancreaticoduodenal and gastroduodenal arteries, per recent coiling procedure. 5. Coronary artery disease. Aortic Atherosclerosis (ICD10-I70.0). Electronically Signed   By: AEddie CandleM.D.   On: 12/10/2020 13:25    Procedures .Critical Care Performed by: FMarcello Fennel PA-C Authorized by: FMarcello Fennel PA-C   Critical care provider statement:    Critical care time (minutes):  45   Critical care was necessary to treat or prevent imminent or life-threatening deterioration of the following conditions:  Circulatory failure   Critical care was  time spent personally by me on the following activities:  Discussions with consultants, evaluation of patient's response to treatment, examination of patient, ordering and performing treatments and interventions, ordering and review of laboratory studies, ordering and review of radiographic studies, pulse oximetry, re-evaluation of patient's condition and review of old charts   I assumed direction of critical care for this patient from another provider in my specialty:  no     Care discussed with: admitting provider     Medications Ordered in ED Medications  0.9 %  sodium chloride infusion (has no administration in time range)  sodium chloride 0.9 % bolus 1,000 mL (1,000 mLs Intravenous New Bag/Given 12/10/20 1358)  iohexol (OMNIPAQUE) 350 MG/ML injection 100 mL (100 mLs Intravenous Contrast Given 12/10/20 1228)  acetaminophen (TYLENOL) tablet 1,000 mg (1,000 mg Oral Given 12/10/20 1505)    ED Course  I have reviewed the triage vital signs and the nursing notes.  Pertinent labs & imaging results that were available during my care of the patient were reviewed by me and considered in my medical decision making (see chart for details).    MDM Rules/Calculators/A&P                          Initial impression-patient presents with shortness of breath and fatigue.  She is alert, does not appear in acute stress, vital signs reassuring.  Concern for possible acute GI bleed, will obtain basic lab work-up, continue to monitor.  Work-up-CBC shows normocytic anemia hemoglobin of 5.6, CMP shows hyperglycemia of 119, calcium 7.9, total protein 5.6, albumin 6.1, alk phos 22, Hemoccult positive, chest x-ray unremarkable, CTA abdomen pelvis no evidence of aortic aneurysm dissection or acute aortic pathology, pseudoaneurysm arising from the common femoral artery, no acute abnormalities present.  Reassessment-notified that patient had a hemoglobin of 5.6, the positive Hemoccult concern for upper GI bleed, will  obtain type and screen, recommend blood transfusion.  Patient was agreement this plan, will consult with GI for further recommendations.  Protonix were withheld as she already had 40 mg of her home dose.  Patient is reassessed with fluids and blood transfusion blood pressure remained stable, she has no complaints this time.  Updated patient on plan from GI she is agreement with this will consult hospitalist for admission.   Consult-spoke with Dr. Abbey Chatters of GI he recommends providing the patient with fluids, obtain CT angio for further evaluation.  Updated Dr. Abbey Chatters on imaging results he recommends continue with blood transfusion, patient may have a clear liquids n.p.o. at midnight tonight.  Admit to hospitalist team.  Spoke with Dr. Su Ley who will admit the patient.  Rule out-low suspicion for bowel obstruction as abdomen is nondistended dull to percussion, patient still passing gas.  Low suspicion for ruptured stomach ulcer, complicated diverticulitis as she has no peritoneal sign present my exam, no leukocytosis noted on CBC.  I have low suspicion for ACS as EKG is sinus without signs of ischemia, presentation is atypical, chest pain only happens when she comes short of breath likely secondary due to symptomatic anemia.  Low suspicion for volume overloaded as she does not appear to be volume overload on my exam, no rales heard on exam, chest x-ray negative for pulmonary edema.  Suspicion for PE DVT as she is nontachycardic, nontachypneic, not hypoxic, no unilateral leg swelling, presentation atypical of etiology.  Plan-admit to medicine due to somatic anemia secondary due to upper GI bleed. Final Clinical Impression(s) / ED Diagnoses Final diagnoses:  Normocytic anemia  Upper GI bleed    Rx / DC Orders ED Discharge Orders     None        Marcello Fennel, PA-C 12/10/20 1522    Dorie Rank, MD 12/11/20 [phone]

## 2020-12-10 NOTE — ED Triage Notes (Signed)
Pt states she had a black stool yesterday, no BM today.

## 2020-12-10 NOTE — ED Triage Notes (Signed)
Pt just discharged on Thursday,  pt SOB continued and generalized weakness since waking up this morning.  Pt states bilateral arms and legs "feel like jello" and that left side worse than right.  States she had low BP at home-SBP of 85

## 2020-12-10 NOTE — ED Notes (Signed)
ED TO INPATIENT HANDOFF REPORT  ED Nurse Name and Phone #:   S Name/Age/Gender Stacey Reyes 75 y.o. female Room/Bed: APA18/APA18  Code Status   Code Status: Prior  Home/SNF/Other Home Patient oriented to: self, place, time, and situation Is this baseline? Yes   Triage Complete: Triage complete  Chief Complaint GI bleed [K92.2]  Triage Note Pt just discharged on Thursday,  pt SOB continued and generalized weakness since waking up this morning.  Pt states bilateral arms and legs "feel like jello" and that left side worse than right.  States she had low BP at home-SBP of 85  Pt states she had a black stool yesterday, no BM today.     Allergies Allergies  Allergen Reactions   Sulfa Antibiotics Rash    Level of Care/Admitting Diagnosis ED Disposition     ED Disposition  Admit   Condition  --   [place]: [facility] [id number]  Level of Care: Telemetry [5]  Covid Evaluation: Asymptomatic Screening Protocol (No Symptoms)  Diagnosis: GI bleed [id number]  Admitting Physician: Bethena Roys [id number]  Attending Physician: Kara Pacer          B Medical/Surgery History Past Medical History:  Diagnosis Date   Anemia    Blood transfusion without reported diagnosis    Cataract    removed bilateral   Chronic cystitis    Diverticulitis    Heart murmur    Hyperlipidemia    Hypertension    Personal history of colonic polyps-adenomas 01/07/2012   2009 - 2 diminutive adenomas (prior polyps also) 01/07/2012 - 2 diminutive adenomas     Past Surgical History:  Procedure Laterality Date   BREAST BIOPSY Right    No Scar seen    COLONOSCOPY  multiple   ESOPHAGOGASTRODUODENOSCOPY (EGD) WITH PROPOFOL N/A 11/23/2019   Procedure: ESOPHAGOGASTRODUODENOSCOPY (EGD) WITH PROPOFOL;  Surgeon: Daneil Dolin, MD;  [facility];  Service: Endoscopy;  Laterality: N/A;   ESOPHAGOGASTRODUODENOSCOPY (EGD) WITH PROPOFOL N/A  10/20/2020   Procedure: ESOPHAGOGASTRODUODENOSCOPY (EGD) WITH PROPOFOL;  Surgeon: Eloise Harman, DO;  [facility];  Service: Endoscopy;  Laterality: N/A;   ESOPHAGOGASTRODUODENOSCOPY (EGD) WITH PROPOFOL N/A 12/05/2020   Procedure: ESOPHAGOGASTRODUODENOSCOPY (EGD) WITH PROPOFOL;  Surgeon: Rogene Houston, MD;  [facility];  Service: Endoscopy;  Laterality: N/A;  with enteroscopy   GIVENS CAPSULE STUDY N/A 11/23/2019   Procedure: GIVENS CAPSULE STUDY;  Surgeon: Daneil Dolin, MD;  [facility];  Service: Endoscopy;  Laterality: N/A;   GIVENS CAPSULE STUDY N/A 10/22/2020   Procedure: GIVENS CAPSULE STUDY;  Surgeon: Eloise Harman, DO;  [facility];  Service: Endoscopy;  Laterality: N/A;   IR ANGIOGRAM SELECTIVE EACH ADDITIONAL VESSEL  12/05/2020   IR ANGIOGRAM SELECTIVE EACH ADDITIONAL VESSEL  12/05/2020   IR ANGIOGRAM SELECTIVE EACH ADDITIONAL VESSEL  12/05/2020   IR ANGIOGRAM VISCERAL SELECTIVE  12/05/2020   IR EMBO ART  VEN HEMORR LYMPH EXTRAV  INC GUIDE ROADMAPPING  12/05/2020   IR US GUIDE VASC ACCESS RIGHT  12/05/2020     A IV Location/Drains/Wounds Patient Lines/Drains/Airways Status     Active Line/Drains/Airways     Name Placement date Placement time Site Days   Peripheral IV 12/10/20 20 G Left Antecubital 12/10/20  1131  Antecubital  less than 1   Peripheral IV 12/10/20 22 G 1" Posterior;Right Hand 12/10/20  1351  Hand  less than 1   Wound /  Incision (Open or Dehisced) 12/05/20 Puncture Groin Anterior;Proximal;Right arterial access puncture site 12/05/20  1759  Groin  5            Intake/Output Last 24 hours  Intake/Output Summary (Last 24 hours) at 12/10/2020 1533 Last data filed at 12/10/2020 1331 Gross per 24 hour  Intake 0 ml  Output --  Net 0 ml    Labs/Imaging Results for orders placed or performed during the hospital encounter of 12/10/20 (from the past 48 hour(s))  Comprehensive metabolic panel     Status:  Abnormal   Collection Time: 12/10/20 11:20 AM  Result Value Ref Range   Sodium 136 135 - 145 mmol/L   Potassium 3.9 3.5 - 5.1 mmol/L   Chloride 106 98 - 111 mmol/L   CO2 25 22 - 32 mmol/L   Glucose, Bld 119 (H) 70 - 99 mg/dL    Comment: Glucose reference range applies only to samples taken after fasting for at least 8 hours.   BUN 20 8 - 23 mg/dL   Creatinine, Ser 0.61 0.44 - 1.00 mg/dL   Calcium 7.9 (L) 8.9 - 10.3 mg/dL   Total Protein 5.6 (L) 6.5 - 8.1 g/dL   Albumin 3.1 (L) 3.5 - 5.0 g/dL   AST 20 15 - 41 U/L   ALT 14 0 - 44 U/L   Alkaline Phosphatase 22 (L) 38 - 126 U/L   Total Bilirubin 0.6 0.3 - 1.2 mg/dL   GFR, Estimated >60 >60 mL/min    Comment: (NOTE) Calculated using the CKD-EPI Creatinine Equation (2021)    Anion gap 5 5 - 15    Comment: Performed at [facility], [address], [place], [place] [address]  CBC with Differential     Status: Abnormal   Collection Time: 12/10/20 11:20 AM  Result Value Ref Range   WBC 8.7 4.0 - 10.5 K/uL   RBC 1.81 (L) 3.87 - 5.11 MIL/uL   Hemoglobin 5.6 (LL) 12.0 - 15.0 g/dL    Comment: REPEATED TO VERIFY THIS CRITICAL RESULT HAS VERIFIED AND BEEN CALLED TO [place] [facility] BY LATISHA HENDERSON ON 09 18 2022 AT 1145, AND HAS BEEN READ BACK.     HCT 18.0 (L) 36.0 - 46.0 %   MCV 99.4 80.0 - 100.0 fL   MCH 30.9 26.0 - 34.0 pg   MCHC 31.1 30.0 - 36.0 g/dL   RDW 15.5 11.5 - 15.5 %   Platelets 238 150 - 400 K/uL   nRBC 0.0 0.0 - 0.2 %   Neutrophils Relative % 67 %   Neutro Abs 6.0 1.7 - 7.7 K/uL   Lymphocytes Relative 22 %   Lymphs Abs 1.9 0.7 - 4.0 K/uL   Monocytes Relative 7 %   Monocytes Absolute 0.6 0.1 - 1.0 K/uL   Eosinophils Relative 2 %   Eosinophils Absolute 0.1 0.0 - 0.5 K/uL   Basophils Relative 1 %   Basophils Absolute 0.1 0.0 - 0.1 K/uL   Immature Granulocytes 1 %   Abs Immature Granulocytes 0.04 0.00 - 0.07 K/uL    Comment: Performed at [facility], [address], [place], [place] [address]  Protime-INR     Status: None    Collection Time: 12/10/20 11:20 AM  Result Value Ref Range   Prothrombin Time 14.2 11.4 - 15.2 seconds   INR 1.1 0.8 - 1.2    Comment: (NOTE) INR goal varies based on device and disease states. Performed at [facility], [address] [place], [place] [address]  Type and screen [facility]     Status: None (Preliminary result)   Collection Time: 12/10/20 11:44 AM  Result Value Ref Range   ABO/RH(D) O POS    Antibody Screen NEG    Sample Expiration 12/13/2020,2359    Unit Number [id number]    Blood Component Type RED CELLS,LR    Unit division 00    Status of Unit ISSUED    Transfusion Status OK TO TRANSFUSE    Crossmatch Result      Compatible Performed at [facility], [address] [place], [place] [address]    Unit Number [id number]    Blood Component Type RED CELLS,LR    Unit division 00    Status of Unit ALLOCATED    Transfusion Status OK TO TRANSFUSE    Crossmatch Result Compatible   Prepare RBC (crossmatch)     Status: None   Collection Time: 12/10/20 11:55 AM  Result Value Ref Range   Order Confirmation      ORDER PROCESSED BY BLOOD BANK Performed at [facility], [address] [place], [place] [address]   POC occult blood, ED     Status: Abnormal   Collection Time: 12/10/20 12:11 PM  Result Value Ref Range   Fecal Occult Bld POSITIVE (A) NEGATIVE   DG Chest Port 1 View  Result Date: 12/10/2020 CLINICAL DATA:  Shortness of breath and weakness over the last 4 days EXAM: PORTABLE CHEST 1 VIEW COMPARISON:  12/04/2020 FINDINGS: Stable mild to moderate enlargement of the cardiopericardial silhouette, without edema. Atherosclerotic calcification of the aortic arch. Thoracic spondylosis. No blunting of the costophrenic angles. Mild degenerative glenohumeral arthropathy bilaterally. IMPRESSION: 1. Stable mild to moderate enlargement of the cardiopericardial silhouette, without edema. 2.  Aortic Atherosclerosis (ICD10-I70.0). 3. Thoracic spondylosis.  Electronically Signed   By: Van Clines M.D.   On: 12/10/2020 12:06   CT Angio Abd/Pel W and/or Wo Contrast  Result Date: 12/10/2020 CLINICAL DATA:  GI bleed, weakness EXAM: CTA ABDOMEN AND PELVIS WITHOUT AND WITH CONTRAST TECHNIQUE: Multidetector CT imaging of the abdomen and pelvis was performed using the standard protocol during bolus administration of intravenous contrast. Multiplanar reconstructed images and MIPs were obtained and reviewed to evaluate the vascular anatomy. CONTRAST:  138m OMNIPAQUE IOHEXOL 350 MG/ML SOLN COMPARISON:  CT abdomen pelvis, 11/14/2019, interventional angiogram, 12/05/2020 FINDINGS: VASCULAR Normal contour and caliber of the abdominal aorta. No evidence of aortic aneurysm, dissection, or other acute aortic pathology. Moderate mixed calcific atherosclerosis. Standard branching pattern of the abdominal aorta, with solitary bilateral renal arteries. The branch vessel origins are patent. Coil material within the pancreaticoduodenal and gastroduodenal arteries. 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 Review of the MIP images confirms the above findings. NON-VASCULAR Lower chest: No acute abnormality.  Coronary artery calcifications. Hepatobiliary: No solid liver abnormality is seen. No gallstones, gallbladder wall thickening, or biliary dilatation. Pancreas: Unremarkable. No pancreatic ductal dilatation or surrounding inflammatory changes. Spleen: Normal in size without significant abnormality. Adrenals/Urinary Tract: Adrenal glands are unremarkable. Kidneys are normal, without renal calculi, solid lesion, or hydronephrosis. Bladder is unremarkable. Stomach/Bowel: Stomach is within normal limits. Appendix appears normal. No evidence of bowel wall thickening, distention, or inflammatory changes. Lymphatic: No enlarged abdominal or pelvic lymph nodes.  Reproductive: No mass or other significant abnormality. Other: No abdominal wall hernia or abnormality. No abdominopelvic ascites. Musculoskeletal: No acute or significant osseous findings.  IMPRESSION: 1. Normal contour and caliber of the abdominal aorta. No evidence of aortic aneurysm, dissection, or other acute aortic pathology. Moderate mixed calcific atherosclerosis. 2. 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 3. No contrast extravasation within the bowel lumen. 4. Coil material within the pancreaticoduodenal and gastroduodenal arteries, per recent coiling procedure. 5. Coronary artery disease. Aortic Atherosclerosis (ICD10-I70.0). Electronically Signed   By: Eddie Candle M.D.   On: 12/10/2020 13:25    Pending Labs Unresulted Labs (From admission, onward)    None       Vitals/Pain Today's Vitals   12/10/20 1442 12/10/20 1445 12/10/20 1500 12/10/20 1524  BP: (!) 1'18/56 99/63 98/60 '$ (!) 110/51  Pulse: 84 83 81   Resp: '18 16 16 15  '$ Temp: 98.1 F (36.7 C)     TempSrc: Oral     SpO2: 97% 98% 99%   PainSc:        Isolation Precautions No active isolations  Medications Medications  0.9 %  sodium chloride infusion (has no administration in time range)  sodium chloride 0.9 % bolus 1,000 mL (1,000 mLs Intravenous New Bag/Given 12/10/20 1358)  iohexol (OMNIPAQUE) 350 MG/ML injection 100 mL (100 mLs Intravenous Contrast Given 12/10/20 1228)  acetaminophen (TYLENOL) tablet 1,000 mg (1,000 mg Oral Given 12/10/20 1505)    Mobility walks Low fall risk   Focused Assessments   R Recommendations: See Admitting Provider Note  Report given to:   Additional Notes:

## 2020-12-10 NOTE — ED Notes (Signed)
Pt. To CT

## 2020-12-10 NOTE — Progress Notes (Signed)
Date and time results received: 12/10/20 2131  Test: Hemoglobin Critical Value: 6.4  Name of Provider Notified: Dr. Clearence Ped

## 2020-12-11 ENCOUNTER — Observation Stay (HOSPITAL_COMMUNITY): Payer: Medicare Other | Admitting: Anesthesiology

## 2020-12-11 ENCOUNTER — Encounter (HOSPITAL_COMMUNITY): Payer: Self-pay | Admitting: Internal Medicine

## 2020-12-11 ENCOUNTER — Encounter (HOSPITAL_COMMUNITY)
Admission: EM | Disposition: A | Discharge status: Home / Self Care | Payer: Self-pay | Source: Home / Self Care | Attending: Pulmonary Disease

## 2020-12-11 DIAGNOSIS — K922 Gastrointestinal hemorrhage, unspecified: Secondary | ICD-10-CM | POA: Diagnosis not present

## 2020-12-11 DIAGNOSIS — D62 Acute posthemorrhagic anemia: Secondary | ICD-10-CM | POA: Diagnosis not present

## 2020-12-11 DIAGNOSIS — I340 Nonrheumatic mitral (valve) insufficiency: Secondary | ICD-10-CM | POA: Diagnosis not present

## 2020-12-11 DIAGNOSIS — I422 Other hypertrophic cardiomyopathy: Secondary | ICD-10-CM

## 2020-12-11 DIAGNOSIS — I10 Essential (primary) hypertension: Secondary | ICD-10-CM | POA: Diagnosis not present

## 2020-12-11 DIAGNOSIS — K31811 Angiodysplasia of stomach and duodenum with bleeding: Secondary | ICD-10-CM | POA: Diagnosis not present

## 2020-12-11 DIAGNOSIS — K921 Melena: Secondary | ICD-10-CM | POA: Diagnosis not present

## 2020-12-11 HISTORY — PX: HEMOSTASIS CLIP PLACEMENT: SHX6857

## 2020-12-11 HISTORY — PX: ESOPHAGOGASTRODUODENOSCOPY (EGD) WITH PROPOFOL: SHX5813

## 2020-12-11 LAB — POCT I-STAT, CHEM 8
BUN: 16 mg/dL (ref 8–23)
Calcium, Ion: 0.98 mmol/L — ABNORMAL LOW (ref 1.15–1.40)
Chloride: 107 mmol/L (ref 98–111)
Creatinine, Ser: 0.5 mg/dL (ref 0.44–1.00)
Glucose, Bld: 100 mg/dL — ABNORMAL HIGH (ref 70–99)
HCT: 21 % — ABNORMAL LOW (ref 36.0–46.0)
Hemoglobin: 7.1 g/dL — ABNORMAL LOW (ref 12.0–15.0)
Potassium: 4.1 mmol/L (ref 3.5–5.1)
Sodium: 138 mmol/L (ref 135–145)
TCO2: 25 mmol/L (ref 22–32)

## 2020-12-11 LAB — PREPARE RBC (CROSSMATCH)

## 2020-12-11 LAB — HEMOGLOBIN AND HEMATOCRIT, BLOOD
HCT: 19.7 % — ABNORMAL LOW (ref 36.0–46.0)
Hemoglobin: 6.4 g/dL — CL (ref 12.0–15.0)

## 2020-12-11 LAB — CBC
HCT: 19.5 % — ABNORMAL LOW (ref 36.0–46.0)
HCT: 27.1 % — ABNORMAL LOW (ref 36.0–46.0)
HCT: 25.8 % — ABNORMAL LOW (ref 36.0–46.0)
Hemoglobin: 6.2 g/dL — CL (ref 12.0–15.0)
Hemoglobin: 8.7 g/dL — ABNORMAL LOW (ref 12.0–15.0)
Hemoglobin: 8.5 g/dL — ABNORMAL LOW (ref 12.0–15.0)
MCH: 30.5 pg (ref 26.0–34.0)
MCH: 30.3 pg (ref 26.0–34.0)
MCH: 30.8 pg (ref 26.0–34.0)
MCHC: 31.8 g/dL (ref 30.0–36.0)
MCHC: 32.1 g/dL (ref 30.0–36.0)
MCHC: 32.9 g/dL (ref 30.0–36.0)
MCV: 96.1 fL (ref 80.0–100.0)
MCV: 94.4 fL (ref 80.0–100.0)
MCV: 93.5 fL (ref 80.0–100.0)
Platelets: 184 10*3/uL (ref 150–400)
Platelets: 187 10*3/uL (ref 150–400)
Platelets: 184 10*3/uL (ref 150–400)
RBC: 2.03 MIL/uL — ABNORMAL LOW (ref 3.87–5.11)
RBC: 2.87 MIL/uL — ABNORMAL LOW (ref 3.87–5.11)
RBC: 2.76 MIL/uL — ABNORMAL LOW (ref 3.87–5.11)
RDW: 15.9 % — ABNORMAL HIGH (ref 11.5–15.5)
RDW: 15.7 % — ABNORMAL HIGH (ref 11.5–15.5)
RDW: 15.9 % — ABNORMAL HIGH (ref 11.5–15.5)
WBC: 5.9 10*3/uL (ref 4.0–10.5)
WBC: 8.7 10*3/uL (ref 4.0–10.5)
WBC: 8.1 10*3/uL (ref 4.0–10.5)
nRBC: 0 % (ref 0.0–0.2)
nRBC: 0.2 % (ref 0.0–0.2)
nRBC: 0 % (ref 0.0–0.2)

## 2020-12-11 LAB — BASIC METABOLIC PANEL
Anion gap: 2 — ABNORMAL LOW (ref 5–15)
BUN: 17 mg/dL (ref 8–23)
CO2: 27 mmol/L (ref 22–32)
Calcium: 7.8 mg/dL — ABNORMAL LOW (ref 8.9–10.3)
Chloride: 108 mmol/L (ref 98–111)
Creatinine, Ser: 0.62 mg/dL (ref 0.44–1.00)
GFR, Estimated: >60 mL/min (ref >60)
Glucose, Bld: 103 mg/dL — ABNORMAL HIGH (ref 70–99)
Potassium: 3.9 mmol/L (ref 3.5–5.1)
Sodium: 137 mmol/L (ref 135–145)

## 2020-12-11 SURGERY — ESOPHAGOGASTRODUODENOSCOPY (EGD) WITH PROPOFOL
Anesthesia: General

## 2020-12-11 MED ORDER — ROCURONIUM BROMIDE 100 MG/10ML IV SOLN
INTRAVENOUS | Status: DC | PRN
  Administered 2020-12-11: 20 mg via INTRAVENOUS

## 2020-12-11 MED ORDER — SUGAMMADEX SODIUM 200 MG/2ML IV SOLN
INTRAVENOUS | Status: DC | PRN
  Administered 2020-12-11: 200 mg via INTRAVENOUS

## 2020-12-11 MED ORDER — STERILE WATER FOR IRRIGATION IR SOLN
Status: DC | PRN
  Administered 2020-12-11: 100 mL

## 2020-12-11 MED ORDER — SODIUM CHLORIDE 0.9% IV SOLUTION: Freq: Once | INTRAVENOUS | Status: DC

## 2020-12-11 MED ORDER — SODIUM CHLORIDE (PF) 0.9 % IJ SOLN
PREFILLED_SYRINGE | INTRAMUSCULAR | Status: DC | PRN
  Administered 2020-12-11: 2.5 mL

## 2020-12-11 MED ORDER — SODIUM CHLORIDE 0.9 % IV SOLN: INTRAVENOUS | Status: DC

## 2020-12-11 MED ORDER — PROPOFOL 10 MG/ML IV BOLUS
INTRAVENOUS | Status: DC | PRN
  Administered 2020-12-11: 125 ug/kg/min via INTRAVENOUS
  Administered 2020-12-11: 100 mg via INTRAVENOUS
  Administered 2020-12-11: 50 mg via INTRAVENOUS
  Administered 2020-12-11: 100 mg via INTRAVENOUS

## 2020-12-11 MED ORDER — MENTHOL 3 MG MT LOZG
1.0000 | LOZENGE | OROMUCOSAL | Status: DC | PRN
  Administered 2020-12-11: 3 mg via ORAL
  Filled 2020-12-11 (×2): qty 9

## 2020-12-11 MED ORDER — LIDOCAINE HCL (CARDIAC) PF 100 MG/5ML IV SOSY
PREFILLED_SYRINGE | INTRAVENOUS | Status: DC | PRN
  Administered 2020-12-11: 50 mg via INTRAVENOUS

## 2020-12-11 MED ORDER — SUCCINYLCHOLINE CHLORIDE 200 MG/10ML IV SOSY
PREFILLED_SYRINGE | INTRAVENOUS | Status: DC | PRN
  Administered 2020-12-11: 160 mg via INTRAVENOUS

## 2020-12-11 NOTE — Transfer of Care (Signed)
Immediate Anesthesia Transfer of Care Note  Patient: Stacey Reyes  Procedure(s) Performed: ESOPHAGOGASTRODUODENOSCOPY (EGD) WITH PROPOFOL HEMOSTASIS CLIP PLACEMENT  Patient Location: PACU  Anesthesia Type:General  Level of Consciousness: awake, alert , oriented and patient cooperative  Airway & Oxygen Therapy: Patient Spontanous Breathing and Patient connected to nasal cannula oxygen  Post-op Assessment: Report given to RN, Post -op Vital signs reviewed and stable and Patient moving all extremities X 4  Post vital signs: Reviewed and stable  Last Vitals:  Vitals Value Taken Time  BP    Temp    Pulse    Resp    SpO2      Last Pain:  Vitals:   12/11/20 1218  TempSrc:   PainSc: 0-No pain         Complications: No notable events documented.

## 2020-12-11 NOTE — Consult Note (Signed)
_0 @   Referring Provider: [facility] hospital[facility] Primary Care Physician:  [name], DO Primary Gastroenterologist: [facility]   Date of Admission: 12/10/2020 Date of Consultation: 12/11/2020  Reason for Consultation: Acute on chronic symptomatic anemia  HPI:  Stacey Reyes is a 75 y.o. year old female with IDA, extensive GI evaluation since August 2020 as listed below with AVMs and recently with suspected duodenal dieulafoy lesion s/p IR embolization, requiring blood transfusions, followed by Hematology with IV iron presenting again this admission with generalized weakness, shortness of breath, reported low blood pressure, and melena.   Most recently admitted 12/04/2020 - 12/07/2020 with acute on chronic symptomatic anemia, associated melena, heme positive stool, and needing transfusion.  She underwent EGD which found multiple gastric polyps, blood in the second portion of the duodenum, unable to localize bleeding site, but suspected Dieulafoy lesion.  She was transferred to [facility] and underwent post mesenteric arteriogram and empiric percutaneous coil embolization of the GDA and a hypertrophied pancreaticoduodenal artery by IR.    ED Course this admission:  Soft BP- 103/47. No tachycardia, O2 98% on RA, afebrile.  CBC with normocytic anemia, hemoglobin of 5.6 (down from 7.6 on 9/15).  Hemoccult positive. CMP with creatinine 0.61, BUN 20, potassium 3.9, sodium 136, glucose 119, calcium 7.9, AST 20, ALT 14, alk phos 22, total bilirubin 0.6. INR 1.1. Chest x-ray unremarkable. 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   She was transfused 2 units PRBCs and started on IV PPI twice daily. CBC this morning with hemoglobin 6.2.    Today:  No energy. Felt very short of breath, weak with little exertion. Worsening since 9/15. Continues with tarry black  stools daily since 9/15, but no BM yesterday or today. No brbpr. Lack of appetite and increased gas. No significant abdominal pain. Denies nausea or vomiting. Gets back pain when she is short of breath. Gets some chest pain and pain in her arms when she is struggling to breathe. Feels like jello. No CP or SOB laying in bed today. Has a mild HA this morning. Taking PPI BID at home.   No NSAIDs. Only Tylenol for HA as needed.    Prior GI evaluation: EGD August 2020: Duodenal erosions biopsied (benign), otherwise normal exam.  Colonoscopy August 2020: 2 polyps removed (tubular adenomas), diverticulosis in the sigmoid, descending, transverse colon, otherwise normal exam. EGD August 2021: Normal esophagus, small hiatal hernia, innocent appearing duodenal erosions. Small bowel capsule August 2021: Benign duodenal erosions without active bleeding, large amounts of old blood in cecum with no obvious source. Colonoscopy November 2021: 7 polyps removed (tubular adenomas), diverticulosis in sigmoid, descending, transverse, and descending colon, erythema and petechia in association with diverticulosis in the left colon biopsies (benign), external and internal hemorrhoids. Givens capsule November 2021: Duodenitis, 1 small AVM in proximal small bowel about 8 minutes from first duodenal image, 1 tiny AVM at 1 hour 19 minutes, numerous tiny lesions versus luminal contents of unclear significance and distal small bowel. EGD July 2022: normal duodenal bulb, first portion of duodenum and second portion of duodenum.  Capsule study July 2022: Duodenitis. No obvious AVMs appreciated. Complete to the cecum. Possible old blood in colon but difficult to visualize due to debris.  EGD 12/05/20:  Multiple gastric polyps.  Blood in the second portion of the duodenum: Dieulafoy lesion suspected.  Use of duodenoscope did not help in localizing bleeding  site.  She underwent post mesenteric arteriogram and empiric percutaneous coil  embolization of the GDA and a hypertrophied pancreaticoduodenal artery by IR in [place].   Past Medical History:  Diagnosis Date   Anemia    Blood transfusion without reported diagnosis    Cataract    removed bilateral   Chronic cystitis    Diverticulitis    Heart murmur    Hyperlipidemia    Hypertension    Personal history of colonic polyps-adenomas 01/07/2012   2009 - 2 diminutive adenomas (prior polyps also) 01/07/2012 - 2 diminutive adenomas      Past Surgical History:  Procedure Laterality Date   BREAST BIOPSY Right    No Scar seen    COLONOSCOPY  multiple   ESOPHAGOGASTRODUODENOSCOPY (EGD) WITH PROPOFOL N/A 11/23/2019   Procedure: ESOPHAGOGASTRODUODENOSCOPY (EGD) WITH PROPOFOL;  Surgeon: Daneil Dolin, MD;  [facility];  Service: Endoscopy;  Laterality: N/A;   ESOPHAGOGASTRODUODENOSCOPY (EGD) WITH PROPOFOL N/A 10/20/2020   Procedure: ESOPHAGOGASTRODUODENOSCOPY (EGD) WITH PROPOFOL;  Surgeon: Eloise Harman, DO;  [facility]: [facility];  Service: Endoscopy;  Laterality: N/A;   ESOPHAGOGASTRODUODENOSCOPY (EGD) WITH PROPOFOL N/A 12/05/2020   Procedure: ESOPHAGOGASTRODUODENOSCOPY (EGD) WITH PROPOFOL;  Surgeon: Rogene Houston, MD;  [facility]: [facility];  Service: Endoscopy;  Laterality: N/A;  with enteroscopy   GIVENS CAPSULE STUDY N/A 11/23/2019   Procedure: GIVENS CAPSULE STUDY;  Surgeon: Daneil Dolin, MD;  [facility];  Service: Endoscopy;  Laterality: N/A;   GIVENS CAPSULE STUDY N/A 10/22/2020   Procedure: GIVENS CAPSULE STUDY;  Surgeon: Eloise Harman, DO;  [facility];  Service: Endoscopy;  Laterality: N/A;   IR ANGIOGRAM SELECTIVE EACH ADDITIONAL VESSEL  12/05/2020   IR ANGIOGRAM SELECTIVE EACH ADDITIONAL VESSEL  12/05/2020   IR ANGIOGRAM SELECTIVE EACH ADDITIONAL VESSEL  12/05/2020   IR ANGIOGRAM VISCERAL SELECTIVE  12/05/2020   IR EMBO ART  VEN HEMORR LYMPH EXTRAV  INC GUIDE ROADMAPPING  12/05/2020   IR US GUIDE VASC ACCESS  RIGHT  12/05/2020    Prior to Admission medications   Medication Sig Start Date End Date Taking? Authorizing Provider  Ascorbic Acid (VITAMIN C) 1000 MG tablet Take 1,000 mg by mouth daily.   Yes [provider]  bisoprolol (ZEBETA) 5 MG tablet Take 0.5 tablets (2.5 mg total) by mouth daily. 10/27/20  Yes Lendon Colonel, NP  clobetasol ointment (TEMOVATE) 5.73 % Apply 1 application topically 2 (two) times daily as needed (skin). 10/26/20  Yes [provider]  Copper Gluconate (COPPER CAPS PO) Take 1 capsule by mouth daily.    Yes [provider]  cyanocobalamin ([employer] VITAMIN B12) 2000 MCG tablet Take 1 tablet (2,000 mcg total) by mouth daily. 03/25/19  Yes Johnson, Clanford L, MD  Magnesium 200 MG TABS Take 1 tablet (200 mg total) by mouth daily. 03/23/19  Yes Lendon Colonel, NP  Multiple Minerals-Vitamins (CALCIUM-MAGNESIUM-ZINC-D3 PO) Take 1 tablet by mouth daily.   Yes [provider]  Multiple Vitamin (MULTIVITAMIN) capsule Take 1 capsule by mouth daily.    Yes [provider]  pantoprazole (PROTONIX) 40 MG tablet 1 po BID x 8 weeks, then 1 po daily Patient taking differently: Take 40 mg by mouth See admin instructions. Starting 9.15.22, take 1 tablet by mouth twice daily for 8 weeks, then 1 tablet by mouth once daily. 12/07/20  Yes Johnson, Clanford L, MD  rosuvastatin (CRESTOR) 10 MG tablet TAKE 1 TABLET BY MOUTH AT  BEDTIME  Patient taking differently: Take 10 mg by mouth daily. 04/12/20  Yes Ronnie Doss M, DO  vitamin E 400 UNIT capsule Take 400 Units by mouth daily.    Yes [provider]    Current Facility-Administered Medications  Medication Dose Route Frequency Provider Last Rate Last Admin   0.9 %  sodium chloride infusion (Manually program via Guardrails IV Fluids)   Intravenous Once Zierle-Ghosh, Asia B, DO       acetaminophen (TYLENOL) tablet 650 mg  650 mg Oral Q6H PRN Emokpae, Ejiroghene E, MD   650 mg at 12/10/20  2100   Or   acetaminophen (TYLENOL) suppository 650 mg  650 mg Rectal Q6H PRN Emokpae, Ejiroghene E, MD       melatonin tablet 6 mg  6 mg Oral QHS Emokpae, Ejiroghene E, MD   6 mg at 12/10/20 2100   ondansetron (ZOFRAN) tablet 4 mg  4 mg Oral Q6H PRN Emokpae, Ejiroghene E, MD       Or   ondansetron (ZOFRAN) injection 4 mg  4 mg Intravenous Q6H PRN Emokpae, Ejiroghene E, MD       pantoprazole (PROTONIX) injection 40 mg  40 mg Intravenous Q12H Emokpae, Ejiroghene E, MD   40 mg at 12/10/20 1756   polyethylene glycol (MIRALAX / GLYCOLAX) packet 17 g  17 g Oral Daily PRN Emokpae, Ejiroghene E, MD       rosuvastatin (CRESTOR) tablet 10 mg  10 mg Oral q1800 Emokpae, Ejiroghene E, MD        Allergies as of 12/10/2020 - Review Complete 12/10/2020  Allergen Reaction Noted   Sulfa antibiotics Rash 04/29/2019    Family History  Problem Relation Age of Onset   Colon cancer Mother 80       80's   Breast cancer Sister    Asthma Brother    Colon polyps Neg Hx    Esophageal cancer Neg Hx    Rectal cancer Neg Hx    Stomach cancer Neg Hx     Social History   Socioeconomic History   Marital status: Married    Spouse name: Not on file   Number of children: 1   Years of education: Not on file   Highest education level: Not on file  Occupational History   Occupation: retired  Tobacco Use   Smoking status: Former    Types: Cigarettes    Quit date: 12/23/1984    Years since quitting: 35.9   Smokeless tobacco: Never  Vaping Use   Vaping Use: Never used  Substance and Sexual Activity   Alcohol use: Yes    Comment: rare   Drug use: No   Sexual activity: Not on file  Other Topics Concern   Not on file  Social History Narrative   Patient is married and retired and has 1 grown child   Social Determinants of Radio broadcast assistant Strain: Not on file  Food Insecurity: Not on file  Transportation Needs: Not on file  Physical Activity: Not on file  Stress: Not on file  Social  Connections: Not on file  Intimate Partner Violence: Not on file    Review of Systems: Gen: Denies fever, chills, cold or flulike symptoms, presyncope, syncope. CV: See HPI Resp: See HPI GI: See HPI GU : Denies urinary burning, urinary frequency, urinary incontinence.  MS: Denies joint pain Derm: Denies rash Heme: See HPI  Physical Exam: Vital signs in last 24 hours: Temp:  [97.9 F (36.6 C)-98.8 F (37.1  C)] 98.1 F (36.7 C) (09/19 0439) Pulse Rate:  [74-92] 82 (09/19 0439) Resp:  [13-27] 18 (09/19 0439) BP: (94-121)/(42-86) 107/51 (09/19 0439) SpO2:  [93 %-99 %] 97 % (09/19 0439) Weight:  [83.3 kg] 83.3 kg (09/18 1700) Last BM Date: 12/09/20 General:   Alert,  Well-developed, well-nourished, pleasant and cooperative in NAD Head:  Normocephalic and atraumatic. Eyes:  Sclera clear, no icterus.   Conjunctiva pink. Ears:  Normal auditory acuity. Lungs:  Clear throughout to auscultation.   No wheezes, crackles, or rhonchi. No acute distress. Heart:  Regular rate and rhythm.  Systolic murmur appreciated.  Abdomen:  Soft, nontender and nondistended. No masses, hepatosplenomegaly or hernias noted. Normal bowel sounds, without guarding, and without rebound.   Rectal:  Deferred   Msk:  Symmetrical without gross deformities. Normal posture. Extremities:  With trace LE edema. Neurologic:  Alert and  oriented x4;  grossly normal neurologically. Skin:  Intact without significant lesions or rashes. Psych:  Normal mood and affect.  Intake/Output from previous day: 09/18 0701 - 09/19 0700 In: 676 [Blood:676] Out: -  Intake/Output this shift: No intake/output data recorded.  Lab Results: Recent Labs    12/10/20 1120 12/10/20 2040 12/10/20 2345 12/11/20 0627  WBC 8.7  --   --  5.9  HGB 5.6* 6.4* 6.4* 6.2*  HCT 18.0* 20.0* 19.7* 19.5*  PLT 238  --   --  184   BMET Recent Labs    12/10/20 1120 12/11/20 0627  NA 136 137  K 3.9 3.9  CL 106 108  CO2 25 27  GLUCOSE 119*  103*  BUN 20 17  CREATININE 0.61 0.62  CALCIUM 7.9* 7.8*   LFT Recent Labs    12/10/20 1120  PROT 5.6*  ALBUMIN 3.1*  AST 20  ALT 14  ALKPHOS 22*  BILITOT 0.6   PT/INR Recent Labs    12/10/20 1120  LABPROT 14.2  INR 1.1   Studies/Results: DG Chest Port 1 View  Result Date: 12/10/2020 CLINICAL DATA:  Shortness of breath and weakness over the last 4 days EXAM: PORTABLE CHEST 1 VIEW COMPARISON:  12/04/2020 FINDINGS: Stable mild to moderate enlargement of the cardiopericardial silhouette, without edema. Atherosclerotic calcification of the aortic arch. Thoracic spondylosis. No blunting of the costophrenic angles. Mild degenerative glenohumeral arthropathy bilaterally. IMPRESSION: 1. Stable mild to moderate enlargement of the cardiopericardial silhouette, without edema. 2.  Aortic Atherosclerosis (ICD10-I70.0). 3. Thoracic spondylosis. Electronically Signed   By: Van Clines M.D.   On: 12/10/2020 12:06   CT Angio Abd/Pel W and/or Wo Contrast  Result Date: 12/10/2020 CLINICAL DATA:  GI bleed, weakness EXAM: CTA ABDOMEN AND PELVIS WITHOUT AND WITH CONTRAST TECHNIQUE: Multidetector CT imaging of the abdomen and pelvis was performed using the standard protocol during bolus administration of intravenous contrast. Multiplanar reconstructed images and MIPs were obtained and reviewed to evaluate the vascular anatomy. CONTRAST:  156m OMNIPAQUE IOHEXOL 350 MG/ML SOLN COMPARISON:  CT abdomen pelvis, 11/14/2019, interventional angiogram, 12/05/2020 FINDINGS: VASCULAR Normal contour and caliber of the abdominal aorta. No evidence of aortic aneurysm, dissection, or other acute aortic pathology. Moderate mixed calcific atherosclerosis. Standard branching pattern of the abdominal aorta, with solitary bilateral renal arteries. The branch vessel origins are patent. Coil material within the pancreaticoduodenal and gastroduodenal arteries. There is a saccular pseudoaneurysm arising from the distal right  common femoral artery, aneurysm sac measuring 1.3 cm with a very slender neck (series 9, image 203). Review of the MIP images confirms the above findings. NON-VASCULAR Lower  chest: No acute abnormality.  Coronary artery calcifications. Hepatobiliary: No solid liver abnormality is seen. No gallstones, gallbladder wall thickening, or biliary dilatation. Pancreas: Unremarkable. No pancreatic ductal dilatation or surrounding inflammatory changes. Spleen: Normal in size without significant abnormality. Adrenals/Urinary Tract: Adrenal glands are unremarkable. Kidneys are normal, without renal calculi, solid lesion, or hydronephrosis. Bladder is unremarkable. Stomach/Bowel: Stomach is within normal limits. Appendix appears normal. No evidence of bowel wall thickening, distention, or inflammatory changes. Lymphatic: No enlarged abdominal or pelvic lymph nodes. Reproductive: No mass or other significant abnormality. Other: No abdominal wall hernia or abnormality. No abdominopelvic ascites. Musculoskeletal: No acute or significant osseous findings. IMPRESSION: 1. Normal contour and caliber of the abdominal aorta. No evidence of aortic aneurysm, dissection, or other acute aortic pathology. Moderate mixed calcific atherosclerosis. 2. 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 3. No contrast extravasation within the bowel lumen. 4. Coil material within the pancreaticoduodenal and gastroduodenal arteries, per recent coiling procedure. 5. Coronary artery disease. Aortic Atherosclerosis (ICD10-I70.0). Electronically Signed   By: Eddie Candle M.D.   On: 12/10/2020 13:25    Impression: 75 y.o. year old female with IDA, requiring blood transfusions, followed by Hematology with IV iron, and undergoing extensive GI evaluation since August 2020 as per HPI with AVMs and recently with suspected duodenal dieulafoy lesion s/p IR  embolization on 9/13, discharged on 9/15 with hemoglobin of 7.6. She presented again 9/18 with generalized weakness, shortness of breath, and melena, found to have hemoglobin 5.6.   Acute on chronic symptomatic anemia with melena: Extensive GI evaluation since August 2020 including 4 EGDs, 2 colonoscopies, and 3 capsule studies revealing small AVMs and most recently with suspected dieulafoy lesion in the second portion of the duodenum s/p IR embolization on 9/13, discharged on 9/15.  Unfortunately, patient reports ongoing melanotic stools with worsening shortness of breath with minimal exertion and weakness. Hemoglobin 5.6 on admission, down from 7.6 on 9/15.  No overt bleeding over the last 24 hours.  She has been compliant with PPI twice daily outpatient and avoiding all NSAIDs.  Received 2 unit PRBCs yesterday with hemoglobin only improved to 6.2 today.   Etiology is not entirely clear; however, will need to repeat an EGD to take another look at her upper GI tract and reevaluate the area of prior active bleeding.  She will need an additional 1 unit PRBC transfused prior to EGD and another unit on hold.  1 unit PRBCs has already been ordered, awaiting to be transfused.  I have spoken with nursing staff and requested to start the transfusion ASAP.  I have also spoken with the blood bank and requested an additional unit to be placed on hold.   Plan: Transfuse 1 unit PRBCs now.  Spoke with blood bank.  Requested 1 unit PRBCs to be placed on hold. Post transfusion H/H. I have asked nursing staff to obtain this.  EGD with propofol with Dr. Abbey Chatters. The risks, benefits, and alternatives have been discussed with the patient in detail. The patient states understanding and desires to proceed. Continue NPO status in preparation for EGD.  Further recommendations to follow.   LOS: 0 days    12/11/2020, 8:27 AM   Aliene Altes, PA-C [facility]

## 2020-12-11 NOTE — H&P (View-Only) (Signed)
_0 @   Referring Provider: [facility] hospital[facility] Primary Care Physician:  [name], DO Primary Gastroenterologist: [facility]   Date of Admission: 12/10/2020 Date of Consultation: 12/11/2020  Reason for Consultation: Acute on chronic symptomatic anemia  HPI:  Stacey Reyes is a 75 y.o. year old female with IDA, extensive GI evaluation since August 2020 as listed below with AVMs and recently with suspected duodenal dieulafoy lesion s/p IR embolization, requiring blood transfusions, followed by Hematology with IV iron presenting again this admission with generalized weakness, shortness of breath, reported low blood pressure, and melena.   Most recently admitted 12/04/2020 - 12/07/2020 with acute on chronic symptomatic anemia, associated melena, heme positive stool, and needing transfusion.  She underwent EGD which found multiple gastric polyps, blood in the second portion of the duodenum, unable to localize bleeding site, but suspected Dieulafoy lesion.  She was transferred to [facility] and underwent post mesenteric arteriogram and empiric percutaneous coil embolization of the GDA and a hypertrophied pancreaticoduodenal artery by IR.    ED Course this admission:  Soft BP- 103/47. No tachycardia, O2 98% on RA, afebrile.  CBC with normocytic anemia, hemoglobin of 5.6 (down from 7.6 on 9/15).  Hemoccult positive. CMP with creatinine 0.61, BUN 20, potassium 3.9, sodium 136, glucose 119, calcium 7.9, AST 20, ALT 14, alk phos 22, total bilirubin 0.6. INR 1.1. Chest x-ray unremarkable. 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   She was transfused 2 units PRBCs and started on IV PPI twice daily. CBC this morning with hemoglobin 6.2.    Today:  No energy. Felt very short of breath, weak with little exertion. Worsening since 9/15. Continues with tarry black  stools daily since 9/15, but no BM yesterday or today. No brbpr. Lack of appetite and increased gas. No significant abdominal pain. Denies nausea or vomiting. Gets back pain when she is short of breath. Gets some chest pain and pain in her arms when she is struggling to breathe. Feels like jello. No CP or SOB laying in bed today. Has a mild HA this morning. Taking PPI BID at home.   No NSAIDs. Only Tylenol for HA as needed.    Prior GI evaluation: EGD August 2020: Duodenal erosions biopsied (benign), otherwise normal exam.  Colonoscopy August 2020: 2 polyps removed (tubular adenomas), diverticulosis in the sigmoid, descending, transverse colon, otherwise normal exam. EGD August 2021: Normal esophagus, small hiatal hernia, innocent appearing duodenal erosions. Small bowel capsule August 2021: Benign duodenal erosions without active bleeding, large amounts of old blood in cecum with no obvious source. Colonoscopy November 2021: 7 polyps removed (tubular adenomas), diverticulosis in sigmoid, descending, transverse, and descending colon, erythema and petechia in association with diverticulosis in the left colon biopsies (benign), external and internal hemorrhoids. Givens capsule November 2021: Duodenitis, 1 small AVM in proximal small bowel about 8 minutes from first duodenal image, 1 tiny AVM at 1 hour 19 minutes, numerous tiny lesions versus luminal contents of unclear significance and distal small bowel. EGD July 2022: normal duodenal bulb, first portion of duodenum and second portion of duodenum.  Capsule study July 2022: Duodenitis. No obvious AVMs appreciated. Complete to the cecum. Possible old blood in colon but difficult to visualize due to debris.  EGD 12/05/20:  Multiple gastric polyps.  Blood in the second portion of the duodenum: Dieulafoy lesion suspected.  Use of duodenoscope did not help in localizing bleeding  site.  She underwent post mesenteric arteriogram and empiric percutaneous coil  embolization of the GDA and a hypertrophied pancreaticoduodenal artery by IR in [place].   Past Medical History:  Diagnosis Date   Anemia    Blood transfusion without reported diagnosis    Cataract    removed bilateral   Chronic cystitis    Diverticulitis    Heart murmur    Hyperlipidemia    Hypertension    Personal history of colonic polyps-adenomas 01/07/2012   2009 - 2 diminutive adenomas (prior polyps also) 01/07/2012 - 2 diminutive adenomas      Past Surgical History:  Procedure Laterality Date   BREAST BIOPSY Right    No Scar seen    COLONOSCOPY  multiple   ESOPHAGOGASTRODUODENOSCOPY (EGD) WITH PROPOFOL N/A 11/23/2019   Procedure: ESOPHAGOGASTRODUODENOSCOPY (EGD) WITH PROPOFOL;  Surgeon: Rourk, Robert M, MD;  [facility];  Service: Endoscopy;  Laterality: N/A;   ESOPHAGOGASTRODUODENOSCOPY (EGD) WITH PROPOFOL N/A 10/20/2020   Procedure: ESOPHAGOGASTRODUODENOSCOPY (EGD) WITH PROPOFOL;  Surgeon: Carver, Charles K, DO;  [facility]: [facility];  Service: Endoscopy;  Laterality: N/A;   ESOPHAGOGASTRODUODENOSCOPY (EGD) WITH PROPOFOL N/A 12/05/2020   Procedure: ESOPHAGOGASTRODUODENOSCOPY (EGD) WITH PROPOFOL;  Surgeon: Rehman, Najeeb U, MD;  [facility];  Service: Endoscopy;  Laterality: N/A;  with enteroscopy   GIVENS CAPSULE STUDY N/A 11/23/2019   Procedure: GIVENS CAPSULE STUDY;  Surgeon: Rourk, Robert M, MD;  [facility];  Service: Endoscopy;  Laterality: N/A;   GIVENS CAPSULE STUDY N/A 10/22/2020   Procedure: GIVENS CAPSULE STUDY;  Surgeon: Carver, Charles K, DO;  [facility]: [facility];  Service: Endoscopy;  Laterality: N/A;   IR ANGIOGRAM SELECTIVE EACH ADDITIONAL VESSEL  12/05/2020   IR ANGIOGRAM SELECTIVE EACH ADDITIONAL VESSEL  12/05/2020   IR ANGIOGRAM SELECTIVE EACH ADDITIONAL VESSEL  12/05/2020   IR ANGIOGRAM VISCERAL SELECTIVE  12/05/2020   IR EMBO ART  VEN HEMORR LYMPH EXTRAV  INC GUIDE ROADMAPPING  12/05/2020   IR US GUIDE VASC ACCESS  RIGHT  12/05/2020    Prior to Admission medications   Medication Sig Start Date End Date Taking? Authorizing Provider  Ascorbic Acid (VITAMIN C) 1000 MG tablet Take 1,000 mg by mouth daily.   Yes [provider]  bisoprolol (ZEBETA) 5 MG tablet Take 0.5 tablets (2.5 mg total) by mouth daily. 10/27/20  Yes Lawrence, Kathryn M, NP  clobetasol ointment (TEMOVATE) 0.05 % Apply 1 application topically 2 (two) times daily as needed (skin). 10/26/20  Yes [provider]  Copper Gluconate (COPPER CAPS PO) Take 1 capsule by mouth daily.    Yes [provider]  cyanocobalamin ([employer] VITAMIN B12) 2000 MCG tablet Take 1 tablet (2,000 mcg total) by mouth daily. 03/25/19  Yes Johnson, Clanford L, MD  Magnesium 200 MG TABS Take 1 tablet (200 mg total) by mouth daily. 03/23/19  Yes Lawrence, Kathryn M, NP  Multiple Minerals-Vitamins (CALCIUM-MAGNESIUM-ZINC-D3 PO) Take 1 tablet by mouth daily.   Yes [provider]  Multiple Vitamin (MULTIVITAMIN) capsule Take 1 capsule by mouth daily.    Yes [provider]  pantoprazole (PROTONIX) 40 MG tablet 1 po BID x 8 weeks, then 1 po daily Patient taking differently: Take 40 mg by mouth See admin instructions. Starting 9.15.22, take 1 tablet by mouth twice daily for 8 weeks, then 1 tablet by mouth once daily. 12/07/20  Yes Johnson, Clanford L, MD  rosuvastatin (CRESTOR) 10 MG tablet TAKE 1 TABLET BY MOUTH AT  BEDTIME   Patient taking differently: Take 10 mg by mouth daily. 04/12/20  Yes Ronnie Doss M, DO  vitamin E 400 UNIT capsule Take 400 Units by mouth daily.    Yes [provider]    Current Facility-Administered Medications  Medication Dose Route Frequency Provider Last Rate Last Admin   0.9 %  sodium chloride infusion (Manually program via Guardrails IV Fluids)   Intravenous Once Zierle-Ghosh, Asia B, DO       acetaminophen (TYLENOL) tablet 650 mg  650 mg Oral Q6H PRN Emokpae, Ejiroghene E, MD   650 mg at 12/10/20  2100   Or   acetaminophen (TYLENOL) suppository 650 mg  650 mg Rectal Q6H PRN Emokpae, Ejiroghene E, MD       melatonin tablet 6 mg  6 mg Oral QHS Emokpae, Ejiroghene E, MD   6 mg at 12/10/20 2100   ondansetron (ZOFRAN) tablet 4 mg  4 mg Oral Q6H PRN Emokpae, Ejiroghene E, MD       Or   ondansetron (ZOFRAN) injection 4 mg  4 mg Intravenous Q6H PRN Emokpae, Ejiroghene E, MD       pantoprazole (PROTONIX) injection 40 mg  40 mg Intravenous Q12H Emokpae, Ejiroghene E, MD   40 mg at 12/10/20 1756   polyethylene glycol (MIRALAX / GLYCOLAX) packet 17 g  17 g Oral Daily PRN Emokpae, Ejiroghene E, MD       rosuvastatin (CRESTOR) tablet 10 mg  10 mg Oral q1800 Emokpae, Ejiroghene E, MD        Allergies as of 12/10/2020 - Review Complete 12/10/2020  Allergen Reaction Noted   Sulfa antibiotics Rash 04/29/2019    Family History  Problem Relation Age of Onset   Colon cancer Mother 46       80's   Breast cancer Sister    Asthma Brother    Colon polyps Neg Hx    Esophageal cancer Neg Hx    Rectal cancer Neg Hx    Stomach cancer Neg Hx     Social History   Socioeconomic History   Marital status: Married    Spouse name: Not on file   Number of children: 1   Years of education: Not on file   Highest education level: Not on file  Occupational History   Occupation: retired  Tobacco Use   Smoking status: Former    Types: Cigarettes    Quit date: 12/23/1984    Years since quitting: 35.9   Smokeless tobacco: Never  Vaping Use   Vaping Use: Never used  Substance and Sexual Activity   Alcohol use: Yes    Comment: rare   Drug use: No   Sexual activity: Not on file  Other Topics Concern   Not on file  Social History Narrative   Patient is married and retired and has 1 grown child   Social Determinants of Radio broadcast assistant Strain: Not on file  Food Insecurity: Not on file  Transportation Needs: Not on file  Physical Activity: Not on file  Stress: Not on file  Social  Connections: Not on file  Intimate Partner Violence: Not on file    Review of Systems: Gen: Denies fever, chills, cold or flulike symptoms, presyncope, syncope. CV: See HPI Resp: See HPI GI: See HPI GU : Denies urinary burning, urinary frequency, urinary incontinence.  MS: Denies joint pain Derm: Denies rash Heme: See HPI  Physical Exam: Vital signs in last 24 hours: Temp:  [97.9 F (36.6 C)-98.8 F (37.1  C)] 98.1 F (36.7 C) (09/19 0439) Pulse Rate:  [74-92] 82 (09/19 0439) Resp:  [13-27] 18 (09/19 0439) BP: (94-121)/(42-86) 107/51 (09/19 0439) SpO2:  [93 %-99 %] 97 % (09/19 0439) Weight:  [83.3 kg] 83.3 kg (09/18 1700) Last BM Date: 12/09/20 General:   Alert,  Well-developed, well-nourished, pleasant and cooperative in NAD Head:  Normocephalic and atraumatic. Eyes:  Sclera clear, no icterus.   Conjunctiva pink. Ears:  Normal auditory acuity. Lungs:  Clear throughout to auscultation.   No wheezes, crackles, or rhonchi. No acute distress. Heart:  Regular rate and rhythm.  Systolic murmur appreciated.  Abdomen:  Soft, nontender and nondistended. No masses, hepatosplenomegaly or hernias noted. Normal bowel sounds, without guarding, and without rebound.   Rectal:  Deferred   Msk:  Symmetrical without gross deformities. Normal posture. Extremities:  With trace LE edema. Neurologic:  Alert and  oriented x4;  grossly normal neurologically. Skin:  Intact without significant lesions or rashes. Psych:  Normal mood and affect.  Intake/Output from previous day: 09/18 0701 - 09/19 0700 In: 676 [Blood:676] Out: -  Intake/Output this shift: No intake/output data recorded.  Lab Results: Recent Labs    12/10/20 1120 12/10/20 2040 12/10/20 2345 12/11/20 0627  WBC 8.7  --   --  5.9  HGB 5.6* 6.4* 6.4* 6.2*  HCT 18.0* 20.0* 19.7* 19.5*  PLT 238  --   --  184   BMET Recent Labs    12/10/20 1120 12/11/20 0627  NA 136 137  K 3.9 3.9  CL 106 108  CO2 25 27  GLUCOSE 119*  103*  BUN 20 17  CREATININE 0.61 0.62  CALCIUM 7.9* 7.8*   LFT Recent Labs    12/10/20 1120  PROT 5.6*  ALBUMIN 3.1*  AST 20  ALT 14  ALKPHOS 22*  BILITOT 0.6   PT/INR Recent Labs    12/10/20 1120  LABPROT 14.2  INR 1.1   Studies/Results: DG Chest Port 1 View  Result Date: 12/10/2020 CLINICAL DATA:  Shortness of breath and weakness over the last 4 days EXAM: PORTABLE CHEST 1 VIEW COMPARISON:  12/04/2020 FINDINGS: Stable mild to moderate enlargement of the cardiopericardial silhouette, without edema. Atherosclerotic calcification of the aortic arch. Thoracic spondylosis. No blunting of the costophrenic angles. Mild degenerative glenohumeral arthropathy bilaterally. IMPRESSION: 1. Stable mild to moderate enlargement of the cardiopericardial silhouette, without edema. 2.  Aortic Atherosclerosis (ICD10-I70.0). 3. Thoracic spondylosis. Electronically Signed   By: Walter  Liebkemann M.D.   On: 12/10/2020 12:06   CT Angio Abd/Pel W and/or Wo Contrast  Result Date: 12/10/2020 CLINICAL DATA:  GI bleed, weakness EXAM: CTA ABDOMEN AND PELVIS WITHOUT AND WITH CONTRAST TECHNIQUE: Multidetector CT imaging of the abdomen and pelvis was performed using the standard protocol during bolus administration of intravenous contrast. Multiplanar reconstructed images and MIPs were obtained and reviewed to evaluate the vascular anatomy. CONTRAST:  100mL OMNIPAQUE IOHEXOL 350 MG/ML SOLN COMPARISON:  CT abdomen pelvis, 11/14/2019, interventional angiogram, 12/05/2020 FINDINGS: VASCULAR Normal contour and caliber of the abdominal aorta. No evidence of aortic aneurysm, dissection, or other acute aortic pathology. Moderate mixed calcific atherosclerosis. Standard branching pattern of the abdominal aorta, with solitary bilateral renal arteries. The branch vessel origins are patent. Coil material within the pancreaticoduodenal and gastroduodenal arteries. There is a saccular pseudoaneurysm arising from the distal right  common femoral artery, aneurysm sac measuring 1.3 cm with a very slender neck (series 9, image 203). Review of the MIP images confirms the above findings. NON-VASCULAR Lower   chest: No acute abnormality.  Coronary artery calcifications. Hepatobiliary: No solid liver abnormality is seen. No gallstones, gallbladder wall thickening, or biliary dilatation. Pancreas: Unremarkable. No pancreatic ductal dilatation or surrounding inflammatory changes. Spleen: Normal in size without significant abnormality. Adrenals/Urinary Tract: Adrenal glands are unremarkable. Kidneys are normal, without renal calculi, solid lesion, or hydronephrosis. Bladder is unremarkable. Stomach/Bowel: Stomach is within normal limits. Appendix appears normal. No evidence of bowel wall thickening, distention, or inflammatory changes. Lymphatic: No enlarged abdominal or pelvic lymph nodes. Reproductive: No mass or other significant abnormality. Other: No abdominal wall hernia or abnormality. No abdominopelvic ascites. Musculoskeletal: No acute or significant osseous findings. IMPRESSION: 1. Normal contour and caliber of the abdominal aorta. No evidence of aortic aneurysm, dissection, or other acute aortic pathology. Moderate mixed calcific atherosclerosis. 2. 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 3. No contrast extravasation within the bowel lumen. 4. Coil material within the pancreaticoduodenal and gastroduodenal arteries, per recent coiling procedure. 5. Coronary artery disease. Aortic Atherosclerosis (ICD10-I70.0). Electronically Signed   By: Alex  Bibbey M.D.   On: 12/10/2020 13:25    Impression: 74 y.o. year old female with IDA, requiring blood transfusions, followed by Hematology with IV iron, and undergoing extensive GI evaluation since August 2020 as per HPI with AVMs and recently with suspected duodenal dieulafoy lesion s/p IR  embolization on 9/13, discharged on 9/15 with hemoglobin of 7.6. She presented again 9/18 with generalized weakness, shortness of breath, and melena, found to have hemoglobin 5.6.   Acute on chronic symptomatic anemia with melena: Extensive GI evaluation since August 2020 including 4 EGDs, 2 colonoscopies, and 3 capsule studies revealing small AVMs and most recently with suspected dieulafoy lesion in the second portion of the duodenum s/p IR embolization on 9/13, discharged on 9/15.  Unfortunately, patient reports ongoing melanotic stools with worsening shortness of breath with minimal exertion and weakness. Hemoglobin 5.6 on admission, down from 7.6 on 9/15.  No overt bleeding over the last 24 hours.  She has been compliant with PPI twice daily outpatient and avoiding all NSAIDs.  Received 2 unit PRBCs yesterday with hemoglobin only improved to 6.2 today.   Etiology is not entirely clear; however, will need to repeat an EGD to take another look at her upper GI tract and reevaluate the area of prior active bleeding.  She will need an additional 1 unit PRBC transfused prior to EGD and another unit on hold.  1 unit PRBCs has already been ordered, awaiting to be transfused.  I have spoken with nursing staff and requested to start the transfusion ASAP.  I have also spoken with the blood bank and requested an additional unit to be placed on hold.   Plan: Transfuse 1 unit PRBCs now.  Spoke with blood bank.  Requested 1 unit PRBCs to be placed on hold. Post transfusion H/H. I have asked nursing staff to obtain this.  EGD with propofol with Dr. Carver. The risks, benefits, and alternatives have been discussed with the patient in detail. The patient states understanding and desires to proceed. Continue NPO status in preparation for EGD.  Further recommendations to follow.   LOS: 0 days    12/11/2020, 8:27 AM   Denean Pavon, PA-C [facility]   

## 2020-12-11 NOTE — Anesthesia Preprocedure Evaluation (Signed)
Anesthesia Evaluation  Patient identified by MRN, date of birth, ID band Patient awake    Reviewed: Allergy & Precautions, H&P , NPO status , Patient's Chart, lab work & pertinent test results, reviewed documented beta blocker date and time   Airway Mallampati: II  TM Distance: >3 FB Neck ROM: full    Dental no notable dental hx. (+) Caps, Dental Advisory Given   Pulmonary neg pulmonary ROS, former smoker,    Pulmonary exam normal breath sounds clear to auscultation       Cardiovascular Exercise Tolerance: Good hypertension, + Valvular Problems/Murmurs MR  Rhythm:regular Rate:Normal     Neuro/Psych  Headaches, negative psych ROS   GI/Hepatic negative GI ROS, Neg liver ROS,   Endo/Other  negative endocrine ROS  Renal/GU negative Renal ROS  negative genitourinary   Musculoskeletal   Abdominal   Peds  Hematology  (+) Blood dyscrasia, anemia ,   Anesthesia Other Findings 1. Hypertrophic cardiomyopathy. Systolic anterior motion of the mitral  valve is noted. Significant intracavitary gradient noted. Peak velocity  5.1 m/s. Peak gradient 104.2 mmHg. Left ventricular ejection fraction, by  estimation, is >75%. The left  ventricle has hyperdynamic function. The left ventricle has no regional  wall motion abnormalities. There is severe concentric left ventricular  hypertrophy. Left ventricular diastolic parameters are consistent with  Grade I diastolic dysfunction (impaired  relaxation). Elevated left ventricular end-diastolic pressure.  2. Right ventricular systolic function is normal. The right ventricular  size is normal.  3. Left atrial size was severely dilated.  4. The mitral valve is normal in structure. Mild mitral valve  regurgitation. No evidence of mitral stenosis.  5. The aortic valve is calcified. There is mild calcification of the  aortic valve. There is mild thickening of the aortic valve. Aortic valve   regurgitation is not visualized. Mild aortic valve stenosis. Aortic valve  mean gradient measures 14.2 mmHg.  Aortic valve Vmax measures 2.59 m/s.  6. Aortic dilatation noted. There is mild dilatation of the ascending  aorta, measuring 37 mm.  7. The inferior vena cava is normal in size with greater than 50%  respiratory variability, suggesting right atrial pressure of 3 mmHg.   Reproductive/Obstetrics negative OB ROS                             Anesthesia Physical  Anesthesia Plan  ASA: 4 and emergent  Anesthesia Plan: General   Post-op Pain Management:    Induction:   PONV Risk Score and Plan: Propofol infusion  Airway Management Planned:   Additional Equipment:   Intra-op Plan:   Post-operative Plan:   Informed Consent: I have reviewed the patients History and Physical, chart, labs and discussed the procedure including the risks, benefits and alternatives for the proposed anesthesia with the patient or authorized representative who has indicated his/her understanding and acceptance.     Dental Advisory Given  Plan Discussed with: CRNA  Anesthesia Plan Comments:         Anesthesia Quick Evaluation

## 2020-12-11 NOTE — Anesthesia Postprocedure Evaluation (Signed)
Anesthesia Post Note  Patient: Stacey Reyes  Procedure(s) Performed: ESOPHAGOGASTRODUODENOSCOPY (EGD) WITH PROPOFOL HEMOSTASIS CLIP PLACEMENT  Patient location during evaluation: Phase II Anesthesia Type: General Level of consciousness: awake Pain management: pain level controlled Vital Signs Assessment: post-procedure vital signs reviewed and stable Respiratory status: spontaneous breathing and respiratory function stable Cardiovascular status: blood pressure returned to baseline and stable Postop Assessment: no headache and no apparent nausea or vomiting Anesthetic complications: no Comments: Late entry   No notable events documented.   Last Vitals:  Vitals:   12/11/20 1413 12/11/20 1421  BP: 130/75 (!) 152/57  Pulse: 77 76  Resp: 20 20  Temp: 36.8 C 36.8 C  SpO2: 97% 96%    Last Pain:  Vitals:   12/11/20 1421  TempSrc: Oral  PainSc: 0-No pain                 Louann Sjogren

## 2020-12-11 NOTE — Progress Notes (Signed)
PROGRESS NOTE    Stacey Reyes  [id number] DOB: [birth year] DOA: 12/10/2020 PCP: [name], DO   Chief Complaint  Patient presents with   Shortness of Breath    Brief admission narrative:  As Per H&P written by Dr. Denton Brick on 12/10/2020 Stacey Reyes is a 75 y.o. female with medical history significant for  GI bleed, HTN, hypertrophic cardiomyopathy. Patient presented to the ED with c/o generalized weakness and difficulty breathing with activity, and dizziness when standing.  She reports her extremities feeling like jelly when she exerts herself.    Recently hospitalized 9/12- 9/15 for Acute on chronic blood loss anemia likely due to bleeding Dieulafoy lesion or diverticulum. Transfused with 4u of blood. EGD- showed Blood in the second portion of the duodenum; Dieulafoy suspected. Use of duodenoscope did not help in localizing bleeding site, hence she was transferred to [facility] angiogram with embolization by IR.   Patient reports since she has been discharged, all of her bowel movements daily have looked like blood.  She has not seen any blood in her stool.  No vomiting , no abdominal pain.  Takes Tylenol only for pain, denies NSAID use.   ED Course: Blood pressure systolic [id number] O2 sats percent on room air.  Hemoglobin 5.6.  Chest x-ray without acute abnormality.  He needs PRBC ordered for transfusion. EDP consulted gastroenterologist Dr. Abbey Chatters, CTA was recommended, which did not reveal source of bleed. Hospitalist to admit  Assessment & Plan: 1-Acute blood loss anemia in the setting of upper GI bleed. -Status post endoscopic evaluation demonstrating active GI bleed with oozing vessel; per GI status post Hemospray and clipping.  Case discussed with interventional radiology who is recommending embolization.  Patient will be transferred to [facility] for further evaluation and management by IR. -Status post 4 units of PRBCs; hemoglobin was 7.2 prior to last  transfusion.  Repeat hemoglobin pending currently. -Patient may end up requiring further transfusion to maintain hemoglobin above 7. -Continue IV PPI and n.p.o. status.  2-hyperlipidemia -Continue Crestor.  3-hypertension -Holding bisoprolol in the setting of acute bleeding. -continue to follow vital signs.  4-hypertrophic cardiomyopathy -Continue to follow daily weights -Encourage to pursued low-sodium diet -Continue blood pressure control.   DVT prophylaxis: SCDs Code Status: Full code Family Communication: No family at bedside. Disposition:   Status is: Inpatient status  Dispo: The patient is from: Home              Anticipated d/c is to: Home              Patient currently is not medically stable to d/c.   Difficult to place patient No       Consultants:  Gastroenterology service  Procedures:  Endoscopy  Antimicrobials:  None   Subjective: Reports feeling weak and tired; no chest pain, no nausea, no vomiting, no shortness of breath.    Objective: Vitals:   12/11/20 1315 12/11/20 1345 12/11/20 1413 12/11/20 1421  BP: 136/76 (!) 121/47 130/75 (!) 152/57  Pulse: 96 82 77 76  Resp: 17 (!) '22 20 20  '$ Temp: 97.7 F (36.5 C)  98.2 F (36.8 C) 98.3 F (36.8 C)  TempSrc: Oral  Oral Oral  SpO2: 96% 95% 97% 96%  Weight:      Height:        Intake/Output Summary (Last 24 hours) at 12/11/2020 1439 Last data filed at 12/11/2020 1314 Gross per 24 hour  Intake 1361 ml  Output --  Net  1361 ml   Filed Weights   12/10/20 1700  Weight: 83.3 kg    Examination:  General exam: Appears calm and in no acute distress; patient reports no chest pain, no shortness of breath, no nausea or vomiting.  Expressed feeling weak and tired. Respiratory system: Clear to auscultation. Respiratory effort normal. Cardiovascular system: S1 & S2 heard, RRR. No JVD,  rubs or gallops.  Positive systolic murmur.  No pedal edema. Gastrointestinal system: Abdomen is nondistended, soft  and nontender. No organomegaly or masses felt. Normal bowel sounds heard. Central nervous system: Alert and oriented. No focal neurological deficits. Extremities: Cyanosis or clubbing. Skin: No petechiae. Psychiatry: Judgement and insight appear normal. Mood & affect appropriate.     Data Reviewed: I have personally reviewed following labs and imaging studies  CBC: Recent Labs  Lab 12/05/20 0449 12/05/20 2132 12/06/20 0438 12/06/20 1439 12/07/20 0437 12/10/20 1120 12/10/20 2040 12/10/20 2345 12/11/20 0627  WBC 5.8  --  8.3  --  7.4 8.7  --   --  5.9  NEUTROABS  --   --   --   --   --  6.0  --   --   --   HGB 8.3*   < > 7.5*   < > 7.6* 5.6* 6.4* 6.4* 6.2*  HCT 25.4*   < > 23.2*   < > 23.3* 18.0* 20.0* 19.7* 19.5*  MCV 95.5  --  97.1  --  97.5 99.4  --   --  96.1  PLT 149*  --  165  --  157 238  --   --  184   < > = values in this interval not displayed.    Basic Metabolic Panel: Recent Labs  Lab 12/05/20 0449 12/06/20 0438 12/10/20 1120 12/11/20 0627  NA 136 137 136 137  K 3.6 3.5 3.9 3.9  CL 107 106 106 108  CO2 '24 26 25 27  '$ GLUCOSE 101* 91 119* 103*  BUN '21 19 20 17  '$ CREATININE 0.66 0.62 0.61 0.62  CALCIUM 7.8* 8.2* 7.9* 7.8*  MG 1.8 1.8  --   --     GFR: Estimated Creatinine Clearance: 64.7 mL/min (by C-G formula based on SCr of 0.62 mg/dL).  Liver Function Tests: Recent Labs  Lab 12/10/20 1120  AST 20  ALT 14  ALKPHOS 22*  BILITOT 0.6  PROT 5.6*  ALBUMIN 3.1*    CBG: No results for input(s): GLUCAP in the last 168 hours.   Recent Results (from the past 240 hour(s))  SARS CORONAVIRUS 2 (TAT 6-24 HRS) Nasopharyngeal Nasopharyngeal Swab     Status: None   Collection Time: 12/04/20  2:30 PM   Specimen: Nasopharyngeal Swab  Result Value Ref Range Status   SARS Coronavirus 2 NEGATIVE NEGATIVE Final    Comment: (NOTE) SARS-CoV-2 target nucleic acids are NOT DETECTED.  The SARS-CoV-2 RNA is generally detectable in upper and lower respiratory  specimens during the acute phase of infection. Negative results do not preclude SARS-CoV-2 infection, do not rule out co-infections with other pathogens, and should not be used as the sole basis for treatment or other patient management decisions. Negative results must be combined with clinical observations, patient history, and epidemiological information. The expected result is Negative.  Fact Sheet for Patients: SugarRoll.be  Fact Sheet for Healthcare Providers: https://www.woods-mathews.com/  This test is not yet approved or cleared by the [place] FDA and  has been authorized for detection and/or diagnosis of SARS-CoV-2 by FDA under an  Emergency Use Authorization (EUA). This EUA will remain  in effect (meaning this test can be used) for the duration of the COVID-19 declaration under Se ction 564(b)(1) of the Act, 21 U.S.C. section 360bbb-3(b)(1), unless the authorization is terminated or revoked sooner.  Performed at [facility], [place] [address] [place], [place] [address]   MRSA Next Gen by PCR, Nasal     Status: None   Collection Time: 12/05/20  8:37 PM   Specimen: Nasal Mucosa; Nasal Swab  Result Value Ref Range Status   MRSA by PCR Next Gen NOT DETECTED NOT DETECTED Final    Comment: (NOTE) The GeneXpert MRSA Assay (FDA approved for NASAL specimens only), is one component of a comprehensive MRSA colonization surveillance program. It is not intended to diagnose MRSA infection nor to guide or monitor treatment for MRSA infections. Test performance is not FDA approved in patients less than 59 years old. Performed at [facility], [address], [place], [place] [address]      Radiology Studies: [facility] Chest [facility] 1 View  Result Date: 12/10/2020 CLINICAL DATA:  Shortness of breath and weakness over the last 4 days EXAM: PORTABLE CHEST 1 VIEW COMPARISON:  12/04/2020 FINDINGS: Stable mild to moderate enlargement of the  cardiopericardial silhouette, without edema. Atherosclerotic calcification of the aortic arch. Thoracic spondylosis. No blunting of the costophrenic angles. Mild degenerative glenohumeral arthropathy bilaterally. IMPRESSION: 1. Stable mild to moderate enlargement of the cardiopericardial silhouette, without edema. 2.  Aortic Atherosclerosis (ICD10-I70.0). 3. Thoracic spondylosis. Electronically Signed   By: Van Clines M.D.   On: 12/10/2020 12:06   CT Angio Abd/Pel W and/or Wo Contrast  Result Date: 12/10/2020 CLINICAL DATA:  GI bleed, weakness EXAM: CTA ABDOMEN AND PELVIS WITHOUT AND WITH CONTRAST TECHNIQUE: Multidetector CT imaging of the abdomen and pelvis was performed using the standard protocol during bolus administration of intravenous contrast. Multiplanar reconstructed images and MIPs were obtained and reviewed to evaluate the vascular anatomy. CONTRAST:  166m OMNIPAQUE IOHEXOL 350 MG/ML SOLN COMPARISON:  CT abdomen pelvis, 11/14/2019, interventional angiogram, 12/05/2020 FINDINGS: VASCULAR Normal contour and caliber of the abdominal aorta. No evidence of aortic aneurysm, dissection, or other acute aortic pathology. Moderate mixed calcific atherosclerosis. Standard branching pattern of the abdominal aorta, with solitary bilateral renal arteries. The branch vessel origins are patent. Coil material within the pancreaticoduodenal and gastroduodenal arteries. 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 Review of the MIP images confirms the above findings. NON-VASCULAR Lower chest: No acute abnormality.  Coronary artery calcifications. Hepatobiliary: No solid liver abnormality is seen. No gallstones, gallbladder wall thickening, or biliary dilatation. Pancreas: Unremarkable. No pancreatic ductal dilatation or surrounding inflammatory changes. Spleen: Normal in size without  significant abnormality. Adrenals/Urinary Tract: Adrenal glands are unremarkable. Kidneys are normal, without renal calculi, solid lesion, or hydronephrosis. Bladder is unremarkable. Stomach/Bowel: Stomach is within normal limits. Appendix appears normal. No evidence of bowel wall thickening, distention, or inflammatory changes. Lymphatic: No enlarged abdominal or pelvic lymph nodes. Reproductive: No mass or other significant abnormality. Other: No abdominal wall hernia or abnormality. No abdominopelvic ascites. Musculoskeletal: No acute or significant osseous findings. IMPRESSION: 1. Normal contour and caliber of the abdominal aorta. No evidence of aortic aneurysm, dissection, or other acute aortic pathology. Moderate mixed calcific atherosclerosis. 2. 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 3. No contrast extravasation within the bowel lumen. 4. Coil material within the pancreaticoduodenal and gastroduodenal arteries, per recent coiling procedure. 5. Coronary artery disease. Aortic Atherosclerosis (ICD10-I70.0). Electronically Signed   By: Eddie Candle M.D.   On: 12/10/2020 13:25    Scheduled Meds:  sodium chloride   Intravenous Once   melatonin  6 mg Oral QHS   pantoprazole (PROTONIX) IV  40 mg Intravenous Q12H   rosuvastatin  10 mg Oral q1800   Continuous Infusions:   LOS: 0 days    Time spent: 35 minutes    Barton Dubois, MD [facility]   To contact the attending provider between 7A-7P or the covering provider during after hours 7P-7A, please log into the web site [email or web address] and access using universal [place] password for that web site. If you do not have the password, please call the hospital operator.  12/11/2020, 2:39 PM

## 2020-12-11 NOTE — Anesthesia Procedure Notes (Signed)
Procedure Name: Intubation Date/Time: 12/11/2020 12:25 PM Performed by: Tacy Learn, CRNA Pre-anesthesia Checklist: Patient identified, Emergency Drugs available, Suction available, Patient being monitored and Timeout performed Patient Re-evaluated:Patient Re-evaluated prior to induction Oxygen Delivery Method: Circle system utilized Preoxygenation: Pre-oxygenation with 100% oxygen Induction Type: IV induction Laryngoscope Size: Miller and 2 Grade View: Grade II Tube type: Oral Tube size: 6.5 mm Number of attempts: 1 Airway Equipment and Method: Stylet Placement Confirmation: ETT inserted through vocal cords under direct vision, positive ETCO2, CO2 detector and breath sounds checked- equal and bilateral Secured at: 21 cm Tube secured with: Tape Dental Injury: Teeth and Oropharynx as per pre-operative assessment

## 2020-12-11 NOTE — Plan of Care (Signed)

## 2020-12-11 NOTE — Op Note (Signed)
[facility] Patient Name: Stacey Reyes Procedure Date: 12/11/2020 12:08 PM MRN: [id number] Date of Birth: 1946/03/14 Attending MD: Elon Alas. Abbey Chatters DO CSN: [id number] Age: 75 Admit Type: Inpatient Procedure:                Upper GI endoscopy Indications:              Acute post hemorrhagic anemia, Melena Providers:                Elon Alas. Abbey Chatters, DO, Caprice Kluver, Clenton Pare, Technician Referring MD:              Medicines:                General Anesthesia Complications:            No immediate complications. Estimated Blood Loss:     Estimated blood loss: 50 mL. Procedure:                Pre-Anesthesia Assessment:                           - The anesthesia plan was to use monitored                            anesthesia care (MAC).                           After obtaining informed consent, the endoscope was                            passed under direct vision. Throughout the                            procedure, the patient's blood pressure, pulse, and                            oxygen saturations were monitored continuously. The                            [id number]-H190 ([id number]) scope was introduced through the                            mouth, and advanced to the fourth part of duodenum.                            The upper GI endoscopy was technically difficult                            and complex. The patient tolerated the procedure                            well. Scope In: 12:20:46 PM Scope Out: 1:05:32 PM Total Procedure Duration: 0 hours 44 minutes 46 seconds  Findings:      There is no endoscopic evidence of bleeding, areas of erosion or  esophagitis in the entire esophagus.      The entire examined stomach was normal.      Red blood was found in the duodenal bulb, in the first portion of the       duodenum and in the second portion of the duodenum. Appeared to be       active bleeding in the 3rd-4th  portion of the duodenum which I could not       reach with endoscope. Endoscope was then withdrawn and decision was made       to intubate patient and switch to pediatric colonoscope. Patient was       intubated by anesthesia and procedure was resumed with pediatric       colonoscope. In the 3rd-4th portion of the duodenum there was pulsating       vessel with active bleeding consistent with Dieulafoy lesion.      One Dieulafoy lesion with active bleeding was found in the fourth       portion of the duodenum. Area was irrigated and subsequently injected       with 5 mL of 1-10,000 solution of epinephrine for hemostasis which was       unsuccessful. I then attempted to place 3 hemostatic clips which       appeared to slow down the bleeding but also unsuccessful. Decision was       then made to utilize hemostatic spray which was successful in stopping       the active bleed. Impression:               - Normal stomach.                           - Blood in the duodenal bulb and in the second                            portion of the duodenum.                           - One bleeding angiodysplastic lesion in the                            duodenum. Treatment not successful. Clips (MR                            conditional) were placed. hemostatic spray applied.                           - No specimens collected. Moderate Sedation:      Per Anesthesia Care Recommendation:           - Return patient to hospital ward for ongoing care.                           - NPO.                           - Another unit of PRBCs was started during                            procedure when active bleeding identified. Continue  to monitor and transfuse for hemoglobin less than 7                            or hemodynamic changes.                           - We will discuss case with interventional                            radiology. Further recommendations to follow. Procedure  Code(s):        --- Professional ---                           [phone], Esophagogastroduodenoscopy, flexible,                            transoral; with control of bleeding, any method Diagnosis Code(s):        --- Professional ---                           K92.2, Gastrointestinal hemorrhage, unspecified                           K31.811, Angiodysplasia of stomach and duodenum                            with bleeding                           D62, Acute posthemorrhagic anemia                           K92.1, Melena (includes Hematochezia) CPT copyright 2019 American Medical Association. All rights reserved. The codes documented in this report are preliminary and upon coder review may  be revised to meet current compliance requirements. Elon Alas. Abbey Chatters, DO [place] Abbey Chatters, DO 12/11/2020 1:30:13 PM This report has been signed electronically. Number of Addenda: 0

## 2020-12-11 NOTE — Interval H&P Note (Signed)
History and Physical Interval Note:  12/11/2020 11:13 AM  Stacey Reyes  has presented today for surgery, with the diagnosis of Acute on chronic symptomatic anemia, melena.  The various methods of treatment have been discussed with the patient and family. After consideration of risks, benefits and other options for treatment, the patient has consented to  Procedure(s): ESOPHAGOGASTRODUODENOSCOPY (EGD) WITH PROPOFOL (N/A) as a surgical intervention.  The patient's history has been reviewed, patient examined, no change in status, stable for surgery.  I have reviewed the patient's chart and labs.  Questions were answered to the patient's satisfaction.     Stacey Reyes

## 2020-12-11 NOTE — Progress Notes (Signed)
64 y,o. Female inpatient at [facility]. History of chronic intermittent bleed, HTN, hypertropic cardiomyopathy. Presented tot he ED at [facility] with generalized weakness. Found to have a acute bleeding at the level of duodenum during endoscopy on 9.14.22. IR performed a prophylactic coil embolization of there GDA and the pancreaticoduodenal artery on 9.14.22. CT angio abd pelvis from 9.18.22 shows no active extravasation and a saccular pseudoaneurysm. Patient taken back to endoscopy on 9.19.22 where evidence of active bleeding was visualized.  Hgb 7.2 s/p 4 units of PRBC.   IR consulted for possible repeat mesenteric angiogram with possible embolization and pseudoaneurysm thrombin injection. Case has been reviewed and procedure approved by Dr. Serafina Royals.  Patient tentatively scheduled for 9.20.22.  Team instructed to: Keep Patient to be NPO after midnight  Transport patient to [facility]

## 2020-12-12 ENCOUNTER — Encounter (HOSPITAL_COMMUNITY): Payer: Self-pay | Admitting: Internal Medicine

## 2020-12-12 ENCOUNTER — Ambulatory Visit: Payer: Medicare Other | Admitting: Family Medicine

## 2020-12-12 DIAGNOSIS — K921 Melena: Secondary | ICD-10-CM | POA: Diagnosis not present

## 2020-12-12 DIAGNOSIS — Z6831 Body mass index (BMI) 31.0-31.9, adult: Secondary | ICD-10-CM | POA: Diagnosis not present

## 2020-12-12 DIAGNOSIS — Z800 Family history of malignant neoplasm of digestive organs: Secondary | ICD-10-CM | POA: Diagnosis not present

## 2020-12-12 DIAGNOSIS — I10 Essential (primary) hypertension: Secondary | ICD-10-CM | POA: Diagnosis present

## 2020-12-12 DIAGNOSIS — K922 Gastrointestinal hemorrhage, unspecified: Secondary | ICD-10-CM | POA: Diagnosis present

## 2020-12-12 DIAGNOSIS — Z882 Allergy status to sulfonamides status: Secondary | ICD-10-CM | POA: Diagnosis not present

## 2020-12-12 DIAGNOSIS — Z20822 Contact with and (suspected) exposure to covid-19: Secondary | ICD-10-CM | POA: Diagnosis present

## 2020-12-12 DIAGNOSIS — I724 Aneurysm of artery of lower extremity: Secondary | ICD-10-CM | POA: Diagnosis present

## 2020-12-12 DIAGNOSIS — I422 Other hypertrophic cardiomyopathy: Secondary | ICD-10-CM | POA: Diagnosis present

## 2020-12-12 DIAGNOSIS — Z8719 Personal history of other diseases of the digestive system: Secondary | ICD-10-CM | POA: Diagnosis not present

## 2020-12-12 DIAGNOSIS — E669 Obesity, unspecified: Secondary | ICD-10-CM | POA: Diagnosis present

## 2020-12-12 DIAGNOSIS — I2510 Atherosclerotic heart disease of native coronary artery without angina pectoris: Secondary | ICD-10-CM | POA: Diagnosis present

## 2020-12-12 DIAGNOSIS — K31811 Angiodysplasia of stomach and duodenum with bleeding: Secondary | ICD-10-CM | POA: Diagnosis present

## 2020-12-12 DIAGNOSIS — D62 Acute posthemorrhagic anemia: Secondary | ICD-10-CM | POA: Diagnosis present

## 2020-12-12 DIAGNOSIS — Z79899 Other long term (current) drug therapy: Secondary | ICD-10-CM | POA: Diagnosis not present

## 2020-12-12 DIAGNOSIS — K3182 Dieulafoy lesion (hemorrhagic) of stomach and duodenum: Secondary | ICD-10-CM | POA: Diagnosis present

## 2020-12-12 DIAGNOSIS — I472 Ventricular tachycardia: Secondary | ICD-10-CM | POA: Diagnosis not present

## 2020-12-12 DIAGNOSIS — K5900 Constipation, unspecified: Secondary | ICD-10-CM | POA: Diagnosis present

## 2020-12-12 DIAGNOSIS — I340 Nonrheumatic mitral (valve) insufficiency: Secondary | ICD-10-CM | POA: Diagnosis not present

## 2020-12-12 DIAGNOSIS — Z87891 Personal history of nicotine dependence: Secondary | ICD-10-CM | POA: Diagnosis not present

## 2020-12-12 DIAGNOSIS — D649 Anemia, unspecified: Secondary | ICD-10-CM | POA: Diagnosis present

## 2020-12-12 DIAGNOSIS — E785 Hyperlipidemia, unspecified: Secondary | ICD-10-CM

## 2020-12-12 DIAGNOSIS — K219 Gastro-esophageal reflux disease without esophagitis: Secondary | ICD-10-CM | POA: Diagnosis present

## 2020-12-12 LAB — TYPE AND SCREEN
ABO/RH(D): O POS
ABO/RH(D): O POS
Antibody Screen: NEGATIVE
Antibody Screen: NEGATIVE
Unit division: 0
Unit division: 0
Unit division: 0
Unit division: 0

## 2020-12-12 LAB — CBC
HCT: 25.6 % — ABNORMAL LOW (ref 36.0–46.0)
Hemoglobin: 8.3 g/dL — ABNORMAL LOW (ref 12.0–15.0)
MCH: 30.7 pg (ref 26.0–34.0)
MCHC: 32.4 g/dL (ref 30.0–36.0)
MCV: 94.8 fL (ref 80.0–100.0)
Platelets: 203 10*3/uL (ref 150–400)
RBC: 2.7 MIL/uL — ABNORMAL LOW (ref 3.87–5.11)
RDW: 15.8 % — ABNORMAL HIGH (ref 11.5–15.5)
WBC: 6.1 10*3/uL (ref 4.0–10.5)
nRBC: 0 % (ref 0.0–0.2)

## 2020-12-12 LAB — BPAM RBC
Blood Product Expiration Date: 202210122359
Blood Product Expiration Date: 202210212359
Blood Product Expiration Date: 202210242359
Blood Product Expiration Date: 202210242359
ISSUE DATE / TIME: 202209181319
ISSUE DATE / TIME: 202209181658
ISSUE DATE / TIME: 202209190937
ISSUE DATE / TIME: 202209191234
Unit Type and Rh: 5100
Unit Type and Rh: 5100
Unit Type and Rh: 5100
Unit Type and Rh: 9500

## 2020-12-12 LAB — HEMOGLOBIN AND HEMATOCRIT, BLOOD
HCT: 26.1 % — ABNORMAL LOW (ref 36.0–46.0)
Hemoglobin: 8.5 g/dL — ABNORMAL LOW (ref 12.0–15.0)

## 2020-12-12 LAB — GLUCOSE, CAPILLARY: Glucose-Capillary: 83 mg/dL (ref 70–99)

## 2020-12-12 MED ORDER — MAGNESIUM SULFATE 50 % IJ SOLN
1.0000 g | Freq: Once | INTRAMUSCULAR | Status: AC
  Administered 2020-12-12: 1 g via INTRAVENOUS
  Filled 2020-12-12: qty 2

## 2020-12-12 MED ORDER — CHLORHEXIDINE GLUCONATE CLOTH 2 % EX PADS: 6.0000 | MEDICATED_PAD | Freq: Every day | CUTANEOUS | Status: DC

## 2020-12-12 MED ORDER — PHENOL 1.4 % MT LIQD
1.0000 | OROMUCOSAL | Status: DC | PRN
  Administered 2020-12-12: 1 via OROMUCOSAL
  Filled 2020-12-12: qty 177

## 2020-12-12 MED ORDER — CHLORHEXIDINE GLUCONATE CLOTH 2 % EX PADS
6.0000 | MEDICATED_PAD | Freq: Every day | CUTANEOUS | Status: DC
  Administered 2020-12-12 – 2020-12-15 (×3): 6 via TOPICAL

## 2020-12-12 NOTE — Plan of Care (Signed)

## 2020-12-12 NOTE — Progress Notes (Signed)
Brief Progress Note:   75 y.o. year old female with IDA, requiring blood transfusions, followed by Hematology with IV iron, and undergoing extensive GI evaluation since August 2020 as per HPI with AVMs and recently with suspected duodenal dieulafoy lesion s/p IR embolization on 9/13, discharged on 9/15 with hemoglobin of 7.6. She presented again 9/18 with generalized weakness, shortness of breath, and melena, found to have hemoglobin 5.6.   EGD 12/12/20 with normal stomach, blood in the duodenal bulb and second portion of the duodenum, pulsating vessel with active bleeding consistent with dieulafoy lesion in third-fourth portion of the duodenum s/p injection with epinephrine, unsuccessful, 3 hemostatic clips were placed which slowed the bleeding, but also unsuccessful, finally hemospray was used which stopped the active bleeding.  Case was discussed with interventional radiology with plans to transfer to [facility] for IR embolization. Patient is waiting on a bed to come available at [facility] for transfer.   She has received a total of 4 unit PRBCs this admission.  Hemoglobin improved to 8.7 yesterday, down slightly today to 8.3.  Last bowel movement yesterday was black after her procedure which is to be expected.  No BM thus far today. Denies abdominal pain, nausea, vomiting. Primary concern today is throat pain/soreness following procedure yesterday.   Recommendations:  Transfer to [facility] for IR embolization as planned once bed becomes available. Keep NPO Continue to monitor H&H and transfuse as necessary. Continue IV PPI twice daily. Chloraseptic throat spray as needed Continue supportive measures.   Aliene Altes, PA-C [facility] 12/12/2020

## 2020-12-12 NOTE — Progress Notes (Signed)
Report called to [place] , Therapist, sports at [facility] 2[facility].

## 2020-12-12 NOTE — H&P (Signed)
NAME:  Stacey Reyes, MRN:  [id number], DOB:  [birth year], LOS: 0 ADMISSION DATE:  12/10/2020, CONSULTATION DATE:  12/12/20 REFERRING MD:  Dr. Dyann Kief, CHIEF COMPLAINT:  GIB   History of Present Illness:   75 year old female with prior history of hypertrophic cardiomyopathy, HTN, HLD, IDA, previous GI bleeds and small bowel AVMs, diverticulitis, and chronic cystitis who presented to [facility] on 9/18 with ongoing dark stools, generalized weakness, some exertional shortness of breath, and became weak on 9/18.   Patient with extensive prior GI workup since 2020.  Recently hospitalized in July for similar presentation with unrevealing EGD and capsul endoscopy which revealed small bowel AVMs.  Additionally, hospitalized 9/12- 9/15 for acute on chronic blood loss anemia requiring multiple blood transfusions.  EGD showed blood in the second portion of the duodenum with a dieulafoy suspected.  Required IR embolization.  Since discharge, her stools have been consistently dark tarry.    Admitted to [facility] with GI consulting.  Hgb 5.6 since transfused 4 units PRBCs with improvement in Hgb to 8.3 . CTA was unrevealing of source of bleed.  Underwent EGD on 9/19 which showed normal stomach, blood in duodenal bulb and second portion of the duodeum with pulsating vessel with active bleed c/w dieulafoy lesion s/p epi and 3 hemostatic clips which slowed bleeding but finally required hemospray.  Case was discussed with interventional radiology and patient transferred to [facility] for IR embolization, PCCM accepting.     Pertinent  Medical History  HTN, hypertrophic cardiomyopathy, HLD, IDA, diverticulitis, chronic cystitis, previous GIB and small bowel AVMs  Significant Hospital Events: Including procedures, antibiotic start and stop dates in addition to other pertinent events   9/18 admitted [facility] TRH, GIB, ABLA 9/19 EGD  9/20 transferred to [facility]  Interim History / Subjective:  Patient has been hemodynamically stable and  currently on room air.  Denies any current SOB, nausea, or weakness.  Has some intermittent abdominal pains during transport- pt does not locate to specific location. wi Reports one small black tarry stool today.  Has been NPO.   Objective   Blood pressure 139/68, pulse 79, temperature 97.9 F (36.6 C), temperature source Oral, resp. rate 17, height 5\' 5"  (1.651 m), weight 83.3 kg, SpO2 98 %.        Intake/Output Summary (Last 24 hours) at 12/12/2020 1316 Last data filed at 12/11/2020 1600 Gross per 24 hour  Intake 4.99 ml  Output --  Net 4.99 ml   Filed Weights   12/10/20 1700  Weight: 83.3 kg   Examination: General:  Adult female lying in bed in NAD HEENT: MM pink/dry  Neuro: Aox3, MAE CV: NSR, +murmur PULM:  non labored, CTA, on room air GI: soft, ND, mild tenderness in RUQ Extremities: warm/dry, no LE edema  Skin: no rashes  Resolved Hospital Problem list    Assessment & Plan:   Acute GIB secondary to duodenal dieulafoy lesion Underwent EGD 9/19- normal stomach, blood in duodenal bulb and second portion of the duodeum with pulsating vessel with active bleed c/w dieulafoy lesion s/p epi and 3 hemostatic clips which slowed bleeding but finally required hemospray S/p 4 units PRBC at [facility] P:  - paged IR.  Given hemodynamic stability unclear if IR will proceed tonight vs tomorrow - NPO for now  - trend H/H q 12; transfuse for Hgb < 7 - send for type and screen  - continue PPI BID - hold on GI consult.    ABLA secondary to GIB as  above Hx IDA Trend CBC, transfuse as above   HLD HTN Continue crestor Hold home bisoprolol for now   Hypertrophic cardiomyopathy - strict I/Os/ daily weights - compensated currently.  At risk for volume overload with multiple blood products but currently euvolemic   Best Practice (right click and "Reselect all SmartList Selections" daily)   Diet/type: NPO for now pending procedure DVT prophylaxis: SCD GI prophylaxis: PPI Lines:  N/A Foley:  N/A Code Status:  full code Last date of multidisciplinary goals of care discussion [pt updated on plan of care.  States NOK is her son, Gershon Mussel [phone]  Labs   CBC: Recent Labs  Lab 12/10/20 1120 12/10/20 2040 12/11/20 0627 12/11/20 1206 12/11/20 1407 12/11/20 2200 12/12/20 0814  WBC 8.7  --  5.9  --  8.7 8.1 6.1  NEUTROABS 6.0  --   --   --   --   --   --   HGB 5.6*   < > 6.2* 7.1* 8.7* 8.5* 8.3*  HCT 18.0*   < > 19.5* 21.0* 27.1* 25.8* 25.6*  MCV 99.4  --  96.1  --  94.4 93.5 94.8  PLT 238  --  184  --  187 184 203   < > = values in this interval not displayed.    Basic Metabolic Panel: Recent Labs  Lab 12/06/20 0438 12/10/20 1120 12/11/20 0627 12/11/20 1206  NA 137 136 137 138  K 3.5 3.9 3.9 4.1  CL 106 106 108 107  CO2 26 25 27   --   GLUCOSE 91 119* 103* 100*  BUN 19 20 17 16   CREATININE 0.62 0.61 0.62 0.50  CALCIUM 8.2* 7.9* 7.8*  --   MG 1.8  --   --   --    GFR: Estimated Creatinine Clearance: 64.7 mL/min (by C-G formula based on SCr of 0.5 mg/dL). Recent Labs  Lab 12/11/20 0627 12/11/20 1407 12/11/20 2200 12/12/20 0814  WBC 5.9 8.7 8.1 6.1    Liver Function Tests: Recent Labs  Lab 12/10/20 1120  AST 20  ALT 14  ALKPHOS 22*  BILITOT 0.6  PROT 5.6*  ALBUMIN 3.1*   No results for input(s): LIPASE, AMYLASE in the last 168 hours. No results for input(s): AMMONIA in the last 168 hours.  ABG    Component Value Date/Time   TCO2 25 12/11/2020 1206     Coagulation Profile: Recent Labs  Lab 12/10/20 1120  INR 1.1    Cardiac Enzymes: No results for input(s): CKTOTAL, CKMB, CKMBINDEX, TROPONINI in the last 168 hours.  HbA1C: Hemoglobin A1C  Date/Time Value Ref Range Status  02/24/2015 02:03 PM 5.6  Final    Comment:    normal range 4.8-5.6%    CBG: No results for input(s): GLUCAP in the last 168 hours.  Review of Systems:   Review of Systems  Constitutional:  Positive for malaise/fatigue. Negative for chills and  fever.  Respiratory:  Negative for cough, sputum production and shortness of breath.        Exertional SOB, since resolved  Cardiovascular:  Negative for chest pain and orthopnea.  Gastrointestinal:  Positive for abdominal pain. Negative for diarrhea, melena, nausea and vomiting.       Positive for dark tarry stools  Neurological:  Negative for dizziness, focal weakness, loss of consciousness and weakness.   Past Medical History:  She,  has a past medical history of Anemia, Blood transfusion without reported diagnosis, Cataract, Chronic cystitis, Diverticulitis, Heart murmur, Hyperlipidemia, Hypertension, and Personal  history of colonic polyps-adenomas (01/07/2012).   Surgical History:   Past Surgical History:  Procedure Laterality Date   BREAST BIOPSY Right    No Scar seen    COLONOSCOPY  multiple   ESOPHAGOGASTRODUODENOSCOPY (EGD) WITH PROPOFOL N/A 11/23/2019   Procedure: ESOPHAGOGASTRODUODENOSCOPY (EGD) WITH PROPOFOL;  Surgeon: Daneil Dolin, MD;  [facility];  Service: Endoscopy;  Laterality: N/A;   ESOPHAGOGASTRODUODENOSCOPY (EGD) WITH PROPOFOL N/A 10/20/2020   Procedure: ESOPHAGOGASTRODUODENOSCOPY (EGD) WITH PROPOFOL;  Surgeon: Eloise Harman, DO;  [facility];  Service: Endoscopy;  Laterality: N/A;   ESOPHAGOGASTRODUODENOSCOPY (EGD) WITH PROPOFOL N/A 12/05/2020   Procedure: ESOPHAGOGASTRODUODENOSCOPY (EGD) WITH PROPOFOL;  Surgeon: Rogene Houston, MD;  [facility];  Service: Endoscopy;  Laterality: N/A;  with enteroscopy   GIVENS CAPSULE STUDY N/A 11/23/2019   Procedure: GIVENS CAPSULE STUDY;  Surgeon: Daneil Dolin, MD;  [facility];  Service: Endoscopy;  Laterality: N/A;   GIVENS CAPSULE STUDY N/A 10/22/2020   Procedure: GIVENS CAPSULE STUDY;  Surgeon: Eloise Harman, DO;  [facility];  Service: Endoscopy;  Laterality: N/A;   IR ANGIOGRAM SELECTIVE EACH ADDITIONAL VESSEL  12/05/2020   IR ANGIOGRAM SELECTIVE EACH  ADDITIONAL VESSEL  12/05/2020   IR ANGIOGRAM SELECTIVE EACH ADDITIONAL VESSEL  12/05/2020   IR ANGIOGRAM VISCERAL SELECTIVE  12/05/2020   IR EMBO ART  VEN HEMORR LYMPH EXTRAV  INC GUIDE ROADMAPPING  12/05/2020   IR US GUIDE VASC ACCESS RIGHT  12/05/2020     Social History:   reports that she quit smoking about 35 years ago. Her smoking use included cigarettes. She has never used smokeless tobacco. She reports current alcohol use. She reports that she does not use drugs.   Family History:  Her family history includes Asthma in her brother; Breast cancer in her sister; Colon cancer (age of onset: 84) in her mother. There is no history of Colon polyps, Esophageal cancer, Rectal cancer, or Stomach cancer.   Allergies Allergies  Allergen Reactions   Sulfa Antibiotics Rash     Home Medications  Prior to Admission medications   Medication Sig Start Date End Date Taking? Authorizing Provider  Ascorbic Acid (VITAMIN C) 1000 MG tablet Take 1,000 mg by mouth daily.   Yes [provider]  bisoprolol (ZEBETA) 5 MG tablet Take 0.5 tablets (2.5 mg total) by mouth daily. 10/27/20  Yes Lendon Colonel, NP  clobetasol ointment (TEMOVATE) 5.45 % Apply 1 application topically 2 (two) times daily as needed (skin). 10/26/20  Yes [provider]  Copper Gluconate (COPPER CAPS PO) Take 1 capsule by mouth daily.    Yes [provider]  cyanocobalamin ([employer] VITAMIN B12) 2000 MCG tablet Take 1 tablet (2,000 mcg total) by mouth daily. 03/25/19  Yes Johnson, Clanford L, MD  Magnesium 200 MG TABS Take 1 tablet (200 mg total) by mouth daily. 03/23/19  Yes Lendon Colonel, NP  Multiple Minerals-Vitamins (CALCIUM-MAGNESIUM-ZINC-D3 PO) Take 1 tablet by mouth daily.   Yes [provider]  Multiple Vitamin (MULTIVITAMIN) capsule Take 1 capsule by mouth daily.    Yes [provider]  pantoprazole (PROTONIX) 40 MG tablet 1 po BID x 8 weeks, then 1 po daily Patient taking  differently: Take 40 mg by mouth See admin instructions. Starting 9.15.22, take 1 tablet by mouth twice daily for 8 weeks, then 1 tablet by mouth once daily. 12/07/20  Yes Johnson, Clanford L, MD  rosuvastatin (CRESTOR) 10 MG tablet TAKE  1 TABLET BY MOUTH AT  BEDTIME Patient taking differently: Take 10 mg by mouth daily. 04/12/20  Yes Ronnie Doss M, DO  vitamin E 400 UNIT capsule Take 400 Units by mouth daily.    Yes [provider]     Critical care time: n/a     Kennieth Rad, ACNP [facility] 12/12/2020, 5:27 PM  See Amion for pager If no response to pager, please call PCCM consult pager After 7:00 pm call Elink

## 2020-12-12 NOTE — Progress Notes (Signed)
PROGRESS NOTE    Stacey Reyes  HYW:[id number] DOB: 04-26-1945 DOA: 12/10/2020 PCP: [name], DO   Chief Complaint  Patient presents with   Shortness of Breath    Brief admission narrative:  As Per H&P written by Dr. Denton Brick on 12/10/2020 Stacey Reyes is a 75 y.o. female with medical history significant for  GI bleed, HTN, hypertrophic cardiomyopathy. Patient presented to the ED with c/o generalized weakness and difficulty breathing with activity, and dizziness when standing.  She reports her extremities feeling like jelly when she exerts herself.    Recently hospitalized 9/12- 9/15 for Acute on chronic blood loss anemia likely due to bleeding Dieulafoy lesion or diverticulum. Transfused with 4u of blood. EGD- showed Blood in the second portion of the duodenum; Dieulafoy suspected. Use of duodenoscope did not help in localizing bleeding site, hence she was transferred to [facility] angiogram with embolization by IR.   Patient reports since she has been discharged, all of her bowel movements daily have looked like blood.  She has not seen any blood in her stool.  No vomiting , no abdominal pain.  Takes Tylenol only for pain, denies NSAID use.   ED Course: Blood pressure systolic 48-546 O2 sats percent on room air.  Hemoglobin 5.6.  Chest x-ray without acute abnormality.  He needs PRBC ordered for transfusion. EDP consulted gastroenterologist Dr. Abbey Chatters, CTA was recommended, which did not reveal source of bleed. Hospitalist to admit  Assessment & Plan: 1-Acute blood loss anemia in the setting of upper GI bleed. -Status post endoscopic evaluation demonstrating active GI bleed with oozing vessel; per GI status post Hemospray and clipping.  Case discussed with interventional radiology who is recommending embolization.  Patient will be transferred to [facility] (ICU floor) for further evaluation and management by IR. -Status post 4 units of  PRBCs; hemoglobin was 8.3 currently. -Patient may end up requiring further transfusion to maintain hemoglobin above 7. -Continue IV PPI and n.p.o. status for now.  2-hyperlipidemia -Continue Crestor. -Heart healthy diet encouraged.  3-hypertension -Continue holding bisoprolol in the setting of acute bleeding. -continue to follow vital signs. -Blood pressure has remained stable.  4-hypertrophic cardiomyopathy -Continue to follow daily weights and strict I's and O's. -Encourage to pursued low-sodium diet once advanced. -Continue blood pressure control. -Currently compensated. -Patient denies shortness of breath, orthopnea or dyspnea on exertion.  5-class I obesity -Body mass index is 30.56 kg/m. -Low calorie diet, portion control and increase physical activity discussed with patient.   DVT prophylaxis: SCDs Code Status: Full code Family Communication: No family at bedside. Disposition:   Status is: Inpatient status  Dispo: The patient is from: Home              Anticipated d/c is to: Home              Patient currently is not medically stable to d/c.   Difficult to place patient No       Consultants:  Gastroenterology service  Procedures:  Endoscopy Embolization by IR (hopefully 12/12/20)  Antimicrobials:  None   Subjective: Patient expressed feeling weak and tired; no overt bleeding.  No chest pain, no abdominal pain, no nausea, no vomiting, no shortness of breath.  Expressed to be hungry and would like something to eat.  Objective: Vitals:   12/11/20 1413 12/11/20 1421 12/11/20 2126 12/12/20 0413  BP: 130/75 (!) 152/57 (!) 141/83 136/72  Pulse: 77 76 85 80  Resp: 20  20 18 19   Temp: 98.2 F (36.8 C) 98.3 F (36.8 C) 98.4 F (36.9 C) 98.7 F (37.1 C)  TempSrc: Oral Oral Oral   SpO2: 97% 96% 98% 95%  Weight:      Height:        Intake/Output Summary (Last 24 hours) at 12/12/2020 1121 Last data filed at 12/11/2020 1600 Gross per 24 hour  Intake  689.99 ml  Output --  Net 689.99 ml   Filed Weights   12/10/20 1700  Weight: 83.3 kg    Examination: General exam: Alert, awake, oriented x 3; no chest pain, no palpitations, no shortness of breath, no nausea or vomiting.  Reports feeling hungry and expressed no overt bleeding overnight. Respiratory system: Clear to auscultation. Respiratory effort normal.  No requiring oxygen supplementation. Cardiovascular system:RRR. No murmurs, rubs, gallops.  No JVD. Gastrointestinal system: Abdomen is nondistended, soft and nontender. No organomegaly or masses felt. Normal bowel sounds heard. Central nervous system: Alert and oriented. No focal neurological deficits. Extremities: No cyanosis or clubbing. Skin: No petechiae. Psychiatry: Judgement and insight appear normal. Mood & affect appropriate.   Data Reviewed: I have personally reviewed following labs and imaging studies  CBC: Recent Labs  Lab 12/10/20 1120 12/10/20 2040 12/11/20 0627 12/11/20 1206 12/11/20 1407 12/11/20 2200 12/12/20 0814  WBC 8.7  --  5.9  --  8.7 8.1 6.1  NEUTROABS 6.0  --   --   --   --   --   --   HGB 5.6*   < > 6.2* 7.1* 8.7* 8.5* 8.3*  HCT 18.0*   < > 19.5* 21.0* 27.1* 25.8* 25.6*  MCV 99.4  --  96.1  --  94.4 93.5 94.8  PLT 238  --  184  --  187 184 203   < > = values in this interval not displayed.    Basic Metabolic Panel: Recent Labs  Lab 12/06/20 0438 12/10/20 1120 12/11/20 0627 12/11/20 1206  NA 137 136 137 138  K 3.5 3.9 3.9 4.1  CL 106 106 108 107  CO2 26 25 27   --   GLUCOSE 91 119* 103* 100*  BUN 19 20 17 16   CREATININE 0.62 0.61 0.62 0.50  CALCIUM 8.2* 7.9* 7.8*  --   MG 1.8  --   --   --     GFR: Estimated Creatinine Clearance: 64.7 mL/min (by C-G formula based on SCr of 0.5 mg/dL).  Liver Function Tests: Recent Labs  Lab 12/10/20 1120  AST 20  ALT 14  ALKPHOS 22*  BILITOT 0.6  PROT 5.6*  ALBUMIN 3.1*    CBG: No results for input(s): GLUCAP in the last 168  hours.   Recent Results (from the past 240 hour(s))  SARS CORONAVIRUS 2 (TAT 6-24 HRS) Nasopharyngeal Nasopharyngeal Swab     Status: None   Collection Time: 12/04/20  2:30 PM   Specimen: Nasopharyngeal Swab  Result Value Ref Range Status   SARS Coronavirus 2 NEGATIVE NEGATIVE Final    Comment: (NOTE) SARS-CoV-2 target nucleic acids are NOT DETECTED.  The SARS-CoV-2 RNA is generally detectable in upper and lower respiratory specimens during the acute phase of infection. Negative results do not preclude SARS-CoV-2 infection, do not rule out co-infections with other pathogens, and should not be used as the sole basis for treatment or other patient management decisions. Negative results must be combined with clinical observations, patient history, and epidemiological information. The expected result is Negative.  Fact Sheet for Patients: SugarRoll.be  Fact Sheet for Healthcare Providers: https://www.woods-mathews.com/  This test is not yet approved or cleared by the [place] FDA and  has been authorized for detection and/or diagnosis of SARS-CoV-2 by FDA under an Emergency Use Authorization (EUA). This EUA will remain  in effect (meaning this test can be used) for the duration of the COVID-19 declaration under Se ction 564(b)(1) of the Act, 21 U.S.C. section 360bbb-3(b)(1), unless the authorization is terminated or revoked sooner.  Performed at [facility], [place] [address] [place], [place] [address]   MRSA Next Gen by PCR, Nasal     Status: None   Collection Time: 12/05/20  8:37 PM   Specimen: Nasal Mucosa; Nasal Swab  Result Value Ref Range Status   MRSA by PCR Next Gen NOT DETECTED NOT DETECTED Final    Comment: (NOTE) The GeneXpert MRSA Assay (FDA approved for NASAL specimens only), is one component of a comprehensive MRSA colonization surveillance program. It is not intended to diagnose MRSA infection nor to guide or  monitor treatment for MRSA infections. Test performance is not FDA approved in patients less than 39 years old. Performed at [facility], [address], [place], [place] [address]      Radiology Studies: [facility] 1 View  Result Date: 12/10/2020 CLINICAL DATA:  Shortness of breath and weakness over the last 4 days EXAM: PORTABLE CHEST 1 VIEW COMPARISON:  12/04/2020 FINDINGS: Stable mild to moderate enlargement of the cardiopericardial silhouette, without edema. Atherosclerotic calcification of the aortic arch. Thoracic spondylosis. No blunting of the costophrenic angles. Mild degenerative glenohumeral arthropathy bilaterally. IMPRESSION: 1. Stable mild to moderate enlargement of the cardiopericardial silhouette, without edema. 2.  Aortic Atherosclerosis (ICD10-I70.0). 3. Thoracic spondylosis. Electronically Signed   By: Van Clines M.D.   On: 12/10/2020 12:06   CT Angio Abd/Pel W and/or Wo Contrast  Result Date: 12/10/2020 CLINICAL DATA:  GI bleed, weakness EXAM: CTA ABDOMEN AND PELVIS WITHOUT AND WITH CONTRAST TECHNIQUE: Multidetector CT imaging of the abdomen and pelvis was performed using the standard protocol during bolus administration of intravenous contrast. Multiplanar reconstructed images and MIPs were obtained and reviewed to evaluate the vascular anatomy. CONTRAST:  117mL OMNIPAQUE IOHEXOL 350 MG/ML SOLN COMPARISON:  CT abdomen pelvis, 11/14/2019, interventional angiogram, 12/05/2020 FINDINGS: VASCULAR Normal contour and caliber of the abdominal aorta. No evidence of aortic aneurysm, dissection, or other acute aortic pathology. Moderate mixed calcific atherosclerosis. Standard branching pattern of the abdominal aorta, with solitary bilateral renal arteries. The branch vessel origins are patent. Coil material within the pancreaticoduodenal and gastroduodenal arteries. 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 Review of the MIP images confirms the above findings. NON-VASCULAR Lower chest: No acute abnormality.  Coronary artery calcifications. Hepatobiliary: No solid liver abnormality is seen. No gallstones, gallbladder wall thickening, or biliary dilatation. Pancreas: Unremarkable. No pancreatic ductal dilatation or surrounding inflammatory changes. Spleen: Normal in size without significant abnormality. Adrenals/Urinary Tract: Adrenal glands are unremarkable. Kidneys are normal, without renal calculi, solid lesion, or hydronephrosis. Bladder is unremarkable. Stomach/Bowel: Stomach is within normal limits. Appendix appears normal. No evidence of bowel wall thickening, distention, or inflammatory changes. Lymphatic: No enlarged abdominal or pelvic lymph nodes. Reproductive: No mass or other significant abnormality. Other: No abdominal wall hernia or abnormality. No abdominopelvic ascites. Musculoskeletal: No acute or significant osseous findings. IMPRESSION: 1. Normal contour and caliber of the abdominal aorta. No  evidence of aortic aneurysm, dissection, or other acute aortic pathology. Moderate mixed calcific atherosclerosis. 2. 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 3. No contrast extravasation within the bowel lumen. 4. Coil material within the pancreaticoduodenal and gastroduodenal arteries, per recent coiling procedure. 5. Coronary artery disease. Aortic Atherosclerosis (ICD10-I70.0). Electronically Signed   By: Eddie Candle M.D.   On: 12/10/2020 13:25    Scheduled Meds:  sodium chloride   Intravenous Once   magnesium sulfate  1 g Intravenous Once   melatonin  6 mg Oral QHS   pantoprazole (PROTONIX) IV  40 mg Intravenous Q12H   rosuvastatin  10 mg Oral q1800   Continuous Infusions:   LOS: 0 days    Time spent: 35 minutes    Barton Dubois,  MD [facility]   To contact the attending provider between 7A-7P or the covering provider during after hours 7P-7A, please log into the web site [email or web address] and access using universal [place] password for that web site. If you do not have the password, please call the hospital operator.  12/12/2020, 11:21 AM

## 2020-12-12 NOTE — Progress Notes (Signed)
Patient being transported via EMS at this time to [facility] for IR procedure via carelink.

## 2020-12-12 NOTE — Telephone Encounter (Signed)
I was going to attempt another TCM call, but records indicate she was admitted again 12/10/20 and cancelled her last TCM appt. Will call patient when she is d/c this time

## 2020-12-13 ENCOUNTER — Inpatient Hospital Stay (HOSPITAL_COMMUNITY): Payer: Medicare Other

## 2020-12-13 ENCOUNTER — Encounter (HOSPITAL_COMMUNITY): Payer: Self-pay | Admitting: Family Medicine

## 2020-12-13 DIAGNOSIS — D62 Acute posthemorrhagic anemia: Secondary | ICD-10-CM | POA: Diagnosis not present

## 2020-12-13 HISTORY — PX: IR ANGIOGRAM VISCERAL SELECTIVE: IMG657

## 2020-12-13 HISTORY — PX: IR EMBO ART  VEN HEMORR LYMPH EXTRAV  INC GUIDE ROADMAPPING: IMG5450

## 2020-12-13 HISTORY — PX: IR ANGIOGRAM SELECTIVE EACH ADDITIONAL VESSEL: IMG667

## 2020-12-13 HISTORY — PX: IR US GUIDE VASC ACCESS LEFT: IMG2389

## 2020-12-13 LAB — MAGNESIUM: Magnesium: 2 mg/dL (ref 1.7–2.4)

## 2020-12-13 LAB — BASIC METABOLIC PANEL
Anion gap: 6 (ref 5–15)
BUN: 11 mg/dL (ref 8–23)
CO2: 23 mmol/L (ref 22–32)
Calcium: 8 mg/dL — ABNORMAL LOW (ref 8.9–10.3)
Chloride: 107 mmol/L (ref 98–111)
Creatinine, Ser: 0.7 mg/dL (ref 0.44–1.00)
GFR, Estimated: >60 mL/min (ref >60)
Glucose, Bld: 99 mg/dL (ref 70–99)
Potassium: 3.4 mmol/L — ABNORMAL LOW (ref 3.5–5.1)
Sodium: 136 mmol/L (ref 135–145)

## 2020-12-13 LAB — CBC
HCT: 24.7 % — ABNORMAL LOW (ref 36.0–46.0)
Hemoglobin: 7.8 g/dL — ABNORMAL LOW (ref 12.0–15.0)
MCH: 29.7 pg (ref 26.0–34.0)
MCHC: 31.6 g/dL (ref 30.0–36.0)
MCV: 93.9 fL (ref 80.0–100.0)
Platelets: 210 10*3/uL (ref 150–400)
RBC: 2.63 MIL/uL — ABNORMAL LOW (ref 3.87–5.11)
RDW: 15.7 % — ABNORMAL HIGH (ref 11.5–15.5)
WBC: 5.9 10*3/uL (ref 4.0–10.5)
nRBC: 0 % (ref 0.0–0.2)

## 2020-12-13 MED ORDER — IOHEXOL 240 MG/ML SOLN
50.0000 mL | Freq: Once | INTRAMUSCULAR | Status: AC | PRN
  Administered 2020-12-13: 41 mL via INTRAVENOUS

## 2020-12-13 MED ORDER — LIDOCAINE HCL 1 % IJ SOLN
INTRAMUSCULAR | Status: AC
  Filled 2020-12-13: qty 20

## 2020-12-13 MED ORDER — FENTANYL CITRATE (PF) 100 MCG/2ML IJ SOLN
INTRAMUSCULAR | Status: AC
  Filled 2020-12-13: qty 2

## 2020-12-13 MED ORDER — FENTANYL CITRATE (PF) 100 MCG/2ML IJ SOLN
INTRAMUSCULAR | Status: DC | PRN
  Administered 2020-12-13 (×3): 25 ug via INTRAVENOUS

## 2020-12-13 MED ORDER — GELATIN ABSORBABLE 12-7 MM EX MISC
CUTANEOUS | Status: AC
  Filled 2020-12-13: qty 1

## 2020-12-13 MED ORDER — LIDOCAINE HCL (PF) 1 % IJ SOLN
INTRAMUSCULAR | Status: DC | PRN
  Administered 2020-12-13: 8 mL

## 2020-12-13 MED ORDER — MIDAZOLAM HCL 2 MG/2ML IJ SOLN
INTRAMUSCULAR | Status: AC
  Filled 2020-12-13: qty 2

## 2020-12-13 MED ORDER — POTASSIUM CHLORIDE 10 MEQ/100ML IV SOLN
10.0000 meq | INTRAVENOUS | Status: AC
  Administered 2020-12-13 (×3): 10 meq via INTRAVENOUS
  Filled 2020-12-13 (×4): qty 100

## 2020-12-13 MED ORDER — LIDOCAINE HCL (PF) 1 % IJ SOLN
INTRAMUSCULAR | Status: AC
  Filled 2020-12-13: qty 30

## 2020-12-13 MED ORDER — THROMBIN FOR PERCUTANEOUS TREATMENT OF PSEUDOANEURYSM (5000UNITS/10ML)
Freq: Once | PERCUTANEOUS | Status: AC
  Administered 2020-12-13: 500 [IU] via PERCUTANEOUS
  Filled 2020-12-13: qty 1

## 2020-12-13 MED ORDER — MIDAZOLAM HCL 2 MG/2ML IJ SOLN
INTRAMUSCULAR | Status: DC | PRN
  Administered 2020-12-13: .5 mg via INTRAVENOUS
  Administered 2020-12-13: 1 mg via INTRAVENOUS
  Administered 2020-12-13: .5 mg via INTRAVENOUS

## 2020-12-13 NOTE — Consult Note (Signed)
Chief Complaint: Patient was seen in consultation today for mesenteric arteriogram with possible embolization and possible right femoral artery pseudoaneurysm thrombin injection Chief Complaint  Patient presents with   Shortness of Breath   at the request of Dr Tyler Aas; Dr Reino Bellis   Supervising Physician: Corrie Mckusick  Patient Status: [name] - In-pt  History of Present Illness: Stacey Reyes is a 75 y.o. 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 She presented again 9/18 with generalized weakness, shortness of breath, and melena, found to have hemoglobin 5.6.    IR 12/05/20: IMPRESSION: Technically successful prophylactic percutaneous coil embolization of the GDA and the pancreaticoduodenal artery for endoscopically confirmed acute upper GI/duodenal bleeding.   CT 9/18:  IMPRESSION: 1. Normal contour and caliber of the abdominal aorta. No evidence of aortic aneurysm, dissection, or other acute aortic pathology. Moderate mixed calcific atherosclerosis. 2. 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 3. No contrast extravasation within the bowel lumen. 4. Coil material within the pancreaticoduodenal and gastroduodenal arteries, per recent coiling procedure. 5. Coronary artery disease.  EGD 9/19: - Normal stomach. - Blood in the duodenal bulb and in the second portion of the duodenum. - One bleeding angiodysplastic lesion in the duodenum. Treatment not successful. Clips   Request made to return to IR for Mesenteric arteriogram with possible embolization  And Possible right femoral artery pseudoaneurysm thrombin  injection  Pt has had no noted blood from rectum She does confirm tarry stools even now  Past Medical History:  Diagnosis Date   Anemia    Blood transfusion without reported diagnosis    Cataract    removed bilateral   Chronic cystitis    Diverticulitis    Heart murmur    Hyperlipidemia    Hypertension    Personal history of colonic polyps-adenomas 01/07/2012   2009 - 2 diminutive adenomas (prior polyps also) 01/07/2012 - 2 diminutive adenomas      Past Surgical History:  Procedure Laterality Date   BREAST BIOPSY Right    No Scar seen    COLONOSCOPY  multiple   ESOPHAGOGASTRODUODENOSCOPY (EGD) WITH PROPOFOL N/A 11/23/2019   Procedure: ESOPHAGOGASTRODUODENOSCOPY (EGD) WITH PROPOFOL;  Surgeon: Daneil Dolin, MD;  [facility];  Service: Endoscopy;  Laterality: N/A;   ESOPHAGOGASTRODUODENOSCOPY (EGD) WITH PROPOFOL N/A 10/20/2020   Procedure: ESOPHAGOGASTRODUODENOSCOPY (EGD) WITH PROPOFOL;  Surgeon: Eloise Harman, DO;  [facility]: [facility];  Service: Endoscopy;  Laterality: N/A;   ESOPHAGOGASTRODUODENOSCOPY (EGD) WITH PROPOFOL N/A 12/05/2020   Procedure: ESOPHAGOGASTRODUODENOSCOPY (EGD) WITH PROPOFOL;  Surgeon: Rogene Houston, MD;  [facility]: [facility];  Service: Endoscopy;  Laterality: N/A;  with enteroscopy   ESOPHAGOGASTRODUODENOSCOPY (EGD) WITH PROPOFOL N/A 12/11/2020   Procedure: ESOPHAGOGASTRODUODENOSCOPY (EGD) WITH PROPOFOL;  Surgeon: Eloise Harman, DO;  [facility]: [facility];  Service: Endoscopy;  Laterality: N/A;   GIVENS CAPSULE STUDY N/A 11/23/2019   Procedure: GIVENS CAPSULE STUDY;  Surgeon: Daneil Dolin, MD;  [facility]: [facility];  Service: Endoscopy;  Laterality: N/A;   GIVENS CAPSULE STUDY N/A 10/22/2020   Procedure: GIVENS CAPSULE STUDY;  Surgeon: Eloise Harman, DO;  [facility]: [facility];  Service: Endoscopy;  Laterality: N/A;   HEMOSTASIS CLIP PLACEMENT  12/11/2020   Procedure: HEMOSTASIS CLIP PLACEMENT;  Surgeon: Eloise Harman, DO;  [facility];  Service: Endoscopy;;   IR ANGIOGRAM SELECTIVE EACH ADDITIONAL VESSEL  12/05/2020   IR ANGIOGRAM SELECTIVE EACH ADDITIONAL VESSEL  12/05/2020   IR ANGIOGRAM SELECTIVE EACH ADDITIONAL VESSEL  12/05/2020   IR ANGIOGRAM VISCERAL SELECTIVE  12/05/2020   IR EMBO ART  VEN HEMORR LYMPH EXTRAV  INC GUIDE ROADMAPPING  12/05/2020   IR US GUIDE VASC ACCESS RIGHT  12/05/2020    Allergies: Sulfa antibiotics  Medications: Prior to Admission medications   Medication Sig Start Date End Date Taking? Authorizing Provider  Ascorbic Acid (VITAMIN C) 1000 MG tablet Take 1,000 mg by mouth daily.   Yes [provider]  bisoprolol (ZEBETA) 5 MG tablet Take 0.5 tablets (2.5 mg total) by mouth daily. 10/27/20  Yes Lendon Colonel, NP  clobetasol ointment (TEMOVATE) 8.75 % Apply 1 application topically 2 (two) times daily as needed (skin). 10/26/20  Yes [provider]  Copper Gluconate (COPPER CAPS PO) Take 1 capsule by mouth daily.    Yes [provider]  cyanocobalamin ([employer] VITAMIN B12) 2000 MCG tablet Take 1 tablet (2,000 mcg total) by mouth daily. 03/25/19  Yes Johnson, Clanford L, MD  Magnesium 200 MG TABS Take 1 tablet (200 mg total) by mouth daily. 03/23/19  Yes Lendon Colonel, NP  Multiple Minerals-Vitamins (CALCIUM-MAGNESIUM-ZINC-D3 PO) Take 1 tablet by mouth daily.   Yes [provider]  Multiple Vitamin (MULTIVITAMIN) capsule Take 1 capsule by mouth daily.    Yes [provider]  pantoprazole (PROTONIX) 40 MG tablet 1 po BID x 8 weeks, then 1 po daily Patient taking differently: Take 40 mg by mouth See admin instructions. Starting 9.15.22, take 1 tablet by mouth twice daily for 8 weeks, then 1 tablet by mouth once daily. 12/07/20  Yes Johnson, Clanford L, MD  rosuvastatin (CRESTOR) 10 MG tablet TAKE 1 TABLET BY MOUTH AT  BEDTIME Patient taking differently: Take 10 mg by mouth daily. 04/12/20  Yes Ronnie Doss M, DO  vitamin E  400 UNIT capsule Take 400 Units by mouth daily.    Yes [provider]     Family History  Problem Relation Age of Onset   Colon cancer Mother 83       80's   Breast cancer Sister    Asthma Brother    Colon polyps Neg Hx    Esophageal cancer Neg Hx    Rectal cancer Neg Hx    Stomach cancer Neg Hx     Social History   Socioeconomic History   Marital status: Married    Spouse name: Not on file   Number of children: 1   Years of education: Not on file   Highest education level: Not on file  Occupational History   Occupation: retired  Tobacco Use   Smoking status: Former    Types: Cigarettes    Quit date: 12/23/1984    Years since quitting: 35.9   Smokeless tobacco: Never  Vaping Use   Vaping Use: Never used  Substance and Sexual Activity   Alcohol use: Yes    Comment: rare   Drug use: No   Sexual activity: Not on file  Other Topics Concern   Not on file  Social History Narrative   Patient is married and retired and has 1 grown child   Social Determinants of Health  Financial Resource Strain: Not on file  Food Insecurity: Not on file  Transportation Needs: Not on file  Physical Activity: Not on file  Stress: Not on file  Social Connections: Not on file    Review of Systems: A 12 point ROS discussed and pertinent positives are indicated in the HPI above.  All other systems are negative.    Vital Signs: BP (!) 111/49   Pulse 72   Temp 98.2 F (36.8 C) (Oral)   Resp 13   Ht 5' 5"  (1.651 m)   Wt 191 lb 9.3 oz (86.9 kg)   SpO2 93%   BMI 31.88 kg/m   Physical Exam Vitals reviewed.  HENT:     Mouth/Throat:     Mouth: Mucous membranes are moist.  Cardiovascular:     Rate and Rhythm: Normal rate and regular rhythm.     Heart sounds: Murmur heard.  Pulmonary:     Effort: Pulmonary effort is normal.     Breath sounds: Normal breath sounds. No wheezing.  Abdominal:     Palpations: Abdomen is soft.     Tenderness: There is no abdominal  tenderness.  Musculoskeletal:        General: Normal range of motion.  Skin:    General: Skin is warm.  Neurological:     Mental Status: She is alert and oriented to person, place, and time.  Psychiatric:        Behavior: Behavior normal.    Imaging: IR Angiogram Visceral Selective  Result Date: 12/06/2020 INDICATION: History of chronic intermittent GI bleeding with endoscopy performed earlier today demonstrating acute bleeding at the level of the duodenum. As such, patient presents for mesenteric arteriogram and embolization. EXAM: 1. ULTRASOUND GUIDANCE FOR ARTERIAL ACCESS 2. SELECTIVE CELIAC ARTERIOGRAM 3. SUB SELECTIVE COMMON HEPATIC ARTERIOGRAM 4. SUB SELECTIVE PANCREATICODUODENAL ARTERIOGRAM AND PERCUTANEOUS COIL EMBOLIZATION 5. SUB SELECTIVE GASTRODUODENAL ARTERIOGRAM AND PERCUTANEOUS COIL EMBOLIZATION COMPARISON:  CT abdomen and pelvis-11/14/2019 MEDICATIONS: None ANESTHESIA/SEDATION: Moderate (conscious) sedation was employed during this procedure. A total of Versed 2 mg and Fentanyl 100 mcg was administered intravenously. Moderate Sedation Time: 65 minutes. The patient's level of consciousness and vital signs were monitored continuously by radiology nursing throughout the procedure under my direct supervision. CONTRAST:  70 cc Omnipaque 350 FLUOROSCOPY TIME:  20 minutes, 30 seconds (3,614 mGy) COMPLICATIONS: None immediate. PROCEDURE: Informed consent was obtained from the patient following explanation of the procedure, risks, benefits and alternatives. All questions were addressed. A time out was performed prior to the initiation of the procedure. Maximal barrier sterile technique utilized including caps, mask, sterile gowns, sterile gloves, large sterile drape, hand hygiene, and Betadine prep. The right femoral head was marked fluoroscopically. Under sterile conditions and local anesthesia, the right common femoral artery access was performed with a micropuncture needle. Under direct  ultrasound guidance, the right common femoral was accessed with a micropuncture kit. An ultrasound image was saved for documentation purposes. This allowed for placement of a 5-French vascular sheath. A limited arteriogram was performed through the side arm of the sheath confirming appropriate access within the right common femoral artery. Over a Bentson wire, a Mickelson catheter was advanced the caudal aspect of the thoracic aorta where was reformed, back bled and flushed. The Mickelson catheter was then utilized to select the celiac artery selective celiac arteriogram was performed. Next, with the use of a fathom 16 microwire, a regular Renegade microcatheter was utilized to select the common hepatic artery and a selective common hepatic arteriogram was performed.  The microcatheter was then utilized to select a hypertrophied pancreaticoduodenal artery arising from the proximal aspect of the GDA and a selective pancreaticoduodenal arteriogram was performed. The microcatheter was advanced to its mid/caudal aspect of the pancreaticoduodenal artery and vessel was subsequently percutaneously coil embolized with multiple overlapping 2 mm, 3 mm and 4 mm soft and fibered interlock coils to near the vessel's origin. The microcatheter was then retracted to the level of the gastroduodenal artery and a selective gastroduodenal arteriogram was performed. The microcatheter was advanced to the distal aspect of the GDA, at the takeoff of the right gastroepiploic artery. Next, the GDA was percutaneously coil embolized with multiple overlapping 5 mm, 6 mm, 8 mm and 10 mm diameter soft and fibered interlock coils. The microcatheter was retracted to the level of the common hepatic artery and post embolization common hepatic arteriograms were performed. Images were reviewed and the procedure was terminated. All wires, catheters and sheaths were removed from the patient. Hemostasis was achieved at the right groin access site with  deployment of an ExoSeal closure device and manual compression. The patient tolerated the procedure well without immediate post procedural complication. FINDINGS: Selective celiac arteriogram demonstrates conventional takeoff of the GDA however note is made of a prominent pancreaticoduodenal artery which arises from the proximal GDA. No discrete areas contrast extravasation or vessel irregularity are identified angiographically. Given endoscopic confirmation duodenal bleeding, the decision was made to proceed with prophylactic embolization. Selective pancreaticoduodenal arteriogram confirms the vessel contribute multiple distal tributaries to the descending portion of the duodenum and as such the vessel was successfully percutaneously coil embolized with multiple overlapping coils. Selective gastroduodenal arteriogram demonstrates conventional branching pattern, terminating in a right gastroepiploic artery. The gastroduodenal artery was then successfully percutaneously embolized with multiple overlapping coils to near the origin of the GDA. Completion common hepatic arteriogram demonstrates a technically excellent result with no flow into either the GDA or pancreaticoduodenal arteries. IMPRESSION: Technically successful prophylactic percutaneous coil embolization of the GDA and the pancreaticoduodenal artery for endoscopically confirmed acute upper GI/duodenal bleeding. PLAN: - The patient is to remain flat for 4 hours with right leg straight. - The patient will continue to experience several additional bloody bowel movements and may continue to require additional resuscitation (as she was bleeding both before the procedure and the endoscopy unit), however ultimately I am hopeful she will stabilize in the coming days muted rest of resuscitation - Repeat endoscopy may be performed at the discretion of the GI service as indicated. Electronically Signed   By: Sandi Mariscal M.D.   On: 12/06/2020 13:28   IR Angiogram  Selective Each Additional Vessel  Result Date: 12/06/2020 INDICATION: History of chronic intermittent GI bleeding with endoscopy performed earlier today demonstrating acute bleeding at the level of the duodenum. As such, patient presents for mesenteric arteriogram and embolization. EXAM: 1. ULTRASOUND GUIDANCE FOR ARTERIAL ACCESS 2. SELECTIVE CELIAC ARTERIOGRAM 3. SUB SELECTIVE COMMON HEPATIC ARTERIOGRAM 4. SUB SELECTIVE PANCREATICODUODENAL ARTERIOGRAM AND PERCUTANEOUS COIL EMBOLIZATION 5. SUB SELECTIVE GASTRODUODENAL ARTERIOGRAM AND PERCUTANEOUS COIL EMBOLIZATION COMPARISON:  CT abdomen and pelvis-11/14/2019 MEDICATIONS: None ANESTHESIA/SEDATION: Moderate (conscious) sedation was employed during this procedure. A total of Versed 2 mg and Fentanyl 100 mcg was administered intravenously. Moderate Sedation Time: 65 minutes. The patient's level of consciousness and vital signs were monitored continuously by radiology nursing throughout the procedure under my direct supervision. CONTRAST:  70 cc Omnipaque 350 FLUOROSCOPY TIME:  20 minutes, 30 seconds (2,671 mGy) COMPLICATIONS: None immediate. PROCEDURE: Informed consent was obtained from the  patient following explanation of the procedure, risks, benefits and alternatives. All questions were addressed. A time out was performed prior to the initiation of the procedure. Maximal barrier sterile technique utilized including caps, mask, sterile gowns, sterile gloves, large sterile drape, hand hygiene, and Betadine prep. The right femoral head was marked fluoroscopically. Under sterile conditions and local anesthesia, the right common femoral artery access was performed with a micropuncture needle. Under direct ultrasound guidance, the right common femoral was accessed with a micropuncture kit. An ultrasound image was saved for documentation purposes. This allowed for placement of a 5-French vascular sheath. A limited arteriogram was performed through the side arm of the  sheath confirming appropriate access within the right common femoral artery. Over a Bentson wire, a Mickelson catheter was advanced the caudal aspect of the thoracic aorta where was reformed, back bled and flushed. The Mickelson catheter was then utilized to select the celiac artery selective celiac arteriogram was performed. Next, with the use of a fathom 16 microwire, a regular Renegade microcatheter was utilized to select the common hepatic artery and a selective common hepatic arteriogram was performed. The microcatheter was then utilized to select a hypertrophied pancreaticoduodenal artery arising from the proximal aspect of the GDA and a selective pancreaticoduodenal arteriogram was performed. The microcatheter was advanced to its mid/caudal aspect of the pancreaticoduodenal artery and vessel was subsequently percutaneously coil embolized with multiple overlapping 2 mm, 3 mm and 4 mm soft and fibered interlock coils to near the vessel's origin. The microcatheter was then retracted to the level of the gastroduodenal artery and a selective gastroduodenal arteriogram was performed. The microcatheter was advanced to the distal aspect of the GDA, at the takeoff of the right gastroepiploic artery. Next, the GDA was percutaneously coil embolized with multiple overlapping 5 mm, 6 mm, 8 mm and 10 mm diameter soft and fibered interlock coils. The microcatheter was retracted to the level of the common hepatic artery and post embolization common hepatic arteriograms were performed. Images were reviewed and the procedure was terminated. All wires, catheters and sheaths were removed from the patient. Hemostasis was achieved at the right groin access site with deployment of an ExoSeal closure device and manual compression. The patient tolerated the procedure well without immediate post procedural complication. FINDINGS: Selective celiac arteriogram demonstrates conventional takeoff of the GDA however note is made of a  prominent pancreaticoduodenal artery which arises from the proximal GDA. No discrete areas contrast extravasation or vessel irregularity are identified angiographically. Given endoscopic confirmation duodenal bleeding, the decision was made to proceed with prophylactic embolization. Selective pancreaticoduodenal arteriogram confirms the vessel contribute multiple distal tributaries to the descending portion of the duodenum and as such the vessel was successfully percutaneously coil embolized with multiple overlapping coils. Selective gastroduodenal arteriogram demonstrates conventional branching pattern, terminating in a right gastroepiploic artery. The gastroduodenal artery was then successfully percutaneously embolized with multiple overlapping coils to near the origin of the GDA. Completion common hepatic arteriogram demonstrates a technically excellent result with no flow into either the GDA or pancreaticoduodenal arteries. IMPRESSION: Technically successful prophylactic percutaneous coil embolization of the GDA and the pancreaticoduodenal artery for endoscopically confirmed acute upper GI/duodenal bleeding. PLAN: - The patient is to remain flat for 4 hours with right leg straight. - The patient will continue to experience several additional bloody bowel movements and may continue to require additional resuscitation (as she was bleeding both before the procedure and the endoscopy unit), however ultimately I am hopeful she will stabilize in the coming days  muted rest of resuscitation - Repeat endoscopy may be performed at the discretion of the GI service as indicated. Electronically Signed   By: Sandi Mariscal M.D.   On: 12/06/2020 13:28   IR Angiogram Selective Each Additional Vessel  Result Date: 12/06/2020 INDICATION: History of chronic intermittent GI bleeding with endoscopy performed earlier today demonstrating acute bleeding at the level of the duodenum. As such, patient presents for mesenteric arteriogram  and embolization. EXAM: 1. ULTRASOUND GUIDANCE FOR ARTERIAL ACCESS 2. SELECTIVE CELIAC ARTERIOGRAM 3. SUB SELECTIVE COMMON HEPATIC ARTERIOGRAM 4. SUB SELECTIVE PANCREATICODUODENAL ARTERIOGRAM AND PERCUTANEOUS COIL EMBOLIZATION 5. SUB SELECTIVE GASTRODUODENAL ARTERIOGRAM AND PERCUTANEOUS COIL EMBOLIZATION COMPARISON:  CT abdomen and pelvis-11/14/2019 MEDICATIONS: None ANESTHESIA/SEDATION: Moderate (conscious) sedation was employed during this procedure. A total of Versed 2 mg and Fentanyl 100 mcg was administered intravenously. Moderate Sedation Time: 65 minutes. The patient's level of consciousness and vital signs were monitored continuously by radiology nursing throughout the procedure under my direct supervision. CONTRAST:  70 cc Omnipaque 350 FLUOROSCOPY TIME:  20 minutes, 30 seconds (5,374 mGy) COMPLICATIONS: None immediate. PROCEDURE: Informed consent was obtained from the patient following explanation of the procedure, risks, benefits and alternatives. All questions were addressed. A time out was performed prior to the initiation of the procedure. Maximal barrier sterile technique utilized including caps, mask, sterile gowns, sterile gloves, large sterile drape, hand hygiene, and Betadine prep. The right femoral head was marked fluoroscopically. Under sterile conditions and local anesthesia, the right common femoral artery access was performed with a micropuncture needle. Under direct ultrasound guidance, the right common femoral was accessed with a micropuncture kit. An ultrasound image was saved for documentation purposes. This allowed for placement of a 5-French vascular sheath. A limited arteriogram was performed through the side arm of the sheath confirming appropriate access within the right common femoral artery. Over a Bentson wire, a Mickelson catheter was advanced the caudal aspect of the thoracic aorta where was reformed, back bled and flushed. The Mickelson catheter was then utilized to select the  celiac artery selective celiac arteriogram was performed. Next, with the use of a fathom 16 microwire, a regular Renegade microcatheter was utilized to select the common hepatic artery and a selective common hepatic arteriogram was performed. The microcatheter was then utilized to select a hypertrophied pancreaticoduodenal artery arising from the proximal aspect of the GDA and a selective pancreaticoduodenal arteriogram was performed. The microcatheter was advanced to its mid/caudal aspect of the pancreaticoduodenal artery and vessel was subsequently percutaneously coil embolized with multiple overlapping 2 mm, 3 mm and 4 mm soft and fibered interlock coils to near the vessel's origin. The microcatheter was then retracted to the level of the gastroduodenal artery and a selective gastroduodenal arteriogram was performed. The microcatheter was advanced to the distal aspect of the GDA, at the takeoff of the right gastroepiploic artery. Next, the GDA was percutaneously coil embolized with multiple overlapping 5 mm, 6 mm, 8 mm and 10 mm diameter soft and fibered interlock coils. The microcatheter was retracted to the level of the common hepatic artery and post embolization common hepatic arteriograms were performed. Images were reviewed and the procedure was terminated. All wires, catheters and sheaths were removed from the patient. Hemostasis was achieved at the right groin access site with deployment of an ExoSeal closure device and manual compression. The patient tolerated the procedure well without immediate post procedural complication. FINDINGS: Selective celiac arteriogram demonstrates conventional takeoff of the GDA however note is made of a prominent pancreaticoduodenal artery which  arises from the proximal GDA. No discrete areas contrast extravasation or vessel irregularity are identified angiographically. Given endoscopic confirmation duodenal bleeding, the decision was made to proceed with prophylactic  embolization. Selective pancreaticoduodenal arteriogram confirms the vessel contribute multiple distal tributaries to the descending portion of the duodenum and as such the vessel was successfully percutaneously coil embolized with multiple overlapping coils. Selective gastroduodenal arteriogram demonstrates conventional branching pattern, terminating in a right gastroepiploic artery. The gastroduodenal artery was then successfully percutaneously embolized with multiple overlapping coils to near the origin of the GDA. Completion common hepatic arteriogram demonstrates a technically excellent result with no flow into either the GDA or pancreaticoduodenal arteries. IMPRESSION: Technically successful prophylactic percutaneous coil embolization of the GDA and the pancreaticoduodenal artery for endoscopically confirmed acute upper GI/duodenal bleeding. PLAN: - The patient is to remain flat for 4 hours with right leg straight. - The patient will continue to experience several additional bloody bowel movements and may continue to require additional resuscitation (as she was bleeding both before the procedure and the endoscopy unit), however ultimately I am hopeful she will stabilize in the coming days muted rest of resuscitation - Repeat endoscopy may be performed at the discretion of the GI service as indicated. Electronically Signed   By: Sandi Mariscal M.D.   On: 12/06/2020 13:28   IR Angiogram Selective Each Additional Vessel  Result Date: 12/06/2020 INDICATION: History of chronic intermittent GI bleeding with endoscopy performed earlier today demonstrating acute bleeding at the level of the duodenum. As such, patient presents for mesenteric arteriogram and embolization. EXAM: 1. ULTRASOUND GUIDANCE FOR ARTERIAL ACCESS 2. SELECTIVE CELIAC ARTERIOGRAM 3. SUB SELECTIVE COMMON HEPATIC ARTERIOGRAM 4. SUB SELECTIVE PANCREATICODUODENAL ARTERIOGRAM AND PERCUTANEOUS COIL EMBOLIZATION 5. SUB SELECTIVE GASTRODUODENAL  ARTERIOGRAM AND PERCUTANEOUS COIL EMBOLIZATION COMPARISON:  CT abdomen and pelvis-11/14/2019 MEDICATIONS: None ANESTHESIA/SEDATION: Moderate (conscious) sedation was employed during this procedure. A total of Versed 2 mg and Fentanyl 100 mcg was administered intravenously. Moderate Sedation Time: 65 minutes. The patient's level of consciousness and vital signs were monitored continuously by radiology nursing throughout the procedure under my direct supervision. CONTRAST:  70 cc Omnipaque 350 FLUOROSCOPY TIME:  20 minutes, 30 seconds (8,127 mGy) COMPLICATIONS: None immediate. PROCEDURE: Informed consent was obtained from the patient following explanation of the procedure, risks, benefits and alternatives. All questions were addressed. A time out was performed prior to the initiation of the procedure. Maximal barrier sterile technique utilized including caps, mask, sterile gowns, sterile gloves, large sterile drape, hand hygiene, and Betadine prep. The right femoral head was marked fluoroscopically. Under sterile conditions and local anesthesia, the right common femoral artery access was performed with a micropuncture needle. Under direct ultrasound guidance, the right common femoral was accessed with a micropuncture kit. An ultrasound image was saved for documentation purposes. This allowed for placement of a 5-French vascular sheath. A limited arteriogram was performed through the side arm of the sheath confirming appropriate access within the right common femoral artery. Over a Bentson wire, a Mickelson catheter was advanced the caudal aspect of the thoracic aorta where was reformed, back bled and flushed. The Mickelson catheter was then utilized to select the celiac artery selective celiac arteriogram was performed. Next, with the use of a fathom 16 microwire, a regular Renegade microcatheter was utilized to select the common hepatic artery and a selective common hepatic arteriogram was performed. The  microcatheter was then utilized to select a hypertrophied pancreaticoduodenal artery arising from the proximal aspect of the GDA and a selective pancreaticoduodenal arteriogram  was performed. The microcatheter was advanced to its mid/caudal aspect of the pancreaticoduodenal artery and vessel was subsequently percutaneously coil embolized with multiple overlapping 2 mm, 3 mm and 4 mm soft and fibered interlock coils to near the vessel's origin. The microcatheter was then retracted to the level of the gastroduodenal artery and a selective gastroduodenal arteriogram was performed. The microcatheter was advanced to the distal aspect of the GDA, at the takeoff of the right gastroepiploic artery. Next, the GDA was percutaneously coil embolized with multiple overlapping 5 mm, 6 mm, 8 mm and 10 mm diameter soft and fibered interlock coils. The microcatheter was retracted to the level of the common hepatic artery and post embolization common hepatic arteriograms were performed. Images were reviewed and the procedure was terminated. All wires, catheters and sheaths were removed from the patient. Hemostasis was achieved at the right groin access site with deployment of an ExoSeal closure device and manual compression. The patient tolerated the procedure well without immediate post procedural complication. FINDINGS: Selective celiac arteriogram demonstrates conventional takeoff of the GDA however note is made of a prominent pancreaticoduodenal artery which arises from the proximal GDA. No discrete areas contrast extravasation or vessel irregularity are identified angiographically. Given endoscopic confirmation duodenal bleeding, the decision was made to proceed with prophylactic embolization. Selective pancreaticoduodenal arteriogram confirms the vessel contribute multiple distal tributaries to the descending portion of the duodenum and as such the vessel was successfully percutaneously coil embolized with multiple overlapping  coils. Selective gastroduodenal arteriogram demonstrates conventional branching pattern, terminating in a right gastroepiploic artery. The gastroduodenal artery was then successfully percutaneously embolized with multiple overlapping coils to near the origin of the GDA. Completion common hepatic arteriogram demonstrates a technically excellent result with no flow into either the GDA or pancreaticoduodenal arteries. IMPRESSION: Technically successful prophylactic percutaneous coil embolization of the GDA and the pancreaticoduodenal artery for endoscopically confirmed acute upper GI/duodenal bleeding. PLAN: - The patient is to remain flat for 4 hours with right leg straight. - The patient will continue to experience several additional bloody bowel movements and may continue to require additional resuscitation (as she was bleeding both before the procedure and the endoscopy unit), however ultimately I am hopeful she will stabilize in the coming days muted rest of resuscitation - Repeat endoscopy may be performed at the discretion of the GI service as indicated. Electronically Signed   By: Sandi Mariscal M.D.   On: 12/06/2020 13:28   IR US Guide Vasc Access Right  Result Date: 12/06/2020 INDICATION: History of chronic intermittent GI bleeding with endoscopy performed earlier today demonstrating acute bleeding at the level of the duodenum. As such, patient presents for mesenteric arteriogram and embolization. EXAM: 1. ULTRASOUND GUIDANCE FOR ARTERIAL ACCESS 2. SELECTIVE CELIAC ARTERIOGRAM 3. SUB SELECTIVE COMMON HEPATIC ARTERIOGRAM 4. SUB SELECTIVE PANCREATICODUODENAL ARTERIOGRAM AND PERCUTANEOUS COIL EMBOLIZATION 5. SUB SELECTIVE GASTRODUODENAL ARTERIOGRAM AND PERCUTANEOUS COIL EMBOLIZATION COMPARISON:  CT abdomen and pelvis-11/14/2019 MEDICATIONS: None ANESTHESIA/SEDATION: Moderate (conscious) sedation was employed during this procedure. A total of Versed 2 mg and Fentanyl 100 mcg was administered intravenously.  Moderate Sedation Time: 65 minutes. The patient's level of consciousness and vital signs were monitored continuously by radiology nursing throughout the procedure under my direct supervision. CONTRAST:  70 cc Omnipaque 350 FLUOROSCOPY TIME:  20 minutes, 30 seconds (2,025 mGy) COMPLICATIONS: None immediate. PROCEDURE: Informed consent was obtained from the patient following explanation of the procedure, risks, benefits and alternatives. All questions were addressed. A time out was performed prior to the initiation of  the procedure. Maximal barrier sterile technique utilized including caps, mask, sterile gowns, sterile gloves, large sterile drape, hand hygiene, and Betadine prep. The right femoral head was marked fluoroscopically. Under sterile conditions and local anesthesia, the right common femoral artery access was performed with a micropuncture needle. Under direct ultrasound guidance, the right common femoral was accessed with a micropuncture kit. An ultrasound image was saved for documentation purposes. This allowed for placement of a 5-French vascular sheath. A limited arteriogram was performed through the side arm of the sheath confirming appropriate access within the right common femoral artery. Over a Bentson wire, a Mickelson catheter was advanced the caudal aspect of the thoracic aorta where was reformed, back bled and flushed. The Mickelson catheter was then utilized to select the celiac artery selective celiac arteriogram was performed. Next, with the use of a fathom 16 microwire, a regular Renegade microcatheter was utilized to select the common hepatic artery and a selective common hepatic arteriogram was performed. The microcatheter was then utilized to select a hypertrophied pancreaticoduodenal artery arising from the proximal aspect of the GDA and a selective pancreaticoduodenal arteriogram was performed. The microcatheter was advanced to its mid/caudal aspect of the pancreaticoduodenal artery and  vessel was subsequently percutaneously coil embolized with multiple overlapping 2 mm, 3 mm and 4 mm soft and fibered interlock coils to near the vessel's origin. The microcatheter was then retracted to the level of the gastroduodenal artery and a selective gastroduodenal arteriogram was performed. The microcatheter was advanced to the distal aspect of the GDA, at the takeoff of the right gastroepiploic artery. Next, the GDA was percutaneously coil embolized with multiple overlapping 5 mm, 6 mm, 8 mm and 10 mm diameter soft and fibered interlock coils. The microcatheter was retracted to the level of the common hepatic artery and post embolization common hepatic arteriograms were performed. Images were reviewed and the procedure was terminated. All wires, catheters and sheaths were removed from the patient. Hemostasis was achieved at the right groin access site with deployment of an ExoSeal closure device and manual compression. The patient tolerated the procedure well without immediate post procedural complication. FINDINGS: Selective celiac arteriogram demonstrates conventional takeoff of the GDA however note is made of a prominent pancreaticoduodenal artery which arises from the proximal GDA. No discrete areas contrast extravasation or vessel irregularity are identified angiographically. Given endoscopic confirmation duodenal bleeding, the decision was made to proceed with prophylactic embolization. Selective pancreaticoduodenal arteriogram confirms the vessel contribute multiple distal tributaries to the descending portion of the duodenum and as such the vessel was successfully percutaneously coil embolized with multiple overlapping coils. Selective gastroduodenal arteriogram demonstrates conventional branching pattern, terminating in a right gastroepiploic artery. The gastroduodenal artery was then successfully percutaneously embolized with multiple overlapping coils to near the origin of the GDA. Completion  common hepatic arteriogram demonstrates a technically excellent result with no flow into either the GDA or pancreaticoduodenal arteries. IMPRESSION: Technically successful prophylactic percutaneous coil embolization of the GDA and the pancreaticoduodenal artery for endoscopically confirmed acute upper GI/duodenal bleeding. PLAN: - The patient is to remain flat for 4 hours with right leg straight. - The patient will continue to experience several additional bloody bowel movements and may continue to require additional resuscitation (as she was bleeding both before the procedure and the endoscopy unit), however ultimately I am hopeful she will stabilize in the coming days muted rest of resuscitation - Repeat endoscopy may be performed at the discretion of the GI service as indicated. Electronically Signed   By:  Sandi Mariscal M.D.   On: 12/06/2020 13:28   DG Chest Port 1 View  Result Date: 12/10/2020 CLINICAL DATA:  Shortness of breath and weakness over the last 4 days EXAM: PORTABLE CHEST 1 VIEW COMPARISON:  12/04/2020 FINDINGS: Stable mild to moderate enlargement of the cardiopericardial silhouette, without edema. Atherosclerotic calcification of the aortic arch. Thoracic spondylosis. No blunting of the costophrenic angles. Mild degenerative glenohumeral arthropathy bilaterally. IMPRESSION: 1. Stable mild to moderate enlargement of the cardiopericardial silhouette, without edema. 2.  Aortic Atherosclerosis (ICD10-I70.0). 3. Thoracic spondylosis. Electronically Signed   By: Van Clines M.D.   On: 12/10/2020 12:06   DG Chest Port 1 View  Result Date: 12/04/2020 CLINICAL DATA:  Shortness of breath EXAM: PORTABLE CHEST 1 VIEW COMPARISON:  Chest x-ray dated October 19, 2020 FINDINGS: Unchanged cardiomegaly. Mediastinal contours are within normal limits. Clear lungs. No pleural effusion or pneumothorax. IMPRESSION: No active disease. Electronically Signed   By: Yetta Glassman M.D.   On: 12/04/2020 14:02    ECHOCARDIOGRAM COMPLETE  Result Date: 11/20/2020    ECHOCARDIOGRAM REPORT   Patient Name:   SALOTE WEIDMANN Date of Exam: 11/20/2020 Medical Rec #:  [id number]         Height:       61.0 in Accession #:    [id number]        Weight:       199.0 lb Date of Birth:  October 05, 1945         BSA:          1.885 m Patient Age:    12 years          BP:           123/49 mmHg Patient Gender: F                 HR:           51 bpm. Exam Location:  [facility] Procedure: 2D Echo, Cardiac Doppler, Color Doppler and Intracardiac            Opacification Agent Indications:    I42.1 HOCM  History:        Patient has prior history of Echocardiogram examinations, most                 recent 03/24/2019. Risk Factors:Dyslipidemia and Hypertension.                 HOCM. Murmur.  Sonographer:    Wilford Sports Rodgers-Jones RDCS Referring Phys: [place]  1. Hypertrophic cardiomyopathy. Systolic anterior motion of the mitral valve is noted. Significant intracavitary gradient noted. Peak velocity 5.1 m/s. Peak gradient 104.2 mmHg. Left ventricular ejection fraction, by estimation, is >75%. The left ventricle has hyperdynamic function. The left ventricle has no regional wall motion abnormalities. There is severe concentric left ventricular hypertrophy. Left ventricular diastolic parameters are consistent with Grade I diastolic dysfunction (impaired relaxation). Elevated left ventricular end-diastolic pressure.  2. Right ventricular systolic function is normal. The right ventricular size is normal.  3. Left atrial size was severely dilated.  4. The mitral valve is normal in structure. Mild mitral valve regurgitation. No evidence of mitral stenosis.  5. The aortic valve is calcified. There is mild calcification of the aortic valve. There is mild thickening of the aortic valve. Aortic valve regurgitation is not visualized. Mild aortic valve stenosis. Aortic valve mean gradient measures 14.2 mmHg. Aortic valve Vmax  measures 2.59 m/s.  6. Aortic dilatation noted. There is  mild dilatation of the ascending aorta, measuring 37 mm.  7. The inferior vena cava is normal in size with greater than 50% respiratory variability, suggesting right atrial pressure of 3 mmHg. Conclusion(s)/Recommendation(s): Findings consistent with hypertrophic cardiomyopathy. FINDINGS  Left Ventricle: Hypertrophic cardiomyopathy. Systolic anterior motion of the mitral valve is noted. Significant intracavitary gradient noted. Peak velocity 5.1 m/s. Peak gradient 104.2 mmHg. Left ventricular ejection fraction, by estimation, is >75%. The left ventricle has hyperdynamic function. The left ventricle has no regional wall motion abnormalities. Definity contrast agent was given IV to delineate the left ventricular endocardial borders. The left ventricular internal cavity size was normal in size. There is severe concentric left ventricular hypertrophy. Left ventricular diastolic parameters are consistent with Grade I diastolic dysfunction (impaired relaxation). Elevated left ventricular end-diastolic pressure. Right Ventricle: The right ventricular size is normal. No increase in right ventricular wall thickness. Right ventricular systolic function is normal. Left Atrium: Left atrial size was severely dilated. Right Atrium: Right atrial size was normal in size. Pericardium: Trivial pericardial effusion is present. Mitral Valve: The mitral valve is normal in structure. Mild mitral valve regurgitation. No evidence of mitral valve stenosis. Tricuspid Valve: The tricuspid valve is normal in structure. Tricuspid valve regurgitation is trivial. No evidence of tricuspid stenosis. Aortic Valve: The aortic valve is calcified. There is mild calcification of the aortic valve. There is mild thickening of the aortic valve. Aortic valve regurgitation is not visualized. Mild aortic stenosis is present. Aortic valve mean gradient measures  14.2 mmHg. Aortic valve peak gradient  measures 26.9 mmHg. Pulmonic Valve: The pulmonic valve was normal in structure. Pulmonic valve regurgitation is not visualized. No evidence of pulmonic stenosis. Aorta: Aortic dilatation noted. There is mild dilatation of the ascending aorta, measuring 37 mm. Venous: The inferior vena cava is normal in size with greater than 50% respiratory variability, suggesting right atrial pressure of 3 mmHg. IAS/Shunts: No atrial level shunt detected by color flow Doppler.  LEFT VENTRICLE PLAX 2D LVIDd:         3.60 cm  Diastology LVIDs:         1.30 cm  LV e' medial:    4.03 cm/s LV PW:         2.30 cm  LV E/e' medial:  22.8 LV IVS:        1.90 cm  LV e' lateral:   6.31 cm/s LVOT diam:     2.10 cm  LV E/e' lateral: 14.6 LVOT Area:     3.46 cm  RIGHT VENTRICLE             IVC RV Basal diam:  4.10 cm     IVC diam: 1.30 cm RV S prime:     15.20 cm/s TAPSE (M-mode): 3.7 cm LEFT ATRIUM             Index       RIGHT ATRIUM           Index LA diam:        4.20 cm 2.23 cm/m  RA Area:     11.50 cm LA Vol (A2C):   96.2 ml 51.04 ml/m RA Volume:   27.70 ml  14.70 ml/m LA Vol (A4C):   74.0 ml 39.26 ml/m LA Biplane Vol: 90.4 ml 47.96 ml/m  AORTIC VALVE AV Vmax:      259.26 cm/s AV Vmean:     167.087 cm/s AV VTI:       0.564 m AV Peak Grad: 26.9 mmHg AV  Mean Grad: 14.2 mmHg  AORTA Ao Root diam: 3.50 cm Ao Asc diam:  3.70 cm MITRAL VALVE MV Area (PHT): 2.34 cm     SHUNTS MV Decel Time: 324 msec     Systemic Diam: 2.10 cm MV E velocity: 91.90 cm/s MV A velocity: 121.00 cm/s MV E/A ratio:  0.76 Skeet Latch MD Electronically signed by Skeet Latch MD Signature Date/Time: 11/20/2020/3:23:04 PM    Final    IR EMBO ART  VEN HEMORR LYMPH EXTRAV  INC GUIDE ROADMAPPING  Result Date: 12/06/2020 INDICATION: History of chronic intermittent GI bleeding with endoscopy performed earlier today demonstrating acute bleeding at the level of the duodenum. As such, patient presents for mesenteric arteriogram and embolization. EXAM: 1.  ULTRASOUND GUIDANCE FOR ARTERIAL ACCESS 2. SELECTIVE CELIAC ARTERIOGRAM 3. SUB SELECTIVE COMMON HEPATIC ARTERIOGRAM 4. SUB SELECTIVE PANCREATICODUODENAL ARTERIOGRAM AND PERCUTANEOUS COIL EMBOLIZATION 5. SUB SELECTIVE GASTRODUODENAL ARTERIOGRAM AND PERCUTANEOUS COIL EMBOLIZATION COMPARISON:  CT abdomen and pelvis-11/14/2019 MEDICATIONS: None ANESTHESIA/SEDATION: Moderate (conscious) sedation was employed during this procedure. A total of Versed 2 mg and Fentanyl 100 mcg was administered intravenously. Moderate Sedation Time: 65 minutes. The patient's level of consciousness and vital signs were monitored continuously by radiology nursing throughout the procedure under my direct supervision. CONTRAST:  70 cc Omnipaque 350 FLUOROSCOPY TIME:  20 minutes, 30 seconds (0,998 mGy) COMPLICATIONS: None immediate. PROCEDURE: Informed consent was obtained from the patient following explanation of the procedure, risks, benefits and alternatives. All questions were addressed. A time out was performed prior to the initiation of the procedure. Maximal barrier sterile technique utilized including caps, mask, sterile gowns, sterile gloves, large sterile drape, hand hygiene, and Betadine prep. The right femoral head was marked fluoroscopically. Under sterile conditions and local anesthesia, the right common femoral artery access was performed with a micropuncture needle. Under direct ultrasound guidance, the right common femoral was accessed with a micropuncture kit. An ultrasound image was saved for documentation purposes. This allowed for placement of a 5-French vascular sheath. A limited arteriogram was performed through the side arm of the sheath confirming appropriate access within the right common femoral artery. Over a Bentson wire, a Mickelson catheter was advanced the caudal aspect of the thoracic aorta where was reformed, back bled and flushed. The Mickelson catheter was then utilized to select the celiac artery selective  celiac arteriogram was performed. Next, with the use of a fathom 16 microwire, a regular Renegade microcatheter was utilized to select the common hepatic artery and a selective common hepatic arteriogram was performed. The microcatheter was then utilized to select a hypertrophied pancreaticoduodenal artery arising from the proximal aspect of the GDA and a selective pancreaticoduodenal arteriogram was performed. The microcatheter was advanced to its mid/caudal aspect of the pancreaticoduodenal artery and vessel was subsequently percutaneously coil embolized with multiple overlapping 2 mm, 3 mm and 4 mm soft and fibered interlock coils to near the vessel's origin. The microcatheter was then retracted to the level of the gastroduodenal artery and a selective gastroduodenal arteriogram was performed. The microcatheter was advanced to the distal aspect of the GDA, at the takeoff of the right gastroepiploic artery. Next, the GDA was percutaneously coil embolized with multiple overlapping 5 mm, 6 mm, 8 mm and 10 mm diameter soft and fibered interlock coils. The microcatheter was retracted to the level of the common hepatic artery and post embolization common hepatic arteriograms were performed. Images were reviewed and the procedure was terminated. All wires, catheters and sheaths were removed from the patient. Hemostasis was  achieved at the right groin access site with deployment of an ExoSeal closure device and manual compression. The patient tolerated the procedure well without immediate post procedural complication. FINDINGS: Selective celiac arteriogram demonstrates conventional takeoff of the GDA however note is made of a prominent pancreaticoduodenal artery which arises from the proximal GDA. No discrete areas contrast extravasation or vessel irregularity are identified angiographically. Given endoscopic confirmation duodenal bleeding, the decision was made to proceed with prophylactic embolization. Selective  pancreaticoduodenal arteriogram confirms the vessel contribute multiple distal tributaries to the descending portion of the duodenum and as such the vessel was successfully percutaneously coil embolized with multiple overlapping coils. Selective gastroduodenal arteriogram demonstrates conventional branching pattern, terminating in a right gastroepiploic artery. The gastroduodenal artery was then successfully percutaneously embolized with multiple overlapping coils to near the origin of the GDA. Completion common hepatic arteriogram demonstrates a technically excellent result with no flow into either the GDA or pancreaticoduodenal arteries. IMPRESSION: Technically successful prophylactic percutaneous coil embolization of the GDA and the pancreaticoduodenal artery for endoscopically confirmed acute upper GI/duodenal bleeding. PLAN: - The patient is to remain flat for 4 hours with right leg straight. - The patient will continue to experience several additional bloody bowel movements and may continue to require additional resuscitation (as she was bleeding both before the procedure and the endoscopy unit), however ultimately I am hopeful she will stabilize in the coming days muted rest of resuscitation - Repeat endoscopy may be performed at the discretion of the GI service as indicated. Electronically Signed   By: Sandi Mariscal M.D.   On: 12/06/2020 13:28   CT Angio Abd/Pel W and/or Wo Contrast  Result Date: 12/10/2020 CLINICAL DATA:  GI bleed, weakness EXAM: CTA ABDOMEN AND PELVIS WITHOUT AND WITH CONTRAST TECHNIQUE: Multidetector CT imaging of the abdomen and pelvis was performed using the standard protocol during bolus administration of intravenous contrast. Multiplanar reconstructed images and MIPs were obtained and reviewed to evaluate the vascular anatomy. CONTRAST:  1105m OMNIPAQUE IOHEXOL 350 MG/ML SOLN COMPARISON:  CT abdomen pelvis, 11/14/2019, interventional angiogram, 12/05/2020 FINDINGS: VASCULAR Normal  contour and caliber of the abdominal aorta. No evidence of aortic aneurysm, dissection, or other acute aortic pathology. Moderate mixed calcific atherosclerosis. Standard branching pattern of the abdominal aorta, with solitary bilateral renal arteries. The branch vessel origins are patent. Coil material within the pancreaticoduodenal and gastroduodenal arteries. 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 Review of the MIP images confirms the above findings. NON-VASCULAR Lower chest: No acute abnormality.  Coronary artery calcifications. Hepatobiliary: No solid liver abnormality is seen. No gallstones, gallbladder wall thickening, or biliary dilatation. Pancreas: Unremarkable. No pancreatic ductal dilatation or surrounding inflammatory changes. Spleen: Normal in size without significant abnormality. Adrenals/Urinary Tract: Adrenal glands are unremarkable. Kidneys are normal, without renal calculi, solid lesion, or hydronephrosis. Bladder is unremarkable. Stomach/Bowel: Stomach is within normal limits. Appendix appears normal. No evidence of bowel wall thickening, distention, or inflammatory changes. Lymphatic: No enlarged abdominal or pelvic lymph nodes. Reproductive: No mass or other significant abnormality. Other: No abdominal wall hernia or abnormality. No abdominopelvic ascites. Musculoskeletal: No acute or significant osseous findings. IMPRESSION: 1. Normal contour and caliber of the abdominal aorta. No evidence of aortic aneurysm, dissection, or other acute aortic pathology. Moderate mixed calcific atherosclerosis. 2. 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 3. No contrast extravasation within the bowel lumen. 4. Coil material within the pancreaticoduodenal and gastroduodenal arteries, per  recent coiling procedure. 5. Coronary artery disease. Aortic Atherosclerosis (ICD10-I70.0). Electronically Signed   By: Eddie Candle M.D.   On: 12/10/2020 13:25    Labs:  CBC: Recent Labs    12/11/20 1407 12/11/20 2200 12/12/20 0814 12/12/20 1718 12/13/20 0115  WBC 8.7 8.1 6.1  --  5.9  HGB 8.7* 8.5* 8.3* 8.5* 7.8*  HCT 27.1* 25.8* 25.6* 26.1* 24.7*  PLT 187 184 203  --  210    COAGS: Recent Labs    10/19/20 1919 10/20/20 0439 12/10/20 1120  INR 1.0 1.1 1.1  APTT  --  29  --     BMP: Recent Labs    12/27/19 1354 07/04/20 0910 12/06/20 0438 12/10/20 1120 12/11/20 0627 12/11/20 1206 12/13/20 0115  NA 135   < > 137 136 137 138 136  K 3.7   < > 3.5 3.9 3.9 4.1 3.4*  CL 101   < > 106 106 108 107 107  CO2 25   < > 26 25 27   --  23  GLUCOSE 105*   < > 91 119* 103* 100* 99  BUN 10   < > 19 20 17 16 11   CALCIUM 8.9   < > 8.2* 7.9* 7.8*  --  8.0*  CREATININE 0.61   < > 0.62 0.61 0.62 0.50 0.70  GFRNONAA >60   < > >60 >60 >60  --  >60  GFRAA >60  --   --   --   --   --   --    < > = values in this interval not displayed.    LIVER FUNCTION TESTS: Recent Labs    10/19/20 1919 10/20/20 0439 12/04/20 1223 12/10/20 1120  BILITOT 0.4 0.8 0.5 0.6  AST 35 32 24 20  ALT 33 30 18 14   ALKPHOS 28* 21* 25* 22*  PROT 5.7* 5.0* 5.5* 5.6*  ALBUMIN 3.6 3.1* 3.6 3.1*    TUMOR MARKERS: No results for input(s): AFPTM, CEA, CA199, CHROMGRNA in the last 8760 hours.  Assessment and Plan:  GI bleed- tarry stools IR embolization 12/05/20 Returned to ED 9/18 with weakness; sob and Hgb of 5.6 EGD 9/19 revealing bleeding in duodenum-- clips placed CT on 9/18 also revealed R CFA pseudoaneurysm   Pt is scheduled today for re mesenteric arteriogram with possible embolization and right femoral artery pseudoaneurysm thrombin injection  Risks and benefits of mesenteric arteriogram with embolization and possible Right femoral artery pseudoaneurysm thrombin injection were discussed  with the patient including, but not limited to bleeding, infection, vascular injury or contrast induced renal failure.  This interventional procedure involves the use of X-rays and because of the nature of the planned procedure, it is possible that we will have prolonged use of X-ray fluoroscopy.  Potential radiation risks to you include (but are not limited to) the following: - A slightly elevated risk for cancer  several years later in life. This risk is typically less than 0.5% percent. This risk is low in comparison to the normal incidence of human cancer, which is 33% for women and 50% for men according to the [employer]. - Radiation induced injury can include skin redness, resembling a rash, tissue breakdown / ulcers and hair loss (which can be temporary or permanent).   The likelihood of either of these occurring depends on the difficulty of the procedure and whether  you are sensitive to radiation due to previous procedures, disease, or genetic conditions.   IF your procedure requires a prolonged use of radiation, you will be notified and given written instructions for further action.  It is your responsibility to monitor the irradiated area for the 2 weeks following the procedure and to notify your physician if you are concerned that you have suffered a radiation induced injury.    All of the patient's questions were answered, patient is agreeable to proceed.  Consent signed and in chart.   Thank you for this interesting consult.  I greatly enjoyed meeting JACI DESANTO and look forward to participating in their care.  A copy of this report was sent to the requesting provider on this date.  Electronically Signed: Lavonia Drafts, PA-C 12/13/2020, 7:10 AM   I spent a total of 40 Minutes    in face to face in clinical consultation, greater than 50% of which was counseling/coordinating care for mesenteric arteriogram/embolization and R CFA pseudoaneurysm thrombin  injection

## 2020-12-13 NOTE — Progress Notes (Signed)
[facility] Progress Note Patient Name: Stacey Reyes DOB: 12-Apr-1945 MRN: [id number]   Date of Service  12/13/2020  HPI/Events of Note  pt had 19 beat run of VT, asymptomatic.  Potassium was 3.4 and she is currently getting IV replacement  eICU Interventions  Ordered a mag level     Intervention Category Intermediate Interventions: Arrhythmia - evaluation and management  Tilden Dome 12/13/2020, 5:41 AM

## 2020-12-13 NOTE — Procedures (Signed)
Interventional Radiology Procedure Note  Procedure:   US guided thrombin injection of the right CFA PSA.   Findings:  No flow at completion. 2+ palpable DP & PT right and left pulses  Complications: None Recommendations:  - dry dressing x 24 hours - right hip straight x 6 hrs - repeat VASC duplex tomorrow/24 hrs  Signed,  Dulcy Fanny. Earleen Newport, DO

## 2020-12-13 NOTE — Progress Notes (Signed)
[facility] Progress Note Patient Name: Stacey Reyes DOB: [birth year] MRN: [id number]   Date of Service  12/13/2020  HPI/Events of Note  K 3.4  eICU Interventions  Ordered K riders x 4     Intervention Category Minor Interventions: Electrolytes abnormality - evaluation and management  Tilden Dome 12/13/2020, 3:00 AM

## 2020-12-13 NOTE — Progress Notes (Addendum)
NAME:  Stacey Reyes, MRN:  [id number], DOB:  1945/09/30, LOS: 1 ADMISSION DATE:  12/10/2020, CONSULTATION DATE:  12/12/20 REFERRING MD:  Dr. Dyann Kief, CHIEF COMPLAINT:  GIB   History of Present Illness:   75 year old female with prior history of hypertrophic cardiomyopathy, HTN, HLD, IDA, previous GI bleeds and small bowel AVMs, diverticulitis, and chronic cystitis who presented to [facility] on 9/18 with ongoing dark stools, generalized weakness, some exertional shortness of breath, and became weak on 9/18.   Patient with extensive prior GI workup since 2020.  Recently hospitalized in July for similar presentation with unrevealing EGD and capsul endoscopy which revealed small bowel AVMs.  Additionally, hospitalized 9/12- 9/15 for acute on chronic blood loss anemia requiring multiple blood transfusions.  EGD showed blood in the second portion of the duodenum with a dieulafoy suspected.  Required IR embolization.  Since discharge, her stools have been consistently dark tarry.    Admitted to [facility] with GI consulting.  Hgb 5.6 since transfused 4 units PRBCs with improvement in Hgb to 8.3 . CTA was unrevealing of source of bleed.  Underwent EGD on 9/19 which showed normal stomach, blood in duodenal bulb and second portion of the duodeum with pulsating vessel with active bleed c/w dieulafoy lesion s/p epi and 3 hemostatic clips which slowed bleeding but finally required hemospray.  Case was discussed with interventional radiology and patient transferred to [facility] for IR embolization, PCCM accepting.     Pertinent  Medical History  HTN, hypertrophic cardiomyopathy, HLD, IDA, diverticulitis, chronic cystitis, previous GIB and small bowel AVMs  Significant Hospital Events: Including procedures, antibiotic start and stop dates in addition to other pertinent events   9/18 admitted [facility], GIB, ABLA 9/19 EGD  9/20 transferred to [facility]  Interim History / Subjective:  Reported 19 beat run of Vtach overnight, pt  asymptomatic- on review appears to have rapid irregular rhythm, not vtach, possible MAT vs afib  No further bleeding or BM  Hgb stable Going for embolization with IR this morning  Objective   Blood pressure (!) 111/49, pulse 72, temperature 98.1 F (36.7 C), temperature source Oral, resp. rate 13, height 5\' 5"  (1.651 m), weight 86.9 kg, SpO2 93 %.        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  Going for embolization this morning.  Otherwise, no further evidence of bleeding and Hgb stable.  Can transfer out to PCU today and back to [facility] as of 9/22 Continue PPI BID GI not consulted at [facility].  If no further acute issues, can followup outpatient with [facility]  ABLA secondary to GIB as above Hx IDA Trend CBC Transfuse for Hgb <7   HLD HTN Continue crestor Hold home bisoprolol for now, likely can restart later today or tomorrow    Hypertrophic cardiomyopathy Ectopy  - strict I/Os/ daily weights  (unmeasured urinary occurrences thus far) - compensated currently - TTE noted 11/20/20, EF hyperdynamic > 75, hypertrophic cardiomyopathy, G1DD, normal RV,  mild MR, Mild AS, aortic dilation of ascending aorta - continue telemetry monitoring to look for irregular rhythms  - Replete electrolytes for K >4, Mag > 2, getting KCL replete now  Rhythm strip as below      Best Practice (right click and "Reselect all SmartList Selections" daily)   Diet/type: NPO for now pending procedure 9/21 am, can resume heart healthy post procedure  DVT prophylaxis: SCD only in the setting of GIB/ ABLA GI prophylaxis: PPI Lines: N/A Foley:  N/A Code Status:  full code Last date of multidisciplinary goals of care discussion:  Pt updated on plan of care 9/21.  NOK is her son, Gershon Mussel [phone]  Labs   CBC: Recent Labs  Lab 12/10/20 1120 12/10/20 2040 12/11/20 0627 12/11/20 1206 12/11/20 1407 12/11/20 2200 12/12/20 0814 12/12/20 1718 12/13/20 0115  WBC 8.7  --  5.9  --  8.7 8.1 6.1  --  5.9  NEUTROABS 6.0  --   --   --   --   --   --   --   --   HGB 5.6*   < > 6.2*   < > 8.7* 8.5* 8.3* 8.5* 7.8*  HCT 18.0*   < > 19.5*   < > 27.1* 25.8* 25.6* 26.1* 24.7*  MCV 99.4  --  96.1  --  94.4 93.5 94.8  --  93.9  PLT 238  --  184  --  187 184 203  --  210   < > = values in this interval not displayed.     Basic Metabolic Panel: Recent Labs  Lab 12/10/20 1120 12/11/20 0627 12/11/20 1206 12/13/20 0115  NA 136 137 138 136  K 3.9 3.9 4.1 3.4*  CL 106 108 107 107  CO2 25 27  --  23  GLUCOSE 119* 103* 100* 99  BUN 20 17 16 11   CREATININE 0.61 0.62 0.50 0.70  CALCIUM 7.9* 7.8*  --  8.0*  MG  --   --   --  2.0    GFR: Estimated Creatinine Clearance: 66.2 mL/min (by C-G formula based on SCr of 0.7 mg/dL). Recent Labs  Lab 12/11/20 1407 12/11/20 2200 12/12/20 0814 12/13/20 0115  WBC 8.7 8.1 6.1 5.9     Liver Function Tests: Recent Labs  Lab 12/10/20 1120  AST 20  ALT 14  ALKPHOS 22*  BILITOT 0.6  PROT 5.6*  ALBUMIN 3.1*    No results for input(s): LIPASE, AMYLASE in the last 168 hours. No results for input(s): AMMONIA in the last  168 hours.  ABG    Component Value Date/Time   TCO2 25 12/11/2020 1206      Coagulation Profile: Recent Labs  Lab 12/10/20 1120  INR 1.1     Cardiac Enzymes: No results for input(s): CKTOTAL, CKMB, CKMBINDEX, TROPONINI in the last 168 hours.  HbA1C: Hemoglobin A1C  Date/Time Value Ref Range Status  02/24/2015 02:03 PM 5.6  Final    Comment:    normal range 4.8-5.6%    CBG: Recent Labs  Lab 12/12/20 [place] [address]   Critical care  time: n/a     Kennieth Rad, ACNP [facility] 12/13/2020, 8:02 AM  See Amion for pager If no response to pager, please call PCCM consult pager After 7:00 pm call Elink

## 2020-12-13 NOTE — Sedation Documentation (Signed)
5 Fr Exoseal deployed

## 2020-12-13 NOTE — Procedures (Signed)
Interventional Radiology Procedure Note  Procedure:   US guided access left CFA Mesenteric angiogram Coil embolization of inferior pancreatico-duodenal artery from SMA Exoseal for hemostasis  Complications: None  Findings: No active extrav.  Irregular mucosal blush near the clips from the pancreatico-duodenal artery target prompted the need for embo.   Recommendations:  - Left hip straight x 6 hours - routine wound care - Do not submerge for 7 days - Diet per primary team - Expect some clearance of pre-existing GI hemorrhage for 24-72 hours  Signed,  Dulcy Fanny. Earleen Newport, DO

## 2020-12-13 NOTE — Sedation Documentation (Signed)
Patient transported to [facility] ICU. Yeni RN at the bedside to receive the patient. Groin sites assessed. Site in pink, warm and dry. No drainage noted. Soft to palpation, no hematoma noted. +3 distal pulses intact.

## 2020-12-14 ENCOUNTER — Inpatient Hospital Stay (HOSPITAL_COMMUNITY): Payer: Medicare Other

## 2020-12-14 ENCOUNTER — Encounter (HOSPITAL_COMMUNITY): Payer: Self-pay | Admitting: Radiology

## 2020-12-14 ENCOUNTER — Telehealth: Payer: Self-pay

## 2020-12-14 DIAGNOSIS — I340 Nonrheumatic mitral (valve) insufficiency: Secondary | ICD-10-CM

## 2020-12-14 DIAGNOSIS — I724 Aneurysm of artery of lower extremity: Secondary | ICD-10-CM

## 2020-12-14 LAB — BASIC METABOLIC PANEL
Anion gap: 7 (ref 5–15)
BUN: 9 mg/dL (ref 8–23)
CO2: 24 mmol/L (ref 22–32)
Calcium: 8.2 mg/dL — ABNORMAL LOW (ref 8.9–10.3)
Chloride: 105 mmol/L (ref 98–111)
Creatinine, Ser: 0.72 mg/dL (ref 0.44–1.00)
GFR, Estimated: >60 mL/min (ref >60)
Glucose, Bld: 87 mg/dL (ref 70–99)
Potassium: 3.6 mmol/L (ref 3.5–5.1)
Sodium: 136 mmol/L (ref 135–145)

## 2020-12-14 LAB — CBC
HCT: 23.4 % — ABNORMAL LOW (ref 36.0–46.0)
Hemoglobin: 7.5 g/dL — ABNORMAL LOW (ref 12.0–15.0)
MCH: 29.9 pg (ref 26.0–34.0)
MCHC: 32.1 g/dL (ref 30.0–36.0)
MCV: 93.2 fL (ref 80.0–100.0)
Platelets: 227 10*3/uL (ref 150–400)
RBC: 2.51 MIL/uL — ABNORMAL LOW (ref 3.87–5.11)
RDW: 15.6 % — ABNORMAL HIGH (ref 11.5–15.5)
WBC: 6.3 10*3/uL (ref 4.0–10.5)
nRBC: 0 % (ref 0.0–0.2)

## 2020-12-14 LAB — MAGNESIUM: Magnesium: 2 mg/dL (ref 1.7–2.4)

## 2020-12-14 MED ORDER — BISOPROLOL FUMARATE 5 MG PO TABS
5.0000 mg | ORAL_TABLET | Freq: Every day | ORAL | Status: DC
  Administered 2020-12-14: 5 mg via ORAL
  Filled 2020-12-14 (×2): qty 1

## 2020-12-14 MED ORDER — POTASSIUM CHLORIDE CRYS ER 20 MEQ PO TBCR
40.0000 meq | EXTENDED_RELEASE_TABLET | Freq: Once | ORAL | Status: AC
  Administered 2020-12-14: 40 meq via ORAL
  Filled 2020-12-14: qty 2

## 2020-12-14 MED ORDER — POLYETHYLENE GLYCOL 3350 17 G PO PACK: 17.0000 g | PACK | Freq: Two times a day (BID) | ORAL | Status: DC

## 2020-12-14 MED ORDER — PANTOPRAZOLE SODIUM 40 MG PO TBEC
40.0000 mg | DELAYED_RELEASE_TABLET | Freq: Two times a day (BID) | ORAL | Status: DC
  Administered 2020-12-14 – 2020-12-15 (×2): 40 mg via ORAL
  Filled 2020-12-14 (×2): qty 1

## 2020-12-14 NOTE — Progress Notes (Signed)
Right pseudoaneurysm duplex evaluation completed. Refer to "CV Proc" under chart review to view preliminary results.  12/14/2020 9:33 AM Kelby Aline., MHA, RVT, RDCS, RDMS

## 2020-12-14 NOTE — Progress Notes (Signed)
[facility] ADULT ICU REPLACEMENT PROTOCOL   The patient does apply for the [facility] Adult ICU Electrolyte Replacment Protocol based on the criteria listed below:   1.Exclusion criteria: TCTS patients, ECMO patients and Hypothermia Protocol, and   Dialysis patients 2. Is GFR >/= 30 ml/min? Yes.    Patient's GFR today is >60 3. Is SCr </= 2? No. Patient's SCr is 0.72 mg/dL 4. Did SCr increase >/= 0.5 in 24 hours? No. 5.Pt's weight >40kg  Yes.   6. Abnormal electrolyte(s): K+ 3.6  7. Electrolytes replaced per protocol 8.  Call MD STAT for K+ </= 2.5, Phos </= 1, or Mag </= 1 Physician:  n/a   Stacey Reyes 12/14/2020 3:04 AM

## 2020-12-14 NOTE — Progress Notes (Signed)
Referring Physician(s): Carver,C/Madera,C  Supervising Physician: Sandi Mariscal  Patient Status:  Stacey Reyes - In-pt  Chief Complaint:  Recent GI bleeding  Subjective: Pt sitting up in chair; denies fever, HA, CP, dyspnea, cough, abd/back pain.N/V or any further bleeding   Allergies: Sulfa antibiotics  Medications: Prior to Admission medications   Medication Sig Start Date End Date Taking? Authorizing Provider  Ascorbic Acid (VITAMIN C) 1000 MG tablet Take 1,000 mg by mouth daily.   Yes [provider]  bisoprolol (ZEBETA) 5 MG tablet Take 0.5 tablets (2.5 mg total) by mouth daily. 10/27/20  Yes Lendon Colonel, NP  clobetasol ointment (TEMOVATE) 1.30 % Apply 1 application topically 2 (two) times daily as needed (skin). 10/26/20  Yes [provider]  Copper Gluconate (COPPER CAPS PO) Take 1 capsule by mouth daily.    Yes [provider]  cyanocobalamin ([facility] VITAMIN B12) 2000 MCG tablet Take 1 tablet (2,000 mcg total) by mouth daily. 03/25/19  Yes Johnson, Clanford L, MD  Magnesium 200 MG TABS Take 1 tablet (200 mg total) by mouth daily. 03/23/19  Yes Lendon Colonel, NP  Multiple Minerals-Vitamins (CALCIUM-MAGNESIUM-ZINC-D3 PO) Take 1 tablet by mouth daily.   Yes [provider]  Multiple Vitamin (MULTIVITAMIN) capsule Take 1 capsule by mouth daily.    Yes [provider]  pantoprazole (PROTONIX) 40 MG tablet 1 po BID x 8 weeks, then 1 po daily Patient taking differently: Take 40 mg by mouth See admin instructions. Starting 9.15.22, take 1 tablet by mouth twice daily for 8 weeks, then 1 tablet by mouth once daily. 12/07/20  Yes Johnson, Clanford L, MD  rosuvastatin (CRESTOR) 10 MG tablet TAKE 1 TABLET BY MOUTH AT  BEDTIME Patient taking differently: Take 10 mg by mouth daily. 04/12/20  Yes Ronnie Doss M, DO  vitamin E 400 UNIT capsule Take 400 Units by mouth daily.    Yes [provider]     Vital Signs: BP (!) 142/85    Pulse 85   Temp 98.3 F (36.8 C) (Oral)   Resp 16   Ht 5\' 5"  (1.651 m)   Wt 191 lb 9.3 oz (86.9 kg)   SpO2 96%   BMI 31.88 kg/m   Physical Exam awake/alert; rt/left groin access sites ok, NT, no sig ecchymoses, intact distal pulses  Imaging: IR Angiogram Visceral Selective  Result Date: 12/13/2020 INDICATION: 75 year old female presents for mesenteric angiogram and possible embolization for ongoing upper GI hemorrhage EXAM: ULTRASOUND-GUIDED ACCESS LEFT COMMON FEMORAL ARTERY MESENTERIC ANGIOGRAM COIL EMBOLIZATION OF INFERIOR PANCREATICODUODENAL ARTERIES EXOSEAL FOR HEMOSTASIS MEDICATIONS: None ANESTHESIA/SEDATION: Moderate (conscious) sedation was employed during this procedure. A total of Versed 2.0 mg and Fentanyl 75 mcg was administered intravenously. Moderate Sedation Time: 60 minutes. The patient's level of consciousness and vital signs were monitored continuously by radiology nursing throughout the procedure under my direct supervision. CONTRAST:  71mL OMNIPAQUE IOHEXOL 240 MG/ML SOLN, 18mL OMNIPAQUE IOHEXOL 240 MG/ML SOLN, 8mL OMNIPAQUE IOHEXOL 240 MG/ML SOLN, 77mL OMNIPAQUE IOHEXOL 240 MG/ML SOLN FLUOROSCOPY TIME:  Fluoroscopy Time: 8 minutes 24 seconds (13 20 mGy). COMPLICATIONS: None PROCEDURE: Informed consent was obtained from the patient following explanation of the procedure, risks, benefits and alternatives. The patient understands, agrees and consents for the procedure. All questions were addressed. A time out was performed prior to the initiation of the procedure. Maximal barrier sterile technique utilized including caps, mask, sterile gowns, sterile gloves, large sterile drape, hand hygiene, and Betadine prep. Ultrasound survey of the left inguinal  region was performed with images stored and sent to PACs, confirming patency of the vessel. A micropuncture needle was used access the left common femoral artery under ultrasound. With excellent arterial blood flow returned, and an .018  micro wire was passed through the needle, observed enter the abdominal aorta under fluoroscopy. The needle was removed, and a micropuncture sheath was placed over the wire. The inner dilator and wire were removed, and an 035 Bentson wire was advanced under fluoroscopy into the abdominal aorta. The sheath was removed and a standard 5 [place] vascular sheath was placed. The dilator was removed and the sheath was flushed. C2 cobra catheter was then advanced on the Bentson wire into the abdominal aorta. Cobra catheter was used to select the SMA origin. Angiogram was performed with multiple obliquity and magnification. Glidewire was then used to navigate the base catheter further into the SMA. A high-flow Renegade microcatheter was then advanced on a 16 fathom wire into the targeted arteries for super selective angiogram of the inferior pancreaticoduodenal arteries. Some vaso spasm was created within the targeted arteries, however, angiogram revealed some abnormal mucosal blush without extravasation in the tissues immediately adjacent to the surgical clips. The high-flow Renegade was then removed, and a 105 cm STC microcatheter was then advanced with the 16 fathom wire. With an adequate catheter position in the targeted artery, coil embolization was performed with a combination of 2 mm and 3 mm coils to stasis. Microcatheter and microwire were removed, the base catheter was withdrawn, and a final angiogram was performed of the SMA territory. Cobra catheter was then withdrawn into the aorta, Bentson wire was passed, and the catheter was further advanced to above the celiac origin. Catheter was then used to engage the celiac artery origin and a celiac artery angiogram was performed. All catheters wires were then removed. Exoseal was deployed at the left common femoral artery puncture site. Sterile dressing was placed. Patient tolerated the procedure well and remained hemodynamically stable throughout. No complications were  encountered and no significant blood loss. IMPRESSION: Status post ultrasound-guided access left common femoral artery for mesenteric angiogram and super selective coil embolization of the inferior pancreaticoduodenal arteries, contributing to abnormal mucosal blush at the site of the surgical clips in the duodenum. Exoseal for hemostasis. Signed, Dulcy Fanny. Dellia Nims, RPVI [facility] Radiology Electronically Signed   By: Corrie Mckusick D.O.   On: 12/13/2020 12:53   IR Angiogram Selective Each Additional Vessel  INDICATION: 75 year old female presents for mesenteric angiogram and possible embolization for ongoing upper GI hemorrhage EXAM: ULTRASOUND-GUIDED ACCESS LEFT COMMON FEMORAL ARTERY MESENTERIC ANGIOGRAM COIL EMBOLIZATION OF INFERIOR PANCREATICODUODENAL ARTERIES EXOSEAL FOR HEMOSTASIS MEDICATIONS: None ANESTHESIA/SEDATION: Moderate (conscious) sedation was employed during this procedure. A total of Versed 2.0 mg and Fentanyl 75 mcg was administered intravenously. Moderate Sedation Time: 60 minutes. The patient's level of consciousness and vital signs were monitored continuously by radiology nursing throughout the procedure under my direct supervision. CONTRAST:  45mL OMNIPAQUE IOHEXOL 240 MG/ML SOLN, 107mL OMNIPAQUE IOHEXOL 240 MG/ML SOLN, 78mL OMNIPAQUE IOHEXOL 240 MG/ML SOLN, 55mL OMNIPAQUE IOHEXOL 240 MG/ML SOLN FLUOROSCOPY TIME:  Fluoroscopy Time: 8 minutes 24 seconds (13 20 mGy). COMPLICATIONS: None PROCEDURE: Informed consent was obtained from the patient following explanation of the procedure, risks, benefits and alternatives. The patient understands, agrees and consents for the procedure. All questions were addressed. A time out was performed prior to the initiation of the procedure. Maximal barrier sterile technique utilized including caps, mask, sterile gowns,  sterile gloves, large sterile drape, hand hygiene, and Betadine prep. Ultrasound survey of  the left inguinal region was performed with images stored and sent to PACs, confirming patency of the vessel. A micropuncture needle was used access the left common femoral artery under ultrasound. With excellent arterial blood flow returned, and an .018 micro wire was passed through the needle, observed enter the abdominal aorta under fluoroscopy. The needle was removed, and a micropuncture sheath was placed over the wire. The inner dilator and wire were removed, and an 035 Bentson wire was advanced under fluoroscopy into the abdominal aorta. The sheath was removed and a standard 5 [place] vascular sheath was placed. The dilator was removed and the sheath was flushed. C2 cobra catheter was then advanced on the Bentson wire into the abdominal aorta. Cobra catheter was used to select the SMA origin. Angiogram was performed with multiple obliquity and magnification. Glidewire was then used to navigate the base catheter further into the SMA. A high-flow Renegade microcatheter was then advanced on a 16 fathom wire into the targeted arteries for super selective angiogram of the inferior pancreaticoduodenal arteries. Some vaso spasm was created within the targeted arteries, however, angiogram revealed some abnormal mucosal blush without extravasation in the tissues immediately adjacent to the surgical clips. The high-flow Renegade was then removed, and a 105 cm STC microcatheter was then advanced with the 16 fathom wire. With an adequate catheter position in the targeted artery, coil embolization was performed with a combination of 2 mm and 3 mm coils to stasis. Microcatheter and microwire were removed, the base catheter was withdrawn, and a final angiogram was performed of the SMA territory. Cobra catheter was then withdrawn into the aorta, Bentson wire was passed, and the catheter was further advanced to above the celiac origin. Catheter was then used to engage the celiac artery origin and a celiac artery angiogram was  performed. All catheters wires were then removed. Exoseal was deployed at the left common femoral artery puncture site. Sterile dressing was placed. Patient tolerated the procedure well and remained hemodynamically stable throughout. No complications were encountered and no significant blood loss. IMPRESSION: Status post ultrasound-guided access left common femoral artery for mesenteric angiogram and super selective coil embolization of the inferior pancreaticoduodenal arteries, contributing to abnormal mucosal blush at the site of the surgical clips in the duodenum. Exoseal for hemostasis. Signed, Dulcy Fanny. Dellia Nims, RPVI [facility] Inter[facility] Radiology Special[facility] Radiology Electronically Signed   By: Corrie Mckusick D.O.   On: 12/13/2020 12:53   IR Angiogram Selective Each Additional Vessel  INDICATION: 75 year old female presents for mesenteric angiogram and possible embolization for ongoing upper GI hemorrhage EXAM: ULTRASOUND-GUIDED ACCESS LEFT COMMON FEMORAL ARTERY MESENTERIC ANGIOGRAM COIL EMBOLIZATION OF INFERIOR PANCREATICODUODENAL ARTERIES EXOSEAL FOR HEMOSTASIS MEDICATIONS: None ANESTHESIA/SEDATION: Moderate (conscious) sedation was employed during this procedure. A total of Versed 2.0 mg and Fentanyl 75 mcg was administered intravenously. Moderate Sedation Time: 60 minutes. The patient's level of consciousness and vital signs were monitored continuously by radiology nursing throughout the procedure under my direct supervision. CONTRAST:  63mL OMNIPAQUE IOHEXOL 240 MG/ML SOLN, 47mL OMNIPAQUE IOHEXOL 240 MG/ML SOLN, 29mL OMNIPAQUE IOHEXOL 240 MG/ML SOLN, 79mL OMNIPAQUE IOHEXOL 240 MG/ML SOLN FLUOROSCOPY TIME:  Fluoroscopy Time: 8 minutes 24 seconds (13 20 mGy). COMPLICATIONS: None PROCEDURE: Informed consent was obtained from the patient following explanation of the procedure, risks, benefits and alternatives. The patient understands, agrees and consents for the procedure. All  questions were addressed. A time out was performed  prior to the initiation of the procedure. Maximal barrier sterile technique utilized including caps, mask, sterile gowns, sterile gloves, large sterile drape, hand hygiene, and Betadine prep. Ultrasound survey of the left inguinal region was performed with images stored and sent to PACs, confirming patency of the vessel. A micropuncture needle was used access the left common femoral artery under ultrasound. With excellent arterial blood flow returned, and an .018 micro wire was passed through the needle, observed enter the abdominal aorta under fluoroscopy. The needle was removed, and a micropuncture sheath was placed over the wire. The inner dilator and wire were removed, and an 035 Bentson wire was advanced under fluoroscopy into the abdominal aorta. The sheath was removed and a standard 5 [place] vascular sheath was placed. The dilator was removed and the sheath was flushed. C2 cobra catheter was then advanced on the Bentson wire into the abdominal aorta. Cobra catheter was used to select the SMA origin. Angiogram was performed with multiple obliquity and magnification. Glidewire was then used to navigate the base catheter further into the SMA. A high-flow Renegade microcatheter was then advanced on a 16 fathom wire into the targeted arteries for super selective angiogram of the inferior pancreaticoduodenal arteries. Some vaso spasm was created within the targeted arteries, however, angiogram revealed some abnormal mucosal blush without extravasation in the tissues immediately adjacent to the surgical clips. The high-flow Renegade was then removed, and a 105 cm STC microcatheter was then advanced with the 16 fathom wire. With an adequate catheter position in the targeted artery, coil embolization was performed with a combination of 2 mm and 3 mm coils to stasis. Microcatheter and microwire were removed, the base catheter was withdrawn, and a final angiogram was  performed of the SMA territory. Cobra catheter was then withdrawn into the aorta, Bentson wire was passed, and the catheter was further advanced to above the celiac origin. Catheter was then used to engage the celiac artery origin and a celiac artery angiogram was performed. All catheters wires were then removed. Exoseal was deployed at the left common femoral artery puncture site. Sterile dressing was placed. Patient tolerated the procedure well and remained hemodynamically stable throughout. No complications were encountered and no significant blood loss. IMPRESSION: Status post ultrasound-guided access left common femoral artery for mesenteric angiogram and super selective coil embolization of the inferior pancreaticoduodenal arteries, contributing to abnormal mucosal blush at the site of the surgical clips in the duodenum. Exoseal for hemostasis. Signed, Dulcy Fanny. Dellia Nims, RPVI V[facility] and Interventional Radiology Specialists [facility] Radiology Electronically Signed   By: Corrie Mckusick D.O.   On: 12/13/2020 12:53   [place] Compress [place]  Result Date: 12/13/2020 INDICATION: 75 year old female presents for treatment of right common femoral artery pseudoaneurysm EXAM: ULTRASOUND-GUIDED NEEDLE PLACEMENT THROMBIN INJECTION OF PSEUDOANEURYSM MEDICATIONS: None ANESTHESIA/SEDATION: No moderate sedation. CONTRAST:  None FLUOROSCOPY TIME:  Ultrasound COMPLICATIONS: None PROCEDURE: Informed consent was obtained from the patient following explanation of the procedure, risks, benefits and alternatives. The patient understands, agrees and consents for the procedure. All questions were addressed. A time out was performed prior to the initiation of the procedure. Maximal barrier sterile technique utilized including caps, mask, sterile gowns, sterile gloves, large sterile drape, hand hygiene, and Betadine prep. Ultrasound images were performed of the right inguinal region, confirming persistence pseudoaneurysm  associated with the right common femoral artery. Estimated diameter 17 mm. The patient was then prepped and draped in the usual sterile fashion. 1% lidocaine was used for local anesthesia. Bovine thrombin was  then reconstituted. A 21 gauge needle was advanced under ultrasound guidance through the dome of the pseudoaneurysm into the mid segment. Once we confirmed needle tip position, embolization was then performed under ultrasound visualization. Because of the rapidity with which the small pseudoaneurysm thrombosed, a needle exchange was made for a 20 gauge needle, confirming no return of flow. Color flow confirmed embolization of the entire aneurysm. Needle was removed and a final image was stored. 2+ palpable pulses at the right DP and PT confirmed at the conclusion. Sterile dressing was placed. Patient tolerated the procedure well and remained hemodynamically stable throughout. No complications were encountered and no significant blood loss. IMPRESSION: Status post treatment of right common femoral artery pseudoaneurysm with ultrasound-guided thrombin injection. Signed, Dulcy Fanny. Dellia Nims, RPVI V[facility] and Inter[facility] Radiology Specialists [facility] Radiology Electronically Signed   By: Corrie Mckusick D.O.   On: 12/13/2020 14:40   IR US Guide Vasc Access Left  Result Date: 12/13/2020 INDICATION: 75 year old female presents for mesenteric angiogram and possible embolization for ongoing upper GI hemorrhage EXAM: ULTRASOUND-GUIDED ACCESS LEFT COMMON FEMORAL ARTERY MESENTERIC ANGIOGRAM COIL EMBOLIZATION OF INFERIOR PANCREATICODUODENAL ARTERIES EXOSEAL FOR HEMOSTASIS MEDICATIONS: None ANESTHESIA/SEDATION: Moderate (conscious) sedation was employed during this procedure. A total of Versed 2.0 mg and Fentanyl 75 mcg was administered intravenously. Moderate Sedation Time: 60 minutes. The patient's level of consciousness and vital signs were monitored continuously by radiology nursing throughout the procedure  under my direct supervision. CONTRAST:  54mL OMNIPAQUE IOHEXOL 240 MG/ML SOLN, 64mL OMNIPAQUE IOHEXOL 240 MG/ML SOLN, 42mL OMNIPAQUE IOHEXOL 240 MG/ML SOLN, 7mL OMNIPAQUE IOHEXOL 240 MG/ML SOLN FLUOROSCOPY TIME:  Fluoroscopy Time: 8 minutes 24 seconds (13 20 mGy). COMPLICATIONS: None PROCEDURE: Informed consent was obtained from the patient following explanation of the procedure, risks, benefits and alternatives. The patient understands, agrees and consents for the procedure. All questions were addressed. A time out was performed prior to the initiation of the procedure. Maximal barrier sterile technique utilized including caps, mask, sterile gowns, sterile gloves, large sterile drape, hand hygiene, and Betadine prep. Ultrasound survey of the left inguinal region was performed with images stored and sent to PACs, confirming patency of the vessel. A micropuncture needle was used access the left common femoral artery under ultrasound. With excellent arterial blood flow returned, and an .018 micro wire was passed through the needle, observed enter the abdominal aorta under fluoroscopy. The needle was removed, and a micropuncture sheath was placed over the wire. The inner dilator and wire were removed, and an 035 Bentson wire was advanced under fluoroscopy into the abdominal aorta. The sheath was removed and a standard 5 [place] vascular sheath was placed. The dilator was removed and the sheath was flushed. C2 cobra catheter was then advanced on the Bentson wire into the abdominal aorta. Cobra catheter was used to select the SMA origin. Angiogram was performed with multiple obliquity and magnification. Glidewire was then used to navigate the base catheter further into the SMA. A high-flow Renegade microcatheter was then advanced on a 16 fathom wire into the targeted arteries for super selective angiogram of the inferior pancreaticoduodenal arteries. Some vaso spasm was created within the targeted arteries, however,  angiogram revealed some abnormal mucosal blush without extravasation in the tissues immediately adjacent to the surgical clips. The high-flow Renegade was then removed, and a 105 cm STC microcatheter was then advanced with the 16 fathom wire. With an adequate catheter position in the targeted artery, coil embolization was performed with a combination of 2 mm and  3 mm coils to stasis. Microcatheter and microwire were removed, the base catheter was withdrawn, and a final angiogram was performed of the SMA territory. Cobra catheter was then withdrawn into the aorta, Bentson wire was passed, and the catheter was further advanced to above the celiac origin. Catheter was then used to engage the celiac artery origin and a celiac artery angiogram was performed. All catheters wires were then removed. Exoseal was deployed at the left common femoral artery puncture site. Sterile dressing was placed. Patient tolerated the procedure well and remained hemodynamically stable throughout. No complications were encountered and no significant blood loss. IMPRESSION: Status post ultrasound-guided access left common femoral artery for mesenteric angiogram and super selective coil embolization of the inferior pancreaticoduodenal arteries, contributing to abnormal mucosal blush at the site of the surgical clips in the duodenum. Exoseal for hemostasis. Signed, Dulcy Fanny. Dellia Nims, RPVI V[facility] and Inter[facility] Radiology Specialists [facility] Radiology Electronically Signed   By: Corrie Mckusick D.O.   On: 12/13/2020 12:53   DG Chest Port 1 View  Result Date: 12/10/2020 CLINICAL DATA:  Shortness of breath and weakness over the last 4 days EXAM: PORTABLE CHEST 1 VIEW COMPARISON:  12/04/2020 FINDINGS: Stable mild to moderate enlargement of the cardiopericardial silhouette, without edema. Atherosclerotic calcification of the aortic arch. Thoracic spondylosis. No blunting of the costophrenic angles. Mild degenerative glenohumeral  arthropathy bilaterally. IMPRESSION: 1. Stable mild to moderate enlargement of the cardiopericardial silhouette, without edema. 2.  Aortic Atherosclerosis (ICD10-I70.0). 3. Thoracic spondylosis. Electronically Signed   By: Van Clines M.D.   On: 12/10/2020 12:06   VAS [place] GROIN PSEUDOANEURYSM  Result Date: 12/14/2020  ARTERIAL PSEUDOANEURYSM  Patient Name:  CARALINA NOP  Date of Exam:   12/14/2020 Medical Rec #: [id number]          Accession #:    [id number] Date of Birth: [birth year]          Patient Gender: F Patient Age:   47 years Exam Location:  [facility] Procedure:      VAS [place] GROIN PSEUDOANEURYSM Referring Phys: Soyla Dryer --------------------------------------------------------------------------------  Exam: Right groin Indications: Patient complains of s/p right femoral artery thrombin injection. History: S/p catheterization. Comparison Study: No prior study Performing Technologist: Maudry Mayhew MHA, RDMS, RVT, RDCS  Examination Guidelines: A complete evaluation includes B-mode imaging, spectral Doppler, color Doppler, and power Doppler as needed of all accessible portions of each vessel. Bilateral testing is considered an integral part of a complete examination. Limited examinations for reoccurring indications may be performed as noted. +------------+----------+---------+------+----------+ Right DuplexPSV (cm/s)Waveform PlaqueComment(s) +------------+----------+---------+------+----------+ CFA            114    triphasic                 +------------+----------+---------+------+----------+ PFA            119    biphasic                  +------------+----------+---------+------+----------+ Prox SFA       124    triphasic                 +------------+----------+---------+------+----------+ Right Vein comments:CFV patent and compressible  Findings: A hypoechoic structure measuring approximately 1.5 cm x 1.7 cm is visualized at the right groin with  ultrasound characteristics of a hematoma. This is suggestive of sucecssfulrombin injection.    --------------------------------------------------------------------------------    Preliminary    IR EMBO ART  VEN [employer] GUIDE  ROADMAPPING  INDICATION: 75 year old female presents for mesenteric angiogram and possible embolization for ongoing upper GI hemorrhage EXAM: ULTRASOUND-GUIDED ACCESS LEFT COMMON FEMORAL ARTERY MESENTERIC ANGIOGRAM COIL EMBOLIZATION OF INFERIOR PANCREATICODUODENAL ARTERIES EXOSEAL FOR HEMOSTASIS MEDICATIONS: None ANESTHESIA/SEDATION: Moderate (conscious) sedation was employed during this procedure. A total of Versed 2.0 mg and Fentanyl 75 mcg was administered intravenously. Moderate Sedation Time: 60 minutes. The patient's level of consciousness and vital signs were monitored continuously by radiology nursing throughout the procedure under my direct supervision. CONTRAST:  92mL OMNIPAQUE IOHEXOL 240 MG/ML SOLN, 62mL OMNIPAQUE IOHEXOL 240 MG/ML SOLN, 78mL OMNIPAQUE IOHEXOL 240 MG/ML SOLN, 63mL OMNIPAQUE IOHEXOL 240 MG/ML SOLN FLUOROSCOPY TIME:  Fluoroscopy Time: 8 minutes 24 seconds (13 20 mGy). COMPLICATIONS: None PROCEDURE: Informed consent was obtained from the patient following explanation of the procedure, risks, benefits and alternatives. The patient understands, agrees and consents for the procedure. All questions were addressed. A time out was performed prior to the initiation of the procedure. Maximal barrier sterile technique utilized including caps, mask, sterile gowns, sterile gloves, large sterile drape, hand hygiene, and Betadine prep. Ultrasound survey of the left inguinal region was performed with images stored and sent to PACs, confirming patency of the vessel. A micropuncture needle was used access the left common femoral artery under ultrasound. With excellent arterial blood flow returned, and an .018 micro wire was passed through the needle, observed enter  the abdominal aorta under fluoroscopy. The needle was removed, and a micropuncture sheath was placed over the wire. The inner dilator and wire were removed, and an 035 Bentson wire was advanced under fluoroscopy into the abdominal aorta. The sheath was removed and a standard 5 [place] vascular sheath was placed. The dilator was removed and the sheath was flushed. C2 cobra catheter was then advanced on the Bentson wire into the abdominal aorta. Cobra catheter was used to select the SMA origin. Angiogram was performed with multiple obliquity and magnification. Glidewire was then used to navigate the base catheter further into the SMA. A high-flow Renegade microcatheter was then advanced on a 16 fathom wire into the targeted arteries for super selective angiogram of the inferior pancreaticoduodenal arteries. Some vaso spasm was created within the targeted arteries, however, angiogram revealed some abnormal mucosal blush without extravasation in the tissues immediately adjacent to the surgical clips. The high-flow Renegade was then removed, and a 105 cm STC microcatheter was then advanced with the 16 fathom wire. With an adequate catheter position in the targeted artery, coil embolization was performed with a combination of 2 mm and 3 mm coils to stasis. Microcatheter and microwire were removed, the base catheter was withdrawn, and a final angiogram was performed of the SMA territory. Cobra catheter was then withdrawn into the aorta, Bentson wire was passed, and the catheter was further advanced to above the celiac origin. Catheter was then used to engage the celiac artery origin and a celiac artery angiogram was performed. All catheters wires were then removed. Exoseal was deployed at the left common femoral artery puncture site. Sterile dressing was placed. Patient tolerated the procedure well and remained hemodynamically stable throughout. No complications were encountered and no significant blood loss. IMPRESSION:  Status post ultrasound-guided access left common femoral artery for mesenteric angiogram and super selective coil embolization of the inferior pancreaticoduodenal arteries, contributing to abnormal mucosal blush at the site of the surgical clips in the duodenum. Exoseal for hemostasis. Signed, Dulcy Fanny. Dellia Nims, RPVI [facility] Radiology Electronically Signed   By:  Corrie Mckusick D.O.   On: 12/13/2020 12:53   CT Angio Abd/Pel W and/or Wo Contrast  Result Date: 12/10/2020 CLINICAL DATA:  GI bleed, weakness EXAM: CTA ABDOMEN AND PELVIS WITHOUT AND WITH CONTRAST TECHNIQUE: Multidetector CT imaging of the abdomen and pelvis was performed using the standard protocol during bolus administration of intravenous contrast. Multiplanar reconstructed images and MIPs were obtained and reviewed to evaluate the vascular anatomy. CONTRAST:  185mL OMNIPAQUE IOHEXOL 350 MG/ML SOLN COMPARISON:  CT abdomen pelvis, 11/14/2019, interventional angiogram, 12/05/2020 FINDINGS: VASCULAR Normal contour and caliber of the abdominal aorta. No evidence of aortic aneurysm, dissection, or other acute aortic pathology. Moderate mixed calcific atherosclerosis. Standard branching pattern of the abdominal aorta, with solitary bilateral renal arteries. The branch vessel origins are patent. Coil material within the pancreaticoduodenal and gastroduodenal arteries. 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 Review of the MIP images confirms the above findings. NON-VASCULAR Lower chest: No acute abnormality.  Coronary artery calcifications. Hepatobiliary: No solid liver abnormality is seen. No gallstones, gallbladder wall thickening, or biliary dilatation. Pancreas: Unremarkable. No pancreatic ductal dilatation or surrounding inflammatory changes. Spleen: Normal in size without  significant abnormality. Adrenals/Urinary Tract: Adrenal glands are unremarkable. Kidneys are normal, without renal calculi, solid lesion, or hydronephrosis. Bladder is unremarkable. Stomach/Bowel: Stomach is within normal limits. Appendix appears normal. No evidence of bowel wall thickening, distention, or inflammatory changes. Lymphatic: No enlarged abdominal or pelvic lymph nodes. Reproductive: No mass or other significant abnormality. Other: No abdominal wall hernia or abnormality. No abdominopelvic ascites. Musculoskeletal: No acute or significant osseous findings. IMPRESSION: 1. Normal contour and caliber of the abdominal aorta. No evidence of aortic aneurysm, dissection, or other acute aortic pathology. Moderate mixed calcific atherosclerosis. 2. 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 3. No contrast extravasation within the bowel lumen. 4. Coil material within the pancreaticoduodenal and gastroduodenal arteries, per recent coiling procedure. 5. Coronary artery disease. Aortic Atherosclerosis (ICD10-I70.0). Electronically Signed   By: Eddie Candle M.D.   On: 12/10/2020 13:25    Labs:  CBC: Recent Labs    12/11/20 2200 12/12/20 0814 12/12/20 1718 12/13/20 0115 12/14/20 0137  WBC 8.1 6.1  --  5.9 6.3  HGB 8.5* 8.3* 8.5* 7.8* 7.5*  HCT 25.8* 25.6* 26.1* 24.7* 23.4*  PLT 184 203  --  210 227    COAGS: Recent Labs    10/19/20 1919 10/20/20 0439 12/10/20 1120  INR 1.0 1.1 1.1  APTT  --  29  --     BMP: Recent Labs    12/27/19 1354 07/04/20 0910 12/10/20 1120 12/11/20 0627 12/11/20 1206 12/13/20 0115 12/14/20 0137  NA 135   < > 136 137 138 136 136  K 3.7   < > 3.9 3.9 4.1 3.4* 3.6  CL 101   < > 106 108 107 107 105  CO2 25   < > 25 27  --  23 24  GLUCOSE 105*   < > 119* 103* 100* 99 87  BUN 10   < > 20 17 16 11 9   CALCIUM 8.9   < > 7.9* 7.8*  --  8.0* 8.2*  CREATININE  0.61   < > 0.61 0.62 0.50 0.70 0.72  GFRNONAA >60   < > >60 >60  --  >60 >60  GFRAA >60  --   --   --   --   --   --    < > = values in this interval not displayed.    LIVER FUNCTION TESTS: Recent Labs    10/19/20 1919 10/20/20 0439 12/04/20 1223 12/10/20 1120  BILITOT 0.4 0.8 0.5 0.6  AST 35 32 24 20  ALT 33 30 18 14   ALKPHOS 28* 21* 25* 22*  PROT 5.7* 5.0* 5.5* 5.6*  ALBUMIN 3.6 3.1* 3.6 3.1*    Assessment and Plan: Pt with hx GI bleed, s/p  ultrasound-guided access left common femoral artery for mesenteric angiogram and super selective coil embolization of the  inferior pancreaticoduodenal arteries, contributing to abnormal  mucosal blush at the site of the surgical clips in the duodenum; also s/p thrombin injection rt CFA PSA 9/21; afebrile; WBC nl; hgb 7.5(7.8), creat nl; f/u vasc study -A hypoechoic structure measuring approximately 1.5 cm x 1.7 cm is  visualized at the right groin with ultrasound characteristics of a  hematoma. This is suggestive of sucecssfulrombin injection; pt stable; will plan f/u vasc US of rt groin in IR clinic in 1 month   Electronically Signed: D. Rowe Robert, PA-C 12/14/2020, 11:19 AM   I spent a total of 15 Minutes at the the patient's bedside AND on the patient's hospital floor or unit, greater than 50% of which was counseling/coordinating care for visceral arteriogram with coil embolization    Patient ID: Stacey Reyes, female   DOB: 1945/10/12, 75 y.o.   MRN: [id number]

## 2020-12-14 NOTE — Progress Notes (Signed)
PROGRESS NOTE    DAKSHA KOONE  ZWC:[id number] DOB: Dec 10, 1945 DOA: 12/10/2020 PCP: [name], DO    Brief Narrative:  Mrs. 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   75 year old female past medical history for hypertension, GI bleed, hypertrophic cardiomyopathy who presented with weakness.  Reported difficulty breathing with activity, easy fatigability and generalized weakness.  Recent hospitalization 9/12-11/2013 for acute on chronic blood loss anemia due to Dieulafoy lesion. On admission blood pressure was 98/80, heart rate 85, respiratory rate 13, oxygen saturation 97%, temperature 98.1, her lungs are clear to auscultation bilaterally, she had a systolic heart murmur, M3-N3, present, abdomen soft, no lower extremity edema.  Sodium 136, potassium 3.9, chloride 106, bicarb 25, glucose 119, BUN 20, creatinine 0.61, AST 20, ALT 14, white count 8.7, hemoglobin 5.6, hematocrit 18.0, platelets 238. SARS COVID-19 negative.  CT chest abdomen pelvic angiography negative for aortic aneurysm or dissection.  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  Chest radiograph no infiltrates.  EKG 106 bpm, normal axis, normal intervals, sinus rhythm, Q-wave V1-V2, ST depressions, T wave versions V5-V6, positive LVH.  Patient received 4 packed RBC transfusion.  9/19 patient underwent EGD which showed normal stomach, blood in the duodenal bulb and second portion of the duodenum.  Pulsating vessel with an active bleeding consistent with Dieulafoy lesion status post epi and 3 hemostatic clips which slowed the bleeding but finally required hemospray.  Transfer [facility] for further interventional radiology.  9/21 patient underwent coil embolization of inferior pancreatico duodenal artery from SMA.    Assessment & Plan:   Principal Problem:   Acute blood loss anemia Active  Problems:   Hypertension, essential   Cardiac murmur due to mitral valve disorder   Hypertrophic cardiomyopathy (HCC)   Upper GI bleed   GI bleed   Acute blood loss anemia due to upper GI bleed, duodenal dieulafoy lesion. Iron deficiency anemia Hgb is 7,5 with hct at 23,4 patient is sp 4 units PRBC. No clinical sings of active bleeding today.  Iron level 47, TIBC 353, Transferrin saturation 13 and ferritin 236 on 08/22.   Plan to continue with pantoprazole, change to po,. If patient has not received IV iron recently, will proceed with iron infusion. Follow up cell count in am, PRBC transfusion for Hgb less than 7.   2. Hypertrophic cardiomyopathy. NO clinical signs of heart failure.  Continue blood pressure monitoring   Continue with statin therapy.   3. Dyslipidemia, HTN. Will resume bisprolol for blood pressure control.   4. Obesity class 1. Calculated BMI is 31,8    Status is: Inpatient  Remains inpatient appropriate because:Inpatient level of care appropriate due to severity of illness  Dispo: The patient is from: Home              Anticipated d/c is to: Home              Patient currently is not medically stable to d/c.   Difficult to place patient No  DVT prophylaxis: Scd   Code Status:    full  Family Communication:  I spoke with patient's sister at the bedside, we talked in detail about patient's condition, plan of care and prognosis and all questions were addressed.     Consultants:  GI  IR   Procedures:  IR SMA embolectomy    Subjective: Patient is feeling better, no abdominal pain,  no nausea or vomiting, no dyspnea or chest pain, positive constipation.   Objective: Vitals:   12/14/20 1114 12/14/20 1200 12/14/20 1223 12/14/20 1324  BP:   119/74 96/68  Pulse:  78 85 91  Resp:  17 (!) 23 19  Temp: 98.3 F (36.8 C)     TempSrc: Oral     SpO2:  97% 93% 95%  Weight:      Height:        Intake/Output Summary (Last 24 hours) at 12/14/2020  1514 Last data filed at 12/14/2020 1329 Gross per 24 hour  Intake 725 ml  Output --  Net 725 ml   Filed Weights   12/10/20 1700 12/12/20 2100  Weight: 83.3 kg 86.9 kg    Examination:   General: Not in pain or dyspnea,  Neurology: Awake and alert, non focal  E IRJ:JOAC pallor, no icterus, oral mucosa moist Cardiovascular: No JVD. S1-S2 present, rhythmic, no gallops, rubs, or murmurs. No lower extremity edema. Pulmonary: positive breath sounds bilaterally, adequate air movement, no wheezing, rhonchi or rales. Gastrointestinal. Abdomen mild distended but not tender to superficial palpation Skin. No rashes Musculoskeletal: no joint deformities     Data Reviewed: I have personally reviewed following labs and imaging studies  CBC: Recent Labs  Lab 12/10/20 1120 12/10/20 2040 12/11/20 1407 12/11/20 2200 12/12/20 0814 12/12/20 1718 12/13/20 0115 12/14/20 0137  WBC 8.7   < > 8.7 8.1 6.1  --  5.9 6.3  NEUTROABS 6.0  --   --   --   --   --   --   --   HGB 5.6*   < > 8.7* 8.5* 8.3* 8.5* 7.8* 7.5*  HCT 18.0*   < > 27.1* 25.8* 25.6* 26.1* 24.7* 23.4*  MCV 99.4   < > 94.4 93.5 94.8  --  93.9 93.2  PLT 238   < > 187 184 203  --  210 227   < > = values in this interval not displayed.   Basic Metabolic Panel: Recent Labs  Lab 12/10/20 1120 12/11/20 0627 12/11/20 1206 12/13/20 0115 12/14/20 0137  NA 136 137 138 136 136  K 3.9 3.9 4.1 3.4* 3.6  CL 106 108 107 107 105  CO2 25 27  --  23 24  GLUCOSE 119* 103* 100* 99 87  BUN 20 17 16 11 9   CREATININE 0.61 0.62 0.50 0.70 0.72  CALCIUM 7.9* 7.8*  --  8.0* 8.2*  MG  --   --   --  2.0 2.0   GFR: Estimated Creatinine Clearance: 66.2 mL/min (by C-G formula based on SCr of 0.72 mg/dL). Liver Function Tests: Recent Labs  Lab 12/10/20 1120  AST 20  ALT 14  ALKPHOS 22*  BILITOT 0.6  PROT 5.6*  ALBUMIN 3.1*   No results for input(s): LIPASE, AMYLASE in the last 168 hours. No results for input(s): AMMONIA in the last 168  hours. Coagulation Profile: Recent Labs  Lab 12/10/20 1120  INR 1.1   Cardiac Enzymes: No results for input(s): CKTOTAL, CKMB, CKMBINDEX, TROPONINI in the last 168 hours. BNP (last 3 results) Recent Labs    10/19/20 1244 10/25/20 1112  PROBNP 2,974* 1,440*   HbA1C: No results for input(s): HGBA1C in the last 72 hours. CBG: Recent Labs  Lab 12/12/20 1722  GLUCAP 83   Lipid Profile: No results for input(s): CHOL, HDL, LDLCALC, TRIG, CHOLHDL, LDLDIRECT in the last 72 hours. Thyroid Function Tests: No results for input(s): TSH, T4TOTAL, FREET4, T3FREE,  THYROIDAB in the last 72 hours. Anemia Panel: No results for input(s): VITAMINB12, FOLATE, FERRITIN, TIBC, IRON, RETICCTPCT in the last 72 hours.    Radiology Studies: I have reviewed all of the imaging during this hospital visit personally     Scheduled Meds:  sodium chloride   Intravenous Once   Chlorhexidine Gluconate Cloth  6 each Topical Q0600   melatonin  6 mg Oral QHS   pantoprazole  40 mg Oral BID AC   rosuvastatin  10 mg Oral q1800   Continuous Infusions:   LOS: 2 days        Nickolai Rinks Gerome Apley, MD

## 2020-12-14 NOTE — Telephone Encounter (Signed)
Called pt to schedule appt and she stats she was discharged from [facility] and now is in [place]. Will schedule when gets back home

## 2020-12-15 LAB — HEMOGLOBIN AND HEMATOCRIT, BLOOD
HCT: 24.1 % — ABNORMAL LOW (ref 36.0–46.0)
Hemoglobin: 8 g/dL — ABNORMAL LOW (ref 12.0–15.0)

## 2020-12-15 NOTE — Consult Note (Signed)
   [facility] Inpatient Consult   12/15/2020  MARISAH LAKER [birth year] [id number]  [facility] [[facility]] Patient: [employer]   Primary Care Provider:  [name], DO, [facility]  Patient screened for disposition/transition of care noted with less than 7 days readmission w to assess for potential [facility] [facility] service needs for post hospital transition.  Review of patient's medical record reveals patient was not a readmission but transferred to [facility] from [facility]. No needs for CCM noted at this time.  Will have TOC as listed by Embedded provider office at [place].  Plan:  Continue to follow progress and disposition to assess for post hospital care management needs.    For questions contact:   Natividad Brood, RN BSN [facility] Liaison  [phone] business mobile phone Toll free office [phone]  Fax number: [phone] [email or web address]

## 2020-12-15 NOTE — Discharge Summary (Signed)
Physician Discharge Summary  MERLIE NOGA SAY:[id number] DOB: October 05, 1945 DOA: 12/10/2020  PCP: [name], DO  Admit date: 12/10/2020 Discharge date: 12/15/2020  Admitted From: home (transferred from [facility]).   Disposition:  Home   Recommendations for Outpatient Follow-up and new medication changes:  Follow up with Dr. Lajuana Ripple in 7 to 10 days. Follow up cell count and iron levels as outpatient. (Patient had IV iron on 10/2020). Holding on oral iron for now.   Home Health: no   Equipment/Devices: na    Discharge Condition: stable  CODE STATUS: full  Diet recommendation: heart healthy   Brief/Interim Summary: Mrs. Mckinzie was admitted to the hospital with the working diagnosis of upper GI bleed, active bleeding from AVM/ Dieulafoy lesion in the duodenum.    75 year old female past medical history for hypertension, GI bleed, hypertrophic cardiomyopathy who presented with weakness.  Reported difficulty breathing with activity, easy fatigability and generalized weakness.  Recent hospitalization 9/12-11/2013 for acute on chronic blood loss anemia due to Dieulafoy lesion. On admission blood pressure was 98/80, heart rate 85, respiratory rate 13, oxygen saturation 97%, temperature 98.1, her lungs were clear to auscultation bilaterally, she had a systolic heart murmur, A3-F5, present, abdomen soft, no lower extremity edema.   Sodium 136, potassium 3.9, chloride 106, bicarb 25, glucose 119, BUN 20, creatinine 0.61, AST 20, ALT 14, white count 8.7, hemoglobin 5.6, hematocrit 18.0, platelets 238. SARS COVID-19 negative.   CT chest abdomen pelvic angiography negative for aortic aneurysm or dissection.  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   Chest radiograph no infiltrates.   EKG 106 bpm, normal axis, normal intervals, sinus rhythm, Q-wave V1-V2, ST depressions, T wave versions V5-V6, positive LVH.   Patient received 4 packed RBC transfusion.   9/19  patient underwent EGD which showed normal stomach, blood in the duodenal bulb and second portion of the duodenum.  Pulsating vessel with an active bleeding consistent with Dieulafoy lesion status post epi and 3 hemostatic clips which slowed the bleeding but finally required hemospray.   Transfer [facility] for further interventional radiology.   9/21 patient underwent coil embolization of inferior pancreatico duodenal artery from SMA.   Acute blood loss anemia due to upper GI bleed, duodenal Dieulafoy lesion.  Iron deficiency anemia. Patient was admitted to the stepdown unit, her hemoglobin and hematocrit remained stable after 4 unit packed red blood cell transfusion. Tolerated well interventional radiology procedure with coiling embolization of inferior pancreaticoduodenal artery from SMA. Also underwent thrombin injection to the right CFA.   Patient received prompt a pop in uterus with pantoprazole. Discharge hemoglobin 8.0, hematocrit 24.1.  Iron panel from 8/26, serum iron 47, TIBC 353, transferrin saturation 13 and ferritin 236. Patient received Feraheme intravenously 11/09/20 and 11/17/20. Follow-up iron levels as an outpatient.  2.  Hypertrophic cardiomyopathy/hypertension..  No clinical signs of heart failure. Continue blood pressure control. Echocardiogram from 08/29 with EF >73% systolic anterior motion of the mitral valve.  Severe concentric LVH.  Severe dilatation of left atrium.  Mild aortic valve stenosis.  Continue bisoprolol.  3.  Dyslipidemia.  Continue statin therapy.  4.  Obesity class I.  Calculated BMI 31.8.  Discharge Diagnoses:  Principal Problem:   Acute blood loss anemia Active Problems:   Hypertension, essential   Cardiac murmur due to mitral valve disorder   Hypertrophic cardiomyopathy ([place])   Upper GI bleed   GI bleed    Discharge Instructions   Allergies as of 12/15/2020  Reactions   Sulfa Antibiotics Rash        Medication List      TAKE these medications    bisoprolol 5 MG tablet Commonly known as: ZEBETA Take 0.5 tablets (2.5 mg total) by mouth daily.   CALCIUM-MAGNESIUM-ZINC-D3 PO Take 1 tablet by mouth daily.   clobetasol ointment 0.05 % Commonly known as: TEMOVATE Apply 1 application topically 2 (two) times daily as needed (skin).   COPPER CAPS PO Take 1 capsule by mouth daily.   cyanocobalamin 2000 MCG tablet Commonly known as: [employer]S VITAMIN B12 Take 1 tablet (2,000 mcg total) by mouth daily.   Magnesium 200 MG Tabs Take 1 tablet (200 mg total) by mouth daily.   multivitamin capsule Take 1 capsule by mouth daily.   pantoprazole 40 MG tablet Commonly known as: PROTONIX 1 po BID x 8 weeks, then 1 po daily What changed:  how much to take how to take this when to take this additional instructions   rosuvastatin 10 MG tablet Commonly known as: CRESTOR TAKE 1 TABLET BY MOUTH AT  BEDTIME What changed: when to take this   vitamin C 1000 MG tablet Take 1,000 mg by mouth daily.   vitamin E 180 MG (400 UNITS) capsule Take 400 Units by mouth daily.        Allergies  Allergen Reactions   Sulfa Antibiotics Rash    Consultations: IR  GI    Procedures/Studies: IR Angiogram Visceral Selective  Result Date: 12/13/2020 INDICATION: 75 year old female presents for mesenteric angiogram and possible embolization for ongoing upper GI hemorrhage EXAM: ULTRASOUND-GUIDED ACCESS LEFT COMMON FEMORAL ARTERY MESENTERIC ANGIOGRAM COIL EMBOLIZATION OF INFERIOR PANCREATICODUODENAL ARTERIES EXOSEAL FOR HEMOSTASIS MEDICATIONS: None ANESTHESIA/SEDATION: Moderate (conscious) sedation was employed during this procedure. A total of Versed 2.0 mg and Fentanyl 75 mcg was administered intravenously. Moderate Sedation Time: 60 minutes. The patient's level of consciousness and vital signs were monitored continuously by radiology nursing throughout the procedure under my direct supervision. CONTRAST:  43m OMNIPAQUE  IOHEXOL 240 MG/ML SOLN, 47mOMNIPAQUE IOHEXOL 240 MG/ML SOLN, 4125mMNIPAQUE IOHEXOL 240 MG/ML SOLN, 37m45mNIPAQUE IOHEXOL 240 MG/ML SOLN FLUOROSCOPY TIME:  Fluoroscopy Time: 8 minutes 24 seconds (13 20 mGy). COMPLICATIONS: None PROCEDURE: Informed consent was obtained from the patient following explanation of the procedure, risks, benefits and alternatives. The patient understands, agrees and consents for the procedure. All questions were addressed. A time out was performed prior to the initiation of the procedure. Maximal barrier sterile technique utilized including caps, mask, sterile gowns, sterile gloves, large sterile drape, hand hygiene, and Betadine prep. Ultrasound survey of the left inguinal region was performed with images stored and sent to PACs, confirming patency of the vessel. A micropuncture needle was used access the left common femoral artery under ultrasound. With excellent arterial blood flow returned, and an .018 micro wire was passed through the needle, observed enter the abdominal aorta under fluoroscopy. The needle was removed, and a micropuncture sheath was placed over the wire. The inner dilator and wire were removed, and an 035 Bentson wire was advanced under fluoroscopy into the abdominal aorta. The sheath was removed and a standard 5 FrenPakistancular sheath was placed. The dilator was removed and the sheath was flushed. C2 cobra catheter was then advanced on the Bentson wire into the abdominal aorta. Cobra catheter was used to select the SMA origin. Angiogram was performed with multiple obliquity and magnification. Glidewire was then used to navigate the base catheter further into the SMA. A high-flow Renegade  microcatheter was then advanced on a 16 fathom wire into the targeted arteries for super selective angiogram of the inferior pancreaticoduodenal arteries. Some vaso spasm was created within the targeted arteries, however, angiogram revealed some abnormal mucosal blush without  extravasation in the tissues immediately adjacent to the surgical clips. The high-flow Renegade was then removed, and a 105 cm STC microcatheter was then advanced with the 16 fathom wire. With an adequate catheter position in the targeted artery, coil embolization was performed with a combination of 2 mm and 3 mm coils to stasis. Microcatheter and microwire were removed, the base catheter was withdrawn, and a final angiogram was performed of the SMA territory. Cobra catheter was then withdrawn into the aorta, Bentson wire was passed, and the catheter was further advanced to above the celiac origin. Catheter was then used to engage the celiac artery origin and a celiac artery angiogram was performed. All catheters wires were then removed. Exoseal was deployed at the left common femoral artery puncture site. Sterile dressing was placed. Patient tolerated the procedure well and remained hemodynamically stable throughout. No complications were encountered and no significant blood loss. IMPRESSION: Status post ultrasound-guided access left common femoral artery for mesenteric angiogram and super selective coil embolization of the inferior pancreaticoduodenal arteries, contributing to abnormal mucosal blush at the site of the surgical clips in the duodenum. Exoseal for hemostasis. Signed, Dulcy Fanny. Dellia Nims, RPVI [facility] Radiology Electronically Signed   By: Corrie Mckusick D.O.   On: 12/13/2020 12:53   IR Angiogram Visceral Selective  Result Date: 12/06/2020 INDICATION: History of chronic intermittent GI bleeding with endoscopy performed earlier today demonstrating acute bleeding at the level of the duodenum. As such, patient presents for mesenteric arteriogram and embolization. EXAM: 1. ULTRASOUND GUIDANCE FOR ARTERIAL ACCESS 2. SELECTIVE CELIAC ARTERIOGRAM 3. SUB SELECTIVE COMMON HEPATIC ARTERIOGRAM 4. SUB SELECTIVE PANCREATICODUODENAL ARTERIOGRAM AND PERCUTANEOUS  COIL EMBOLIZATION 5. SUB SELECTIVE GASTRODUODENAL ARTERIOGRAM AND PERCUTANEOUS COIL EMBOLIZATION COMPARISON:  CT abdomen and pelvis-11/14/2019 MEDICATIONS: None ANESTHESIA/SEDATION: Moderate (conscious) sedation was employed during this procedure. A total of Versed 2 mg and Fentanyl 100 mcg was administered intravenously. Moderate Sedation Time: 65 minutes. The patient's level of consciousness and vital signs were monitored continuously by radiology nursing throughout the procedure under my direct supervision. CONTRAST:  70 cc Omnipaque 350 FLUOROSCOPY TIME:  20 minutes, 30 seconds (0,973 mGy) COMPLICATIONS: None immediate. PROCEDURE: Informed consent was obtained from the patient following explanation of the procedure, risks, benefits and alternatives. All questions were addressed. A time out was performed prior to the initiation of the procedure. Maximal barrier sterile technique utilized including caps, mask, sterile gowns, sterile gloves, large sterile drape, hand hygiene, and Betadine prep. The right femoral head was marked fluoroscopically. Under sterile conditions and local anesthesia, the right common femoral artery access was performed with a micropuncture needle. Under direct ultrasound guidance, the right common femoral was accessed with a micropuncture kit. An ultrasound image was saved for documentation purposes. This allowed for placement of a 5-French vascular sheath. A limited arteriogram was performed through the side arm of the sheath confirming appropriate access within the right common femoral artery. Over a Bentson wire, a Mickelson catheter was advanced the caudal aspect of the thoracic aorta where was reformed, back bled and flushed. The Mickelson catheter was then utilized to select the celiac artery selective celiac arteriogram was performed. Next, with the use of a fathom 16 microwire, a regular Renegade microcatheter was utilized to select the common  hepatic artery and a selective common  hepatic arteriogram was performed. The microcatheter was then utilized to select a hypertrophied pancreaticoduodenal artery arising from the proximal aspect of the GDA and a selective pancreaticoduodenal arteriogram was performed. The microcatheter was advanced to its mid/caudal aspect of the pancreaticoduodenal artery and vessel was subsequently percutaneously coil embolized with multiple overlapping 2 mm, 3 mm and 4 mm soft and fibered interlock coils to near the vessel's origin. The microcatheter was then retracted to the level of the gastroduodenal artery and a selective gastroduodenal arteriogram was performed. The microcatheter was advanced to the distal aspect of the GDA, at the takeoff of the right gastroepiploic artery. Next, the GDA was percutaneously coil embolized with multiple overlapping 5 mm, 6 mm, 8 mm and 10 mm diameter soft and fibered interlock coils. The microcatheter was retracted to the level of the common hepatic artery and post embolization common hepatic arteriograms were performed. Images were reviewed and the procedure was terminated. All wires, catheters and sheaths were removed from the patient. Hemostasis was achieved at the right groin access site with deployment of an ExoSeal closure device and manual compression. The patient tolerated the procedure well without immediate post procedural complication. FINDINGS: Selective celiac arteriogram demonstrates conventional takeoff of the GDA however note is made of a prominent pancreaticoduodenal artery which arises from the proximal GDA. No discrete areas contrast extravasation or vessel irregularity are identified angiographically. Given endoscopic confirmation duodenal bleeding, the decision was made to proceed with prophylactic embolization. Selective pancreaticoduodenal arteriogram confirms the vessel contribute multiple distal tributaries to the descending portion of the duodenum and as such the vessel was successfully percutaneously  coil embolized with multiple overlapping coils. Selective gastroduodenal arteriogram demonstrates conventional branching pattern, terminating in a right gastroepiploic artery. The gastroduodenal artery was then successfully percutaneously embolized with multiple overlapping coils to near the origin of the GDA. Completion common hepatic arteriogram demonstrates a technically excellent result with no flow into either the GDA or pancreaticoduodenal arteries. IMPRESSION: Technically successful prophylactic percutaneous coil embolization of the GDA and the pancreaticoduodenal artery for endoscopically confirmed acute upper GI/duodenal bleeding. PLAN: - The patient is to remain flat for 4 hours with right leg straight. - The patient will continue to experience several additional bloody bowel movements and may continue to require additional resuscitation (as she was bleeding both before the procedure and the endoscopy unit), however ultimately I am hopeful she will stabilize in the coming days muted rest of resuscitation - Repeat endoscopy may be performed at the discretion of the GI service as indicated. Electronically Signed   By: Sandi Mariscal M.D.   On: 12/06/2020 13:28   IR Angiogram Selective Each Additional Vessel  Result Date: 12/14/2020 INDICATION: 75 year old female presents for mesenteric angiogram and possible embolization for ongoing upper GI hemorrhage EXAM: ULTRASOUND-GUIDED ACCESS LEFT COMMON FEMORAL ARTERY MESENTERIC ANGIOGRAM COIL EMBOLIZATION OF INFERIOR PANCREATICODUODENAL ARTERIES EXOSEAL FOR HEMOSTASIS MEDICATIONS: None ANESTHESIA/SEDATION: Moderate (conscious) sedation was employed during this procedure. A total of Versed 2.0 mg and Fentanyl 75 mcg was administered intravenously. Moderate Sedation Time: 60 minutes. The patient's level of consciousness and vital signs were monitored continuously by radiology nursing throughout the procedure under my direct supervision. CONTRAST:  45m OMNIPAQUE  IOHEXOL 240 MG/ML SOLN, 462mOMNIPAQUE IOHEXOL 240 MG/ML SOLN, 4161mMNIPAQUE IOHEXOL 240 MG/ML SOLN, 24m110mNIPAQUE IOHEXOL 240 MG/ML SOLN FLUOROSCOPY TIME:  Fluoroscopy Time: 8 minutes 24 seconds (13 20 mGy). COMPLICATIONS: None PROCEDURE: Informed consent was obtained from the patient following explanation of the  procedure, risks, benefits and alternatives. The patient understands, agrees and consents for the procedure. All questions were addressed. A time out was performed prior to the initiation of the procedure. Maximal barrier sterile technique utilized including caps, mask, sterile gowns, sterile gloves, large sterile drape, hand hygiene, and Betadine prep. Ultrasound survey of the left inguinal region was performed with images stored and sent to PACs, confirming patency of the vessel. A micropuncture needle was used access the left common femoral artery under ultrasound. With excellent arterial blood flow returned, and an .018 micro wire was passed through the needle, observed enter the abdominal aorta under fluoroscopy. The needle was removed, and a micropuncture sheath was placed over the wire. The inner dilator and wire were removed, and an 035 Bentson wire was advanced under fluoroscopy into the abdominal aorta. The sheath was removed and a standard 5 [place] vascular sheath was placed. The dilator was removed and the sheath was flushed. C2 cobra catheter was then advanced on the Bentson wire into the abdominal aorta. Cobra catheter was used to select the SMA origin. Angiogram was performed with multiple obliquity and magnification. Glidewire was then used to navigate the base catheter further into the SMA. A high-flow Renegade microcatheter was then advanced on a 16 fathom wire into the targeted arteries for super selective angiogram of the inferior pancreaticoduodenal arteries. Some vaso spasm was created within the targeted arteries, however, angiogram revealed some abnormal mucosal blush without  extravasation in the tissues immediately adjacent to the surgical clips. The high-flow Renegade was then removed, and a 105 cm STC microcatheter was then advanced with the 16 fathom wire. With an adequate catheter position in the targeted artery, coil embolization was performed with a combination of 2 mm and 3 mm coils to stasis. Microcatheter and microwire were removed, the base catheter was withdrawn, and a final angiogram was performed of the SMA territory. Cobra catheter was then withdrawn into the aorta, Bentson wire was passed, and the catheter was further advanced to above the celiac origin. Catheter was then used to engage the celiac artery origin and a celiac artery angiogram was performed. All catheters wires were then removed. Exoseal was deployed at the left common femoral artery puncture site. Sterile dressing was placed. Patient tolerated the procedure well and remained hemodynamically stable throughout. No complications were encountered and no significant blood loss. IMPRESSION: Status post ultrasound-guided access left common femoral artery for mesenteric angiogram and super selective coil embolization of the inferior pancreaticoduodenal arteries, contributing to abnormal mucosal blush at the site of the surgical clips in the duodenum. Exoseal for hemostasis. Signed, Dulcy Fanny. Dellia Nims, RPVI [facility] Radiology Electronically Signed   By: Corrie Mckusick D.O.   On: 12/13/2020 12:53   IR Angiogram Selective Each Additional Vessel  Result Date: 12/14/2020 INDICATION: 75 year old female presents for mesenteric angiogram and possible embolization for ongoing upper GI hemorrhage EXAM: ULTRASOUND-GUIDED ACCESS LEFT COMMON FEMORAL ARTERY MESENTERIC ANGIOGRAM COIL EMBOLIZATION OF INFERIOR PANCREATICODUODENAL ARTERIES EXOSEAL FOR HEMOSTASIS MEDICATIONS: None ANESTHESIA/SEDATION: Moderate (conscious) sedation was employed during this procedure. A total of  Versed 2.0 mg and Fentanyl 75 mcg was administered intravenously. Moderate Sedation Time: 60 minutes. The patient's level of consciousness and vital signs were monitored continuously by radiology nursing throughout the procedure under my direct supervision. CONTRAST:  68m OMNIPAQUE IOHEXOL 240 MG/ML SOLN, 424mOMNIPAQUE IOHEXOL 240 MG/ML SOLN, 4180mMNIPAQUE IOHEXOL 240 MG/ML SOLN, 26m45mNIPAQUE IOHEXOL 240 MG/ML SOLN FLUOROSCOPY TIME:  Fluoroscopy Time: 8 minutes  24 seconds (13 20 mGy). COMPLICATIONS: None PROCEDURE: Informed consent was obtained from the patient following explanation of the procedure, risks, benefits and alternatives. The patient understands, agrees and consents for the procedure. All questions were addressed. A time out was performed prior to the initiation of the procedure. Maximal barrier sterile technique utilized including caps, mask, sterile gowns, sterile gloves, large sterile drape, hand hygiene, and Betadine prep. Ultrasound survey of the left inguinal region was performed with images stored and sent to PACs, confirming patency of the vessel. A micropuncture needle was used access the left common femoral artery under ultrasound. With excellent arterial blood flow returned, and an .018 micro wire was passed through the needle, observed enter the abdominal aorta under fluoroscopy. The needle was removed, and a micropuncture sheath was placed over the wire. The inner dilator and wire were removed, and an 035 Bentson wire was advanced under fluoroscopy into the abdominal aorta. The sheath was removed and a standard 5 [place] vascular sheath was placed. The dilator was removed and the sheath was flushed. C2 cobra catheter was then advanced on the Bentson wire into the abdominal aorta. Cobra catheter was used to select the SMA origin. Angiogram was performed with multiple obliquity and magnification. Glidewire was then used to navigate the base catheter further into the SMA. A high-flow  Renegade microcatheter was then advanced on a 16 fathom wire into the targeted arteries for super selective angiogram of the inferior pancreaticoduodenal arteries. Some vaso spasm was created within the targeted arteries, however, angiogram revealed some abnormal mucosal blush without extravasation in the tissues immediately adjacent to the surgical clips. The high-flow Renegade was then removed, and a 105 cm STC microcatheter was then advanced with the 16 fathom wire. With an adequate catheter position in the targeted artery, coil embolization was performed with a combination of 2 mm and 3 mm coils to stasis. Microcatheter and microwire were removed, the base catheter was withdrawn, and a final angiogram was performed of the SMA territory. Cobra catheter was then withdrawn into the aorta, Bentson wire was passed, and the catheter was further advanced to above the celiac origin. Catheter was then used to engage the celiac artery origin and a celiac artery angiogram was performed. All catheters wires were then removed. Exoseal was deployed at the left common femoral artery puncture site. Sterile dressing was placed. Patient tolerated the procedure well and remained hemodynamically stable throughout. No complications were encountered and no significant blood loss. IMPRESSION: Status post ultrasound-guided access left common femoral artery for mesenteric angiogram and super selective coil embolization of the inferior pancreaticoduodenal arteries, contributing to abnormal mucosal blush at the site of the surgical clips in the duodenum. Exoseal for hemostasis. Signed, Dulcy Fanny. Dellia Nims, RPVI [facility] Inter[facility] Radiology Special[facility] Radiology Electronically Signed   By: Corrie Mckusick D.O.   On: 12/13/2020 12:53   IR Angiogram Selective Each Additional Vessel  Result Date: 12/06/2020 INDICATION: History of chronic intermittent GI bleeding with endoscopy performed earlier today demonstrating acute  bleeding at the level of the duodenum. As such, patient presents for mesenteric arteriogram and embolization. EXAM: 1. ULTRASOUND GUIDANCE FOR ARTERIAL ACCESS 2. SELECTIVE CELIAC ARTERIOGRAM 3. SUB SELECTIVE COMMON HEPATIC ARTERIOGRAM 4. SUB SELECTIVE PANCREATICODUODENAL ARTERIOGRAM AND PERCUTANEOUS COIL EMBOLIZATION 5. SUB SELECTIVE GASTRODUODENAL ARTERIOGRAM AND PERCUTANEOUS COIL EMBOLIZATION COMPARISON:  CT abdomen and pelvis-11/14/2019 MEDICATIONS: None ANESTHESIA/SEDATION: Moderate (conscious) sedation was employed during this procedure. A total of Versed 2 mg and Fentanyl 100 mcg was administered intravenously. Moderate Sedation  Time: 65 minutes. The patient's level of consciousness and vital signs were monitored continuously by radiology nursing throughout the procedure under my direct supervision. CONTRAST:  70 cc Omnipaque 350 FLUOROSCOPY TIME:  20 minutes, 30 seconds (0,086 mGy) COMPLICATIONS: None immediate. PROCEDURE: Informed consent was obtained from the patient following explanation of the procedure, risks, benefits and alternatives. All questions were addressed. A time out was performed prior to the initiation of the procedure. Maximal barrier sterile technique utilized including caps, mask, sterile gowns, sterile gloves, large sterile drape, hand hygiene, and Betadine prep. The right femoral head was marked fluoroscopically. Under sterile conditions and local anesthesia, the right common femoral artery access was performed with a micropuncture needle. Under direct ultrasound guidance, the right common femoral was accessed with a micropuncture kit. An ultrasound image was saved for documentation purposes. This allowed for placement of a 5-French vascular sheath. A limited arteriogram was performed through the side arm of the sheath confirming appropriate access within the right common femoral artery. Over a Bentson wire, a Mickelson catheter was advanced the caudal aspect of the thoracic aorta where  was reformed, back bled and flushed. The Mickelson catheter was then utilized to select the celiac artery selective celiac arteriogram was performed. Next, with the use of a fathom 16 microwire, a regular Renegade microcatheter was utilized to select the common hepatic artery and a selective common hepatic arteriogram was performed. The microcatheter was then utilized to select a hypertrophied pancreaticoduodenal artery arising from the proximal aspect of the GDA and a selective pancreaticoduodenal arteriogram was performed. The microcatheter was advanced to its mid/caudal aspect of the pancreaticoduodenal artery and vessel was subsequently percutaneously coil embolized with multiple overlapping 2 mm, 3 mm and 4 mm soft and fibered interlock coils to near the vessel's origin. The microcatheter was then retracted to the level of the gastroduodenal artery and a selective gastroduodenal arteriogram was performed. The microcatheter was advanced to the distal aspect of the GDA, at the takeoff of the right gastroepiploic artery. Next, the GDA was percutaneously coil embolized with multiple overlapping 5 mm, 6 mm, 8 mm and 10 mm diameter soft and fibered interlock coils. The microcatheter was retracted to the level of the common hepatic artery and post embolization common hepatic arteriograms were performed. Images were reviewed and the procedure was terminated. All wires, catheters and sheaths were removed from the patient. Hemostasis was achieved at the right groin access site with deployment of an ExoSeal closure device and manual compression. The patient tolerated the procedure well without immediate post procedural complication. FINDINGS: Selective celiac arteriogram demonstrates conventional takeoff of the GDA however note is made of a prominent pancreaticoduodenal artery which arises from the proximal GDA. No discrete areas contrast extravasation or vessel irregularity are identified angiographically. Given  endoscopic confirmation duodenal bleeding, the decision was made to proceed with prophylactic embolization. Selective pancreaticoduodenal arteriogram confirms the vessel contribute multiple distal tributaries to the descending portion of the duodenum and as such the vessel was successfully percutaneously coil embolized with multiple overlapping coils. Selective gastroduodenal arteriogram demonstrates conventional branching pattern, terminating in a right gastroepiploic artery. The gastroduodenal artery was then successfully percutaneously embolized with multiple overlapping coils to near the origin of the GDA. Completion common hepatic arteriogram demonstrates a technically excellent result with no flow into either the GDA or pancreaticoduodenal arteries. IMPRESSION: Technically successful prophylactic percutaneous coil embolization of the GDA and the pancreaticoduodenal artery for endoscopically confirmed acute upper GI/duodenal bleeding. PLAN: - The patient is to remain flat for  4 hours with right leg straight. - The patient will continue to experience several additional bloody bowel movements and may continue to require additional resuscitation (as she was bleeding both before the procedure and the endoscopy unit), however ultimately I am hopeful she will stabilize in the coming days muted rest of resuscitation - Repeat endoscopy may be performed at the discretion of the GI service as indicated. Electronically Signed   By: Sandi Mariscal M.D.   On: 12/06/2020 13:28   IR Angiogram Selective Each Additional Vessel  Result Date: 12/06/2020 INDICATION: History of chronic intermittent GI bleeding with endoscopy performed earlier today demonstrating acute bleeding at the level of the duodenum. As such, patient presents for mesenteric arteriogram and embolization. EXAM: 1. ULTRASOUND GUIDANCE FOR ARTERIAL ACCESS 2. SELECTIVE CELIAC ARTERIOGRAM 3. SUB SELECTIVE COMMON HEPATIC ARTERIOGRAM 4. SUB SELECTIVE  PANCREATICODUODENAL ARTERIOGRAM AND PERCUTANEOUS COIL EMBOLIZATION 5. SUB SELECTIVE GASTRODUODENAL ARTERIOGRAM AND PERCUTANEOUS COIL EMBOLIZATION COMPARISON:  CT abdomen and pelvis-11/14/2019 MEDICATIONS: None ANESTHESIA/SEDATION: Moderate (conscious) sedation was employed during this procedure. A total of Versed 2 mg and Fentanyl 100 mcg was administered intravenously. Moderate Sedation Time: 65 minutes. The patient's level of consciousness and vital signs were monitored continuously by radiology nursing throughout the procedure under my direct supervision. CONTRAST:  70 cc Omnipaque 350 FLUOROSCOPY TIME:  20 minutes, 30 seconds (6,063 mGy) COMPLICATIONS: None immediate. PROCEDURE: Informed consent was obtained from the patient following explanation of the procedure, risks, benefits and alternatives. All questions were addressed. A time out was performed prior to the initiation of the procedure. Maximal barrier sterile technique utilized including caps, mask, sterile gowns, sterile gloves, large sterile drape, hand hygiene, and Betadine prep. The right femoral head was marked fluoroscopically. Under sterile conditions and local anesthesia, the right common femoral artery access was performed with a micropuncture needle. Under direct ultrasound guidance, the right common femoral was accessed with a micropuncture kit. An ultrasound image was saved for documentation purposes. This allowed for placement of a 5-French vascular sheath. A limited arteriogram was performed through the side arm of the sheath confirming appropriate access within the right common femoral artery. Over a Bentson wire, a Mickelson catheter was advanced the caudal aspect of the thoracic aorta where was reformed, back bled and flushed. The Mickelson catheter was then utilized to select the celiac artery selective celiac arteriogram was performed. Next, with the use of a fathom 16 microwire, a regular Renegade microcatheter was utilized to select  the common hepatic artery and a selective common hepatic arteriogram was performed. The microcatheter was then utilized to select a hypertrophied pancreaticoduodenal artery arising from the proximal aspect of the GDA and a selective pancreaticoduodenal arteriogram was performed. The microcatheter was advanced to its mid/caudal aspect of the pancreaticoduodenal artery and vessel was subsequently percutaneously coil embolized with multiple overlapping 2 mm, 3 mm and 4 mm soft and fibered interlock coils to near the vessel's origin. The microcatheter was then retracted to the level of the gastroduodenal artery and a selective gastroduodenal arteriogram was performed. The microcatheter was advanced to the distal aspect of the GDA, at the takeoff of the right gastroepiploic artery. Next, the GDA was percutaneously coil embolized with multiple overlapping 5 mm, 6 mm, 8 mm and 10 mm diameter soft and fibered interlock coils. The microcatheter was retracted to the level of the common hepatic artery and post embolization common hepatic arteriograms were performed. Images were reviewed and the procedure was terminated. All wires, catheters and sheaths were removed from the patient. Hemostasis  was achieved at the right groin access site with deployment of an ExoSeal closure device and manual compression. The patient tolerated the procedure well without immediate post procedural complication. FINDINGS: Selective celiac arteriogram demonstrates conventional takeoff of the GDA however note is made of a prominent pancreaticoduodenal artery which arises from the proximal GDA. No discrete areas contrast extravasation or vessel irregularity are identified angiographically. Given endoscopic confirmation duodenal bleeding, the decision was made to proceed with prophylactic embolization. Selective pancreaticoduodenal arteriogram confirms the vessel contribute multiple distal tributaries to the descending portion of the duodenum and as  such the vessel was successfully percutaneously coil embolized with multiple overlapping coils. Selective gastroduodenal arteriogram demonstrates conventional branching pattern, terminating in a right gastroepiploic artery. The gastroduodenal artery was then successfully percutaneously embolized with multiple overlapping coils to near the origin of the GDA. Completion common hepatic arteriogram demonstrates a technically excellent result with no flow into either the GDA or pancreaticoduodenal arteries. IMPRESSION: Technically successful prophylactic percutaneous coil embolization of the GDA and the pancreaticoduodenal artery for endoscopically confirmed acute upper GI/duodenal bleeding. PLAN: - The patient is to remain flat for 4 hours with right leg straight. - The patient will continue to experience several additional bloody bowel movements and may continue to require additional resuscitation (as she was bleeding both before the procedure and the endoscopy unit), however ultimately I am hopeful she will stabilize in the coming days muted rest of resuscitation - Repeat endoscopy may be performed at the discretion of the GI service as indicated. Electronically Signed   By: Sandi Mariscal M.D.   On: 12/06/2020 13:28   IR Angiogram Selective Each Additional Vessel  Result Date: 12/06/2020 INDICATION: History of chronic intermittent GI bleeding with endoscopy performed earlier today demonstrating acute bleeding at the level of the duodenum. As such, patient presents for mesenteric arteriogram and embolization. EXAM: 1. ULTRASOUND GUIDANCE FOR ARTERIAL ACCESS 2. SELECTIVE CELIAC ARTERIOGRAM 3. SUB SELECTIVE COMMON HEPATIC ARTERIOGRAM 4. SUB SELECTIVE PANCREATICODUODENAL ARTERIOGRAM AND PERCUTANEOUS COIL EMBOLIZATION 5. SUB SELECTIVE GASTRODUODENAL ARTERIOGRAM AND PERCUTANEOUS COIL EMBOLIZATION COMPARISON:  CT abdomen and pelvis-11/14/2019 MEDICATIONS: None ANESTHESIA/SEDATION: Moderate (conscious) sedation was employed  during this procedure. A total of Versed 2 mg and Fentanyl 100 mcg was administered intravenously. Moderate Sedation Time: 65 minutes. The patient's level of consciousness and vital signs were monitored continuously by radiology nursing throughout the procedure under my direct supervision. CONTRAST:  70 cc Omnipaque 350 FLUOROSCOPY TIME:  20 minutes, 30 seconds (0,093 mGy) COMPLICATIONS: None immediate. PROCEDURE: Informed consent was obtained from the patient following explanation of the procedure, risks, benefits and alternatives. All questions were addressed. A time out was performed prior to the initiation of the procedure. Maximal barrier sterile technique utilized including caps, mask, sterile gowns, sterile gloves, large sterile drape, hand hygiene, and Betadine prep. The right femoral head was marked fluoroscopically. Under sterile conditions and local anesthesia, the right common femoral artery access was performed with a micropuncture needle. Under direct ultrasound guidance, the right common femoral was accessed with a micropuncture kit. An ultrasound image was saved for documentation purposes. This allowed for placement of a 5-French vascular sheath. A limited arteriogram was performed through the side arm of the sheath confirming appropriate access within the right common femoral artery. Over a Bentson wire, a Mickelson catheter was advanced the caudal aspect of the thoracic aorta where was reformed, back bled and flushed. The Mickelson catheter was then utilized to select the celiac artery selective celiac arteriogram was performed. Next, with the use of  a fathom 16 microwire, a regular Renegade microcatheter was utilized to select the common hepatic artery and a selective common hepatic arteriogram was performed. The microcatheter was then utilized to select a hypertrophied pancreaticoduodenal artery arising from the proximal aspect of the GDA and a selective pancreaticoduodenal arteriogram was  performed. The microcatheter was advanced to its mid/caudal aspect of the pancreaticoduodenal artery and vessel was subsequently percutaneously coil embolized with multiple overlapping 2 mm, 3 mm and 4 mm soft and fibered interlock coils to near the vessel's origin. The microcatheter was then retracted to the level of the gastroduodenal artery and a selective gastroduodenal arteriogram was performed. The microcatheter was advanced to the distal aspect of the GDA, at the takeoff of the right gastroepiploic artery. Next, the GDA was percutaneously coil embolized with multiple overlapping 5 mm, 6 mm, 8 mm and 10 mm diameter soft and fibered interlock coils. The microcatheter was retracted to the level of the common hepatic artery and post embolization common hepatic arteriograms were performed. Images were reviewed and the procedure was terminated. All wires, catheters and sheaths were removed from the patient. Hemostasis was achieved at the right groin access site with deployment of an ExoSeal closure device and manual compression. The patient tolerated the procedure well without immediate post procedural complication. FINDINGS: Selective celiac arteriogram demonstrates conventional takeoff of the GDA however note is made of a prominent pancreaticoduodenal artery which arises from the proximal GDA. No discrete areas contrast extravasation or vessel irregularity are identified angiographically. Given endoscopic confirmation duodenal bleeding, the decision was made to proceed with prophylactic embolization. Selective pancreaticoduodenal arteriogram confirms the vessel contribute multiple distal tributaries to the descending portion of the duodenum and as such the vessel was successfully percutaneously coil embolized with multiple overlapping coils. Selective gastroduodenal arteriogram demonstrates conventional branching pattern, terminating in a right gastroepiploic artery. The gastroduodenal artery was then  successfully percutaneously embolized with multiple overlapping coils to near the origin of the GDA. Completion common hepatic arteriogram demonstrates a technically excellent result with no flow into either the GDA or pancreaticoduodenal arteries. IMPRESSION: Technically successful prophylactic percutaneous coil embolization of the GDA and the pancreaticoduodenal artery for endoscopically confirmed acute upper GI/duodenal bleeding. PLAN: - The patient is to remain flat for 4 hours with right leg straight. - The patient will continue to experience several additional bloody bowel movements and may continue to require additional resuscitation (as she was bleeding both before the procedure and the endoscopy unit), however ultimately I am hopeful she will stabilize in the coming days muted rest of resuscitation - Repeat endoscopy may be performed at the discretion of the GI service as indicated. Electronically Signed   By: Sandi Mariscal M.D.   On: 12/06/2020 13:28   [place] [employer] [place]  Result Date: 12/13/2020 INDICATION: 75 year old female presents for treatment of right common femoral artery pseudoaneurysm EXAM: ULTRASOUND-GUIDED NEEDLE PLACEMENT THROMBIN INJECTION OF PSEUDOANEURYSM MEDICATIONS: None ANESTHESIA/SEDATION: No moderate sedation. CONTRAST:  None FLUOROSCOPY TIME:  Ultrasound COMPLICATIONS: None PROCEDURE: Informed consent was obtained from the patient following explanation of the procedure, risks, benefits and alternatives. The patient understands, agrees and consents for the procedure. All questions were addressed. A time out was performed prior to the initiation of the procedure. Maximal barrier sterile technique utilized including caps, mask, sterile gowns, sterile gloves, large sterile drape, hand hygiene, and Betadine prep. Ultrasound images were performed of the right inguinal region, confirming persistence pseudoaneurysm associated with the right common femoral artery. Estimated diameter 17 mm.  The patient was then  prepped and draped in the usual sterile fashion. 1% lidocaine was used for local anesthesia. Bovine thrombin was then reconstituted. A 21 gauge needle was advanced under ultrasound guidance through the dome of the pseudoaneurysm into the mid segment. Once we confirmed needle tip position, embolization was then performed under ultrasound visualization. Because of the rapidity with which the small pseudoaneurysm thrombosed, a needle exchange was made for a 20 gauge needle, confirming no return of flow. Color flow confirmed embolization of the entire aneurysm. Needle was removed and a final image was stored. 2+ palpable pulses at the right DP and PT confirmed at the conclusion. Sterile dressing was placed. Patient tolerated the procedure well and remained hemodynamically stable throughout. No complications were encountered and no significant blood loss. IMPRESSION: Status post treatment of right common femoral artery pseudoaneurysm with ultrasound-guided thrombin injection. Signed, Dulcy Fanny. Dellia Nims, RPVI [facility] Radiology Electronically Signed   By: Corrie Mckusick D.O.   On: 12/13/2020 14:40   IR US Guide Vasc Access Left  Result Date: 12/13/2020 INDICATION: 75 year old female presents for mesenteric angiogram and possible embolization for ongoing upper GI hemorrhage EXAM: ULTRASOUND-GUIDED ACCESS LEFT COMMON FEMORAL ARTERY MESENTERIC ANGIOGRAM COIL EMBOLIZATION OF INFERIOR PANCREATICODUODENAL ARTERIES EXOSEAL FOR HEMOSTASIS MEDICATIONS: None ANESTHESIA/SEDATION: Moderate (conscious) sedation was employed during this procedure. A total of Versed 2.0 mg and Fentanyl 75 mcg was administered intravenously. Moderate Sedation Time: 60 minutes. The patient's level of consciousness and vital signs were monitored continuously by radiology nursing throughout the procedure under my direct supervision. CONTRAST:  90m OMNIPAQUE IOHEXOL 240 MG/ML SOLN,  425mOMNIPAQUE IOHEXOL 240 MG/ML SOLN, 4142mMNIPAQUE IOHEXOL 240 MG/ML SOLN, 45m38mNIPAQUE IOHEXOL 240 MG/ML SOLN FLUOROSCOPY TIME:  Fluoroscopy Time: 8 minutes 24 seconds (13 20 mGy). COMPLICATIONS: None PROCEDURE: Informed consent was obtained from the patient following explanation of the procedure, risks, benefits and alternatives. The patient understands, agrees and consents for the procedure. All questions were addressed. A time out was performed prior to the initiation of the procedure. Maximal barrier sterile technique utilized including caps, mask, sterile gowns, sterile gloves, large sterile drape, hand hygiene, and Betadine prep. Ultrasound survey of the left inguinal region was performed with images stored and sent to PACs, confirming patency of the vessel. A micropuncture needle was used access the left common femoral artery under ultrasound. With excellent arterial blood flow returned, and an .018 micro wire was passed through the needle, observed enter the abdominal aorta under fluoroscopy. The needle was removed, and a micropuncture sheath was placed over the wire. The inner dilator and wire were removed, and an 035 Bentson wire was advanced under fluoroscopy into the abdominal aorta. The sheath was removed and a standard 5 FrenPakistancular sheath was placed. The dilator was removed and the sheath was flushed. C2 cobra catheter was then advanced on the Bentson wire into the abdominal aorta. Cobra catheter was used to select the SMA origin. Angiogram was performed with multiple obliquity and magnification. Glidewire was then used to navigate the base catheter further into the SMA. A high-flow Renegade microcatheter was then advanced on a 16 fathom wire into the targeted arteries for super selective angiogram of the inferior pancreaticoduodenal arteries. Some vaso spasm was created within the targeted arteries, however, angiogram revealed some abnormal mucosal blush without extravasation in the tissues  immediately adjacent to the surgical clips. The high-flow Renegade was then removed, and a 105 cm STC microcatheter was then advanced with the 16 fathom wire. With an  adequate catheter position in the targeted artery, coil embolization was performed with a combination of 2 mm and 3 mm coils to stasis. Microcatheter and microwire were removed, the base catheter was withdrawn, and a final angiogram was performed of the SMA territory. Cobra catheter was then withdrawn into the aorta, Bentson wire was passed, and the catheter was further advanced to above the celiac origin. Catheter was then used to engage the celiac artery origin and a celiac artery angiogram was performed. All catheters wires were then removed. Exoseal was deployed at the left common femoral artery puncture site. Sterile dressing was placed. Patient tolerated the procedure well and remained hemodynamically stable throughout. No complications were encountered and no significant blood loss. IMPRESSION: Status post ultrasound-guided access left common femoral artery for mesenteric angiogram and super selective coil embolization of the inferior pancreaticoduodenal arteries, contributing to abnormal mucosal blush at the site of the surgical clips in the duodenum. Exoseal for hemostasis. Signed, Dulcy Fanny. Dellia Nims, RPVI [facility] Radiology Electronically Signed   By: Corrie Mckusick D.O.   On: 12/13/2020 12:53   IR US Guide Vasc Access Right  Result Date: 12/06/2020 INDICATION: History of chronic intermittent GI bleeding with endoscopy performed earlier today demonstrating acute bleeding at the level of the duodenum. As such, patient presents for mesenteric arteriogram and embolization. EXAM: 1. ULTRASOUND GUIDANCE FOR ARTERIAL ACCESS 2. SELECTIVE CELIAC ARTERIOGRAM 3. SUB SELECTIVE COMMON HEPATIC ARTERIOGRAM 4. SUB SELECTIVE PANCREATICODUODENAL ARTERIOGRAM AND PERCUTANEOUS COIL EMBOLIZATION 5. SUB  SELECTIVE GASTRODUODENAL ARTERIOGRAM AND PERCUTANEOUS COIL EMBOLIZATION COMPARISON:  CT abdomen and pelvis-11/14/2019 MEDICATIONS: None ANESTHESIA/SEDATION: Moderate (conscious) sedation was employed during this procedure. A total of Versed 2 mg and Fentanyl 100 mcg was administered intravenously. Moderate Sedation Time: 65 minutes. The patient's level of consciousness and vital signs were monitored continuously by radiology nursing throughout the procedure under my direct supervision. CONTRAST:  70 cc Omnipaque 350 FLUOROSCOPY TIME:  20 minutes, 30 seconds (5,638 mGy) COMPLICATIONS: None immediate. PROCEDURE: Informed consent was obtained from the patient following explanation of the procedure, risks, benefits and alternatives. All questions were addressed. A time out was performed prior to the initiation of the procedure. Maximal barrier sterile technique utilized including caps, mask, sterile gowns, sterile gloves, large sterile drape, hand hygiene, and Betadine prep. The right femoral head was marked fluoroscopically. Under sterile conditions and local anesthesia, the right common femoral artery access was performed with a micropuncture needle. Under direct ultrasound guidance, the right common femoral was accessed with a micropuncture kit. An ultrasound image was saved for documentation purposes. This allowed for placement of a 5-French vascular sheath. A limited arteriogram was performed through the side arm of the sheath confirming appropriate access within the right common femoral artery. Over a Bentson wire, a Mickelson catheter was advanced the caudal aspect of the thoracic aorta where was reformed, back bled and flushed. The Mickelson catheter was then utilized to select the celiac artery selective celiac arteriogram was performed. Next, with the use of a fathom 16 microwire, a regular Renegade microcatheter was utilized to select the common hepatic artery and a selective common hepatic arteriogram was  performed. The microcatheter was then utilized to select a hypertrophied pancreaticoduodenal artery arising from the proximal aspect of the GDA and a selective pancreaticoduodenal arteriogram was performed. The microcatheter was advanced to its mid/caudal aspect of the pancreaticoduodenal artery and vessel was subsequently percutaneously coil embolized with multiple overlapping 2 mm, 3 mm and 4 mm soft and fibered interlock  coils to near the vessel's origin. The microcatheter was then retracted to the level of the gastroduodenal artery and a selective gastroduodenal arteriogram was performed. The microcatheter was advanced to the distal aspect of the GDA, at the takeoff of the right gastroepiploic artery. Next, the GDA was percutaneously coil embolized with multiple overlapping 5 mm, 6 mm, 8 mm and 10 mm diameter soft and fibered interlock coils. The microcatheter was retracted to the level of the common hepatic artery and post embolization common hepatic arteriograms were performed. Images were reviewed and the procedure was terminated. All wires, catheters and sheaths were removed from the patient. Hemostasis was achieved at the right groin access site with deployment of an ExoSeal closure device and manual compression. The patient tolerated the procedure well without immediate post procedural complication. FINDINGS: Selective celiac arteriogram demonstrates conventional takeoff of the GDA however note is made of a prominent pancreaticoduodenal artery which arises from the proximal GDA. No discrete areas contrast extravasation or vessel irregularity are identified angiographically. Given endoscopic confirmation duodenal bleeding, the decision was made to proceed with prophylactic embolization. Selective pancreaticoduodenal arteriogram confirms the vessel contribute multiple distal tributaries to the descending portion of the duodenum and as such the vessel was successfully percutaneously coil embolized with  multiple overlapping coils. Selective gastroduodenal arteriogram demonstrates conventional branching pattern, terminating in a right gastroepiploic artery. The gastroduodenal artery was then successfully percutaneously embolized with multiple overlapping coils to near the origin of the GDA. Completion common hepatic arteriogram demonstrates a technically excellent result with no flow into either the GDA or pancreaticoduodenal arteries. IMPRESSION: Technically successful prophylactic percutaneous coil embolization of the GDA and the pancreaticoduodenal artery for endoscopically confirmed acute upper GI/duodenal bleeding. PLAN: - The patient is to remain flat for 4 hours with right leg straight. - The patient will continue to experience several additional bloody bowel movements and may continue to require additional resuscitation (as she was bleeding both before the procedure and the endoscopy unit), however ultimately I am hopeful she will stabilize in the coming days muted rest of resuscitation - Repeat endoscopy may be performed at the discretion of the GI service as indicated. Electronically Signed   By: Sandi Mariscal M.D.   On: 12/06/2020 13:28   DG Chest Port 1 View  Result Date: 12/10/2020 CLINICAL DATA:  Shortness of breath and weakness over the last 4 days EXAM: PORTABLE CHEST 1 VIEW COMPARISON:  12/04/2020 FINDINGS: Stable mild to moderate enlargement of the cardiopericardial silhouette, without edema. Atherosclerotic calcification of the aortic arch. Thoracic spondylosis. No blunting of the costophrenic angles. Mild degenerative glenohumeral arthropathy bilaterally. IMPRESSION: 1. Stable mild to moderate enlargement of the cardiopericardial silhouette, without edema. 2.  Aortic Atherosclerosis (ICD10-I70.0). 3. Thoracic spondylosis. Electronically Signed   By: Van Clines M.D.   On: 12/10/2020 12:06   DG Chest Port 1 View  Result Date: 12/04/2020 CLINICAL DATA:  Shortness of breath EXAM:  PORTABLE CHEST 1 VIEW COMPARISON:  Chest x-ray dated October 19, 2020 FINDINGS: Unchanged cardiomegaly. Mediastinal contours are within normal limits. Clear lungs. No pleural effusion or pneumothorax. IMPRESSION: No active disease. Electronically Signed   By: Yetta Glassman M.D.   On: 12/04/2020 14:02   VAS [place] GROIN PSEUDOANEURYSM  Result Date: 12/14/2020  ARTERIAL PSEUDOANEURYSM  Patient Name:  DELEAH TISON  Date of Exam:   12/14/2020 Medical Rec #: [id number]          Accession #:    [id number] Date of Birth: 06-Apr-1945  Patient Gender: F Patient Age:   20 years Exam Location:  [facility] Procedure:      VAS [place] GROIN PSEUDOANEURYSM Referring Phys: Soyla Dryer --------------------------------------------------------------------------------  Exam: Right groin Indications: Patient complains of s/p right femoral artery thrombin injection. History: S/p catheterization. Comparison Study: No prior study Performing Technologist: Maudry Mayhew MHA, RDMS, RVT, RDCS  Examination Guidelines: A complete evaluation includes B-mode imaging, spectral Doppler, color Doppler, and power Doppler as needed of all accessible portions of each vessel. Bilateral testing is considered an integral part of a complete examination. Limited examinations for reoccurring indications may be performed as noted. +------------+----------+---------+------+----------+ Right DuplexPSV (cm/s)Waveform PlaqueComment(s) +------------+----------+---------+------+----------+ CFA            114    triphasic                 +------------+----------+---------+------+----------+ PFA            119    biphasic                  +------------+----------+---------+------+----------+ Prox SFA       124    triphasic                 +------------+----------+---------+------+----------+ Right Vein comments:CFV patent and compressible  Findings: A hypoechoic structure measuring approximately 1.5 cm x 1.7 cm is  visualized at the right groin with ultrasound characteristics of a hematoma. This is suggestive of sucecssfulrombin injection.  Diagnosing physician: Jamelle Haring Electronically signed by Jamelle Haring on 12/14/2020 at 6:59:37 PM.   --------------------------------------------------------------------------------    Final    ECHOCARDIOGRAM COMPLETE  Result Date: 11/20/2020    ECHOCARDIOGRAM REPORT   Patient Name:   LIZABETH FELLNER Date of Exam: 11/20/2020 Medical Rec #:  [id number]         Height:       61.0 in Accession #:    [id number]        Weight:       199.0 lb Date of Birth:  June 30, 1945         BSA:          1.885 m Patient Age:    75 years          BP:           123/49 mmHg Patient Gender: F                 HR:           51 bpm. Exam Location:  [place] Procedure: 2D Echo, Cardiac Doppler, Color Doppler and Intracardiac            Opacification Agent Indications:    I42.1 HOCM  History:        Patient has prior history of Echocardiogram examinations, most                 recent 03/24/2019. Risk Factors:Dyslipidemia and Hypertension.                 HOCM. Murmur.  Sonographer:    Wilford Sports Rodgers-Jones RDCS Referring Phys: [place]  1. Hypertrophic cardiomyopathy. Systolic anterior motion of the mitral valve is noted. Significant intracavitary gradient noted. Peak velocity 5.1 m/s. Peak gradient 104.2 mmHg. Left ventricular ejection fraction, by estimation, is >75%. The left ventricle has hyperdynamic function. The left ventricle has no regional wall motion abnormalities. There is severe concentric left ventricular hypertrophy. Left ventricular diastolic parameters are consistent with Grade I diastolic dysfunction (impaired relaxation).  Elevated left ventricular end-diastolic pressure.  2. Right ventricular systolic function is normal. The right ventricular size is normal.  3. Left atrial size was severely dilated.  4. The mitral valve is normal in structure. Mild mitral  valve regurgitation. No evidence of mitral stenosis.  5. The aortic valve is calcified. There is mild calcification of the aortic valve. There is mild thickening of the aortic valve. Aortic valve regurgitation is not visualized. Mild aortic valve stenosis. Aortic valve mean gradient measures 14.2 mmHg. Aortic valve Vmax measures 2.59 m/s.  6. Aortic dilatation noted. There is mild dilatation of the ascending aorta, measuring 37 mm.  7. The inferior vena cava is normal in size with greater than 50% respiratory variability, suggesting right atrial pressure of 3 mmHg. Conclusion(s)/Recommendation(s): Findings consistent with hypertrophic cardiomyopathy. FINDINGS  Left Ventricle: Hypertrophic cardiomyopathy. Systolic anterior motion of the mitral valve is noted. Significant intracavitary gradient noted. Peak velocity 5.1 m/s. Peak gradient 104.2 mmHg. Left ventricular ejection fraction, by estimation, is >75%. The left ventricle has hyperdynamic function. The left ventricle has no regional wall motion abnormalities. Definity contrast agent was given IV to delineate the left ventricular endocardial borders. The left ventricular internal cavity size was normal in size. There is severe concentric left ventricular hypertrophy. Left ventricular diastolic parameters are consistent with Grade I diastolic dysfunction (impaired relaxation). Elevated left ventricular end-diastolic pressure. Right Ventricle: The right ventricular size is normal. No increase in right ventricular wall thickness. Right ventricular systolic function is normal. Left Atrium: Left atrial size was severely dilated. Right Atrium: Right atrial size was normal in size. Pericardium: Trivial pericardial effusion is present. Mitral Valve: The mitral valve is normal in structure. Mild mitral valve regurgitation. No evidence of mitral valve stenosis. Tricuspid Valve: The tricuspid valve is normal in structure. Tricuspid valve regurgitation is trivial. No evidence  of tricuspid stenosis. Aortic Valve: The aortic valve is calcified. There is mild calcification of the aortic valve. There is mild thickening of the aortic valve. Aortic valve regurgitation is not visualized. Mild aortic stenosis is present. Aortic valve mean gradient measures  14.2 mmHg. Aortic valve peak gradient measures 26.9 mmHg. Pulmonic Valve: The pulmonic valve was normal in structure. Pulmonic valve regurgitation is not visualized. No evidence of pulmonic stenosis. Aorta: Aortic dilatation noted. There is mild dilatation of the ascending aorta, measuring 37 mm. Venous: The inferior vena cava is normal in size with greater than 50% respiratory variability, suggesting right atrial pressure of 3 mmHg. IAS/Shunts: No atrial level shunt detected by color flow Doppler.  LEFT VENTRICLE PLAX 2D LVIDd:         3.60 cm  Diastology LVIDs:         1.30 cm  LV e' medial:    4.03 cm/s LV PW:         2.30 cm  LV E/e' medial:  22.8 LV IVS:        1.90 cm  LV e' lateral:   6.31 cm/s LVOT diam:     2.10 cm  LV E/e' lateral: 14.6 LVOT Area:     3.46 cm  RIGHT VENTRICLE             IVC RV Basal diam:  4.10 cm     IVC diam: 1.30 cm RV S prime:     15.20 cm/s TAPSE (M-mode): 3.7 cm LEFT ATRIUM             Index       RIGHT ATRIUM  Index LA diam:        4.20 cm 2.23 cm/m  RA Area:     11.50 cm LA Vol (A2C):   96.2 ml 51.04 ml/m RA Volume:   27.70 ml  14.70 ml/m LA Vol (A4C):   74.0 ml 39.26 ml/m LA Biplane Vol: 90.4 ml 47.96 ml/m  AORTIC VALVE AV Vmax:      259.26 cm/s AV Vmean:     167.087 cm/s AV VTI:       0.564 m AV Peak Grad: 26.9 mmHg AV Mean Grad: 14.2 mmHg  AORTA Ao Root diam: 3.50 cm Ao Asc diam:  3.70 cm MITRAL VALVE MV Area (PHT): 2.34 cm     SHUNTS MV Decel Time: 324 msec     Systemic Diam: 2.10 cm MV E velocity: 91.90 cm/s MV A velocity: 121.00 cm/s MV E/A ratio:  0.76 Skeet Latch MD Electronically signed by Skeet Latch MD Signature Date/Time: 11/20/2020/3:23:04 PM    Final    IR EMBO ART   VEN HEMORR LYMPH EXTRAV  INC GUIDE ROADMAPPING  Result Date: 12/14/2020 INDICATION: 75 year old female presents for mesenteric angiogram and possible embolization for ongoing upper GI hemorrhage EXAM: ULTRASOUND-GUIDED ACCESS LEFT COMMON FEMORAL ARTERY MESENTERIC ANGIOGRAM COIL EMBOLIZATION OF INFERIOR PANCREATICODUODENAL ARTERIES EXOSEAL FOR HEMOSTASIS MEDICATIONS: None ANESTHESIA/SEDATION: Moderate (conscious) sedation was employed during this procedure. A total of Versed 2.0 mg and Fentanyl 75 mcg was administered intravenously. Moderate Sedation Time: 60 minutes. The patient's level of consciousness and vital signs were monitored continuously by radiology nursing throughout the procedure under my direct supervision. CONTRAST:  84m OMNIPAQUE IOHEXOL 240 MG/ML SOLN, 468mOMNIPAQUE IOHEXOL 240 MG/ML SOLN, 4174mMNIPAQUE IOHEXOL 240 MG/ML SOLN, 19m39mNIPAQUE IOHEXOL 240 MG/ML SOLN FLUOROSCOPY TIME:  Fluoroscopy Time: 8 minutes 24 seconds (13 20 mGy). COMPLICATIONS: None PROCEDURE: Informed consent was obtained from the patient following explanation of the procedure, risks, benefits and alternatives. The patient understands, agrees and consents for the procedure. All questions were addressed. A time out was performed prior to the initiation of the procedure. Maximal barrier sterile technique utilized including caps, mask, sterile gowns, sterile gloves, large sterile drape, hand hygiene, and Betadine prep. Ultrasound survey of the left inguinal region was performed with images stored and sent to PACs, confirming patency of the vessel. A micropuncture needle was used access the left common femoral artery under ultrasound. With excellent arterial blood flow returned, and an .018 micro wire was passed through the needle, observed enter the abdominal aorta under fluoroscopy. The needle was removed, and a micropuncture sheath was placed over the wire. The inner dilator and wire were removed, and an 035 Bentson wire was  advanced under fluoroscopy into the abdominal aorta. The sheath was removed and a standard 5 FrenPakistancular sheath was placed. The dilator was removed and the sheath was flushed. C2 cobra catheter was then advanced on the Bentson wire into the abdominal aorta. Cobra catheter was used to select the SMA origin. Angiogram was performed with multiple obliquity and magnification. Glidewire was then used to navigate the base catheter further into the SMA. A high-flow Renegade microcatheter was then advanced on a 16 fathom wire into the targeted arteries for super selective angiogram of the inferior pancreaticoduodenal arteries. Some vaso spasm was created within the targeted arteries, however, angiogram revealed some abnormal mucosal blush without extravasation in the tissues immediately adjacent to the surgical clips. The high-flow Renegade was then removed, and a 105 cm STC microcatheter was then advanced with the 16 fathom  wire. With an adequate catheter position in the targeted artery, coil embolization was performed with a combination of 2 mm and 3 mm coils to stasis. Microcatheter and microwire were removed, the base catheter was withdrawn, and a final angiogram was performed of the SMA territory. Cobra catheter was then withdrawn into the aorta, Bentson wire was passed, and the catheter was further advanced to above the celiac origin. Catheter was then used to engage the celiac artery origin and a celiac artery angiogram was performed. All catheters wires were then removed. Exoseal was deployed at the left common femoral artery puncture site. Sterile dressing was placed. Patient tolerated the procedure well and remained hemodynamically stable throughout. No complications were encountered and no significant blood loss. IMPRESSION: Status post ultrasound-guided access left common femoral artery for mesenteric angiogram and super selective coil embolization of the inferior pancreaticoduodenal arteries, contributing  to abnormal mucosal blush at the site of the surgical clips in the duodenum. Exoseal for hemostasis. Signed, Dulcy Fanny. Dellia Nims, RPVI [facility] Electronically Signed   By: Corrie Mckusick D.O.   On: 12/13/2020 12:53   IR EMBO ART  VEN HEMORR LYMPH EXTRAV  INC GUIDE ROADMAPPING  Result Date: 12/06/2020 INDICATION: History of chronic intermittent GI bleeding with endoscopy performed earlier today demonstrating acute bleeding at the level of the duodenum. As such, patient presents for mesenteric arteriogram and embolization. EXAM: 1. ULTRASOUND GUIDANCE FOR ARTERIAL ACCESS 2. SELECTIVE CELIAC ARTERIOGRAM 3. SUB SELECTIVE COMMON HEPATIC ARTERIOGRAM 4. SUB SELECTIVE PANCREATICODUODENAL ARTERIOGRAM AND PERCUTANEOUS COIL EMBOLIZATION 5. SUB SELECTIVE GASTRODUODENAL ARTERIOGRAM AND PERCUTANEOUS COIL EMBOLIZATION COMPARISON:  CT abdomen and pelvis-11/14/2019 MEDICATIONS: None ANESTHESIA/SEDATION: Moderate (conscious) sedation was employed during this procedure. A total of Versed 2 mg and Fentanyl 100 mcg was administered intravenously. Moderate Sedation Time: 65 minutes. The patient's level of consciousness and vital signs were monitored continuously by radiology nursing throughout the procedure under my direct supervision. CONTRAST:  70 cc Omnipaque 350 FLUOROSCOPY TIME:  20 minutes, 30 seconds (2,563 mGy) COMPLICATIONS: None immediate. PROCEDURE: Informed consent was obtained from the patient following explanation of the procedure, risks, benefits and alternatives. All questions were addressed. A time out was performed prior to the initiation of the procedure. Maximal barrier sterile technique utilized including caps, mask, sterile gowns, sterile gloves, large sterile drape, hand hygiene, and Betadine prep. The right femoral head was marked fluoroscopically. Under sterile conditions and local anesthesia, the right common femoral artery access was performed  with a micropuncture needle. Under direct ultrasound guidance, the right common femoral was accessed with a micropuncture kit. An ultrasound image was saved for documentation purposes. This allowed for placement of a 5-French vascular sheath. A limited arteriogram was performed through the side arm of the sheath confirming appropriate access within the right common femoral artery. Over a Bentson wire, a Mickelson catheter was advanced the caudal aspect of the thoracic aorta where was reformed, back bled and flushed. The Mickelson catheter was then utilized to select the celiac artery selective celiac arteriogram was performed. Next, with the use of a fathom 16 microwire, a regular Renegade microcatheter was utilized to select the common hepatic artery and a selective common hepatic arteriogram was performed. The microcatheter was then utilized to select a hypertrophied pancreaticoduodenal artery arising from the proximal aspect of the GDA and a selective pancreaticoduodenal arteriogram was performed. The microcatheter was advanced to its mid/caudal aspect of the pancreaticoduodenal artery and vessel was subsequently percutaneously coil embolized with multiple overlapping 2 mm,  3 mm and 4 mm soft and fibered interlock coils to near the vessel's origin. The microcatheter was then retracted to the level of the gastroduodenal artery and a selective gastroduodenal arteriogram was performed. The microcatheter was advanced to the distal aspect of the GDA, at the takeoff of the right gastroepiploic artery. Next, the GDA was percutaneously coil embolized with multiple overlapping 5 mm, 6 mm, 8 mm and 10 mm diameter soft and fibered interlock coils. The microcatheter was retracted to the level of the common hepatic artery and post embolization common hepatic arteriograms were performed. Images were reviewed and the procedure was terminated. All wires, catheters and sheaths were removed from the patient. Hemostasis was  achieved at the right groin access site with deployment of an ExoSeal closure device and manual compression. The patient tolerated the procedure well without immediate post procedural complication. FINDINGS: Selective celiac arteriogram demonstrates conventional takeoff of the GDA however note is made of a prominent pancreaticoduodenal artery which arises from the proximal GDA. No discrete areas contrast extravasation or vessel irregularity are identified angiographically. Given endoscopic confirmation duodenal bleeding, the decision was made to proceed with prophylactic embolization. Selective pancreaticoduodenal arteriogram confirms the vessel contribute multiple distal tributaries to the descending portion of the duodenum and as such the vessel was successfully percutaneously coil embolized with multiple overlapping coils. Selective gastroduodenal arteriogram demonstrates conventional branching pattern, terminating in a right gastroepiploic artery. The gastroduodenal artery was then successfully percutaneously embolized with multiple overlapping coils to near the origin of the GDA. Completion common hepatic arteriogram demonstrates a technically excellent result with no flow into either the GDA or pancreaticoduodenal arteries. IMPRESSION: Technically successful prophylactic percutaneous coil embolization of the GDA and the pancreaticoduodenal artery for endoscopically confirmed acute upper GI/duodenal bleeding. PLAN: - The patient is to remain flat for 4 hours with right leg straight. - The patient will continue to experience several additional bloody bowel movements and may continue to require additional resuscitation (as she was bleeding both before the procedure and the endoscopy unit), however ultimately I am hopeful she will stabilize in the coming days muted rest of resuscitation - Repeat endoscopy may be performed at the discretion of the GI service as indicated. Electronically Signed   By: Sandi Mariscal  M.D.   On: 12/06/2020 13:28   CT Angio Abd/Pel W and/or Wo Contrast  Result Date: 12/10/2020 CLINICAL DATA:  GI bleed, weakness EXAM: CTA ABDOMEN AND PELVIS WITHOUT AND WITH CONTRAST TECHNIQUE: Multidetector CT imaging of the abdomen and pelvis was performed using the standard protocol during bolus administration of intravenous contrast. Multiplanar reconstructed images and MIPs were obtained and reviewed to evaluate the vascular anatomy. CONTRAST:  158m OMNIPAQUE IOHEXOL 350 MG/ML SOLN COMPARISON:  CT abdomen pelvis, 11/14/2019, interventional angiogram, 12/05/2020 FINDINGS: VASCULAR Normal contour and caliber of the abdominal aorta. No evidence of aortic aneurysm, dissection, or other acute aortic pathology. Moderate mixed calcific atherosclerosis. Standard branching pattern of the abdominal aorta, with solitary bilateral renal arteries. The branch vessel origins are patent. Coil material within the pancreaticoduodenal and gastroduodenal arteries. 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 Review of the MIP images confirms the above findings. NON-VASCULAR Lower chest: No acute abnormality.  Coronary artery calcifications. Hepatobiliary: No solid liver abnormality is seen. No gallstones, gallbladder wall thickening, or biliary dilatation. Pancreas: Unremarkable. No pancreatic ductal dilatation or surrounding inflammatory changes. Spleen: Normal in size without significant  abnormality. Adrenals/Urinary Tract: Adrenal glands are unremarkable. Kidneys are normal, without renal calculi, solid lesion, or hydronephrosis. Bladder is unremarkable. Stomach/Bowel: Stomach is within normal limits. Appendix appears normal. No evidence of bowel wall thickening, distention, or inflammatory changes. Lymphatic: No enlarged abdominal or pelvic lymph nodes. Reproductive: No mass or other significant  abnormality. Other: No abdominal wall hernia or abnormality. No abdominopelvic ascites. Musculoskeletal: No acute or significant osseous findings. IMPRESSION: 1. Normal contour and caliber of the abdominal aorta. No evidence of aortic aneurysm, dissection, or other acute aortic pathology. Moderate mixed calcific atherosclerosis. 2. 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 3. No contrast extravasation within the bowel lumen. 4. Coil material within the pancreaticoduodenal and gastroduodenal arteries, per recent coiling procedure. 5. Coronary artery disease. Aortic Atherosclerosis (ICD10-I70.0). Electronically Signed   By: Eddie Candle M.D.   On: 12/10/2020 13:25     Procedures: EGD . Pancreato duodenal artery coil embolization Thrombin to right CFA   Subjective: Patient is feeling better, no nausea or vomiting, no dyspnea or chest pain.   Discharge Exam: Vitals:   12/15/20 0700 12/15/20 0810  BP:  98/62  Pulse:    Resp:    Temp: 98.3 F (36.8 C)   SpO2:     Vitals:   12/14/20 2357 12/15/20 0339 12/15/20 0700 12/15/20 0810  BP:    98/62  Pulse:      Resp:      Temp: 97.9 F (36.6 C) 98 F (36.7 C) 98.3 F (36.8 C)   TempSrc: Oral Oral Oral   SpO2:      Weight:      Height:        General: Not in pain or dyspnea Neurology: Awake and alert, non focal  E ENT: mild pallor, no icterus, oral mucosa moist Cardiovascular: No JVD. S1-S2 present, rhythmic, no gallops, rubs, or murmurs. No lower extremity edema. Pulmonary: positive breath sounds bilaterally, adequate air movement, no wheezing, rhonchi or rales. Gastrointestinal. Abdomen soft and non tender Skin. No rashes Musculoskeletal: no joint deformities   The results of significant diagnostics from this hospitalization (including imaging, microbiology, ancillary and laboratory) are listed below for reference.      Microbiology: Recent Results (from the past 240 hour(s))  MRSA Next Gen by PCR, Nasal     Status: None   Collection Time: 12/05/20  8:37 PM   Specimen: Nasal Mucosa; Nasal Swab  Result Value Ref Range Status   MRSA by PCR Next Gen NOT DETECTED NOT DETECTED Final    Comment: (NOTE) The GeneXpert MRSA Assay (FDA approved for NASAL specimens only), is one component of a comprehensive MRSA colonization surveillance program. It is not intended to diagnose MRSA infection nor to guide or monitor treatment for MRSA infections. Test performance is not FDA approved in patients less than 45 years old. Performed at [facility], [address], [place], [place] [address]      Labs: BNP (last 3 results) No results for input(s): BNP in the last 8760 hours. Basic Metabolic Panel: Recent Labs  Lab 12/10/20 1120 12/11/20 0627 12/11/20 1206 12/13/20 0115 12/14/20 0137  NA 136 137 138 136 136  K 3.9 3.9 4.1 3.4* 3.6  CL 106 108 107 107 105  CO2 25 27  --  23 24  GLUCOSE 119* 103* 100* 99 87  BUN _0 CREATININE 0.61 0.62 0.50 0.70 0.72  CALCIUM 7.9* 7.8*  --  8.0* 8.2*  MG  --   --   --  2.0 2.0   Liver Function Tests: Recent Labs  Lab 12/10/20 1120  AST 20  ALT 14  ALKPHOS 22*  BILITOT 0.6  PROT 5.6*  ALBUMIN 3.1*   No results for input(s): LIPASE, AMYLASE in the last 168 hours. No results for input(s): AMMONIA in the last 168 hours. CBC: Recent Labs  Lab 12/10/20 1120 12/10/20 2040 12/11/20 1407 12/11/20 2200 12/12/20 0814 12/12/20 1718 12/13/20 0115 12/14/20 0137 12/15/20 0033  WBC 8.7   < > 8.7 8.1 6.1  --  5.9 6.3  --   NEUTROABS 6.0  --   --   --   --   --   --   --   --   HGB 5.6*   < > 8.7* 8.5* 8.3* 8.5* 7.8* 7.5* 8.0*  HCT 18.0*   < > 27.1* 25.8* 25.6* 26.1* 24.7* 23.4* 24.1*  MCV 99.4   < > 94.4 93.5 94.8  --  93.9 93.2  --   PLT 238   < > 187 184 203  --  210 227  --    < > = values in this interval not displayed.   Cardiac Enzymes: No  results for input(s): CKTOTAL, CKMB, CKMBINDEX, TROPONINI in the last 168 hours. BNP: Invalid input(s): POCBNP CBG: Recent Labs  Lab 12/12/20 1722  GLUCAP 83   D-Dimer No results for input(s): DDIMER in the last 72 hours. Hgb A1c No results for input(s): HGBA1C in the last 72 hours. Lipid Profile No results for input(s): CHOL, HDL, LDLCALC, TRIG, CHOLHDL, LDLDIRECT in the last 72 hours. Thyroid function studies No results for input(s): TSH, T4TOTAL, T3FREE, THYROIDAB in the last 72 hours.  Invalid input(s): FREET3 Anemia work up No results for input(s): VITAMINB12, FOLATE, FERRITIN, TIBC, IRON, RETICCTPCT in the last 72 hours. Urinalysis    Component Value Date/Time   COLORURINE YELLOW 11/14/2019 0851   APPEARANCEUR Clear 10/19/2020 1215   LABSPEC 1.015 11/14/2019 0851   PHURINE 8.0 11/14/2019 0851   GLUCOSEU Negative 10/19/2020 1215   HGBUR NEGATIVE 11/14/2019 0851   BILIRUBINUR Negative 10/19/2020 1215   KETONESUR NEGATIVE 11/14/2019 0851   PROTEINUR Negative 10/19/2020 1215   PROTEINUR NEGATIVE 11/14/2019 0851   UROBILINOGEN negative 02/24/2015 1344   NITRITE Negative 10/19/2020 1215   NITRITE NEGATIVE 11/14/2019 0851   LEUKOCYTESUR Negative 10/19/2020 1215   LEUKOCYTESUR TRACE (A) 11/14/2019 0851   Sepsis Labs Invalid input(s): PROCALCITONIN,  WBC,  LACTICIDVEN Microbiology Recent Results (from the past 240 hour(s))  MRSA Next Gen by PCR, Nasal     Status: None   Collection Time: 12/05/20  8:37 PM   Specimen: Nasal Mucosa; Nasal Swab  Result Value Ref Range Status   MRSA by PCR Next Gen NOT DETECTED NOT DETECTED Final    Comment: (NOTE) The GeneXpert MRSA Assay (FDA approved for NASAL specimens only), is one component of a comprehensive MRSA colonization surveillance program. It is not intended to diagnose MRSA infection nor to guide or monitor treatment for MRSA infections. Test performance is not FDA approved in patients less than 94 years old. Performed  at [facility], [address], [place], [place] [address]      Time coordinating discharge: 45 minutes  SIGNED:   Tawni Millers, MD  [facility] 12/15/2020, 10:07 AM

## 2020-12-15 NOTE — Progress Notes (Signed)
PT Cancellation Note  Patient Details Name: Stacey Reyes MRN: [id number] DOB: 1945-06-28   Cancelled Treatment:    Reason Eval/Treat Not Completed: PT screened, no needs identified, will sign off (Per OT pt walking unit, at baseline, no needs and ready to return home.)   Chalsea Darko B Leidy Massar 12/15/2020, 9:28 AM [place] Pager: [phone] Office: [phone]

## 2020-12-15 NOTE — Evaluation (Signed)
Occupational Therapy Evaluation Patient Details Name: Stacey Reyes MRN: [id number] DOB: Dec 11, 1945 Today's Date: 12/15/2020   History of Present Illness 75 y.o. female admitted to [facility] 9/18 with acute blood loss anemia. 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 9/21 US guided coil embolization of duodenal artery.  PMH: recent [facility] admission 12/04/2020 - 12/07/2020 with acute on chronic symptomatic anemia, GI bleed, HTN, HLD, osteopenia, and hypertrophic cardiomyopathy   Clinical Impression   Patient admitted to [facility] from an outside facility.  PTA she lives with her spouse, who can provide supportive services as needed.  She walks without an AD, and needs no assist with ADL/IADL, and continues to drive.  No real deficits noted.  She did have mild SOB with mobility, but O2 sats 97% on RA.  HGB is still low, so OT educated on symptomatic dizziness.  Patient is essentially at her baseline, no acute of post acute rehab needs identified.  Patient verbalizes understanding of all teachings.        Recommendations for follow up therapy are one component of a multi-disciplinary discharge planning process, led by the attending physician.  Recommendations may be updated based on patient status, additional functional criteria and insurance authorization.   Follow Up Recommendations  No OT follow up    Equipment Recommendations  None recommended by OT    Recommendations for Other Services       Precautions / Restrictions Precautions Precautions: None Restrictions Weight Bearing Restrictions: No      Mobility Bed Mobility Overal bed mobility: Independent                  Transfers Overall transfer level: Independent               General transfer comment: walking the unit ad lib    Balance Overall balance assessment: No apparent balance deficits (not formally assessed)                                         ADL either performed or  assessed with clinical judgement   ADL Overall ADL's : At baseline                                             Vision Patient Visual Report: No change from baseline       Perception     Praxis      Pertinent Vitals/Pain Pain Assessment: No/denies pain     Hand Dominance Right   Extremity/Trunk Assessment Upper Extremity Assessment Upper Extremity Assessment: Overall WFL for tasks assessed   Lower Extremity Assessment Lower Extremity Assessment: Overall WFL for tasks assessed   Cervical / Trunk Assessment Cervical / Trunk Assessment: Normal   Communication Communication Communication: No difficulties   Cognition Arousal/Alertness: Awake/alert Behavior During Therapy: WFL for tasks assessed/performed Overall Cognitive Status: Within Functional Limits for tasks assessed                                     General Comments   HR 101 with mobility.  No dizziness noted.      Exercises     Shoulder Instructions      Home Living  Family/patient expects to be discharged to:: Private residence Living Arrangements: Spouse/significant other Available Help at Discharge: Family;Available 24 hours/day Type of Home: House Home Access: Stairs to enter [employer] of Steps: 1 Entrance Stairs-Rails: None Home Layout: One level     Bathroom Shower/Tub: Teacher, early years/pre: Standard Bathroom Accessibility: Yes   Home Equipment: Environmental consultant - 2 wheels;Grab bars - tub/shower          Prior Functioning/Environment Level of Independence: Independent                 OT Problem List: Decreased activity tolerance      OT Treatment/Interventions:      OT Goals(Current goals can be found in the care plan section) Acute Rehab OT Goals Patient Stated Goal: return home OT Goal Formulation: With patient Time For Goal Achievement: 12/15/20 Potential to Achieve Goals: Good  OT Frequency:     Barriers to D/C:   None          Co-evaluation              AM-PAC OT "6 Clicks" Daily Activity     Outcome Measure Help from another person eating meals?: None Help from another person taking care of personal grooming?: None Help from another person toileting, which includes using toliet, bedpan, or urinal?: None Help from another person bathing (including washing, rinsing, drying)?: None Help from another person to put on and taking off regular upper body clothing?: None Help from another person to put on and taking off regular lower body clothing?: None 6 Click Score: 24   End of Session Nurse Communication: Mobility status  Activity Tolerance: Patient tolerated treatment well Patient left: in chair;with call bell/phone within reach  OT Visit Diagnosis: Dizziness and giddiness (R42)                Time: 0511-0211 OT Time Calculation (min): 16 min Charges:  OT General Charges $OT Visit: 1 Visit OT Evaluation $OT Eval Moderate Complexity: 1 Mod  12/15/2020  RP, OTR/L  Acute Rehabilitation Services  Office:  [phone]   Metta Clines 12/15/2020, 9:31 AM

## 2020-12-18 ENCOUNTER — Other Ambulatory Visit: Payer: Self-pay

## 2020-12-18 ENCOUNTER — Other Ambulatory Visit: Payer: Self-pay | Admitting: Interventional Radiology

## 2020-12-18 ENCOUNTER — Other Ambulatory Visit: Payer: Medicare Other

## 2020-12-18 ENCOUNTER — Telehealth: Payer: Self-pay

## 2020-12-18 DIAGNOSIS — D62 Acute posthemorrhagic anemia: Secondary | ICD-10-CM

## 2020-12-18 DIAGNOSIS — K922 Gastrointestinal hemorrhage, unspecified: Secondary | ICD-10-CM

## 2020-12-18 DIAGNOSIS — I729 Aneurysm of unspecified site: Secondary | ICD-10-CM

## 2020-12-18 LAB — HEMOGLOBIN, FINGERSTICK: Hemoglobin: 8.4 g/dL — ABNORMAL LOW (ref 11.1–15.9)

## 2020-12-18 NOTE — Telephone Encounter (Signed)
Transition Care Management Follow-up Telephone Call Date of discharge and from where: Stacey Reyes 12/15/20 Diagnosis: Anemia due to GI bleed How have you been since you were released from the hospital? She is feeling a lot better - had hgb checked this morning and levels are going up Any questions or concerns? No  Items Reviewed: Did the pt receive and understand the discharge instructions provided? Yes  Medications obtained and verified? Yes  Other? No  Any new allergies since your discharge? No  Dietary orders reviewed? Yes Do you have support at home? Yes   Home Care and Equipment/Supplies: Were home health services ordered? no If so, what is the name of the agency? N/a  Has the agency set up a time to come to the patient's home? not applicable Were any new equipment or medical supplies ordered?  No What is the name of the medical supply agency? N/a Were you able to get the supplies/equipment? not applicable Do you have any questions related to the use of the equipment or supplies? No  Functional Questionnaire: (I = Independent and D = Dependent) ADLs: I  Bathing/Dressing- I  Meal Prep- I  Eating- I  Maintaining continence- I  Transferring/Ambulation- I  Managing Meds- I  Follow up appointments reviewed:  PCP Hospital f/u appt confirmed? Yes  Scheduled to see Marjorie Smolder as Dr Lajuana Ripple didn't have opening that worked with her on 12/19/20 @ 1. [facility] f/u appt confirmed? Yes  Scheduled to see GI on 10/21 @ 2. Are transportation arrangements needed? No  If their condition worsens, is the pt aware to call PCP or go to the Emergency Dept.? Yes Was the patient provided with contact information for the PCP's office or ED? Yes Was to pt encouraged to call back with questions or concerns? Yes

## 2020-12-19 ENCOUNTER — Ambulatory Visit (INDEPENDENT_AMBULATORY_CARE_PROVIDER_SITE_OTHER): Payer: Medicare Other | Admitting: Family Medicine

## 2020-12-19 ENCOUNTER — Other Ambulatory Visit: Payer: Self-pay | Admitting: Family Medicine

## 2020-12-19 ENCOUNTER — Encounter: Payer: Self-pay | Admitting: Family Medicine

## 2020-12-19 VITALS — BP 109/55 | HR 67 | Temp 97.8°F | Ht 65.0 in | Wt 197.1 lb

## 2020-12-19 DIAGNOSIS — G4700 Insomnia, unspecified: Secondary | ICD-10-CM

## 2020-12-19 DIAGNOSIS — D62 Acute posthemorrhagic anemia: Secondary | ICD-10-CM

## 2020-12-19 DIAGNOSIS — D509 Iron deficiency anemia, unspecified: Secondary | ICD-10-CM

## 2020-12-19 DIAGNOSIS — K3182 Dieulafoy lesion (hemorrhagic) of stomach and duodenum: Secondary | ICD-10-CM

## 2020-12-19 DIAGNOSIS — Z09 Encounter for follow-up examination after completed treatment for conditions other than malignant neoplasm: Secondary | ICD-10-CM | POA: Diagnosis not present

## 2020-12-19 LAB — HEMOGLOBIN, FINGERSTICK: Hemoglobin: 7.9 g/dL — ABNORMAL LOW (ref 11.1–15.9)

## 2020-12-19 MED ORDER — TRAZODONE HCL 50 MG PO TABS: 25.0000 mg | ORAL_TABLET | Freq: Every evening | ORAL | 3 refills | Status: DC | PRN

## 2020-12-19 NOTE — Progress Notes (Signed)
Established Patient Office Visit  Subjective:  Patient ID: Stacey Reyes, female    DOB: 05/12/1945  Age: 75 y.o. MRN: [id number]  CC:  Chief Complaint  Patient presents with   Transitions Of Care    HPI Stacey Reyes presents for [facility].   Today's visit was for Transitional Care Management.  The patient was discharged from [facility] on 12/15/20 with a primary diagnosis of acute blood loss anemia from GI bleed.   Contact with the patient and/or caregiver, by a clinical staff member, was made on 12/18/20 and was documented as a telephone encounter within the EMR.  Through chart review and discussion with the patient I have determined that management of their condition is of moderate complexity.   [name] presented to the ED on 12/10/20 for weakness and shortness of breath. She was anemic. She received 4 units of PRBCs. On 9/19 she underwent an EGD that showed a pulsating vessel with an active bleed consistent with dieulafoy lesion. Bleeding was finally stopped with clips and hemospray. She also underwent a thrombin injection to the right CFA and coiling embolization of the inferior pancreaticoduodenal artery. Discharge hemoglobin was 8. She has previously received iron infusions on 11/09/20 and 11/17/20.  She reports feeling much better. She is still easily fatigued but she is not feeling dizzy or lightheaded. She denies shortness of breath. Stools have return to brown in color.   She has an follow up appointment GI on 10/21 unless her hemoglobin drops more. GI wants to know if her hemoglobin drops by a point or more.   She is also having some difficulty sleeping. This has been ongoing for a few months. She falls asleep easily but then wakes at 3 am and is unable to go back to sleep. She has tried melatonin without improvement.   Past Medical History:  Diagnosis Date   Anemia    Blood transfusion without reported diagnosis    Cataract    removed bilateral   Chronic cystitis     Diverticulitis    Heart murmur    Hyperlipidemia    Hypertension    Personal history of colonic polyps-adenomas 01/07/2012   2009 - 2 diminutive adenomas (prior polyps also) 01/07/2012 - 2 diminutive adenomas      Past Surgical History:  Procedure Laterality Date   BREAST BIOPSY Right    No Scar seen    COLONOSCOPY  multiple   ESOPHAGOGASTRODUODENOSCOPY (EGD) WITH PROPOFOL N/A 11/23/2019   Procedure: ESOPHAGOGASTRODUODENOSCOPY (EGD) WITH PROPOFOL;  Surgeon: Daneil Dolin, MD;  [facility];  Service: Endoscopy;  Laterality: N/A;   ESOPHAGOGASTRODUODENOSCOPY (EGD) WITH PROPOFOL N/A 10/20/2020   Procedure: ESOPHAGOGASTRODUODENOSCOPY (EGD) WITH PROPOFOL;  Surgeon: Eloise Harman, DO;  [facility]: [facility];  Service: Endoscopy;  Laterality: N/A;   ESOPHAGOGASTRODUODENOSCOPY (EGD) WITH PROPOFOL N/A 12/05/2020   Procedure: ESOPHAGOGASTRODUODENOSCOPY (EGD) WITH PROPOFOL;  Surgeon: Rogene Houston, MD;  Location: [facility];  Service: Endoscopy;  Laterality: N/A;  with enteroscopy   ESOPHAGOGASTRODUODENOSCOPY (EGD) WITH PROPOFOL N/A 12/11/2020   Procedure: ESOPHAGOGASTRODUODENOSCOPY (EGD) WITH PROPOFOL;  Surgeon: Eloise Harman, DO;  [facility];  Service: Endoscopy;  Laterality: N/A;   GIVENS CAPSULE STUDY N/A 11/23/2019   Procedure: GIVENS CAPSULE STUDY;  Surgeon: Daneil Dolin, MD;  [facility]: [facility];  Service: Endoscopy;  Laterality: N/A;   GIVENS CAPSULE STUDY N/A 10/22/2020   Procedure: GIVENS CAPSULE STUDY;  Surgeon: Eloise Harman, DO;  [facility];  Service: Endoscopy;  Laterality: N/A;   HEMOSTASIS CLIP PLACEMENT  12/11/2020   Procedure: HEMOSTASIS CLIP PLACEMENT;  Surgeon: Eloise Harman, DO;  [facility];  Service: Endoscopy;;   IR ANGIOGRAM SELECTIVE EACH ADDITIONAL VESSEL  12/05/2020   IR ANGIOGRAM SELECTIVE EACH ADDITIONAL VESSEL  12/05/2020   IR ANGIOGRAM SELECTIVE EACH ADDITIONAL VESSEL  12/05/2020   IR  ANGIOGRAM SELECTIVE EACH ADDITIONAL VESSEL  12/13/2020   IR ANGIOGRAM SELECTIVE EACH ADDITIONAL VESSEL  12/13/2020   IR ANGIOGRAM VISCERAL SELECTIVE  12/05/2020   IR ANGIOGRAM VISCERAL SELECTIVE  12/13/2020   IR EMBO ART  VEN HEMORR LYMPH EXTRAV  INC GUIDE ROADMAPPING  12/05/2020   IR EMBO ART  VEN HEMORR LYMPH EXTRAV  INC GUIDE ROADMAPPING  12/13/2020   IR US GUIDE VASC ACCESS LEFT  12/13/2020   IR US GUIDE VASC ACCESS RIGHT  12/05/2020    Family History  Problem Relation Age of Onset   Colon cancer Mother 58       80's   Breast cancer Sister    Asthma Brother    Colon polyps Neg Hx    Esophageal cancer Neg Hx    Rectal cancer Neg Hx    Stomach cancer Neg Hx     Social History   Socioeconomic History   Marital status: Married    Spouse name: Not on file   Number of children: 1   Years of education: Not on file   Highest education level: Not on file  Occupational History   Occupation: retired  Tobacco Use   Smoking status: Former    Types: Cigarettes    Quit date: 12/23/1984    Years since quitting: 36.0   Smokeless tobacco: Never  Vaping Use   Vaping Use: Never used  Substance and Sexual Activity   Alcohol use: Yes    Comment: rare   Drug use: No   Sexual activity: Not on file  Other Topics Concern   Not on file  Social History Narrative   Patient is married and retired and has 1 grown child   Social Determinants of Radio broadcast assistant Strain: Not on file  Food Insecurity: Not on file  Transportation Needs: Not on file  Physical Activity: Not on file  Stress: Not on file  Social Connections: Not on file  Intimate Partner Violence: Not on file    Outpatient Medications Prior to Visit  Medication Sig Dispense Refill   Ascorbic Acid (VITAMIN C) 1000 MG tablet Take 1,000 mg by mouth daily.     bisoprolol (ZEBETA) 5 MG tablet Take 0.5 tablets (2.5 mg total) by mouth daily. 45 tablet 3   clobetasol ointment (TEMOVATE) 8.18 % Apply 1 application topically 2  (two) times daily as needed (skin).     Copper Gluconate (COPPER CAPS PO) Take 1 capsule by mouth daily.      cyanocobalamin ([employer] VITAMIN B12) 2000 MCG tablet Take 1 tablet (2,000 mcg total) by mouth daily.     Magnesium 200 MG TABS Take 1 tablet (200 mg total) by mouth daily. 30 tablet 6   Multiple Minerals-Vitamins (CALCIUM-MAGNESIUM-ZINC-D3 PO) Take 1 tablet by mouth daily.     pantoprazole (PROTONIX) 40 MG tablet 1 po BID x 8 weeks, then 1 po daily (Patient taking differently: Take 40 mg by mouth See admin instructions. Starting 9.15.22, take 1 tablet by mouth twice daily for 8 weeks, then 1 tablet by mouth once daily.) 90 tablet 1   rosuvastatin (CRESTOR) 10  MG tablet TAKE 1 TABLET BY MOUTH AT  BEDTIME (Patient taking differently: Take 10 mg by mouth daily.) 90 tablet 3   vitamin E 400 UNIT capsule Take 400 Units by mouth daily.      Multiple Vitamin (MULTIVITAMIN) capsule Take 1 capsule by mouth daily.      No facility-administered medications prior to visit.    Allergies  Allergen Reactions   Sulfa Antibiotics Rash    ROS Review of Systems As per HPI.    Objective:    Physical Exam Vitals and nursing note reviewed.  Constitutional:      General: She is not in acute distress.    Appearance: Normal appearance. She is not ill-appearing, toxic-appearing or diaphoretic.  Cardiovascular:     Rate and Rhythm: Normal rate and regular rhythm.     Heart sounds: Murmur heard.  Systolic murmur is present with a grade of 3/6.    No friction rub. No gallop. No S3 or S4 sounds.  Pulmonary:     Effort: Pulmonary effort is normal. No respiratory distress.     Breath sounds: Normal breath sounds. No wheezing, rhonchi or rales.  Chest:     Chest wall: No tenderness.  Musculoskeletal:     Right lower leg: No edema.     Left lower leg: No edema.  Skin:    General: Skin is warm and dry.  Neurological:     General: No focal deficit present.     Mental Status: She is alert and oriented  to person, place, and time.  Psychiatric:        Mood and Affect: Mood normal.        Behavior: Behavior normal.    BP (!) 109/55   Pulse 67   Temp 97.8 F (36.6 C) (Temporal)   Ht 5' 5"  (1.651 m)   Wt 197 lb 2 oz (89.4 kg)   BMI 32.80 kg/m  Wt Readings from Last 3 Encounters:  12/19/20 197 lb 2 oz (89.4 kg)  12/12/20 191 lb 9.3 oz (86.9 kg)  12/06/20 201 lb 15.1 oz (91.6 kg)     Health Maintenance Due  Topic Date Due   COVID-19 Vaccine (4 - Booster for Moderna series) 04/12/2020    There are no preventive care reminders to display for this patient.  Lab Results  Component Value Date   TSH 1.440 03/08/2020   Lab Results  Component Value Date   WBC 6.3 12/14/2020   HGB 8.0 (L) 12/15/2020   HCT 24.1 (L) 12/15/2020   MCV 93.2 12/14/2020   PLT 227 12/14/2020   Lab Results  Component Value Date   NA 136 12/14/2020   K 3.6 12/14/2020   CO2 24 12/14/2020   GLUCOSE 87 12/14/2020   BUN 9 12/14/2020   CREATININE 0.72 12/14/2020   BILITOT 0.6 12/10/2020   ALKPHOS 22 (L) 12/10/2020   AST 20 12/10/2020   ALT 14 12/10/2020   PROT 5.6 (L) 12/10/2020   ALBUMIN 3.1 (L) 12/10/2020   CALCIUM 8.2 (L) 12/14/2020   ANIONGAP 7 12/14/2020   EGFR 84 10/25/2020   Lab Results  Component Value Date   CHOL 185 12/05/2011   Lab Results  Component Value Date   HDL 44 12/05/2011   Lab Results  Component Value Date   LDLCALC 110 12/05/2011   Lab Results  Component Value Date   TRIG 187 (A) 12/05/2011   No results found for: [facility] Lab Results  Component Value Date   HGBA1C 5.6  02/24/2015      Assessment & Plan:   Allen was seen today for transitions of care.  Diagnoses and all orders for this visit:  Acute blood loss anemia Dieulafoy lesion of duodenum Iron deficiency anemia, unspecified iron deficiency anemia type POC Hgb was 7.9 today, down from 8.4 yesterday. Confirmation CBC is pending. Asymptomatic today. Will have patient return tomorrow morning for  POC Hgb. Strict return precautions given.  -     BMP8+EGFR -     Anemia Profile B -     Hemoglobin, fingerstick  Insomnia, unspecified type Sleep hygiene. Will try trazodone as below.  -     traZODone (DESYREL) 50 MG tablet; Take 0.5-1 tablets (25-50 mg total) by mouth at bedtime as needed for sleep.  Hospital discharge follow-up Reviewed hospital record.  -     BMP8+EGFR -     Anemia Profile B   Follow-up: Tomorrow morning for POC Hgb.   The patient indicates understanding of these issues and agrees with the plan.   Gwenlyn Perking, FNP

## 2020-12-19 NOTE — Patient Instructions (Signed)
Gastrointestinal Bleeding Gastrointestinal (GI) bleeding is bleeding somewhere along the digestive tract, between the mouth and the anus. This tract includes the mouth, esophagus, stomach, small intestine, large intestine, and anus. The large intestine is often called the colon. GI bleeding can be caused by various problems. The severity of these problems can range from mild to serious or even life-threatening. If you have GI bleeding, you may find blood in your stools (feces), you may have black stools, or you may vomit blood. If there is a lot of bleeding, you may need to stay in the hospital. What are the causes? This condition may be caused by: Inflammation, irritation, or swelling of the esophagus (esophagitis). The esophagus is part of the body that moves food from your mouth to your stomach. Swollen veins in the rectum (hemorrhoids). Areas of painful tearing in the anus that are often caused by passing hard stool (anal fissures). Pouches that form on the colon over time, with age, and may bleed a lot (diverticulosis). Inflammation (diverticulitis) in areas with diverticulosis. This can cause pain, fever, and bloody stools, although bleeding may be mild. Growths (polyps) or cancer. Colon cancer often starts out as precancerous polyps. Gastritis and ulcers. With these, bleeding may come from the upper GI tract, near the stomach. What increases the risk? You are more likely to develop this condition if you: Have an infection in your stomach from a type of bacteria called Helicobacter pylori. Take certain medicines, such as: NSAIDs. Aspirin. Selective serotonin reuptake inhibitors (SSRIs). Steroids. Antiplatelet or anticoagulant medicines. Smoke. Drink alcohol. What are the signs or symptoms? Common symptoms of this condition include: Bright red blood in your vomit, or vomit that looks like coffee grounds. Bloody, black, or tarry stools. Bleeding from the lower GI tract will usually  cause red or maroon blood in the stools. Bleeding from the upper GI tract may cause black, tarry stools that are often stronger smelling than usual. In certain cases, if the bleeding is fast enough, the stools may be red. Pain or cramping in the abdomen. How is this diagnosed? This condition may be diagnosed based on: Your medical history and a physical exam. Various tests, such as: Blood tests. Stool tests. X-rays and other imaging tests. Esophagogastroduodenoscopy (EGD). In this test, a flexible, lighted tube is used to look at your esophagus, stomach, and small intestine. Colonoscopy. In this test, a flexible, lighted tube is used to look at your colon. How is this treated? Treatment for this condition depends on the cause of the bleeding. For example: For bleeding from the esophagus, stomach, small intestine, or colon, the health care provider doing your EGD or colonoscopy may be able to stop the bleeding as part of the procedure. Inflammation or infection of the colon can be treated with medicines. Certain rectal problems can be treated with creams, suppositories, or warm baths. Medicines may be given to reduce acid in your stomach. Surgery is sometimes needed. Blood transfusions are sometimes needed if a lot of blood has been lost. If bleeding is mild, you may be allowed to go home. If there is a lot of bleeding, you will need to stay in the hospital for observation. Follow these instructions at home:  Take over-the-counter and prescription medicines only as told by your health care provider. Eat foods that are high in fiber, such as beans, whole grains, and fresh fruits and vegetables. This will help to keep your stools soft. Eating 1-3 prunes each day works well for many people. Drink enough  fluid to keep your urine pale yellow. Keep all follow-up visits as told by your health care provider. This is important. Contact a health care provider if: Your symptoms do not improve. Get  help right away if: Your bleeding does not stop. You feel light-headed or you faint. You feel weak. You have severe cramps in your back or abdomen. You pass large blood clots in your stool. Your symptoms are getting worse. You have chest pain or fast heartbeats. Summary Gastrointestinal (GI) bleeding is bleeding somewhere along the digestive tract, between the mouth and anus. GI bleeding can be caused by various problems. The severity of these problems can range from mild to serious or even life-threatening. Treatment for this condition depends on the cause of the bleeding. Take over-the-counter and prescription medicines only as told by your health care provider. Keep all follow-up visits as told by your health care provider. This is important. Get help right away if your bleeding increases, your symptoms are getting worse, or you have new symptoms. This information is not intended to replace advice given to you by your health care provider. Make sure you discuss any questions you have with your health care provider. Document Revised: 10/22/2017 Document Reviewed: 10/22/2017 Elsevier Patient Education  [place].

## 2020-12-20 ENCOUNTER — Other Ambulatory Visit: Payer: Self-pay

## 2020-12-20 ENCOUNTER — Other Ambulatory Visit: Payer: Medicare Other

## 2020-12-20 DIAGNOSIS — D62 Acute posthemorrhagic anemia: Secondary | ICD-10-CM

## 2020-12-20 LAB — HEMOGLOBIN, FINGERSTICK: Hemoglobin: 8.6 g/dL — ABNORMAL LOW (ref 11.1–15.9)

## 2020-12-21 ENCOUNTER — Other Ambulatory Visit (HOSPITAL_COMMUNITY): Payer: Self-pay | Admitting: Physician Assistant

## 2020-12-21 ENCOUNTER — Telehealth: Payer: Self-pay | Admitting: Family Medicine

## 2020-12-21 ENCOUNTER — Other Ambulatory Visit: Payer: Medicare Other

## 2020-12-21 DIAGNOSIS — D62 Acute posthemorrhagic anemia: Secondary | ICD-10-CM | POA: Diagnosis not present

## 2020-12-21 LAB — ANEMIA PROFILE B
Basophils Absolute: 0.1 10*3/uL (ref 0.0–0.2)
Basos: 1 %
EOS (ABSOLUTE): 0.3 10*3/uL (ref 0.0–0.4)
Eos: 5 %
Ferritin: 70 ng/mL (ref 15–150)
Folate: 8.7 ng/mL (ref >3.0)
Hematocrit: 26.8 % — ABNORMAL LOW (ref 34.0–46.6)
Hemoglobin: 8.5 g/dL — CL (ref 11.1–15.9)
Immature Grans (Abs): 0 10*3/uL (ref 0.0–0.1)
Immature Granulocytes: 0 %
Iron Saturation: 7 % — CL (ref 15–55)
Iron: 22 ug/dL — ABNORMAL LOW (ref 27–139)
Lymphocytes Absolute: 1.3 10*3/uL (ref 0.7–3.1)
Lymphs: 22 %
MCH: 29.3 pg (ref 26.6–33.0)
MCHC: 31.7 g/dL (ref 31.5–35.7)
MCV: 92 fL (ref 79–97)
Monocytes Absolute: 0.6 10*3/uL (ref 0.1–0.9)
Monocytes: 9 %
Neutrophils Absolute: 3.8 10*3/uL (ref 1.4–7.0)
Neutrophils: 63 %
Platelets: 403 10*3/uL (ref 150–450)
RBC: 2.9 x10E6/uL — ABNORMAL LOW (ref 3.77–5.28)
RDW: 14.5 % (ref 11.7–15.4)
Retic Ct Pct: 5.7 % — ABNORMAL HIGH (ref 0.6–2.6)
Total Iron Binding Capacity: 334 ug/dL (ref 250–450)
UIBC: 312 ug/dL (ref 118–369)
Vitamin B-12: 891 pg/mL (ref 232–1245)
WBC: 6.1 10*3/uL (ref 3.4–10.8)

## 2020-12-21 LAB — BMP8+EGFR
BUN/Creatinine Ratio: 14 (ref 12–28)
BUN: 8 mg/dL (ref 8–27)
CO2: 23 mmol/L (ref 20–29)
Calcium: 8.8 mg/dL (ref 8.7–10.3)
Chloride: 103 mmol/L (ref 96–106)
Creatinine, Ser: 0.57 mg/dL (ref 0.57–1.00)
Glucose: 86 mg/dL (ref 70–99)
Potassium: 4.6 mmol/L (ref 3.5–5.2)
Sodium: 138 mmol/L (ref 134–144)
eGFR: 95 mL/min/{1.73_m2} (ref >59)

## 2020-12-21 LAB — HEMOGLOBIN, FINGERSTICK: Hemoglobin: 8.8 g/dL — ABNORMAL LOW (ref 11.1–15.9)

## 2020-12-21 NOTE — Telephone Encounter (Signed)
CRITICAL LAB RESULT:  Hemoglobin 8.5

## 2020-12-21 NOTE — Progress Notes (Signed)
I reviewed labs obtained by PCP earlier this week, which show iron deficiency anemia exacerbated by recent GI bleed.  We will schedule patient for IV Feraheme x2 doses, but will keep her previously scheduled appointments with me later in October 2022.

## 2020-12-21 NOTE — Progress Notes (Signed)
Will do.  I will have her come in for IV iron next week.  Nurses / schedulers will call her tomorrow to get it set up.

## 2020-12-21 NOTE — Telephone Encounter (Signed)
Stable for patient 

## 2020-12-25 ENCOUNTER — Inpatient Hospital Stay (HOSPITAL_COMMUNITY): Payer: Medicare Other | Attending: Hematology

## 2020-12-25 ENCOUNTER — Other Ambulatory Visit: Payer: Medicare Other

## 2020-12-25 ENCOUNTER — Other Ambulatory Visit: Payer: Self-pay

## 2020-12-25 ENCOUNTER — Encounter (HOSPITAL_COMMUNITY): Payer: Self-pay

## 2020-12-25 VITALS — BP 126/55 | HR 67 | Temp 98.2°F | Resp 18

## 2020-12-25 DIAGNOSIS — D500 Iron deficiency anemia secondary to blood loss (chronic): Secondary | ICD-10-CM | POA: Diagnosis not present

## 2020-12-25 DIAGNOSIS — D62 Acute posthemorrhagic anemia: Secondary | ICD-10-CM | POA: Diagnosis not present

## 2020-12-25 DIAGNOSIS — D509 Iron deficiency anemia, unspecified: Secondary | ICD-10-CM

## 2020-12-25 DIAGNOSIS — K922 Gastrointestinal hemorrhage, unspecified: Secondary | ICD-10-CM | POA: Diagnosis not present

## 2020-12-25 LAB — HEMOGLOBIN, FINGERSTICK: Hemoglobin: 8.8 g/dL — ABNORMAL LOW (ref 11.1–15.9)

## 2020-12-25 MED ORDER — LORATADINE 10 MG PO TABS
10.0000 mg | ORAL_TABLET | Freq: Once | ORAL | Status: AC
  Administered 2020-12-25: 10 mg via ORAL
  Filled 2020-12-25: qty 1

## 2020-12-25 MED ORDER — SODIUM CHLORIDE 0.9 % IV SOLN: Freq: Once | INTRAVENOUS | Status: AC

## 2020-12-25 MED ORDER — ACETAMINOPHEN 325 MG PO TABS
650.0000 mg | ORAL_TABLET | Freq: Once | ORAL | Status: AC
  Administered 2020-12-25: 650 mg via ORAL
  Filled 2020-12-25: qty 2

## 2020-12-25 MED ORDER — SODIUM CHLORIDE 0.9 % IV SOLN
510.0000 mg | Freq: Once | INTRAVENOUS | Status: AC
  Administered 2020-12-25: 510 mg via INTRAVENOUS
  Filled 2020-12-25: qty 510

## 2020-12-25 NOTE — Progress Notes (Signed)
Pt presents today for Feraheme IV iron infusion per provider's order. Vital signs stable and pt voiced no new complaints at this time.  Peripheral IV started with good blood return pre and post infusion.  Feraheme given today per MD orders. Tolerated infusion without adverse affects. Vital signs stable. No complaints at this time. Discharged from clinic ambulatory in stable condition. Alert and oriented x 3. F/U with [facility] as scheduled.    

## 2020-12-25 NOTE — Patient Instructions (Addendum)
[facility]  Discharge Instructions: Thank you for choosing [facility] to provide your oncology and hematology care.  If you have a lab appointment with the Cancer Center, please come in thru the Main Entrance and check in at the main information desk.  Wear comfortable clothing and clothing appropriate for easy access to any Portacath or PICC line.   We strive to give you quality time with your provider. You may need to reschedule your appointment if you arrive late (15 or more minutes).  Arriving late affects you and other patients whose appointments are after yours.  Also, if you miss three or more appointments without notifying the office, you may be dismissed from the clinic at the provider's discretion.      For prescription refill requests, have your pharmacy contact our office and allow 72 hours for refills to be completed.    Today you received Feraheme IV iron infusion.     BELOW ARE SYMPTOMS THAT SHOULD BE REPORTED IMMEDIATELY: *FEVER GREATER THAN 100.4 F (38 C) OR HIGHER *CHILLS OR SWEATING *NAUSEA AND VOMITING THAT IS NOT CONTROLLED WITH YOUR NAUSEA MEDICATION *UNUSUAL SHORTNESS OF BREATH *UNUSUAL BRUISING OR BLEEDING *URINARY PROBLEMS (pain or burning when urinating, or frequent urination) *BOWEL PROBLEMS (unusual diarrhea, constipation, pain near the anus) TENDERNESS IN MOUTH AND THROAT WITH OR WITHOUT PRESENCE OF ULCERS (sore throat, sores in mouth, or a toothache) UNUSUAL RASH, SWELLING OR PAIN  UNUSUAL VAGINAL DISCHARGE OR ITCHING   Items with * indicate a potential emergency and should be followed up as soon as possible or go to the Emergency Department if any problems should occur.  Please show the CHEMOTHERAPY ALERT CARD or IMMUNOTHERAPY ALERT CARD at check-in to the Emergency Department and triage nurse.  Should you have questions after your visit or need to cancel or reschedule your appointment, please contact [facility]  [phone]  and follow the prompts.  Office hours are 8:00 a.m. to 4:30 p.m. Monday - Friday. Please note that voicemails left after 4:00 p.m. may not be returned until the following business day.  We are closed weekends and major holidays. You have access to a nurse at all times for urgent questions. Please call the main number to the clinic [phone] and follow the prompts.  For any non-urgent questions, you may also contact your provider using MyChart. We now offer e-Visits for anyone 18 and older to request care online for non-urgent symptoms. For details visit [email or web address].   Also download the MyChart app! Go to the app store, search "MyChart", open the app, select [facility], and log in with your MyChart username and password.  Due to Covid, a mask is required upon entering the hospital/clinic. If you do not have a mask, one will be given to you upon arrival. For doctor visits, patients may have 1 support person aged 18 or older with them. For treatment visits, patients cannot have anyone with them due to current Covid guidelines and our immunocompromised population.  

## 2020-12-26 ENCOUNTER — Other Ambulatory Visit: Payer: Medicare Other

## 2020-12-26 DIAGNOSIS — D62 Acute posthemorrhagic anemia: Secondary | ICD-10-CM

## 2020-12-26 LAB — HEMOGLOBIN, FINGERSTICK: Hemoglobin: 8.9 g/dL — ABNORMAL LOW (ref 11.1–15.9)

## 2020-12-29 ENCOUNTER — Other Ambulatory Visit: Payer: Self-pay

## 2020-12-29 ENCOUNTER — Other Ambulatory Visit: Payer: Medicare Other

## 2020-12-29 DIAGNOSIS — D62 Acute posthemorrhagic anemia: Secondary | ICD-10-CM | POA: Diagnosis not present

## 2020-12-29 LAB — HEMOGLOBIN, FINGERSTICK: Hemoglobin: 9.4 g/dL — ABNORMAL LOW (ref 11.1–15.9)

## 2021-01-01 ENCOUNTER — Other Ambulatory Visit: Payer: Medicare Other

## 2021-01-01 ENCOUNTER — Inpatient Hospital Stay (HOSPITAL_COMMUNITY): Payer: Medicare Other

## 2021-01-01 ENCOUNTER — Other Ambulatory Visit: Payer: Self-pay

## 2021-01-01 VITALS — BP 123/68 | HR 70 | Temp 98.0°F | Resp 18

## 2021-01-01 DIAGNOSIS — D62 Acute posthemorrhagic anemia: Secondary | ICD-10-CM | POA: Diagnosis not present

## 2021-01-01 DIAGNOSIS — K922 Gastrointestinal hemorrhage, unspecified: Secondary | ICD-10-CM | POA: Diagnosis not present

## 2021-01-01 DIAGNOSIS — D509 Iron deficiency anemia, unspecified: Secondary | ICD-10-CM

## 2021-01-01 DIAGNOSIS — D500 Iron deficiency anemia secondary to blood loss (chronic): Secondary | ICD-10-CM | POA: Diagnosis not present

## 2021-01-01 LAB — HEMOGLOBIN, FINGERSTICK: Hemoglobin: 9.6 g/dL — ABNORMAL LOW (ref 11.1–15.9)

## 2021-01-01 MED ORDER — ACETAMINOPHEN 325 MG PO TABS
650.0000 mg | ORAL_TABLET | Freq: Once | ORAL | Status: AC
  Administered 2021-01-01: 650 mg via ORAL
  Filled 2021-01-01: qty 2

## 2021-01-01 MED ORDER — SODIUM CHLORIDE 0.9 % IV SOLN
510.0000 mg | Freq: Once | INTRAVENOUS | Status: AC
  Administered 2021-01-01: 510 mg via INTRAVENOUS
  Filled 2021-01-01: qty 510

## 2021-01-01 MED ORDER — LORATADINE 10 MG PO TABS
10.0000 mg | ORAL_TABLET | Freq: Once | ORAL | Status: AC
  Administered 2021-01-01: 10 mg via ORAL
  Filled 2021-01-01: qty 1

## 2021-01-01 MED ORDER — SODIUM CHLORIDE 0.9 % IV SOLN: Freq: Once | INTRAVENOUS | Status: AC

## 2021-01-01 NOTE — Progress Notes (Signed)
Patient presents today for iron infusion.  Patient is in satisfactory condition with no new complaints voiced.  Vital signs are stable.  We will proceed with treatment per MD orders.   Patient tolerated treatment well with no complaints voiced.  Patient left ambulatory in stable condition.  Vital signs stable at discharge.  Follow up as scheduled.    

## 2021-01-01 NOTE — Patient Instructions (Signed)
[facility]  Discharge Instructions: Thank you for choosing [facility] to provide your oncology and hematology care.  If you have a lab appointment with the Cancer Center, please come in thru the Main Entrance and check in at the main information desk.  We strive to give you quality time with your provider. You may need to reschedule your appointment if you arrive late (15 or more minutes).  Arriving late affects you and other patients whose appointments are after yours.  Also, if you miss three or more appointments without notifying the office, you may be dismissed from the clinic at the provider's discretion.      For prescription refill requests, have your pharmacy contact our office and allow 72 hours for refills to be completed.     To help prevent nausea and vomiting after your treatment, we encourage you to take your nausea medication as directed.  BELOW ARE SYMPTOMS THAT SHOULD BE REPORTED IMMEDIATELY: *FEVER GREATER THAN 100.4 F (38 C) OR HIGHER *CHILLS OR SWEATING *NAUSEA AND VOMITING THAT IS NOT CONTROLLED WITH YOUR NAUSEA MEDICATION *UNUSUAL SHORTNESS OF BREATH *UNUSUAL BRUISING OR BLEEDING *URINARY PROBLEMS (pain or burning when urinating, or frequent urination) *BOWEL PROBLEMS (unusual diarrhea, constipation, pain near the anus) TENDERNESS IN MOUTH AND THROAT WITH OR WITHOUT PRESENCE OF ULCERS (sore throat, sores in mouth, or a toothache) UNUSUAL RASH, SWELLING OR PAIN  UNUSUAL VAGINAL DISCHARGE OR ITCHING   Items with * indicate a potential emergency and should be followed up as soon as possible or go to the Emergency Department if any problems should occur.  Should you have questions after your visit or need to cancel or reschedule your appointment, please contact [facility] [phone]  and follow the prompts.  Office hours are 8:00 a.m. to 4:30 p.m. Monday - Friday. Please note that voicemails left after 4:00 p.m. may not be  returned until the following business day.  We are closed weekends and major holidays. You have access to a nurse at all times for urgent questions. Please call the main number to the clinic [phone] and follow the prompts.  For any non-urgent questions, you may also contact your provider using MyChart. We now offer e-Visits for anyone 18 and older to request care online for non-urgent symptoms. For details visit [email or web address].   Also download the MyChart app! Go to the app store, search "MyChart", open the app, select [facility], and log in with your MyChart username and password.  Due to Covid, a mask is required upon entering the hospital/clinic. If you do not have a mask, one will be given to you upon arrival. For doctor visits, patients may have 1 support person aged 18 or older with them. For treatment visits, patients cannot have anyone with them due to current Covid guidelines and our immunocompromised population.  

## 2021-01-05 ENCOUNTER — Inpatient Hospital Stay (HOSPITAL_COMMUNITY): Payer: Medicare Other | Attending: Hematology

## 2021-01-05 ENCOUNTER — Other Ambulatory Visit: Payer: Self-pay

## 2021-01-05 DIAGNOSIS — K921 Melena: Secondary | ICD-10-CM | POA: Insufficient documentation

## 2021-01-05 DIAGNOSIS — D500 Iron deficiency anemia secondary to blood loss (chronic): Secondary | ICD-10-CM | POA: Diagnosis not present

## 2021-01-05 LAB — CBC WITH DIFFERENTIAL/PLATELET
Abs Immature Granulocytes: 0.01 10*3/uL (ref 0.00–0.07)
Basophils Absolute: 0.1 10*3/uL (ref 0.0–0.1)
Basophils Relative: 2 %
Eosinophils Absolute: 0.3 10*3/uL (ref 0.0–0.5)
Eosinophils Relative: 5 %
HCT: 35.3 % — ABNORMAL LOW (ref 36.0–46.0)
Hemoglobin: 10.8 g/dL — ABNORMAL LOW (ref 12.0–15.0)
Immature Granulocytes: 0 %
Lymphocytes Relative: 25 %
Lymphs Abs: 1.3 10*3/uL (ref 0.7–4.0)
MCH: 28.4 pg (ref 26.0–34.0)
MCHC: 30.6 g/dL (ref 30.0–36.0)
MCV: 92.9 fL (ref 80.0–100.0)
Monocytes Absolute: 0.5 10*3/uL (ref 0.1–1.0)
Monocytes Relative: 9 %
Neutro Abs: 3.1 10*3/uL (ref 1.7–7.7)
Neutrophils Relative %: 59 %
Platelets: 207 10*3/uL (ref 150–400)
RBC: 3.8 MIL/uL — ABNORMAL LOW (ref 3.87–5.11)
RDW: 16.6 % — ABNORMAL HIGH (ref 11.5–15.5)
WBC: 5.2 10*3/uL (ref 4.0–10.5)
nRBC: 0 % (ref 0.0–0.2)

## 2021-01-05 LAB — IRON AND TIBC
Iron: 129 ug/dL (ref 28–170)
Saturation Ratios: 35 % — ABNORMAL HIGH (ref 10.4–31.8)
TIBC: 370 ug/dL (ref 250–450)
UIBC: 241 ug/dL

## 2021-01-05 LAB — SAMPLE TO BLOOD BANK

## 2021-01-05 LAB — FERRITIN: Ferritin: 480 ng/mL — ABNORMAL HIGH (ref 11–307)

## 2021-01-10 ENCOUNTER — Ambulatory Visit
Admission: RE | Admit: 2021-01-10 | Discharge: 2021-01-10 | Disposition: A | Discharge status: Home / Self Care | Payer: Medicare Other | Source: Ambulatory Visit | Attending: Interventional Radiology | Admitting: Interventional Radiology

## 2021-01-10 ENCOUNTER — Other Ambulatory Visit: Payer: Self-pay | Admitting: Family Medicine

## 2021-01-10 DIAGNOSIS — I724 Aneurysm of artery of lower extremity: Secondary | ICD-10-CM | POA: Diagnosis not present

## 2021-01-10 DIAGNOSIS — G4700 Insomnia, unspecified: Secondary | ICD-10-CM

## 2021-01-10 DIAGNOSIS — I729 Aneurysm of unspecified site: Secondary | ICD-10-CM

## 2021-01-10 DIAGNOSIS — K922 Gastrointestinal hemorrhage, unspecified: Secondary | ICD-10-CM

## 2021-01-11 ENCOUNTER — Other Ambulatory Visit: Payer: Self-pay

## 2021-01-11 ENCOUNTER — Other Ambulatory Visit: Payer: Medicare Other

## 2021-01-11 ENCOUNTER — Ambulatory Visit
Admission: RE | Admit: 2021-01-11 | Discharge: 2021-01-11 | Disposition: A | Discharge status: Home / Self Care | Payer: Medicare Other | Source: Ambulatory Visit | Attending: Radiology | Admitting: Radiology

## 2021-01-11 ENCOUNTER — Encounter: Payer: Self-pay | Admitting: *Deleted

## 2021-01-11 DIAGNOSIS — D62 Acute posthemorrhagic anemia: Secondary | ICD-10-CM | POA: Diagnosis not present

## 2021-01-11 DIAGNOSIS — I724 Aneurysm of artery of lower extremity: Secondary | ICD-10-CM | POA: Diagnosis not present

## 2021-01-11 DIAGNOSIS — I729 Aneurysm of unspecified site: Secondary | ICD-10-CM

## 2021-01-11 DIAGNOSIS — K922 Gastrointestinal hemorrhage, unspecified: Secondary | ICD-10-CM | POA: Diagnosis not present

## 2021-01-11 HISTORY — PX: IR RADIOLOGIST EVAL & MGMT: IMG5224

## 2021-01-11 LAB — HEMOGLOBIN, FINGERSTICK: Hemoglobin: 11.3 g/dL (ref 11.1–15.9)

## 2021-01-11 NOTE — Progress Notes (Signed)
Chief Complaint: Follow up right CFA pseudoaneurysm, SP mesenteric angio for hemorrhage  Referring Physician(s): Allred,Darrell K  History of Present Illness: IMBERLY TROXLER is a 75 y.o. female presenting as a scheduled follow up to [place] after she was treated in [facility] for mesenteric angiogram on serial occasion, as well as treated with thrombin injection of right CFA pseudoaneurysm.    Ms Halberg joins [place] today by telemedicine visit, given the [place] situation, and we confirmed her identity with 2 personal identifiers.  Her husband joins her on the call.   Ms Kocak underwent thrombin injection of pseudoaneurysm 12/13/20.  This was a consequence of a recent arterial access that was performed for mesenteric angiogram and embolization for upper GI hemorrhage on 9/13.    Follow up duplex 01/10/21 shows resolution of the pseudoaneurysm.    She tells me that she has no problems at all with the arterial access site or the prior right PSA site.  No problems with the left CFA site.    She tells me that she has had resolution of her melena, and her "blood levels" have been continuously rising.  She is quite satisfied with her result.    She has upcoming appointment with GI.   Past Medical History:  Diagnosis Date   Anemia    Blood transfusion without reported diagnosis    Cataract    removed bilateral   Chronic cystitis    Diverticulitis    Heart murmur    Hyperlipidemia    Hypertension    Personal history of colonic polyps-adenomas 01/07/2012   2009 - 2 diminutive adenomas (prior polyps also) 01/07/2012 - 2 diminutive adenomas      Past Surgical History:  Procedure Laterality Date   BREAST BIOPSY Right    No Scar seen    COLONOSCOPY  multiple   ESOPHAGOGASTRODUODENOSCOPY (EGD) WITH PROPOFOL N/A 11/23/2019   Procedure: ESOPHAGOGASTRODUODENOSCOPY (EGD) WITH PROPOFOL;  Surgeon: Daneil Dolin, MD;  [facility];  Service: Endoscopy;  Laterality: N/A;    ESOPHAGOGASTRODUODENOSCOPY (EGD) WITH PROPOFOL N/A 10/20/2020   Procedure: ESOPHAGOGASTRODUODENOSCOPY (EGD) WITH PROPOFOL;  Surgeon: Eloise Harman, DO;  [facility];  Service: Endoscopy;  Laterality: N/A;   ESOPHAGOGASTRODUODENOSCOPY (EGD) WITH PROPOFOL N/A 12/05/2020   Procedure: ESOPHAGOGASTRODUODENOSCOPY (EGD) WITH PROPOFOL;  Surgeon: Rogene Houston, MD;  [facility];  Service: Endoscopy;  Laterality: N/A;  with enteroscopy   ESOPHAGOGASTRODUODENOSCOPY (EGD) WITH PROPOFOL N/A 12/11/2020   Procedure: ESOPHAGOGASTRODUODENOSCOPY (EGD) WITH PROPOFOL;  Surgeon: Eloise Harman, DO;  [facility];  Service: Endoscopy;  Laterality: N/A;   GIVENS CAPSULE STUDY N/A 11/23/2019   Procedure: GIVENS CAPSULE STUDY;  Surgeon: Daneil Dolin, MD;  [facility];  Service: Endoscopy;  Laterality: N/A;   GIVENS CAPSULE STUDY N/A 10/22/2020   Procedure: GIVENS CAPSULE STUDY;  Surgeon: Eloise Harman, DO;  [facility];  Service: Endoscopy;  Laterality: N/A;   HEMOSTASIS CLIP PLACEMENT  12/11/2020   Procedure: HEMOSTASIS CLIP PLACEMENT;  Surgeon: Eloise Harman, DO;  [facility];  Service: Endoscopy;;   IR ANGIOGRAM SELECTIVE EACH ADDITIONAL VESSEL  12/05/2020   IR ANGIOGRAM SELECTIVE EACH ADDITIONAL VESSEL  12/05/2020   IR ANGIOGRAM SELECTIVE EACH ADDITIONAL VESSEL  12/05/2020   IR ANGIOGRAM SELECTIVE EACH ADDITIONAL VESSEL  12/13/2020   IR ANGIOGRAM SELECTIVE EACH ADDITIONAL VESSEL  12/13/2020   IR ANGIOGRAM VISCERAL SELECTIVE  12/05/2020   IR ANGIOGRAM VISCERAL SELECTIVE  12/13/2020  IR EMBO ART  VEN HEMORR LYMPH EXTRAV  INC GUIDE ROADMAPPING  12/05/2020   IR EMBO ART  VEN HEMORR LYMPH EXTRAV  INC GUIDE ROADMAPPING  12/13/2020   IR US GUIDE VASC ACCESS LEFT  12/13/2020   IR US GUIDE VASC ACCESS RIGHT  12/05/2020    Allergies: Sulfa antibiotics  Medications: Prior to Admission medications   Medication Sig Start Date End Date Taking?  Authorizing Provider  Ascorbic Acid (VITAMIN C) 1000 MG tablet Take 1,000 mg by mouth daily.    [provider]  bisoprolol (ZEBETA) 5 MG tablet Take 0.5 tablets (2.5 mg total) by mouth daily. 10/27/20   Lendon Colonel, NP  clobetasol ointment (TEMOVATE) 2.69 % Apply 1 application topically 2 (two) times daily as needed (skin). 10/26/20   [provider]  Copper Gluconate (COPPER CAPS PO) Take 1 capsule by mouth daily.     [provider]  cyanocobalamin ([employer] VITAMIN B12) 2000 MCG tablet Take 1 tablet (2,000 mcg total) by mouth daily. 03/25/19   Johnson, Clanford L, MD  Magnesium 200 MG TABS Take 1 tablet (200 mg total) by mouth daily. 03/23/19   Lendon Colonel, NP  Multiple Minerals-Vitamins (CALCIUM-MAGNESIUM-ZINC-D3 PO) Take 1 tablet by mouth daily.    [provider]  pantoprazole (PROTONIX) 40 MG tablet 1 po BID x 8 weeks, then 1 po daily Patient taking differently: Take 40 mg by mouth See admin instructions. Starting 9.15.22, take 1 tablet by mouth twice daily for 8 weeks, then 1 tablet by mouth once daily. 12/07/20   Johnson, Clanford L, MD  rosuvastatin (CRESTOR) 10 MG tablet TAKE 1 TABLET BY MOUTH AT  BEDTIME Patient taking differently: Take 10 mg by mouth daily. 04/12/20   [name], DO  traZODone (DESYREL) 50 MG tablet TAKE 0.5-1 TABLETS BY MOUTH AT BEDTIME AS NEEDED FOR SLEEP. 01/10/21   Gwenlyn Perking, FNP  vitamin E 400 UNIT capsule Take 400 Units by mouth daily.     [provider]     Family History  Problem Relation Age of Onset   Colon cancer Mother 16       80's   Breast cancer Sister    Asthma Brother    Colon polyps Neg Hx    Esophageal cancer Neg Hx    Rectal cancer Neg Hx    Stomach cancer Neg Hx     Social History   Socioeconomic History   Marital status: Married    Spouse name: Not on file   Number of children: 1   Years of education: Not on file   Highest education level: Not on file   Occupational History   Occupation: retired  Tobacco Use   Smoking status: Former    Types: Cigarettes    Quit date: 12/23/1984    Years since quitting: 36.0   Smokeless tobacco: Never  Vaping Use   Vaping Use: Never used  Substance and Sexual Activity   Alcohol use: Yes    Comment: rare   Drug use: No   Sexual activity: Not on file  Other Topics Concern   Not on file  Social History Narrative   Patient is married and retired and has 1 grown child   Social Determinants of Radio broadcast assistant Strain: Not on file  Food Insecurity: Not on file  Transportation Needs: Not on file  Physical Activity: Not on file  Stress: Not on file  Social Connections: Not on file  Review of Systems  Review of Systems: A 12 point ROS discussed and pertinent positives are indicated in the HPI above.  All other systems are negative.  Physical Exam No direct physical exam was performed (except for noted visual exam findings with Video Visits).   Vital Signs: There were no vitals taken for this visit.  Imaging: IR Angiogram Visceral Selective  Result Date: 12/13/2020 INDICATION: 75 year old female presents for mesenteric angiogram and possible embolization for ongoing upper GI hemorrhage EXAM: ULTRASOUND-GUIDED ACCESS LEFT COMMON FEMORAL ARTERY MESENTERIC ANGIOGRAM COIL EMBOLIZATION OF INFERIOR PANCREATICODUODENAL ARTERIES EXOSEAL FOR HEMOSTASIS MEDICATIONS: None ANESTHESIA/SEDATION: Moderate (conscious) sedation was employed during this procedure. A total of Versed 2.0 mg and Fentanyl 75 mcg was administered intravenously. Moderate Sedation Time: 60 minutes. The patient's level of consciousness and vital signs were monitored continuously by radiology nursing throughout the procedure under my direct supervision. CONTRAST:  54mL OMNIPAQUE IOHEXOL 240 MG/ML SOLN, 42mL OMNIPAQUE IOHEXOL 240 MG/ML SOLN, 84mL OMNIPAQUE IOHEXOL 240 MG/ML SOLN, 38mL OMNIPAQUE IOHEXOL 240 MG/ML SOLN FLUOROSCOPY  TIME:  Fluoroscopy Time: 8 minutes 24 seconds (13 20 mGy). COMPLICATIONS: None PROCEDURE: Informed consent was obtained from the patient following explanation of the procedure, risks, benefits and alternatives. The patient understands, agrees and consents for the procedure. All questions were addressed. A time out was performed prior to the initiation of the procedure. Maximal barrier sterile technique utilized including caps, mask, sterile gowns, sterile gloves, large sterile drape, hand hygiene, and Betadine prep. Ultrasound survey of the left inguinal region was performed with images stored and sent to PACs, confirming patency of the vessel. A micropuncture needle was used access the left common femoral artery under ultrasound. With excellent arterial blood flow returned, and an .018 micro wire was passed through the needle, observed enter the abdominal aorta under fluoroscopy. The needle was removed, and a micropuncture sheath was placed over the wire. The inner dilator and wire were removed, and an 035 Bentson wire was advanced under fluoroscopy into the abdominal aorta. The sheath was removed and a standard 5 [place] vascular sheath was placed. The dilator was removed and the sheath was flushed. C2 cobra catheter was then advanced on the Bentson wire into the abdominal aorta. Cobra catheter was used to select the SMA origin. Angiogram was performed with multiple obliquity and magnification. Glidewire was then used to navigate the base catheter further into the SMA. A high-flow Renegade microcatheter was then advanced on a 16 fathom wire into the targeted arteries for super selective angiogram of the inferior pancreaticoduodenal arteries. Some vaso spasm was created within the targeted arteries, however, angiogram revealed some abnormal mucosal blush without extravasation in the tissues immediately adjacent to the surgical clips. The high-flow Renegade was then removed, and a 105 cm STC microcatheter was then  advanced with the 16 fathom wire. With an adequate catheter position in the targeted artery, coil embolization was performed with a combination of 2 mm and 3 mm coils to stasis. Microcatheter and microwire were removed, the base catheter was withdrawn, and a final angiogram was performed of the SMA territory. Cobra catheter was then withdrawn into the aorta, Bentson wire was passed, and the catheter was further advanced to above the celiac origin. Catheter was then used to engage the celiac artery origin and a celiac artery angiogram was performed. All catheters wires were then removed. Exoseal was deployed at the left common femoral artery puncture site. Sterile dressing was placed. Patient tolerated the procedure well and remained hemodynamically stable throughout. No complications  were encountered and no significant blood loss. IMPRESSION: Status post ultrasound-guided access left common femoral artery for mesenteric angiogram and super selective coil embolization of the inferior pancreaticoduodenal arteries, contributing to abnormal mucosal blush at the site of the surgical clips in the duodenum. Exoseal for hemostasis. Signed, Dulcy Fanny. Dellia Nims, RPVI [facility] Radiology Electronically Signed   By: Corrie Mckusick D.O.   On: 12/13/2020 12:53   IR Angiogram Selective Each Additional Vessel  Result Date: 12/14/2020 INDICATION: 75 year old female presents for mesenteric angiogram and possible embolization for ongoing upper GI hemorrhage EXAM: ULTRASOUND-GUIDED ACCESS LEFT COMMON FEMORAL ARTERY MESENTERIC ANGIOGRAM COIL EMBOLIZATION OF INFERIOR PANCREATICODUODENAL ARTERIES EXOSEAL FOR HEMOSTASIS MEDICATIONS: None ANESTHESIA/SEDATION: Moderate (conscious) sedation was employed during this procedure. A total of Versed 2.0 mg and Fentanyl 75 mcg was administered intravenously. Moderate Sedation Time: 60 minutes. The patient's level of consciousness and vital signs  were monitored continuously by radiology nursing throughout the procedure under my direct supervision. CONTRAST:  60mL OMNIPAQUE IOHEXOL 240 MG/ML SOLN, 33mL OMNIPAQUE IOHEXOL 240 MG/ML SOLN, 62mL OMNIPAQUE IOHEXOL 240 MG/ML SOLN, 38mL OMNIPAQUE IOHEXOL 240 MG/ML SOLN FLUOROSCOPY TIME:  Fluoroscopy Time: 8 minutes 24 seconds (13 20 mGy). COMPLICATIONS: None PROCEDURE: Informed consent was obtained from the patient following explanation of the procedure, risks, benefits and alternatives. The patient understands, agrees and consents for the procedure. All questions were addressed. A time out was performed prior to the initiation of the procedure. Maximal barrier sterile technique utilized including caps, mask, sterile gowns, sterile gloves, large sterile drape, hand hygiene, and Betadine prep. Ultrasound survey of the left inguinal region was performed with images stored and sent to PACs, confirming patency of the vessel. A micropuncture needle was used access the left common femoral artery under ultrasound. With excellent arterial blood flow returned, and an .018 micro wire was passed through the needle, observed enter the abdominal aorta under fluoroscopy. The needle was removed, and a micropuncture sheath was placed over the wire. The inner dilator and wire were removed, and an 035 Bentson wire was advanced under fluoroscopy into the abdominal aorta. The sheath was removed and a standard 5 [place] vascular sheath was placed. The dilator was removed and the sheath was flushed. C2 cobra catheter was then advanced on the Bentson wire into the abdominal aorta. Cobra catheter was used to select the SMA origin. Angiogram was performed with multiple obliquity and magnification. Glidewire was then used to navigate the base catheter further into the SMA. A high-flow Renegade microcatheter was then advanced on a 16 fathom wire into the targeted arteries for super selective angiogram of the inferior pancreaticoduodenal  arteries. Some vaso spasm was created within the targeted arteries, however, angiogram revealed some abnormal mucosal blush without extravasation in the tissues immediately adjacent to the surgical clips. The high-flow Renegade was then removed, and a 105 cm STC microcatheter was then advanced with the 16 fathom wire. With an adequate catheter position in the targeted artery, coil embolization was performed with a combination of 2 mm and 3 mm coils to stasis. Microcatheter and microwire were removed, the base catheter was withdrawn, and a final angiogram was performed of the SMA territory. Cobra catheter was then withdrawn into the aorta, Bentson wire was passed, and the catheter was further advanced to above the celiac origin. Catheter was then used to engage the celiac artery origin and a celiac artery angiogram was performed. All catheters wires were then removed. Exoseal was deployed at the left common femoral  artery puncture site. Sterile dressing was placed. Patient tolerated the procedure well and remained hemodynamically stable throughout. No complications were encountered and no significant blood loss. IMPRESSION: Status post ultrasound-guided access left common femoral artery for mesenteric angiogram and super selective coil embolization of the inferior pancreaticoduodenal arteries, contributing to abnormal mucosal blush at the site of the surgical clips in the duodenum. Exoseal for hemostasis. Signed, Dulcy Fanny. Dellia Nims, RPVI [facility] and Interventional Radiology Specialists [facility] Radiology Electronically Signed   By: Corrie Mckusick D.O.   On: 12/13/2020 12:53   IR Angiogram Selective Each Additional Vessel  Result Date: 12/14/2020 INDICATION: 75 year old female presents for mesenteric angiogram and possible embolization for ongoing upper GI hemorrhage EXAM: ULTRASOUND-GUIDED ACCESS LEFT COMMON FEMORAL ARTERY MESENTERIC ANGIOGRAM COIL EMBOLIZATION OF INFERIOR PANCREATICODUODENAL ARTERIES EXOSEAL  FOR HEMOSTASIS MEDICATIONS: None ANESTHESIA/SEDATION: Moderate (conscious) sedation was employed during this procedure. A total of Versed 2.0 mg and Fentanyl 75 mcg was administered intravenously. Moderate Sedation Time: 60 minutes. The patient's level of consciousness and vital signs were monitored continuously by radiology nursing throughout the procedure under my direct supervision. CONTRAST:  47mL OMNIPAQUE IOHEXOL 240 MG/ML SOLN, 67mL OMNIPAQUE IOHEXOL 240 MG/ML SOLN, 73mL OMNIPAQUE IOHEXOL 240 MG/ML SOLN, 72mL OMNIPAQUE IOHEXOL 240 MG/ML SOLN FLUOROSCOPY TIME:  Fluoroscopy Time: 8 minutes 24 seconds (13 20 mGy). COMPLICATIONS: None PROCEDURE: Informed consent was obtained from the patient following explanation of the procedure, risks, benefits and alternatives. The patient understands, agrees and consents for the procedure. All questions were addressed. A time out was performed prior to the initiation of the procedure. Maximal barrier sterile technique utilized including caps, mask, sterile gowns, sterile gloves, large sterile drape, hand hygiene, and Betadine prep. Ultrasound survey of the left inguinal region was performed with images stored and sent to PACs, confirming patency of the vessel. A micropuncture needle was used access the left common femoral artery under ultrasound. With excellent arterial blood flow returned, and an .018 micro wire was passed through the needle, observed enter the abdominal aorta under fluoroscopy. The needle was removed, and a micropuncture sheath was placed over the wire. The inner dilator and wire were removed, and an 035 Bentson wire was advanced under fluoroscopy into the abdominal aorta. The sheath was removed and a standard 5 [place] vascular sheath was placed. The dilator was removed and the sheath was flushed. C2 cobra catheter was then advanced on the Bentson wire into the abdominal aorta. Cobra catheter was used to select the SMA origin. Angiogram was performed with  multiple obliquity and magnification. Glidewire was then used to navigate the base catheter further into the SMA. A high-flow Renegade microcatheter was then advanced on a 16 fathom wire into the targeted arteries for super selective angiogram of the inferior pancreaticoduodenal arteries. Some vaso spasm was created within the targeted arteries, however, angiogram revealed some abnormal mucosal blush without extravasation in the tissues immediately adjacent to the surgical clips. The high-flow Renegade was then removed, and a 105 cm STC microcatheter was then advanced with the 16 fathom wire. With an adequate catheter position in the targeted artery, coil embolization was performed with a combination of 2 mm and 3 mm coils to stasis. Microcatheter and microwire were removed, the base catheter was withdrawn, and a final angiogram was performed of the SMA territory. Cobra catheter was then withdrawn into the aorta, Bentson wire was passed, and the catheter was further advanced to above the celiac origin. Catheter was then used to engage the celiac artery origin and a  celiac artery angiogram was performed. All catheters wires were then removed. Exoseal was deployed at the left common femoral artery puncture site. Sterile dressing was placed. Patient tolerated the procedure well and remained hemodynamically stable throughout. No complications were encountered and no significant blood loss. IMPRESSION: Status post ultrasound-guided access left common femoral artery for mesenteric angiogram and super selective coil embolization of the inferior pancreaticoduodenal arteries, contributing to abnormal mucosal blush at the site of the surgical clips in the duodenum. Exoseal for hemostasis. Signed, Dulcy Fanny. Dellia Nims, RPVI Vascular and Interventional Radiology Specialists [facility] Radiology Electronically Signed   By: Corrie Mckusick D.O.   On: 12/13/2020 12:53   [place] Compress [place]  Result Date: 12/13/2020 INDICATION:  75 year old female presents for treatment of right common femoral artery pseudoaneurysm EXAM: ULTRASOUND-GUIDED NEEDLE PLACEMENT THROMBIN INJECTION OF PSEUDOANEURYSM MEDICATIONS: None ANESTHESIA/SEDATION: No moderate sedation. CONTRAST:  None FLUOROSCOPY TIME:  Ultrasound COMPLICATIONS: None PROCEDURE: Informed consent was obtained from the patient following explanation of the procedure, risks, benefits and alternatives. The patient understands, agrees and consents for the procedure. All questions were addressed. A time out was performed prior to the initiation of the procedure. Maximal barrier sterile technique utilized including caps, mask, sterile gowns, sterile gloves, large sterile drape, hand hygiene, and Betadine prep. Ultrasound images were performed of the right inguinal region, confirming persistence pseudoaneurysm associated with the right common femoral artery. Estimated diameter 17 mm. The patient was then prepped and draped in the usual sterile fashion. 1% lidocaine was used for local anesthesia. Bovine thrombin was then reconstituted. A 21 gauge needle was advanced under ultrasound guidance through the dome of the pseudoaneurysm into the mid segment. Once we confirmed needle tip position, embolization was then performed under ultrasound visualization. Because of the rapidity with which the small pseudoaneurysm thrombosed, a needle exchange was made for a 20 gauge needle, confirming no return of flow. Color flow confirmed embolization of the entire aneurysm. Needle was removed and a final image was stored. 2+ palpable pulses at the right DP and PT confirmed at the conclusion. Sterile dressing was placed. Patient tolerated the procedure well and remained hemodynamically stable throughout. No complications were encountered and no significant blood loss. IMPRESSION: Status post treatment of right common femoral artery pseudoaneurysm with ultrasound-guided thrombin injection. Signed, Dulcy Fanny. Dellia Nims,  RPVI V[facility] and Interventional Radiology Specialists [facility] Radiology Electronically Signed   By: Corrie Mckusick D.O.   On: 12/13/2020 14:40   IR US Guide Vasc Access Left  Result Date: 12/13/2020 INDICATION: 75 year old female presents for mesenteric angiogram and possible embolization for ongoing upper GI hemorrhage EXAM: ULTRASOUND-GUIDED ACCESS LEFT COMMON FEMORAL ARTERY MESENTERIC ANGIOGRAM COIL EMBOLIZATION OF INFERIOR PANCREATICODUODENAL ARTERIES EXOSEAL FOR HEMOSTASIS MEDICATIONS: None ANESTHESIA/SEDATION: Moderate (conscious) sedation was employed during this procedure. A total of Versed 2.0 mg and Fentanyl 75 mcg was administered intravenously. Moderate Sedation Time: 60 minutes. The patient's level of consciousness and vital signs were monitored continuously by radiology nursing throughout the procedure under my direct supervision. CONTRAST:  30mL OMNIPAQUE IOHEXOL 240 MG/ML SOLN, 41mL OMNIPAQUE IOHEXOL 240 MG/ML SOLN, 23mL OMNIPAQUE IOHEXOL 240 MG/ML SOLN, 84mL OMNIPAQUE IOHEXOL 240 MG/ML SOLN FLUOROSCOPY TIME:  Fluoroscopy Time: 8 minutes 24 seconds (13 20 mGy). COMPLICATIONS: None PROCEDURE: Informed consent was obtained from the patient following explanation of the procedure, risks, benefits and alternatives. The patient understands, agrees and consents for the procedure. All questions were addressed. A time out was performed prior to the initiation of the procedure. Maximal barrier sterile technique utilized  including caps, mask, sterile gowns, sterile gloves, large sterile drape, hand hygiene, and Betadine prep. Ultrasound survey of the left inguinal region was performed with images stored and sent to PACs, confirming patency of the vessel. A micropuncture needle was used access the left common femoral artery under ultrasound. With excellent arterial blood flow returned, and an .018 micro wire was passed through the needle, observed enter the abdominal aorta under fluoroscopy. The needle  was removed, and a micropuncture sheath was placed over the wire. The inner dilator and wire were removed, and an 035 Bentson wire was advanced under fluoroscopy into the abdominal aorta. The sheath was removed and a standard 5 [place] vascular sheath was placed. The dilator was removed and the sheath was flushed. C2 cobra catheter was then advanced on the Bentson wire into the abdominal aorta. Cobra catheter was used to select the SMA origin. Angiogram was performed with multiple obliquity and magnification. Glidewire was then used to navigate the base catheter further into the SMA. A high-flow Renegade microcatheter was then advanced on a 16 fathom wire into the targeted arteries for super selective angiogram of the inferior pancreaticoduodenal arteries. Some vaso spasm was created within the targeted arteries, however, angiogram revealed some abnormal mucosal blush without extravasation in the tissues immediately adjacent to the surgical clips. The high-flow Renegade was then removed, and a 105 cm STC microcatheter was then advanced with the 16 fathom wire. With an adequate catheter position in the targeted artery, coil embolization was performed with a combination of 2 mm and 3 mm coils to stasis. Microcatheter and microwire were removed, the base catheter was withdrawn, and a final angiogram was performed of the SMA territory. Cobra catheter was then withdrawn into the aorta, Bentson wire was passed, and the catheter was further advanced to above the celiac origin. Catheter was then used to engage the celiac artery origin and a celiac artery angiogram was performed. All catheters wires were then removed. Exoseal was deployed at the left common femoral artery puncture site. Sterile dressing was placed. Patient tolerated the procedure well and remained hemodynamically stable throughout. No complications were encountered and no significant blood loss. IMPRESSION: Status post ultrasound-guided access left common  femoral artery for mesenteric angiogram and super selective coil embolization of the inferior pancreaticoduodenal arteries, contributing to abnormal mucosal blush at the site of the surgical clips in the duodenum. Exoseal for hemostasis. Signed, Dulcy Fanny. Dellia Nims, RPVI [facility] Radiology Electronically Signed   By: Corrie Mckusick D.O.   On: 12/13/2020 12:53   VAS [place] GROIN PSEUDOANEURYSM  Result Date: 12/14/2020  ARTERIAL PSEUDOANEURYSM  Patient Name:  LERIN JECH  Date of Exam:   12/14/2020 Medical Rec #: [id number]          Accession #:    [id number] Date of Birth: 23-Jun-1945          Patient Gender: F Patient Age:   61 years Exam Location:  [facility] Procedure:      VAS [place] GROIN PSEUDOANEURYSM Referring Phys: Soyla Dryer --------------------------------------------------------------------------------  Exam: Right groin Indications: Patient complains of s/p right femoral artery thrombin injection. History: S/p catheterization. Comparison Study: No prior study Performing Technologist: Maudry Mayhew MHA, RDMS, RVT, RDCS  Examination Guidelines: A complete evaluation includes B-mode imaging, spectral Doppler, color Doppler, and power Doppler as needed of all accessible portions of each vessel. Bilateral testing is considered an integral part of a complete examination. Limited examinations for reoccurring indications may be performed  as noted. +------------+----------+---------+------+----------+ Right DuplexPSV (cm/s)Waveform PlaqueComment(s) +------------+----------+---------+------+----------+ CFA            114    triphasic                 +------------+----------+---------+------+----------+ PFA            119    biphasic                  +------------+----------+---------+------+----------+ Prox SFA       124    triphasic                 +------------+----------+---------+------+----------+ Right Vein  comments:CFV patent and compressible  Findings: A hypoechoic structure measuring approximately 1.5 cm x 1.7 cm is visualized at the right groin with ultrasound characteristics of a hematoma. This is suggestive of sucecssfulrombin injection.  Diagnosing physician: Jamelle Haring Electronically signed by Jamelle Haring on 12/14/2020 at 6:59:37 PM.   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 FINDINGS: Grayscale and color duplex performed in the region of clinical concern. Patent common femoral artery with patent superficial branches. Multiphasic waveform of common femoral artery. No evidence of persisting pseudoaneurysm of the right common femoral artery. IMPRESSION: Directed duplex of the right common femoral artery demonstrates resolution of the prior pseudoaneurysm Signed, Dulcy Fanny. Dellia Nims, RPVI [facility] Inter[facility] Radiology Special[facility] Radiology Electronically Signed   By: Corrie Mckusick D.O.   On: 01/10/2021 11:45   IR EMBO ART  VEN HEMORR LYMPH EXTRAV  INC GUIDE ROADMAPPING  Result Date: 12/14/2020 INDICATION: 75 year old female presents for mesenteric angiogram and possible embolization for ongoing upper GI hemorrhage EXAM: ULTRASOUND-GUIDED ACCESS LEFT COMMON FEMORAL ARTERY MESENTERIC ANGIOGRAM COIL EMBOLIZATION OF INFERIOR PANCREATICODUODENAL ARTERIES EXOSEAL FOR HEMOSTASIS MEDICATIONS: None ANESTHESIA/SEDATION: Moderate (conscious) sedation was employed during this procedure. A total of Versed 2.0 mg and Fentanyl 75 mcg was administered  intravenously. Moderate Sedation Time: 60 minutes. The patient's level of consciousness and vital signs were monitored continuously by radiology nursing throughout the procedure under my direct supervision. CONTRAST:  35mL OMNIPAQUE IOHEXOL 240 MG/ML SOLN, 74mL OMNIPAQUE IOHEXOL 240 MG/ML SOLN, 75mL OMNIPAQUE IOHEXOL 240 MG/ML SOLN, 60mL OMNIPAQUE IOHEXOL 240 MG/ML SOLN FLUOROSCOPY TIME:  Fluoroscopy Time: 8 minutes 24 seconds (13 20 mGy). COMPLICATIONS: None PROCEDURE: Informed consent was obtained from the patient following explanation of the procedure, risks, benefits and alternatives. The patient understands, agrees and consents for the procedure. All questions were addressed. A time out was performed prior to the initiation of the procedure. Maximal barrier sterile technique utilized including caps, mask, sterile gowns, sterile gloves, large sterile drape, hand hygiene, and Betadine prep. Ultrasound survey of the left inguinal region was performed with images stored and sent to PACs, confirming patency of the vessel. A micropuncture needle was used access the left common femoral artery under ultrasound. With excellent arterial blood flow returned, and an .018 micro wire was passed through the needle, observed enter the abdominal aorta under fluoroscopy. The needle was removed, and a micropuncture sheath was placed over the wire. The inner dilator and wire were removed, and an 035 Bentson wire was advanced under fluoroscopy into the abdominal aorta. The sheath was removed  and a standard 5 [place] vascular sheath was placed. The dilator was removed and the sheath was flushed. C2 cobra catheter was then advanced on the Bentson wire into the abdominal aorta. Cobra catheter was used to select the SMA origin. Angiogram was performed with multiple obliquity and magnification. Glidewire was then used to navigate the base catheter further into the SMA. A high-flow Renegade microcatheter was then advanced on a 16 fathom  wire into the targeted arteries for super selective angiogram of the inferior pancreaticoduodenal arteries. Some vaso spasm was created within the targeted arteries, however, angiogram revealed some abnormal mucosal blush without extravasation in the tissues immediately adjacent to the surgical clips. The high-flow Renegade was then removed, and a 105 cm STC microcatheter was then advanced with the 16 fathom wire. With an adequate catheter position in the targeted artery, coil embolization was performed with a combination of 2 mm and 3 mm coils to stasis. Microcatheter and microwire were removed, the base catheter was withdrawn, and a final angiogram was performed of the SMA territory. Cobra catheter was then withdrawn into the aorta, Bentson wire was passed, and the catheter was further advanced to above the celiac origin. Catheter was then used to engage the celiac artery origin and a celiac artery angiogram was performed. All catheters wires were then removed. Exoseal was deployed at the left common femoral artery puncture site. Sterile dressing was placed. Patient tolerated the procedure well and remained hemodynamically stable throughout. No complications were encountered and no significant blood loss. IMPRESSION: Status post ultrasound-guided access left common femoral artery for mesenteric angiogram and super selective coil embolization of the inferior pancreaticoduodenal arteries, contributing to abnormal mucosal blush at the site of the surgical clips in the duodenum. Exoseal for hemostasis. Signed, Dulcy Fanny. Dellia Nims, RPVI [facility] Interventional Radiology Specialists [facility] Radiology Electronically Signed   By: Corrie Mckusick D.O.   On: 12/13/2020 12:53    Labs:  CBC: Recent Labs    12/13/20 0115 12/14/20 0137 12/15/20 0033 12/19/20 1330 01/05/21 1035  WBC 5.9 6.3  --  6.1 5.2  HGB 7.8* 7.5* 8.0* 8.5* 10.8*  HCT 24.7* 23.4* 24.1* 26.8* 35.3*  PLT 210 227  --  403 207     COAGS: Recent Labs    10/19/20 1919 10/20/20 0439 12/10/20 1120  INR 1.0 1.1 1.1  APTT  --  29  --     BMP: Recent Labs    12/10/20 1120 12/11/20 0627 12/11/20 1206 12/13/20 0115 12/14/20 0137 12/19/20 1330  NA 136 137 138 136 136 138  K 3.9 3.9 4.1 3.4* 3.6 4.6  CL 106 108 107 107 105 103  CO2 25 27  --  23 24 23   GLUCOSE 119* 103* 100* 99 87 86  BUN 20 17 16 11 9 8   CALCIUM 7.9* 7.8*  --  8.0* 8.2* 8.8  CREATININE 0.61 0.62 0.50 0.70 0.72 0.57  GFRNONAA >60 >60  --  >60 >60  --     LIVER FUNCTION TESTS: Recent Labs    10/19/20 1919 10/20/20 0439 12/04/20 1223 12/10/20 1120  BILITOT 0.4 0.8 0.5 0.6  AST 35 32 24 20  ALT 33 30 18 14   ALKPHOS 28* 21* 25* 22*  PROT 5.7* 5.0* 5.5* 5.6*  ALBUMIN 3.6 3.1* 3.6 3.1*    TUMOR MARKERS: No results for input(s): AFPTM, CEA, CA199, CHROMGRNA in the last 8760 hours.  Assessment and Plan:  Ms Ficek is a very pleasant 75 yo woman with recurrent  GI bleeding from upper GI source, treated with serial mesenteric angio and selective coil embolization on 9/13 and 9/21.  Also, there was a PSA of the right CFA access site, that was treated 9/21 with thrombin injection.    Duplex yesterday shows [place] that the PSA is healed, and she happily lets me know today that she has had, apparently, good response from the embolization, with resolved melena and improving H&H.   I did encourage her to continue to follow up with GI on schedule, such that they could best manage her risk factors for GI ulcer/hemorrhage.  She understands.   We are happy to see her back as needed, and she understands that there is no need at this time for scheduled follow up .   Electronically Signed: Corrie Mckusick 01/11/2021, 11:25 AM   I spent a total of    15 Minutes in remote  clinical consultation, greater than 50% of which was counseling/coordinating care for mesenteric angiogram and embolization, treatment of right CFA pseudoaneurysm with thrombin  injection.    Visit type: Audio only (telephone). Audio (no video) only due to patient's lack of internet/smartphone capability. Alternative for in-person consultation at [facility], [address] [place], [place], [place]. This visit type was conducted due to national recommendations for restrictions regarding the COVID-19 Pandemic (e.g. social distancing).  This format is felt to be most appropriate for this patient at this time.  All issues noted in this document were discussed and addressed.

## 2021-01-11 NOTE — Progress Notes (Signed)
Virtual Visit via Telephone Note [facility]  I connected with Stacey Reyes  on 01/12/21 at 1:15 PM by telephone and verified that I am speaking with the correct person using two identifiers.  Location: Patient: Home Provider: [facility]   I discussed the limitations, risks, security and privacy concerns of performing an evaluation and management service by telephone and the availability of in person appointments. I also discussed with the patient that there may be a patient responsible charge related to this service. The patient expressed understanding and agreed to proceed.  REASON FOR VISIT:  Follow-up for iron deficiency   CURRENT THERAPY: Intermittent IV iron infusions (Last Feraheme on 12/25/2020 and 01/01/2021)  HISTORY OF PRESENT ILLNESS: Stacey Reyes follows at our clinic due to iron deficiency and anemia.  She was last seen in clinic by Tarri Abernethy PA-C on 11/07/2020.  HOSPITAL STAY (12/04/2020-12/07/2020): Presented to ED with melena and weakness, Hgb 7.6, admitted to hospital.  EGD (12/05/2020) showed red blood in the second portion of the duodenum without bleeding lesion identified and no hemostasis after injecting epinephrine, therefore patient was sent to IR at [facility] to undergo angiogram with embolization.  Patient tolerated the procedure well, and it was deemed to be a success at that time, therefore she was discharged home on 12/07/2020.  She received 4 units PRBC during this admission.  HOSPITAL STAY (12/10/2020-12/15/2020): Returned to the ED with recurrent melena and Hgb 5.6.  EGD (12/11/2020) showed actively bleeding Dieulafoy lesion in third/fourth portion of duodenum.  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  Patient required 4 units PRBC during this admission.  Follow-up labs obtained by PCP showed persistent iron  deficiency due to recent GI bleed, therefore patient was scheduled for IV Feraheme x2 doses which she received on 12/25/2020 and 01/01/2021.  At today's visit, she reports feeling "better than she has felt in a long time."  She is doing very well after having her bleeding AVM treated and having received blood transfusions.  She also feels much better after getting her IV iron earlier in October.  She has not noted any more melena or other signs of blood loss such as hematemesis or hematochezia.  Her fatigue and dyspnea on exertion have nearly resolved.  She denies any chest pain, pica, restless legs, lightheadedness, or syncope.  She reports that she has 70% energy and 100% appetite.  She endorses that she is maintaining a stable weight.    OBSERVATIONS/OBJECTIVE: Review of Systems  Constitutional:  Positive for malaise/fatigue (Energy 70%, fatigue is much improved from before). Negative for chills, diaphoresis, fever and weight loss.  Respiratory:  Positive for shortness of breath (With exertion; much better than before). Negative for cough.   Cardiovascular:  Positive for leg swelling (Mild ankle swelling). Negative for chest pain and palpitations.  Gastrointestinal:  Negative for abdominal pain, blood in stool, melena, nausea and vomiting.  Neurological:  Negative for dizziness and headaches.  Psychiatric/Behavioral:  The patient has insomnia (Disrupted sleep).     PHYSICAL EXAM (per limitations of virtual telephone visit): The patient is alert and oriented x 3, exhibiting adequate mentation, good mood, and ability to speak in full sentences and execute sound judgement.   ASSESSMENT & PLAN: 1.  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  No improvement despite taking iron tablet twice daily - Patient has had multiple hospitalizations within the past  year for acute blood loss anemia, is followed closely by gastroenterology - She had two hospitalizations during the month of September 2022, and received a total of 8 units PRBC during these hospital stays - [place] (12/10/2020-12/15/2020): Presented to the ED with recurrent melena and Hgb 5.6.  EGD (12/11/2020) showed actively bleeding Dieulafoy lesion in third/fourth portion of duodenum.  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 - Most recent IV Feraheme on 12/25/2020 and 01/01/2021 -She has not noted any melena or other signs of bleeding since she was discharged from the hospital after correction of AVM - She is feeling much better after blood transfusions, IV iron, and correction of bleeding AVM - Most recent labs (01/05/2021): Hgb 10.8, ferritin 480, iron saturation 35% - PLAN: Repeat labs and RTC in 6-8 weeks with phone visit (patient request) - Patient is aware of alarm symptoms that would prompt immediate medical attention.  2.  B12 deficiency - She is taking B12 tablet 1000 mcg daily - Most recent labs (10/30/2020): B12 874, methylmalonic acid 138 - PLAN: Continue B12 supplement.  We will recheck levels every 6 to 12 months.    FOLLOW UP INSTRUCTIONS: - Labs in 8 weeks - Phone visit after labs    I discussed the assessment and treatment plan with the patient. The patient was provided an opportunity to ask questions and all were answered. The patient agreed with the plan and demonstrated an understanding of the instructions.   The patient was advised to call back or seek an in-person evaluation if the symptoms worsen or if the condition fails to improve as anticipated.  I provided 12 minutes of non-face-to-face time during this encounter.   Harriett Rush, PA-C 01/12/2021 1:44 PM

## 2021-01-12 ENCOUNTER — Inpatient Hospital Stay (HOSPITAL_BASED_OUTPATIENT_CLINIC_OR_DEPARTMENT_OTHER): Payer: Medicare Other | Admitting: Physician Assistant

## 2021-01-12 DIAGNOSIS — D500 Iron deficiency anemia secondary to blood loss (chronic): Secondary | ICD-10-CM

## 2021-01-22 ENCOUNTER — Other Ambulatory Visit: Payer: Self-pay

## 2021-01-22 ENCOUNTER — Encounter: Payer: Self-pay | Admitting: Family Medicine

## 2021-01-22 ENCOUNTER — Ambulatory Visit (INDEPENDENT_AMBULATORY_CARE_PROVIDER_SITE_OTHER): Payer: Medicare Other | Admitting: Family Medicine

## 2021-01-22 VITALS — BP 123/70 | HR 71 | Temp 97.7°F | Ht 65.0 in | Wt 198.5 lb

## 2021-01-22 DIAGNOSIS — D62 Acute posthemorrhagic anemia: Secondary | ICD-10-CM | POA: Diagnosis not present

## 2021-01-22 DIAGNOSIS — R609 Edema, unspecified: Secondary | ICD-10-CM | POA: Diagnosis not present

## 2021-01-22 LAB — HEMOGLOBIN, FINGERSTICK: Hemoglobin: 12 g/dL (ref 11.1–15.9)

## 2021-01-22 MED ORDER — HYDROCHLOROTHIAZIDE 12.5 MG PO CAPS: 12.5000 mg | ORAL_CAPSULE | Freq: Every day | ORAL | 0 refills | Status: DC

## 2021-01-22 NOTE — Patient Instructions (Signed)
Peripheral Edema Peripheral edema is swelling that is caused by a buildup of fluid. Peripheral edema most often affects the lower legs, ankles, and feet. It can also develop in the arms, hands, and face. The area of the body that has peripheral edema will look swollen. It may also feel heavy or warm. Your clothes may start to feel tight. Pressing on the area may make a temporary dent in your skin. You may not be able to move your swollen arm or leg as much as usual. There are many causes of peripheral edema. It can happen because of a complication of other conditions such as congestive heart failure, kidney disease, or a problem with your blood circulation. It also can be a side effect of certain medicines or because of an infection. It often happens to women during pregnancy. Sometimes, the cause is not known. Follow these instructions at home: Managing pain, stiffness, and swelling  Raise (elevate) your legs while you are sitting or lying down. Move around often to prevent stiffness and to lessen swelling. Do not sit or stand for long periods of time. Wear support stockings as told by your health care provider. Medicines Take over-the-counter and prescription medicines only as told by your health care provider. Your health care provider may prescribe medicine to help your body get rid of excess water (diuretic). General instructions Pay attention to any changes in your symptoms. Follow instructions from your health care provider about limiting salt (sodium) in your diet. Sometimes, eating less salt may reduce swelling. Moisturize skin daily to help prevent skin from cracking and draining. Keep all follow-up visits as told by your health care provider. This is important. Contact a health care provider if you have: A fever. Edema that starts suddenly or is getting worse, especially if you are pregnant or have a medical condition. Swelling in only one leg. Increased swelling, redness, or pain in  one or both of your legs. Drainage or sores at the area where you have edema. Get help right away if you: Develop shortness of breath, especially when you are lying down. Have pain in your chest or abdomen. Feel weak. Feel faint. Summary Peripheral edema is swelling that is caused by a buildup of fluid. Peripheral edema most often affects the lower legs, ankles, and feet. Move around often to prevent stiffness and to lessen swelling. Do not sit or stand for long periods of time. Pay attention to any changes in your symptoms. Contact a health care provider if you have edema that starts suddenly or is getting worse, especially if you are pregnant or have a medical condition. Get help right away if you develop shortness of breath, especially when lying down. This information is not intended to replace advice given to you by your health care provider. Make sure you discuss any questions you have with your health care provider. Document Revised: 12/03/2017 Document Reviewed: 12/03/2017 Elsevier Patient Education  2022 [employer]  

## 2021-01-22 NOTE — Progress Notes (Signed)
Acute Office Visit  Subjective:    Patient ID: Stacey Reyes, female    DOB: February 22, 1946, 75 y.o.   MRN: [id number]  Chief Complaint  Patient presents with   Edema    HPI Patient is in today for edema in her lower legs x 4 weeks. The swelling gets worse throughout the day. She is now waking with some swelling in her legs in the morning, although it does improve overnight. By the end of the day her lower legs feel heavy and tender. She used to be on lisinopril-HCTZ but this was discontinued for hypotension. She reports her BP in the evenings is often 633H systolic. She denies shortness of breath, cough, orthopnea, dizziness, or chest pain.   Past Medical History:  Diagnosis Date   Anemia    Blood transfusion without reported diagnosis    Cataract    removed bilateral   Chronic cystitis    Diverticulitis    Heart murmur    Hyperlipidemia    Hypertension    Personal history of colonic polyps-adenomas 01/07/2012   2009 - 2 diminutive adenomas (prior polyps also) 01/07/2012 - 2 diminutive adenomas      Past Surgical History:  Procedure Laterality Date   BREAST BIOPSY Right    No Scar seen    COLONOSCOPY  multiple   ESOPHAGOGASTRODUODENOSCOPY (EGD) WITH PROPOFOL N/A 11/23/2019   Procedure: ESOPHAGOGASTRODUODENOSCOPY (EGD) WITH PROPOFOL;  Surgeon: Daneil Dolin, MD;  [facility];  Service: Endoscopy;  Laterality: N/A;   ESOPHAGOGASTRODUODENOSCOPY (EGD) WITH PROPOFOL N/A 10/20/2020   Procedure: ESOPHAGOGASTRODUODENOSCOPY (EGD) WITH PROPOFOL;  Surgeon: Eloise Harman, DO;  [facility];  Service: Endoscopy;  Laterality: N/A;   ESOPHAGOGASTRODUODENOSCOPY (EGD) WITH PROPOFOL N/A 12/05/2020   Procedure: ESOPHAGOGASTRODUODENOSCOPY (EGD) WITH PROPOFOL;  Surgeon: Rogene Houston, MD;  [facility];  Service: Endoscopy;  Laterality: N/A;  with enteroscopy   ESOPHAGOGASTRODUODENOSCOPY (EGD) WITH PROPOFOL N/A 12/11/2020   Procedure:  ESOPHAGOGASTRODUODENOSCOPY (EGD) WITH PROPOFOL;  Surgeon: Eloise Harman, DO;  [facility];  Service: Endoscopy;  Laterality: N/A;   GIVENS CAPSULE STUDY N/A 11/23/2019   Procedure: GIVENS CAPSULE STUDY;  Surgeon: Daneil Dolin, MD;  [facility];  Service: Endoscopy;  Laterality: N/A;   GIVENS CAPSULE STUDY N/A 10/22/2020   Procedure: GIVENS CAPSULE STUDY;  Surgeon: Eloise Harman, DO;  [facility];  Service: Endoscopy;  Laterality: N/A;   HEMOSTASIS CLIP PLACEMENT  12/11/2020   Procedure: HEMOSTASIS CLIP PLACEMENT;  Surgeon: Eloise Harman, DO;  [facility];  Service: Endoscopy;;   IR ANGIOGRAM SELECTIVE EACH ADDITIONAL VESSEL  12/05/2020   IR ANGIOGRAM SELECTIVE EACH ADDITIONAL VESSEL  12/05/2020   IR ANGIOGRAM SELECTIVE EACH ADDITIONAL VESSEL  12/05/2020   IR ANGIOGRAM SELECTIVE EACH ADDITIONAL VESSEL  12/13/2020   IR ANGIOGRAM SELECTIVE EACH ADDITIONAL VESSEL  12/13/2020   IR ANGIOGRAM VISCERAL SELECTIVE  12/05/2020   IR ANGIOGRAM VISCERAL SELECTIVE  12/13/2020   IR EMBO ART  VEN HEMORR LYMPH EXTRAV  INC GUIDE ROADMAPPING  12/05/2020   IR EMBO ART  VEN HEMORR LYMPH EXTRAV  INC GUIDE ROADMAPPING  12/13/2020   IR RADIOLOGIST EVAL & MGMT  01/11/2021   IR US GUIDE VASC ACCESS LEFT  12/13/2020   IR US GUIDE VASC ACCESS RIGHT  12/05/2020    Family History  Problem Relation Age of Onset   Colon cancer Mother 100       80's   Breast  cancer Sister    Asthma Brother    Colon polyps Neg Hx    Esophageal cancer Neg Hx    Rectal cancer Neg Hx    Stomach cancer Neg Hx     Social History   Socioeconomic History   Marital status: Married    Spouse name: Not on file   Number of children: 1   Years of education: Not on file   Highest education level: Not on file  Occupational History   Occupation: retired  Tobacco Use   Smoking status: Former    Types: Cigarettes    Quit date: 12/23/1984    Years since quitting: 36.1   Smokeless tobacco:  Never  Vaping Use   Vaping Use: Never used  Substance and Sexual Activity   Alcohol use: Yes    Comment: rare   Drug use: No   Sexual activity: Not on file  Other Topics Concern   Not on file  Social History Narrative   Patient is married and retired and has 1 grown child   Social Determinants of Radio broadcast assistant Strain: Not on file  Food Insecurity: Not on file  Transportation Needs: Not on file  Physical Activity: Not on file  Stress: Not on file  Social Connections: Not on file  Intimate Partner Violence: Not on file    Outpatient Medications Prior to Visit  Medication Sig Dispense Refill   Ascorbic Acid (VITAMIN C) 1000 MG tablet Take 1,000 mg by mouth daily.     bisoprolol (ZEBETA) 5 MG tablet Take 0.5 tablets (2.5 mg total) by mouth daily. 45 tablet 3   clobetasol ointment (TEMOVATE) 1.27 % Apply 1 application topically 2 (two) times daily as needed (skin).     Copper Gluconate (COPPER CAPS PO) Take 1 capsule by mouth daily.      cyanocobalamin ([employer] VITAMIN B12) 2000 MCG tablet Take 1 tablet (2,000 mcg total) by mouth daily.     Magnesium 200 MG TABS Take 1 tablet (200 mg total) by mouth daily. 30 tablet 6   Multiple Minerals-Vitamins (CALCIUM-MAGNESIUM-ZINC-D3 PO) Take 1 tablet by mouth daily.     pantoprazole (PROTONIX) 40 MG tablet 1 po BID x 8 weeks, then 1 po daily (Patient taking differently: Take 40 mg by mouth See admin instructions. Starting 9.15.22, take 1 tablet by mouth twice daily for 8 weeks, then 1 tablet by mouth once daily.) 90 tablet 1   rosuvastatin (CRESTOR) 10 MG tablet TAKE 1 TABLET BY MOUTH AT  BEDTIME (Patient taking differently: Take 10 mg by mouth daily.) 90 tablet 3   traZODone (DESYREL) 50 MG tablet TAKE 0.5-1 TABLETS BY MOUTH AT BEDTIME AS NEEDED FOR SLEEP. 90 tablet 0   vitamin E 400 UNIT capsule Take 400 Units by mouth daily.      No facility-administered medications prior to visit.    Allergies  Allergen Reactions   Sulfa  Antibiotics Rash    Review of Systems As per HPI.     Objective:    Physical Exam Vitals and nursing note reviewed.  Constitutional:      General: She is not in acute distress.    Appearance: She is not ill-appearing, toxic-appearing or diaphoretic.  Cardiovascular:     Rate and Rhythm: Normal rate and regular rhythm.     Heart sounds: Murmur heard.  Systolic murmur is present with a grade of 2/6.    No friction rub. No gallop. No S3 or S4 sounds.  Pulmonary:  Effort: Pulmonary effort is normal. No respiratory distress.     Breath sounds: No wheezing, rhonchi or rales.  Chest:     Chest wall: No tenderness.  Musculoskeletal:     Right lower leg: 1+ Pitting Edema present.     Left lower leg: 1+ Pitting Edema present.  Skin:    General: Skin is warm and dry.     Findings: No erythema or rash.  Neurological:     General: No focal deficit present.     Mental Status: She is alert and oriented to person, place, and time.  Psychiatric:        Mood and Affect: Mood normal.        Behavior: Behavior normal.    BP 123/70   Pulse 71   Temp 97.7 F (36.5 C) (Temporal)   Ht 5' 5"  (1.651 m)   Wt 198 lb 8 oz (90 kg)   BMI 33.03 kg/m  Wt Readings from Last 3 Encounters:  01/22/21 198 lb 8 oz (90 kg)  12/19/20 197 lb 2 oz (89.4 kg)  12/12/20 191 lb 9.3 oz (86.9 kg)    There are no preventive care reminders to display for this patient.  There are no preventive care reminders to display for this patient.   Lab Results  Component Value Date   TSH 1.440 03/08/2020   Lab Results  Component Value Date   WBC 5.2 01/05/2021   HGB 10.8 (L) 01/05/2021   HCT 35.3 (L) 01/05/2021   MCV 92.9 01/05/2021   PLT 207 01/05/2021   Lab Results  Component Value Date   NA 138 12/19/2020   K 4.6 12/19/2020   CO2 23 12/19/2020   GLUCOSE 86 12/19/2020   BUN 8 12/19/2020   CREATININE 0.57 12/19/2020   BILITOT 0.6 12/10/2020   ALKPHOS 22 (L) 12/10/2020   AST 20 12/10/2020   ALT  14 12/10/2020   PROT 5.6 (L) 12/10/2020   ALBUMIN 3.1 (L) 12/10/2020   CALCIUM 8.8 12/19/2020   ANIONGAP 7 12/14/2020   EGFR 95 12/19/2020   Lab Results  Component Value Date   CHOL 185 12/05/2011   Lab Results  Component Value Date   HDL 44 12/05/2011   Lab Results  Component Value Date   LDLCALC 110 12/05/2011   Lab Results  Component Value Date   TRIG 187 (A) 12/05/2011   No results found for: [facility] Lab Results  Component Value Date   HGBA1C 5.6 02/24/2015       Assessment & Plan:   Makalyn was seen today for edema.  Diagnoses and all orders for this visit:  Peripheral edema Labs pending. Will restart low dose of HCTZ. Discussed compression socks, elevation, hydration, and low salt diet.  -     hydrochlorothiazide (MICROZIDE) 12.5 MG capsule; Take 1 capsule (12.5 mg total) by mouth daily. -     BMP8+EGFR  Return to office for new or worsening symptoms, or if symptoms persist.   The patient indicates understanding of these issues and agrees with the plan.  Gwenlyn Perking, FNP

## 2021-01-23 LAB — BMP8+EGFR
BUN/Creatinine Ratio: 14 (ref 12–28)
BUN: 9 mg/dL (ref 8–27)
CO2: 23 mmol/L (ref 20–29)
Calcium: 9.1 mg/dL (ref 8.7–10.3)
Chloride: 104 mmol/L (ref 96–106)
Creatinine, Ser: 0.64 mg/dL (ref 0.57–1.00)
Glucose: 119 mg/dL — ABNORMAL HIGH (ref 70–99)
Potassium: 4 mmol/L (ref 3.5–5.2)
Sodium: 140 mmol/L (ref 134–144)
eGFR: 92 mL/min/{1.73_m2} (ref >59)

## 2021-02-01 ENCOUNTER — Other Ambulatory Visit: Payer: Self-pay

## 2021-02-01 ENCOUNTER — Other Ambulatory Visit: Payer: Medicare Other

## 2021-02-01 DIAGNOSIS — D62 Acute posthemorrhagic anemia: Secondary | ICD-10-CM

## 2021-02-01 LAB — HEMOGLOBIN, FINGERSTICK: Hemoglobin: 13.4 g/dL (ref 11.1–15.9)

## 2021-02-11 ENCOUNTER — Encounter: Payer: Self-pay | Admitting: Family Medicine

## 2021-02-12 ENCOUNTER — Ambulatory Visit (INDEPENDENT_AMBULATORY_CARE_PROVIDER_SITE_OTHER): Payer: Medicare Other | Admitting: Family Medicine

## 2021-02-12 DIAGNOSIS — B3731 Acute candidiasis of vulva and vagina: Secondary | ICD-10-CM

## 2021-02-12 MED ORDER — FLUCONAZOLE 150 MG PO TABS: 150.0000 mg | ORAL_TABLET | Freq: Once | ORAL | 0 refills | Status: AC

## 2021-02-12 NOTE — Progress Notes (Signed)
Telephone visit  Subjective: CC: vaginitis PCP: [name], DO ATF:[id number] Stacey Reyes is a 75 y.o. female calls for telephone consult today. Patient provides verbal consent for consult held via phone.  Due to COVID-19 pandemic this visit was conducted virtually. This visit type was conducted due to national recommendations for restrictions regarding the COVID-19 Pandemic (e.g. social distancing, sheltering in place) in an effort to limit this patient's exposure and mitigate transmission in our community. All issues noted in this document were discussed and addressed.  A physical exam was not performed with this format.   Location of patient: home Location of provider: [facility] Others present for call: none  1. Yeast infection Patient was on antibiotics a few weeks ago but really she thinks that this yeast vaginitis started after she was in hot weather in [place] this past week.  She reports vaginal irritation that is more prominent when she urinates but denies this being treated dysuria from the urethra.  No hematuria reported.  No pelvic pain or nausea reported.  She has had this recurrently unfortunately and it does resolve with Diflucan.   ROS: Per HPI  Allergies  Allergen Reactions   Sulfa Antibiotics Rash   Past Medical History:  Diagnosis Date   Anemia    Blood transfusion without reported diagnosis    Cataract    removed bilateral   Chronic cystitis    Diverticulitis    Heart murmur    Hyperlipidemia    Hypertension    Personal history of colonic polyps-adenomas 01/07/2012   2009 - 2 diminutive adenomas (prior polyps also) 01/07/2012 - 2 diminutive adenomas      Current Outpatient Medications:    Ascorbic Acid (VITAMIN C) 1000 MG tablet, Take 1,000 mg by mouth daily., Disp: , Rfl:    bisoprolol (ZEBETA) 5 MG tablet, Take 0.5 tablets (2.5 mg total) by mouth daily., Disp: 45 tablet, Rfl: 3   clobetasol ointment (TEMOVATE) 4.27 %, Apply 1 application topically 2 (two)  times daily as needed (skin)., Disp: , Rfl:    Copper Gluconate (COPPER CAPS PO), Take 1 capsule by mouth daily. , Disp: , Rfl:    cyanocobalamin ([employer] VITAMIN B12) 2000 MCG tablet, Take 1 tablet (2,000 mcg total) by mouth daily., Disp:  , Rfl:    hydrochlorothiazide (MICROZIDE) 12.5 MG capsule, Take 1 capsule (12.5 mg total) by mouth daily., Disp: 90 capsule, Rfl: 0   Magnesium 200 MG TABS, Take 1 tablet (200 mg total) by mouth daily., Disp: 30 tablet, Rfl: 6   Multiple Minerals-Vitamins (CALCIUM-MAGNESIUM-ZINC-D3 PO), Take 1 tablet by mouth daily., Disp: , Rfl:    pantoprazole (PROTONIX) 40 MG tablet, 1 po BID x 8 weeks, then 1 po daily (Patient taking differently: Take 40 mg by mouth See admin instructions. Starting 9.15.22, take 1 tablet by mouth twice daily for 8 weeks, then 1 tablet by mouth once daily.), Disp: 90 tablet, Rfl: 1   rosuvastatin (CRESTOR) 10 MG tablet, TAKE 1 TABLET BY MOUTH AT  BEDTIME (Patient taking differently: Take 10 mg by mouth daily.), Disp: 90 tablet, Rfl: 3   traZODone (DESYREL) 50 MG tablet, TAKE 0.5-1 TABLETS BY MOUTH AT BEDTIME AS NEEDED FOR SLEEP., Disp: 90 tablet, Rfl: 0   vitamin E 400 UNIT capsule, Take 400 Units by mouth daily. , Disp: , Rfl:   Assessment/ Plan: 75 y.o. female   Vaginal yeast infection - Plan: fluconazole (DIFLUCAN) 150 MG tablet  Clinically consistent with a vaginal yeast infection.  Diflucan sent.  I have given her a couple of extra tablets to have on hand should it not resolve after the first dose.  She understands reasons for reevaluation.  She will follow-up as needed  Start time: 10:17am End time: 10:22am  Total time spent on patient care (including telephone call/ virtual visit): 5 minutes  [employer], [place] [phone]

## 2021-02-12 NOTE — Telephone Encounter (Signed)
Ok to double book me and I will call her during lunch to provide rx

## 2021-02-19 NOTE — Progress Notes (Signed)
Cardiology Office Note   Date:  02/21/2021   ID:  Stacey Reyes, Stacey Reyes 23-Oct-1945, MRN [id number] PCP:  [name], DO  Cardiologist:   Minus Breeding, MD   Chief Complaint  Patient presents with   Shortness of Breath       History of Present Illness: Stacey Reyes is a 75 y.o. female who is referred by [name], DO for evaluation of a heart murmur .  She saw Dr. Pernell Dupre for evaluation of palpitations.  She was seen in 2017 with SVT or afib suspected but not detected on event monitor.   Echo EF was normal in 2017.  I saw her with a murmur that was thought to be more prominent.   Echo EF was 75% with severe LVH.   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  She was admitted to the hospital after her last visit here.  She was found to have profound anemia. She was found to have severe B12 deficiency.  She required transfusions.  Of note I did send her for a PYP scan.  I had wanted to get an MRI but cost was prohibitive at that time.  She subsequently had this and was found to have asymmetric septal hypertrophy.  There was no evidence of amyloid.   She was admitted with GI blood loss with capsule endoscopy indicating possible lymphangiectasias .  She has had clipping and says that the bleeding is gone and her hemoglobin has normalized and been stable.  Since then she feels much better. The patient denies any new symptoms such as chest discomfort, neck or arm discomfort. There has been no new shortness of breath, PND or orthopnea. There have been no reported palpitations, presyncope or syncope.  Her dyspnea is much improved.   Past Medical History:  Diagnosis Date   Anemia    Blood transfusion without reported diagnosis    Cataract    removed bilateral   Chronic cystitis    Diverticulitis    Heart murmur    Hyperlipidemia    Hypertension    Personal history of colonic polyps-adenomas 01/07/2012   2009 - 2 diminutive adenomas (prior polyps also) 01/07/2012 - 2  diminutive adenomas      Past Surgical History:  Procedure Laterality Date   BREAST BIOPSY Right    No Scar seen    COLONOSCOPY  multiple   ESOPHAGOGASTRODUODENOSCOPY (EGD) WITH PROPOFOL N/A 11/23/2019   Procedure: ESOPHAGOGASTRODUODENOSCOPY (EGD) WITH PROPOFOL;  Surgeon: Daneil Dolin, MD;  [facility];  Service: Endoscopy;  Laterality: N/A;   ESOPHAGOGASTRODUODENOSCOPY (EGD) WITH PROPOFOL N/A 10/20/2020   Procedure: ESOPHAGOGASTRODUODENOSCOPY (EGD) WITH PROPOFOL;  Surgeon: Eloise Harman, DO;  [facility];  Service: Endoscopy;  Laterality: N/A;   ESOPHAGOGASTRODUODENOSCOPY (EGD) WITH PROPOFOL N/A 12/05/2020   Procedure: ESOPHAGOGASTRODUODENOSCOPY (EGD) WITH PROPOFOL;  Surgeon: Rogene Houston, MD;  [facility];  Service: Endoscopy;  Laterality: N/A;  with enteroscopy   ESOPHAGOGASTRODUODENOSCOPY (EGD) WITH PROPOFOL N/A 12/11/2020   Procedure: ESOPHAGOGASTRODUODENOSCOPY (EGD) WITH PROPOFOL;  Surgeon: Eloise Harman, DO;  [facility];  Service: Endoscopy;  Laterality: N/A;   GIVENS CAPSULE STUDY N/A 11/23/2019   Procedure: GIVENS CAPSULE STUDY;  Surgeon: Daneil Dolin, MD;  [facility];  Service: Endoscopy;  Laterality: N/A;   GIVENS CAPSULE STUDY N/A 10/22/2020   Procedure: GIVENS CAPSULE STUDY;  Surgeon: Eloise Harman, DO;  [facility];  Service: Endoscopy;  Laterality: N/A;  HEMOSTASIS CLIP PLACEMENT  12/11/2020   Procedure: HEMOSTASIS CLIP PLACEMENT;  Surgeon: Eloise Harman, DO;  [facility];  Service: Endoscopy;;   IR ANGIOGRAM SELECTIVE EACH ADDITIONAL VESSEL  12/05/2020   IR ANGIOGRAM SELECTIVE EACH ADDITIONAL VESSEL  12/05/2020   IR ANGIOGRAM SELECTIVE EACH ADDITIONAL VESSEL  12/05/2020   IR ANGIOGRAM SELECTIVE EACH ADDITIONAL VESSEL  12/13/2020   IR ANGIOGRAM SELECTIVE EACH ADDITIONAL VESSEL  12/13/2020   IR ANGIOGRAM VISCERAL SELECTIVE  12/05/2020   IR ANGIOGRAM VISCERAL SELECTIVE   12/13/2020   IR EMBO ART  VEN HEMORR LYMPH EXTRAV  INC GUIDE ROADMAPPING  12/05/2020   IR EMBO ART  VEN HEMORR LYMPH EXTRAV  INC GUIDE ROADMAPPING  12/13/2020   IR RADIOLOGIST EVAL & MGMT  01/11/2021   IR US GUIDE VASC ACCESS LEFT  12/13/2020   IR US GUIDE VASC ACCESS RIGHT  12/05/2020     Current Outpatient Medications  Medication Sig Dispense Refill   Ascorbic Acid (VITAMIN C) 1000 MG tablet Take 1,000 mg by mouth daily.     bisoprolol (ZEBETA) 5 MG tablet Take 0.5 tablets (2.5 mg total) by mouth daily. 45 tablet 3   clobetasol ointment (TEMOVATE) 9.47 % Apply 1 application topically 2 (two) times daily as needed (skin).     Copper Gluconate (COPPER CAPS PO) Take 1 capsule by mouth daily.      hydrochlorothiazide (MICROZIDE) 12.5 MG capsule Take 1 capsule (12.5 mg total) by mouth daily. 90 capsule 0   Magnesium 200 MG TABS Take 1 tablet (200 mg total) by mouth daily. 30 tablet 6   Multiple Minerals-Vitamins (CALCIUM-MAGNESIUM-ZINC-D3 PO) Take 1 tablet by mouth daily.     mupirocin ointment (BACTROBAN) 2 % Apply 1 application topically 2 (two) times daily for 7 days. Ok to substitute with cream if ointment not covered 22 g 0   pantoprazole (PROTONIX) 40 MG tablet 1 po BID x 8 weeks, then 1 po daily (Patient taking differently: Take 40 mg by mouth See admin instructions. Starting 9.15.22, take 1 tablet by mouth twice daily for 8 weeks, then 1 tablet by mouth once daily.) 90 tablet 1   rosuvastatin (CRESTOR) 10 MG tablet TAKE 1 TABLET BY MOUTH AT  BEDTIME (Patient taking differently: Take 10 mg by mouth daily.) 90 tablet 3   traZODone (DESYREL) 50 MG tablet TAKE 0.5-1 TABLETS BY MOUTH AT BEDTIME AS NEEDED FOR SLEEP. 90 tablet 0   vitamin E 400 UNIT capsule Take 400 Units by mouth daily.      cyanocobalamin ([employer] VITAMIN B12) 2000 MCG tablet Take 1 tablet (2,000 mcg total) by mouth daily. (Patient not taking: Reported on 02/21/2021)     No current facility-administered medications for this visit.     Allergies:   Sulfa antibiotics    ROS:  Please see the history of present illness.   Otherwise, review of systems are positive for none.   All other systems are reviewed and negative.    PHYSICAL EXAM: VS:  BP 118/64   Pulse 68   Ht 5' 5.5" (1.664 m)   Wt 197 lb (89.4 kg)   SpO2 97%   BMI 32.28 kg/m  , BMI Body mass index is 32.28 kg/m. GENERAL:  Well appearing NECK:  No jugular venous distention, waveform within normal limits, carotid upstroke brisk and symmetric, no bruits, no thyromegaly LUNGS:  Clear to auscultation bilaterally CHEST:  Unremarkable HEART:  PMI not displaced or sustained,S1 and S2 within normal limits, no S3, no S4,  no clicks, no rubs, 3 out of 6 apical systolic murmur increasing with the strain phase of Valsalva, no diastolic murmurs ABD:  Flat, positive bowel sounds normal in frequency in pitch, no bruits, no rebound, no guarding, no midline pulsatile mass, no hepatomegaly, no splenomegaly EXT:  2 plus pulses throughout, trace leg edema, no cyanosis no clubbing   EKG:  EKG is not ordered today. NA   Recent Labs: 03/08/2020: TSH 1.440 10/25/2020: NT-Pro BNP 1,440 12/10/2020: ALT 14 12/14/2020: Magnesium 2.0 01/05/2021: Hemoglobin 10.8; Platelets 207 01/22/2021: BUN 9; Creatinine, Ser 0.64; Potassium 4.0; Sodium 140    Lipid Panel    Component Value Date/Time   CHOL 185 12/05/2011 0000   TRIG 187 (A) 12/05/2011 0000   HDL 44 12/05/2011 0000   LDLCALC 110 12/05/2011 0000      Wt Readings from Last 3 Encounters:  02/21/21 197 lb (89.4 kg)  02/20/21 197 lb 9.6 oz (89.6 kg)  01/22/21 198 lb 8 oz (90 kg)      Other studies Reviewed: Additional studies/ records that were reviewed today include:   GI records Review of the above records demonstrates:    See elsewhere   ASSESSMENT AND PLAN:   Dyspnea:     This is related to her anemia as it has resolved.  No change in therapy.   LVH/HCM:    She has asymmetric septal hypertrophy.   She does  not have any high risk features for SCD   I did review her consultation with Dr. Broadus John and the patient has no identifiable gene.  Her family has been counseled about the need for echocardiography.  This is a son and 2 sisters.   Hypertension: Patient's blood pressure is at target.  No change in therapy.   Current medicines are reviewed at length with the patient today.  The patient does not have concerns regarding medicines.  The following changes have been made:  None   Labs/ tests ordered today include: None   No orders of the defined types were placed in this encounter.    Disposition:   FU with me in 12  months.    Signed, Minus Breeding, MD  02/21/2021 10:46 AM    [facility]

## 2021-02-20 ENCOUNTER — Encounter: Payer: Self-pay | Admitting: Family Medicine

## 2021-02-20 ENCOUNTER — Ambulatory Visit (INDEPENDENT_AMBULATORY_CARE_PROVIDER_SITE_OTHER): Payer: Medicare Other | Admitting: Family Medicine

## 2021-02-20 VITALS — BP 120/60 | HR 75 | Temp 97.4°F | Ht 65.0 in | Wt 197.6 lb

## 2021-02-20 DIAGNOSIS — L089 Local infection of the skin and subcutaneous tissue, unspecified: Secondary | ICD-10-CM | POA: Diagnosis not present

## 2021-02-20 DIAGNOSIS — S80922A Unspecified superficial injury of left lower leg, initial encounter: Secondary | ICD-10-CM

## 2021-02-20 MED ORDER — MUPIROCIN 2 % EX OINT: 1.0000 "application " | TOPICAL_OINTMENT | Freq: Two times a day (BID) | CUTANEOUS | 0 refills | Status: AC

## 2021-02-20 NOTE — Patient Instructions (Signed)
Wound Care, Adult ?Taking care of your wound properly can help to prevent pain, infection, and scarring. It can also help your wound heal more quickly. Follow instructions from your health care provider about how to care for your wound. ?Supplies needed: ?Soap and water. ?Wound cleanser, saline, or germ-free (sterile) water. ?Gauze. ?If needed, a clean bandage (dressing) or other type of wound dressing material to cover or place in the wound. Follow your health care provider's instructions about what dressing supplies to use. ?Cream or topical ointment to apply to the wound, if told by your health care provider. ?How to care for your wound ?Cleaning the wound ?Ask your health care provider how to clean the wound. This may include: ?Using mild soap and water, a wound cleanser, saline, or sterile water. ?Using a clean gauze to pat the wound dry after cleaning it. Do not rub or scrub the wound. ?Dressing care ?Wash your hands with soap and water for at least 20 seconds before and after you change the dressing. If soap and water are not available, use hand sanitizer. ?Change your dressing as told by your health care provider. This may include: ?Cleaning or rinsing out (irrigating) the wound. ?Application of cream or topical ointment, if told by your health care provider. ?Placing a dressing over the wound or in the wound (packing). ?Covering the wound with an outer dressing. ?Leave stitches (sutures), staples, skin glue, or adhesive strips in place. These skin closures may need to stay in place for 2 weeks or longer. If adhesive strip edges start to loosen and curl up, you may trim the loose edges. Do not remove adhesive strips completely unless your health care provider tells you to do that. ?Ask your health care provider when you can leave the wound uncovered. ?Checking for infection ?Check your wound area every day for signs of infection. Check for: ?More redness, swelling, or pain. ?Fluid or blood. ?Warmth. ?Pus or  a bad smell. ? ?Follow these instructions at home ?Medicines ?If you were prescribed an antibiotic medicine, cream, or ointment, take or apply it as told by your health care provider. Do not stop using the antibiotic even if your condition improves. ?If you were prescribed pain medicine, take it 30 minutes before you do any wound care or as told by your health care provider. ?Take over-the-counter and prescription medicines only as told by your health care provider. ?Eating and drinking ?Eat a diet that includes protein, vitamin A, vitamin C, and other nutrient-rich foods to help the wound heal. ?Foods rich in protein include meat, fish, eggs, dairy, beans, and nuts. ?Foods rich in vitamin A include carrots and dark green, leafy vegetables. ?Foods rich in vitamin C include citrus fruits, tomatoes, broccoli, and peppers. ?Drink enough fluid to keep your urine pale yellow. ?General instructions ?Do not take baths, swim, or use a hot tub until your health care provider approves. Ask your health care provider if you may take showers. You may only be allowed to take sponge baths. ?Do not scratch or pick at the wound. Keep it covered as told by your health care provider. ?Return to your normal activities as told by your health care provider. Ask your health care provider what activities are safe for you. ?Protect your wound from the sun when you are outside for the first 6 months, or for as long as told by your health care provider. Cover up the scar area or apply sunscreen that has an SPF of at least 30. ?Do not   use any products that contain nicotine or tobacco. These products include cigarettes, chewing tobacco, and vaping devices, such as e-cigarettes. If you need help quitting, ask your health care provider. ?Keep all follow-up visits. This is important. ?Contact a health care provider if: ?You received a tetanus shot and you have swelling, severe pain, redness, or bleeding at the injection site. ?Your pain is not  controlled with medicine. ?You have any of these signs of infection: ?More redness, swelling, or pain around the wound. ?Fluid or blood coming from the wound. ?Warmth coming from the wound. ?A fever or chills. ?You are nauseous or you vomit. ?You are dizzy. ?You have a new rash or hardness around the wound. ?Get help right away if: ?You have a red streak of skin near the area around your wound. ?Pus or a bad smell coming from the wound. ?Your wound has been closed with staples, sutures, skin glue, or adhesive strips and it begins to open up and separate. ?Your wound is bleeding, and the bleeding does not stop with gentle pressure. ?These symptoms may represent a serious problem that is an emergency. Do not wait to see if the symptoms will go away. Get medical help right away. Call your local emergency services (911 in the [place] Do not drive yourself to the hospital. ?Summary ?Always wash your hands with soap and water for at least 20 seconds before and after changing your dressing. ?Change your dressing as told by your health care provider. ?To help with healing, eat foods that are rich in protein, vitamin A, vitamin C, and other nutrients. ?Check your wound every day for signs of infection. Contact your health care provider if you think that your wound is infected. ?This information is not intended to replace advice given to you by your health care provider. Make sure you discuss any questions you have with your health care provider. ?Document Revised: 07/18/2020 Document Reviewed: 07/18/2020 ?Elsevier Patient Education ? 2022 Elsevier Inc[employer] ? ?

## 2021-02-20 NOTE — Progress Notes (Signed)
Subjective: CC:?infected skin sore PCP: [name], DO IWP:[id number] H Reller is a 75 y.o. female presenting to clinic today for:  1. Skin lesion Patient reports that she scraped her left lower extremity on cement while she was on vacation a couple of weeks ago.  She has had some tenderness along the left anterior shin into the ankle ever since.  She reports ongoing erythema and very large abrasion noted along the left anterior shin.  Denies any exudates, bleeding.  Erythema does seem slightly better but not resolved.  Has been using Neosporin.   ROS: Per HPI  Allergies  Allergen Reactions   Sulfa Antibiotics Rash   Past Medical History:  Diagnosis Date   Anemia    Blood transfusion without reported diagnosis    Cataract    removed bilateral   Chronic cystitis    Diverticulitis    Heart murmur    Hyperlipidemia    Hypertension    Personal history of colonic polyps-adenomas 01/07/2012   2009 - 2 diminutive adenomas (prior polyps also) 01/07/2012 - 2 diminutive adenomas      Current Outpatient Medications:    Ascorbic Acid (VITAMIN C) 1000 MG tablet, Take 1,000 mg by mouth daily., Disp: , Rfl:    bisoprolol (ZEBETA) 5 MG tablet, Take 0.5 tablets (2.5 mg total) by mouth daily., Disp: 45 tablet, Rfl: 3   clobetasol ointment (TEMOVATE) 2.50 %, Apply 1 application topically 2 (two) times daily as needed (skin)., Disp: , Rfl:    Copper Gluconate (COPPER CAPS PO), Take 1 capsule by mouth daily. , Disp: , Rfl:    cyanocobalamin ([employer]S VITAMIN B12) 2000 MCG tablet, Take 1 tablet (2,000 mcg total) by mouth daily., Disp:  , Rfl:    hydrochlorothiazide (MICROZIDE) 12.5 MG capsule, Take 1 capsule (12.5 mg total) by mouth daily., Disp: 90 capsule, Rfl: 0   Magnesium 200 MG TABS, Take 1 tablet (200 mg total) by mouth daily., Disp: 30 tablet, Rfl: 6   Multiple Minerals-Vitamins (CALCIUM-MAGNESIUM-ZINC-D3 PO), Take 1 tablet by mouth daily., Disp: , Rfl:    pantoprazole (PROTONIX) 40 MG  tablet, 1 po BID x 8 weeks, then 1 po daily (Patient taking differently: Take 40 mg by mouth See admin instructions. Starting 9.15.22, take 1 tablet by mouth twice daily for 8 weeks, then 1 tablet by mouth once daily.), Disp: 90 tablet, Rfl: 1   rosuvastatin (CRESTOR) 10 MG tablet, TAKE 1 TABLET BY MOUTH AT  BEDTIME (Patient taking differently: Take 10 mg by mouth daily.), Disp: 90 tablet, Rfl: 3   traZODone (DESYREL) 50 MG tablet, TAKE 0.5-1 TABLETS BY MOUTH AT BEDTIME AS NEEDED FOR SLEEP., Disp: 90 tablet, Rfl: 0   vitamin E 400 UNIT capsule, Take 400 Units by mouth daily. , Disp: , Rfl:  Social History   Socioeconomic History   Marital status: Married    Spouse name: Not on file   Number of children: 1   Years of education: Not on file   Highest education level: Not on file  Occupational History   Occupation: retired  Tobacco Use   Smoking status: Former    Types: Cigarettes    Quit date: 12/23/1984    Years since quitting: 36.1   Smokeless tobacco: Never  Vaping Use   Vaping Use: Never used  Substance and Sexual Activity   Alcohol use: Yes    Comment: rare   Drug use: No   Sexual activity: Not on file  Other Topics Concern   Not  on file  Social History Narrative   Patient is married and retired and has 1 grown child   Social Determinants of Radio broadcast assistant Strain: Not on file  Food Insecurity: Not on file  Transportation Needs: Not on file  Physical Activity: Not on file  Stress: Not on file  Social Connections: Not on file  Intimate Partner Violence: Not on file   Family History  Problem Relation Age of Onset   Colon cancer Mother 33       80's   Breast cancer Sister    Asthma Brother    Colon polyps Neg Hx    Esophageal cancer Neg Hx    Rectal cancer Neg Hx    Stomach cancer Neg Hx     Objective: Office vital signs reviewed. BP 120/60   Pulse 75   Temp (!) 97.4 F (36.3 C)   Ht 5\' 5"  (1.651 m)   Wt 197 lb 9.6 oz (89.6 kg)   SpO2 97%    BMI 32.88 kg/m   Physical Examination:  General: Awake, alert, well nourished, No acute distress Skin: 4.5 vertical healing abrasion with surrounding erythema but no warmth or exudates noted on left anterior shin  Assessment/ Plan: 75 y.o. female   Infected abrasion - Plan: mupirocin ointment (BACTROBAN) 2 %  Will empirically treat with topical abx given erythema.  Home wound care instructions reviewed.  Reasons for reevaluation discussed.  She voiced good understanding.  No orders of the defined types were placed in this encounter.  No orders of the defined types were placed in this encounter.    [name], DO [place] [phone]

## 2021-02-21 ENCOUNTER — Other Ambulatory Visit: Payer: Self-pay | Admitting: Family Medicine

## 2021-02-21 ENCOUNTER — Ambulatory Visit (INDEPENDENT_AMBULATORY_CARE_PROVIDER_SITE_OTHER): Payer: Medicare Other | Admitting: Cardiology

## 2021-02-21 ENCOUNTER — Encounter: Payer: Self-pay | Admitting: Cardiology

## 2021-02-21 ENCOUNTER — Ambulatory Visit
Admission: RE | Admit: 2021-02-21 | Discharge: 2021-02-21 | Disposition: A | Discharge status: Home / Self Care | Payer: Medicare Other | Source: Ambulatory Visit | Attending: Family Medicine | Admitting: Family Medicine

## 2021-02-21 ENCOUNTER — Other Ambulatory Visit: Payer: Self-pay

## 2021-02-21 VITALS — BP 118/64 | HR 68 | Ht 65.5 in | Wt 197.0 lb

## 2021-02-21 DIAGNOSIS — I10 Essential (primary) hypertension: Secondary | ICD-10-CM

## 2021-02-21 DIAGNOSIS — I517 Cardiomegaly: Secondary | ICD-10-CM

## 2021-02-21 DIAGNOSIS — Z1231 Encounter for screening mammogram for malignant neoplasm of breast: Secondary | ICD-10-CM

## 2021-02-21 DIAGNOSIS — R0602 Shortness of breath: Secondary | ICD-10-CM

## 2021-02-21 NOTE — Patient Instructions (Signed)
Medication Instructions:  The current medical regimen is effective;  continue present plan and medications.  *If you need a refill on your cardiac medications before your next appointment, please call your pharmacy*  Follow-Up: At [facility], you and your health needs are our priority.  As part of our continuing mission to provide you with exceptional heart care, we have created designated Provider Care Teams.  These Care Teams include your primary Cardiologist (physician) and Advanced Practice Providers (APPs -  Physician Assistants and Nurse Practitioners) who all work together to provide you with the care you need, when you need it.  We recommend signing up for the patient portal called "MyChart".  Sign up information is provided on this After Visit Summary.  MyChart is used to connect with patients for Virtual Visits (Telemedicine).  Patients are able to view lab/test results, encounter notes, upcoming appointments, etc.  Non-urgent messages can be sent to your provider as well.   To learn more about what you can do with MyChart, go to [email or web address].    Your next appointment:   1 year(s)  The format for your next appointment:   In Person  Provider:   James Hochrein, MD   Thank you for choosing [facility]!!    

## 2021-02-22 ENCOUNTER — Other Ambulatory Visit: Payer: Self-pay | Admitting: *Deleted

## 2021-02-22 DIAGNOSIS — R609 Edema, unspecified: Secondary | ICD-10-CM

## 2021-02-22 MED ORDER — HYDROCHLOROTHIAZIDE 12.5 MG PO CAPS
12.5000 mg | ORAL_CAPSULE | Freq: Every day | ORAL | 0 refills | Status: DC
Start: 2021-02-22 — End: 2021-05-14

## 2021-03-06 ENCOUNTER — Other Ambulatory Visit: Payer: Medicare Other

## 2021-03-06 ENCOUNTER — Inpatient Hospital Stay (HOSPITAL_COMMUNITY): Payer: Medicare Other | Attending: Hematology

## 2021-03-06 DIAGNOSIS — K922 Gastrointestinal hemorrhage, unspecified: Secondary | ICD-10-CM | POA: Insufficient documentation

## 2021-03-06 DIAGNOSIS — E538 Deficiency of other specified B group vitamins: Secondary | ICD-10-CM | POA: Diagnosis not present

## 2021-03-06 DIAGNOSIS — D500 Iron deficiency anemia secondary to blood loss (chronic): Secondary | ICD-10-CM | POA: Diagnosis not present

## 2021-03-06 LAB — CBC WITH DIFFERENTIAL/PLATELET
Abs Immature Granulocytes: 0.02 10*3/uL (ref 0.00–0.07)
Basophils Absolute: 0.1 10*3/uL (ref 0.0–0.1)
Basophils Relative: 1 %
Eosinophils Absolute: 0.3 10*3/uL (ref 0.0–0.5)
Eosinophils Relative: 3 %
HCT: 39.4 % (ref 36.0–46.0)
Hemoglobin: 13.2 g/dL (ref 12.0–15.0)
Immature Granulocytes: 0 %
Lymphocytes Relative: 24 %
Lymphs Abs: 2.1 10*3/uL (ref 0.7–4.0)
MCH: 28.4 pg (ref 26.0–34.0)
MCHC: 33.5 g/dL (ref 30.0–36.0)
MCV: 84.9 fL (ref 80.0–100.0)
Monocytes Absolute: 0.9 10*3/uL (ref 0.1–1.0)
Monocytes Relative: 11 %
Neutro Abs: 5.1 10*3/uL (ref 1.7–7.7)
Neutrophils Relative %: 61 %
Platelets: 240 10*3/uL (ref 150–400)
RBC: 4.64 MIL/uL (ref 3.87–5.11)
RDW: 15.4 % (ref 11.5–15.5)
WBC: 8.4 10*3/uL (ref 4.0–10.5)
nRBC: 0 % (ref 0.0–0.2)

## 2021-03-06 LAB — FERRITIN: Ferritin: 36 ng/mL (ref 11–307)

## 2021-03-06 LAB — IRON AND TIBC
Iron: 69 ug/dL (ref 28–170)
Saturation Ratios: 19 % (ref 10.4–31.8)
TIBC: 366 ug/dL (ref 250–450)
UIBC: 297 ug/dL

## 2021-03-06 LAB — SAMPLE TO BLOOD BANK

## 2021-03-07 ENCOUNTER — Encounter: Payer: Self-pay | Admitting: Family Medicine

## 2021-03-07 ENCOUNTER — Ambulatory Visit (INDEPENDENT_AMBULATORY_CARE_PROVIDER_SITE_OTHER): Payer: Medicare Other | Admitting: Family Medicine

## 2021-03-07 VITALS — BP 125/69 | HR 72 | Temp 96.6°F | Ht 65.5 in | Wt 200.4 lb

## 2021-03-07 DIAGNOSIS — S80922D Unspecified superficial injury of left lower leg, subsequent encounter: Secondary | ICD-10-CM | POA: Diagnosis not present

## 2021-03-07 DIAGNOSIS — T148XXA Other injury of unspecified body region, initial encounter: Secondary | ICD-10-CM

## 2021-03-07 NOTE — Progress Notes (Signed)
Assessment & Plan:  1. Abrasion - healing appropriately, reassurance provided - continue current treatment plan - advised to return if becomes infected or stops healing   Follow up plan: Return if symptoms worsen or fail to improve.  Lucile Crater, NP Student  I personally was present during the history, physical exam, and medical decision-making activities of this service and have verified that the service and findings are accurately documented in the nurse practitioner student's note.  Hendricks Limes, MSN, APRN, FNP-C [facility]   Subjective:   Patient ID: Stacey Reyes, female    DOB: [birth year], 75 y.o.   MRN: [id number]  HPI: Stacey Reyes is a 75 y.o. female presenting on 03/07/2021 for infected abrasion (Re check left lower leg infected abrasion from visit on 11/29. )  Patient presents today for follow up of her wound on her left leg that occurred 3.5 weeks ago. She states she tripped at the beach which caused her to get an abrasion. She was seen on 11/29 and given an antibiotic cream to apply to the site and advised to keep clean. She wants to ensure that it is healing well since it is still fairly large.    ROS: Negative unless specifically indicated above in HPI.   Relevant past medical history reviewed and updated as indicated.   Allergies and medications reviewed and updated.   Current Outpatient Medications:    Ascorbic Acid (VITAMIN C) 1000 MG tablet, Take 1,000 mg by mouth daily., Disp: , Rfl:    bisoprolol (ZEBETA) 5 MG tablet, Take 0.5 tablets (2.5 mg total) by mouth daily., Disp: 45 tablet, Rfl: 3   clobetasol ointment (TEMOVATE) 1.74 %, Apply 1 application topically 2 (two) times daily as needed (skin)., Disp: , Rfl:    Copper Gluconate (COPPER CAPS PO), Take 1 capsule by mouth daily. , Disp: , Rfl:    cyanocobalamin ([employer]S VITAMIN B12) 2000 MCG tablet, Take 1 tablet (2,000 mcg total) by mouth daily., Disp:  , Rfl:     hydrochlorothiazide (MICROZIDE) 12.5 MG capsule, Take 1 capsule (12.5 mg total) by mouth daily., Disp: 90 capsule, Rfl: 0   Magnesium 200 MG TABS, Take 1 tablet (200 mg total) by mouth daily., Disp: 30 tablet, Rfl: 6   Multiple Minerals-Vitamins (CALCIUM-MAGNESIUM-ZINC-D3 PO), Take 1 tablet by mouth daily., Disp: , Rfl:    pantoprazole (PROTONIX) 40 MG tablet, 1 po BID x 8 weeks, then 1 po daily (Patient taking differently: Take 40 mg by mouth See admin instructions. Starting 9.15.22, take 1 tablet by mouth twice daily for 8 weeks, then 1 tablet by mouth once daily.), Disp: 90 tablet, Rfl: 1   rosuvastatin (CRESTOR) 10 MG tablet, TAKE 1 TABLET BY MOUTH AT  BEDTIME (Patient taking differently: Take 10 mg by mouth daily.), Disp: 90 tablet, Rfl: 3   traZODone (DESYREL) 50 MG tablet, TAKE 0.5-1 TABLETS BY MOUTH AT BEDTIME AS NEEDED FOR SLEEP., Disp: 90 tablet, Rfl: 0   vitamin E 400 UNIT capsule, Take 400 Units by mouth daily. , Disp: , Rfl:   Allergies  Allergen Reactions   Sulfa Antibiotics Rash    Objective:   BP 125/69    Pulse 72    Temp (!) 96.6 F (35.9 C) (Temporal)    Ht 5' 5.5" (1.664 m)    Wt 90.9 kg    BMI 32.84 kg/m    Physical Exam Vitals reviewed.  Constitutional:      General: She is not in acute distress.  Appearance: Normal appearance. She is not ill-appearing, toxic-appearing or diaphoretic.  HENT:     Head: Normocephalic and atraumatic.     Nose: Nose normal.     Mouth/Throat:     Mouth: Mucous membranes are moist.     Pharynx: Oropharynx is clear.  Eyes:     Extraocular Movements: Extraocular movements intact.     Conjunctiva/sclera: Conjunctivae normal.     Pupils: Pupils are equal, round, and reactive to light.  Pulmonary:     Effort: Pulmonary effort is normal. No respiratory distress.  Abdominal:     General: There is no distension.     Palpations: Abdomen is soft. There is no mass.  Musculoskeletal:        General: Normal range of motion.     Cervical  back: Normal range of motion.     Right lower leg: Edema present.     Left lower leg: Edema present.  Skin:    General: Skin is warm and dry.     Findings: Abrasion present.     Comments: Left leg abrasion scabbed over with new pink skin surrounding the wound, 5 cm in length x 1 cm width.   Neurological:     General: No focal deficit present.     Mental Status: She is alert and oriented to person, place, and time.     Motor: No weakness.     Gait: Gait normal.  Psychiatric:        Mood and Affect: Mood normal.        Behavior: Behavior normal.        Thought Content: Thought content normal.        Judgment: Judgment normal.

## 2021-03-08 ENCOUNTER — Telehealth: Payer: Self-pay | Admitting: Family Medicine

## 2021-03-08 DIAGNOSIS — Z23 Encounter for immunization: Secondary | ICD-10-CM | POA: Diagnosis not present

## 2021-03-08 NOTE — Telephone Encounter (Signed)
Called to schedule AWV

## 2021-03-12 NOTE — Progress Notes (Signed)
Virtual Visit via Telephone Note [facility]  I connected with Stacey Reyes  on 03/13/21  at  1:17 PM by telephone and verified that I am speaking with the correct person using two identifiers.  Location: Patient: Home Provider: [facility]   I discussed the limitations, risks, security and privacy concerns of performing an evaluation and management service by telephone and the availability of in person appointments. I also discussed with the patient that there may be a patient responsible charge related to this service. The patient expressed understanding and agreed to proceed.   REASON FOR VISIT:  Follow-up for iron deficiency   CURRENT THERAPY: Intermittent IV iron infusions (Last Feraheme on 12/25/2020 and 01/01/2021)   HISTORY OF PRESENT ILLNESS: [name] follows at our clinic due to iron deficiency anemia.  She was last evaluated via telemedicine visit by Tarri Abernethy PA-C on 01/12/2021.    At today's visit, she reports feeling very well.  She denies any recent hospitalizations, surgeries, or changes in her baseline health status.  She has not noticed any recent signs of blood in her bowel movements such as bright red blood in the toilet or dark black stool.  No epistaxis or hematemesis.  She reports that her energy is much better than it used to be, but that she still gets fatigued from time to time and has less stamina than she used to.  She denies any pica cravings, restless legs, headaches, chest pain, dyspnea on exertion, lightheadedness, or syncope.  She reports 85% energy and 100% appetite.  She is maintaining stable weight at this time.    OBSERVATIONS/OBJECTIVE: Review of Systems  Constitutional:  Negative for chills, diaphoresis, fever, malaise/fatigue and weight loss.  Respiratory:  Negative for cough and shortness of breath.   Cardiovascular:  Positive for leg swelling. Negative for chest pain and palpitations.   Gastrointestinal:  Negative for abdominal pain, blood in stool, melena, nausea and vomiting.  Neurological:  Negative for dizziness and headaches.    PHYSICAL EXAM (per limitations of virtual telephone visit): The patient is alert and oriented x 3, exhibiting adequate mentation, good mood, and ability to speak in full sentences and execute sound judgement.   ASSESSMENT & PLAN: 1.  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  No improvement despite taking iron tablet twice daily - Patient has had multiple hospitalizations within the past year for acute blood loss anemia, is followed closely by gastroenterology - She had two hospitalization in September 2022, and received a total of 8 units PRBC during these hospital stays - [place] (12/10/2020-12/15/2020): Presented to the ED with recurrent melena and Hgb 5.6.  EGD (12/11/2020) showed actively bleeding Dieulafoy lesion in third/fourth portion of duodenum.  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 - Most recent IV Feraheme on 12/25/2020 and 01/01/2021 -She has not noted any melena or other signs of bleeding since she was discharged from the hospital after correction of AVM   - She is feeling much better after blood transfusions, IV iron, and correction of bleeding AVM   - Most recent labs (03/06/2021): Hgb 13.2, iron saturation 19%, ferritin 36 - PLAN: Recommend additional IV iron with Feraheme x2. - Repeat labs and RTC in 12 weeks with office  visit  - Patient is aware of alarm symptoms that would prompt immediate medical attention.   2.  B12 deficiency - She is taking B12 tablet 1000 mcg daily - Most recent labs (10/30/2020): B12 874, methylmalonic acid 138 - PLAN: Continue B12  supplement.  We will recheck levels every 6 to 12 months.   FOLLOW UP INSTRUCTIONS: Feraheme x2 Labs and RTC in 3 months    I discussed the assessment and treatment plan with the patient. The patient was provided an opportunity to ask questions and all were answered. The patient agreed with the plan and demonstrated an understanding of the instructions.   The patient was advised to call back or seek an in-person evaluation if the symptoms worsen or if the condition fails to improve as anticipated.  I provided 13 minutes of non-face-to-face time during this encounter.   Harriett Rush, PA-C 03/13/21 1:34 PM

## 2021-03-13 ENCOUNTER — Other Ambulatory Visit: Payer: Self-pay

## 2021-03-13 ENCOUNTER — Inpatient Hospital Stay (HOSPITAL_BASED_OUTPATIENT_CLINIC_OR_DEPARTMENT_OTHER): Payer: Medicare Other | Admitting: Physician Assistant

## 2021-03-13 ENCOUNTER — Encounter (HOSPITAL_COMMUNITY): Payer: Self-pay | Admitting: Physician Assistant

## 2021-03-13 DIAGNOSIS — D500 Iron deficiency anemia secondary to blood loss (chronic): Secondary | ICD-10-CM

## 2021-03-13 DIAGNOSIS — E538 Deficiency of other specified B group vitamins: Secondary | ICD-10-CM | POA: Diagnosis not present

## 2021-03-15 ENCOUNTER — Inpatient Hospital Stay (HOSPITAL_COMMUNITY): Payer: Medicare Other

## 2021-03-15 ENCOUNTER — Other Ambulatory Visit: Payer: Self-pay

## 2021-03-15 ENCOUNTER — Other Ambulatory Visit: Payer: Self-pay | Admitting: Family Medicine

## 2021-03-15 VITALS — BP 132/74 | HR 70 | Temp 97.7°F | Resp 18

## 2021-03-15 DIAGNOSIS — D500 Iron deficiency anemia secondary to blood loss (chronic): Secondary | ICD-10-CM | POA: Diagnosis not present

## 2021-03-15 DIAGNOSIS — K922 Gastrointestinal hemorrhage, unspecified: Secondary | ICD-10-CM | POA: Diagnosis not present

## 2021-03-15 DIAGNOSIS — D509 Iron deficiency anemia, unspecified: Secondary | ICD-10-CM

## 2021-03-15 DIAGNOSIS — E538 Deficiency of other specified B group vitamins: Secondary | ICD-10-CM | POA: Diagnosis not present

## 2021-03-15 MED ORDER — ACETAMINOPHEN 325 MG PO TABS
650.0000 mg | ORAL_TABLET | Freq: Once | ORAL | Status: AC
  Administered 2021-03-15: 13:00:00 650 mg via ORAL
  Filled 2021-03-15: qty 2

## 2021-03-15 MED ORDER — LORATADINE 10 MG PO TABS
10.0000 mg | ORAL_TABLET | Freq: Once | ORAL | Status: AC
  Administered 2021-03-15: 13:00:00 10 mg via ORAL
  Filled 2021-03-15: qty 1

## 2021-03-15 MED ORDER — SODIUM CHLORIDE 0.9 % IV SOLN
510.0000 mg | Freq: Once | INTRAVENOUS | Status: AC
  Administered 2021-03-15: 14:00:00 510 mg via INTRAVENOUS
  Filled 2021-03-15: qty 510

## 2021-03-15 MED ORDER — SODIUM CHLORIDE 0.9 % IV SOLN: Freq: Once | INTRAVENOUS | Status: AC

## 2021-03-15 NOTE — Patient Instructions (Signed)
[facility]  Discharge Instructions: Thank you for choosing [facility] to provide your oncology and hematology care.  If you have a lab appointment with the Cancer Center, please come in thru the Main Entrance and check in at the main information desk.  Wear comfortable clothing and clothing appropriate for easy access to any Portacath or PICC line.   We strive to give you quality time with your provider. You may need to reschedule your appointment if you arrive late (15 or more minutes).  Arriving late affects you and other patients whose appointments are after yours.  Also, if you miss three or more appointments without notifying the office, you may be dismissed from the clinic at the provider's discretion.      For prescription refill requests, have your pharmacy contact our office and allow 72 hours for refills to be completed.        To help prevent nausea and vomiting after your treatment, we encourage you to take your nausea medication as directed.  BELOW ARE SYMPTOMS THAT SHOULD BE REPORTED IMMEDIATELY: *FEVER GREATER THAN 100.4 F (38 C) OR HIGHER *CHILLS OR SWEATING *NAUSEA AND VOMITING THAT IS NOT CONTROLLED WITH YOUR NAUSEA MEDICATION *UNUSUAL SHORTNESS OF BREATH *UNUSUAL BRUISING OR BLEEDING *URINARY PROBLEMS (pain or burning when urinating, or frequent urination) *BOWEL PROBLEMS (unusual diarrhea, constipation, pain near the anus) TENDERNESS IN MOUTH AND THROAT WITH OR WITHOUT PRESENCE OF ULCERS (sore throat, sores in mouth, or a toothache) UNUSUAL RASH, SWELLING OR PAIN  UNUSUAL VAGINAL DISCHARGE OR ITCHING   Items with * indicate a potential emergency and should be followed up as soon as possible or go to the Emergency Department if any problems should occur.  Please show the CHEMOTHERAPY ALERT CARD or IMMUNOTHERAPY ALERT CARD at check-in to the Emergency Department and triage nurse.  Should you have questions after your visit or need to cancel  or reschedule your appointment, please contact [facility] [phone]  and follow the prompts.  Office hours are 8:00 a.m. to 4:30 p.m. Monday - Friday. Please note that voicemails left after 4:00 p.m. may not be returned until the following business day.  We are closed weekends and major holidays. You have access to a nurse at all times for urgent questions. Please call the main number to the clinic [phone] and follow the prompts.  For any non-urgent questions, you may also contact your provider using MyChart. We now offer e-Visits for anyone 18 and older to request care online for non-urgent symptoms. For details visit [email or web address].   Also download the MyChart app! Go to the app store, search "MyChart", open the app, select [place], and log in with your MyChart username and password.  Due to Covid, a mask is required upon entering the hospital/clinic. If you do not have a mask, one will be given to you upon arrival. For doctor visits, patients may have 1 support person aged 18 or older with them. For treatment visits, patients cannot have anyone with them due to current Covid guidelines and our immunocompromised population.  

## 2021-03-15 NOTE — Progress Notes (Signed)
Iron infusion given per orders. Patient tolerated it well without problems. Vitals stable and discharged home from clinic ambulatory. Follow up as scheduled.  

## 2021-03-15 NOTE — Progress Notes (Signed)
Feraheme given today per MD orders. Tolerated infusion without adverse affects. Vital signs stable. No complaints at this time. Patient declined post 30 minute wait time post iron infusion. Patient teaching performed and understanding verbalized. Discharged from clinic ambulatory in stable condition. Alert and oriented x 3. F/U with [facility] as scheduled.

## 2021-03-21 ENCOUNTER — Ambulatory Visit (INDEPENDENT_AMBULATORY_CARE_PROVIDER_SITE_OTHER): Payer: Medicare Other | Admitting: *Deleted

## 2021-03-21 DIAGNOSIS — Z0000 Encounter for general adult medical examination without abnormal findings: Secondary | ICD-10-CM

## 2021-03-21 NOTE — Progress Notes (Signed)
MEDICARE ANNUAL WELLNESS VISIT  03/21/2021  Telephone Visit Disclaimer This Medicare AWV was conducted by telephone due to national recommendations for restrictions regarding the COVID-19 Pandemic (e.g. social distancing).  I verified, using two identifiers, that I am speaking with Stacey Reyes or their authorized healthcare agent. I discussed the limitations, risks, security, and privacy concerns of performing an evaluation and management service by telephone and the potential availability of an in-person appointment in the future. The patient expressed understanding and agreed to proceed.  Location of Patient: home Location of Provider (nurse):  office  Subjective:    Stacey Reyes is a 75 y.o. female patient of Stacey Norlander, DO who had a Medicare Annual Wellness Visit today via telephone. Stacey Reyes is Retired and lives with their spouse. she has one children. she reports that she is socially active and does interact with friends/family regularly. she is minimally physically active and enjoys dancing and travel..  Patient Care Team: Stacey Norlander, DO as PCP - General (Family Medicine) Stacey Breeding, MD as PCP - Cardiology (Cardiology) Stacey Jack, MD as Consulting Physician (Hematology)  Advanced Directives 03/21/2021 03/21/2021 03/13/2021 01/12/2021 01/01/2021 12/25/2020 12/11/2020  Does Patient Have a Medical Advance Directive? - - No No No No No  Type of Advance Directive - - - - - - -  Copy of Healthcare Power of Attorney in Chart? - - - - - - -  Would patient like information on creating a medical advance directive? Yes (MAU/Ambulatory/Procedural Areas - Information given) No - Patient declined No - Patient declined No - Patient declined No - Patient declined No - Patient declined No - Patient declined    Hospital Utilization Over the Past 12 Months: # of hospitalizations or ER visits: 3 # of surgeries: 0  Review of Systems    Patient  reports that her overall health is unchanged compared to last year.  History obtained from chart review  Patient Reported Readings (BP, Pulse, CBG, Weight, etc) none  Pain Assessment Pain : No/denies pain     Current Medications & Allergies (verified) Allergies as of 03/21/2021       Reactions   Sulfa Antibiotics Rash        Medication List        Accurate as of March 21, 2021  8:49 AM. If you have any questions, ask your nurse or doctor.          bisoprolol 5 MG tablet Commonly known as: ZEBETA Take 0.5 tablets (2.5 mg total) by mouth daily.   CALCIUM-MAGNESIUM-ZINC-D3 PO Take 1 tablet by mouth daily.   clobetasol ointment 0.05 % Commonly known as: TEMOVATE Apply 1 application topically 2 (two) times daily as needed (skin).   COPPER CAPS PO Take 1 capsule by mouth daily.   cyanocobalamin 2000 MCG tablet Commonly known as: [employer] VITAMIN B12 Take 1 tablet (2,000 mcg total) by mouth daily.   hydrochlorothiazide 12.5 MG capsule Commonly known as: Microzide Take 1 capsule (12.5 mg total) by mouth daily.   Magnesium 200 MG Tabs Take 1 tablet (200 mg total) by mouth daily.   pantoprazole 40 MG tablet Commonly known as: PROTONIX 1 po BID x 8 weeks, then 1 po daily What changed:  how much to take how to take this when to take this additional instructions   rosuvastatin 10 MG tablet Commonly known as: CRESTOR TAKE 1 TABLET BY MOUTH AT  BEDTIME   traZODone 50 MG tablet Commonly known as: DESYREL TAKE  0.5-1 TABLETS BY MOUTH AT BEDTIME AS NEEDED FOR SLEEP.   vitamin C 1000 MG tablet Take 1,000 mg by mouth daily.   vitamin E 180 MG (400 UNITS) capsule Take 400 Units by mouth daily.        History (reviewed): Past Medical History:  Diagnosis Date   Anemia    Blood transfusion without reported diagnosis    Cataract    removed bilateral   Chronic cystitis    Diverticulitis    Heart murmur    Hyperlipidemia    Hypertension    Personal  history of colonic polyps-adenomas 01/07/2012   2009 - 2 diminutive adenomas (prior polyps also) 01/07/2012 - 2 diminutive adenomas     Past Surgical History:  Procedure Laterality Date   BREAST BIOPSY Right    No Scar seen    COLONOSCOPY  multiple   ESOPHAGOGASTRODUODENOSCOPY (EGD) WITH PROPOFOL N/A 11/23/2019   Procedure: ESOPHAGOGASTRODUODENOSCOPY (EGD) WITH PROPOFOL;  Surgeon: Daneil Dolin, MD;  [facility];  Service: Endoscopy;  Laterality: N/A;   ESOPHAGOGASTRODUODENOSCOPY (EGD) WITH PROPOFOL N/A 10/20/2020   Procedure: ESOPHAGOGASTRODUODENOSCOPY (EGD) WITH PROPOFOL;  Surgeon: Eloise Harman, DO;  [facility];  Service: Endoscopy;  Laterality: N/A;   ESOPHAGOGASTRODUODENOSCOPY (EGD) WITH PROPOFOL N/A 12/05/2020   Procedure: ESOPHAGOGASTRODUODENOSCOPY (EGD) WITH PROPOFOL;  Surgeon: Rogene Houston, MD;  [facility];  Service: Endoscopy;  Laterality: N/A;  with enteroscopy   ESOPHAGOGASTRODUODENOSCOPY (EGD) WITH PROPOFOL N/A 12/11/2020   Procedure: ESOPHAGOGASTRODUODENOSCOPY (EGD) WITH PROPOFOL;  Surgeon: Eloise Harman, DO;  [facility];  Service: Endoscopy;  Laterality: N/A;   GIVENS CAPSULE STUDY N/A 11/23/2019   Procedure: GIVENS CAPSULE STUDY;  Surgeon: Daneil Dolin, MD;  [facility];  Service: Endoscopy;  Laterality: N/A;   GIVENS CAPSULE STUDY N/A 10/22/2020   Procedure: GIVENS CAPSULE STUDY;  Surgeon: Eloise Harman, DO;  [facility];  Service: Endoscopy;  Laterality: N/A;   HEMOSTASIS CLIP PLACEMENT  12/11/2020   Procedure: HEMOSTASIS CLIP PLACEMENT;  Surgeon: Eloise Harman, DO;  [facility];  Service: Endoscopy;;   IR ANGIOGRAM SELECTIVE EACH ADDITIONAL VESSEL  12/05/2020   IR ANGIOGRAM SELECTIVE EACH ADDITIONAL VESSEL  12/05/2020   IR ANGIOGRAM SELECTIVE EACH ADDITIONAL VESSEL  12/05/2020   IR ANGIOGRAM SELECTIVE EACH ADDITIONAL VESSEL  12/13/2020   IR ANGIOGRAM SELECTIVE EACH  ADDITIONAL VESSEL  12/13/2020   IR ANGIOGRAM VISCERAL SELECTIVE  12/05/2020   IR ANGIOGRAM VISCERAL SELECTIVE  12/13/2020   IR EMBO ART  VEN HEMORR LYMPH EXTRAV  INC GUIDE ROADMAPPING  12/05/2020   IR EMBO ART  VEN HEMORR LYMPH EXTRAV  INC GUIDE ROADMAPPING  12/13/2020   IR RADIOLOGIST EVAL & MGMT  01/11/2021   IR US GUIDE VASC ACCESS LEFT  12/13/2020   IR US GUIDE VASC ACCESS RIGHT  12/05/2020   Family History  Problem Relation Age of Onset   Colon cancer Mother 67       80's   Breast cancer Sister    Asthma Brother    Colon polyps Neg Hx    Esophageal cancer Neg Hx    Rectal cancer Neg Hx    Stomach cancer Neg Hx    Social History   Socioeconomic History   Marital status: Married    Spouse name: Not on file   Number of children: 1   Years of education: Not on file   Highest education level: Not on file  Occupational  History   Occupation: retired  Tobacco Use   Smoking status: Former    Types: Cigarettes    Quit date: 12/23/1984    Years since quitting: 36.2   Smokeless tobacco: Never  Vaping Use   Vaping Use: Never used  Substance and Sexual Activity   Alcohol use: Yes    Comment: rare   Drug use: No   Sexual activity: Not on file  Other Topics Concern   Not on file  Social History Narrative   Patient is married and retired and has 1 grown child   Social Determinants of Radio broadcast assistant Strain: Low Risk    Difficulty of Paying Living Expenses: Not hard at all  Food Insecurity: No Food Insecurity   Worried About Charity fundraiser in the Last Year: Never true   Arboriculturist in the Last Year: Never true  Transportation Needs: No Transportation Needs   Lack of Transportation (Medical): No   Lack of Transportation (Non-Medical): No  Physical Activity: Sufficiently Active   Days of Exercise per Week: 1 day   Minutes of Exercise per Session: 150+ min  Stress: No Stress Concern Present   Feeling of Stress : Only a little  Social Connections: Moderately  Integrated   Frequency of Communication with Friends and Family: More than three times a week   Frequency of Social Gatherings with Friends and Family: More than three times a week   Attends Religious Services: More than 4 times per year   Active Member of [employer] or Organizations: No   Attends Archivist Meetings: Never   Marital Status: Married    Activities of Daily Living In your present state of health, do you have any difficulty performing the following activities: 03/21/2021 12/10/2020  Hearing? N N  Vision? N N  Difficulty concentrating or making decisions? N N  Walking or climbing stairs? N N  Dressing or bathing? N N  Doing errands, shopping? N N  Preparing Food and eating ? N -  Using the Toilet? N -  In the past six months, have you accidently leaked urine? N -  Do you have problems with loss of bowel control? N -  Managing your Medications? N -  Managing your Finances? N -  Housekeeping or managing your Housekeeping? N -  Some recent data might be hidden    Patient Education/ Literacy How often do you need to have someone help you when you read instructions, pamphlets, or other written materials from your doctor or pharmacy?: 1 - Never What is the last grade level you completed in school?: 12  Exercise Current Exercise Habits: The patient does not participate in regular exercise at present, Exercise limited by: None identified  Diet Patient reports consuming 3 meals a day and 2 snack(s) a day Patient reports that her primary diet is: Regular Patient reports that she does have regular access to food.   Depression Screen PHQ 2/9 Scores 03/21/2021 03/07/2021 02/20/2021 01/22/2021 10/25/2020 10/19/2020 10/19/2020  PHQ - 2 Score 0 0 3 0 0 0 0  PHQ- 9 Score - 3 8 2 2  - -     Fall Risk Fall Risk  03/21/2021 03/07/2021 02/20/2021 01/22/2021 10/25/2020  Falls in the past year? 0 0 0 0 0  Follow up - - - - Falls evaluation completed     Objective:  Stacey Reyes seemed alert and oriented and she participated appropriately during our telephone visit.  Blood Pressure  Weight BMI  BP Readings from Last 3 Encounters:  03/15/21 132/74  03/07/21 125/69  02/21/21 118/64   Wt Readings from Last 3 Encounters:  03/07/21 200 lb 6.4 oz (90.9 kg)  02/21/21 197 lb (89.4 kg)  02/20/21 197 lb 9.6 oz (89.6 kg)   BMI Readings from Last 1 Encounters:  03/07/21 32.84 kg/m    *Unable to obtain current vital signs, weight, and BMI due to telephone visit type  Hearing/Vision  Joycelyn Schmid did not seem to have difficulty with hearing/understanding during the telephone conversation Reports that she has had a formal eye exam by an eye care professional within the past year Reports that she has not had a formal hearing evaluation within the past year *Unable to fully assess hearing and vision during telephone visit type  Cognitive Function: 6CIT Screen 03/21/2021  What Year? 0 points  What month? 0 points  What time? 0 points  Count back from 20 0 points  Months in reverse 0 points  Repeat phrase 0 points  Total Score 0   (Normal:0-7, Significant for Dysfunction: >8)  Normal Cognitive Function Screening: Yes   Immunization & Health Maintenance Record Immunization History  Administered Date(s) Administered   Fluad Quad(high Dose 65+) 11/23/2018, 12/14/2019, 12/05/2020   Influenza Inj Mdck Quad Pf 01/07/2017   Influenza-Unspecified 01/01/2016, 01/07/2017   Moderna Sars-Covid-2 Vaccination 04/30/2019, 05/29/2019, 01/19/2020   Pneumococcal Conjugate-13 03/31/2019   Pneumococcal Polysaccharide-23 02/27/2010, 11/08/2013   Tdap 01/29/2012   Zoster Recombinat (Shingrix) 08/01/2016, 10/31/2016    Health Maintenance  Topic Date Due   COVID-19 Vaccine (4 - Booster for Moderna series) 02/07/2022 (Originally 03/15/2020)   TETANUS/TDAP  01/28/2022   COLONOSCOPY (Pts 45-80yrs Insurance coverage will need to be confirmed)  01/25/2023   Pneumonia Vaccine 57+  Years old  Completed   INFLUENZA VACCINE  Completed   DEXA SCAN  Completed   Hepatitis C Screening  Completed   Zoster Vaccines- Shingrix  Completed   HPV VACCINES  Aged Out       Assessment  This is a routine wellness examination for [place] Maintenance: Due or Overdue There are no preventive care reminders to display for this patient.  Stacey Reyes does not need a referral for Community Assistance: Care Management:   no Social Work:    no Prescription Assistance:  no Nutrition/Diabetes Education:  no   Plan:  Personalized Goals  Goals Addressed             This Visit's Progress    Cut out extra servings       DIET - INCREASE WATER INTAKE       Weight < 180 lb (81.647 kg)       Not on track 2022       Personalized Health Maintenance & Screening Recommendations  Up to date  Lung Cancer Screening Recommended: no (Low Dose CT Chest recommended if Age 39-80 years, 30 pack-year currently smoking OR have quit w/in past 15 years) Hepatitis C Screening recommended: no HIV Screening recommended: no  Advanced Directives: Written information  was mailed per patient's request.  Referrals & Orders No orders of the defined types were placed in this encounter.   Follow-up Plan Follow-up with Stacey Norlander, DO as planned. Pt is up to date with all health maintenance. Pt has no trouble with hearing. Vision is great since cataract surgery, she does get eye exams yearly. Advanced directives information mailed per patients request. Pt is independent with all ADL's.  Pt is active and social. She goes dancing once a week. AVS mailed to pt.    I have personally reviewed and noted the following in the patients chart:   Medical and social history Use of alcohol, tobacco or illicit drugs  Current medications and supplements Functional ability and status Nutritional status Physical activity Advanced directives List of other  physicians Hospitalizations, surgeries, and ER visits in previous 12 months Vitals Screenings to include cognitive, depression, and falls Referrals and appointments  In addition, I have reviewed and discussed with Stacey Reyes certain preventive protocols, quality metrics, and best practice recommendations. A written personalized care plan for preventive services as well as general preventive health recommendations is available and can be mailed to the patient at her request.      Rana Snare, LPN  [phone]

## 2021-03-21 NOTE — Patient Instructions (Signed)
°  Stacey Reyes , Thank you for taking time to come for your Medicare Wellness Visit. I appreciate your ongoing commitment to your health goals. Please review the following plan we discussed and let me know if I can assist you in the future.   These are the goals we discussed:  Goals      Cut out extra servings     DIET - INCREASE WATER INTAKE     Weight < 180 lb (81.647 kg)     Not on track 2022        This is a list of the screening recommended for you and due dates:  Health Maintenance  Topic Date Due   COVID-19 Vaccine (4 - Booster for Moderna series) 02/07/2022*   Tetanus Vaccine  01/28/2022   Colon Cancer Screening  01/25/2023   Pneumonia Vaccine  Completed   Flu Shot  Completed   DEXA scan (bone density measurement)  Completed   Hepatitis C Screening: USPSTF Recommendation to screen - Ages 69-79 yo.  Completed   Zoster (Shingles) Vaccine  Completed   HPV Vaccine  Aged Out  *Topic was postponed. The date shown is not the original due date.

## 2021-03-22 ENCOUNTER — Inpatient Hospital Stay (HOSPITAL_COMMUNITY): Payer: Medicare Other

## 2021-03-22 ENCOUNTER — Other Ambulatory Visit: Payer: Self-pay

## 2021-03-22 VITALS — BP 129/60 | HR 64 | Temp 98.4°F | Resp 18

## 2021-03-22 DIAGNOSIS — D500 Iron deficiency anemia secondary to blood loss (chronic): Secondary | ICD-10-CM | POA: Diagnosis not present

## 2021-03-22 DIAGNOSIS — K922 Gastrointestinal hemorrhage, unspecified: Secondary | ICD-10-CM | POA: Diagnosis not present

## 2021-03-22 DIAGNOSIS — D509 Iron deficiency anemia, unspecified: Secondary | ICD-10-CM

## 2021-03-22 DIAGNOSIS — E538 Deficiency of other specified B group vitamins: Secondary | ICD-10-CM | POA: Diagnosis not present

## 2021-03-22 MED ORDER — LORATADINE 10 MG PO TABS: 10.0000 mg | ORAL_TABLET | Freq: Once | ORAL | Status: DC

## 2021-03-22 MED ORDER — SODIUM CHLORIDE 0.9 % IV SOLN: Freq: Once | INTRAVENOUS | Status: AC

## 2021-03-22 MED ORDER — ACETAMINOPHEN 325 MG PO TABS: 650.0000 mg | ORAL_TABLET | Freq: Once | ORAL | Status: DC

## 2021-03-22 MED ORDER — SODIUM CHLORIDE 0.9 % IV SOLN
510.0000 mg | Freq: Once | INTRAVENOUS | Status: AC
  Administered 2021-03-22: 14:00:00 510 mg via INTRAVENOUS
  Filled 2021-03-22: qty 17

## 2021-03-22 NOTE — Patient Instructions (Signed)
[facility]  Discharge Instructions: °Thank you for choosing [facility] to provide your oncology and hematology care.  °If you have a lab appointment with the Cancer Center, please come in thru the Main Entrance and check in at the main information desk. ° °Wear comfortable clothing and clothing appropriate for easy access to any Portacath or PICC line.  ° °We strive to give you quality time with your provider. You may need to reschedule your appointment if you arrive late (15 or more minutes).  Arriving late affects you and other patients whose appointments are after yours.  Also, if you miss three or more appointments without notifying the office, you may be dismissed from the clinic at the provider’s discretion.    °  °For prescription refill requests, have your pharmacy contact our office and allow 72 hours for refills to be completed.   ° °Today you received the following chemotherapy and/or immunotherapy agents Feraheme    °  °To help prevent nausea and vomiting after your treatment, we encourage you to take your nausea medication as directed. ° °BELOW ARE SYMPTOMS THAT SHOULD BE REPORTED IMMEDIATELY: °*FEVER GREATER THAN 100.4 F (38 °C) OR HIGHER °*CHILLS OR SWEATING °*NAUSEA AND VOMITING THAT IS NOT CONTROLLED WITH YOUR NAUSEA MEDICATION °*UNUSUAL SHORTNESS OF BREATH °*UNUSUAL BRUISING OR BLEEDING °*URINARY PROBLEMS (pain or burning when urinating, or frequent urination) °*BOWEL PROBLEMS (unusual diarrhea, constipation, pain near the anus) °TENDERNESS IN MOUTH AND THROAT WITH OR WITHOUT PRESENCE OF ULCERS (sore throat, sores in mouth, or a toothache) °UNUSUAL RASH, SWELLING OR PAIN  °UNUSUAL VAGINAL DISCHARGE OR ITCHING  ° °Items with * indicate a potential emergency and should be followed up as soon as possible or go to the Emergency Department if any problems should occur. ° °Please show the CHEMOTHERAPY ALERT CARD or IMMUNOTHERAPY ALERT CARD at check-in to the Emergency  Department and triage nurse. ° °Should you have questions after your visit or need to cancel or reschedule your appointment, please contact [facility] [phone]  and follow the prompts.  Office hours are 8:00 a.m. to 4:30 p.m. Monday - Friday. Please note that voicemails left after 4:00 p.m. may not be returned until the following business day.  We are closed weekends and major holidays. You have access to a nurse at all times for urgent questions. Please call the main number to the clinic [phone] and follow the prompts. ° °For any non-urgent questions, you may also contact your provider using MyChart. We now offer e-Visits for anyone 18 and older to request care online for non-urgent symptoms. For details visit [email or web address]. °  °Also download the MyChart app! Go to the app store, search "MyChart", open the app, select [facility], and log in with your MyChart username and password. ° °Due to Covid, a mask is required upon entering the hospital/clinic. If you do not have a mask, one will be given to you upon arrival. For doctor visits, patients may have 1 support person aged 18 or older with them. For treatment visits, patients cannot have anyone with them due to current Covid guidelines and our immunocompromised population.  °

## 2021-03-22 NOTE — Progress Notes (Signed)
Patient presents today for Feraheme infusion per providers order.  Vital signs WNL.  Patient has no new complaints at this time.    Peripheral IV started and blood return noted pre and post infusion.  Feraheme infusion  given today per MD orders.  Stable during infusion without adverse affects.  Vital signs stable.  No complaints at this time.  Discharge from clinic ambulatory in stable condition.  Alert and oriented X 3.  Follow up with  Cancer Center as scheduled.  

## 2021-03-27 ENCOUNTER — Other Ambulatory Visit (HOSPITAL_COMMUNITY): Payer: Self-pay

## 2021-03-27 DIAGNOSIS — E559 Vitamin D deficiency, unspecified: Secondary | ICD-10-CM

## 2021-04-02 ENCOUNTER — Other Ambulatory Visit: Payer: Self-pay | Admitting: Family Medicine

## 2021-04-02 DIAGNOSIS — G4700 Insomnia, unspecified: Secondary | ICD-10-CM

## 2021-04-02 MED ORDER — TRAZODONE HCL 50 MG PO TABS: 50.0000 mg | ORAL_TABLET | Freq: Every evening | ORAL | 3 refills | Status: DC | PRN

## 2021-04-19 ENCOUNTER — Other Ambulatory Visit: Payer: Self-pay | Admitting: Family Medicine

## 2021-04-19 DIAGNOSIS — R609 Edema, unspecified: Secondary | ICD-10-CM

## 2021-05-11 ENCOUNTER — Other Ambulatory Visit: Payer: Self-pay | Admitting: Family Medicine

## 2021-05-11 DIAGNOSIS — R609 Edema, unspecified: Secondary | ICD-10-CM

## 2021-06-05 ENCOUNTER — Inpatient Hospital Stay (HOSPITAL_COMMUNITY): Payer: Medicare Other | Attending: Hematology

## 2021-06-05 DIAGNOSIS — E538 Deficiency of other specified B group vitamins: Secondary | ICD-10-CM | POA: Insufficient documentation

## 2021-06-05 DIAGNOSIS — D500 Iron deficiency anemia secondary to blood loss (chronic): Secondary | ICD-10-CM | POA: Insufficient documentation

## 2021-06-05 DIAGNOSIS — Z800 Family history of malignant neoplasm of digestive organs: Secondary | ICD-10-CM | POA: Insufficient documentation

## 2021-06-05 DIAGNOSIS — Z87891 Personal history of nicotine dependence: Secondary | ICD-10-CM | POA: Insufficient documentation

## 2021-06-05 DIAGNOSIS — Z803 Family history of malignant neoplasm of breast: Secondary | ICD-10-CM | POA: Insufficient documentation

## 2021-06-05 LAB — CBC WITH DIFFERENTIAL/PLATELET
Abs Immature Granulocytes: 0.01 10*3/uL (ref 0.00–0.07)
Basophils Absolute: 0.1 10*3/uL (ref 0.0–0.1)
Basophils Relative: 1 %
Eosinophils Absolute: 0.2 10*3/uL (ref 0.0–0.5)
Eosinophils Relative: 4 %
HCT: 40.2 % (ref 36.0–46.0)
Hemoglobin: 13.5 g/dL (ref 12.0–15.0)
Immature Granulocytes: 0 %
Lymphocytes Relative: 26 %
Lymphs Abs: 1.8 10*3/uL (ref 0.7–4.0)
MCH: 30.6 pg (ref 26.0–34.0)
MCHC: 33.6 g/dL (ref 30.0–36.0)
MCV: 91.2 fL (ref 80.0–100.0)
Monocytes Absolute: 0.8 10*3/uL (ref 0.1–1.0)
Monocytes Relative: 11 %
Neutro Abs: 4 10*3/uL (ref 1.7–7.7)
Neutrophils Relative %: 58 %
Platelets: 216 10*3/uL (ref 150–400)
RBC: 4.41 MIL/uL (ref 3.87–5.11)
RDW: 12.8 % (ref 11.5–15.5)
WBC: 6.9 10*3/uL (ref 4.0–10.5)
nRBC: 0 % (ref 0.0–0.2)

## 2021-06-05 LAB — IRON AND TIBC
Iron: 101 ug/dL (ref 28–170)
Saturation Ratios: 30 % (ref 10.4–31.8)
TIBC: 337 ug/dL (ref 250–450)
UIBC: 236 ug/dL

## 2021-06-05 LAB — FERRITIN: Ferritin: 68 ng/mL (ref 11–307)

## 2021-06-06 ENCOUNTER — Other Ambulatory Visit: Payer: Self-pay | Admitting: Family Medicine

## 2021-06-07 NOTE — Telephone Encounter (Signed)
Patient scheduled to see PCP on 3/20 for check up. ?

## 2021-06-07 NOTE — Telephone Encounter (Signed)
[facility]. NTBS no chronic fu in 2022. Mail order NOT sent ?

## 2021-06-08 ENCOUNTER — Other Ambulatory Visit: Payer: Self-pay

## 2021-06-08 DIAGNOSIS — Z780 Asymptomatic menopausal state: Secondary | ICD-10-CM

## 2021-06-11 ENCOUNTER — Ambulatory Visit (INDEPENDENT_AMBULATORY_CARE_PROVIDER_SITE_OTHER): Payer: Medicare Other | Admitting: Family Medicine

## 2021-06-11 ENCOUNTER — Ambulatory Visit (INDEPENDENT_AMBULATORY_CARE_PROVIDER_SITE_OTHER): Payer: Medicare Other

## 2021-06-11 ENCOUNTER — Encounter: Payer: Self-pay | Admitting: Family Medicine

## 2021-06-11 VITALS — BP 113/66 | HR 69 | Temp 98.3°F | Resp 20 | Ht 65.5 in | Wt 204.0 lb

## 2021-06-11 DIAGNOSIS — Z780 Asymptomatic menopausal state: Secondary | ICD-10-CM | POA: Diagnosis not present

## 2021-06-11 DIAGNOSIS — E782 Mixed hyperlipidemia: Secondary | ICD-10-CM

## 2021-06-11 DIAGNOSIS — J069 Acute upper respiratory infection, unspecified: Secondary | ICD-10-CM

## 2021-06-11 DIAGNOSIS — I10 Essential (primary) hypertension: Secondary | ICD-10-CM

## 2021-06-11 DIAGNOSIS — E559 Vitamin D deficiency, unspecified: Secondary | ICD-10-CM

## 2021-06-11 DIAGNOSIS — R609 Edema, unspecified: Secondary | ICD-10-CM | POA: Diagnosis not present

## 2021-06-11 MED ORDER — ROSUVASTATIN CALCIUM 10 MG PO TABS: 10.0000 mg | ORAL_TABLET | Freq: Every day | ORAL | 3 refills | Status: DC

## 2021-06-11 MED ORDER — LEVOCETIRIZINE DIHYDROCHLORIDE 5 MG PO TABS: 5.0000 mg | ORAL_TABLET | Freq: Every evening | ORAL | 0 refills | Status: DC | PRN

## 2021-06-11 MED ORDER — HYDROCHLOROTHIAZIDE 12.5 MG PO CAPS: 12.5000 mg | ORAL_CAPSULE | Freq: Every day | ORAL | 3 refills | Status: DC

## 2021-06-11 MED ORDER — BENZONATATE 100 MG PO CAPS: 100.0000 mg | ORAL_CAPSULE | Freq: Three times a day (TID) | ORAL | 0 refills | Status: DC | PRN

## 2021-06-11 NOTE — Progress Notes (Signed)
? ?[facility] ?[address] ?[place], [place] [address] ? ? ?CLINIC:  ?Medical Oncology/Hematology ? ?PCP:  ?Stacey Norlander, DO ?[address] ?[place] [address] ?[phone] ? ? ?REASON FOR VISIT:  ?Follow-up for iron deficiency ?  ?CURRENT THERAPY: Intermittent IV iron infusions (Last Feraheme on 03/15/2021 and 03/22/2021) ?  ?HISTORY OF PRESENT ILLNESS: ?[name] follows at our clinic due to iron deficiency anemia.  She was last evaluated via telemedicine visit by Stacey Abernethy PA-C on 03/13/2021. ? ?At today's visit, she reports feeling fairly well.  No recent hospitalizations, surgeries, or changes in baseline health status. ? ?She has not noticed any recent signs of bleeding such as hematemesis, hematochezia, melena, or epistaxis.  She reports that her energy has been good and she denies any major fatigue. ?Her dyspnea on exertion is much improved, and she only notices dyspnea after significant exertion.  She denies any chest pain, lightheadedness, syncope, pica, restless legs, or headaches. ? ?She has 80% energy and 100% appetite. She endorses that she is maintaining a stable weight. ? ? ?REVIEW OF SYSTEMS:  ?Review of Systems  ?Constitutional:  Negative for appetite change, chills, diaphoresis, fatigue, fever and unexpected weight change.  ?HENT:   Negative for lump/mass and nosebleeds.   ?Eyes:  Negative for eye problems.  ?Respiratory:  Positive for shortness of breath (with exertion). Negative for cough and hemoptysis.   ?Cardiovascular:  Negative for chest pain, leg swelling and palpitations.  ?Gastrointestinal:  Negative for abdominal pain, blood in stool, constipation, diarrhea, nausea and vomiting.  ?Genitourinary:  Negative for hematuria.   ?Musculoskeletal:  Positive for arthralgias.  ?Skin: Negative.   ?Neurological:  Negative for dizziness, headaches and light-headedness.  ?Hematological:  Does not bruise/bleed easily.  ?Psychiatric/Behavioral:  Positive for  sleep disturbance.    ? ? ?PAST MEDICAL/SURGICAL HISTORY:  ?Past Medical History:  ?Diagnosis Date  ? Anemia   ? Blood transfusion without reported diagnosis   ? Cataract   ? removed bilateral  ? Chronic cystitis   ? Diverticulitis   ? Heart murmur   ? Hyperlipidemia   ? Hypertension   ? Personal history of colonic polyps-adenomas 01/07/2012  ? 2009 - 2 diminutive adenomas (prior polyps also) 01/07/2012 - 2 diminutive adenomas    ? ?Past Surgical History:  ?Procedure Laterality Date  ? BREAST BIOPSY Right   ? No Scar seen   ? COLONOSCOPY  multiple  ? ESOPHAGOGASTRODUODENOSCOPY (EGD) WITH PROPOFOL N/A 11/23/2019  ? Procedure: ESOPHAGOGASTRODUODENOSCOPY (EGD) WITH PROPOFOL;  Surgeon: Daneil Dolin, MD;  [facility];  Service: Endoscopy;  Laterality: N/A;  ? ESOPHAGOGASTRODUODENOSCOPY (EGD) WITH PROPOFOL N/A 10/20/2020  ? Procedure: ESOPHAGOGASTRODUODENOSCOPY (EGD) WITH PROPOFOL;  Surgeon: Eloise Harman, DO;  [facility];  Service: Endoscopy;  Laterality: N/A;  ? ESOPHAGOGASTRODUODENOSCOPY (EGD) WITH PROPOFOL N/A 12/05/2020  ? Procedure: ESOPHAGOGASTRODUODENOSCOPY (EGD) WITH PROPOFOL;  Surgeon: Rogene Houston, MD;  [facility];  Service: Endoscopy;  Laterality: N/A;  with enteroscopy  ? ESOPHAGOGASTRODUODENOSCOPY (EGD) WITH PROPOFOL N/A 12/11/2020  ? Procedure: ESOPHAGOGASTRODUODENOSCOPY (EGD) WITH PROPOFOL;  Surgeon: Eloise Harman, DO;  [facility];  Service: Endoscopy;  Laterality: N/A;  ? GIVENS CAPSULE STUDY N/A 11/23/2019  ? Procedure: GIVENS CAPSULE STUDY;  Surgeon: Daneil Dolin, MD;  [facility];  Service: Endoscopy;  Laterality: N/A;  ? GIVENS CAPSULE STUDY N/A 10/22/2020  ? Procedure: GIVENS CAPSULE STUDY;  Surgeon: Eloise Harman, DO;  [facility];  Service: Endoscopy;  Laterality: N/A;  ? HEMOSTASIS CLIP PLACEMENT  12/11/2020  ? Procedure: HEMOSTASIS CLIP PLACEMENT;  Surgeon: Eloise Harman, DO;  [facility];   Service: Endoscopy;;  ? IR ANGIOGRAM SELECTIVE EACH ADDITIONAL VESSEL  12/05/2020  ? IR ANGIOGRAM SELECTIVE EACH ADDITIONAL VESSEL  12/05/2020  ? IR ANGIOGRAM SELECTIVE EACH ADDITIONAL VESSEL  12/05/2020  ? IR ANGIOGRAM SELECTIVE EACH ADDITIONAL VESSEL  12/13/2020  ? IR ANGIOGRAM SELECTIVE EACH ADDITIONAL VESSEL  12/13/2020  ? IR ANGIOGRAM VISCERAL SELECTIVE  12/05/2020  ? IR ANGIOGRAM VISCERAL SELECTIVE  12/13/2020  ? IR EMBO ART  VEN HEMORR LYMPH EXTRAV  INC GUIDE ROADMAPPING  12/05/2020  ? IR EMBO ART  VEN HEMORR LYMPH EXTRAV  INC GUIDE ROADMAPPING  12/13/2020  ? IR RADIOLOGIST EVAL & MGMT  01/11/2021  ? IR US GUIDE VASC ACCESS LEFT  12/13/2020  ? IR US GUIDE VASC ACCESS RIGHT  12/05/2020  ? ? ? ?SOCIAL HISTORY:  ?Social History  ? ?Socioeconomic History  ? Marital status: Married  ?  Spouse name: Not on file  ? Number of children: 1  ? Years of education: Not on file  ? Highest education level: Not on file  ?Occupational History  ? Occupation: retired  ?Tobacco Use  ? Smoking status: Former  ?  Types: Cigarettes  ?  Quit date: 12/23/1984  ?  Years since quitting: 36.4  ? Smokeless tobacco: Never  ?Vaping Use  ? Vaping Use: Never used  ?Substance and Sexual Activity  ? Alcohol use: Yes  ?  Comment: rare  ? Drug use: No  ? Sexual activity: Not on file  ?Other Topics Concern  ? Not on file  ?Social History Narrative  ? Patient is married and retired and has 1 grown child  ? ?Social Determinants of Health  ? ?Financial Resource Strain: Low Risk   ? Difficulty of Paying Living Expenses: Not hard at all  ?Food Insecurity: No Food Insecurity  ? Worried About Charity fundraiser in the Last Year: Never true  ? Ran Out of Food in the Last Year: Never true  ?Transportation Needs: No Transportation Needs  ? Lack of Transportation (Medical): No  ? Lack of Transportation (Non-Medical): No  ?Physical Activity: Sufficiently Active  ? Days of Exercise per Week: 1 day  ? Minutes of Exercise per Session: 150+ min  ?Stress: No Stress Concern  Present  ? Feeling of Stress : Only a little  ?Social Connections: Moderately Integrated  ? Frequency of Communication with Friends and Family: More than three times a week  ? Frequency of Social Gatherings with Friends and Family: More than three times a week  ? Attends Religious Services: More than 4 times per year  ? Active Member of Clubs or Organizations: No  ? Attends Archivist Meetings: Never  ? Marital Status: Married  ?Intimate Partner Violence: Not At Risk  ? Fear of Current or Ex-Partner: No  ? Emotionally Abused: No  ? Physically Abused: No  ? Sexually Abused: No  ? ? ?FAMILY HISTORY:  ?Family History  ?Problem Relation Age of Onset  ? Colon cancer Mother 93  ?     46's  ? Breast cancer Sister   ? Asthma Brother   ? Colon polyps Neg Hx   ? Esophageal cancer Neg Hx   ? Rectal cancer Neg Hx   ? Stomach cancer Neg Hx   ? ? ?CURRENT MEDICATIONS:  ?Outpatient  Encounter Medications as of 06/12/2021  ?Medication Sig  ? Ascorbic Acid (VITAMIN C) 1000 MG tablet Take 1,000 mg by mouth daily.  ? benzonatate (TESSALON PERLES) 100 MG capsule Take 1 capsule (100 mg total) by mouth 3 (three) times daily as needed for cough.  ? bisoprolol (ZEBETA) 5 MG tablet Take 0.5 tablets (2.5 mg total) by mouth daily.  ? clobetasol ointment (TEMOVATE) 3.71 % Apply 1 application topically 2 (two) times daily as needed (skin).  ? Copper Gluconate (COPPER CAPS PO) Take 1 capsule by mouth daily.   ? hydrochlorothiazide (MICROZIDE) 12.5 MG capsule Take 1 capsule (12.5 mg total) by mouth daily.  ? levocetirizine (XYZAL) 5 MG tablet Take 1 tablet (5 mg total) by mouth at bedtime as needed for allergies (drainage).  ? Magnesium 200 MG TABS Take 1 tablet (200 mg total) by mouth daily.  ? Multiple Minerals-Vitamins (CALCIUM-MAGNESIUM-ZINC-D3 PO) Take 1 tablet by mouth daily.  ? pantoprazole (PROTONIX) 40 MG tablet 1 po BID x 8 weeks, then 1 po daily (Patient taking differently: Take 40 mg by mouth See admin instructions. Starting  9.15.22, take 1 tablet by mouth twice daily for 8 weeks, then 1 tablet by mouth once daily.)  ? rosuvastatin (CRESTOR) 10 MG tablet Take 1 tablet (10 mg total) by mouth at bedtime.  ? traZODone (DESYREL)

## 2021-06-11 NOTE — Progress Notes (Signed)
? ?Subjective: ?CC: Chronic follow-up ?PCP: [name], DO ?[id number] H Werst is a 76 y.o. female presenting to clinic today for: ? ?1.  Hypertension, hyperlipidemia ?Patient is compliant with Crestor, hydrochlorothiazide and half dose of bisoprolol.  No chest pain, shortness of breath, dizziness reported.  She does get edema of the extremities without the hydrochlorothiazide. ? ?2.  Vitamin D deficiency ?Patient was previously treated with high-dose vitamin D.  She had a bunch of labs obtained by her specialist and will be reviewing those tomorrow with them.  Unsure vitamin D was amongst those.  Has not had a DEXA and would like to get that bone density done today. ? ?3.  Cough ?Patient reports that her granddaughter had a URI/cold over the weekend.  She has had very close exposure all weekend.  No reports of fevers.  She has a mild cough but phlegm is clear.  No purulent drainage reported.  She has not used anything for this yet. ? ? ?ROS: Per HPI ? ?Allergies  ?Allergen Reactions  ? Sulfa Antibiotics Rash  ? ?Past Medical History:  ?Diagnosis Date  ? Anemia   ? Blood transfusion without reported diagnosis   ? Cataract   ? removed bilateral  ? Chronic cystitis   ? Diverticulitis   ? Heart murmur   ? Hyperlipidemia   ? Hypertension   ? Personal history of colonic polyps-adenomas 01/07/2012  ? 2009 - 2 diminutive adenomas (prior polyps also) 01/07/2012 - 2 diminutive adenomas    ? ? ?Current Outpatient Medications:  ?  Ascorbic Acid (VITAMIN C) 1000 MG tablet, Take 1,000 mg by mouth daily., Disp: , Rfl:  ?  bisoprolol (ZEBETA) 5 MG tablet, Take 0.5 tablets (2.5 mg total) by mouth daily., Disp: 45 tablet, Rfl: 3 ?  clobetasol ointment (TEMOVATE) 5.46 %, Apply 1 application topically 2 (two) times daily as needed (skin)., Disp: , Rfl:  ?  Copper Gluconate (COPPER CAPS PO), Take 1 capsule by mouth daily. , Disp: , Rfl:  ?  cyanocobalamin ([employer] VITAMIN B12) 2000 MCG tablet, Take 1 tablet (2,000 mcg total)  by mouth daily., Disp:  , Rfl:  ?  hydrochlorothiazide (MICROZIDE) 12.5 MG capsule, Take 1 capsule (12.5 mg total) by mouth daily. (NEEDS TO BE SEEN BEFORE NEXT REFILL), Disp: 90 capsule, Rfl: 0 ?  Magnesium 200 MG TABS, Take 1 tablet (200 mg total) by mouth daily., Disp: 30 tablet, Rfl: 6 ?  Multiple Minerals-Vitamins (CALCIUM-MAGNESIUM-ZINC-D3 PO), Take 1 tablet by mouth daily., Disp: , Rfl:  ?  pantoprazole (PROTONIX) 40 MG tablet, 1 po BID x 8 weeks, then 1 po daily (Patient taking differently: Take 40 mg by mouth See admin instructions. Starting 9.15.22, take 1 tablet by mouth twice daily for 8 weeks, then 1 tablet by mouth once daily.), Disp: 90 tablet, Rfl: 1 ?  rosuvastatin (CRESTOR) 10 MG tablet, TAKE 1 TABLET BY MOUTH AT  BEDTIME, Disp: 90 tablet, Rfl: 0 ?  traZODone (DESYREL) 50 MG tablet, Take 1 tablet (50 mg total) by mouth at bedtime as needed for sleep., Disp: 90 tablet, Rfl: 3 ?  vitamin E 400 UNIT capsule, Take 400 Units by mouth daily. , Disp: , Rfl:  ?Social History  ? ?Socioeconomic History  ? Marital status: Married  ?  Spouse name: Not on file  ? Number of children: 1  ? Years of education: Not on file  ? Highest education level: Not on file  ?Occupational History  ? Occupation: retired  ?Tobacco Use  ?  Smoking status: Former  ?  Types: Cigarettes  ?  Quit date: 12/23/1984  ?  Years since quitting: 36.4  ? Smokeless tobacco: Never  ?Vaping Use  ? Vaping Use: Never used  ?Substance and Sexual Activity  ? Alcohol use: Yes  ?  Comment: rare  ? Drug use: No  ? Sexual activity: Not on file  ?Other Topics Concern  ? Not on file  ?Social History Narrative  ? Patient is married and retired and has 1 grown child  ? ?Social Determinants of Health  ? ?Financial Resource Strain: Low Risk   ? Difficulty of Paying Living Expenses: Not hard at all  ?Food Insecurity: No Food Insecurity  ? Worried About Charity fundraiser in the Last Year: Never true  ? Ran Out of Food in the Last Year: Never true   ?Transportation Needs: No Transportation Needs  ? Lack of Transportation (Medical): No  ? Lack of Transportation (Non-Medical): No  ?Physical Activity: Sufficiently Active  ? Days of Exercise per Week: 1 day  ? Minutes of Exercise per Session: 150+ min  ?Stress: No Stress Concern Present  ? Feeling of Stress : Only a little  ?Social Connections: Moderately Integrated  ? Frequency of Communication with Friends and Family: More than three times a week  ? Frequency of Social Gatherings with Friends and Family: More than three times a week  ? Attends Religious Services: More than 4 times per year  ? Active Member of Clubs or Organizations: No  ? Attends Archivist Meetings: Never  ? Marital Status: Married  ?Intimate Partner Violence: Not At Risk  ? Fear of Current or Ex-Partner: No  ? Emotionally Abused: No  ? Physically Abused: No  ? Sexually Abused: No  ? ?Family History  ?Problem Relation Age of Onset  ? Colon cancer Mother 44  ?     31's  ? Breast cancer Sister   ? Asthma Brother   ? Colon polyps Neg Hx   ? Esophageal cancer Neg Hx   ? Rectal cancer Neg Hx   ? Stomach cancer Neg Hx   ? ? ?Objective: ?Office vital signs reviewed. ?BP 113/66   Pulse 69   Temp 98.3 ?F (36.8 ?C)   Resp 20   Ht 5' 5.5" (1.664 m)   Wt 204 lb (92.5 kg)   SpO2 96%   BMI 33.43 kg/m?  ? ?Physical Examination:  ?General: Awake, alert, well nourished, No acute distress ?HEENT sclera white. ?Cardio: regular rate and rhythm, S1S2 heard, murmur appreciated ?Pulm: clear to auscultation bilaterally, no wheezes, rhonchi or rales; normal work of breathing on room air ?MSK: Ambulating independently ? ?Assessment/ Plan: ?76 y.o. female  ? ?Hypertension, essential - Plan: Lipid panel, CMP14+EGFR ? ?Peripheral edema - Plan: hydrochlorothiazide (MICROZIDE) 12.5 MG capsule ? ?Mixed hyperlipidemia - Plan: Lipid panel ? ?Vitamin D deficiency - Plan: VITAMIN D 25 Hydroxy (Vit-D Deficiency, Fractures) ? ?Viral URI - Plan: benzonatate  (TESSALON PERLES) 100 MG capsule, levocetirizine (XYZAL) 5 MG tablet ? ?Blood pressure well controlled.  No changes.  HCTZ renewed.  Continue half dose of bisoprolol ? ?She will come in for fasting labs at her earliest convenience.  We will plan to check vitamin D level, lipid and CMP at that time.  Is being closely followed for CBC by her specialist.  DEXA scan to be completed today ? ?Sounds like she may be developing a viral URI, given close contact with her granddaughter.  Tessalon Perles and  Xyzal sent.  We discussed red flag signs and symptoms warranting further evaluation including signs and symptoms of bacterial infection.  She voiced good understanding. ? ?No orders of the defined types were placed in this encounter. ? ?No orders of the defined types were placed in this encounter. ? ? ? ?[name], DO ?[place] ?[phone] ? ? ? ?

## 2021-06-12 ENCOUNTER — Other Ambulatory Visit: Payer: Self-pay

## 2021-06-12 ENCOUNTER — Other Ambulatory Visit: Payer: Medicare Other

## 2021-06-12 ENCOUNTER — Inpatient Hospital Stay (HOSPITAL_BASED_OUTPATIENT_CLINIC_OR_DEPARTMENT_OTHER): Payer: Medicare Other | Admitting: Physician Assistant

## 2021-06-12 VITALS — BP 141/76 | HR 65 | Temp 98.7°F | Resp 18 | Wt 203.9 lb

## 2021-06-12 DIAGNOSIS — I10 Essential (primary) hypertension: Secondary | ICD-10-CM

## 2021-06-12 DIAGNOSIS — D500 Iron deficiency anemia secondary to blood loss (chronic): Secondary | ICD-10-CM

## 2021-06-12 DIAGNOSIS — E782 Mixed hyperlipidemia: Secondary | ICD-10-CM | POA: Diagnosis not present

## 2021-06-12 DIAGNOSIS — Z87891 Personal history of nicotine dependence: Secondary | ICD-10-CM | POA: Diagnosis not present

## 2021-06-12 DIAGNOSIS — E559 Vitamin D deficiency, unspecified: Secondary | ICD-10-CM

## 2021-06-12 DIAGNOSIS — Z803 Family history of malignant neoplasm of breast: Secondary | ICD-10-CM | POA: Diagnosis not present

## 2021-06-12 DIAGNOSIS — E538 Deficiency of other specified B group vitamins: Secondary | ICD-10-CM

## 2021-06-12 DIAGNOSIS — Z800 Family history of malignant neoplasm of digestive organs: Secondary | ICD-10-CM | POA: Diagnosis not present

## 2021-06-12 DIAGNOSIS — M8589 Other specified disorders of bone density and structure, multiple sites: Secondary | ICD-10-CM | POA: Diagnosis not present

## 2021-06-12 DIAGNOSIS — Z780 Asymptomatic menopausal state: Secondary | ICD-10-CM | POA: Diagnosis not present

## 2021-06-12 NOTE — Patient Instructions (Signed)
[place] at [facility] ?Discharge Instructions ? ?You were seen today by Tarri Abernethy PA-C for your iron deficiency anemia.  Your labs look great today!  You do not need any IV iron at this time. ? ?We will see you for labs and follow up visit in 4 months, but you can call earlier than that if you are having any issues.   ? ? ? ?Thank you for choosing [place] at [facility] to provide your oncology and hematology care.  To afford each patient quality time with our provider, please arrive at least 15 minutes before your scheduled appointment time.  ? ?If you have a lab appointment with the [facility] please come in thru the Main Entrance and check in at the main information desk. ? ?You need to re-schedule your appointment should you arrive 10 or more minutes late.  We strive to give you quality time with our providers, and arriving late affects you and other patients whose appointments are after yours.  Also, if you no show three or more times for appointments you may be dismissed from the clinic at the providers discretion.     ?Again, thank you for choosing [facility].  Our hope is that these requests will decrease the amount of time that you wait before being seen by our physicians.       ?_____________________________________________________________ ? ?Should you have questions after your visit to [facility], please contact our office at [phone] and follow the prompts.  Our office hours are 8:00 a.m. and 4:30 p.m. Monday - Friday.  Please note that voicemails left after 4:00 p.m. may not be returned until the following business day.  We are closed weekends and major holidays.  You do have access to a nurse 24-7, just call the main number to the clinic [phone] and do not press any options, hold on the line and a nurse will answer the phone.   ? ?For prescription refill requests, have your pharmacy contact our  office and allow 72 hours.   ? ?Due to Covid, you will need to wear a mask upon entering the hospital. If you do not have a mask, a mask will be given to you at the Main Entrance upon arrival. For doctor visits, patients may have 1 support person age 62 or older with them. For treatment visits, patients can not have anyone with them due to social distancing guidelines and our immunocompromised population.  ? ? ? ?

## 2021-06-13 LAB — CMP14+EGFR
ALT: 21 IU/L (ref 0–32)
AST: 26 IU/L (ref 0–40)
Albumin/Globulin Ratio: 2.2 (ref 1.2–2.2)
Albumin: 4.4 g/dL (ref 3.7–4.7)
Alkaline Phosphatase: 40 IU/L — ABNORMAL LOW (ref 44–121)
BUN/Creatinine Ratio: 10 — ABNORMAL LOW (ref 12–28)
BUN: 8 mg/dL (ref 8–27)
Bilirubin Total: 0.6 mg/dL (ref 0.0–1.2)
CO2: 27 mmol/L (ref 20–29)
Calcium: 9.5 mg/dL (ref 8.7–10.3)
Chloride: 100 mmol/L (ref 96–106)
Creatinine, Ser: 0.79 mg/dL (ref 0.57–1.00)
Globulin, Total: 2 g/dL (ref 1.5–4.5)
Glucose: 96 mg/dL (ref 70–99)
Potassium: 4.5 mmol/L (ref 3.5–5.2)
Sodium: 138 mmol/L (ref 134–144)
Total Protein: 6.4 g/dL (ref 6.0–8.5)
eGFR: 77 mL/min/{1.73_m2} (ref >59)

## 2021-06-13 LAB — LIPID PANEL
Chol/HDL Ratio: 4 ratio (ref 0.0–4.4)
Cholesterol, Total: 163 mg/dL (ref 100–199)
HDL: 41 mg/dL (ref >39)
LDL Chol Calc (NIH): 92 mg/dL (ref 0–99)
Triglycerides: 176 mg/dL — ABNORMAL HIGH (ref 0–149)
VLDL Cholesterol Cal: 30 mg/dL (ref 5–40)

## 2021-06-13 LAB — VITAMIN D 25 HYDROXY (VIT D DEFICIENCY, FRACTURES): Vit D, 25-Hydroxy: 53.7 ng/mL (ref 30.0–100.0)

## 2021-06-19 ENCOUNTER — Other Ambulatory Visit: Payer: Medicare Other

## 2021-06-19 ENCOUNTER — Other Ambulatory Visit: Payer: Self-pay

## 2021-06-19 DIAGNOSIS — D500 Iron deficiency anemia secondary to blood loss (chronic): Secondary | ICD-10-CM | POA: Diagnosis not present

## 2021-06-19 LAB — CBC WITH DIFFERENTIAL/PLATELET
Basophils Absolute: 0.1 10*3/uL (ref 0.0–0.2)
Basos: 1 %
EOS (ABSOLUTE): 0.2 10*3/uL (ref 0.0–0.4)
Eos: 4 %
Hematocrit: 39 % (ref 34.0–46.6)
Hemoglobin: 13.4 g/dL (ref 11.1–15.9)
Immature Grans (Abs): 0 10*3/uL (ref 0.0–0.1)
Immature Granulocytes: 0 %
Lymphocytes Absolute: 1.8 10*3/uL (ref 0.7–3.1)
Lymphs: 30 %
MCH: 31.2 pg (ref 26.6–33.0)
MCHC: 34.4 g/dL (ref 31.5–35.7)
MCV: 91 fL (ref 79–97)
Monocytes Absolute: 0.6 10*3/uL (ref 0.1–0.9)
Monocytes: 9 %
Neutrophils Absolute: 3.4 10*3/uL (ref 1.4–7.0)
Neutrophils: 56 %
Platelets: 222 10*3/uL (ref 150–450)
RBC: 4.29 x10E6/uL (ref 3.77–5.28)
RDW: 12.5 % (ref 11.7–15.4)
WBC: 6.1 10*3/uL (ref 3.4–10.8)

## 2021-06-20 LAB — IRON AND TIBC
Iron Saturation: 30 % (ref 15–55)
Iron: 91 ug/dL (ref 27–139)
Total Iron Binding Capacity: 301 ug/dL (ref 250–450)
UIBC: 210 ug/dL (ref 118–369)

## 2021-06-20 LAB — VITAMIN B12: Vitamin B-12: 451 pg/mL (ref 232–1245)

## 2021-06-20 LAB — FERRITIN: Ferritin: 114 ng/mL (ref 15–150)

## 2021-06-21 LAB — METHYLMALONIC ACID, SERUM: Methylmalonic Acid: 145 nmol/L (ref 0–378)

## 2021-07-13 ENCOUNTER — Other Ambulatory Visit: Payer: Self-pay | Admitting: Family Medicine

## 2021-09-02 ENCOUNTER — Other Ambulatory Visit: Payer: Self-pay | Admitting: Family Medicine

## 2021-09-02 DIAGNOSIS — J069 Acute upper respiratory infection, unspecified: Secondary | ICD-10-CM

## 2021-09-07 ENCOUNTER — Other Ambulatory Visit: Payer: Self-pay | Admitting: Adult Health

## 2021-09-13 ENCOUNTER — Encounter: Payer: Self-pay | Admitting: Family Medicine

## 2021-09-13 ENCOUNTER — Ambulatory Visit (INDEPENDENT_AMBULATORY_CARE_PROVIDER_SITE_OTHER): Payer: Medicare Other | Admitting: Family Medicine

## 2021-09-13 ENCOUNTER — Other Ambulatory Visit: Payer: Medicare Other

## 2021-09-13 VITALS — BP 107/69 | HR 61 | Temp 97.9°F | Resp 20 | Ht 65.0 in | Wt 206.0 lb

## 2021-09-13 DIAGNOSIS — R195 Other fecal abnormalities: Secondary | ICD-10-CM

## 2021-09-13 LAB — HEMOGLOBIN, FINGERSTICK: Hemoglobin: 11.1 g/dL (ref 11.1–15.9)

## 2021-09-16 ENCOUNTER — Inpatient Hospital Stay (HOSPITAL_COMMUNITY)
Admission: EM | Admit: 2021-09-16 | Discharge: 2021-09-19 | DRG: 378 | Disposition: A | Discharge status: Home / Self Care | Payer: Medicare Other | Attending: Internal Medicine | Admitting: Internal Medicine

## 2021-09-16 ENCOUNTER — Encounter (HOSPITAL_COMMUNITY): Payer: Self-pay | Admitting: Emergency Medicine

## 2021-09-16 ENCOUNTER — Other Ambulatory Visit: Payer: Self-pay

## 2021-09-16 ENCOUNTER — Emergency Department (HOSPITAL_COMMUNITY): Payer: Medicare Other

## 2021-09-16 DIAGNOSIS — Z882 Allergy status to sulfonamides status: Secondary | ICD-10-CM | POA: Diagnosis not present

## 2021-09-16 DIAGNOSIS — I10 Essential (primary) hypertension: Secondary | ICD-10-CM | POA: Diagnosis not present

## 2021-09-16 DIAGNOSIS — K922 Gastrointestinal hemorrhage, unspecified: Secondary | ICD-10-CM | POA: Diagnosis not present

## 2021-09-16 DIAGNOSIS — Z803 Family history of malignant neoplasm of breast: Secondary | ICD-10-CM | POA: Diagnosis not present

## 2021-09-16 DIAGNOSIS — K264 Chronic or unspecified duodenal ulcer with hemorrhage: Secondary | ICD-10-CM | POA: Diagnosis not present

## 2021-09-16 DIAGNOSIS — E876 Hypokalemia: Secondary | ICD-10-CM | POA: Diagnosis present

## 2021-09-16 DIAGNOSIS — Z825 Family history of asthma and other chronic lower respiratory diseases: Secondary | ICD-10-CM

## 2021-09-16 DIAGNOSIS — K250 Acute gastric ulcer with hemorrhage: Secondary | ICD-10-CM | POA: Diagnosis not present

## 2021-09-16 DIAGNOSIS — E782 Mixed hyperlipidemia: Secondary | ICD-10-CM | POA: Diagnosis present

## 2021-09-16 DIAGNOSIS — R0602 Shortness of breath: Secondary | ICD-10-CM | POA: Diagnosis not present

## 2021-09-16 DIAGNOSIS — K921 Melena: Secondary | ICD-10-CM | POA: Diagnosis not present

## 2021-09-16 DIAGNOSIS — Z800 Family history of malignant neoplasm of digestive organs: Secondary | ICD-10-CM | POA: Diagnosis not present

## 2021-09-16 DIAGNOSIS — D649 Anemia, unspecified: Secondary | ICD-10-CM | POA: Diagnosis not present

## 2021-09-16 DIAGNOSIS — Z79899 Other long term (current) drug therapy: Secondary | ICD-10-CM | POA: Diagnosis not present

## 2021-09-16 DIAGNOSIS — E785 Hyperlipidemia, unspecified: Secondary | ICD-10-CM | POA: Diagnosis present

## 2021-09-16 DIAGNOSIS — I959 Hypotension, unspecified: Secondary | ICD-10-CM | POA: Diagnosis present

## 2021-09-16 DIAGNOSIS — E7800 Pure hypercholesterolemia, unspecified: Secondary | ICD-10-CM | POA: Diagnosis not present

## 2021-09-16 DIAGNOSIS — D62 Acute posthemorrhagic anemia: Secondary | ICD-10-CM | POA: Diagnosis present

## 2021-09-16 DIAGNOSIS — K449 Diaphragmatic hernia without obstruction or gangrene: Secondary | ICD-10-CM | POA: Diagnosis present

## 2021-09-16 DIAGNOSIS — Z87891 Personal history of nicotine dependence: Secondary | ICD-10-CM | POA: Diagnosis not present

## 2021-09-16 DIAGNOSIS — I422 Other hypertrophic cardiomyopathy: Secondary | ICD-10-CM | POA: Diagnosis not present

## 2021-09-16 LAB — COMPREHENSIVE METABOLIC PANEL
ALT: 17 U/L (ref 0–44)
AST: 27 U/L (ref 15–41)
Albumin: 4.1 g/dL (ref 3.5–5.0)
Alkaline Phosphatase: 35 U/L — ABNORMAL LOW (ref 38–126)
Anion gap: 9 (ref 5–15)
BUN: 22 mg/dL (ref 8–23)
CO2: 27 mmol/L (ref 22–32)
Calcium: 8.7 mg/dL — ABNORMAL LOW (ref 8.9–10.3)
Chloride: 100 mmol/L (ref 98–111)
Creatinine, Ser: 0.84 mg/dL (ref 0.44–1.00)
GFR, Estimated: >60 mL/min (ref >60)
Glucose, Bld: 105 mg/dL — ABNORMAL HIGH (ref 70–99)
Potassium: 3.2 mmol/L — ABNORMAL LOW (ref 3.5–5.1)
Sodium: 136 mmol/L (ref 135–145)
Total Bilirubin: 0.9 mg/dL (ref 0.3–1.2)
Total Protein: 6.7 g/dL (ref 6.5–8.1)

## 2021-09-16 LAB — RETICULOCYTES
Immature Retic Fract: 37.4 % — ABNORMAL HIGH (ref 2.3–15.9)
RBC.: 3.3 MIL/uL — ABNORMAL LOW (ref 3.87–5.11)
Retic Count, Absolute: 152.5 10*3/uL (ref 19.0–186.0)
Retic Ct Pct: 4.6 % — ABNORMAL HIGH (ref 0.4–3.1)

## 2021-09-16 LAB — CBC WITH DIFFERENTIAL/PLATELET
Abs Immature Granulocytes: 0.02 10*3/uL (ref 0.00–0.07)
Basophils Absolute: 0.1 10*3/uL (ref 0.0–0.1)
Basophils Relative: 1 %
Eosinophils Absolute: 0.3 10*3/uL (ref 0.0–0.5)
Eosinophils Relative: 3 %
HCT: 30 % — ABNORMAL LOW (ref 36.0–46.0)
Hemoglobin: 9.9 g/dL — ABNORMAL LOW (ref 12.0–15.0)
Immature Granulocytes: 0 %
Lymphocytes Relative: 35 %
Lymphs Abs: 3 10*3/uL (ref 0.7–4.0)
MCH: 30 pg (ref 26.0–34.0)
MCHC: 33 g/dL (ref 30.0–36.0)
MCV: 90.9 fL (ref 80.0–100.0)
Monocytes Absolute: 0.9 10*3/uL (ref 0.1–1.0)
Monocytes Relative: 11 %
Neutro Abs: 4.3 10*3/uL (ref 1.7–7.7)
Neutrophils Relative %: 50 %
Platelets: 209 10*3/uL (ref 150–400)
RBC: 3.3 MIL/uL — ABNORMAL LOW (ref 3.87–5.11)
RDW: 14.3 % (ref 11.5–15.5)
WBC: 8.6 10*3/uL (ref 4.0–10.5)
nRBC: 0 % (ref 0.0–0.2)

## 2021-09-16 LAB — TYPE AND SCREEN
ABO/RH(D): O POS
Antibody Screen: NEGATIVE

## 2021-09-16 LAB — IRON AND TIBC
Iron: 62 ug/dL (ref 28–170)
Saturation Ratios: 15 % (ref 10.4–31.8)
TIBC: 414 ug/dL (ref 250–450)
UIBC: 352 ug/dL

## 2021-09-16 LAB — BRAIN NATRIURETIC PEPTIDE: B Natriuretic Peptide: 449 pg/mL — ABNORMAL HIGH (ref 0.0–100.0)

## 2021-09-16 LAB — FERRITIN: Ferritin: 19 ng/mL (ref 11–307)

## 2021-09-16 LAB — FOLATE: Folate: 11.1 ng/mL (ref >5.9)

## 2021-09-16 LAB — VITAMIN B12: Vitamin B-12: 194 pg/mL (ref 180–914)

## 2021-09-16 MED ORDER — PANTOPRAZOLE SODIUM 40 MG IV SOLR
40.0000 mg | INTRAVENOUS | Status: AC
  Administered 2021-09-16: 40 mg via INTRAVENOUS
  Filled 2021-09-16: qty 10

## 2021-09-16 MED ORDER — SODIUM CHLORIDE 0.9 % IV SOLN: INTRAVENOUS | Status: DC

## 2021-09-16 MED ORDER — POTASSIUM CHLORIDE 10 MEQ/100ML IV SOLN
10.0000 meq | INTRAVENOUS | Status: AC
  Administered 2021-09-16 – 2021-09-17 (×4): 10 meq via INTRAVENOUS
  Filled 2021-09-16: qty 100

## 2021-09-16 MED ORDER — METOCLOPRAMIDE HCL 5 MG/ML IJ SOLN
10.0000 mg | Freq: Once | INTRAMUSCULAR | Status: AC
  Administered 2021-09-17: 10 mg via INTRAVENOUS
  Filled 2021-09-16: qty 2

## 2021-09-16 NOTE — Assessment & Plan Note (Addendum)
-  K+ 3.2 -Replaced

## 2021-09-16 NOTE — Assessment & Plan Note (Signed)
Continue crestor 

## 2021-09-16 NOTE — Assessment & Plan Note (Signed)
-  melena for several days -History of GI bleed -GI consulted from ED and plans to scope in the AM -Reglan ordered for AM as per GI request -Protonix BID -NPO -trend HH -Continue to monitor

## 2021-09-16 NOTE — ED Provider Notes (Signed)
[facility] EMERGENCY DEPARTMENT Provider Note   CSN: [id number] Arrival date & time: 09/16/21  1742     History  Chief Complaint  Patient presents with   Hypotension    Stacey Reyes is a 76 y.o. female.  HPI  Patient is a 76 year old female, she has multiple medical problems including hypertension on bisoprolol, hypercholesterolemia on Crestor and Protonix.  She presents with a complaint of feeling lightheaded, having black stools, feeling short of breath and dizzy.  Was seen at PCP this week - had 2 g drop in Hgb - still having black stools - not on thinners.    No coughing -no fevers, no chills, no swelling of the legs.  Home Medications Prior to Admission medications   Medication Sig Start Date End Date Taking? Authorizing Provider  Ascorbic Acid (VITAMIN C) 1000 MG tablet Take 1,000 mg by mouth daily.   Yes [provider]  bisoprolol (ZEBETA) 5 MG tablet TAKE ONE-HALF TABLET BY  MOUTH DAILY Patient taking differently: Take 2.5 mg by mouth daily. 09/07/21  Yes Rollene Rotunda, MD  Copper Gluconate (COPPER CAPS PO) Take 1 capsule by mouth daily.    Yes [provider]  ergocalciferol (VITAMIN D2) 1.25 MG (50000 UT) capsule Take 50,000 Units by mouth once a week.   Yes [provider]  hydrochlorothiazide (MICROZIDE) 12.5 MG capsule Take 1 capsule (12.5 mg total) by mouth daily. Patient taking differently: Take 12.5 mg by mouth at bedtime. 06/11/21  Yes Gottschalk, Ashly M, DO  levocetirizine (XYZAL) 5 MG tablet TAKE 1 TABLET (5 MG TOTAL) BY MOUTH AT BEDTIME AS NEEDED FOR ALLERGIES (DRAINAGE). 09/03/21  Yes Delynn Flavin M, DO  Magnesium 200 MG TABS Take 1 tablet (200 mg total) by mouth daily. 03/23/19  Yes Jodelle Gross, NP  Multiple Minerals-Vitamins (CALCIUM-MAGNESIUM-ZINC-D3 PO) Take 1 tablet by mouth daily.   Yes [provider]  pantoprazole (PROTONIX) 40 MG tablet TAKE 1 TABLET BY MOUTH  DAILY Patient taking differently:  Take 40 mg by mouth daily. 07/16/21  Yes Gottschalk, Kathie Rhodes M, DO  rosuvastatin (CRESTOR) 10 MG tablet Take 1 tablet (10 mg total) by mouth at bedtime. 06/11/21  Yes Gottschalk, Kathie Rhodes M, DO  traZODone (DESYREL) 50 MG tablet Take 1 tablet (50 mg total) by mouth at bedtime as needed for sleep. 04/02/21  Yes Delynn Flavin M, DO  vitamin E 400 UNIT capsule Take 400 Units by mouth daily.    Yes [provider]      Allergies    Sulfa antibiotics    Review of Systems   Review of Systems  All other systems reviewed and are negative.   Physical Exam Updated Vital Signs BP 114/61   Pulse 61   Temp 97.9 F (36.6 C) (Oral)   Resp 15   Ht 1.676 m (5\' 6" )   Wt 93.4 kg   SpO2 96%   BMI 33.25 kg/m  Physical Exam Vitals and nursing note reviewed.  Constitutional:      General: She is not in acute distress.    Appearance: She is well-developed.  HENT:     Head: Normocephalic and atraumatic.     Nose: Nose normal.     Mouth/Throat:     Mouth: Mucous membranes are moist.     Pharynx: No oropharyngeal exudate.  Eyes:     General: No scleral icterus.       Right eye: No discharge.        Left eye: No discharge.  Conjunctiva/sclera: Conjunctivae normal.     Pupils: Pupils are equal, round, and reactive to light.  Neck:     Thyroid: No thyromegaly.     Vascular: No JVD.  Cardiovascular:     Rate and Rhythm: Normal rate and regular rhythm.     Heart sounds: Normal heart sounds. No murmur heard.    No friction rub. No gallop.  Pulmonary:     Effort: Pulmonary effort is normal. No respiratory distress.     Breath sounds: Normal breath sounds. No wheezing or rales.  Abdominal:     General: Bowel sounds are normal. There is no distension.     Palpations: Abdomen is soft. There is no mass.     Tenderness: There is no abdominal tenderness.  Musculoskeletal:        General: No tenderness. Normal range of motion.     Cervical back: Normal range of motion and neck supple.      Right lower leg: No edema.     Left lower leg: No edema.  Lymphadenopathy:     Cervical: No cervical adenopathy.  Skin:    General: Skin is warm and dry.     Findings: No erythema or rash.  Neurological:     Mental Status: She is alert.     Coordination: Coordination normal.  Psychiatric:        Behavior: Behavior normal.     ED Results / Procedures / Treatments   Labs (all labs ordered are listed, but only abnormal results are displayed) Labs Reviewed  CBC WITH DIFFERENTIAL/PLATELET - Abnormal; Notable for the following components:      Result Value   RBC 3.30 (*)    Hemoglobin 9.9 (*)    HCT 30.0 (*)    All other components within normal limits  BRAIN NATRIURETIC PEPTIDE - Abnormal; Notable for the following components:   B Natriuretic Peptide 449.0 (*)    All other components within normal limits  COMPREHENSIVE METABOLIC PANEL - Abnormal; Notable for the following components:   Potassium 3.2 (*)    Glucose, Bld 105 (*)    Calcium 8.7 (*)    Alkaline Phosphatase 35 (*)    All other components within normal limits  RETICULOCYTES - Abnormal; Notable for the following components:   Retic Ct Pct 4.6 (*)    RBC. 3.30 (*)    Immature Retic Fract 37.4 (*)    All other components within normal limits  VITAMIN B12  FOLATE  IRON AND TIBC  FERRITIN  POC OCCULT BLOOD, ED  TYPE AND SCREEN    EKG EKG Interpretation  Date/Time:  Sunday September 16 2021 19:08:12 EDT Ventricular Rate:  62 PR Interval:  225 QRS Duration: 108 QT Interval:  465 QTC Calculation: 473 R Axis:   7 Text Interpretation: Sinus rhythm Prolonged PR interval Probable left atrial enlargement LVH with secondary repolarization abnormality Anterior Q waves, possibly due to LVH multiple EKG's int he past with similar morphology Confirmed by Eber Hong ([id number]) on 09/16/2021 7:20:35 PM  Radiology DG Chest Port 1 View  Result Date: 09/16/2021 CLINICAL DATA:  Shortness of breath for 2 days EXAM: PORTABLE CHEST  1 VIEW COMPARISON:  12/11/2026 FINDINGS: Cardiac shadow is within normal limits. Aortic calcifications are seen. Lungs are clear bilaterally. Mild chronic interstitial changes are noted. No acute bony abnormality is seen. IMPRESSION: No active disease. Electronically Signed   By: Alcide Clever M.D.   On: 09/16/2021 19:05    Procedures Procedures    Medications  Ordered in ED Medications  0.9 %  sodium chloride infusion ( Intravenous New Bag/Given 09/16/21 1908)    ED Course/ Medical Decision Making/ A&P                           Medical Decision Making Amount and/or Complexity of Data Reviewed Labs: ordered. Radiology: ordered. ECG/medicine tests: ordered.  Risk Prescription drug management. Decision regarding hospitalization.   The differential diagnosis includes : The patient has minimal appearing paleness to her mucous membranes, vital signs reflect a blood pressure of 88/62 and she is telling me that she has had blood pressure as low as 70/40 earlier today.  She will be given some fluids, will check for signs of infection but most importantly will make sure that she is not having a GI bleed that is active and causing her to be anemic.  Patient is well-appearing, there is no abnormal lung sounds, her oxygen is 96% and she has normal full sentences when she speaks.  EKG will be obtained to make sure there is no other cause of dizziness.   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  The pertinent results include: Recent  labs checked, hemoglobin was just over 11, today it is progressive anemia, now it is below 10   Imaging Studies ordered:  I ordered imaging studies including chest x-ray I independently visualized and interpreted imaging which showed no acute disease I agree with the radiologist interpretation   Cardiac Monitoring: / EKG:  The patient was maintained on a cardiac monitor.  I personally viewed and interpreted the cardiac monitored which showed an underlying rhythm of: Normal sinus rhythm, some hypotension present   Consultations Obtained:  I requested consultation with the gastroenterologist Dr. Levon Hedger and the hospitalist,  and discussed lab and imaging findings as well as pertinent plan - they recommend: Admission to the hospital, likely needs upper endoscopy   Problem List / ED Course / Critical interventions / Medication management  Patient will be given Protonix, IV fluids and admitted to the hospital I ordered medication including Protonix IV for upper GI bleed Reevaluation of the patient after these medicines showed that the patient improved I have reviewed the patients home medicines and have made adjustments as needed   Social Determinants of Health:  None   Test / Admission - Considered:  We will need to be admitted to the hospital         Final Clinical Impression(s) / ED Diagnoses Final diagnoses:  Upper GI bleed  Acute blood loss anemia     Eber Hong, MD 09/16/21 2222

## 2021-09-16 NOTE — Assessment & Plan Note (Addendum)
-  Hgb down to 9.9 from 13s baseline -2/2 acute blood loss; Melena for several days  -Typed and screened -[facility] at midnight and with morning labs -transfuse < 8.0 -nadir Hgb 7.5 -Hgb 8.5 on day of d/c

## 2021-09-17 ENCOUNTER — Encounter (HOSPITAL_COMMUNITY): Payer: Self-pay | Admitting: Family Medicine

## 2021-09-17 ENCOUNTER — Inpatient Hospital Stay (HOSPITAL_COMMUNITY): Payer: Medicare Other | Admitting: Anesthesiology

## 2021-09-17 ENCOUNTER — Encounter (HOSPITAL_COMMUNITY)
Admission: EM | Disposition: A | Discharge status: Home / Self Care | Payer: Self-pay | Source: Home / Self Care | Attending: Family Medicine

## 2021-09-17 DIAGNOSIS — I10 Essential (primary) hypertension: Secondary | ICD-10-CM | POA: Diagnosis not present

## 2021-09-17 DIAGNOSIS — K449 Diaphragmatic hernia without obstruction or gangrene: Secondary | ICD-10-CM | POA: Diagnosis not present

## 2021-09-17 DIAGNOSIS — K922 Gastrointestinal hemorrhage, unspecified: Secondary | ICD-10-CM | POA: Diagnosis not present

## 2021-09-17 DIAGNOSIS — Z87891 Personal history of nicotine dependence: Secondary | ICD-10-CM | POA: Diagnosis not present

## 2021-09-17 DIAGNOSIS — D649 Anemia, unspecified: Secondary | ICD-10-CM

## 2021-09-17 DIAGNOSIS — D62 Acute posthemorrhagic anemia: Secondary | ICD-10-CM

## 2021-09-17 DIAGNOSIS — K921 Melena: Secondary | ICD-10-CM | POA: Diagnosis not present

## 2021-09-17 HISTORY — PX: ESOPHAGOGASTRODUODENOSCOPY (EGD) WITH PROPOFOL: SHX5813

## 2021-09-17 LAB — COMPREHENSIVE METABOLIC PANEL
ALT: 14 U/L (ref 0–44)
AST: 22 U/L (ref 15–41)
Albumin: 3.3 g/dL — ABNORMAL LOW (ref 3.5–5.0)
Alkaline Phosphatase: 23 U/L — ABNORMAL LOW (ref 38–126)
Anion gap: 6 (ref 5–15)
BUN: 17 mg/dL (ref 8–23)
CO2: 25 mmol/L (ref 22–32)
Calcium: 8 mg/dL — ABNORMAL LOW (ref 8.9–10.3)
Chloride: 107 mmol/L (ref 98–111)
Creatinine, Ser: 0.75 mg/dL (ref 0.44–1.00)
GFR, Estimated: >60 mL/min (ref >60)
Glucose, Bld: 106 mg/dL — ABNORMAL HIGH (ref 70–99)
Potassium: 3.6 mmol/L (ref 3.5–5.1)
Sodium: 138 mmol/L (ref 135–145)
Total Bilirubin: 0.9 mg/dL (ref 0.3–1.2)
Total Protein: 5.5 g/dL — ABNORMAL LOW (ref 6.5–8.1)

## 2021-09-17 LAB — CBC WITH DIFFERENTIAL/PLATELET
Abs Immature Granulocytes: 0.03 10*3/uL (ref 0.00–0.07)
Basophils Absolute: 0 10*3/uL (ref 0.0–0.1)
Basophils Relative: 1 %
Eosinophils Absolute: 0.2 10*3/uL (ref 0.0–0.5)
Eosinophils Relative: 4 %
HCT: 24.4 % — ABNORMAL LOW (ref 36.0–46.0)
Hemoglobin: 7.8 g/dL — ABNORMAL LOW (ref 12.0–15.0)
Immature Granulocytes: 1 %
Lymphocytes Relative: 35 %
Lymphs Abs: 1.9 10*3/uL (ref 0.7–4.0)
MCH: 29.8 pg (ref 26.0–34.0)
MCHC: 32 g/dL (ref 30.0–36.0)
MCV: 93.1 fL (ref 80.0–100.0)
Monocytes Absolute: 0.6 10*3/uL (ref 0.1–1.0)
Monocytes Relative: 10 %
Neutro Abs: 2.8 10*3/uL (ref 1.7–7.7)
Neutrophils Relative %: 49 %
Platelets: 155 10*3/uL (ref 150–400)
RBC: 2.62 MIL/uL — ABNORMAL LOW (ref 3.87–5.11)
RDW: 14.6 % (ref 11.5–15.5)
WBC: 5.6 10*3/uL (ref 4.0–10.5)
nRBC: 0 % (ref 0.0–0.2)

## 2021-09-17 LAB — MAGNESIUM: Magnesium: 1.9 mg/dL (ref 1.7–2.4)

## 2021-09-17 LAB — HEMOGLOBIN AND HEMATOCRIT, BLOOD
HCT: 24.3 % — ABNORMAL LOW (ref 36.0–46.0)
Hemoglobin: 7.9 g/dL — ABNORMAL LOW (ref 12.0–15.0)

## 2021-09-17 SURGERY — ESOPHAGOGASTRODUODENOSCOPY (EGD) WITH PROPOFOL
Anesthesia: General

## 2021-09-17 MED ORDER — ACETAMINOPHEN 325 MG PO TABS: 650.0000 mg | ORAL_TABLET | Freq: Four times a day (QID) | ORAL | Status: DC | PRN

## 2021-09-17 MED ORDER — PROPOFOL 10 MG/ML IV BOLUS
INTRAVENOUS | Status: DC | PRN
  Administered 2021-09-17: 100 mg via INTRAVENOUS
  Administered 2021-09-17 (×4): 20 mg via INTRAVENOUS

## 2021-09-17 MED ORDER — LACTATED RINGERS IV SOLN: INTRAVENOUS | Status: DC

## 2021-09-17 MED ORDER — ROSUVASTATIN CALCIUM 10 MG PO TABS
10.0000 mg | ORAL_TABLET | Freq: Every day | ORAL | Status: DC
  Administered 2021-09-17 – 2021-09-18 (×2): 10 mg via ORAL
  Filled 2021-09-17 (×2): qty 1

## 2021-09-17 MED ORDER — PANTOPRAZOLE SODIUM 40 MG IV SOLR
40.0000 mg | Freq: Two times a day (BID) | INTRAVENOUS | Status: DC
  Administered 2021-09-17 – 2021-09-18 (×3): 40 mg via INTRAVENOUS
  Filled 2021-09-17 (×3): qty 10

## 2021-09-17 MED ORDER — BISOPROLOL FUMARATE 5 MG PO TABS
2.5000 mg | ORAL_TABLET | Freq: Every day | ORAL | Status: DC
  Administered 2021-09-17 – 2021-09-19 (×3): 2.5 mg via ORAL
  Filled 2021-09-17 (×3): qty 1

## 2021-09-17 MED ORDER — TRAZODONE HCL 50 MG PO TABS: 50.0000 mg | ORAL_TABLET | Freq: Every evening | ORAL | Status: DC | PRN

## 2021-09-17 MED ORDER — LIDOCAINE 2% (20 MG/ML) 5 ML SYRINGE
INTRAMUSCULAR | Status: DC | PRN
  Administered 2021-09-17: 50 mg via INTRAVENOUS

## 2021-09-17 MED ORDER — SODIUM CHLORIDE 0.9 % IV SOLN
INTRAVENOUS | Status: DC
  Administered 2021-09-17: 700 mL via INTRAVENOUS

## 2021-09-17 MED ORDER — HYDROCHLOROTHIAZIDE 12.5 MG PO TABS
12.5000 mg | ORAL_TABLET | Freq: Every day | ORAL | Status: DC
  Administered 2021-09-17 – 2021-09-18 (×2): 12.5 mg via ORAL
  Filled 2021-09-17 (×2): qty 1

## 2021-09-17 MED ORDER — MORPHINE SULFATE (PF) 2 MG/ML IV SOLN: 2.0000 mg | INTRAVENOUS | Status: DC | PRN

## 2021-09-17 MED ORDER — HYDROCHLOROTHIAZIDE 12.5 MG PO CAPS: 12.5000 mg | ORAL_CAPSULE | Freq: Every day | ORAL | Status: DC

## 2021-09-17 MED ORDER — ONDANSETRON HCL 4 MG/2ML IJ SOLN: 4.0000 mg | Freq: Four times a day (QID) | INTRAMUSCULAR | Status: DC | PRN

## 2021-09-17 MED ORDER — ONDANSETRON HCL 4 MG PO TABS: 4.0000 mg | ORAL_TABLET | Freq: Four times a day (QID) | ORAL | Status: DC | PRN

## 2021-09-17 MED ORDER — OXYCODONE HCL 5 MG PO TABS: 5.0000 mg | ORAL_TABLET | ORAL | Status: DC | PRN

## 2021-09-17 MED ORDER — ACETAMINOPHEN 650 MG RE SUPP: 650.0000 mg | Freq: Four times a day (QID) | RECTAL | Status: DC | PRN

## 2021-09-17 NOTE — Interval H&P Note (Signed)
History and Physical Interval Note:  09/17/2021 1:16 PM  Stacey Reyes  has presented today for surgery, with the diagnosis of acute blood loss anemia, melena.  The various methods of treatment have been discussed with the patient and family. After consideration of risks, benefits and other options for treatment, the patient has consented to  Procedure(s): ESOPHAGOGASTRODUODENOSCOPY (EGD) WITH PROPOFOL (N/A) as a surgical intervention.  The patient's history has been reviewed, patient examined, no change in status, stable for surgery.  I have reviewed the patient's chart and labs.  Questions were answered to the patient's satisfaction.     Lanelle Bal

## 2021-09-17 NOTE — Consult Note (Signed)
Gastroenterology Consult   Referring Provider: Dr. Eber Hong  Primary Care Physician:  Raliegh Ip, DO Primary Gastroenterologist: [facility]  Patient ID: Stacey Reyes; [id number]; Apr 20, 1945   Admit date: 09/16/2021  LOS: 1 day   Date of Consultation: 09/17/2021  Reason for Consultation:  GI bleeding  History of Present Illness   Stacey Reyes is a 76 y.o. year old female with a history of IDA, known AVMs, extensive GI evaluation dating back to August 202 due to GI bleeding and requiring blood transfusions, prior need for IR in Sept 2022 on 2 occasions with last IR intervention Sept 21, 2022 s/p coil embolization of inferior pancreaticoduodenal artery from SMA and exoseal for hemostasis. She has had multiple EGDs due to upper GI bleeding.   Noted onset of black stool about 3 weeks ago, worsening fatigue, and she saw her PCP as outpatient on 6/22 due to concerns for melena. Hgb at that time was 11.1, down from 13 range in March 2023. Presented to the ED yesterday with worsening fatigue and persistent black stools. Admitting Hgb 9.9 yesterday. This morning, Hgb 7.8.   She notes associated fatigue, weakness. Last episode of black stool around 0600. Denies abdominal pain, no N/V. No NSAIDs. Only takes Tylenol. She states she had been doing well after the last IR intervention, without any evidence of GI bleeding. Last iron infusion in Dec 2022. She has not required iron since that time. Followed by Hematology.  She does note lower back pain that relieves with sitting. She states lower leg numbness at times with standing. PCP is aware.      Prior GI evaluation: EGD August 2020: Duodenal erosions biopsied (benign), otherwise normal exam.  Colonoscopy August 2020: 2 polyps removed (tubular adenomas), diverticulosis in the sigmoid, descending, transverse colon, otherwise normal exam. EGD August 2021: Normal esophagus, small hiatal hernia, innocent appearing duodenal  erosions. Small bowel capsule August 2021: Benign duodenal erosions without active bleeding, large amounts of old blood in cecum with no obvious source. Colonoscopy November 2021: 7 polyps removed (tubular adenomas), diverticulosis in sigmoid, descending, transverse, and descending colon, erythema and petechia in association with diverticulosis in the left colon biopsies (benign), external and internal hemorrhoids. Givens capsule November 2021: Duodenitis, 1 small AVM in proximal small bowel about 8 minutes from first duodenal image, 1 tiny AVM at 1 hour 19 minutes, numerous tiny lesions versus luminal contents of unclear significance and distal small bowel. EGD July 2022: normal duodenal bulb, first portion of duodenum and second portion of duodenum.  Capsule study July 2022: duodenitis. No obvious AVMs appreciated. Complete to the cecum. Possible old blood in colon but difficult to visualize due to debris.  EGD Sept 13, 2022: normal GE junction, multiple gastric polyps, blood in second portion of duodenum suspected secondary to Dieulafoy lesion.  IR 9/13: empiric percutaneous coil embolization of the GDA and a hypertrophied pancreaticoduodenal artery EGD 12/11/20: blood in duodenal bulb and second portion, one bleeding AVM s/p epi but unsuccessful, s/p clips X 3 and hemospray.  IR 12/13/20: coil embolization of inferior pancreaticoduodenal artery from SMA and exoseal for hemostasis.   Past Medical History:  Diagnosis Date   Anemia    Blood transfusion without reported diagnosis    Cataract    removed bilateral   Chronic cystitis    Diverticulitis    Heart murmur    Hyperlipidemia    Hypertension    Personal history of colonic polyps-adenomas 01/07/2012   2009 - 2 diminutive adenomas (  prior polyps also) 01/07/2012 - 2 diminutive adenomas      Past Surgical History:  Procedure Laterality Date   BREAST BIOPSY Right    No Scar seen    COLONOSCOPY  multiple   ESOPHAGOGASTRODUODENOSCOPY  (EGD) WITH PROPOFOL N/A 11/23/2019   Procedure: ESOPHAGOGASTRODUODENOSCOPY (EGD) WITH PROPOFOL;  Surgeon: Corbin Ade, MD;  [facility];  Service: Endoscopy;  Laterality: N/A;   ESOPHAGOGASTRODUODENOSCOPY (EGD) WITH PROPOFOL N/A 10/20/2020   Procedure: ESOPHAGOGASTRODUODENOSCOPY (EGD) WITH PROPOFOL;  Surgeon: Lanelle Bal, DO;  [facility];  Service: Endoscopy;  Laterality: N/A;   ESOPHAGOGASTRODUODENOSCOPY (EGD) WITH PROPOFOL N/A 12/05/2020   Procedure: ESOPHAGOGASTRODUODENOSCOPY (EGD) WITH PROPOFOL;  Surgeon: Malissa Hippo, MD;  [facility];  Service: Endoscopy;  Laterality: N/A;  with enteroscopy   ESOPHAGOGASTRODUODENOSCOPY (EGD) WITH PROPOFOL N/A 12/11/2020   Procedure: ESOPHAGOGASTRODUODENOSCOPY (EGD) WITH PROPOFOL;  Surgeon: Lanelle Bal, DO;  [facility];  Service: Endoscopy;  Laterality: N/A;   GIVENS CAPSULE STUDY N/A 11/23/2019   Procedure: GIVENS CAPSULE STUDY;  Surgeon: Corbin Ade, MD;  [facility];  Service: Endoscopy;  Laterality: N/A;   GIVENS CAPSULE STUDY N/A 10/22/2020   Procedure: GIVENS CAPSULE STUDY;  Surgeon: Lanelle Bal, DO;  [facility];  Service: Endoscopy;  Laterality: N/A;   HEMOSTASIS CLIP PLACEMENT  12/11/2020   Procedure: HEMOSTASIS CLIP PLACEMENT;  Surgeon: Lanelle Bal, DO;  [facility];  Service: Endoscopy;;   IR ANGIOGRAM SELECTIVE EACH ADDITIONAL VESSEL  12/05/2020   IR ANGIOGRAM SELECTIVE EACH ADDITIONAL VESSEL  12/05/2020   IR ANGIOGRAM SELECTIVE EACH ADDITIONAL VESSEL  12/05/2020   IR ANGIOGRAM SELECTIVE EACH ADDITIONAL VESSEL  12/13/2020   IR ANGIOGRAM SELECTIVE EACH ADDITIONAL VESSEL  12/13/2020   IR ANGIOGRAM VISCERAL SELECTIVE  12/05/2020   IR ANGIOGRAM VISCERAL SELECTIVE  12/13/2020   IR EMBO ART  VEN HEMORR LYMPH EXTRAV  INC GUIDE ROADMAPPING  12/05/2020   IR EMBO ART  VEN HEMORR LYMPH EXTRAV  INC GUIDE ROADMAPPING  12/13/2020   IR RADIOLOGIST EVAL &  MGMT  01/11/2021   IR US GUIDE VASC ACCESS LEFT  12/13/2020   IR US GUIDE VASC ACCESS RIGHT  12/05/2020    Prior to Admission medications   Medication Sig Start Date End Date Taking? Authorizing Provider  Ascorbic Acid (VITAMIN C) 1000 MG tablet Take 1,000 mg by mouth daily.   Yes [provider]  bisoprolol (ZEBETA) 5 MG tablet TAKE ONE-HALF TABLET BY  MOUTH DAILY Patient taking differently: Take 2.5 mg by mouth daily. 09/07/21  Yes Rollene Rotunda, MD  Copper Gluconate (COPPER CAPS PO) Take 1 capsule by mouth daily.    Yes [provider]  ergocalciferol (VITAMIN D2) 1.25 MG (50000 UT) capsule Take 50,000 Units by mouth once a week.   Yes [provider]  hydrochlorothiazide (MICROZIDE) 12.5 MG capsule Take 1 capsule (12.5 mg total) by mouth daily. Patient taking differently: Take 12.5 mg by mouth at bedtime. 06/11/21  Yes Gottschalk, Ashly M, DO  levocetirizine (XYZAL) 5 MG tablet TAKE 1 TABLET (5 MG TOTAL) BY MOUTH AT BEDTIME AS NEEDED FOR ALLERGIES (DRAINAGE). 09/03/21  Yes Delynn Flavin M, DO  Magnesium 200 MG TABS Take 1 tablet (200 mg total) by mouth daily. 03/23/19  Yes Jodelle Gross, NP  Multiple Minerals-Vitamins (CALCIUM-MAGNESIUM-ZINC-D3 PO) Take 1 tablet by mouth daily.   Yes [provider]  pantoprazole (PROTONIX) 40 MG tablet TAKE 1 TABLET BY  MOUTH  DAILY Patient taking differently: Take 40 mg by mouth daily. 07/16/21  Yes Gottschalk, Kathie Rhodes M, DO  rosuvastatin (CRESTOR) 10 MG tablet Take 1 tablet (10 mg total) by mouth at bedtime. 06/11/21  Yes Gottschalk, Kathie Rhodes M, DO  traZODone (DESYREL) 50 MG tablet Take 1 tablet (50 mg total) by mouth at bedtime as needed for sleep. 04/02/21  Yes Delynn Flavin M, DO  vitamin E 400 UNIT capsule Take 400 Units by mouth daily.    Yes [provider]    Current Facility-Administered Medications  Medication Dose Route Frequency Provider Last Rate Last Admin   0.9 %  sodium chloride infusion    Intravenous Continuous Zierle-Ghosh, Asia B, DO 100 mL/hr at 09/17/21 0404 Rate Change at 09/17/21 0404   acetaminophen (TYLENOL) tablet 650 mg  650 mg Oral Q6H PRN Zierle-Ghosh, Asia B, DO       Or   acetaminophen (TYLENOL) suppository 650 mg  650 mg Rectal Q6H PRN Zierle-Ghosh, Asia B, DO       bisoprolol (ZEBETA) tablet 2.5 mg  2.5 mg Oral Daily Zierle-Ghosh, Asia B, DO   2.5 mg at 09/17/21 0816   hydrochlorothiazide (HYDRODIURIL) tablet 12.5 mg  12.5 mg Oral QHS Zierle-Ghosh, Asia B, DO       morphine (PF) 2 MG/ML injection 2 mg  2 mg Intravenous Q2H PRN Zierle-Ghosh, Asia B, DO       ondansetron (ZOFRAN) tablet 4 mg  4 mg Oral Q6H PRN Zierle-Ghosh, Asia B, DO       Or   ondansetron (ZOFRAN) injection 4 mg  4 mg Intravenous Q6H PRN Zierle-Ghosh, Asia B, DO       oxyCODONE (Oxy IR/ROXICODONE) immediate release tablet 5 mg  5 mg Oral Q4H PRN Zierle-Ghosh, Asia B, DO       pantoprazole (PROTONIX) injection 40 mg  40 mg Intravenous Q12H Zierle-Ghosh, Asia B, DO   40 mg at 09/17/21 0816   rosuvastatin (CRESTOR) tablet 10 mg  10 mg Oral QHS Zierle-Ghosh, Asia B, DO       traZODone (DESYREL) tablet 50 mg  50 mg Oral QHS PRN Zierle-Ghosh, Asia B, DO        Allergies as of 09/16/2021 - Review Complete 09/16/2021  Allergen Reaction Noted   Sulfa antibiotics Rash 04/29/2019    Family History  Problem Relation Age of Onset   Colon cancer Mother 2       39's   Breast cancer Sister    Asthma Brother    Colon polyps Neg Hx    Esophageal cancer Neg Hx    Rectal cancer Neg Hx    Stomach cancer Neg Hx     Social History   Socioeconomic History   Marital status: Married    Spouse name: Not on file   Number of children: 1   Years of education: Not on file   Highest education level: Not on file  Occupational History   Occupation: retired  Tobacco Use   Smoking status: Former    Types: Cigarettes    Quit date: 12/23/1984    Years since quitting: 36.7   Smokeless tobacco: Never  Vaping  Use   Vaping Use: Never used  Substance and Sexual Activity   Alcohol use: Yes    Comment: rare   Drug use: No   Sexual activity: Not on file  Other Topics Concern   Not on file  Social History Narrative   Patient is married and retired and has  1 grown child   Social Determinants of Health   Financial Resource Strain: Low Risk  (03/21/2021)   Overall Financial Resource Strain (CARDIA)    Difficulty of Paying Living Expenses: Not hard at all  Food Insecurity: No Food Insecurity (03/21/2021)   Hunger Vital Sign    Worried About Running Out of Food in the Last Year: Never true    Ran Out of Food in the Last Year: Never true  Transportation Needs: No Transportation Needs (03/21/2021)   PRAPARE - Administrator, Civil Service (Medical): No    Lack of Transportation (Non-Medical): No  Physical Activity: Sufficiently Active (03/21/2021)   Exercise Vital Sign    Days of Exercise per Week: 1 day    Minutes of Exercise per Session: 150+ min  Stress: No Stress Concern Present (03/21/2021)   [employer] of Occupational Health - Occupational Stress Questionnaire    Feeling of Stress : Only a little  Social Connections: Moderately Integrated (03/21/2021)   Social Connection and Isolation Panel [NHANES]    Frequency of Communication with Friends and Family: More than three times a week    Frequency of Social Gatherings with Friends and Family: More than three times a week    Attends Religious Services: More than 4 times per year    Active Member of [employer] or Organizations: No    Attends Banker Meetings: Never    Marital Status: Married  Catering manager Violence: Not At Risk (03/21/2021)   Humiliation, Afraid, Rape, and Kick questionnaire    Fear of Current or Ex-Partner: No    Emotionally Abused: No    Physically Abused: No    Sexually Abused: No     Review of Systems   Gen: see HPI CV: Denies chest pain, heart palpitations, syncope, edema  Resp:  Denies shortness of breath with rest, cough, wheezing, coughing up blood, and pleurisy. GI: see HPI GU : Denies urinary burning, blood in urine, urinary frequency, and urinary incontinence. MS: see HPI Derm: Denies rash, itching, dry skin, hives. Psych: Denies depression, anxiety, memory loss, hallucinations, and confusion. Heme: see HPI Neuro:  see HPI  Physical Exam   Vital Signs in last 24 hours: Temp:  [97.7 F (36.5 C)-98.1 F (36.7 C)] 98.1 F (36.7 C) (06/26 0500) Pulse Rate:  [61-74] 74 (06/26 0500) Resp:  [15-20] 19 (06/25 2340) BP: (88-130)/(51-63) 116/51 (06/26 0500) SpO2:  [94 %-97 %] 95 % (06/26 0500) Weight:  [93.4 kg] 93.4 kg (06/25 1816) Last BM Date : 09/16/21  General:   Alert,  Well-developed, well-nourished, pleasant and cooperative in NAD Head:  Normocephalic and atraumatic. Eyes:  Sclera clear, no icterus.   Conjunctiva pink. Ears:  Normal auditory acuity. Mouth:  No deformity or lesions, dentition normal. Lungs:  Clear throughout to auscultation.    Heart:  S1 S2 present with systolic murmur Abdomen:  Soft, nontender and nondistended. No masses, hepatosplenomegaly or hernias noted. Normal bowel sounds, without guarding, and without rebound.   Rectal: deferred   Msk:  Symmetrical without gross deformities. Normal posture. Extremities:  Without clubbing or edema. Neurologic:  Alert and  oriented x4. Psych:  Alert and cooperative. Normal mood and affect.  Intake/Output from previous day: 06/25 0701 - 06/26 0700 In: 1185.6 [I.V.:1000; IV Piggyback:185.6] Out: -  Intake/Output this shift: No intake/output data recorded.   Labs/Studies   Recent Labs Recent Labs    09/16/21 1830 09/17/21 0225 09/17/21 0637  WBC 8.6  --  5.6  HGB 9.9* 7.9* 7.8*  HCT 30.0* 24.3* 24.4*  PLT 209  --  155   BMET Recent Labs    09/16/21 1830 09/17/21 0637  NA 136 138  K 3.2* 3.6  CL 100 107  CO2 27 25  GLUCOSE 105* 106*  BUN 22 17  CREATININE 0.84 0.75   CALCIUM 8.7* 8.0*   LFT Recent Labs    09/16/21 1830 09/17/21 0637  PROT 6.7 5.5*  ALBUMIN 4.1 3.3*  AST 27 22  ALT 17 14  ALKPHOS 35* 23*  BILITOT 0.9 0.9     Radiology/Studies DG Chest Port 1 View  Result Date: 09/16/2021 CLINICAL DATA:  Shortness of breath for 2 days EXAM: PORTABLE CHEST 1 VIEW COMPARISON:  12/11/2026 FINDINGS: Cardiac shadow is within normal limits. Aortic calcifications are seen. Lungs are clear bilaterally. Mild chronic interstitial changes are noted. No acute bony abnormality is seen. IMPRESSION: No active disease. Electronically Signed   By: Alcide Clever M.D.   On: 09/16/2021 19:05     Assessment   FLOETTA CHIAPPA is a 76 y.o. year old female  well known to this GI service with a history of IDA in setting of chronic blood loss, known AVMs, multiple admissions requiring blood transfusions and multiple endoscopic procedures over past few years as noted above, now presenting with symptomatic anemia in setting of recurrent GI bleeding.  GI bleed: last hospitalization Sept 2022 and undergoing EGD with subsequent transfer to [facility] whereby she underwent coil embolization of inferior pancreaticoduodenal artery from SMA and exoseal for hemostasis. She had done well since that time without need for IV iron. Now with presenting Hgb 9.9 yesterday and down to 7.8 this morning. Last episode of melena this morning. Notably, her Hgb was 11.1 several days ago as outpatient and baseline had been in the 13 range consistently for past 6 months. Denies any NSAIDs.   Will need EGD today. May ultimately need IR intervention but remains to be seen. Discussed risks and benefits with patient, who stated understanding. She is appropriate for procedure today.    Plan / Recommendations    Remain NPO Proceed with upper endoscopy by Dr. Marletta Lor today.  Continue PPI BID Transfuse as needed Further recommendations to follow      09/17/2021, 8:45 AM  Gelene Mink, PhD,  [facility] Gastroenterology

## 2021-09-17 NOTE — Progress Notes (Signed)
As per GI, EGD showed coffee-ground-like material in the duodenal bulb and first, second and third portion of duodenum.  Area was lavaged.  Bleeding source likely Dieulafoy lesion, bleeding has stopped at this time.  No active bleeding identified. Patient started on soft diet, p.o. PPI. If further evidence of bleeding patient will need stat CTA abdomen to notify possible source

## 2021-09-17 NOTE — Transfer of Care (Signed)
Immediate Anesthesia Transfer of Care Note  Patient: Stacey Reyes  Procedure(s) Performed: ESOPHAGOGASTRODUODENOSCOPY (EGD) WITH PROPOFOL  Patient Location: PACU  Anesthesia Type:MAC  Level of Consciousness: awake, alert  and oriented  Airway & Oxygen Therapy: Patient Spontanous Breathing  Post-op Assessment: Report given to RN, Post -op Vital signs reviewed and stable and Patient moving all extremities X 4  Post vital signs: Reviewed and stable  Last Vitals:  Vitals Value Taken Time  BP 122/66 09/17/21 1345  Temp    Pulse 78 09/17/21 1345  Resp 17 09/17/21 1345  SpO2 96 % 09/17/21 1345  Vitals shown include unvalidated device data.  Last Pain:  Vitals:   09/17/21 1326  TempSrc:   PainSc: 0-No pain      Patients Stated Pain Goal: 6 (09/17/21 1211)  Complications: No notable events documented.

## 2021-09-18 DIAGNOSIS — K921 Melena: Secondary | ICD-10-CM | POA: Diagnosis not present

## 2021-09-18 DIAGNOSIS — E782 Mixed hyperlipidemia: Secondary | ICD-10-CM | POA: Diagnosis not present

## 2021-09-18 DIAGNOSIS — I10 Essential (primary) hypertension: Secondary | ICD-10-CM | POA: Diagnosis not present

## 2021-09-18 DIAGNOSIS — K922 Gastrointestinal hemorrhage, unspecified: Secondary | ICD-10-CM | POA: Diagnosis not present

## 2021-09-18 DIAGNOSIS — D62 Acute posthemorrhagic anemia: Secondary | ICD-10-CM | POA: Diagnosis not present

## 2021-09-18 LAB — CBC
HCT: 23.1 % — ABNORMAL LOW (ref 36.0–46.0)
Hemoglobin: 7.5 g/dL — ABNORMAL LOW (ref 12.0–15.0)
MCH: 30.9 pg (ref 26.0–34.0)
MCHC: 32.5 g/dL (ref 30.0–36.0)
MCV: 95.1 fL (ref 80.0–100.0)
Platelets: 155 10*3/uL (ref 150–400)
RBC: 2.43 MIL/uL — ABNORMAL LOW (ref 3.87–5.11)
RDW: 14.9 % (ref 11.5–15.5)
WBC: 5.6 10*3/uL (ref 4.0–10.5)
nRBC: 0 % (ref 0.0–0.2)

## 2021-09-18 MED ORDER — PANTOPRAZOLE SODIUM 40 MG PO TBEC: 40.0000 mg | DELAYED_RELEASE_TABLET | Freq: Two times a day (BID) | ORAL | Status: DC

## 2021-09-18 MED ORDER — PANTOPRAZOLE SODIUM 40 MG PO TBEC
40.0000 mg | DELAYED_RELEASE_TABLET | Freq: Two times a day (BID) | ORAL | Status: DC
  Administered 2021-09-18 – 2021-09-19 (×2): 40 mg via ORAL
  Filled 2021-09-18 (×2): qty 1

## 2021-09-19 ENCOUNTER — Encounter (HOSPITAL_COMMUNITY): Payer: Self-pay | Admitting: Internal Medicine

## 2021-09-19 DIAGNOSIS — E876 Hypokalemia: Secondary | ICD-10-CM | POA: Diagnosis not present

## 2021-09-19 DIAGNOSIS — D649 Anemia, unspecified: Secondary | ICD-10-CM | POA: Diagnosis not present

## 2021-09-19 DIAGNOSIS — D62 Acute posthemorrhagic anemia: Secondary | ICD-10-CM | POA: Diagnosis not present

## 2021-09-19 DIAGNOSIS — K922 Gastrointestinal hemorrhage, unspecified: Secondary | ICD-10-CM | POA: Diagnosis not present

## 2021-09-19 LAB — BASIC METABOLIC PANEL
Anion gap: 4 — ABNORMAL LOW (ref 5–15)
BUN: 12 mg/dL (ref 8–23)
CO2: 27 mmol/L (ref 22–32)
Calcium: 8.6 mg/dL — ABNORMAL LOW (ref 8.9–10.3)
Chloride: 107 mmol/L (ref 98–111)
Creatinine, Ser: 0.69 mg/dL (ref 0.44–1.00)
GFR, Estimated: >60 mL/min (ref >60)
Glucose, Bld: 110 mg/dL — ABNORMAL HIGH (ref 70–99)
Potassium: 3.9 mmol/L (ref 3.5–5.1)
Sodium: 138 mmol/L (ref 135–145)

## 2021-09-19 LAB — CBC
HCT: 26 % — ABNORMAL LOW (ref 36.0–46.0)
Hemoglobin: 8.5 g/dL — ABNORMAL LOW (ref 12.0–15.0)
MCH: 30.4 pg (ref 26.0–34.0)
MCHC: 32.7 g/dL (ref 30.0–36.0)
MCV: 92.9 fL (ref 80.0–100.0)
Platelets: 203 10*3/uL (ref 150–400)
RBC: 2.8 MIL/uL — ABNORMAL LOW (ref 3.87–5.11)
RDW: 15.2 % (ref 11.5–15.5)
WBC: 7.3 10*3/uL (ref 4.0–10.5)
nRBC: 0 % (ref 0.0–0.2)

## 2021-09-19 MED ORDER — PANTOPRAZOLE SODIUM 40 MG PO TBEC: 40.0000 mg | DELAYED_RELEASE_TABLET | Freq: Two times a day (BID) | ORAL | 1 refills | Status: DC

## 2021-09-19 NOTE — Care Management Important Message (Signed)
Important Message  Patient Details  Name: Stacey Reyes MRN: [id number] Date of Birth: 26-Jun-1945   Medicare Important Message Given:  N/A - LOS <3 / Initial given by admissions     Tommy Medal 09/19/2021, 11:04 AM

## 2021-09-19 NOTE — Progress Notes (Signed)
Stacey Reyes, M.D. Gastroenterology & Hepatology   Interval History:  No acute events overnight.  Patient reports that she ate breakfast without any nausea, vomiting.  Has not presenting any more episodes of melena or hematochezia.  No more lightheadedness, dizziness or shortness of breath. Repeat hemoglobin today was 8.5 and BUN has remained low at 12.  Inpatient Medications:  Current Facility-Administered Medications:    0.9 %  sodium chloride infusion, , Intravenous, Continuous, Zierle-Ghosh, Asia B, DO, Stopped at 09/18/21 2040   acetaminophen (TYLENOL) tablet 650 mg, 650 mg, Oral, Q6H PRN **OR** acetaminophen (TYLENOL) suppository 650 mg, 650 mg, Rectal, Q6H PRN, Zierle-Ghosh, Asia B, DO   bisoprolol (ZEBETA) tablet 2.5 mg, 2.5 mg, Oral, Daily, Zierle-Ghosh, Asia B, DO, 2.5 mg at 09/19/21 0908   hydrochlorothiazide (HYDRODIURIL) tablet 12.5 mg, 12.5 mg, Oral, QHS, Zierle-Ghosh, Asia B, DO, 12.5 mg at 09/18/21 2035   morphine (PF) 2 MG/ML injection 2 mg, 2 mg, Intravenous, Q2H PRN, Zierle-Ghosh, Asia B, DO   ondansetron (ZOFRAN) tablet 4 mg, 4 mg, Oral, Q6H PRN **OR** ondansetron (ZOFRAN) injection 4 mg, 4 mg, Intravenous, Q6H PRN, Zierle-Ghosh, Asia B, DO   oxyCODONE (Oxy IR/ROXICODONE) immediate release tablet 5 mg, 5 mg, Oral, Q4H PRN, Zierle-Ghosh, Asia B, DO   pantoprazole (PROTONIX) EC tablet 40 mg, 40 mg, Oral, BID, Harper, Kristen S, PA-C, 40 mg at 09/19/21 0908   rosuvastatin (CRESTOR) tablet 10 mg, 10 mg, Oral, QHS, Zierle-Ghosh, Asia B, DO, 10 mg at 09/18/21 2035   traZODone (DESYREL) tablet 50 mg, 50 mg, Oral, QHS PRN, Zierle-Ghosh, Asia B, DO  Current Outpatient Medications:    Ascorbic Acid (VITAMIN C) 1000 MG tablet, Take 1,000 mg by mouth daily., Disp: , Rfl:    bisoprolol (ZEBETA) 5 MG tablet, TAKE ONE-HALF TABLET BY  MOUTH DAILY (Patient taking differently: Take 2.5 mg by mouth daily.), Disp: 45 tablet, Rfl: 3   Copper Gluconate (COPPER CAPS PO), Take 1 capsule by  mouth daily. , Disp: , Rfl:    ergocalciferol (VITAMIN D2) 1.25 MG (50000 UT) capsule, Take 50,000 Units by mouth once a week., Disp: , Rfl:    hydrochlorothiazide (MICROZIDE) 12.5 MG capsule, Take 1 capsule (12.5 mg total) by mouth daily. (Patient taking differently: Take 12.5 mg by mouth at bedtime.), Disp: 90 capsule, Rfl: 3   levocetirizine (XYZAL) 5 MG tablet, TAKE 1 TABLET (5 MG TOTAL) BY MOUTH AT BEDTIME AS NEEDED FOR ALLERGIES (DRAINAGE)., Disp: 90 tablet, Rfl: 1   Magnesium 200 MG TABS, Take 1 tablet (200 mg total) by mouth daily., Disp: 30 tablet, Rfl: 6   Multiple Minerals-Vitamins (CALCIUM-MAGNESIUM-ZINC-D3 PO), Take 1 tablet by mouth daily., Disp: , Rfl:    rosuvastatin (CRESTOR) 10 MG tablet, Take 1 tablet (10 mg total) by mouth at bedtime., Disp: 90 tablet, Rfl: 3   traZODone (DESYREL) 50 MG tablet, Take 1 tablet (50 mg total) by mouth at bedtime as needed for sleep., Disp: 90 tablet, Rfl: 3   vitamin E 400 UNIT capsule, Take 400 Units by mouth daily. , Disp: , Rfl:    pantoprazole (PROTONIX) 40 MG tablet, Take 1 tablet (40 mg total) by mouth 2 (two) times daily., Disp: 60 tablet, Rfl: 1   I/O   No intake or output data in the 24 hours ending 09/19/21 1200   Physical Exam: Temp:  [98.8 F (37.1 C)] 98.8 F (37.1 C) (06/27 1337) Pulse Rate:  [80] 80 (06/27 1337) Resp:  [20] 20 (06/27 1337) BP: (126)/(64) 126/64 (06/27   1337) SpO2:  [95 %] 95 % (06/27 1337)  Temp (24hrs), Avg:98.8 F (37.1 C), Min:98.8 F (37.1 C), Max:98.8 F (37.1 C) GENERAL: The patient is AO x3, in no acute distress. HEENT: Head is normocephalic and atraumatic. EOMI are intact. Mouth is well hydrated and without lesions. NECK: Supple. No masses LUNGS: Clear to auscultation. No presence of rhonchi/wheezing/rales. Adequate chest expansion HEART: RRR, normal s1 and s2. ABDOMEN: Soft, nontender, no guarding, no peritoneal signs, and nondistended. BS +. No masses. EXTREMITIES: Without any cyanosis,  clubbing, rash, lesions or edema. NEUROLOGIC: AOx3, no focal motor deficit. SKIN: no jaundice, no rashes  Laboratory Data: CBC:     Component Value Date/Time   WBC 7.3 09/19/2021 0523   RBC 2.80 (L) 09/19/2021 0523   HGB 8.5 (L) 09/19/2021 0523   HGB 13.4 06/19/2021 1009   HCT 26.0 (L) 09/19/2021 0523   HCT 39.0 06/19/2021 1009   PLT 203 09/19/2021 0523   PLT 222 06/19/2021 1009   MCV 92.9 09/19/2021 0523   MCV 91 06/19/2021 1009   MCH 30.4 09/19/2021 0523   MCHC 32.7 09/19/2021 0523   RDW 15.2 09/19/2021 0523   RDW 12.5 06/19/2021 1009   LYMPHSABS 1.9 09/17/2021 0637   LYMPHSABS 1.8 06/19/2021 1009   MONOABS 0.6 09/17/2021 0637   EOSABS 0.2 09/17/2021 0637   EOSABS 0.2 06/19/2021 1009   BASOSABS 0.0 09/17/2021 0637   BASOSABS 0.1 06/19/2021 1009   COAG:  Lab Results  Component Value Date   INR 1.1 12/10/2020   INR 1.1 10/20/2020   INR 1.0 10/19/2020    BMP:     Latest Ref Rng & Units 09/19/2021    5:23 AM 09/17/2021    6:37 AM 09/16/2021    6:30 PM  BMP  Glucose 70 - 99 mg/dL 110  106  105   BUN 8 - 23 mg/dL _0 Creatinine 0.44 - 1.00 mg/dL 0.69  0.75  0.84   Sodium 135 - 145 mmol/L 138  138  136   Potassium 3.5 - 5.1 mmol/L 3.9  3.6  3.2   Chloride 98 - 111 mmol/L 107  107  100   CO2 22 - 32 mmol/L _1 Calcium 8.9 - 10.3 mg/dL 8.6  8.0  8.7     HEPATIC:     Latest Ref Rng & Units 09/17/2021    6:37 AM 09/16/2021    6:30 PM 06/12/2021    8:10 AM  Hepatic Function  Total Protein 6.5 - 8.1 g/dL 5.5  6.7  6.4   Albumin 3.5 - 5.0 g/dL 3.3  4.1  4.4   AST 15 - 41 U/L _2 ALT 0 - 44 U/L _3 Alk Phosphatase 38 - 126 U/L 23  35  40   Total Bilirubin 0.3 - 1.2 mg/dL 0.9  0.9  0.6     CARDIAC: No results found for: "CKTOTAL", "CKMB", "CKMBINDEX", "TROPONINI"    Imaging: I personally reviewed and interpreted the available labs, imaging and endoscopic files.   Assessment/Plan: 76 y.o. year old female with a history of IDA in  setting of chronic blood loss, known AVMs, multiple admissions requiring blood transfusions and multiple endoscopic procedures over past few years including multiple EGDs, colonoscopies, capsule studies, and 2 IR interventions in September 2022 (all detailed in consult note), now presenting with symptomatic anemia in the setting of recurrent  GI bleeding.  The patient underwent endoscopic evaluation of her upper GI tract during this admission which was positive for presence of fresh blood in the lumen of the second and third portion of the duodenum but without evident source of active bleeding.  Most likely her bleeding corresponds to recurrent bleeding from a Dieulafoy lesion.  She has not presented any recurrent clinical bleeding in the last 48 hours and his BUN remains low which is reassuring.  She can be discharged home.  She will need to follow with her primary gastroenterologist.  May consider conjoint discussion with interventional radiology for possible provocative angiography given recurrent episodes of bleeding through the years.  - Advance diet as tolerated - Continue PPI qday - Follow up with GI as outpatient, consider conjoint discussion with interventional radiology for possible provocative angiography  Stacey Castaneda, MD Gastroenterology and Hepatology [facility]  

## 2021-09-19 NOTE — Discharge Summary (Signed)
Physician Discharge Summary   Patient: Stacey Reyes MRN: [id number] DOB: December 25, 1945  Admit date:     09/16/2021  Discharge date: 09/19/21  Discharge Physician: Shanon Brow Lemoine Goyne   PCP: [name], DO   Recommendations at discharge:   Please follow up with primary care provider within 1-2 weeks  Please repeat BMP and CBC in one week  Discharge Diagnoses: Principal Problem:   Upper GI bleed Active Problems:   Hypertension, essential   Hyperlipidemia   Symptomatic anemia   Hypokalemia  Resolved Problems:   * No resolved hospital problems. *  Hospital Course: 76 year old female with medical history of anemia, diverticulitis, heart murmur, hyperlipidemia, hypertension, colonic polyps came to ED with complaints of back pain and dizziness.  Patient also has been having melena for past 7 to 10 days.  Patient states that every BM has been black.  Denies any bright red blood.  Also complains of fatigue, dyspnea and back pain.  EGD Sept 13, 2022: normal GE junction, multiple gastric polyps, blood in second portion of duodenum suspected secondary to Dieulafoy lesion.  IR 9/13: empiric percutaneous coil embolization of the GDA and a hypertrophied pancreaticoduodenal artery EGD 12/11/20: blood in duodenal bulb and second portion, one bleeding AVM s/p epi but unsuccessful, s/p clips X 3 and hemospray.  IR 12/13/20: coil embolization of inferior pancreaticoduodenal artery from SMA and exoseal for hemostasis.   Assessment and Plan: * Upper GI bleed Hgb 9.9 at time of admission, down to 7.5 nadir Appreciate GI consult 6/26 EGD---blood in the duodenal bulb and the second and third portion of the duodenum but no presence of active bleeding source -it was considered that she had a Dieulafoy lesion leading to her initial presentation. Diet advanced after EGD which pt tolerated protonix bid  Hypokalemia -K+ 3.2 -Replaced  Symptomatic anemia -Hgb down to 9.9 from 13s baseline -2/2 acute  blood loss; Melena for several days  -Typed and screened -[facility] at midnight and with morning labs -transfuse < 8.0 -nadir Hgb 7.5 -Hgb 8.5 on day of d/c  Hyperlipidemia -Continue crestor  Hypertension, essential Continue bisoprolol and HCTZ         Consultants: GI Procedures performed: none  Disposition: Home Diet recommendation:  Cardiac diet DISCHARGE MEDICATION: Allergies as of 09/19/2021       Reactions   Sulfa Antibiotics Rash        Medication List     TAKE these medications    bisoprolol 5 MG tablet Commonly known as: ZEBETA TAKE ONE-HALF TABLET BY  MOUTH DAILY   CALCIUM-MAGNESIUM-ZINC-D3 PO Take 1 tablet by mouth daily.   COPPER CAPS PO Take 1 capsule by mouth daily.   ergocalciferol 1.25 MG (50000 UT) capsule Commonly known as: VITAMIN D2 Take 50,000 Units by mouth once a week.   hydrochlorothiazide 12.5 MG capsule Commonly known as: MICROZIDE Take 1 capsule (12.5 mg total) by mouth daily. What changed: when to take this   levocetirizine 5 MG tablet Commonly known as: XYZAL TAKE 1 TABLET (5 MG TOTAL) BY MOUTH AT BEDTIME AS NEEDED FOR ALLERGIES (DRAINAGE).   Magnesium 200 MG Tabs Take 1 tablet (200 mg total) by mouth daily.   pantoprazole 40 MG tablet Commonly known as: PROTONIX Take 1 tablet (40 mg total) by mouth 2 (two) times daily. What changed:  how much to take how to take this when to take this additional instructions   rosuvastatin 10 MG tablet Commonly known as: CRESTOR Take 1 tablet (10 mg total) by  mouth at bedtime.   traZODone 50 MG tablet Commonly known as: DESYREL Take 1 tablet (50 mg total) by mouth at bedtime as needed for sleep.   vitamin C 1000 MG tablet Take 1,000 mg by mouth daily.   vitamin E 180 MG (400 UNITS) capsule Take 400 Units by mouth daily.        Discharge Exam: Filed Weights   09/16/21 1816 09/17/21 1211  Weight: 93.4 kg 93.4 kg   HEENT:  [place]/AT, No thrush, no icterus CV:  RRR, no  rub, no S3, no S4 Lung:  CTA, no wheeze, no rhonchi Abd:  soft/+BS, NT Ext:  No edema, no lymphangitis, no synovitis, no rash   Condition at discharge: stable  The results of significant diagnostics from this hospitalization (including imaging, microbiology, ancillary and laboratory) are listed below for reference.   Imaging Studies: DG Chest Port 1 View  Result Date: 09/16/2021 CLINICAL DATA:  Shortness of breath for 2 days EXAM: PORTABLE CHEST 1 VIEW COMPARISON:  12/11/2026 FINDINGS: Cardiac shadow is within normal limits. Aortic calcifications are seen. Lungs are clear bilaterally. Mild chronic interstitial changes are noted. No acute bony abnormality is seen. IMPRESSION: No active disease. Electronically Signed   By: Inez Catalina M.D.   On: 09/16/2021 19:05    Microbiology: Results for orders placed or performed during the hospital encounter of 12/04/20  SARS CORONAVIRUS 2 ([place]ely [place] 6-24 HRS) Nasopharyngeal Nasopharyngeal Swab     Status: None   Collection Time: 12/04/20  2:30 PM   Specimen: Nasopharyngeal Swab  Result Value Ref [place] Status   SARS Coronavirus 2 NEGATIVE NEGATIVE Final    Comment: (NOTE) SARS-CoV-2 target nucleic acids are NOT DETECTED.  The SARS-CoV-2 RNA is generally detectable in upper and lower respiratory specimens during the acute phase of infection. Negative results do not preclude SARS-CoV-2 infection, do not rule out co-infections with other pathogens, and should not be used as the sole basis for treatment or other patient management decisions. Negative results must be combined with clinical observations, patient history, and epidemiological information. The expected result is Negative.  Fact Sheet for Patients: SugarRoll.be  Fact Sheet for Healthcare Providers: https://www.woods-mathews.com/  This test is not yet approved or cleared by the [place] FDA and  has been authorized for detection and/or  diagnosis of SARS-CoV-2 by FDA under an Emergency Use Authorization (EUA). This EUA will remain  in effect (meaning this test can be used) for the duration of the COVID-19 declaration under Se ction 564(b)(1) of the Act, 21 U.S.C. section 360bbb-3(b)(1), unless the authorization is terminated or revoked sooner.  Performed at [facility], [place] [address] [place], [place] [address]   MRSA Next Gen by PCR, Nasal     Status: None   Collection Time: 12/05/20  8:37 PM   Specimen: Nasal Mucosa; Nasal Swab  Result Value Ref [place] Status   MRSA by PCR Next Gen NOT DETECTED NOT DETECTED Final    Comment: (NOTE) The GeneXpert MRSA Assay (FDA approved for NASAL specimens only), is one component of a comprehensive MRSA colonization surveillance program. It is not intended to diagnose MRSA infection nor to guide or monitor treatment for MRSA infections. Test performance is not FDA approved in patients less than 38 years old. Performed at [facility], [address], [place], [place] [address]     Labs: CBC: Recent Labs  Lab 09/16/21 1830 09/17/21 0225 09/17/21 0637 09/18/21 0445 09/19/21 0523  WBC 8.6  --  5.6 5.6 7.3  NEUTROABS 4.3  --  2.8  --   --   HGB 9.9* 7.9* 7.8* 7.5* 8.5*  HCT 30.0* 24.3* 24.4* 23.1* 26.0*  MCV 90.9  --  93.1 95.1 92.9  PLT 209  --  155 155 567   Basic Metabolic Panel: Recent Labs  Lab 09/16/21 1830 09/17/21 0637 09/19/21 0523  NA 136 138 138  K 3.2* 3.6 3.9  CL 100 107 107  CO2 '27 25 27  '$ GLUCOSE 105* 106* 110*  BUN '22 17 12  '$ CREATININE 0.84 0.75 0.69  CALCIUM 8.7* 8.0* 8.6*  MG  --  1.9  --    Liver Function Tests: Recent Labs  Lab 09/16/21 1830 09/17/21 0637  AST 27 22  ALT 17 14  ALKPHOS 35* 23*  BILITOT 0.9 0.9  PROT 6.7 5.5*  ALBUMIN 4.1 3.3*   CBG: No results for input(s): "GLUCAP" in the last 168 hours.  Discharge time spent: greater than 30 minutes.  Signed: Orson Eva, MD [facility] 09/19/2021

## 2021-09-19 NOTE — Assessment & Plan Note (Addendum)
Continue bisoprolol and HCTZ

## 2021-09-19 NOTE — Progress Notes (Signed)
Patient discharged home today, transported home by family. Discharge paperwork went over with patient, patient verbalized understanding. Belongings sent home with patient.  ?

## 2021-09-19 NOTE — Assessment & Plan Note (Signed)
Hgb 9.9 at time of admission, down to 7.5 nadir Appreciate GI consult 6/26 EGD---blood in the duodenal bulb and the second and third portion of the duodenum but no presence of active bleeding source -it was considered that she had a Dieulafoy lesion leading to her initial presentation. Diet advanced after EGD which pt tolerated protonix bid

## 2021-09-19 NOTE — Anesthesia Postprocedure Evaluation (Signed)
Anesthesia Post Note  Patient: Stacey Reyes  Procedure(s) Performed: ESOPHAGOGASTRODUODENOSCOPY (EGD) WITH PROPOFOL  Patient location during evaluation: Phase II Anesthesia Type: General Level of consciousness: awake Pain management: pain level controlled Vital Signs Assessment: post-procedure vital signs reviewed and stable Respiratory status: spontaneous breathing and respiratory function stable Cardiovascular status: blood pressure returned to baseline and stable Postop Assessment: no headache and no apparent nausea or vomiting Anesthetic complications: no Comments: Late entry   No notable events documented.   Last Vitals:  Vitals:   09/18/21 0251 09/18/21 1337  BP: (!) 145/70 126/64  Pulse: 88 80  Resp: 17 20  Temp: 36.7 C 37.1 C  SpO2: 95% 95%    Last Pain:  Vitals:   09/18/21 2028  TempSrc:   PainSc: 0-No pain                 Louann Sjogren

## 2021-09-20 ENCOUNTER — Telehealth: Payer: Self-pay

## 2021-09-20 NOTE — Telephone Encounter (Addendum)
Transition Care Management Follow-up Telephone Call Date of discharge and from where: 09/19/21 - [facility] - upper GI bleed How have you been since you were released from the hospital? Feels pretty good now Any questions or concerns? No  Items Reviewed: Did the pt receive and understand the discharge instructions provided? Yes  Medications obtained and verified? Yes  Other? No  Any new allergies since your discharge? No  Dietary orders reviewed? Yes Do you have support at home? Yes   Home Care and Equipment/Supplies: Were home health services ordered? No  Were any new equipment or medical supplies ordered?  No   Functional Questionnaire: (I = Independent and D = Dependent) ADLs: I  Bathing/Dressing- I  Meal Prep- I  Eating- I  Maintaining continence- I  Transferring/Ambulation- I  Managing Meds- I  Follow up appointments reviewed:  PCP Hospital f/u appt confirmed? Yes  Scheduled to see Dr Warrick Parisian (as Lajuana Ripple not available) on 09/26/21 @ 9:50. [facility] f/u appt confirmed? Yes  Scheduled to see Casey Burkitt on 10/16/21 @ 11. Are transportation arrangements needed? No  If their condition worsens, is the pt aware to call PCP or go to the Emergency Dept.? Yes Was the patient provided with contact information for the PCP's office or ED? Yes Was to pt encouraged to call back with questions or concerns? Yes

## 2021-09-24 ENCOUNTER — Other Ambulatory Visit: Payer: Medicare Other

## 2021-09-24 ENCOUNTER — Telehealth: Payer: Self-pay

## 2021-09-24 ENCOUNTER — Other Ambulatory Visit: Payer: Self-pay

## 2021-09-24 DIAGNOSIS — K922 Gastrointestinal hemorrhage, unspecified: Secondary | ICD-10-CM

## 2021-09-24 DIAGNOSIS — D509 Iron deficiency anemia, unspecified: Secondary | ICD-10-CM | POA: Diagnosis not present

## 2021-09-24 LAB — HEMOGLOBIN, FINGERSTICK: Hemoglobin: 7.7 g/dL — ABNORMAL LOW (ref 11.1–15.9)

## 2021-09-24 NOTE — Telephone Encounter (Signed)
Returned the pt's call and was advised by here that at the ED she was advised to see Dr Christophe Louis and they advised her to call here. I advised the pt that she has not been seen here and I'm at a loss because I wasn't sent anything on her. Can you advise what's going on.

## 2021-09-25 ENCOUNTER — Encounter (HOSPITAL_COMMUNITY): Payer: Self-pay

## 2021-09-25 ENCOUNTER — Other Ambulatory Visit: Payer: Self-pay

## 2021-09-25 ENCOUNTER — Observation Stay (HOSPITAL_COMMUNITY): Payer: Medicare Other

## 2021-09-25 ENCOUNTER — Observation Stay (HOSPITAL_COMMUNITY)
Admission: EM | Admit: 2021-09-25 | Discharge: 2021-09-27 | Disposition: A | Discharge status: Home / Self Care | Payer: Medicare Other | Attending: Family Medicine | Admitting: Family Medicine

## 2021-09-25 DIAGNOSIS — K317 Polyp of stomach and duodenum: Secondary | ICD-10-CM | POA: Insufficient documentation

## 2021-09-25 DIAGNOSIS — K922 Gastrointestinal hemorrhage, unspecified: Principal | ICD-10-CM | POA: Insufficient documentation

## 2021-09-25 DIAGNOSIS — Z8719 Personal history of other diseases of the digestive system: Secondary | ICD-10-CM

## 2021-09-25 DIAGNOSIS — E669 Obesity, unspecified: Secondary | ICD-10-CM | POA: Diagnosis not present

## 2021-09-25 DIAGNOSIS — I10 Essential (primary) hypertension: Secondary | ICD-10-CM | POA: Diagnosis present

## 2021-09-25 DIAGNOSIS — D62 Acute posthemorrhagic anemia: Secondary | ICD-10-CM | POA: Diagnosis not present

## 2021-09-25 DIAGNOSIS — I6523 Occlusion and stenosis of bilateral carotid arteries: Secondary | ICD-10-CM | POA: Diagnosis not present

## 2021-09-25 DIAGNOSIS — Z6832 Body mass index (BMI) 32.0-32.9, adult: Secondary | ICD-10-CM | POA: Diagnosis not present

## 2021-09-25 DIAGNOSIS — E782 Mixed hyperlipidemia: Secondary | ICD-10-CM | POA: Diagnosis not present

## 2021-09-25 DIAGNOSIS — D649 Anemia, unspecified: Secondary | ICD-10-CM | POA: Diagnosis not present

## 2021-09-25 DIAGNOSIS — M6281 Muscle weakness (generalized): Secondary | ICD-10-CM | POA: Insufficient documentation

## 2021-09-25 DIAGNOSIS — Z87891 Personal history of nicotine dependence: Secondary | ICD-10-CM | POA: Diagnosis not present

## 2021-09-25 DIAGNOSIS — E785 Hyperlipidemia, unspecified: Secondary | ICD-10-CM | POA: Diagnosis present

## 2021-09-25 DIAGNOSIS — Z79899 Other long term (current) drug therapy: Secondary | ICD-10-CM | POA: Diagnosis not present

## 2021-09-25 DIAGNOSIS — I639 Cerebral infarction, unspecified: Secondary | ICD-10-CM | POA: Diagnosis not present

## 2021-09-25 DIAGNOSIS — G459 Transient cerebral ischemic attack, unspecified: Secondary | ICD-10-CM | POA: Diagnosis not present

## 2021-09-25 LAB — CBC
HCT: 20.3 % — ABNORMAL LOW (ref 36.0–46.0)
Hemoglobin: 6.2 g/dL — CL (ref 12.0–15.0)
MCH: 29 pg (ref 26.0–34.0)
MCHC: 30.5 g/dL (ref 30.0–36.0)
MCV: 94.9 fL (ref 80.0–100.0)
Platelets: 205 10*3/uL (ref 150–400)
RBC: 2.14 MIL/uL — ABNORMAL LOW (ref 3.87–5.11)
RDW: 15.4 % (ref 11.5–15.5)
WBC: 5.4 10*3/uL (ref 4.0–10.5)
nRBC: 0 % (ref 0.0–0.2)

## 2021-09-25 LAB — COMPREHENSIVE METABOLIC PANEL
ALT: 14 U/L (ref 0–44)
AST: 29 U/L (ref 15–41)
Albumin: 3.4 g/dL — ABNORMAL LOW (ref 3.5–5.0)
Alkaline Phosphatase: 20 U/L — ABNORMAL LOW (ref 38–126)
Anion gap: 13 (ref 5–15)
BUN: 18 mg/dL (ref 8–23)
CO2: 24 mmol/L (ref 22–32)
Calcium: 9.2 mg/dL (ref 8.9–10.3)
Chloride: 103 mmol/L (ref 98–111)
Creatinine, Ser: 0.8 mg/dL (ref 0.44–1.00)
GFR, Estimated: >60 mL/min (ref >60)
Glucose, Bld: 110 mg/dL — ABNORMAL HIGH (ref 70–99)
Potassium: 3.8 mmol/L (ref 3.5–5.1)
Sodium: 140 mmol/L (ref 135–145)
Total Bilirubin: 0.6 mg/dL (ref 0.3–1.2)
Total Protein: 5.5 g/dL — ABNORMAL LOW (ref 6.5–8.1)

## 2021-09-25 LAB — PREPARE RBC (CROSSMATCH)

## 2021-09-25 LAB — PROTIME-INR
INR: 1.1 (ref 0.8–1.2)
Prothrombin Time: 14 seconds (ref 11.4–15.2)

## 2021-09-25 LAB — HEMOGLOBIN AND HEMATOCRIT, BLOOD
HCT: 22.9 % — ABNORMAL LOW (ref 36.0–46.0)
Hemoglobin: 7.7 g/dL — ABNORMAL LOW (ref 12.0–15.0)

## 2021-09-25 LAB — GLUCOSE, CAPILLARY: Glucose-Capillary: 90 mg/dL (ref 70–99)

## 2021-09-25 MED ORDER — ALBUTEROL SULFATE (2.5 MG/3ML) 0.083% IN NEBU: 2.5000 mg | INHALATION_SOLUTION | Freq: Four times a day (QID) | RESPIRATORY_TRACT | Status: DC | PRN

## 2021-09-25 MED ORDER — LEVOCETIRIZINE DIHYDROCHLORIDE 5 MG PO TABS: 5.0000 mg | ORAL_TABLET | Freq: Every evening | ORAL | Status: DC | PRN

## 2021-09-25 MED ORDER — ONDANSETRON HCL 4 MG PO TABS: 4.0000 mg | ORAL_TABLET | Freq: Four times a day (QID) | ORAL | Status: DC | PRN

## 2021-09-25 MED ORDER — PANTOPRAZOLE SODIUM 40 MG IV SOLR
40.0000 mg | Freq: Two times a day (BID) | INTRAVENOUS | Status: DC
  Administered 2021-09-25 – 2021-09-27 (×4): 40 mg via INTRAVENOUS
  Filled 2021-09-25 (×4): qty 10

## 2021-09-25 MED ORDER — LORATADINE 10 MG PO TABS
10.0000 mg | ORAL_TABLET | Freq: Every evening | ORAL | Status: DC | PRN
Start: 2021-09-25 — End: 2021-09-27

## 2021-09-25 MED ORDER — ACETAMINOPHEN 650 MG RE SUPP: 650.0000 mg | Freq: Four times a day (QID) | RECTAL | Status: DC | PRN

## 2021-09-25 MED ORDER — IOHEXOL 350 MG/ML SOLN
75.0000 mL | Freq: Once | INTRAVENOUS | Status: AC | PRN
  Administered 2021-09-25: 75 mL via INTRAVENOUS

## 2021-09-25 MED ORDER — SODIUM CHLORIDE 0.9% FLUSH
3.0000 mL | Freq: Two times a day (BID) | INTRAVENOUS | Status: DC
  Administered 2021-09-25 – 2021-09-27 (×3): 3 mL via INTRAVENOUS

## 2021-09-25 MED ORDER — TRAZODONE HCL 50 MG PO TABS
50.0000 mg | ORAL_TABLET | Freq: Every evening | ORAL | Status: DC | PRN
Start: 2021-09-25 — End: 2021-09-27
  Administered 2021-09-25: 50 mg via ORAL
  Filled 2021-09-25: qty 1

## 2021-09-25 MED ORDER — ROSUVASTATIN CALCIUM 5 MG PO TABS
10.0000 mg | ORAL_TABLET | Freq: Every day | ORAL | Status: DC
  Administered 2021-09-25 – 2021-09-26 (×2): 10 mg via ORAL
  Filled 2021-09-25 (×2): qty 2

## 2021-09-25 MED ORDER — SODIUM CHLORIDE 0.9% IV SOLUTION: Freq: Once | INTRAVENOUS | Status: AC

## 2021-09-25 MED ORDER — STROKE: EARLY STAGES OF RECOVERY BOOK: Freq: Once | Status: DC

## 2021-09-25 MED ORDER — ONDANSETRON HCL 4 MG/2ML IJ SOLN: 4.0000 mg | Freq: Four times a day (QID) | INTRAMUSCULAR | Status: DC | PRN

## 2021-09-25 MED ORDER — ACETAMINOPHEN 325 MG PO TABS
650.0000 mg | ORAL_TABLET | Freq: Four times a day (QID) | ORAL | Status: DC | PRN
  Administered 2021-09-25: 650 mg via ORAL
  Filled 2021-09-25: qty 2

## 2021-09-25 NOTE — H&P (View-Only) (Signed)
[facility] Consult: 11:12 AM 09/25/2021  LOS: 0 days    Referring Provider: Dr Alvino Chapel in ED Primary Care Physician:  [name], DO Primary Gastroenterologist:  Dr. Carlean Purl as well as [facility] during [facility] admissions    Reason for Consultation: Recurrent anemia, presumed secondary to blood loss.   HPI: Stacey Reyes is a 76 y.o. female.     Longstanding history iron deficiency anemia likely secondary to AVMs.  Extensive GI evaluations beginning August 2020.  Multiple admissions to address anemia, PRBC transfusions and IV iron infusions (last was 02/2021) orchestrated by hematology. EGD August 2020: Duodenal erosions biopsied (benign), otherwise normal exam.  Colonoscopy August 2020: 2 polyps removed (tubular adenomas), diverticulosis in the sigmoid, descending, transverse colon, otherwise normal exam. EGD August 2021: Normal esophagus, small hiatal hernia, innocent appearing duodenal erosions. Small bowel capsule August 2021: Benign duodenal erosions without active bleeding, large amounts of old blood in cecum with no obvious source. Colonoscopy November 2021: 7 polyps removed (tubular adenomas), diverticulosis in sigmoid, descending, transverse, and descending colon, erythema and petechia in association with diverticulosis in the left colon biopsies (benign), external and internal hemorrhoids. Givens capsule November 2021: Duodenitis, 1 small AVM in proximal small bowel about 8 minutes from first duodenal image, 1 tiny AVM at 1 hour 19 minutes, numerous tiny lesions versus luminal contents of unclear significance and distal small bowel. EGD July 2022: normal duodenal bulb, first portion of duodenum and second portion of duodenum.  Capsule study July 2022: duodenitis. No obvious AVMs  appreciated. Complete to the cecum. Possible old blood in colon but difficult to visualize due to debris.  EGD Sept 13, 2022: normal GE junction, multiple gastric polyps, blood in second portion of duodenum suspected secondary to Dieulafoy lesion.  IR 9/13: empiric percutaneous coil embolization of the GDA and a hypertrophied pancreaticoduodenal artery EGD 12/11/20: blood in duodenal bulb and second portion, one bleeding AVM s/p epi but unsuccessful, s/p clips X 3 and hemospray.  IR 12/13/20: coil embolization of inferior pancreaticoduodenal artery from SMA and exoseal for hemostasis.  09/17/2021 EGD.  By Dr. Hurshel Keys.  For recurrent anemia and melena.  2 cm HH.  Normal Z-line, normal stomach.  Blood at duodenal bulb and second and third duodenum.  No active bleeding however.  Dr. Abbey Chatters suspected pt may have had a Dielafoy lesion.  Admitted most recently to [facility] 6/25-6/28/2023.  Hb nadir 7.5 down from 9.9.  Received no PRBC. Had above EGD during the admit.   Ongoing soft, black, tarry stools for at least 3 weeks.  These never abated during last week's hospitalization. No abd pain, headache, vomiting, pyrosis, anorexia.  Compliant with longstanding Protonix bid.  No ASA or NSAIDs. At home she has had increasing dyspnea with short walks within her home.  Hgb 8.5 at time of discharge last week.  6.8 yesterday at office lab..  6.2 today.  Her son brought her to the [facility] ED for evaluation. Blood pressures 90s/50s.  Rate in 70s.  Excellent room air sats in upper 90s  to 100%    Lives in [place] with her husband whose health is frail.  She drinks wine rarely.  Family history of her mother having colon cancer, surgically resected at age 31 but she lived into her 81s and the cancer was not the cause of her death.  She has a sister who had breast cancer.   Past Medical History:  Diagnosis Date   Anemia    Blood transfusion without reported diagnosis    Cataract    removed bilateral    Chronic cystitis    Diverticulitis    Heart murmur    Hyperlipidemia    Hypertension    Personal history of colonic polyps-adenomas 01/07/2012   2009 - 2 diminutive adenomas (prior polyps also) 01/07/2012 - 2 diminutive adenomas      Past Surgical History:  Procedure Laterality Date   BREAST BIOPSY Right    No Scar seen    COLONOSCOPY  multiple   ESOPHAGOGASTRODUODENOSCOPY (EGD) WITH PROPOFOL N/A 11/23/2019   Procedure: ESOPHAGOGASTRODUODENOSCOPY (EGD) WITH PROPOFOL;  Surgeon: Daneil Dolin, MD;  [facility];  Service: Endoscopy;  Laterality: N/A;   ESOPHAGOGASTRODUODENOSCOPY (EGD) WITH PROPOFOL N/A 10/20/2020   Procedure: ESOPHAGOGASTRODUODENOSCOPY (EGD) WITH PROPOFOL;  Surgeon: Eloise Harman, DO;  [facility];  Service: Endoscopy;  Laterality: N/A;   ESOPHAGOGASTRODUODENOSCOPY (EGD) WITH PROPOFOL N/A 12/05/2020   Procedure: ESOPHAGOGASTRODUODENOSCOPY (EGD) WITH PROPOFOL;  Surgeon: Rogene Houston, MD;  [facility];  Service: Endoscopy;  Laterality: N/A;  with enteroscopy   ESOPHAGOGASTRODUODENOSCOPY (EGD) WITH PROPOFOL N/A 12/11/2020   Procedure: ESOPHAGOGASTRODUODENOSCOPY (EGD) WITH PROPOFOL;  Surgeon: Eloise Harman, DO;  [facility];  Service: Endoscopy;  Laterality: N/A;   ESOPHAGOGASTRODUODENOSCOPY (EGD) WITH PROPOFOL N/A 09/17/2021   Procedure: ESOPHAGOGASTRODUODENOSCOPY (EGD) WITH PROPOFOL;  Surgeon: Eloise Harman, DO;  [facility];  Service: Endoscopy;  Laterality: N/A;   GIVENS CAPSULE STUDY N/A 11/23/2019   Procedure: GIVENS CAPSULE STUDY;  Surgeon: Daneil Dolin, MD;  [facility];  Service: Endoscopy;  Laterality: N/A;   GIVENS CAPSULE STUDY N/A 10/22/2020   Procedure: GIVENS CAPSULE STUDY;  Surgeon: Eloise Harman, DO;  [facility];  Service: Endoscopy;  Laterality: N/A;   HEMOSTASIS CLIP PLACEMENT  12/11/2020   Procedure: HEMOSTASIS CLIP PLACEMENT;  Surgeon: Eloise Harman,  DO;  [facility];  Service: Endoscopy;;   IR ANGIOGRAM SELECTIVE EACH ADDITIONAL VESSEL  12/05/2020   IR ANGIOGRAM SELECTIVE EACH ADDITIONAL VESSEL  12/05/2020   IR ANGIOGRAM SELECTIVE EACH ADDITIONAL VESSEL  12/05/2020   IR ANGIOGRAM SELECTIVE EACH ADDITIONAL VESSEL  12/13/2020   IR ANGIOGRAM SELECTIVE EACH ADDITIONAL VESSEL  12/13/2020   IR ANGIOGRAM VISCERAL SELECTIVE  12/05/2020   IR ANGIOGRAM VISCERAL SELECTIVE  12/13/2020   IR EMBO ART  VEN HEMORR LYMPH EXTRAV  INC GUIDE ROADMAPPING  12/05/2020   IR EMBO ART  VEN HEMORR LYMPH EXTRAV  INC GUIDE ROADMAPPING  12/13/2020   IR RADIOLOGIST EVAL & MGMT  01/11/2021   IR US GUIDE VASC ACCESS LEFT  12/13/2020   IR US GUIDE VASC ACCESS RIGHT  12/05/2020    Prior to Admission medications   Medication Sig Start Date End Date Taking? Authorizing Provider  Ascorbic Acid (VITAMIN C) 1000 MG tablet Take 1,000 mg by mouth daily.    [provider]  bisoprolol (ZEBETA) 5 MG tablet TAKE ONE-HALF TABLET BY  MOUTH DAILY Patient taking differently: Take 2.5 mg by mouth daily.  09/07/21   Minus Breeding, MD  Copper Gluconate (COPPER CAPS PO) Take 1 capsule by mouth daily.     [provider]  ergocalciferol (VITAMIN D2) 1.25 MG (50000 UT) capsule Take 50,000 Units by mouth once a week.    [provider]  hydrochlorothiazide (MICROZIDE) 12.5 MG capsule Take 1 capsule (12.5 mg total) by mouth daily. Patient taking differently: Take 12.5 mg by mouth at bedtime. 06/11/21   [name], DO  levocetirizine (XYZAL) 5 MG tablet TAKE 1 TABLET (5 MG TOTAL) BY MOUTH AT BEDTIME AS NEEDED FOR ALLERGIES (DRAINAGE). 09/03/21   [name], DO  Magnesium 200 MG TABS Take 1 tablet (200 mg total) by mouth daily. 03/23/19   Lendon Colonel, NP  Multiple Minerals-Vitamins (CALCIUM-MAGNESIUM-ZINC-D3 PO) Take 1 tablet by mouth daily.    [provider]  pantoprazole (PROTONIX) 40 MG tablet Take 1 tablet (40 mg total) by  mouth 2 (two) times daily. 09/19/21   Orson Eva, MD  rosuvastatin (CRESTOR) 10 MG tablet Take 1 tablet (10 mg total) by mouth at bedtime. 06/11/21   [name], DO  traZODone (DESYREL) 50 MG tablet Take 1 tablet (50 mg total) by mouth at bedtime as needed for sleep. 04/02/21   [name], DO  vitamin E 400 UNIT capsule Take 400 Units by mouth daily.     [provider]    Scheduled Meds:  Infusions:  PRN Meds:    Allergies as of 09/25/2021 - Review Complete 09/25/2021  Allergen Reaction Noted   Sulfa antibiotics Rash 04/29/2019    Family History  Problem Relation Age of Onset   Colon cancer Mother 33       80's   Breast cancer Sister    Asthma Brother    Colon polyps Neg Hx    Esophageal cancer Neg Hx    Rectal cancer Neg Hx    Stomach cancer Neg Hx     Social History   Socioeconomic History   Marital status: Married    Spouse name: Not on file   Number of children: 1   Years of education: Not on file   Highest education level: Not on file  Occupational History   Occupation: retired  Tobacco Use   Smoking status: Former    Types: Cigarettes    Quit date: 12/23/1984    Years since quitting: 36.7   Smokeless tobacco: Never  Vaping Use   Vaping Use: Never used  Substance and Sexual Activity   Alcohol use: Yes    Comment: rare   Drug use: No   Sexual activity: Not on file  Other Topics Concern   Not on file  Social History Narrative   Patient is married and retired and has 1 grown child   Social Determinants of Radio broadcast assistant Strain: Low Risk  (03/21/2021)   Overall Financial Resource Strain (CARDIA)    Difficulty of Paying Living Expenses: Not hard at all  Food Insecurity: No Food Insecurity (03/21/2021)   Hunger Vital Sign    Worried About Running Out of Food in the Last Year: Never true    [place] in the Last Year: Never true  Transportation Needs: No Transportation Needs (03/21/2021)   PRAPARE -  Hydrologist (Medical): No    Lack of Transportation (Non-Medical): No  Physical Activity: Sufficiently Active (03/21/2021)   Exercise Vital Sign    Days of Exercise per Week: 1  day    Minutes of Exercise per Session: 150+ min  Stress: No Stress Concern Present (03/21/2021)   [place]    Feeling of Stress : Only a little  Social Connections: Moderately Integrated (03/21/2021)   Social Connection and Isolation Panel [NHANES]    Frequency of Communication with Friends and Family: More than three times a week    Frequency of Social Gatherings with Friends and Family: More than three times a week    Attends Religious Services: More than 4 times per year    Active Member of [employer] or Organizations: No    Attends Archivist Meetings: Never    Marital Status: Married  Human resources officer Violence: Not At Risk (03/21/2021)   Humiliation, Afraid, Rape, and Kick questionnaire    Fear of Current or Ex-Partner: No    Emotionally Abused: No    Physically Abused: No    Sexually Abused: No    REVIEW OF SYSTEMS: Constitutional: Weakness. ENT:  No nose bleeds Pulm: Dyspnea on exertion CV:  No palpitations, no LE edema.  GU:  No hematuria, no frequency.   GI: See HPI. Heme: Denies other sources for active GI bleeding such as large bruises, ENT bleeding, lacerations. Transfusions: See HPI. Neuro: Along with dyspnea on exertion, when she is up and about she has had pain on the right side of her back from the thoracic sick area down to the hip and more recently had pain down her cervical spine which resolves when she rests, sits down. Derm:  No itching, no rash or sores.  Endocrine:  No sweats or chills.  No polyuria or dysuria Immunization: Not queried Travel: Not queried.   PHYSICAL EXAM: Vital signs in last 24 hours: Vitals:   09/25/21 1045 09/25/21 1100  BP: (!) 99/55 (!) 97/52  Pulse:  65 69  Resp: 15 14  Temp:    SpO2: 97% 97%   Wt Readings from Last 3 Encounters:  09/25/21 93.4 kg  09/17/21 93.4 kg  09/13/21 93.4 kg    General: Pleasant, alert, comfortable.  Good historian.  Pale but otherwise does not look ill.  Overweight. Head: No facial asymmetry or swelling.  No signs of head trauma. Eyes: Conjunctival pallor.  EOMI. Ears: Not hard of hearing Nose: No congestion or discharge Mouth: Oral mucosa is moist, pink, clear.  Tongue midline. Neck: No JVD, no masses, no thyromegaly Lungs: No labored breathing or cough.  Lungs clear bilaterally. Heart: RRR.  Slight systolic murmur.  S1, S2 present. Abdomen: Soft.  Obese.  Active bowel sounds.  No HSM, masses, bruits, hernias.   Rectal: Greenish-black, pasty stool is strongly FOBT positive.  No visible or palpable masses, lesions Musc/Skeltl: No joint redness or gross deformity Extremities: No CCE.  Feet are warm. Neurologic: Oriented x3.  Moves all 4 limbs.  No gross deficits, formal strength testing not performed. Skin: No telangiectasia, sores, rashes or suspicious lesions Nodes: No cervical adenopathy Psych: Calm, pleasant, fluid speech.  Intake/Output from previous day: No intake/output data recorded. Intake/Output this shift: No intake/output data recorded.  LAB RESULTS: Recent Labs    09/24/21 0839 09/25/21 0918  WBC 5.3 5.4  HGB 6.8* 6.2*  HCT 21.8* 20.3*  PLT 228 205   BMET Lab Results  Component Value Date   NA 140 09/25/2021   NA 138 09/19/2021   NA 138 09/17/2021   K 3.8 09/25/2021   K 3.9 09/19/2021   K 3.6 09/17/2021  CL 103 09/25/2021   CL 107 09/19/2021   CL 107 09/17/2021   CO2 24 09/25/2021   CO2 27 09/19/2021   CO2 25 09/17/2021   GLUCOSE 110 (H) 09/25/2021   GLUCOSE 110 (H) 09/19/2021   GLUCOSE 106 (H) 09/17/2021   BUN 18 09/25/2021   BUN 12 09/19/2021   BUN 17 09/17/2021   CREATININE 0.80 09/25/2021   CREATININE 0.69 09/19/2021   CREATININE 0.75 09/17/2021    CALCIUM 9.2 09/25/2021   CALCIUM 8.6 (L) 09/19/2021   CALCIUM 8.0 (L) 09/17/2021   LFT Recent Labs    09/25/21 0918  PROT 5.5*  ALBUMIN 3.4*  AST 29  ALT 14  ALKPHOS 20*  BILITOT 0.6   PT/INR Lab Results  Component Value Date   INR 1.1 09/25/2021   INR 1.1 12/10/2020   INR 1.1 10/20/2020   Hepatitis Panel No results for input(s): "HEPBSAG", "HCVAB", "HEPAIGM", "HEPBIGM" in the last 72 hours. C-Diff No components found for: "CDIFF" Lipase     Component Value Date/Time   LIPASE 35 10/19/2020 1919    Drugs of Abuse  No results found for: "LABOPIA", "COCAINSCRNUR", "LABBENZ", "AMPHETMU", "THCU", "LABBARB"   RADIOLOGY STUDIES: No results found.    IMPRESSION:   Ongoing melenic, FOBT positive stools.  Extensive prior work-ups for GI bleed and blood loss anemia presumed secondary to AVMs.  Just underwent EGD for melena/anemia 8 days ago.  Dr. Abbey Chatters saw blood in the examined duodenum but no active source for the bleeding, he surmised that the patient may have had Dieulafoy lesion.  PLAN:       SBE and or VCE?  Will d/w Dr Carlean Purl.  Patient agreeable to proceed with what ever is planned.  Last meal was coffee with cream around 6:30 AM this morning.  Agree w Protonix 40 iv (bid initiated).  2 PRBCs as ordered.  Depending on timing of endoscopy, would keep n.p.o. for now.  If plan is to pursue this tomorrow, could have clears and n.p.o. after midnight   Azucena Freed  09/25/2021, 11:12 AM Phone [phone]

## 2021-09-25 NOTE — ED Notes (Signed)
Electronic blood consent signed by patient and this RN.

## 2021-09-25 NOTE — Progress Notes (Signed)
Patient had an episode of aphasia during conversation.  Charge RN made aware, Rapid response nurse called and stroke response nurse as well.  Neurologist to bedside. During episode patient was not able to form words and thus had a word salad of jumbled up words and phrases. Speech was unintelligible.  Once aphasia was over patient was able to tell me that she did not know why she was having difficulty forming words. Patient was able to answer all orientation questions correctly upon this RN assessment.  Aurther Loft, RN

## 2021-09-25 NOTE — Progress Notes (Signed)
Pt transferred  To 3W13 [place] given report VSS

## 2021-09-25 NOTE — Consult Note (Signed)
[facility] Consult: 11:12 AM 09/25/2021  LOS: 0 days    Referring Provider: Dr Alvino Chapel in ED Primary Care Physician:  [name], DO Primary Gastroenterologist:  Dr. Carlean Purl as well as [facility] during [facility] admissions    Reason for Consultation: Recurrent anemia, presumed secondary to blood loss.   HPI: Stacey Reyes is a 76 y.o. female.     Longstanding history iron deficiency anemia likely secondary to AVMs.  Extensive GI evaluations beginning August 2020.  Multiple admissions to address anemia, PRBC transfusions and IV iron infusions (last was 02/2021) orchestrated by hematology. EGD August 2020: Duodenal erosions biopsied (benign), otherwise normal exam.  Colonoscopy August 2020: 2 polyps removed (tubular adenomas), diverticulosis in the sigmoid, descending, transverse colon, otherwise normal exam. EGD August 2021: Normal esophagus, small hiatal hernia, innocent appearing duodenal erosions. Small bowel capsule August 2021: Benign duodenal erosions without active bleeding, large amounts of old blood in cecum with no obvious source. Colonoscopy November 2021: 7 polyps removed (tubular adenomas), diverticulosis in sigmoid, descending, transverse, and descending colon, erythema and petechia in association with diverticulosis in the left colon biopsies (benign), external and internal hemorrhoids. Givens capsule November 2021: Duodenitis, 1 small AVM in proximal small bowel about 8 minutes from first duodenal image, 1 tiny AVM at 1 hour 19 minutes, numerous tiny lesions versus luminal contents of unclear significance and distal small bowel. EGD July 2022: normal duodenal bulb, first portion of duodenum and second portion of duodenum.  Capsule study July 2022: duodenitis. No obvious AVMs  appreciated. Complete to the cecum. Possible old blood in colon but difficult to visualize due to debris.  EGD Sept 13, 2022: normal GE junction, multiple gastric polyps, blood in second portion of duodenum suspected secondary to Dieulafoy lesion.  IR 9/13: empiric percutaneous coil embolization of the GDA and a hypertrophied pancreaticoduodenal artery EGD 12/11/20: blood in duodenal bulb and second portion, one bleeding AVM s/p epi but unsuccessful, s/p clips X 3 and hemospray.  IR 12/13/20: coil embolization of inferior pancreaticoduodenal artery from SMA and exoseal for hemostasis.  09/17/2021 EGD.  By Dr. Hurshel Keys.  For recurrent anemia and melena.  2 cm HH.  Normal Z-line, normal stomach.  Blood at duodenal bulb and second and third duodenum.  No active bleeding however.  Dr. Abbey Chatters suspected pt may have had a Dielafoy lesion.  Admitted most recently to [facility] 6/25-6/28/2023.  Hb nadir 7.5 down from 9.9.  Received no PRBC. Had above EGD during the admit.   Ongoing soft, black, tarry stools for at least 3 weeks.  These never abated during last week's hospitalization. No abd pain, headache, vomiting, pyrosis, anorexia.  Compliant with longstanding Protonix bid.  No ASA or NSAIDs. At home she has had increasing dyspnea with short walks within her home.  Hgb 8.5 at time of discharge last week.  6.8 yesterday at office lab..  6.2 today.  Her son brought her to the [facility] ED for evaluation. Blood pressures 90s/50s.  Rate in 70s.  Excellent room air sats in upper 90s  to 100%    Lives in [place] with her husband whose health is frail.  She drinks wine rarely.  Family history of her mother having colon cancer, surgically resected at age 26 but she lived into her 108s and the cancer was not the cause of her death.  She has a sister who had breast cancer.   Past Medical History:  Diagnosis Date   Anemia    Blood transfusion without reported diagnosis    Cataract    removed bilateral    Chronic cystitis    Diverticulitis    Heart murmur    Hyperlipidemia    Hypertension    Personal history of colonic polyps-adenomas 01/07/2012   2009 - 2 diminutive adenomas (prior polyps also) 01/07/2012 - 2 diminutive adenomas      Past Surgical History:  Procedure Laterality Date   BREAST BIOPSY Right    No Scar seen    COLONOSCOPY  multiple   ESOPHAGOGASTRODUODENOSCOPY (EGD) WITH PROPOFOL N/A 11/23/2019   Procedure: ESOPHAGOGASTRODUODENOSCOPY (EGD) WITH PROPOFOL;  Surgeon: Daneil Dolin, MD;  [facility];  Service: Endoscopy;  Laterality: N/A;   ESOPHAGOGASTRODUODENOSCOPY (EGD) WITH PROPOFOL N/A 10/20/2020   Procedure: ESOPHAGOGASTRODUODENOSCOPY (EGD) WITH PROPOFOL;  Surgeon: Eloise Harman, DO;  [facility];  Service: Endoscopy;  Laterality: N/A;   ESOPHAGOGASTRODUODENOSCOPY (EGD) WITH PROPOFOL N/A 12/05/2020   Procedure: ESOPHAGOGASTRODUODENOSCOPY (EGD) WITH PROPOFOL;  Surgeon: Rogene Houston, MD;  [facility];  Service: Endoscopy;  Laterality: N/A;  with enteroscopy   ESOPHAGOGASTRODUODENOSCOPY (EGD) WITH PROPOFOL N/A 12/11/2020   Procedure: ESOPHAGOGASTRODUODENOSCOPY (EGD) WITH PROPOFOL;  Surgeon: Eloise Harman, DO;  [facility];  Service: Endoscopy;  Laterality: N/A;   ESOPHAGOGASTRODUODENOSCOPY (EGD) WITH PROPOFOL N/A 09/17/2021   Procedure: ESOPHAGOGASTRODUODENOSCOPY (EGD) WITH PROPOFOL;  Surgeon: Eloise Harman, DO;  [facility];  Service: Endoscopy;  Laterality: N/A;   GIVENS CAPSULE STUDY N/A 11/23/2019   Procedure: GIVENS CAPSULE STUDY;  Surgeon: Daneil Dolin, MD;  [facility];  Service: Endoscopy;  Laterality: N/A;   GIVENS CAPSULE STUDY N/A 10/22/2020   Procedure: GIVENS CAPSULE STUDY;  Surgeon: Eloise Harman, DO;  [facility];  Service: Endoscopy;  Laterality: N/A;   HEMOSTASIS CLIP PLACEMENT  12/11/2020   Procedure: HEMOSTASIS CLIP PLACEMENT;  Surgeon: Eloise Harman,  DO;  [facility];  Service: Endoscopy;;   IR ANGIOGRAM SELECTIVE EACH ADDITIONAL VESSEL  12/05/2020   IR ANGIOGRAM SELECTIVE EACH ADDITIONAL VESSEL  12/05/2020   IR ANGIOGRAM SELECTIVE EACH ADDITIONAL VESSEL  12/05/2020   IR ANGIOGRAM SELECTIVE EACH ADDITIONAL VESSEL  12/13/2020   IR ANGIOGRAM SELECTIVE EACH ADDITIONAL VESSEL  12/13/2020   IR ANGIOGRAM VISCERAL SELECTIVE  12/05/2020   IR ANGIOGRAM VISCERAL SELECTIVE  12/13/2020   IR EMBO ART  VEN HEMORR LYMPH EXTRAV  INC GUIDE ROADMAPPING  12/05/2020   IR EMBO ART  VEN HEMORR LYMPH EXTRAV  INC GUIDE ROADMAPPING  12/13/2020   IR RADIOLOGIST EVAL & MGMT  01/11/2021   IR US GUIDE VASC ACCESS LEFT  12/13/2020   IR US GUIDE VASC ACCESS RIGHT  12/05/2020    Prior to Admission medications   Medication Sig Start Date End Date Taking? Authorizing Provider  Ascorbic Acid (VITAMIN C) 1000 MG tablet Take 1,000 mg by mouth daily.    [provider]  bisoprolol (ZEBETA) 5 MG tablet TAKE ONE-HALF TABLET BY  MOUTH DAILY Patient taking differently: Take 2.5 mg by mouth daily.  09/07/21   Minus Breeding, MD  Copper Gluconate (COPPER CAPS PO) Take 1 capsule by mouth daily.     [provider]  ergocalciferol (VITAMIN D2) 1.25 MG (50000 UT) capsule Take 50,000 Units by mouth once a week.    [provider]  hydrochlorothiazide (MICROZIDE) 12.5 MG capsule Take 1 capsule (12.5 mg total) by mouth daily. Patient taking differently: Take 12.5 mg by mouth at bedtime. 06/11/21   [name], DO  levocetirizine (XYZAL) 5 MG tablet TAKE 1 TABLET (5 MG TOTAL) BY MOUTH AT BEDTIME AS NEEDED FOR ALLERGIES (DRAINAGE). 09/03/21   [name], DO  Magnesium 200 MG TABS Take 1 tablet (200 mg total) by mouth daily. 03/23/19   Lendon Colonel, NP  Multiple Minerals-Vitamins (CALCIUM-MAGNESIUM-ZINC-D3 PO) Take 1 tablet by mouth daily.    [provider]  pantoprazole (PROTONIX) 40 MG tablet Take 1 tablet (40 mg total) by  mouth 2 (two) times daily. 09/19/21   Orson Eva, MD  rosuvastatin (CRESTOR) 10 MG tablet Take 1 tablet (10 mg total) by mouth at bedtime. 06/11/21   [name], DO  traZODone (DESYREL) 50 MG tablet Take 1 tablet (50 mg total) by mouth at bedtime as needed for sleep. 04/02/21   [name], DO  vitamin E 400 UNIT capsule Take 400 Units by mouth daily.     [provider]    Scheduled Meds:  Infusions:  PRN Meds:    Allergies as of 09/25/2021 - Review Complete 09/25/2021  Allergen Reaction Noted   Sulfa antibiotics Rash 04/29/2019    Family History  Problem Relation Age of Onset   Colon cancer Mother 72       80's   Breast cancer Sister    Asthma Brother    Colon polyps Neg Hx    Esophageal cancer Neg Hx    Rectal cancer Neg Hx    Stomach cancer Neg Hx     Social History   Socioeconomic History   Marital status: Married    Spouse name: Not on file   Number of children: 1   Years of education: Not on file   Highest education level: Not on file  Occupational History   Occupation: retired  Tobacco Use   Smoking status: Former    Types: Cigarettes    Quit date: 12/23/1984    Years since quitting: 36.7   Smokeless tobacco: Never  Vaping Use   Vaping Use: Never used  Substance and Sexual Activity   Alcohol use: Yes    Comment: rare   Drug use: No   Sexual activity: Not on file  Other Topics Concern   Not on file  Social History Narrative   Patient is married and retired and has 1 grown child   Social Determinants of Radio broadcast assistant Strain: Low Risk  (03/21/2021)   Overall Financial Resource Strain (CARDIA)    Difficulty of Paying Living Expenses: Not hard at all  Food Insecurity: No Food Insecurity (03/21/2021)   Hunger Vital Sign    Worried About Running Out of Food in the Last Year: Never true    [place] in the Last Year: Never true  Transportation Needs: No Transportation Needs (03/21/2021)   PRAPARE -  Hydrologist (Medical): No    Lack of Transportation (Non-Medical): No  Physical Activity: Sufficiently Active (03/21/2021)   Exercise Vital Sign    Days of Exercise per Week: 1  day    Minutes of Exercise per Session: 150+ min  Stress: No Stress Concern Present (03/21/2021)   [place]    Feeling of Stress : Only a little  Social Connections: Moderately Integrated (03/21/2021)   Social Connection and Isolation Panel [NHANES]    Frequency of Communication with Friends and Family: More than three times a week    Frequency of Social Gatherings with Friends and Family: More than three times a week    Attends Religious Services: More than 4 times per year    Active Member of [employer] or Organizations: No    Attends Archivist Meetings: Never    Marital Status: Married  Human resources officer Violence: Not At Risk (03/21/2021)   Humiliation, Afraid, Rape, and Kick questionnaire    Fear of Current or Ex-Partner: No    Emotionally Abused: No    Physically Abused: No    Sexually Abused: No    REVIEW OF SYSTEMS: Constitutional: Weakness. ENT:  No nose bleeds Pulm: Dyspnea on exertion CV:  No palpitations, no LE edema.  GU:  No hematuria, no frequency.   GI: See HPI. Heme: Denies other sources for active GI bleeding such as large bruises, ENT bleeding, lacerations. Transfusions: See HPI. Neuro: Along with dyspnea on exertion, when she is up and about she has had pain on the right side of her back from the thoracic sick area down to the hip and more recently had pain down her cervical spine which resolves when she rests, sits down. Derm:  No itching, no rash or sores.  Endocrine:  No sweats or chills.  No polyuria or dysuria Immunization: Not queried Travel: Not queried.   PHYSICAL EXAM: Vital signs in last 24 hours: Vitals:   09/25/21 1045 09/25/21 1100  BP: (!) 99/55 (!) 97/52  Pulse:  65 69  Resp: 15 14  Temp:    SpO2: 97% 97%   Wt Readings from Last 3 Encounters:  09/25/21 93.4 kg  09/17/21 93.4 kg  09/13/21 93.4 kg    General: Pleasant, alert, comfortable.  Good historian.  Pale but otherwise does not look ill.  Overweight. Head: No facial asymmetry or swelling.  No signs of head trauma. Eyes: Conjunctival pallor.  EOMI. Ears: Not hard of hearing Nose: No congestion or discharge Mouth: Oral mucosa is moist, pink, clear.  Tongue midline. Neck: No JVD, no masses, no thyromegaly Lungs: No labored breathing or cough.  Lungs clear bilaterally. Heart: RRR.  Slight systolic murmur.  S1, S2 present. Abdomen: Soft.  Obese.  Active bowel sounds.  No HSM, masses, bruits, hernias.   Rectal: Greenish-black, pasty stool is strongly FOBT positive.  No visible or palpable masses, lesions Musc/Skeltl: No joint redness or gross deformity Extremities: No CCE.  Feet are warm. Neurologic: Oriented x3.  Moves all 4 limbs.  No gross deficits, formal strength testing not performed. Skin: No telangiectasia, sores, rashes or suspicious lesions Nodes: No cervical adenopathy Psych: Calm, pleasant, fluid speech.  Intake/Output from previous day: No intake/output data recorded. Intake/Output this shift: No intake/output data recorded.  LAB RESULTS: Recent Labs    09/24/21 0839 09/25/21 0918  WBC 5.3 5.4  HGB 6.8* 6.2*  HCT 21.8* 20.3*  PLT 228 205   BMET Lab Results  Component Value Date   NA 140 09/25/2021   NA 138 09/19/2021   NA 138 09/17/2021   K 3.8 09/25/2021   K 3.9 09/19/2021   K 3.6 09/17/2021  CL 103 09/25/2021   CL 107 09/19/2021   CL 107 09/17/2021   CO2 24 09/25/2021   CO2 27 09/19/2021   CO2 25 09/17/2021   GLUCOSE 110 (H) 09/25/2021   GLUCOSE 110 (H) 09/19/2021   GLUCOSE 106 (H) 09/17/2021   BUN 18 09/25/2021   BUN 12 09/19/2021   BUN 17 09/17/2021   CREATININE 0.80 09/25/2021   CREATININE 0.69 09/19/2021   CREATININE 0.75 09/17/2021    CALCIUM 9.2 09/25/2021   CALCIUM 8.6 (L) 09/19/2021   CALCIUM 8.0 (L) 09/17/2021   LFT Recent Labs    09/25/21 0918  PROT 5.5*  ALBUMIN 3.4*  AST 29  ALT 14  ALKPHOS 20*  BILITOT 0.6   PT/INR Lab Results  Component Value Date   INR 1.1 09/25/2021   INR 1.1 12/10/2020   INR 1.1 10/20/2020   Hepatitis Panel No results for input(s): "HEPBSAG", "HCVAB", "HEPAIGM", "HEPBIGM" in the last 72 hours. C-Diff No components found for: "CDIFF" Lipase     Component Value Date/Time   LIPASE 35 10/19/2020 1919    Drugs of Abuse  No results found for: "LABOPIA", "COCAINSCRNUR", "LABBENZ", "AMPHETMU", "THCU", "LABBARB"   RADIOLOGY STUDIES: No results found.    IMPRESSION:   Ongoing melenic, FOBT positive stools.  Extensive prior work-ups for GI bleed and blood loss anemia presumed secondary to AVMs.  Just underwent EGD for melena/anemia 8 days ago.  Dr. Abbey Chatters saw blood in the examined duodenum but no active source for the bleeding, he surmised that the patient may have had Dieulafoy lesion.  PLAN:       SBE and or VCE?  Will d/w Dr Carlean Purl.  Patient agreeable to proceed with what ever is planned.  Last meal was coffee with cream around 6:30 AM this morning.  Agree w Protonix 40 iv (bid initiated).  2 PRBCs as ordered.  Depending on timing of endoscopy, would keep n.p.o. for now.  If plan is to pursue this tomorrow, could have clears and n.p.o. after midnight   Azucena Freed  09/25/2021, 11:12 AM Phone [phone]

## 2021-09-25 NOTE — H&P (Signed)
History and Physical    Patient: Stacey Reyes CXK:[id number] DOB: 10-02-1945 DOA: 09/25/2021 DOS: the patient was seen and examined on 09/25/2021 PCP: [name], DO  Patient coming from: Home  Chief Complaint:  Chief Complaint  Patient presents with   GI Bleeding   HPI: Stacey Reyes is a 76 y.o. female with medical history significant of hypertension, hyperlipidemia, anemia, colon polyps, and GI bleed presents with complaints of black stools.  She had just recently been hospitalized 6/25-6/28 for upper GI bleed at [facility] where EGD revealed blood present in the duodenum in the bulb, second, and third portion of the duodenum without presence of active bleeding source.  Thought to be a Dieulafoy lesion leading to her presentation.  Patient was recommended to continue on Protonix and her diet was advanced as tolerated.  Since getting home patient reports that she has continued to have about 1 formed bowel movement every day that is black and tarry in appearance.  She has been following with her primary care provider but noted her hemoglobin tag continue to trend down 7.7-> 6.8 yesterday.  She reported associated symptoms of shortness of breath with activity, increasing fatigue, lightheadedness with getting up this morning, and generalized myalgia.  She denies having any loss of consciousness, fever, abdominal pain, chest pain, nausea, vomiting, diarrhea, or dysuria symptoms.    Prior to all of this she had been doing well after being admitted to the hospital in September 2022.  During that hospitalization she underwent empiric percutaneous coil embolization of the GDA and a hypertrophied pancreaticoduodenal artery with IR on 12/05/2020.  The EGD on 12/11/2020 noted blood in duodenal bulb and second portion, one bleeding AVM s/p epi, but unsuccessful, s/p clips X 3 and hemospray.  Patient underwent coil embolization of inferior pancreaticoduodenal artery from SMA and exoseal for  hemostasis with IR on 12/13/2020.   On admission to the emergency department patient was noted to be afebrile with relatively stable vital signs.  Labs noted hemoglobin down to 6.2 g/dL.  Patient has been typed and screened for possible need of blood products.  [facility] was formally consulted.  TRH called to admit.  Review of Systems: As mentioned in the history of present illness. All other systems reviewed and are negative. Past Medical History:  Diagnosis Date   Anemia    Blood transfusion without reported diagnosis    Cataract    removed bilateral   Chronic cystitis    Diverticulitis    Heart murmur    Hyperlipidemia    Hypertension    Personal history of colonic polyps-adenomas 01/07/2012   2009 - 2 diminutive adenomas (prior polyps also) 01/07/2012 - 2 diminutive adenomas     Past Surgical History:  Procedure Laterality Date   BREAST BIOPSY Right    No Scar seen    COLONOSCOPY  multiple   ESOPHAGOGASTRODUODENOSCOPY (EGD) WITH PROPOFOL N/A 11/23/2019   Procedure: ESOPHAGOGASTRODUODENOSCOPY (EGD) WITH PROPOFOL;  Surgeon: Stacey Dolin, MD;  [facility];  Service: Endoscopy;  Laterality: N/A;   ESOPHAGOGASTRODUODENOSCOPY (EGD) WITH PROPOFOL N/A 10/20/2020   Procedure: ESOPHAGOGASTRODUODENOSCOPY (EGD) WITH PROPOFOL;  Surgeon: Stacey Harman, DO;  [facility];  Service: Endoscopy;  Laterality: N/A;   ESOPHAGOGASTRODUODENOSCOPY (EGD) WITH PROPOFOL N/A 12/05/2020   Procedure: ESOPHAGOGASTRODUODENOSCOPY (EGD) WITH PROPOFOL;  Surgeon: Stacey Houston, MD;  [facility];  Service: Endoscopy;  Laterality: N/A;  with enteroscopy   ESOPHAGOGASTRODUODENOSCOPY (EGD) WITH PROPOFOL N/A 12/11/2020  Procedure: ESOPHAGOGASTRODUODENOSCOPY (EGD) WITH PROPOFOL;  Surgeon: Stacey Harman, DO;  [facility];  Service: Endoscopy;  Laterality: N/A;   ESOPHAGOGASTRODUODENOSCOPY (EGD) WITH PROPOFOL N/A 09/17/2021   Procedure: ESOPHAGOGASTRODUODENOSCOPY  (EGD) WITH PROPOFOL;  Surgeon: Stacey Harman, DO;  [facility];  Service: Endoscopy;  Laterality: N/A;   GIVENS CAPSULE STUDY N/A 11/23/2019   Procedure: GIVENS CAPSULE STUDY;  Surgeon: Stacey Dolin, MD;  [facility];  Service: Endoscopy;  Laterality: N/A;   GIVENS CAPSULE STUDY N/A 10/22/2020   Procedure: GIVENS CAPSULE STUDY;  Surgeon: Stacey Harman, DO;  [facility];  Service: Endoscopy;  Laterality: N/A;   HEMOSTASIS CLIP PLACEMENT  12/11/2020   Procedure: HEMOSTASIS CLIP PLACEMENT;  Surgeon: Stacey Harman, DO;  [facility];  Service: Endoscopy;;   IR ANGIOGRAM SELECTIVE EACH ADDITIONAL VESSEL  12/05/2020   IR ANGIOGRAM SELECTIVE EACH ADDITIONAL VESSEL  12/05/2020   IR ANGIOGRAM SELECTIVE EACH ADDITIONAL VESSEL  12/05/2020   IR ANGIOGRAM SELECTIVE EACH ADDITIONAL VESSEL  12/13/2020   IR ANGIOGRAM SELECTIVE EACH ADDITIONAL VESSEL  12/13/2020   IR ANGIOGRAM VISCERAL SELECTIVE  12/05/2020   IR ANGIOGRAM VISCERAL SELECTIVE  12/13/2020   IR EMBO ART  VEN HEMORR LYMPH EXTRAV  INC GUIDE ROADMAPPING  12/05/2020   IR EMBO ART  VEN HEMORR LYMPH EXTRAV  INC GUIDE ROADMAPPING  12/13/2020   IR RADIOLOGIST EVAL & MGMT  01/11/2021   IR US GUIDE VASC ACCESS LEFT  12/13/2020   IR US GUIDE VASC ACCESS RIGHT  12/05/2020   Social History:  reports that she quit smoking about 36 years ago. Her smoking use included cigarettes. She has never used smokeless tobacco. She reports current alcohol use. She reports that she does not use drugs.  Allergies  Allergen Reactions   Sulfa Antibiotics Rash    Family History  Problem Relation Age of Onset   Colon cancer Mother 108       80's   Breast cancer Sister    Asthma Brother    Colon polyps Neg Hx    Esophageal cancer Neg Hx    Rectal cancer Neg Hx    Stomach cancer Neg Hx     Prior to Admission medications   Medication Sig Start Date End Date Taking? Authorizing Provider  Ascorbic Acid (VITAMIN C) 1000  MG tablet Take 1,000 mg by mouth daily.    [provider]  bisoprolol (ZEBETA) 5 MG tablet TAKE ONE-HALF TABLET BY  MOUTH DAILY Patient taking differently: Take 2.5 mg by mouth daily. 09/07/21   Minus Breeding, MD  Copper Gluconate (COPPER CAPS PO) Take 1 capsule by mouth daily.     [provider]  ergocalciferol (VITAMIN D2) 1.25 MG (50000 UT) capsule Take 50,000 Units by mouth once a week.    [provider]  hydrochlorothiazide (MICROZIDE) 12.5 MG capsule Take 1 capsule (12.5 mg total) by mouth daily. Patient taking differently: Take 12.5 mg by mouth at bedtime. 06/11/21   [name], DO  levocetirizine (XYZAL) 5 MG tablet TAKE 1 TABLET (5 MG TOTAL) BY MOUTH AT BEDTIME AS NEEDED FOR ALLERGIES (DRAINAGE). 09/03/21   [name], DO  Magnesium 200 MG TABS Take 1 tablet (200 mg total) by mouth daily. 03/23/19   Lendon Colonel, NP  Multiple Minerals-Vitamins (CALCIUM-MAGNESIUM-ZINC-D3 PO) Take 1 tablet by mouth daily.    [provider]  pantoprazole (PROTONIX) 40 MG tablet Take 1 tablet (40 mg total) by  mouth 2 (two) times daily. 09/19/21   Orson Eva, MD  rosuvastatin (CRESTOR) 10 MG tablet Take 1 tablet (10 mg total) by mouth at bedtime. 06/11/21   [name], DO  traZODone (DESYREL) 50 MG tablet Take 1 tablet (50 mg total) by mouth at bedtime as needed for sleep. 04/02/21   [name], DO  vitamin E 400 UNIT capsule Take 400 Units by mouth daily.     [provider]    Physical Exam: Vitals:   09/25/21 0906 09/25/21 0908 09/25/21 1030  BP: 103/63  (!) 108/53  Pulse: 75  67  Resp: (!) 22  16  Temp: 98.5 F (36.9 C)    TempSrc: Oral    SpO2: 100%  99%  Weight:  93.4 kg   Height:  '5\' 6"'$  (1.676 m)    Constitutional: Elderly female currently in no acute distress Eyes: PERRL, lids and conjunctivae normal ENMT: Mucous membranes are moist.   Neck: normal, supple,  Respiratory: clear to auscultation  bilaterally, no wheezing, no crackles. Normal respiratory effort. No accessory muscle use.  Cardiovascular: Regular rate and rhythm, no murmurs / rubs / gallops.  Trace lower extremity  edema. 2+ pedal pulses.  Abdomen: no tenderness, no masses palpated.  Bowel sounds positive.  Musculoskeletal: no clubbing / cyanosis. No joint deformity upper and lower extremities. Good ROM, no contractures. Normal muscle tone.  Skin: no rashes, lesions, ulcers.   Neurologic: CN 2-12 grossly intact.  Strength 5/5 in all 4.  Psychiatric: Normal judgment and insight. Alert and oriented x 3. Normal mood.   Data Reviewed:  Reviewed labs and pertinent records from recent hospitalization as noted above  Assessment and Plan: Acute blood loss anemia secondary to upper GI bleed  Patient presents with hemoglobin down to 6.2 g/dL, but had been 8.5 on 6/28.  During the hospitalization patient underwent EGD last on 6/26 which noted blood in the duodenum without active bleeding source thought to be secondary to a Dieulafoy lesion.  Prior to that had embolization with IR in 11/2020. -Admit to a telemetry bed -Follow-up stool guaiac -N.p.o. for possible procedure -Transfuse 2 units of packed red blood cells -Serial H&H -Protonix 40 mg IV twice daily -[facility] consulted, will follow-up for any further recommendations  Essential hypertension Blood pressures initially soft as 95/53.  Home blood pressure regimen includes bisoprolol 2.5 mg daily and hydrochlorothiazide 12.5 mg nightly. -Hold home blood pressure regimen.  Continue to monitor and determine when medically appropriate to resume  Hyperlipidemia -Continue Crestor  History of GI bleed Patient with prior history of GI bleed requiring multiple endoscopic procedures previously in the past.  DVT prophylaxis: SCDs Advance Care Planning:   Code Status: Full Code   Consults: [facility]  Family Communication: None requested  Severity of Illness: The  appropriate patient status for this patient is OBSERVATION. Observation status is judged to be reasonable and necessary in order to provide the required intensity of service to ensure the patient's safety. The patient's presenting symptoms, physical exam findings, and initial radiographic and laboratory data in the context of their medical condition is felt to place them at decreased risk for further clinical deterioration. Furthermore, it is anticipated that the patient will be medically stable for discharge from the hospital within 2 midnights of admission.   Author: Norval Morton, MD 09/25/2021 11:04 AM  For on call review [email or web address].

## 2021-09-25 NOTE — ED Provider Notes (Signed)
[facility] EMERGENCY DEPARTMENT Provider Note   CSN: [id number] Arrival date & time: 09/25/21  0857     History  Chief Complaint  Patient presents with   GI Bleeding    Stacey Reyes is a 76 y.o. female.  HPI Patient presents with recurrent GI bleed/anemia.  Has had multiple admissions to the hospital for GI bleeding.  Recent admission [facility] and had endoscopy that showed dieulafoy lesion.  Had blood transfusion.  Hemoglobin had gone down in the sevens.  Follow-up with PCP and found to have hemoglobin is down to 6.  6.8 yesterday and 6.2 today.  Feeling more short of breath.  States she had not been able to walk to the end of the room.  Did have some slight upper back pain also.  No fevers or chills.  No coughing.  States her stools continue to be black.  No red blood seen in the stool. Reviewed previous notes and GI notes and has had previous endoscopies and interventional radiology treatments.     Past Medical History:  Diagnosis Date   Anemia    Blood transfusion without reported diagnosis    Cataract    removed bilateral   Chronic cystitis    Diverticulitis    Heart murmur    Hyperlipidemia    Hypertension    Personal history of colonic polyps-adenomas 01/07/2012   2009 - 2 diminutive adenomas (prior polyps also) 01/07/2012 - 2 diminutive adenomas      Home Medications Prior to Admission medications   Medication Sig Start Date End Date Taking? Authorizing Provider  Ascorbic Acid (VITAMIN C) 1000 MG tablet Take 1,000 mg by mouth daily.    [provider]  bisoprolol (ZEBETA) 5 MG tablet TAKE ONE-HALF TABLET BY  MOUTH DAILY Patient taking differently: Take 2.5 mg by mouth daily. 09/07/21   Minus Breeding, MD  Copper Gluconate (COPPER CAPS PO) Take 1 capsule by mouth daily.     [provider]  ergocalciferol (VITAMIN D2) 1.25 MG (50000 UT) capsule Take 50,000 Units by mouth once a week.    [provider]   hydrochlorothiazide (MICROZIDE) 12.5 MG capsule Take 1 capsule (12.5 mg total) by mouth daily. Patient taking differently: Take 12.5 mg by mouth at bedtime. 06/11/21   [name], DO  levocetirizine (XYZAL) 5 MG tablet TAKE 1 TABLET (5 MG TOTAL) BY MOUTH AT BEDTIME AS NEEDED FOR ALLERGIES (DRAINAGE). 09/03/21   [name], DO  Magnesium 200 MG TABS Take 1 tablet (200 mg total) by mouth daily. 03/23/19   Lendon Colonel, NP  Multiple Minerals-Vitamins (CALCIUM-MAGNESIUM-ZINC-D3 PO) Take 1 tablet by mouth daily.    [provider]  pantoprazole (PROTONIX) 40 MG tablet Take 1 tablet (40 mg total) by mouth 2 (two) times daily. 09/19/21   Orson Eva, MD  rosuvastatin (CRESTOR) 10 MG tablet Take 1 tablet (10 mg total) by mouth at bedtime. 06/11/21   [name], DO  traZODone (DESYREL) 50 MG tablet Take 1 tablet (50 mg total) by mouth at bedtime as needed for sleep. 04/02/21   [name], DO  vitamin E 400 UNIT capsule Take 400 Units by mouth daily.     [provider]      Allergies    Sulfa antibiotics    Review of Systems   Review of Systems  Physical Exam Updated Vital Signs BP (!) 108/53   Pulse 67   Temp 98.5 F (36.9 C) (Oral)  Resp 16   Ht '5\' 6"'$  (1.676 m)   Wt 93.4 kg   SpO2 99%   BMI 33.23 kg/m  Physical Exam Vitals and nursing note reviewed.  HENT:     Head: Atraumatic.  Cardiovascular:     Rate and Rhythm: Regular rhythm.  Pulmonary:     Breath sounds: No wheezing.  Abdominal:     Tenderness: There is no abdominal tenderness.  Skin:    Capillary Refill: Capillary refill takes less than 2 seconds.     Coloration: Skin is pale.  Neurological:     Mental Status: She is alert and oriented to person, place, and time.     ED Results / Procedures / Treatments   Labs (all labs ordered are listed, but only abnormal results are displayed) Labs Reviewed  COMPREHENSIVE METABOLIC PANEL - Abnormal; Notable for the  following components:      Result Value   Glucose, Bld 110 (*)    Total Protein 5.5 (*)    Albumin 3.4 (*)    Alkaline Phosphatase 20 (*)    All other components within normal limits  CBC - Abnormal; Notable for the following components:   RBC 2.14 (*)    Hemoglobin 6.2 (*)    HCT 20.3 (*)    All other components within normal limits  PROTIME-INR  POC OCCULT BLOOD, ED  TYPE AND SCREEN    EKG None  Radiology No results found.  Procedures Procedures    Medications Ordered in ED Medications - No data to display  ED Course/ Medical Decision Making/ A&P                           Medical Decision Making Amount and/or Complexity of Data Reviewed Labs: ordered.   Patient presents with recurrent anemia.  Hemoglobin down to 6.2 when it was 6.8 yesterday.  Likely GI source and has black stool and has recent bleeds.  Hemoglobin decreasing and is symptomatic.  However vitals reassuring at this point.  Reviewed recent notes.  Discussed with [facility], who will see patient.  Patient has only had some coffee this morning.  Will discuss with hospitalist for admission.  CRITICAL CARE Performed by: Davonna Belling Total critical care time: 30 minutes Critical care time was exclusive of separately billable procedures and treating other patients. Critical care was necessary to treat or prevent imminent or life-threatening deterioration. Critical care was time spent personally by me on the following activities: development of treatment plan with patient and/or surrogate as well as nursing, discussions with consultants, evaluation of patient's response to treatment, examination of patient, obtaining history from patient or surrogate, ordering and performing treatments and interventions, ordering and review of laboratory studies, ordering and review of radiographic studies, pulse oximetry and re-evaluation of patient's condition.         Final Clinical Impression(s) / ED  Diagnoses Final diagnoses:  Gastrointestinal hemorrhage, unspecified gastrointestinal hemorrhage type  Symptomatic anemia    Rx / DC Orders ED Discharge Orders     None         Davonna Belling, MD 09/25/21 1034

## 2021-09-25 NOTE — Consult Note (Addendum)
Neurology Consultation  Reason for Consult: word finding issues  Referring Physician: Dr. Tamala [name]  CC: GI Bleed  History is obtained from:patient and medical record   HPI: Stacey Reyes is a 76 y.o. female with a past medical history of HTN, HLD, anemia who is admitted for a GI bleed. During her admission history intake by primary RN @ 1645 patient developed acute onset of word finding difficulty and word salad for ~15 minutes. Patient was aware of event and could understand all that was being said or asked. She could not find the right words. Stroke RN was called who then called neurology for guidance.  On arrival to patients bedside, RN and Stroke response RN present. Patient is receiving PRBC. On exam, she is completely back to baseline, NIHSS 0. She is oriented x4, no aphasia or dysarthria, tracks. No facial droop, 5/5 in all 4 extremities, no ataxia noted.    LKW: 4403 IV thrombolysis given?: not offered, symptoms resolved  Thrombectomy: not offered, symptoms resolved. Premorbid modified Rankin scale (mRS):  0-Completely asymptomatic and back to baseline post-stroke  ROS: Full ROS was performed and is negative except as noted in the HPI.    Past Medical History:  Diagnosis Date   Anemia    Blood transfusion without reported diagnosis    Cataract    removed bilateral   Chronic cystitis    Diverticulitis    Heart murmur    Hyperlipidemia    Hypertension    Personal history of colonic polyps-adenomas 01/07/2012   2009 - 2 diminutive adenomas (prior polyps also) 01/07/2012 - 2 diminutive adenomas       Family History  Problem Relation Age of Onset   Colon cancer Mother 41       80's   Breast cancer Sister    Asthma Brother    Colon polyps Neg Hx    Esophageal cancer Neg Hx    Rectal cancer Neg Hx    Stomach cancer Neg Hx      Social History:   reports that she quit smoking about 36 years ago. Her smoking use included cigarettes. She has never used smokeless  tobacco. She reports current alcohol use. She reports that she does not use drugs.  Medications  Current Facility-Administered Medications:    acetaminophen (TYLENOL) tablet 650 mg, 650 mg, Oral, Q6H PRN, 650 mg at 09/25/21 1639 **OR** acetaminophen (TYLENOL) suppository 650 mg, 650 mg, Rectal, Q6H PRN, Tamala [name], Rondell A, MD   albuterol (PROVENTIL) (2.5 MG/3ML) 0.083% nebulizer solution 2.5 mg, 2.5 mg, Nebulization, Q6H PRN, Tamala [name], Rondell A, MD   loratadine (CLARITIN) tablet 10 mg, 10 mg, Oral, QHS PRN, Smith, Rondell A, MD   ondansetron (ZOFRAN) tablet 4 mg, 4 mg, Oral, Q6H PRN **OR** ondansetron (ZOFRAN) injection 4 mg, 4 mg, Intravenous, Q6H PRN, Tamala [name], Rondell A, MD   pantoprazole (PROTONIX) injection 40 mg, 40 mg, Intravenous, Q12H, Smith, Rondell A, MD   rosuvastatin (CRESTOR) tablet 10 mg, 10 mg, Oral, QHS, Smith, Rondell A, MD   sodium chloride flush (NS) 0.9 % injection 3 mL, 3 mL, Intravenous, Q12H, Smith, Rondell A, MD   traZODone (DESYREL) tablet 50 mg, 50 mg, Oral, QHS PRN, Norval Morton, MD   Exam: Current vital signs: BP (!) 123/97   Pulse 75   Temp 98.6 F (37 C)   Resp 17   Ht '5\' 6"'$  (1.676 m)   Wt 91.3 kg   SpO2 96%   BMI 32.49 kg/m  Vital signs in last  24 hours: Temp:  [97.7 F (36.5 C)-99 F (37.2 C)] 98.6 F (37 C) (07/04 1700) Pulse Rate:  [65-76] 75 (07/04 1700) Resp:  [13-22] 17 (07/04 1700) BP: (95-123)/(41-97) 123/97 (07/04 1700) SpO2:  [95 %-100 %] 96 % (07/04 1656) Weight:  [91.3 kg-93.4 kg] 91.3 kg (07/04 1413)  GENERAL: Awake, alert in NAD HEENT: - Normocephalic and atraumatic, dry mm LUNGS - Clear to auscultation bilaterally with no wheezes CV - S1S2 RRR, no m/r/g, equal pulses bilaterally. ABDOMEN - Soft, nontender, nondistended with normoactive BS Ext: warm, well perfused, intact peripheral pulses, no edema  NEURO:  Mental Status: AA&Ox4 Language: speech is clear.  Naming, repetition, fluency, and comprehension intact. Cranial Nerves:  PERRL, EOMI, visual fields full, no facial asymmetry, facial sensation intact, hearing intact, tongue/uvula/soft palate midline, normal sternocleidomastoid and trapezius muscle strength. No evidence of tongue atrophy or fibrillations Motor: 5/5 in all 4 extremities Tone: is normal and bulk is normal Sensation- Intact to light touch bilaterally Coordination: FTN intact bilaterally, no ataxia in BLE. Gait- deferred  NIHSS 1a Level of Conscious.: 0 1b LOC Questions: 0 1c LOC Commands: 0 2 Best Gaze: 0 3 Visual: 0 4 Facial Palsy: 0 5a Motor Arm - left: 0 5b Motor Arm - Right: 0 6a Motor Leg - Left: 0 6b Motor Leg - Right: 0 7 Limb Ataxia: 0 8 Sensory: 0 9 Best Language: 0 10 Dysarthria: 0 11 Extinct. and Inatten.: 0 TOTAL: 0   Assessment:  Stacey Reyes is a 76 y.o. female with a past medical history of HTN, HLD, anemia who is admitted for a GI bleed. During her admission history intake by primary RN patient developed acute onset of word finding difficulty and word salad for ~15 minutes which spontaneously resolved.  TIA vs Stroke   Recommendations: - HgbA1c, fasting lipid panel - MRI of the brain without contrast - Frequent neuro checks - Echocardiogram - CT head and CTA head and neck - Prophylactic therapy-Antiplatelet med: Aspirin - dose '81mg'$  if and when cleared by GI team.(Not ordered) - Risk factor modification - Telemetry monitoring - PT consult, OT consult, Speech consult - Stroke team to follow  [place] Performed a face to face diagnostic evaluation.   I have reviewed the contents of history and physical exam as documented by PA/ARNP/Resident and agree with above documentation.  I have discussed and formulated the above plan as documented. Edits to the note have been made as needed.   Donnetta Simpers, MD [facility] [phone]   If 7pm to 7am, please call on call as listed on AMION.

## 2021-09-25 NOTE — Progress Notes (Signed)
Stacey Reyes is a 76 yr old woman being admitted for anemia and GIB workup. She was last seen well at 1415. At 1645, she was acutely unable to answer admission questions being asked by RN Seth Bake. She had a few moments of "jibberish" nonsensical speech, which she was aware of. RN contacted RRN/SRN. SRN to bedside at 1700. Pt alert and fluent, able to recount to me what had happened.  CBG, BP WNL. She described all objects on card, and described the cookie jar scene. She was unable to recall the word "fireworks" in our conversation. Pt was examined by Neurologist Dr. Lorrin Goodell. Pt's speech was fluent  with only difficulty finding the word for "knuckles". Pt will need to be monitored q 2 hours with VS and NIHSS. Should aphasia or any other symptoms reoccur, RN should call a "Code Stroke". Dr. Lorrin Goodell to discuss with Attending physician Dr. Fuller Plan.

## 2021-09-25 NOTE — ED Triage Notes (Signed)
Pt c/o black stoolx1 wk. Pt states PCP sent here for hemoglobin 6.8. Pt states she's having SOB and dizzinessx1.5 wks.

## 2021-09-26 ENCOUNTER — Telehealth: Payer: Self-pay | Admitting: Family Medicine

## 2021-09-26 ENCOUNTER — Observation Stay (HOSPITAL_BASED_OUTPATIENT_CLINIC_OR_DEPARTMENT_OTHER): Payer: Medicare Other | Admitting: Critical Care Medicine

## 2021-09-26 ENCOUNTER — Observation Stay (HOSPITAL_COMMUNITY): Payer: Medicare Other | Admitting: Critical Care Medicine

## 2021-09-26 ENCOUNTER — Encounter (HOSPITAL_COMMUNITY): Payer: Self-pay | Admitting: Internal Medicine

## 2021-09-26 ENCOUNTER — Observation Stay (HOSPITAL_COMMUNITY): Payer: Medicare Other

## 2021-09-26 ENCOUNTER — Observation Stay (HOSPITAL_BASED_OUTPATIENT_CLINIC_OR_DEPARTMENT_OTHER): Payer: Medicare Other

## 2021-09-26 ENCOUNTER — Ambulatory Visit: Payer: Medicare Other | Admitting: Family Medicine

## 2021-09-26 ENCOUNTER — Encounter (HOSPITAL_COMMUNITY)
Admission: EM | Disposition: A | Discharge status: Home / Self Care | Payer: Self-pay | Source: Home / Self Care | Attending: Emergency Medicine

## 2021-09-26 DIAGNOSIS — Z87891 Personal history of nicotine dependence: Secondary | ICD-10-CM

## 2021-09-26 DIAGNOSIS — I10 Essential (primary) hypertension: Secondary | ICD-10-CM

## 2021-09-26 DIAGNOSIS — Z8719 Personal history of other diseases of the digestive system: Secondary | ICD-10-CM | POA: Diagnosis not present

## 2021-09-26 DIAGNOSIS — E782 Mixed hyperlipidemia: Secondary | ICD-10-CM | POA: Diagnosis not present

## 2021-09-26 DIAGNOSIS — K922 Gastrointestinal hemorrhage, unspecified: Secondary | ICD-10-CM | POA: Diagnosis not present

## 2021-09-26 DIAGNOSIS — D62 Acute posthemorrhagic anemia: Secondary | ICD-10-CM | POA: Diagnosis not present

## 2021-09-26 DIAGNOSIS — K317 Polyp of stomach and duodenum: Secondary | ICD-10-CM

## 2021-09-26 DIAGNOSIS — Z8673 Personal history of transient ischemic attack (TIA), and cerebral infarction without residual deficits: Secondary | ICD-10-CM | POA: Diagnosis not present

## 2021-09-26 DIAGNOSIS — G459 Transient cerebral ischemic attack, unspecified: Secondary | ICD-10-CM | POA: Diagnosis not present

## 2021-09-26 HISTORY — PX: GIVENS CAPSULE STUDY: SHX5432

## 2021-09-26 HISTORY — PX: ENTEROSCOPY: SHX5533

## 2021-09-26 HISTORY — PX: SUBMUCOSAL TATTOO INJECTION: SHX6856

## 2021-09-26 LAB — TYPE AND SCREEN
ABO/RH(D): O POS
Antibody Screen: NEGATIVE
Unit division: 0
Unit division: 0

## 2021-09-26 LAB — CBC WITH DIFFERENTIAL/PLATELET
Basophils Absolute: 0.1 10*3/uL (ref 0.0–0.2)
Basos: 1 %
EOS (ABSOLUTE): 0.2 10*3/uL (ref 0.0–0.4)
Eos: 4 %
Hematocrit: 21.8 % — ABNORMAL LOW (ref 34.0–46.6)
Hemoglobin: 6.8 g/dL — CL (ref 11.1–15.9)
Immature Grans (Abs): 0 10*3/uL (ref 0.0–0.1)
Immature Granulocytes: 0 %
Lymphocytes Absolute: 1.7 10*3/uL (ref 0.7–3.1)
Lymphs: 31 %
MCH: 28.9 pg (ref 26.6–33.0)
MCHC: 31.2 g/dL — ABNORMAL LOW (ref 31.5–35.7)
MCV: 93 fL (ref 79–97)
Monocytes Absolute: 0.5 10*3/uL (ref 0.1–0.9)
Monocytes: 10 %
Neutrophils Absolute: 2.8 10*3/uL (ref 1.4–7.0)
Neutrophils: 54 %
Platelets: 228 10*3/uL (ref 150–450)
RBC: 2.35 x10E6/uL — CL (ref 3.77–5.28)
RDW: 14.5 % (ref 11.7–15.4)
WBC: 5.3 10*3/uL (ref 3.4–10.8)

## 2021-09-26 LAB — BASIC METABOLIC PANEL
Anion gap: 9 (ref 5–15)
BUN: 12 mg/dL (ref 8–23)
CO2: 25 mmol/L (ref 22–32)
Calcium: 8.5 mg/dL — ABNORMAL LOW (ref 8.9–10.3)
Chloride: 105 mmol/L (ref 98–111)
Creatinine, Ser: 0.74 mg/dL (ref 0.44–1.00)
GFR, Estimated: >60 mL/min (ref >60)
Glucose, Bld: 96 mg/dL (ref 70–99)
Potassium: 3.5 mmol/L (ref 3.5–5.1)
Sodium: 139 mmol/L (ref 135–145)

## 2021-09-26 LAB — ECHOCARDIOGRAM COMPLETE
AV Mean grad: 14 mmHg
AV Peak grad: 23.7 mmHg
Ao pk vel: 2.43 m/s
Area-P 1/2: 3.7 cm2
Calc EF: 63.5 %
Height: 66 in
S' Lateral: 2.2 cm
Single Plane A2C EF: 65.6 %
Single Plane A4C EF: 63.3 %
Weight: 3220.48 oz

## 2021-09-26 LAB — BPAM RBC
Blood Product Expiration Date: 202308032359
Blood Product Expiration Date: 202308032359
ISSUE DATE / TIME: 202307041151
ISSUE DATE / TIME: 202307041642
Unit Type and Rh: 5100
Unit Type and Rh: 5100

## 2021-09-26 LAB — LIPID PANEL
Cholesterol: 99 mg/dL (ref 0–200)
HDL: 28 mg/dL — ABNORMAL LOW (ref >40)
LDL Cholesterol: 45 mg/dL (ref 0–99)
Total CHOL/HDL Ratio: 3.5 RATIO
Triglycerides: 130 mg/dL (ref <150)
VLDL: 26 mg/dL (ref 0–40)

## 2021-09-26 LAB — HEMOGLOBIN A1C
Hgb A1c MFr Bld: 5.1 % (ref 4.8–5.6)
Mean Plasma Glucose: 99.67 mg/dL

## 2021-09-26 LAB — HEMOGLOBIN AND HEMATOCRIT, BLOOD
HCT: 22.2 % — ABNORMAL LOW (ref 36.0–46.0)
HCT: 23.3 % — ABNORMAL LOW (ref 36.0–46.0)
HCT: 24.6 % — ABNORMAL LOW (ref 36.0–46.0)
Hemoglobin: 7.6 g/dL — ABNORMAL LOW (ref 12.0–15.0)
Hemoglobin: 7.8 g/dL — ABNORMAL LOW (ref 12.0–15.0)
Hemoglobin: 7.8 g/dL — ABNORMAL LOW (ref 12.0–15.0)

## 2021-09-26 SURGERY — ENTEROSCOPY
Anesthesia: Monitor Anesthesia Care

## 2021-09-26 MED ORDER — SPOT INK MARKER SYRINGE KIT
PACK | SUBMUCOSAL | Status: DC | PRN
  Administered 2021-09-26: 5 mL via SUBMUCOSAL

## 2021-09-26 MED ORDER — PROPOFOL 10 MG/ML IV BOLUS
INTRAVENOUS | Status: DC | PRN
  Administered 2021-09-26: 20 mg via INTRAVENOUS
  Administered 2021-09-26: 10 mg via INTRAVENOUS
  Administered 2021-09-26: 20 mg via INTRAVENOUS

## 2021-09-26 MED ORDER — LIDOCAINE 2% (20 MG/ML) 5 ML SYRINGE
INTRAMUSCULAR | Status: DC | PRN
  Administered 2021-09-26: 20 mg via INTRAVENOUS

## 2021-09-26 MED ORDER — LACTATED RINGERS IV SOLN
INTRAVENOUS | Status: AC | PRN
  Administered 2021-09-26: 20 mL/h via INTRAVENOUS

## 2021-09-26 MED ORDER — PROPOFOL 500 MG/50ML IV EMUL
INTRAVENOUS | Status: DC | PRN
  Administered 2021-09-26: 150 ug/kg/min via INTRAVENOUS

## 2021-09-26 MED ORDER — PHENYLEPHRINE HCL-NACL 20-0.9 MG/250ML-% IV SOLN
INTRAVENOUS | Status: DC | PRN
  Administered 2021-09-26: 25 ug/min via INTRAVENOUS

## 2021-09-26 MED ORDER — SPOT INK MARKER SYRINGE KIT
PACK | SUBMUCOSAL | Status: AC
  Filled 2021-09-26: qty 5

## 2021-09-26 NOTE — Telephone Encounter (Signed)
Dr. Warrick Parisian is covering. He reviewed lab and instructed that pt needs to go to the ER.  Called patient and she is currently at [facility].

## 2021-09-26 NOTE — Progress Notes (Signed)
PT Cancellation Note  Patient Details Name: Stacey Reyes MRN: [id number] DOB: Oct 11, 1945   Cancelled Treatment:    Reason Eval/Treat Not Completed: Other (comment).  Pt is in surgery and will retry at another time.   Ramond Dial 09/26/2021, 10:18 AM  Mee Hives, PT PhD Acute Rehab Dept. Number: [place]

## 2021-09-26 NOTE — Anesthesia Procedure Notes (Signed)
Procedure Name: MAC Date/Time: 09/26/2021 8:56 AM  Performed by: Wilburn Cornelia, CRNAPre-anesthesia Checklist: Patient identified, Emergency Drugs available, Suction available, Patient being monitored and Timeout performed Patient Re-evaluated:Patient Re-evaluated prior to induction Oxygen Delivery Method: Nasal cannula Placement Confirmation: positive ETCO2 and breath sounds checked- equal and bilateral Dental Injury: Teeth and Oropharynx as per pre-operative assessment

## 2021-09-26 NOTE — Progress Notes (Addendum)
Progress Note  Patient: Stacey Reyes WJX:[id number] DOB: Jan 07, 1946  DOA: 09/25/2021  DOS: 09/26/2021    Brief hospital course: FARZANA KOCI is a 76 y.o. female with a history of HTN, HLD, anemia, GI bleeding who presented to the ED 7/4 with melanotic stools and worsening anemia (hgb 6.8g/dl), after having been admitted 6/25-6/28 for GI bleeding. EGD at that previous admission revealed blood in the duodenum without active bleeding source located, suspected Dieulafoy lesion leading to her presentation, discharged on PPI. She had also been admitted Spet 2022 when she underwent empiric coil embolization of the GDA and a hypertrophied pancreaticoduodenal artery with IR on 12/05/2020.  The EGD on 12/11/2020 noted blood in duodenal bulb and second portion, one bleeding AVM s/p epi, but unsuccessful, s/p clips X 3 and hemospray.  Patient underwent coil embolization of inferior pancreaticoduodenal artery from SMA and exoseal for hemostasis with IR on 12/13/2020.   In the ED, hgb was 6.2g/dl for which 2u PRBCs were given and GI consulted. During transfusion the patient developed about 15 minutes of transient word blocking, expressive aphasia for which neurology was consulted CT head, CTA head and neck, and MR brain have been unrevealing and symptoms abated spontaneously.   EGD 7/5 demonstrated multiple gastric polyps, normal small intestine to jejunum. VCE placed at that time. GI advised against antiplatelet given her recurrent GI bleeding episodes.  Assessment and Plan: Acute blood loss anemia secondary to recurrent cryptogenic upper GI bleed:  - Hgb improved with 2u PRBCs, recheck H/H serially. Thus far, hgb has not rebounded as would be anticipated. - No bleeding source on EGD, and no blood noted either (has been seen in the past, suspected Dieulafoy lesion).  - Follow up GI recommendations after capsule endoscopy tomorrow. - Continuing PPI as previously directed. - SCDs for VTE ppx.  Transient  expressive aphasia/word blocking: Without other neurological signs or symptoms, spontaneously resolved, neuroimaging negative.  - Neurology consulted, d/w neurology. Would otherwise start antiplatelet, though risk of bleeding in this patient is prohibitive in the setting of such reassuring neurological work up.   HTN: Note severe LVH on echo, though LVEF preserved.  - Continue to hold HCTZ, bisoprolol while pressures normotensive and GI bleed suspected.  HLD:  - Continue statin  Obesity: Body mass index is 32.49 kg/m.   Subjective: Seen after EGD, feels less fatigued/exertionally short of breath. No stools today. No further word finding difficulties or weakness or numbness.   Objective: Vitals:   09/26/21 0945 09/26/21 1000 09/26/21 1155 09/26/21 1526  BP: 114/61 (!) 111/58 107/61 (!) 112/56  Pulse: 71 68 66 82  Resp: '12 17 18   '$ Temp:  (P) 98.9 F (37.2 C) 98.2 F (36.8 C) 98 F (36.7 C)  TempSrc:   Oral Oral  SpO2: 94% 98% 95%   Weight:      Height:       Gen:  76 y.o. female in no distress Pulm: Nonlabored breathing room air. Clear CV: Regular rate and rhythm. No murmur, rub, or gallop. No JVD, no dependent edema. GI: Abdomen soft, non-tender, non-distended, with normoactive bowel sounds.  Ext: Warm, no deformities Skin: No rashes, lesions or ulcers on visualized skin. Neuro: Alert and oriented. No focal neurological deficits. Psych: Judgement and insight appear fair. Mood euthymic & affect congruent. Behavior is appropriate.    Data Personally reviewed: CBC: Recent Labs  Lab 09/24/21 0839 09/25/21 0918 09/25/21 2124 09/26/21 0043 09/26/21 0548  WBC 5.3 5.4  --   --   --  NEUTROABS 2.8  --   --   --   --   HGB 6.8* 6.2* 7.7* 7.8* 7.6*  HCT 21.8* 20.3* 22.9* 23.3* 22.2*  MCV 93 94.9  --   --   --   PLT 228 205  --   --   --    Basic Metabolic Panel: Recent Labs  Lab 09/25/21 0918 09/26/21 0548  NA 140 139  K 3.8 3.5  CL 103 105  CO2 24 25  GLUCOSE 110*  96  BUN 18 12  CREATININE 0.80 0.74  CALCIUM 9.2 8.5*   GFR: Estimated Creatinine Clearance: 68.1 mL/min (by C-G formula based on SCr of 0.74 mg/dL). Liver Function Tests: Recent Labs  Lab 09/25/21 0918  AST 29  ALT 14  ALKPHOS 20*  BILITOT 0.6  PROT 5.5*  ALBUMIN 3.4*   No results for input(s): "LIPASE", "AMYLASE" in the last 168 hours. No results for input(s): "AMMONIA" in the last 168 hours. Coagulation Profile: Recent Labs  Lab 09/25/21 1030  INR 1.1   Cardiac Enzymes: No results for input(s): "CKTOTAL", "CKMB", "CKMBINDEX", "TROPONINI" in the last 168 hours. BNP (last 3 results) Recent Labs    10/19/20 1244 10/25/20 1112  PROBNP 2,974* 1,440*   HbA1C: Recent Labs    09/26/21 0043  HGBA1C 5.1   CBG: Recent Labs  Lab 09/25/21 1701  GLUCAP 90   Lipid Profile: Recent Labs    09/26/21 0043  CHOL 99  HDL 28*  LDLCALC 45  TRIG 130  CHOLHDL 3.5   Thyroid Function Tests: No results for input(s): "TSH", "T4TOTAL", "FREET4", "T3FREE", "THYROIDAB" in the last 72 hours. Anemia Panel: No results for input(s): "VITAMINB12", "FOLATE", "FERRITIN", "TIBC", "IRON", "RETICCTPCT" in the last 72 hours. Urine analysis:    Component Value Date/Time   COLORURINE YELLOW 11/14/2019 0851   APPEARANCEUR Clear 10/19/2020 1215   LABSPEC 1.015 11/14/2019 0851   PHURINE 8.0 11/14/2019 0851   GLUCOSEU Negative 10/19/2020 1215   HGBUR NEGATIVE 11/14/2019 0851   BILIRUBINUR Negative 10/19/2020 [place] 11/14/2019 0851   PROTEINUR Negative 10/19/2020 1215   PROTEINUR NEGATIVE 11/14/2019 0851   UROBILINOGEN negative 02/24/2015 1344   NITRITE Negative 10/19/2020 1215   NITRITE NEGATIVE 11/14/2019 0851   LEUKOCYTESUR Negative 10/19/2020 1215   LEUKOCYTESUR TRACE (A) 11/14/2019 0851   No results found for this or any previous visit (from the past 240 hour(s)).   ECHOCARDIOGRAM COMPLETE  Result Date: 09/26/2021    ECHOCARDIOGRAM REPORT   Patient Name:    ASSIA MEANOR Date of Exam: 09/26/2021 Medical Rec #:  [id number]         Height:       66.0 in Accession #:    [id number]        Weight:       201.3 lb Date of Birth:  Apr 06, 1945         BSA:          2.005 m Patient Age:    64 years          BP:           126/69 mmHg Patient Gender: F                 HR:           64 bpm. Exam Location:  Inpatient Procedure: 2D Echo, Cardiac Doppler and Color Doppler Indications:    TIA  History:        Patient has prior  history of Echocardiogram examinations. Risk                 Factors:Hypertension.  Sonographer:    Jyl Heinz Referring Phys: [place]  1. Left ventricular ejection fraction, by estimation, is 60 to 65%. The left ventricle has normal function. The left ventricle has no regional wall motion abnormalities. There is severe concentric left ventricular hypertrophy. Left ventricular diastolic  parameters are consistent with Grade I diastolic dysfunction (impaired relaxation).  2. Right ventricular systolic function is normal. The right ventricular size is normal.  3. Left atrial size was severely dilated.  4. The mitral valve is normal in structure. Trivial mitral valve regurgitation. No evidence of mitral stenosis.  5. The aortic valve is normal in structure. Aortic valve regurgitation is not visualized. No aortic stenosis is present.  6. The inferior vena cava is normal in size with greater than 50% respiratory variability, suggesting right atrial pressure of 3 mmHg. FINDINGS  Left Ventricle: Left ventricular ejection fraction, by estimation, is 60 to 65%. The left ventricle has normal function. The left ventricle has no regional wall motion abnormalities. The left ventricular internal cavity size was normal in size. There is  severe concentric left ventricular hypertrophy. Left ventricular diastolic parameters are consistent with Grade I diastolic dysfunction (impaired relaxation). Right Ventricle: The right ventricular size is normal. No  increase in right ventricular wall thickness. Right ventricular systolic function is normal. Left Atrium: Left atrial size was severely dilated. Right Atrium: Right atrial size was normal in size. Pericardium: There is no evidence of pericardial effusion. Presence of epicardial fat layer. Mitral Valve: The mitral valve is normal in structure. Mild mitral annular calcification. Trivial mitral valve regurgitation. No evidence of mitral valve stenosis. Tricuspid Valve: The tricuspid valve is normal in structure. Tricuspid valve regurgitation is trivial. No evidence of tricuspid stenosis. Aortic Valve: The aortic valve is normal in structure. Aortic valve regurgitation is not visualized. No aortic stenosis is present. Aortic valve mean gradient measures 14.0 mmHg. Aortic valve peak gradient measures 23.7 mmHg. Pulmonic Valve: The pulmonic valve was normal in structure. Pulmonic valve regurgitation is not visualized. No evidence of pulmonic stenosis. Aorta: The aortic root is normal in size and structure. Venous: The inferior vena cava is normal in size with greater than 50% respiratory variability, suggesting right atrial pressure of 3 mmHg. IAS/Shunts: No atrial level shunt detected by color flow Doppler.  LEFT VENTRICLE PLAX 2D LVIDd:         3.90 cm      Diastology LVIDs:         2.20 cm      LV e' medial:    5.45 cm/s LV PW:         1.70 cm      LV E/e' medial:  16.8 LV IVS:        1.60 cm      LV e' lateral:   3.94 cm/s LVOT diam:     2.00 cm      LV E/e' lateral: 23.3 LVOT Area:     3.14 cm  LV Volumes (MOD) LV vol d, MOD A2C: 135.0 ml LV vol d, MOD A4C: 135.0 ml LV vol s, MOD A2C: 46.5 ml LV vol s, MOD A4C: 49.6 ml LV SV MOD A2C:     88.5 ml LV SV MOD A4C:     135.0 ml LV SV MOD BP:      86.2 ml RIGHT VENTRICLE  IVC RV Basal diam:  3.40 cm     IVC diam: 1.90 cm RV Mid diam:    3.00 cm RV S prime:     17.30 cm/s TAPSE (M-mode): 2.8 cm LEFT ATRIUM             Index        RIGHT ATRIUM           Index LA  diam:        3.70 cm 1.85 cm/m   RA Area:     17.60 cm LA Vol (A2C):   94.1 ml 46.93 ml/m  RA Volume:   42.70 ml  21.29 ml/m LA Vol (A4C):   98.9 ml 49.32 ml/m LA Biplane Vol: 96.7 ml 48.22 ml/m  AORTIC VALVE AV Vmax:      243.33 cm/s AV Vmean:     183.667 cm/s AV VTI:       0.579 m AV Peak Grad: 23.7 mmHg AV Mean Grad: 14.0 mmHg  AORTA Ao Root diam: 3.40 cm Ao Asc diam:  3.20 cm MITRAL VALVE                TRICUSPID VALVE MV Area (PHT): 3.70 cm     TR Peak grad:   16.6 mmHg MV Decel Time: 205 msec     TR Vmax:        204.00 cm/s MV E velocity: 91.80 cm/s MV A velocity: 121.00 cm/s  SHUNTS MV E/A ratio:  0.76         Systemic Diam: 2.00 cm Kardie Tobb DO Electronically signed by Berniece Salines DO Signature Date/Time: 09/26/2021/2:51:11 PM    Final    MR BRAIN WO CONTRAST  Result Date: 09/25/2021 CLINICAL DATA:  TIA EXAM: MRI HEAD WITHOUT CONTRAST TECHNIQUE: Multiplanar, multiecho pulse sequences of the brain and surrounding structures were obtained without intravenous contrast. COMPARISON:  Same-day CTA head/neck FINDINGS: Brain: There is no acute intracranial hemorrhage, extra-axial fluid collection, or acute infarct. Parenchymal volume is normal for age. The ventricles are normal in size. Gray-white differentiation is preserved. There are a few tiny foci of FLAIR signal abnormality in the subcortical and periventricular white matter likely reflecting minimal chronic white matter microangiopathy. There is no suspicious parenchymal signal abnormality. There is no mass lesion. There is no mass effect or midline shift. Vascular: Normal flow voids. Skull and upper cervical spine: Normal marrow signal. Sinuses/Orbits: The paranasal sinuses are clear. Bilateral lens implants are in place. The globes and orbits are otherwise unremarkable. Other: None. IMPRESSION: Normal for age brain MRI with no acute intracranial pathology. Electronically Signed   By: Valetta Mole M.D.   On: 09/25/2021 19:46   CT ANGIO HEAD NECK W  WO CM  Result Date: 09/25/2021 CLINICAL DATA:  Acute neurologic deficit EXAM: CT ANGIOGRAPHY HEAD AND NECK TECHNIQUE: Multidetector CT imaging of the head and neck was performed using the standard protocol during bolus administration of intravenous contrast. Multiplanar CT image reconstructions and MIPs were obtained to evaluate the vascular anatomy. Carotid stenosis measurements (when applicable) are obtained utilizing NASCET criteria, using the distal internal carotid diameter as the denominator. RADIATION DOSE REDUCTION: This exam was performed according to the departmental dose-optimization program which includes automated exposure control, adjustment of the mA and/or kV according to patient size and/or use of iterative reconstruction technique. CONTRAST:  71m OMNIPAQUE IOHEXOL 350 MG/ML SOLN COMPARISON:  None Available. FINDINGS: CT HEAD FINDINGS Brain: There is no mass, hemorrhage or extra-axial collection. The size and configuration of  the ventricles and extra-axial CSF spaces are normal. There is no acute or chronic infarction. The brain parenchyma is normal. Skull: The visualized skull base, calvarium and extracranial soft tissues are normal. Sinuses/Orbits: No fluid levels or advanced mucosal thickening of the visualized paranasal sinuses. No mastoid or middle ear effusion. The orbits are normal. CTA NECK FINDINGS SKELETON: There is no bony spinal canal stenosis. No lytic or blastic lesion. OTHER NECK: Normal pharynx, larynx and major salivary glands. No cervical lymphadenopathy. Unremarkable thyroid gland. UPPER CHEST: No pneumothorax or pleural effusion. No nodules or masses. AORTIC ARCH: There is calcific atherosclerosis of the aortic arch. There is no aneurysm, dissection or hemodynamically significant stenosis of the visualized portion of the aorta. Conventional 3 vessel aortic branching pattern. The visualized proximal subclavian arteries are widely patent. RIGHT CAROTID SYSTEM: Normal without  aneurysm, dissection or stenosis. LEFT CAROTID SYSTEM: Normal without aneurysm, dissection or stenosis. VERTEBRAL ARTERIES: Left dominant configuration. Both origins are clearly patent. There is no dissection, occlusion or flow-limiting stenosis to the skull base (V1-V3 segments). CTA HEAD FINDINGS POSTERIOR CIRCULATION: --Vertebral arteries: Normal V4 segments. --Inferior cerebellar arteries: Normal. --Basilar artery: Normal. --Superior cerebellar arteries: Normal. --Posterior cerebral arteries (PCA): Normal. ANTERIOR CIRCULATION: --Intracranial internal carotid arteries: Normal. --Anterior cerebral arteries (ACA): Normal. Both A1 segments are present. Patent anterior communicating artery (a-comm). --Middle cerebral arteries (MCA): Normal. VENOUS SINUSES: As permitted by contrast timing, patent. ANATOMIC VARIANTS: None Review of the MIP images confirms the above findings. IMPRESSION: 1. No emergent large vessel occlusion or high-grade stenosis of the intracranial or cervical arteries. 2. Aortic Atherosclerosis (ICD10-I70.0). Electronically Signed   By: Ulyses Jarred M.D.   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 Planned Discharge Destination: Home once cleared by neurology and GI  Patrecia Pour, MD 09/26/2021 5:11 PM Page by [email or web address]

## 2021-09-26 NOTE — TOC Initial Note (Signed)
Transition of Care [facility]) - Initial/Assessment Note    Patient Details  Name: Stacey Reyes MRN: [id number] Date of Birth: 03/06/1946  [facility]) CM/SW Contact:    Pollie Friar, RN Phone Number: 09/26/2021, 2:32 PM  Clinical Narrative:                 Patient is from home with her spouse. She denies any DME use. She manages her own medications and drives self as needed. N No f/u per therapies.  ToC following.  Expected Discharge Plan: Home/Self Care Barriers to Discharge: Continued Medical Work up   Patient Goals and CMS Choice        Expected Discharge Plan and Services Expected Discharge Plan: Home/Self Care       Living arrangements for the past 2 months: Single Family Home                                      Prior Living Arrangements/Services Living arrangements for the past 2 months: Single Family Home Lives with:: Spouse Patient language and need for interpreter reviewed:: Yes Do you feel safe going back to the place where you live?: Yes            Criminal Activity/Legal Involvement Pertinent to Current Situation/Hospitalization: No - Comment as needed  Activities of Daily Living Home Assistive Devices/Equipment: Grab bars in shower ADL Screening (condition at time of admission) Patient's cognitive ability adequate to safely complete daily activities?: Yes Is the patient deaf or have difficulty hearing?: No Does the patient have difficulty seeing, even when wearing glasses/contacts?: No Does the patient have difficulty concentrating, remembering, or making decisions?: No Patient able to express need for assistance with ADLs?: Yes Does the patient have difficulty dressing or bathing?: No Independently performs ADLs?: Yes (appropriate for developmental age) Does the patient have difficulty walking or climbing stairs?: No Weakness of Legs: None Weakness of Arms/Hands: None  Permission Sought/Granted                   Emotional Assessment Appearance:: Appears stated age Attitude/Demeanor/Rapport: Engaged Affect (typically observed): Accepting Orientation: : Oriented to Self, Oriented to Place, Oriented to  Time, Oriented to Situation   Psych Involvement: No (comment)  Admission diagnosis:  Upper GI bleed [K92.2] Symptomatic anemia [D64.9] Gastrointestinal hemorrhage, unspecified gastrointestinal hemorrhage type [K92.2] Patient Active Problem List   Diagnosis Date Noted   History of GI bleed 09/25/2021   Hypokalemia 09/16/2021   Melena    Elevated troponin 10/20/2020   Upper GI bleed 10/20/2020   Symptomatic anemia 10/19/2020   Morning headache 03/08/2020   Hypertrophic cardiomyopathy ([place]) 01/17/2020   Iron deficiency anemia 12/21/2019   Need for immunization against influenza 12/14/2019   Acute blood loss anemia 11/22/2019   Educated about COVID-19 virus infection 07/05/2019   Dyspnea on exertion 07/05/2019   B12 deficiency 03/25/2019   Absolute anemia 03/24/2019   Osteopenia after menopause 04/22/2018   Cardiac murmur due to mitral valve disorder 03/03/2018   Heart murmur 03/03/2018   LVH (left ventricular hypertrophy) 11/20/2015   Arrhythmia 10/11/2015   Hypertension, essential 02/24/2015   Hyperlipidemia 02/24/2015   Personal history of colonic polyps-adenomas 01/07/2012   PCP:  [name], DO Pharmacy:   [facility] ([place], [facility] [address] [place] [address] [place] [address] Phone: [phone] Fax:  [phone]  [id number] [place] [place] [address] [place] [address]: [phone]: [phone] [facility] [place] [place]: [phone]: [phone]    Social Determinants of Health (SDOH) Interventions    Readmission  Risk Interventions     No data to display

## 2021-09-26 NOTE — Interval H&P Note (Signed)
History and Physical Interval Note:  09/26/2021 8:52 AM  Stacey Reyes  has presented today for surgery, with the diagnosis of melena for 3 weeks.  blood w/o active bleeding on 6/26 EGD.  recurrent symptomatic anemia.  The various methods of treatment have been discussed with the patient and family. After consideration of risks, benefits and other options for treatment, the patient has consented to  Procedure(s): ENTEROSCOPY (N/A) as a surgical intervention.  The patient's history has been reviewed, patient examined, no change in status, stable for surgery.  I have reviewed the patient's chart and labs.  Questions were answered to the patient's satisfaction.    TIA sxs w/ negative neuro eval yesteday  Silvano Rusk

## 2021-09-26 NOTE — Transfer of Care (Signed)
Immediate Anesthesia Transfer of Care Note  Patient: Stacey Reyes  Procedure(s) Performed: ENTEROSCOPY SUBMUCOSAL TATTOO INJECTION GIVENS CAPSULE STUDY  Patient Location: PACU  Anesthesia Type:MAC  Level of Consciousness: drowsy  Airway & Oxygen Therapy: Patient Spontanous Breathing and Patient connected to nasal cannula oxygen  Post-op Assessment: Report given to RN and Post -op Vital signs reviewed and stable  Post vital signs: Reviewed and stable  Last Vitals:  Vitals Value Taken Time  BP 111/60   Temp    Pulse 73 09/26/21 0941  Resp 12 09/26/21 0941  SpO2 100 % 09/26/21 0941  Vitals shown include unvalidated device data.  Last Pain:  Vitals:   09/26/21 0819  TempSrc: Temporal  PainSc: 0-No pain         Complications: No notable events documented.

## 2021-09-26 NOTE — Anesthesia Preprocedure Evaluation (Signed)
Anesthesia Evaluation  Patient identified by MRN, date of birth, ID band Patient awake    Reviewed: Allergy & Precautions, NPO status , Patient's Chart, lab work & pertinent test results  Airway Mallampati: II  TM Distance: >3 FB Neck ROM: Full    Dental  (+) Dental Advisory Given   Pulmonary former smoker,    breath sounds clear to auscultation       Cardiovascular hypertension, Pt. on medications and Pt. on home beta blockers  Rhythm:Regular Rate:Normal  HOCM with SAM on 10/2020 Echo. EF 75%   Neuro/Psych TIA   GI/Hepatic Neg liver ROS, GI bleed   Endo/Other  negative endocrine ROS  Renal/GU negative Renal ROS     Musculoskeletal   Abdominal   Peds  Hematology  (+) Blood dyscrasia, anemia ,   Anesthesia Other Findings   Reproductive/Obstetrics                             Lab Results  Component Value Date   WBC 5.4 09/25/2021   HGB 7.6 (L) 09/26/2021   HCT 22.2 (L) 09/26/2021   MCV 94.9 09/25/2021   PLT 205 09/25/2021   Lab Results  Component Value Date   CREATININE 0.74 09/26/2021   BUN 12 09/26/2021   NA 139 09/26/2021   K 3.5 09/26/2021   CL 105 09/26/2021   CO2 25 09/26/2021    Anesthesia Physical Anesthesia Plan  ASA: 4  Anesthesia Plan: MAC   Post-op Pain Management: Minimal or no pain anticipated   Induction:   PONV Risk Score and Plan: 2 and Propofol infusion, Ondansetron and Treatment may vary due to age or medical condition  Airway Management Planned: Natural Airway and Nasal Cannula  Additional Equipment:   Intra-op Plan:   Post-operative Plan:   Informed Consent: I have reviewed the patients History and Physical, chart, labs and discussed the procedure including the risks, benefits and alternatives for the proposed anesthesia with the patient or authorized representative who has indicated his/her understanding and acceptance.       Plan  Discussed with:   Anesthesia Plan Comments:         Anesthesia Quick Evaluation

## 2021-09-26 NOTE — Telephone Encounter (Signed)
Santiago Glad from [employer] called with Critical Lab Result/  Hgb 6.8 Collected on 09/24/21

## 2021-09-26 NOTE — Progress Notes (Signed)
Speech Language Pathology Treatment:    Patient Details Name: Stacey Reyes MRN: [id number] DOB: 03/24/1946 Today's Date: 09/26/2021 Time:  -     Pt described her episode with aphasia yesterday that lasted 5 minutes then back to normal. Today there were no abnormalities noted in her speech-language-cognition and full assessment not warranted.                         Houston Siren  09/26/2021, 1:02 PM

## 2021-09-26 NOTE — Progress Notes (Addendum)
STROKE TEAM PROGRESS NOTE   INTERVAL HISTORY No family at the bedside. She had an enteroscopy capsule study started today. She is sitting up comfortably on the side of the bed. Neuro exam is non focal. She is hemodynamically stable. She was recently admitted to [facility] for GI bleed as well. EEG pending. Unable to use ASA due to GI bleed MRI brain is negative for acute infarct and CT angiograms do not show large vessel stenosis or occlusion. Vitals:   09/26/21 0215 09/26/21 0415 09/26/21 0637 09/26/21 0819  BP: (!) 96/38 (!) 115/46 126/69 (!) 135/59  Pulse: 68 62 67 64  Resp: '14 14 20 16  '$ Temp: 98.1 F (36.7 C) 98.1 F (36.7 C) 98.6 F (37 C) (!) 97.5 F (36.4 C)  TempSrc: Oral  Oral Temporal  SpO2:    97%  Weight:      Height:       CBC:  Recent Labs  Lab 09/24/21 0839 09/25/21 0918 09/25/21 2124 09/26/21 0043 09/26/21 0548  WBC 5.3 5.4  --   --   --   NEUTROABS 2.8  --   --   --   --   HGB 6.8* 6.2*   < > 7.8* 7.6*  HCT 21.8* 20.3*   < > 23.3* 22.2*  MCV 93 94.9  --   --   --   PLT 228 205  --   --   --    < > = values in this interval not displayed.   Basic Metabolic Panel:  Recent Labs  Lab 09/25/21 0918 09/26/21 0548  NA 140 139  K 3.8 3.5  CL 103 105  CO2 24 25  GLUCOSE 110* 96  BUN 18 12  CREATININE 0.80 0.74  CALCIUM 9.2 8.5*   Lipid Panel:  Recent Labs  Lab 09/26/21 0043  CHOL 99  TRIG 130  HDL 28*  CHOLHDL 3.5  VLDL 26  LDLCALC 45   HgbA1c:  Recent Labs  Lab 09/26/21 0043  HGBA1C 5.1   Urine Drug Screen: No results for input(s): "LABOPIA", "COCAINSCRNUR", "LABBENZ", "AMPHETMU", "THCU", "LABBARB" in the last 168 hours.  Alcohol Level No results for input(s): "ETH" in the last 168 hours.  IMAGING past 24 hours MR BRAIN WO CONTRAST  Result Date: 09/25/2021 CLINICAL DATA:  TIA EXAM: MRI HEAD WITHOUT CONTRAST TECHNIQUE: Multiplanar, multiecho pulse sequences of the brain and surrounding structures were obtained without intravenous  contrast. COMPARISON:  Same-day CTA head/neck FINDINGS: Brain: There is no acute intracranial hemorrhage, extra-axial fluid collection, or acute infarct. Parenchymal volume is normal for age. The ventricles are normal in size. Gray-white differentiation is preserved. There are a few tiny foci of FLAIR signal abnormality in the subcortical and periventricular white matter likely reflecting minimal chronic white matter microangiopathy. There is no suspicious parenchymal signal abnormality. There is no mass lesion. There is no mass effect or midline shift. Vascular: Normal flow voids. Skull and upper cervical spine: Normal marrow signal. Sinuses/Orbits: The paranasal sinuses are clear. Bilateral lens implants are in place. The globes and orbits are otherwise unremarkable. Other: None. IMPRESSION: Normal for age brain MRI with no acute intracranial pathology. Electronically Signed   By: Valetta Mole M.D.   On: 09/25/2021 19:46   CT ANGIO HEAD NECK W WO CM  Result Date: 09/25/2021 CLINICAL DATA:  Acute neurologic deficit EXAM: CT ANGIOGRAPHY HEAD AND NECK TECHNIQUE: Multidetector CT imaging of the head and neck was performed using the standard protocol during bolus administration of  intravenous contrast. Multiplanar CT image reconstructions and MIPs were obtained to evaluate the vascular anatomy. Carotid stenosis measurements (when applicable) are obtained utilizing NASCET criteria, using the distal internal carotid diameter as the denominator. RADIATION DOSE REDUCTION: This exam was performed according to the departmental dose-optimization program which includes automated exposure control, adjustment of the mA and/or kV according to patient size and/or use of iterative reconstruction technique. CONTRAST:  67m OMNIPAQUE IOHEXOL 350 MG/ML SOLN COMPARISON:  None Available. FINDINGS: CT HEAD FINDINGS Brain: There is no mass, hemorrhage or extra-axial collection. The size and configuration of the ventricles and  extra-axial CSF spaces are normal. There is no acute or chronic infarction. The brain parenchyma is normal. Skull: The visualized skull base, calvarium and extracranial soft tissues are normal. Sinuses/Orbits: No fluid levels or advanced mucosal thickening of the visualized paranasal sinuses. No mastoid or middle ear effusion. The orbits are normal. CTA NECK FINDINGS SKELETON: There is no bony spinal canal stenosis. No lytic or blastic lesion. OTHER NECK: Normal pharynx, larynx and major salivary glands. No cervical lymphadenopathy. Unremarkable thyroid gland. UPPER CHEST: No pneumothorax or pleural effusion. No nodules or masses. AORTIC ARCH: There is calcific atherosclerosis of the aortic arch. There is no aneurysm, dissection or hemodynamically significant stenosis of the visualized portion of the aorta. Conventional 3 vessel aortic branching pattern. The visualized proximal subclavian arteries are widely patent. RIGHT CAROTID SYSTEM: Normal without aneurysm, dissection or stenosis. LEFT CAROTID SYSTEM: Normal without aneurysm, dissection or stenosis. VERTEBRAL ARTERIES: Left dominant configuration. Both origins are clearly patent. There is no dissection, occlusion or flow-limiting stenosis to the skull base (V1-V3 segments). CTA HEAD FINDINGS POSTERIOR CIRCULATION: --Vertebral arteries: Normal V4 segments. --Inferior cerebellar arteries: Normal. --Basilar artery: Normal. --Superior cerebellar arteries: Normal. --Posterior cerebral arteries (PCA): Normal. ANTERIOR CIRCULATION: --Intracranial internal carotid arteries: Normal. --Anterior cerebral arteries (ACA): Normal. Both A1 segments are present. Patent anterior communicating artery (a-comm). --Middle cerebral arteries (MCA): Normal. VENOUS SINUSES: As permitted by contrast timing, patent. ANATOMIC VARIANTS: None Review of the MIP images confirms the above findings. IMPRESSION: 1. No emergent large vessel occlusion or high-grade stenosis of the intracranial or  cervical arteries. 2. Aortic Atherosclerosis (ICD10-I70.0). Electronically Signed   By: KUlyses JarredM.D.   On: 09/25/2021 19:25    PHYSICAL EXAM  Physical Exam  Constitutional: Appears well-developed and well-nourished.   Cardiovascular: Normal rate and regular rhythm.  Respiratory: Effort normal, non-labored breathing  Neuro: Mental Status: Patient is awake, alert, oriented to person, place, month, year, and situation. Patient is able to give a clear and coherent history. No signs of aphasia or neglect Cranial Nerves: II: Visual Fields are full. Pupils are equal, round, and reactive to light.   III,IV, VI: EOMI without ptosis or diploplia.  V: Facial sensation is symmetric to temperature VII: Facial movement is symmetric resting and smiling VIII: Hearing is intact to voice X: Palate elevates symmetrically XI: Shoulder shrug is symmetric. XII: Tongue protrudes midline without atrophy or fasciculations.  Motor: Tone is normal. Bulk is normal. 5/5 strength was present in all four extremities.  Sensory: Sensation is symmetric to light touch and temperature in the arms and legs. No extinction to DSS present.  Cerebellar: FNF and HKS are intact bilaterally    ASSESSMENT/PLAN Ms. Stacey GASCOIGNEis a 76y.o. female with history of HTN, HLD, anemia who is admitted for a GI bleed. During her intake she developed transient expressive aphasia lasting for approximately 15 minutes. Patient was aware of event and could understand  all that was being said or asked. She could not find the right words. She received two units of PRBCs yesterday and is undergoing a capsule endoscopy today.   TIA like episode-transient word finding difficulties only CTA head & neck No emergent large vessel occlusion or high-grade stenosis of the intracranial or cervical arteries. MRI  Normal brain MRI 2D Echo EF 60-65% EEG pending LDL 45 HgbA1c 5.1 VTE prophylaxis - SCDs    There are no active orders of  the following types: Diet, Nourishments.   No antithrombotic prior to admission, now on No antithrombotic. Given GI Bleeding and anemia Therapy recommendations:  pending Disposition:  pending  Hypertension Home meds:  zebeta, hydrochlorothiazide Stable Permissive hypertension (OK if < 220/120) but gradually normalize in 5-7 days Long-term BP goal normotensive  Hyperlipidemia Home meds:  Crestor '10mg'$ , resumed in hospital LDL 45, goal < 70 Continue statin at discharge  Other Stroke Risk Factors Advanced Age >/= 15   Other Active Problems GI Bleeding Capsule enteroscopy Transfuse to keep Hgb above 7.0  Hospital day # 0  Patient seen and examined by NP/APP with MD. MD to update note as needed.   Janine Ores, DNP, FNP-BC [facility] Pager: [phone]  STROKE MD NOTE :  I have personally obtained history,examined this patient, reviewed notes, independently viewed imaging studies, participated in medical decision making and plan of care.ROS completed by me personally and pertinent positives fully documented  I have made any additions or clarifications directly to the above note. Agree with note above.  Patient presented with GI bleed and developed transient word finding difficulties for a brief moment only without any other complaints stroke or TIA symptoms.  Unclear if this represents a true TIA versus mild cognitive impairment.  Hence recommend holding antiplatelet therapy due to GI bleed and work-up incomplete.  Check EEG for seizure activity.  From discussion patient and Dr.Grunz and answered questions.  Greater than 50% time during this 50-minute visit was spent in counseling and coordination of care about an episode of speech difficulties and discussion of differential diagnosis and evaluation treatment plan and answering questions.  Antony Contras, MD Medical Director [facility] Pager: [phone] 09/26/2021 5:31 PM   To contact Stroke Continuity  provider, please refer to [email or web address] After hours, contact General Neurology

## 2021-09-26 NOTE — Progress Notes (Signed)
PT Cancellation Note  Patient Details Name: Stacey Reyes MRN: [id number] DOB: Jan 11, 1946   Cancelled Treatment:    Reason Eval/Treat Not Completed: Other (comment).  Reports she feels she is getting around fine, no PT needed.  Will dc therapy for now but please reorder if any needs change.   Ramond Dial 09/26/2021, 3:20 PM  Mee Hives, PT PhD Acute Rehab Dept. Number: [place] and [place]

## 2021-09-26 NOTE — Progress Notes (Addendum)
Occupational Therapy Evaluation Patient Details Name: Stacey Reyes MRN: [id number] DOB: 03-11-1946 Today's Date: 09/26/2021   History of Present Illness Pt is a 76 y/o F admitted for GI bleed, of note recently hospitalized 6/25-6/28 at [facility] for upper GI bleed). Pt with acute wordfinding difficutly upon admission, CT and MRI negative. PMH includes HTN, HLD, anemia, colon polyps, GI bleed, and anemia.   Clinical Impression   Pt independent at baseline with ADLs and functional mobility, lives with spouse who can assist at d/c, also reports she has family that lives with in a few miles. Pt currently mod I -supervision for ADLs, mod I for bed mobility, and supervision for transfers. Pt reporting mild weakness in BLE's and all speech symptoms have resolved, educated pt on BEFAST and pt verbalizes understanding. Pt presenting with impairments listed below, however has no acute OT needs at this time, will s/o. Please reconsult if there is a change in pt status. Recommend d/c home with family assistance.      Recommendations for follow up therapy are one component of a multi-disciplinary discharge planning process, led by the attending physician.  Recommendations may be updated based on patient status, additional functional criteria and insurance authorization.   Follow Up Recommendations  No OT follow up    Assistance Recommended at Discharge PRN  Patient can return home with the following Assistance with cooking/housework    Functional Status Assessment  Patient has had a recent decline in their functional status and demonstrates the ability to make significant improvements in function in a reasonable and predictable amount of time.  Equipment Recommendations  None recommended by OT    Recommendations for Other Services PT consult     Precautions / Restrictions Precautions Precautions: None Restrictions Weight Bearing Restrictions: No      Mobility Bed Mobility Overal bed mobility:  Modified Independent             General bed mobility comments: no bedrail use    Transfers Overall transfer level: Needs assistance Equipment used: None Transfers: Sit to/from Stand Sit to Stand: Supervision           General transfer comment: supervision for line mgmt      Balance Overall balance assessment: Mild deficits observed, not formally tested                                         ADL either performed or assessed with clinical judgement   ADL Overall ADL's : Needs assistance/impaired Eating/Feeding: Modified independent   Grooming: Modified independent   Upper Body Bathing: Supervision/ safety   Lower Body Bathing: Supervison/ safety   Upper Body Dressing : Supervision/safety   Lower Body Dressing: Supervision/safety   Toilet Transfer: Supervision/safety;Ambulation;Regular Museum/gallery exhibitions officer and Hygiene: Supervision/safety       Functional mobility during ADLs: Supervision/safety       Vision Baseline Vision/History: 1 Wears glasses Patient Visual Report: No change from baseline Vision Assessment?: No apparent visual deficits Additional Comments: pt reports wearing contacts     Perception     Praxis      Pertinent Vitals/Pain Pain Assessment Pain Assessment: No/denies pain     Hand Dominance     Extremity/Trunk Assessment Upper Extremity Assessment Upper Extremity Assessment: Overall WFL for tasks assessed   Lower Extremity Assessment Lower Extremity Assessment: Overall WFL for tasks assessed   Cervical / Trunk  Assessment Cervical / Trunk Assessment: Normal   Communication Communication Communication: No difficulties   Cognition Arousal/Alertness: Awake/alert Behavior During Therapy: WFL for tasks assessed/performed Overall Cognitive Status: Within Functional Limits for tasks assessed                                       General Comments  VSS on RA     Exercises     Shoulder Instructions      Home Living Family/patient expects to be discharged to:: Private residence Living Arrangements: Spouse/significant other;Children Available Help at Discharge: Family;Available 24 hours/day Type of Home: House Home Access: Stairs to enter [employer] of Steps: 1   Home Layout: One level     Bathroom Shower/Tub: Teacher, early years/pre: Standard Bathroom Accessibility: Yes   Home Equipment: Conservation officer, nature (2 wheels);Grab bars - tub/shower   Additional Comments: son and granson that live within a few miles from pt      Prior Functioning/Environment Prior Level of Function : Independent/Modified Independent;Driving             Mobility Comments: no AD use ADLs Comments: does IADLs; enjoys dancing        OT Problem List: Decreased strength;Decreased activity tolerance      OT Treatment/Interventions:      OT Goals(Current goals can be found in the care plan section) Acute Rehab OT Goals Patient Stated Goal: to get better OT Goal Formulation: With patient Time For Goal Achievement: 10/10/21 Potential to Achieve Goals: Good  OT Frequency:      Co-evaluation              AM-PAC OT "6 Clicks" Daily Activity     Outcome Measure Help from another person eating meals?: None Help from another person taking care of personal grooming?: None Help from another person toileting, which includes using toliet, bedpan, or urinal?: None Help from another person bathing (including washing, rinsing, drying)?: None Help from another person to put on and taking off regular upper body clothing?: None Help from another person to put on and taking off regular lower body clothing?: None 6 Click Score: 24   End of Session Nurse Communication: Mobility status  Activity Tolerance: Patient tolerated treatment well Patient left: in bed;with call bell/phone within reach  OT Visit Diagnosis: Muscle weakness  (generalized) (M62.81)                Time: 5188-4166 OT Time Calculation (min): 17 min Charges:  OT General Charges $OT Visit: 1 Visit OT Evaluation $OT Eval Low Complexity: 1 Low  Lynnda Child, OTD, OTR/L Acute Rehab [phone] [place] 09/26/2021, 4:23 PM

## 2021-09-26 NOTE — Care Management Obs Status (Signed)
[place] NOTIFICATION   Patient Details  Name: Stacey Reyes MRN: [id number] Date of Birth: 1945-07-29   Medicare Observation Status Notification Given:  Yes    Pollie Friar, RN 09/26/2021, 2:31 PM

## 2021-09-26 NOTE — Anesthesia Postprocedure Evaluation (Signed)
Anesthesia Post Note  Patient: Stacey Reyes  Procedure(s) Performed: ENTEROSCOPY SUBMUCOSAL TATTOO INJECTION GIVENS CAPSULE STUDY     Patient location during evaluation: PACU Anesthesia Type: MAC Level of consciousness: awake and alert Pain management: pain level controlled Vital Signs Assessment: post-procedure vital signs reviewed and stable Respiratory status: spontaneous breathing, nonlabored ventilation, respiratory function stable and patient connected to nasal cannula oxygen Cardiovascular status: stable and blood pressure returned to baseline Postop Assessment: no apparent nausea or vomiting Anesthetic complications: no   No notable events documented.  Last Vitals:  Vitals:   09/26/21 1000 09/26/21 1155  BP: (!) 111/58 107/61  Pulse: 68 66  Resp: 17 18  Temp: (P) 37.2 C 36.8 C  SpO2: 98% 95%    Last Pain:  Vitals:   09/26/21 1155  TempSrc: Oral  PainSc:                  Tiajuana Amass

## 2021-09-26 NOTE — Op Note (Signed)
[facility] Patient Name: Stacey Reyes Procedure Date : 09/26/2021 MRN: [id number] Attending MD: Gatha Mayer , MD Date of Birth: [birth year] CSN: [id number] Age: 76 Admit Type: Inpatient Procedure:                Small bowel enteroscopy Indications:              Obscure gastrointestinal bleeding Providers:                Gatha Mayer, MD, Jaci Carrel, RN, William Dalton, Technician, Darliss Cheney, Technician Referring MD:              Medicines:                Monitored Anesthesia Care Complications:            No immediate complications. Estimated Blood Loss:     Estimated blood loss: none. Procedure:                Pre-Anesthesia Assessment:                           - Prior to the procedure, a History and Physical                            was performed, and patient medications and                            allergies were reviewed. The patient's tolerance of                            previous anesthesia was also reviewed. The risks                            and benefits of the procedure and the sedation                            options and risks were discussed with the patient.                            All questions were answered, and informed consent                            was obtained. Prior Anticoagulants: The patient has                            taken no previous anticoagulant or antiplatelet                            agents. ASA Grade Assessment: IV - A patient with                            severe systemic disease that is a constant threat  to life. After reviewing the risks and benefits,                            the patient was deemed in satisfactory condition to                            undergo the procedure.                           After obtaining informed consent, the endoscope was                            passed under direct vision. Throughout the                             procedure, the patient's blood pressure, pulse, and                            oxygen saturations were monitored continuously. The                            [id number]-H190 ([id number]) Olympus endoscope was introduced                            through the mouth and advanced to the duodenum. The                            [id number]-H190TL ([id number]) Olympus slim colonoscope was                            introduced through the and advanced to the proximal                            jejunum. The small bowel enteroscopy was                            accomplished without difficulty. The patient                            tolerated the procedure well. Scope In: Scope Out: Findings:      Multiple diminutive sessile polyps with no stigmata of recent bleeding       were found in the cardia, in the gastric fundus and in the gastric body.       Retroflexion otherwise normal      There was no evidence of significant pathology in the entire examined       duodenum.      There was no evidence of significant pathology in the proximal jejunum.       Area was tattooed with an injection of 4 mL of Spot (carbon black).       Estimated blood loss: none.      The examined esophagus was normal. Impression:               - Multiple gastric polyps. Known benign fundic  gland polyps                           - Normal examined duodenum. Slight scope trauma                            seen in bulb.                           - The examined portion of the jejunum was normal.                            Tattooed.                           - Normal esophagus.                           - No specimens collected. CAPSULE ENDOSCOPE PLACED                            INTO BULB VIA GASTROSCOPE AT END OF PROCEDURE Recommendation:           - F/U capsule findings - no bleeding source seen                            here                           Given bleeding issues I am not in favor of ASA at                             this time Procedure Code(s):        --- Professional ---                           [phone], Small intestinal endoscopy, enteroscopy                            beyond second portion of duodenum, not including                            ileum; diagnostic, including collection of                            specimen(s) by brushing or washing, when performed                            (separate procedure)                           44799, Unlisted procedure, small intestine Diagnosis Code(s):        --- Professional ---                           K31.7, Polyp of stomach and duodenum  K92.2, Gastrointestinal hemorrhage, unspecified CPT copyright 2019 American Medical Association. All rights reserved. The codes documented in this report are preliminary and upon coder review may  be revised to meet current compliance requirements. Gatha Mayer, MD 09/26/2021 9:55:11 AM This report has been signed electronically. Number of Addenda: 0

## 2021-09-26 NOTE — Progress Notes (Signed)
OT Cancellation Note  Patient Details Name: Stacey Reyes MRN: [id number] DOB: 20-Aug-1945   Cancelled Treatment:    Reason Eval/Treat Not Completed: Patient at procedure or test/ unavailable. Will follow and see as able.   Jolaine Artist, OT Acute Rehabilitation Services Office [phone]   Delight Stare 09/26/2021, 8:28 AM

## 2021-09-26 NOTE — Progress Notes (Signed)
EEG complete - results pending 

## 2021-09-27 DIAGNOSIS — E782 Mixed hyperlipidemia: Secondary | ICD-10-CM | POA: Diagnosis not present

## 2021-09-27 DIAGNOSIS — R2990 Unspecified symptoms and signs involving the nervous system: Secondary | ICD-10-CM

## 2021-09-27 DIAGNOSIS — K317 Polyp of stomach and duodenum: Secondary | ICD-10-CM | POA: Diagnosis not present

## 2021-09-27 DIAGNOSIS — Z8719 Personal history of other diseases of the digestive system: Secondary | ICD-10-CM | POA: Diagnosis not present

## 2021-09-27 DIAGNOSIS — K922 Gastrointestinal hemorrhage, unspecified: Secondary | ICD-10-CM | POA: Diagnosis not present

## 2021-09-27 DIAGNOSIS — D62 Acute posthemorrhagic anemia: Secondary | ICD-10-CM | POA: Diagnosis not present

## 2021-09-27 DIAGNOSIS — I10 Essential (primary) hypertension: Secondary | ICD-10-CM | POA: Diagnosis not present

## 2021-09-27 LAB — HEMOGLOBIN AND HEMATOCRIT, BLOOD
HCT: 24.1 % — ABNORMAL LOW (ref 36.0–46.0)
Hemoglobin: 7.9 g/dL — ABNORMAL LOW (ref 12.0–15.0)

## 2021-09-27 MED ORDER — FERROUS SULFATE 325 (65 FE) MG PO TABS: 325.0000 mg | ORAL_TABLET | Freq: Every day | ORAL | 3 refills | Status: DC

## 2021-09-27 NOTE — Progress Notes (Addendum)
   Patient Name: Stacey Reyes Date of Encounter: 09/27/2021, 3:42 PM    Subjective  No complaints No more neuro sxs No further bleeding signs    Objective  BP 119/66 (BP Location: Left Arm)   Pulse 78   Temp 98.3 F (36.8 C) (Oral)   Resp 18   Ht '5\' 6"'$  (1.676 m)   Wt 91.3 kg   SpO2 97%   BMI 32.49 kg/m      Latest Ref Rng & Units 09/27/2021    5:19 AM 09/26/2021    6:01 PM 09/26/2021    5:48 AM  CBC  Hemoglobin 12.0 - 15.0 g/dL 7.9  7.8  7.6   Hematocrit 36.0 - 46.0 % 24.1  24.6  22.2     Capsule endoscopy test was negative - no lesions toexplain bleeding  Assessment and Plan  Upper GI bleed - unclear etiology - in past has had duodenal bleeding ? [place]  OK to dc Would not use ASA given unclear etiology of transient speech changes and bleeding problems Daily PPI at dc FeSO4 qd also  She plans to f/u w/PCP - does not need in person GI f/u as we will observe for recurrence rather than follow longitudinally PCP can cc me hgb results  Gatha Mayer, MD, [facility] See Shea Evans on call - gastroenterology for best contact person 09/27/2021 3:42 PM

## 2021-09-27 NOTE — Discharge Summary (Signed)
Physician Discharge Summary   Patient: Stacey Reyes MRN: [id number] DOB: [birth year]  Admit date:     09/25/2021  Discharge date: 09/27/21  Discharge Physician: Patrecia Pour   PCP: Stacey Norlander, DO   Recommendations at discharge:  Follow up with PCP's office next week for repeat CBC. Discharged on PPI and iron supplement for transfusion-dependent anemia due to recurrent, cryptogenic GI bleeds with no source found by EGD or capsule endoscopy this admission.  Discharge Diagnoses: Principal Problem:   Upper GI bleed Active Problems:   Acute blood loss anemia   Hypertension, essential   Hyperlipidemia   History of GI bleed  Hospital Course: Stacey Reyes is a 76 y.o. female with a history of HTN, HLD, anemia, GI bleeding who presented to the ED 7/4 with melanotic stools and worsening anemia (hgb 6.8g/dl), after having been admitted 6/25-6/28 for GI bleeding. EGD at that previous admission revealed blood in the duodenum without active bleeding source located, suspected Dieulafoy lesion leading to her presentation, discharged on PPI. She had also been admitted [facility] 2022 when she underwent empiric coil embolization of the GDA and a hypertrophied pancreaticoduodenal artery with IR on 12/05/2020.  The EGD on 12/11/2020 noted blood in duodenal bulb and second portion, one bleeding AVM s/p epi, but unsuccessful, s/p clips X 3 and hemospray.  Patient underwent coil embolization of inferior pancreaticoduodenal artery from SMA and exoseal for hemostasis with IR on 12/13/2020.   In the ED, hgb was 6.2g/dl for which 2u PRBCs were given and GI consulted. During transfusion the patient developed about 15 minutes of transient word blocking, expressive aphasia for which neurology was consulted CT head, CTA head and neck, and MR brain have been unrevealing and symptoms abated spontaneously.    EGD 7/5 demonstrated multiple gastric polyps, normal small intestine to jejunum. VCE placed at that time. GI  advised against antiplatelet given her recurrent GI bleeding episodes.  Assessment and Plan: Acute blood loss anemia secondary to recurrent cryptogenic upper GI bleed:  - Hgb improved with 2u PRBCs 6.2 >> 7.9g/dl and stable overnight with no further bleeding. Cleared by GI for discharge with return precautions.   - No bleeding source on EGD, and no blood noted either (has been seen in the past, suspected Dieulafoy lesion). Capsule endoscopy reportedly negative.  - Continuing PPI as previously directed. - Start oral iron. Note this may muddy the waters Re: dark stools vs. true melena.   Transient expressive aphasia/word blocking: Without other neurological signs or symptoms, spontaneously resolved, neuroimaging negative.  - Neurology consulted, d/w Dr. Leonie Man. Would otherwise start antiplatelet, though risk of bleeding in this patient is prohibitive in the setting of such reassuring neurological work up.    HTN: Note severe LVH on echo, though LVEF preserved.  - Continue home HCTZ, bisoprolol.   HLD:  - Continue statin   Obesity: Body mass index is 32.49 kg/Reyes.   Consultants: GI, Dr. Carlean Purl; Neurology, Dr. Leonie Man Procedures performed: EGD 09/26/2021 Dr. Carlean Purl; Video capsule endoscopy 7/5-7/6.  Disposition: Home Diet recommendation:  Cardiac diet DISCHARGE MEDICATION: Allergies as of 09/27/2021       Reactions   Sulfa Antibiotics Rash        Medication List     TAKE these medications    bisoprolol 5 MG tablet Commonly known as: ZEBETA TAKE ONE-HALF TABLET BY  MOUTH DAILY   CALCIUM PO Take 1 tablet by mouth daily.   ferrous sulfate 325 (65 FE) MG tablet Take 1 tablet (325  mg total) by mouth daily.   hydrochlorothiazide 12.5 MG capsule Commonly known as: MICROZIDE Take 1 capsule (12.5 mg total) by mouth daily. What changed: when to take this   levocetirizine 5 MG tablet Commonly known as: XYZAL TAKE 1 TABLET (5 MG TOTAL) BY MOUTH AT BEDTIME AS NEEDED FOR ALLERGIES  (DRAINAGE).   MAGNESIUM PO Take 1 tablet by mouth daily.   pantoprazole 40 MG tablet Commonly known as: PROTONIX Take 1 tablet (40 mg total) by mouth 2 (two) times daily.   rosuvastatin 10 MG tablet Commonly known as: CRESTOR Take 1 tablet (10 mg total) by mouth at bedtime.   traZODone 50 MG tablet Commonly known as: DESYREL Take 1 tablet (50 mg total) by mouth at bedtime as needed for sleep.   VITAMIN C PO Take 1 tablet by mouth daily.   VITAMIN D-3 PO Take 1 capsule by mouth daily.   VITAMIN E PO Take 1 tablet by mouth daily.        Follow-up Information     Stacey Doss M, DO Follow up.   Specialty: Family Medicine Contact information: [address] [place] [address] [phone]                Discharge Exam: Stacey Reyes Weights   09/25/21 0908 09/25/21 1413 09/26/21 0936  Weight: 93.4 kg 91.3 kg 91.3 kg  BP 119/66 (BP Location: Left Arm)   Pulse 78   Temp 98.3 F (36.8 C) (Oral)   Resp 18   Ht '5\' 6"'$  (1.676 Reyes)   Wt 91.3 kg   SpO2 97%   BMI 32.49 kg/Reyes   No distress, pleasant, normal speech and cognition Clear, nonlabored RRR, no edema Soft, NT, ND +BS  Condition at discharge: stable  The results of significant diagnostics from this hospitalization (including imaging, microbiology, ancillary and laboratory) are listed below for reference.   Imaging Studies: EEG adult  Result Date: 2021-10-06 Stacey Havens, MD     10/06/2021  8:41 AM Patient Name: Stacey Reyes MRN: [id number] Epilepsy Attending: Lora Reyes Referring Physician/Provider: Janine Ores, NP Date: 09/26/2021 Duration: 25.01 mins Patient history: 76 year old female with transient word finding difficulty.  EEG to evaluate for seizure. Level of alertness: Awake AEDs during EEG study: None Technical aspects: This EEG study was done with scalp electrodes positioned according to the 10-20 International system of electrode placement. Electrical activity was acquired at a  sampling rate of '500Hz'$  and reviewed with a high frequency filter of '70Hz'$  and a low frequency filter of '1Hz'$ . EEG data were recorded continuously and digitally stored. Description: The posterior dominant rhythm consists of 9-10 Hz activity of moderate voltage (25-35 uV) seen predominantly in posterior head regions, symmetric and reactive to eye opening and eye closing.  Hyperventilation and photic stimulation were not performed.   IMPRESSION: This study is within normal limits. No seizures or epileptiform discharges were seen throughout the recording. Stacey Reyes   ECHOCARDIOGRAM COMPLETE  Result Date: 09/26/2021    ECHOCARDIOGRAM REPORT   Patient Name:   BERKELEY VELDMAN Date of Exam: 09/26/2021 Medical Rec #:  [id number]         Height:       66.0 in Accession #:    [id number]        Weight:       201.3 lb Date of Birth:  1945-03-26         BSA:          2.005 Reyes Patient  Age:    42 years          BP:           126/69 mmHg Patient Gender: F                 HR:           64 bpm. Exam Location:  Inpatient Procedure: 2D Echo, Cardiac Doppler and Color Doppler Indications:    TIA  History:        Patient has prior history of Echocardiogram examinations. Risk                 Factors:Hypertension.  Sonographer:    Jyl Heinz Referring Phys: [place]  1. Left ventricular ejection fraction, by estimation, is 60 to 65%. The left ventricle has normal function. The left ventricle has no regional wall motion abnormalities. There is severe concentric left ventricular hypertrophy. Left ventricular diastolic  parameters are consistent with Grade I diastolic dysfunction (impaired relaxation).  2. Right ventricular systolic function is normal. The right ventricular size is normal.  3. Left atrial size was severely dilated.  4. The mitral valve is normal in structure. Trivial mitral valve regurgitation. No evidence of mitral stenosis.  5. The aortic valve is normal in structure. Aortic valve  regurgitation is not visualized. No aortic stenosis is present.  6. The inferior vena cava is normal in size with greater than 50% respiratory variability, suggesting right atrial pressure of 3 mmHg. FINDINGS  Left Ventricle: Left ventricular ejection fraction, by estimation, is 60 to 65%. The left ventricle has normal function. The left ventricle has no regional wall motion abnormalities. The left ventricular internal cavity size was normal in size. There is  severe concentric left ventricular hypertrophy. Left ventricular diastolic parameters are consistent with Grade I diastolic dysfunction (impaired relaxation). Right Ventricle: The right ventricular size is normal. No increase in right ventricular wall thickness. Right ventricular systolic function is normal. Left Atrium: Left atrial size was severely dilated. Right Atrium: Right atrial size was normal in size. Pericardium: There is no evidence of pericardial effusion. Presence of epicardial fat layer. Mitral Valve: The mitral valve is normal in structure. Mild mitral annular calcification. Trivial mitral valve regurgitation. No evidence of mitral valve stenosis. Tricuspid Valve: The tricuspid valve is normal in structure. Tricuspid valve regurgitation is trivial. No evidence of tricuspid stenosis. Aortic Valve: The aortic valve is normal in structure. Aortic valve regurgitation is not visualized. No aortic stenosis is present. Aortic valve mean gradient measures 14.0 mmHg. Aortic valve peak gradient measures 23.7 mmHg. Pulmonic Valve: The pulmonic valve was normal in structure. Pulmonic valve regurgitation is not visualized. No evidence of pulmonic stenosis. Aorta: The aortic root is normal in size and structure. Venous: The inferior vena cava is normal in size with greater than 50% respiratory variability, suggesting right atrial pressure of 3 mmHg. IAS/Shunts: No atrial level shunt detected by color flow Doppler.  LEFT VENTRICLE PLAX 2D LVIDd:         3.90  cm      Diastology LVIDs:         2.20 cm      LV e' medial:    5.45 cm/s LV PW:         1.70 cm      LV E/e' medial:  16.8 LV IVS:        1.60 cm      LV e' lateral:   3.94 cm/s LVOT diam:  2.00 cm      LV E/e' lateral: 23.3 LVOT Area:     3.14 cm  LV Volumes (MOD) LV vol d, MOD A2C: 135.0 ml LV vol d, MOD A4C: 135.0 ml LV vol s, MOD A2C: 46.5 ml LV vol s, MOD A4C: 49.6 ml LV SV MOD A2C:     88.5 ml LV SV MOD A4C:     135.0 ml LV SV MOD BP:      86.2 ml RIGHT VENTRICLE             IVC RV Basal diam:  3.40 cm     IVC diam: 1.90 cm RV Mid diam:    3.00 cm RV S prime:     17.30 cm/s TAPSE (Reyes-mode): 2.8 cm LEFT ATRIUM             Index        RIGHT ATRIUM           Index LA diam:        3.70 cm 1.85 cm/Reyes   RA Area:     17.60 cm LA Vol (A2C):   94.1 ml 46.93 ml/Reyes  RA Volume:   42.70 ml  21.29 ml/Reyes LA Vol (A4C):   98.9 ml 49.32 ml/Reyes LA Biplane Vol: 96.7 ml 48.22 ml/Reyes  AORTIC VALVE AV Vmax:      243.33 cm/s AV Vmean:     183.667 cm/s AV VTI:       0.579 Reyes AV Peak Grad: 23.7 mmHg AV Mean Grad: 14.0 mmHg  AORTA Ao Root diam: 3.40 cm Ao Asc diam:  3.20 cm MITRAL VALVE                TRICUSPID VALVE MV Area (PHT): 3.70 cm     TR Peak grad:   16.6 mmHg MV Decel Time: 205 msec     TR Vmax:        204.00 cm/s MV E velocity: 91.80 cm/s MV A velocity: 121.00 cm/s  SHUNTS MV E/A ratio:  0.76         Systemic Diam: 2.00 cm Kardie Tobb DO Electronically signed by Berniece Salines DO Signature Date/Time: 09/26/2021/2:51:11 PM    Final    MR BRAIN WO CONTRAST  Result Date: 09/25/2021 CLINICAL DATA:  TIA EXAM: MRI HEAD WITHOUT CONTRAST TECHNIQUE: Multiplanar, multiecho pulse sequences of the brain and surrounding structures were obtained without intravenous contrast. COMPARISON:  Same-day CTA head/neck FINDINGS: Brain: There is no acute intracranial hemorrhage, extra-axial fluid collection, or acute infarct. Parenchymal volume is normal for age. The ventricles are normal in size. Gray-white differentiation is preserved. There are  a few tiny foci of FLAIR signal abnormality in the subcortical and periventricular white matter likely reflecting minimal chronic white matter microangiopathy. There is no suspicious parenchymal signal abnormality. There is no mass lesion. There is no mass effect or midline shift. Vascular: Normal flow voids. Skull and upper cervical spine: Normal marrow signal. Sinuses/Orbits: The paranasal sinuses are clear. Bilateral lens implants are in place. The globes and orbits are otherwise unremarkable. Other: None. IMPRESSION: Normal for age brain MRI with no acute intracranial pathology. Electronically Signed   By: Valetta Mole Reyes.D.   On: 09/25/2021 19:46   CT ANGIO HEAD NECK W WO CM  Result Date: 09/25/2021 CLINICAL DATA:  Acute neurologic deficit EXAM: CT ANGIOGRAPHY HEAD AND NECK TECHNIQUE: Multidetector CT imaging of the head and neck was performed using the standard protocol during bolus administration of intravenous contrast. Multiplanar  CT image reconstructions and MIPs were obtained to evaluate the vascular anatomy. Carotid stenosis measurements (when applicable) are obtained utilizing NASCET criteria, using the distal internal carotid diameter as the denominator. RADIATION DOSE REDUCTION: This exam was performed according to the departmental dose-optimization program which includes automated exposure control, adjustment of the mA and/or kV according to patient size and/or use of iterative reconstruction technique. CONTRAST:  36m OMNIPAQUE IOHEXOL 350 MG/ML SOLN COMPARISON:  None Available. FINDINGS: CT HEAD FINDINGS Brain: There is no mass, hemorrhage or extra-axial collection. The size and configuration of the ventricles and extra-axial CSF spaces are normal. There is no acute or chronic infarction. The brain parenchyma is normal. Skull: The visualized skull base, calvarium and extracranial soft tissues are normal. Sinuses/Orbits: No fluid levels or advanced mucosal thickening of the visualized paranasal  sinuses. No mastoid or middle ear effusion. The orbits are normal. CTA NECK FINDINGS SKELETON: There is no bony spinal canal stenosis. No lytic or blastic lesion. OTHER NECK: Normal pharynx, larynx and major salivary glands. No cervical lymphadenopathy. Unremarkable thyroid gland. UPPER CHEST: No pneumothorax or pleural effusion. No nodules or masses. AORTIC ARCH: There is calcific atherosclerosis of the aortic arch. There is no aneurysm, dissection or hemodynamically significant stenosis of the visualized portion of the aorta. Conventional 3 vessel aortic branching pattern. The visualized proximal subclavian arteries are widely patent. RIGHT CAROTID SYSTEM: Normal without aneurysm, dissection or stenosis. LEFT CAROTID SYSTEM: Normal without aneurysm, dissection or stenosis. VERTEBRAL ARTERIES: Left dominant configuration. Both origins are clearly patent. There is no dissection, occlusion or flow-limiting stenosis to the skull base (V1-V3 segments). CTA HEAD FINDINGS POSTERIOR CIRCULATION: --Vertebral arteries: Normal V4 segments. --Inferior cerebellar arteries: Normal. --Basilar artery: Normal. --Superior cerebellar arteries: Normal. --Posterior cerebral arteries (PCA): Normal. ANTERIOR CIRCULATION: --Intracranial internal carotid arteries: Normal. --Anterior cerebral arteries (ACA): Normal. Both A1 segments are present. Patent anterior communicating artery (a-comm). --Middle cerebral arteries (MCA): Normal. VENOUS SINUSES: As permitted by contrast timing, patent. ANATOMIC VARIANTS: None Review of the MIP images confirms the above findings. IMPRESSION: 1. No emergent large vessel occlusion or high-grade stenosis of the intracranial or cervical arteries. 2. Aortic Atherosclerosis (ICD10-I70.0). Electronically Signed   By: KUlyses JarredM.D.   On: 09/25/2021 19:25   DG Chest Port 1 View  Result Date: 09/16/2021 CLINICAL DATA:  Shortness of breath for 2 days EXAM: PORTABLE CHEST 1 VIEW COMPARISON:  12/11/2026  FINDINGS: Cardiac shadow is within normal limits. Aortic calcifications are seen. Lungs are clear bilaterally. Mild chronic interstitial changes are noted. No acute bony abnormality is seen. IMPRESSION: No active disease. Electronically Signed   By: MInez CatalinaM.D.   On: 09/16/2021 19:05    Microbiology: Results for orders placed or performed during the hospital encounter of 12/04/20  SARS CORONAVIRUS 2 (TAT 6-24 HRS) Nasopharyngeal Nasopharyngeal Swab     Status: None   Collection Time: 12/04/20  2:30 PM   Specimen: Nasopharyngeal Swab  Result Value Ref Range Status   SARS Coronavirus 2 NEGATIVE NEGATIVE Final    Comment: (NOTE) SARS-CoV-2 target nucleic acids are NOT DETECTED.  The SARS-CoV-2 RNA is generally detectable in upper and lower respiratory specimens during the acute phase of infection. Negative results do not preclude SARS-CoV-2 infection, do not rule out co-infections with other pathogens, and should not be used as the sole basis for treatment or other patient management decisions. Negative results must be combined with clinical observations, patient history, and epidemiological information. The expected result is Negative.  Fact Sheet for Patients:  SugarRoll.be  Fact Sheet for Healthcare Providers: https://www.woods-mathews.com/  This test is not yet approved or cleared by the [place] FDA and  has been authorized for detection and/or diagnosis of SARS-CoV-2 by FDA under an Emergency Use Authorization (EUA). This EUA will remain  in effect (meaning this test can be used) for the duration of the COVID-19 declaration under Se ction 564(b)(1) of the Act, 21 U.S.C. section 360bbb-3(b)(1), unless the authorization is terminated or revoked sooner.  Performed at [facility], [place] [address], [place], [place] [address]   MRSA Next Gen by PCR, Nasal     Status: None   Collection Time: 12/05/20  8:37 PM   Specimen:  Nasal Mucosa; Nasal Swab  Result Value Ref Range Status   MRSA by PCR Next Gen NOT DETECTED NOT DETECTED Final    Comment: (NOTE) The GeneXpert MRSA Assay (FDA approved for NASAL specimens only), is one component of a comprehensive MRSA colonization surveillance program. It is not intended to diagnose MRSA infection nor to guide or monitor treatment for MRSA infections. Test performance is not FDA approved in patients less than 61 years old. Performed at [facility], [address], [place], [place] [address]     Labs: CBC: Recent Labs  Lab 09/24/21 [id number] 09/25/21 0918 09/25/21 0918 09/25/21 2124 09/26/21 0043 09/26/21 0548 09/26/21 1801 09/27/21 0519  WBC 5.3 5.4  --   --   --   --   --   --   NEUTROABS 2.8  --   --   --   --   --   --   --   HGB 6.8* 6.2*   < > 7.7* 7.8* 7.6* 7.8* 7.9*  HCT 21.8* 20.3*   < > 22.9* 23.3* 22.2* 24.6* 24.1*  MCV 93 94.9  --   --   --   --   --   --   PLT 228 205  --   --   --   --   --   --    < > = values in this interval not displayed.   Basic Metabolic Panel: Recent Labs  Lab 09/25/21 0918 09/26/21 0548  NA 140 139  K 3.8 3.5  CL 103 105  CO2 24 25  GLUCOSE 110* 96  BUN 18 12  CREATININE 0.80 0.74  CALCIUM 9.2 8.5*   Liver Function Tests: Recent Labs  Lab 09/25/21 0918  AST 29  ALT 14  ALKPHOS 20*  BILITOT 0.6  PROT 5.5*  ALBUMIN 3.4*   CBG: Recent Labs  Lab 09/25/21 1701  GLUCAP 90    Discharge time spent: greater than 30 minutes.  Signed: Patrecia Pour, MD [facility] 09/27/2021

## 2021-09-27 NOTE — Progress Notes (Signed)
STROKE TEAM PROGRESS NOTE   INTERVAL HISTORY No family at the bedside. She had an enteroscopy capsule study yesterday and results are pending. She is sitting up comfortably on the side of the bed. Neuro exam is non focal. She is hemodynamically stable. Hct stable at 24.1 EEG study was normal Vitals:   09/26/21 2000 09/27/21 0000 09/27/21 0400 09/27/21 0756  BP: 129/65 127/64 120/65 120/67  Pulse: 82 72 71 70  Resp:    14  Temp: 98 F (36.7 C) 98 F (36.7 C) 98.4 F (36.9 C) 98 F (36.7 C)  TempSrc: Oral Oral Oral Oral  SpO2: 99% 99% 96% 97%  Weight:      Height:       CBC:  Recent Labs  Lab 09/24/21 0839 09/25/21 0918 09/25/21 2124 09/26/21 1801 09/27/21 0519  WBC 5.3 5.4  --   --   --   NEUTROABS 2.8  --   --   --   --   HGB 6.8* 6.2*   < > 7.8* 7.9*  HCT 21.8* 20.3*   < > 24.6* 24.1*  MCV 93 94.9  --   --   --   PLT 228 205  --   --   --    < > = values in this interval not displayed.   Basic Metabolic Panel:  Recent Labs  Lab 09/25/21 0918 09/26/21 0548  NA 140 139  K 3.8 3.5  CL 103 105  CO2 24 25  GLUCOSE 110* 96  BUN 18 12  CREATININE 0.80 0.74  CALCIUM 9.2 8.5*   Lipid Panel:  Recent Labs  Lab 09/26/21 0043  CHOL 99  TRIG 130  HDL 28*  CHOLHDL 3.5  VLDL 26  LDLCALC 45   HgbA1c:  Recent Labs  Lab 09/26/21 0043  HGBA1C 5.1   Urine Drug Screen: No results for input(s): "LABOPIA", "COCAINSCRNUR", "LABBENZ", "AMPHETMU", "THCU", "LABBARB" in the last 168 hours.  Alcohol Level No results for input(s): "ETH" in the last 168 hours.  IMAGING past 24 hours EEG adult  Result Date: 09/27/2021 Lora Havens, MD     09/27/2021  8:41 AM Patient Name: Stacey Reyes MRN: [id number] Epilepsy Attending: Lora Havens Referring Physician/Provider: Janine Ores, NP Date: 09/26/2021 Duration: 25.01 mins Patient history: 76 year old female with transient word finding difficulty.  EEG to evaluate for seizure. Level of alertness: Awake AEDs during EEG  study: None Technical aspects: This EEG study was done with scalp electrodes positioned according to the 10-20 International system of electrode placement. Electrical activity was acquired at a sampling rate of '500Hz'$  and reviewed with a high frequency filter of '70Hz'$  and a low frequency filter of '1Hz'$ . EEG data were recorded continuously and digitally stored. Description: The posterior dominant rhythm consists of 9-10 Hz activity of moderate voltage (25-35 uV) seen predominantly in posterior head regions, symmetric and reactive to eye opening and eye closing.  Hyperventilation and photic stimulation were not performed.   IMPRESSION: This study is within normal limits. No seizures or epileptiform discharges were seen throughout the recording. Lora Havens   ECHOCARDIOGRAM COMPLETE  Result Date: 09/26/2021    ECHOCARDIOGRAM REPORT   Patient Name:   Stacey Reyes Date of Exam: 09/26/2021 Medical Rec #:  [id number]         Height:       66.0 in Accession #:    [id number]        Weight:       201.3  lb Date of Birth:  [birth year]         BSA:          2.005 m Patient Age:    60 years          BP:           126/69 mmHg Patient Gender: F                 HR:           64 bpm. Exam Location:  Inpatient Procedure: 2D Echo, Cardiac Doppler and Color Doppler Indications:    TIA  History:        Patient has prior history of Echocardiogram examinations. Risk                 Factors:Hypertension.  Sonographer:    Jyl Heinz Referring Phys: [place]  1. Left ventricular ejection fraction, by estimation, is 60 to 65%. The left ventricle has normal function. The left ventricle has no regional wall motion abnormalities. There is severe concentric left ventricular hypertrophy. Left ventricular diastolic  parameters are consistent with Grade I diastolic dysfunction (impaired relaxation).  2. Right ventricular systolic function is normal. The right ventricular size is normal.  3. Left atrial size was  severely dilated.  4. The mitral valve is normal in structure. Trivial mitral valve regurgitation. No evidence of mitral stenosis.  5. The aortic valve is normal in structure. Aortic valve regurgitation is not visualized. No aortic stenosis is present.  6. The inferior vena cava is normal in size with greater than 50% respiratory variability, suggesting right atrial pressure of 3 mmHg. FINDINGS  Left Ventricle: Left ventricular ejection fraction, by estimation, is 60 to 65%. The left ventricle has normal function. The left ventricle has no regional wall motion abnormalities. The left ventricular internal cavity size was normal in size. There is  severe concentric left ventricular hypertrophy. Left ventricular diastolic parameters are consistent with Grade I diastolic dysfunction (impaired relaxation). Right Ventricle: The right ventricular size is normal. No increase in right ventricular wall thickness. Right ventricular systolic function is normal. Left Atrium: Left atrial size was severely dilated. Right Atrium: Right atrial size was normal in size. Pericardium: There is no evidence of pericardial effusion. Presence of epicardial fat layer. Mitral Valve: The mitral valve is normal in structure. Mild mitral annular calcification. Trivial mitral valve regurgitation. No evidence of mitral valve stenosis. Tricuspid Valve: The tricuspid valve is normal in structure. Tricuspid valve regurgitation is trivial. No evidence of tricuspid stenosis. Aortic Valve: The aortic valve is normal in structure. Aortic valve regurgitation is not visualized. No aortic stenosis is present. Aortic valve mean gradient measures 14.0 mmHg. Aortic valve peak gradient measures 23.7 mmHg. Pulmonic Valve: The pulmonic valve was normal in structure. Pulmonic valve regurgitation is not visualized. No evidence of pulmonic stenosis. Aorta: The aortic root is normal in size and structure. Venous: The inferior vena cava is normal in size with greater  than 50% respiratory variability, suggesting right atrial pressure of 3 mmHg. IAS/Shunts: No atrial level shunt detected by color flow Doppler.  LEFT VENTRICLE PLAX 2D LVIDd:         3.90 cm      Diastology LVIDs:         2.20 cm      LV e' medial:    5.45 cm/s LV PW:         1.70 cm      LV E/e' medial:  16.8 LV IVS:        1.60 cm      LV e' lateral:   3.94 cm/s LVOT diam:     2.00 cm      LV E/e' lateral: 23.3 LVOT Area:     3.14 cm  LV Volumes (MOD) LV vol d, MOD A2C: 135.0 ml LV vol d, MOD A4C: 135.0 ml LV vol s, MOD A2C: 46.5 ml LV vol s, MOD A4C: 49.6 ml LV SV MOD A2C:     88.5 ml LV SV MOD A4C:     135.0 ml LV SV MOD BP:      86.2 ml RIGHT VENTRICLE             IVC RV Basal diam:  3.40 cm     IVC diam: 1.90 cm RV Mid diam:    3.00 cm RV S prime:     17.30 cm/s TAPSE (M-mode): 2.8 cm LEFT ATRIUM             Index        RIGHT ATRIUM           Index LA diam:        3.70 cm 1.85 cm/m   RA Area:     17.60 cm LA Vol (A2C):   94.1 ml 46.93 ml/m  RA Volume:   42.70 ml  21.29 ml/m LA Vol (A4C):   98.9 ml 49.32 ml/m LA Biplane Vol: 96.7 ml 48.22 ml/m  AORTIC VALVE AV Vmax:      243.33 cm/s AV Vmean:     183.667 cm/s AV VTI:       0.579 m AV Peak Grad: 23.7 mmHg AV Mean Grad: 14.0 mmHg  AORTA Ao Root diam: 3.40 cm Ao Asc diam:  3.20 cm MITRAL VALVE                TRICUSPID VALVE MV Area (PHT): 3.70 cm     TR Peak grad:   16.6 mmHg MV Decel Time: 205 msec     TR Vmax:        204.00 cm/s MV E velocity: 91.80 cm/s MV A velocity: 121.00 cm/s  SHUNTS MV E/A ratio:  0.76         Systemic Diam: 2.00 cm Kardie Tobb DO Electronically signed by Berniece Salines DO Signature Date/Time: 09/26/2021/2:51:11 PM    Final     PHYSICAL EXAM  Physical Exam  Constitutional: Appears well-developed and well-nourished.  Pleasant elderly lady Cardiovascular: Normal rate and regular rhythm.  Respiratory: Effort normal, non-labored breathing  Neuro: Mental Status: Patient is awake, alert, oriented to person, place, month, year, and  situation. Patient is able to give a clear and coherent history. No signs of aphasia or neglect Cranial Nerves: II: Visual Fields are full. Pupils are equal, round, and reactive to light.   III,IV, VI: EOMI without ptosis or diploplia.  V: Facial sensation is symmetric to temperature VII: Facial movement is symmetric resting and smiling VIII: Hearing is intact to voice X: Palate elevates symmetrically XI: Shoulder shrug is symmetric. XII: Tongue protrudes midline without atrophy or fasciculations.  Motor: Tone is normal. Bulk is normal. 5/5 strength was present in all four extremities.  Sensory: Sensation is symmetric to light touch and temperature in the arms and legs. No extinction to DSS present.  Cerebellar: FNF and HKS are intact bilaterally    ASSESSMENT/PLAN Stacey Reyes is a 76 y.o. female with history of HTN, HLD, anemia who  is admitted for a GI bleed. During her intake she developed transient expressive aphasia lasting for approximately 15 minutes. Patient was aware of event and could understand all that was being said or asked. She could not find the right words. She received two units of PRBCs yesterday and is undergoing a capsule endoscopy today.   TIA like episode-transient word finding difficulties only CTA head & neck No emergent large vessel occlusion or high-grade stenosis of the intracranial or cervical arteries. MRI  Normal brain MRI 2D Echo EF 60-65% EEG normal  LDL 45 HgbA1c 5.1 VTE prophylaxis - SCDs    Diet   Diet regular Room service appropriate? Yes; Fluid consistency: Thin   No antithrombotic prior to admission, now on No antithrombotic. Given GI Bleeding and anemia Therapy recommendations:  pending Disposition:  pending  Hypertension Home meds:  zebeta, hydrochlorothiazide Stable Permissive hypertension (OK if < 220/120) but gradually normalize in 5-7 days Long-term BP goal normotensive  Hyperlipidemia Home meds:  Crestor '10mg'$ ,  resumed in hospital LDL 45, goal < 70 Continue statin at discharge  Other Stroke Risk Factors Advanced Age >/= 15   Other Active Problems GI Bleeding Capsule enteroscopy Transfuse to keep Hgb above 7.0  Hospital day # 0  Patient presented with GI bleed and developed transient word finding difficulties for a brief moment only without any other complaints stroke or TIA symptoms.  Unclear if this represents a true TIA versus mild cognitive impairment.  Hence recommend holding antiplatelet therapy due to GI bleed and work-up incomplete.  Await capsule endoscopy results to decide if it is safe to start baby aspirin or not.  Greater than 50% time during this 25-minute visit was spent in counseling and coordination of care about an episode of speech difficulties and discussion of differential diagnosis and evaluation treatment plan and answering questions.  Antony Contras, MD Medical Director [facility] Pager: [phone] 09/27/2021 11:56 AM   To contact Stroke Continuity provider, please refer to [email or web address] After hours, contact General Neurology

## 2021-09-27 NOTE — Procedures (Signed)
Patient Name: Stacey Reyes  MRN: [id number]  Epilepsy Attending: Lora Havens  Referring Physician/Provider: Janine Ores, NP Date: 09/26/2021 Duration: 25.01 mins  Patient history: 76 year old female with transient word finding difficulty.  EEG to evaluate for seizure.  Level of alertness: Awake  AEDs during EEG study: None  Technical aspects: This EEG study was done with scalp electrodes positioned according to the 10-20 International system of electrode placement. Electrical activity was acquired at a sampling rate of '500Hz'$  and reviewed with a high frequency filter of '70Hz'$  and a low frequency filter of '1Hz'$ . EEG data were recorded continuously and digitally stored.   Description: The posterior dominant rhythm consists of 9-10 Hz activity of moderate voltage (25-35 uV) seen predominantly in posterior head regions, symmetric and reactive to eye opening and eye closing.  Hyperventilation and photic stimulation were not performed.     IMPRESSION: This study is within normal limits. No seizures or epileptiform discharges were seen throughout the recording.  Blyss Lugar Barbra Sarks

## 2021-09-28 ENCOUNTER — Telehealth: Payer: Self-pay | Admitting: Family Medicine

## 2021-09-28 NOTE — Telephone Encounter (Signed)
Transition Care Management Follow-up Telephone Call Date of discharge and from where: 09/27/21 - [place] - upper GI bleed How have you been since you were released from the hospital? Doing much better Any questions or concerns? No  Items Reviewed: Did the pt receive and understand the discharge instructions provided? Yes  Medications obtained and verified? Yes  Other? No  Any new allergies since your discharge? No  Dietary orders reviewed? Yes Do you have support at home? Yes   Home Care and Equipment/Supplies: Were home health services ordered? no  Were any new equipment or medical supplies ordered?  No   Functional Questionnaire: (I = Independent and D = Dependent) ADLs: I  Bathing/Dressing- I  Meal Prep- I  Eating- I  Maintaining continence- I  Transferring/Ambulation- I  Managing Meds- I  Follow up appointments reviewed:  PCP Hospital f/u appt confirmed? Yes  Scheduled to see Evelina Dun (pt didn't want to wait to see [facility] on Friday) on 10/02/21 @ 9:50. [facility] f/u appt confirmed? Yes  Scheduled to see [facility]  on 10/09/21 @ 1:10. Are transportation arrangements needed? No  If their condition worsens, is the pt aware to call PCP or go to the Emergency Dept.? Yes Was the patient provided with contact information for the PCP's office or ED? Yes Was to pt encouraged to call back with questions or concerns? Yes

## 2021-10-02 ENCOUNTER — Encounter: Payer: Self-pay | Admitting: Family

## 2021-10-02 ENCOUNTER — Ambulatory Visit (INDEPENDENT_AMBULATORY_CARE_PROVIDER_SITE_OTHER): Payer: Medicare Other | Admitting: Family

## 2021-10-02 VITALS — BP 101/53 | HR 70 | Temp 97.2°F | Ht 66.0 in | Wt 199.4 lb

## 2021-10-02 DIAGNOSIS — R5383 Other fatigue: Secondary | ICD-10-CM | POA: Diagnosis not present

## 2021-10-02 DIAGNOSIS — D62 Acute posthemorrhagic anemia: Secondary | ICD-10-CM

## 2021-10-02 DIAGNOSIS — Z09 Encounter for follow-up examination after completed treatment for conditions other than malignant neoplasm: Secondary | ICD-10-CM | POA: Diagnosis not present

## 2021-10-02 DIAGNOSIS — K922 Gastrointestinal hemorrhage, unspecified: Secondary | ICD-10-CM

## 2021-10-02 DIAGNOSIS — R0609 Other forms of dyspnea: Secondary | ICD-10-CM | POA: Diagnosis not present

## 2021-10-02 DIAGNOSIS — I959 Hypotension, unspecified: Secondary | ICD-10-CM

## 2021-10-02 LAB — HEMOGLOBIN, FINGERSTICK: Hemoglobin: 9.2 g/dL — ABNORMAL LOW (ref 11.1–15.9)

## 2021-10-02 NOTE — Progress Notes (Signed)
Subjective:    Patient ID: Stacey Reyes, female    DOB: 07/17/1945, 76 y.o.   MRN: [id number]  Chief Complaint  Patient presents with   Transitions Of Care   Today's visit was for Transitional Care Management.  The patient was discharged from [facility] on 09/27/21 with a primary diagnosis of Upper GI bleed.   Contact with the patient and/or caregiver, by a clinical staff member, was made on 09/28/21 and was documented as a telephone encounter within the EMR.  Through chart review and discussion with the patient I have determined that management of their condition is of moderate complexity.   PT went to the ED with dark stools and hgb 6.8.  She had an EGD which found a dieulafoy lesion and capsule endoscopy that was negative.    She had 2 units of PRBC.  "During transfusion the patient developed about 15 minutes of transient word blocking, expressive aphasia for which neurology was consulted CT head, CTA head and neck, and MR brain have been unrevealing and symptoms abated spontaneously. " This has resolved and no complaints of memory changes.   She was discharged on Prontonix 40 mg BID, oral iron.   She is complaining of increased fatigue and SOB with exertion.  Her BP is low this morning.   Anemia Presents for follow-up visit. Symptoms include light-headedness and malaise/fatigue. There has been no palpitations.      Review of Systems  Constitutional:  Positive for malaise/fatigue.  Cardiovascular:  Negative for palpitations.  Neurological:  Positive for light-headedness.  All other systems reviewed and are negative.  Family History  Problem Relation Age of Onset   Colon cancer Mother 46       80's   Breast cancer Sister    Asthma Brother    Colon polyps Neg Hx    Esophageal cancer Neg Hx    Rectal cancer Neg Hx    Stomach cancer Neg Hx   . Social History   Socioeconomic History   Marital status: Married    Spouse name: Not on file   Number of children: 1    Years of education: Not on file   Highest education level: Not on file  Occupational History   Occupation: retired  Tobacco Use   Smoking status: Former    Types: Cigarettes    Quit date: 12/23/1984    Years since quitting: 36.8   Smokeless tobacco: Never  Vaping Use   Vaping Use: Never used  Substance and Sexual Activity   Alcohol use: Yes    Comment: rare   Drug use: No   Sexual activity: Not on file  Other Topics Concern   Not on file  Social History Narrative   Patient is married and retired and has 1 grown child   Social Determinants of Radio broadcast assistant Strain: [place]  (03/21/2021)   Overall Financial Resource Strain (CARDIA)    Difficulty of Paying Living Expenses: Not hard at all  Food Insecurity: No Food Insecurity (03/21/2021)   Hunger Vital Sign    Worried About Running Out of Food in the Last Year: Never true    [place] in the Last Year: Never true  Transportation Needs: No Transportation Needs (03/21/2021)   PRAPARE - Hydrologist (Medical): No    Lack of Transportation (Non-Medical): No  Physical Activity: Sufficiently Active (03/21/2021)   Exercise Vital Sign    Days of Exercise per Week:  1 day    Minutes of Exercise per Session: 150+ min  Stress: No Stress Concern Present (03/21/2021)   [place]    Feeling of Stress : Only a little  Social Connections: Moderately Integrated (03/21/2021)   Social Connection and Isolation Panel [NHANES]    Frequency of Communication with Friends and Family: More than three times a week    Frequency of Social Gatherings with Friends and Family: More than three times a week    Attends Religious Services: More than 4 times per year    Active Member of [employer] or Organizations: No    Attends Archivist Meetings: Never    Marital Status: Married       Objective:   Physical Exam Vitals reviewed.   Constitutional:      General: She is not in acute distress.    Appearance: She is well-developed.  HENT:     Head: Normocephalic and atraumatic.     Right Ear: External ear normal.  Eyes:     Pupils: Pupils are equal, round, and reactive to light.  Neck:     Thyroid: No thyromegaly.  Cardiovascular:     Rate and Rhythm: Normal rate and regular rhythm.     Heart sounds: Murmur heard.  Pulmonary:     Effort: Pulmonary effort is normal. No respiratory distress.     Breath sounds: Normal breath sounds. No wheezing.  Abdominal:     General: Bowel sounds are normal. There is no distension.     Palpations: Abdomen is soft.     Tenderness: There is no abdominal tenderness.  Musculoskeletal:        General: No tenderness. Normal range of motion.     Cervical back: Normal range of motion and neck supple.  Skin:    General: Skin is warm and dry.  Neurological:     Mental Status: She is alert and oriented to person, place, and time.     Cranial Nerves: No cranial nerve deficit.     Deep Tendon Reflexes: Reflexes are normal and symmetric.  Psychiatric:        Behavior: Behavior normal.        Thought Content: Thought content normal.        Judgment: Judgment normal.        BP (!) 84/54   Pulse 70   Temp (!) 97.2 F (36.2 C) (Temporal)   Ht '5\' 6"'$  (1.676 m)   Wt 199 lb 6 oz (90.4 kg)   SpO2 98%   BMI 32.18 kg/m   Assessment & Plan:  Stacey Reyes comes in today with chief complaint of Transitions Of Care   Diagnosis and orders addressed:  1. Upper GI bleed - CBC with Differential/Platelet - Hemoglobin, fingerstick  2. Hypotension, unspecified hypotension type  3. Hospital discharge follow-up  4. Acute blood loss anemia  5. Other fatigue  6. Dyspnea on exertion  Hgb in office is 9.2. Improving.  CBC pending  Given hypotension, will hold HCTZ  12.5 mg and Bisoprolol 2.5 mg.  Continue Protonix 40 mg BID Avoid NSAID's  Follow up with PCP in 1  week    Evelina Dun, FNP

## 2021-10-02 NOTE — Patient Instructions (Signed)
Gastrointestinal Bleeding Gastrointestinal (GI) bleeding is bleeding somewhere along the digestive tract, between the mouth and the anus. The digestive tract includes the mouth, esophagus, stomach, small intestine, large intestine, and anus. The large intestine is often called the colon. GI bleeding can be caused by various problems. The severity of these problems can be mild, serious, or life-threatening. If you have GI bleeding, you may find blood in your stools (feces), you may have black stools, or you may vomit blood. You may need to stay in the hospital if there is a lot of bleeding. What are the causes? This condition may be caused by: Inflammation, irritation, or swelling of the esophagus (esophagitis). The esophagus is part of the body that moves food from your mouth to your stomach. Swollen veins in the rectum (hemorrhoids). Tears in the anus (anal fissures). The tears are often caused by passing hard stool. Pouches that form on the colon and may bleed (diverticulosis). Inflammation in areas with diverticulosis. This is called diverticulitis.This can cause pain, fever, and bloody stools. Growths (polyps) or cancer. Colon cancer often starts out as precancerous polyps. Gastritis and ulcers. These may cause bleeding in the upper GI tract, near the stomach. What increases the risk? You are more likely to develop this condition if: You have an infection in your stomach from a type of bacteria called Helicobacter pylori. You take certain medicines, such as: NSAIDs. Aspirin. Selective serotonin reuptake inhibitors (SSRIs). Steroids. Antiplatelet or anticoagulant medicines. You smoke. You drink alcohol. What are the signs or symptoms? Common symptoms of this condition include: Bright red blood in your vomit, or vomit that looks like coffee grounds. Bloody, black, or tarry stools. Bleeding from the lower GI tract will usually cause red or maroon blood in the stools. Bleeding from the  upper GI tract may cause black, tarry stools that are often stronger smelling than usual. In certain cases, if the bleeding is fast enough, the stools may be red. Pain or cramping in the abdomen. How is this diagnosed? This condition may be diagnosed based on: Your medical history and a physical exam. Various tests, such as: Blood tests. Stool tests. X-rays and other imaging tests. Esophagogastroduodenoscopy (EGD). In this test, a flexible, lighted tube is used to look at your esophagus, stomach, and small intestine. Colonoscopy. In this test, a flexible, lighted tube is used to look at your colon. How is this treated? Treatment for this condition depends on the cause of the bleeding. For example: For bleeding from the esophagus, stomach, small intestine, or colon, the health care provider may do a procedure to stop bleeding during your EGD or colonoscopy. Inflammation or infection of the colon can be treated with medicines. Certain rectal problems can be treated with creams, suppositories, or warm baths. Medicines may be given to reduce acid in your stomach. Surgery is sometimes done. Blood transfusions are sometimes needed if a lot of blood has been lost. If there is a lot of bleeding, you will need to stay in the hospital for observation. If bleeding is mild, you may be allowed to go home. Follow these instructions at home:  Take over-the-counter and prescription medicines only as told by your health care provider. Eat foods that are high in fiber, such as beans, whole grains, and fresh fruits and vegetables. This will help to keep your stools soft. Eating 1-3 prunes each day works well for many people. Drink enough fluid to keep your urine pale yellow. Keep all follow-up visits. This is important. Contact a   health care provider if: Your symptoms do not improve with treatment. Get help right away if: Your bleeding does not stop. You feel light-headed or you faint. You feel  weak. You have severe cramps in your back or abdomen. You pass large blood clots in your stool. Your symptoms are getting worse. You have chest pain or fast heartbeats. These symptoms may be an emergency. Get help right away. Call 911. Do not wait to see if the symptoms will go away. Do not drive yourself to the hospital. Summary Gastrointestinal (GI) bleeding is bleeding somewhere along the digestive tract, between the mouth and anus. GI bleeding can be caused by various problems. Treatment for this condition depends on the cause of the bleeding. Take over-the-counter and prescription medicines only as told by your health care provider. Get help right away if your bleeding increases, your symptoms are getting worse, or you have new symptoms. Keep all follow-up visits. This is important. This information is not intended to replace advice given to you by your health care provider. Make sure you discuss any questions you have with your health care provider. Document Revised: 10/13/2020 Document Reviewed: 10/13/2020 Elsevier Patient Education  2023 [employer]  

## 2021-10-03 ENCOUNTER — Other Ambulatory Visit: Payer: Self-pay | Admitting: Family Medicine

## 2021-10-03 DIAGNOSIS — K922 Gastrointestinal hemorrhage, unspecified: Secondary | ICD-10-CM

## 2021-10-03 LAB — CBC WITH DIFFERENTIAL/PLATELET
Basophils Absolute: 0.1 10*3/uL (ref 0.0–0.2)
Basos: 1 %
EOS (ABSOLUTE): 0.2 10*3/uL (ref 0.0–0.4)
Eos: 4 %
Hematocrit: 24.5 % — ABNORMAL LOW (ref 34.0–46.6)
Hemoglobin: 7.7 g/dL — CL (ref 11.1–15.9)
Immature Grans (Abs): 0 10*3/uL (ref 0.0–0.1)
Immature Granulocytes: 0 %
Lymphocytes Absolute: 1.8 10*3/uL (ref 0.7–3.1)
Lymphs: 29 %
MCH: 28.6 pg (ref 26.6–33.0)
MCHC: 31.4 g/dL — ABNORMAL LOW (ref 31.5–35.7)
MCV: 91 fL (ref 79–97)
Monocytes Absolute: 0.7 10*3/uL (ref 0.1–0.9)
Monocytes: 11 %
Neutrophils Absolute: 3.4 10*3/uL (ref 1.4–7.0)
Neutrophils: 55 %
Platelets: 254 10*3/uL (ref 150–450)
RBC: 2.69 x10E6/uL — CL (ref 3.77–5.28)
RDW: 14.4 % (ref 11.7–15.4)
WBC: 6.1 10*3/uL (ref 3.4–10.8)

## 2021-10-05 ENCOUNTER — Emergency Department (HOSPITAL_COMMUNITY)
Admission: EM | Admit: 2021-10-05 | Discharge: 2021-10-05 | Disposition: A | Discharge status: Home / Self Care | Payer: Medicare Other | Source: Home / Self Care | Attending: Emergency Medicine | Admitting: Emergency Medicine

## 2021-10-05 ENCOUNTER — Emergency Department (HOSPITAL_COMMUNITY): Payer: Medicare Other

## 2021-10-05 ENCOUNTER — Other Ambulatory Visit: Payer: Self-pay

## 2021-10-05 ENCOUNTER — Encounter (HOSPITAL_COMMUNITY): Payer: Self-pay

## 2021-10-05 ENCOUNTER — Other Ambulatory Visit: Payer: Medicare Other

## 2021-10-05 ENCOUNTER — Telehealth: Payer: Self-pay | Admitting: Internal Medicine

## 2021-10-05 DIAGNOSIS — K31811 Angiodysplasia of stomach and duodenum with bleeding: Secondary | ICD-10-CM | POA: Diagnosis not present

## 2021-10-05 DIAGNOSIS — E782 Mixed hyperlipidemia: Secondary | ICD-10-CM | POA: Diagnosis not present

## 2021-10-05 DIAGNOSIS — K922 Gastrointestinal hemorrhage, unspecified: Secondary | ICD-10-CM

## 2021-10-05 DIAGNOSIS — Z87891 Personal history of nicotine dependence: Secondary | ICD-10-CM | POA: Diagnosis not present

## 2021-10-05 DIAGNOSIS — E669 Obesity, unspecified: Secondary | ICD-10-CM | POA: Diagnosis present

## 2021-10-05 DIAGNOSIS — R0602 Shortness of breath: Secondary | ICD-10-CM | POA: Insufficient documentation

## 2021-10-05 DIAGNOSIS — Z6832 Body mass index (BMI) 32.0-32.9, adult: Secondary | ICD-10-CM | POA: Diagnosis not present

## 2021-10-05 DIAGNOSIS — D649 Anemia, unspecified: Secondary | ICD-10-CM | POA: Insufficient documentation

## 2021-10-05 DIAGNOSIS — R000 Tachycardia, unspecified: Secondary | ICD-10-CM | POA: Diagnosis not present

## 2021-10-05 DIAGNOSIS — Z6830 Body mass index (BMI) 30.0-30.9, adult: Secondary | ICD-10-CM | POA: Diagnosis not present

## 2021-10-05 DIAGNOSIS — R011 Cardiac murmur, unspecified: Secondary | ICD-10-CM | POA: Diagnosis not present

## 2021-10-05 DIAGNOSIS — Z961 Presence of intraocular lens: Secondary | ICD-10-CM | POA: Diagnosis not present

## 2021-10-05 DIAGNOSIS — K921 Melena: Secondary | ICD-10-CM | POA: Diagnosis not present

## 2021-10-05 DIAGNOSIS — I10 Essential (primary) hypertension: Secondary | ICD-10-CM | POA: Diagnosis not present

## 2021-10-05 DIAGNOSIS — Z79899 Other long term (current) drug therapy: Secondary | ICD-10-CM | POA: Diagnosis not present

## 2021-10-05 DIAGNOSIS — Z9841 Cataract extraction status, right eye: Secondary | ICD-10-CM | POA: Diagnosis not present

## 2021-10-05 DIAGNOSIS — N3020 Other chronic cystitis without hematuria: Secondary | ICD-10-CM | POA: Diagnosis not present

## 2021-10-05 DIAGNOSIS — E785 Hyperlipidemia, unspecified: Secondary | ICD-10-CM | POA: Diagnosis not present

## 2021-10-05 DIAGNOSIS — K317 Polyp of stomach and duodenum: Secondary | ICD-10-CM | POA: Diagnosis not present

## 2021-10-05 DIAGNOSIS — D500 Iron deficiency anemia secondary to blood loss (chronic): Secondary | ICD-10-CM | POA: Diagnosis not present

## 2021-10-05 DIAGNOSIS — Z8719 Personal history of other diseases of the digestive system: Secondary | ICD-10-CM | POA: Diagnosis not present

## 2021-10-05 DIAGNOSIS — I428 Other cardiomyopathies: Secondary | ICD-10-CM | POA: Diagnosis not present

## 2021-10-05 DIAGNOSIS — R799 Abnormal finding of blood chemistry, unspecified: Secondary | ICD-10-CM | POA: Diagnosis not present

## 2021-10-05 DIAGNOSIS — D62 Acute posthemorrhagic anemia: Secondary | ICD-10-CM | POA: Diagnosis not present

## 2021-10-05 DIAGNOSIS — Z9842 Cataract extraction status, left eye: Secondary | ICD-10-CM | POA: Diagnosis not present

## 2021-10-05 DIAGNOSIS — I517 Cardiomegaly: Secondary | ICD-10-CM | POA: Diagnosis not present

## 2021-10-05 DIAGNOSIS — Z882 Allergy status to sulfonamides status: Secondary | ICD-10-CM | POA: Diagnosis not present

## 2021-10-05 DIAGNOSIS — K449 Diaphragmatic hernia without obstruction or gangrene: Secondary | ICD-10-CM | POA: Diagnosis not present

## 2021-10-05 DIAGNOSIS — Z86010 Personal history of colonic polyps: Secondary | ICD-10-CM | POA: Diagnosis not present

## 2021-10-05 LAB — CBC WITH DIFFERENTIAL/PLATELET
Abs Immature Granulocytes: 0.01 10*3/uL (ref 0.00–0.07)
Basophils Absolute: 0.1 10*3/uL (ref 0.0–0.2)
Basophils Absolute: 0.1 10*3/uL (ref 0.0–0.1)
Basophils Relative: 1 %
Basos: 1 %
EOS (ABSOLUTE): 0.2 10*3/uL (ref 0.0–0.4)
Eos: 3 %
Eosinophils Absolute: 0.1 10*3/uL (ref 0.0–0.5)
Eosinophils Relative: 2 %
HCT: 22.6 % — ABNORMAL LOW (ref 36.0–46.0)
Hematocrit: 22.6 % — ABNORMAL LOW (ref 34.0–46.6)
Hemoglobin: 7.1 g/dL — CL (ref 11.1–15.9)
Hemoglobin: 7.1 g/dL — ABNORMAL LOW (ref 12.0–15.0)
Immature Granulocytes: 0 %
Lymphocytes Absolute: 1.5 10*3/uL (ref 0.7–3.1)
Lymphocytes Relative: 25 %
Lymphs Abs: 1.5 10*3/uL (ref 0.7–4.0)
Lymphs: 27 %
MCH: 27.3 pg (ref 26.6–33.0)
MCH: 28.4 pg (ref 26.0–34.0)
MCHC: 31.4 g/dL — ABNORMAL LOW (ref 31.5–35.7)
MCHC: 31.4 g/dL (ref 30.0–36.0)
MCV: 87 fL (ref 79–97)
MCV: 90.4 fL (ref 80.0–100.0)
Monocytes Absolute: 0.5 10*3/uL (ref 0.1–0.9)
Monocytes Absolute: 0.5 10*3/uL (ref 0.1–1.0)
Monocytes Relative: 9 %
Monocytes: 9 %
Neutro Abs: 3.8 10*3/uL (ref 1.7–7.7)
Neutrophils Absolute: 3.3 10*3/uL (ref 1.4–7.0)
Neutrophils Relative %: 63 %
Neutrophils: 60 %
Platelets: 259 10*3/uL (ref 150–450)
Platelets: 236 10*3/uL (ref 150–400)
RBC: 2.6 x10E6/uL — CL (ref 3.77–5.28)
RBC: 2.5 MIL/uL — ABNORMAL LOW (ref 3.87–5.11)
RDW: 15.9 % — ABNORMAL HIGH (ref 11.7–15.4)
RDW: 14.6 % (ref 11.5–15.5)
WBC: 5.5 10*3/uL (ref 3.4–10.8)
WBC: 6.1 10*3/uL (ref 4.0–10.5)
nRBC: 0 % (ref 0.0–0.2)

## 2021-10-05 LAB — PROTIME-INR
INR: 1.1 (ref 0.8–1.2)
Prothrombin Time: 14.3 seconds (ref 11.4–15.2)

## 2021-10-05 LAB — BASIC METABOLIC PANEL
Anion gap: 9 (ref 5–15)
BUN: 9 mg/dL (ref 8–23)
CO2: 24 mmol/L (ref 22–32)
Calcium: 9 mg/dL (ref 8.9–10.3)
Chloride: 105 mmol/L (ref 98–111)
Creatinine, Ser: 0.73 mg/dL (ref 0.44–1.00)
GFR, Estimated: >60 mL/min (ref >60)
Glucose, Bld: 109 mg/dL — ABNORMAL HIGH (ref 70–99)
Potassium: 4 mmol/L (ref 3.5–5.1)
Sodium: 138 mmol/L (ref 135–145)

## 2021-10-05 LAB — POC OCCULT BLOOD, ED: Fecal Occult Bld: POSITIVE — AB

## 2021-10-05 LAB — MAGNESIUM: Magnesium: 2 mg/dL (ref 1.7–2.4)

## 2021-10-05 LAB — CK: Total CK: 62 U/L (ref 38–234)

## 2021-10-05 LAB — BRAIN NATRIURETIC PEPTIDE: B Natriuretic Peptide: 423.4 pg/mL — ABNORMAL HIGH (ref 0.0–100.0)

## 2021-10-05 LAB — TROPONIN I (HIGH SENSITIVITY)
Troponin I (High Sensitivity): 23 ng/L — ABNORMAL HIGH (ref <18)
Troponin I (High Sensitivity): 25 ng/L — ABNORMAL HIGH (ref <18)

## 2021-10-05 LAB — PREPARE RBC (CROSSMATCH)

## 2021-10-05 MED ORDER — SODIUM CHLORIDE 0.9 % IV SOLN
10.0000 mL/h | Freq: Once | INTRAVENOUS | Status: AC
  Administered 2021-10-05: 10 mL/h via INTRAVENOUS

## 2021-10-05 NOTE — ED Triage Notes (Signed)
Pt states she was sent from PCP d/t hgb=7.1.  Pt states she feels like hgb is lower than 7 d/t chest palpitations, shortness of breath and muscle aches.

## 2021-10-05 NOTE — Telephone Encounter (Signed)
Inbound call from patient stating her hemoglobin is at a 7.1 and she is experiencing problems breathing. Advise patient to go to emergency room. Patient verbalized understanding. She is currently on her way there now. Please give patient a call at your earliest convenience to further advise.   Thank you

## 2021-10-05 NOTE — ED Provider Notes (Signed)
[facility] EMERGENCY DEPARTMENT Provider Note   CSN: [id number] Arrival date & time: 10/05/21  1121     History  Chief Complaint  Patient presents with   low hemoglobin    Stacey Reyes is a 76 y.o. female.  She was just admitted to the hospital last week for anemia shortness of breath and possible GI bleed.  She had an endoscopy by [facility] with no clear source identified.  She was transfused 2 units of blood and was discharged with a hemoglobin of 7.9.  She had some outpatient blood drawn by her PCP that was 9.1 and then again today that showed 7.1.  Today she felt more short of breath and felt her heart racing.  She has noticed mostly brown stools although some been darker.  Her PCP felt she needed to come here for further evaluation.  No other bleeding noted.  The history is provided by the patient.  Palpitations Palpitations quality:  Fast Onset quality:  Gradual Duration:  1 day Timing:  Intermittent Progression:  Unchanged Chronicity:  Recurrent Associated symptoms: shortness of breath   Associated symptoms: no chest pain, no nausea and no vomiting        Home Medications Prior to Admission medications   Medication Sig Start Date End Date Taking? Authorizing Provider  Ascorbic Acid (VITAMIN C PO) Take 1 tablet by mouth daily.    [provider]  bisoprolol (ZEBETA) 5 MG tablet TAKE ONE-HALF TABLET BY  MOUTH DAILY Patient taking differently: Take 2.5 mg by mouth daily. 09/07/21   Minus Breeding, MD  CALCIUM PO Take 1 tablet by mouth daily.    [provider]  Cholecalciferol (VITAMIN D-3 PO) Take 1 capsule by mouth daily.    [provider]  ferrous sulfate 325 (65 FE) MG tablet Take 1 tablet (325 mg total) by mouth daily. 09/27/21 09/27/22  Patrecia Pour, MD  hydrochlorothiazide (MICROZIDE) 12.5 MG capsule Take 1 capsule (12.5 mg total) by mouth daily. Patient taking differently: Take 12.5 mg by mouth at bedtime. 06/11/21    [name], DO  levocetirizine (XYZAL) 5 MG tablet TAKE 1 TABLET (5 MG TOTAL) BY MOUTH AT BEDTIME AS NEEDED FOR ALLERGIES (DRAINAGE). 09/03/21   [name], DO  MAGNESIUM PO Take 1 tablet by mouth daily.    [provider]  pantoprazole (PROTONIX) 40 MG tablet Take 1 tablet (40 mg total) by mouth 2 (two) times daily. 09/19/21   Orson Eva, MD  rosuvastatin (CRESTOR) 10 MG tablet Take 1 tablet (10 mg total) by mouth at bedtime. 06/11/21   [name], DO  traZODone (DESYREL) 50 MG tablet Take 1 tablet (50 mg total) by mouth at bedtime as needed for sleep. 04/02/21   [name], DO  VITAMIN E PO Take 1 tablet by mouth daily.    [provider]      Allergies    Sulfa antibiotics    Review of Systems   Review of Systems  Constitutional:  Positive for fatigue. Negative for fever.  HENT:  Negative for sore throat.   Eyes:  Negative for visual disturbance.  Respiratory:  Positive for shortness of breath.   Cardiovascular:  Positive for palpitations. Negative for chest pain.  Gastrointestinal:  Negative for nausea and vomiting.  Genitourinary:  Negative for dysuria.  Neurological:  Positive for light-headedness.    Physical Exam Updated Vital Signs BP 124/77 (BP Location: Right Arm)   Pulse 88  Temp 99 F (37.2 C) (Oral)   Resp 20   Ht '5\' 6"'$  (1.676 m)   Wt 90.3 kg   SpO2 96%   BMI 32.12 kg/m  Physical Exam Vitals and nursing note reviewed.  Constitutional:      General: She is not in acute distress.    Appearance: Normal appearance. She is well-developed. She is obese.  HENT:     Head: Normocephalic and atraumatic.  Eyes:     Conjunctiva/sclera: Conjunctivae normal.  Cardiovascular:     Rate and Rhythm: Normal rate and regular rhythm.     Heart sounds: Murmur heard.  Pulmonary:     Effort: Pulmonary effort is normal. No respiratory distress.     Breath sounds: Normal breath sounds.  Abdominal:     Palpations: Abdomen is  soft.     Tenderness: There is no abdominal tenderness. There is no guarding or rebound.  Musculoskeletal:        General: Normal range of motion.     Cervical back: Neck supple.     Right lower leg: No edema.     Left lower leg: No edema.  Skin:    General: Skin is warm and dry.     Capillary Refill: Capillary refill takes less than 2 seconds.  Neurological:     General: No focal deficit present.     Mental Status: She is alert.     ED Results / Procedures / Treatments   Labs (all labs ordered are listed, but only abnormal results are displayed) Labs Reviewed  BASIC METABOLIC PANEL - Abnormal; Notable for the following components:      Result Value   Glucose, Bld 109 (*)    All other components within normal limits  CBC WITH DIFFERENTIAL/PLATELET - Abnormal; Notable for the following components:   RBC 2.50 (*)    Hemoglobin 7.1 (*)    HCT 22.6 (*)    All other components within normal limits  BRAIN NATRIURETIC PEPTIDE - Abnormal; Notable for the following components:   B Natriuretic Peptide 423.4 (*)    All other components within normal limits  POC OCCULT BLOOD, ED - Abnormal; Notable for the following components:   Fecal Occult Bld POSITIVE (*)    All other components within normal limits  TROPONIN I (HIGH SENSITIVITY) - Abnormal; Notable for the following components:   Troponin I (High Sensitivity) 23 (*)    All other components within normal limits  TROPONIN I (HIGH SENSITIVITY) - Abnormal; Notable for the following components:   Troponin I (High Sensitivity) 25 (*)    All other components within normal limits  MAGNESIUM  CK  PROTIME-INR  CBC WITH DIFFERENTIAL/PLATELET  TYPE AND SCREEN  PREPARE RBC (CROSSMATCH)    EKG None EKG did not crossover in epic.  Sinus rhythm with ST depressions and T wave inversions similar to prior EKG last month. Radiology DG Chest 2 View  Result Date: 10/05/2021 CLINICAL DATA:  76 year old female presenting for evaluation of  shortness of breath. EXAM: CHEST - 2 VIEW COMPARISON:  April 18, 2021. FINDINGS: Trachea midline. Cardiomediastinal contours and hilar structures are stable. Mild cardiomegaly. No lobar consolidation.  No sign of pleural effusion. On limited assessment there is no acute skeletal process. IMPRESSION: Mild cardiomegaly without acute cardiopulmonary disease. Electronically Signed   By: Zetta Bills M.D.   On: 10/05/2021 12:20    Procedures .Critical Care  Performed by: Hayden Rasmussen, MD Authorized by: Hayden Rasmussen, MD   Critical care  provider statement:    Critical care time (minutes):  45   Critical care time was exclusive of:  Separately billable procedures and treating other patients   Critical care was necessary to treat or prevent imminent or life-threatening deterioration of the following conditions:  Circulatory failure   Critical care was time spent personally by me on the following activities:  Development of treatment plan with patient or surrogate, discussions with consultants, evaluation of patient's response to treatment, examination of patient, obtaining history from patient or surrogate, ordering and performing treatments and interventions, ordering and review of laboratory studies, ordering and review of radiographic studies, pulse oximetry, re-evaluation of patient's condition and review of old charts   I assumed direction of critical care for this patient from another provider in my specialty: no       Medications Ordered in ED Medications  0.9 %  sodium chloride infusion (0 mL/hr Intravenous Stopped 10/05/21 1703)    ED Course/ Medical Decision Making/ A&P Clinical Course as of 10/05/21 1839  Fri Oct 05, 2021  1328 Rectal exam done with nurse Dessa Phi as chaperone.  Normal tone no masses.  Sample sent to lab for guaiac [MB]  1413 Reviewed case with [facility] PA collier.  She did not feel the patient needed to be admitted from a GI standpoint.  Recommended  outpatient follow-up with heme-onc regarding iron transfusion and outpatient follow-up with [facility].  Patient comfortable plan for transfusion and see how much she is feeling improved. [MB]    Clinical Course User Index [MB] Hayden Rasmussen, MD                           Medical Decision Making Amount and/or Complexity of Data Reviewed Labs: ordered.  Risk Prescription drug management.   This patient complains of low hemoglobin, shortness of breath palpitations; this involves an extensive number of treatment Options and is a complaint that carries with it a high risk of complications and morbidity. The differential includes GI bleed, anemia, ACS, pneumonia, hypoxia  I ordered, reviewed and interpreted labs, which included CBC with normal white count, hemoglobin low trended down from prior, chemistries fairly unremarkable, BNP mildly elevated, fecal occult positive, troponins elevated but flat I ordered medication IV fluids IV transfusion of packed red blood cells and reviewed PMP when indicated. I ordered imaging studies which included chest x-ray and I independently    visualized and interpreted imaging which showed no acute findings Additional history obtained from patient's companion Previous records obtained and reviewed in epic including prior discharge summary and GI notes I consulted [facility] PA collier and discussed lab and imaging findings and discussed disposition.  Cardiac monitoring reviewed, normal sinus rhythm Social determinants considered, no significant barriers Critical Interventions: Transfusion of packed red blood cells for symptomatic anemia  After the interventions stated above, I reevaluated the patient and found patient to be symptomatically improved Admission and further testing considered, her care is signed to Dr. Francia Greaves to follow-up on her response to transfusion.  If she is continuing to feel better she can be discharged and follow-up outpatient with  her treatment team.         Final Clinical Impression(s) / ED Diagnoses Final diagnoses:  Symptomatic anemia    Rx / DC Orders ED Discharge Orders     None         Hayden Rasmussen, MD 10/05/21 1900

## 2021-10-05 NOTE — Telephone Encounter (Signed)
Spoke with Pt. Pt stated that she is currently on the way to the [facility] stated that she had labs done this AM and her Hgb was 7.1. Pt stated that she feels that her heart is beating fast: Pt was notified to go straight to ED and tell them her symptoms:  Pt verbalized understanding with all questions answered.

## 2021-10-05 NOTE — ED Provider Triage Note (Signed)
Emergency Medicine Provider Triage Evaluation Note  Stacey Reyes , a 76 y.o. female  was evaluated in triage.  Pt complains of low hemoglobin.  Patient states she was recently discharged for presumed GI bleed where she was found to be anemic.  She says they could not really figure out where the bleeding was coming from as her endoscopy did not reveal any active bleeding.  She says she has been feeling better since discharge until today when she woke up feeling very weak, had shortness of breath, has frontal chest pain, palpitations, feels dizzy, and has generalized myalgias.  She has noticed 1 or 2 dark stools, but nothing regular.  She was seen by her primary doctor today who did a hemoglobin and her hemoglobin is measured to be 7.1.  Her primary doctor says that he thinks it might be lower because the machine has been running higher lately.  Review of Systems  Positive:  Negative:   Physical Exam  BP 124/77 (BP Location: Right Arm)   Pulse 88   Temp 99 F (37.2 C) (Oral)   Resp 20   Ht '5\' 6"'$  (1.676 m)   Wt 90.3 kg   SpO2 96%   BMI 32.12 kg/m  Gen:   Awake, no distress   Resp:  Normal effort  MSK:   Moves extremities without difficulty  Other:    Medical Decision Making  Medically screening exam initiated at 11:54 AM.  Appropriate orders placed.  [name] was informed that the remainder of the evaluation will be completed by another provider, this initial triage assessment does not replace that evaluation, and the importance of remaining in the ED until their evaluation is complete.     Adolphus Birchwood, [place] 10/05/21 1156

## 2021-10-05 NOTE — Discharge Instructions (Signed)
You were seen in the emergency department for evaluation of worsening shortness of breath and palpitations in the setting of a recent bleeding episode.  Your blood count was low at 7.1 and you received a transfusion of 1 unit of packed red blood cells.  Gastroenterology did not feel you needed to be readmitted for any further work-up at this time.  They did recommend outpatient follow-up and also contact with your hematologist regarding possible iron supplementation.  Please drink plenty of fluids and rest.  Recheck your blood counts with your primary care doctor.  Return to the emergency department if any worsening or concerning symptoms.

## 2021-10-06 ENCOUNTER — Inpatient Hospital Stay (HOSPITAL_COMMUNITY)
Admission: EM | Admit: 2021-10-06 | Discharge: 2021-10-09 | DRG: 378 | Disposition: A | Discharge status: Home / Self Care | Payer: Medicare Other | Attending: Internal Medicine | Admitting: Internal Medicine

## 2021-10-06 ENCOUNTER — Encounter (HOSPITAL_COMMUNITY): Payer: Self-pay | Admitting: Emergency Medicine

## 2021-10-06 ENCOUNTER — Other Ambulatory Visit: Payer: Self-pay

## 2021-10-06 DIAGNOSIS — N3020 Other chronic cystitis without hematuria: Secondary | ICD-10-CM | POA: Diagnosis present

## 2021-10-06 DIAGNOSIS — I10 Essential (primary) hypertension: Secondary | ICD-10-CM | POA: Diagnosis present

## 2021-10-06 DIAGNOSIS — Z9842 Cataract extraction status, left eye: Secondary | ICD-10-CM

## 2021-10-06 DIAGNOSIS — D62 Acute posthemorrhagic anemia: Secondary | ICD-10-CM | POA: Diagnosis present

## 2021-10-06 DIAGNOSIS — R799 Abnormal finding of blood chemistry, unspecified: Secondary | ICD-10-CM | POA: Diagnosis not present

## 2021-10-06 DIAGNOSIS — Z9841 Cataract extraction status, right eye: Secondary | ICD-10-CM

## 2021-10-06 DIAGNOSIS — K922 Gastrointestinal hemorrhage, unspecified: Principal | ICD-10-CM

## 2021-10-06 DIAGNOSIS — Z79899 Other long term (current) drug therapy: Secondary | ICD-10-CM

## 2021-10-06 DIAGNOSIS — K317 Polyp of stomach and duodenum: Secondary | ICD-10-CM | POA: Diagnosis present

## 2021-10-06 DIAGNOSIS — Z882 Allergy status to sulfonamides status: Secondary | ICD-10-CM

## 2021-10-06 DIAGNOSIS — E782 Mixed hyperlipidemia: Secondary | ICD-10-CM | POA: Diagnosis not present

## 2021-10-06 DIAGNOSIS — K31811 Angiodysplasia of stomach and duodenum with bleeding: Principal | ICD-10-CM | POA: Diagnosis present

## 2021-10-06 DIAGNOSIS — Z8719 Personal history of other diseases of the digestive system: Secondary | ICD-10-CM

## 2021-10-06 DIAGNOSIS — E669 Obesity, unspecified: Secondary | ICD-10-CM | POA: Diagnosis present

## 2021-10-06 DIAGNOSIS — K449 Diaphragmatic hernia without obstruction or gangrene: Secondary | ICD-10-CM | POA: Diagnosis present

## 2021-10-06 DIAGNOSIS — Z87891 Personal history of nicotine dependence: Secondary | ICD-10-CM

## 2021-10-06 DIAGNOSIS — E785 Hyperlipidemia, unspecified: Secondary | ICD-10-CM | POA: Diagnosis present

## 2021-10-06 DIAGNOSIS — K921 Melena: Secondary | ICD-10-CM | POA: Diagnosis present

## 2021-10-06 DIAGNOSIS — R000 Tachycardia, unspecified: Secondary | ICD-10-CM | POA: Diagnosis present

## 2021-10-06 DIAGNOSIS — R011 Cardiac murmur, unspecified: Secondary | ICD-10-CM | POA: Diagnosis present

## 2021-10-06 DIAGNOSIS — Z86010 Personal history of colonic polyps: Secondary | ICD-10-CM

## 2021-10-06 DIAGNOSIS — Z961 Presence of intraocular lens: Secondary | ICD-10-CM | POA: Diagnosis present

## 2021-10-06 DIAGNOSIS — D649 Anemia, unspecified: Secondary | ICD-10-CM | POA: Diagnosis not present

## 2021-10-06 DIAGNOSIS — Z6832 Body mass index (BMI) 32.0-32.9, adult: Secondary | ICD-10-CM

## 2021-10-06 LAB — CBC
HCT: 24.8 % — ABNORMAL LOW (ref 36.0–46.0)
Hemoglobin: 7.7 g/dL — ABNORMAL LOW (ref 12.0–15.0)
MCH: 27.9 pg (ref 26.0–34.0)
MCHC: 31 g/dL (ref 30.0–36.0)
MCV: 89.9 fL (ref 80.0–100.0)
Platelets: 241 10*3/uL (ref 150–400)
RBC: 2.76 MIL/uL — ABNORMAL LOW (ref 3.87–5.11)
RDW: 14.9 % (ref 11.5–15.5)
WBC: 7.1 10*3/uL (ref 4.0–10.5)
nRBC: 0 % (ref 0.0–0.2)

## 2021-10-06 LAB — COMPREHENSIVE METABOLIC PANEL
ALT: 14 U/L (ref 0–44)
AST: 21 U/L (ref 15–41)
Albumin: 3.9 g/dL (ref 3.5–5.0)
Alkaline Phosphatase: 24 U/L — ABNORMAL LOW (ref 38–126)
Anion gap: 4 — ABNORMAL LOW (ref 5–15)
BUN: 17 mg/dL (ref 8–23)
CO2: 25 mmol/L (ref 22–32)
Calcium: 8.6 mg/dL — ABNORMAL LOW (ref 8.9–10.3)
Chloride: 108 mmol/L (ref 98–111)
Creatinine, Ser: 0.76 mg/dL (ref 0.44–1.00)
GFR, Estimated: >60 mL/min (ref >60)
Glucose, Bld: 150 mg/dL — ABNORMAL HIGH (ref 70–99)
Potassium: 3.7 mmol/L (ref 3.5–5.1)
Sodium: 137 mmol/L (ref 135–145)
Total Bilirubin: 0.9 mg/dL (ref 0.3–1.2)
Total Protein: 6.2 g/dL — ABNORMAL LOW (ref 6.5–8.1)

## 2021-10-06 LAB — HEMOGLOBIN AND HEMATOCRIT, BLOOD
HCT: 23.1 % — ABNORMAL LOW (ref 36.0–46.0)
Hemoglobin: 7.3 g/dL — ABNORMAL LOW (ref 12.0–15.0)

## 2021-10-06 LAB — TYPE AND SCREEN
ABO/RH(D): O POS
Antibody Screen: NEGATIVE
Unit division: 0

## 2021-10-06 LAB — POC OCCULT BLOOD, ED: Fecal Occult Bld: POSITIVE — AB

## 2021-10-06 LAB — BPAM RBC
Blood Product Expiration Date: 202308132359
ISSUE DATE / TIME: 202307141406
Unit Type and Rh: 5100

## 2021-10-06 MED ORDER — CALCIUM GLUCONATE-NACL 1-0.675 GM/50ML-% IV SOLN
1.0000 g | Freq: Once | INTRAVENOUS | Status: AC
  Administered 2021-10-06: 1000 mg via INTRAVENOUS
  Filled 2021-10-06: qty 50

## 2021-10-06 MED ORDER — ORAL CARE MOUTH RINSE: 15.0000 mL | OROMUCOSAL | Status: DC | PRN

## 2021-10-06 MED ORDER — PANTOPRAZOLE SODIUM 40 MG IV SOLR: 40.0000 mg | Freq: Two times a day (BID) | INTRAVENOUS | Status: DC

## 2021-10-06 MED ORDER — ROSUVASTATIN CALCIUM 20 MG PO TABS
10.0000 mg | ORAL_TABLET | Freq: Every day | ORAL | Status: DC
Start: 2021-10-06 — End: 2021-10-09
  Administered 2021-10-06 – 2021-10-08 (×3): 10 mg via ORAL
  Filled 2021-10-06 (×3): qty 1

## 2021-10-06 MED ORDER — PANTOPRAZOLE INFUSION (NEW) - SIMPLE MED
8.0000 mg/h | INTRAVENOUS | Status: DC
  Administered 2021-10-06 – 2021-10-08 (×4): 8 mg/h via INTRAVENOUS
  Filled 2021-10-06: qty 80
  Filled 2021-10-06 (×8): qty 100

## 2021-10-06 MED ORDER — ACETAMINOPHEN 650 MG RE SUPP: 650.0000 mg | Freq: Four times a day (QID) | RECTAL | Status: DC | PRN

## 2021-10-06 MED ORDER — LEVOCETIRIZINE DIHYDROCHLORIDE 5 MG PO TABS: 5.0000 mg | ORAL_TABLET | Freq: Every evening | ORAL | Status: DC | PRN

## 2021-10-06 MED ORDER — ALBUTEROL SULFATE (2.5 MG/3ML) 0.083% IN NEBU
2.5000 mg | INHALATION_SOLUTION | Freq: Four times a day (QID) | RESPIRATORY_TRACT | Status: DC | PRN
Start: 2021-10-06 — End: 2021-10-09

## 2021-10-06 MED ORDER — SODIUM CHLORIDE 0.9% FLUSH
3.0000 mL | Freq: Two times a day (BID) | INTRAVENOUS | Status: DC
  Administered 2021-10-07 – 2021-10-09 (×4): 3 mL via INTRAVENOUS

## 2021-10-06 MED ORDER — TRAZODONE HCL 50 MG PO TABS
50.0000 mg | ORAL_TABLET | Freq: Every evening | ORAL | Status: DC | PRN
  Administered 2021-10-06 – 2021-10-08 (×3): 50 mg via ORAL
  Filled 2021-10-06 (×3): qty 1

## 2021-10-06 MED ORDER — PANTOPRAZOLE 80MG IVPB - SIMPLE MED
80.0000 mg | Freq: Once | INTRAVENOUS | Status: AC
  Administered 2021-10-06: 80 mg via INTRAVENOUS
  Filled 2021-10-06: qty 100

## 2021-10-06 MED ORDER — HYDRALAZINE HCL 20 MG/ML IJ SOLN: 5.0000 mg | INTRAMUSCULAR | Status: DC | PRN

## 2021-10-06 MED ORDER — ONDANSETRON HCL 4 MG PO TABS: 4.0000 mg | ORAL_TABLET | Freq: Four times a day (QID) | ORAL | Status: DC | PRN

## 2021-10-06 MED ORDER — ONDANSETRON HCL 4 MG/2ML IJ SOLN: 4.0000 mg | Freq: Four times a day (QID) | INTRAMUSCULAR | Status: DC | PRN

## 2021-10-06 MED ORDER — ACETAMINOPHEN 325 MG PO TABS
650.0000 mg | ORAL_TABLET | Freq: Four times a day (QID) | ORAL | Status: DC | PRN
  Administered 2021-10-07: 650 mg via ORAL
  Filled 2021-10-06: qty 2

## 2021-10-06 NOTE — H&P (Incomplete Revision)
History and Physical    Patient: Stacey Reyes CBJ:[id number] DOB: [birth year] DOA: 10/06/2021 DOS: the patient was seen and examined on 10/06/2021 PCP: [name], DO  Patient coming from: Home  Chief Complaint:  Chief Complaint  Patient presents with   Rectal Bleeding   HPI: Stacey Reyes is a 76 y.o. female with medical history significant of hypertension, hyperlipidemia, anemia, diverticulitis, colonic polyps, and GI bleed who presents with complaints of having dark black stool this morning.  She had been hospitalized 6/25-6/28 for upper GI bleed at [facility] where EGD revealed blood present in the duodenum in the bulb, second, and third portion of the duodenum without presence of active bleeding source. Thought to be a Dieulafoy lesion leading to her presentation.  Subsequently hospitalized 7/4-7/6 where hemoglobin was down to 6.2 g/dL up to 7.9 g/dL after she was transfused 2 units of packed red blood cells.  GI have been consulted, but no source noted on EGD and capsule endoscopy was negative.  Hemoglobin remained stable and she was discharged home yesterday patient reported having generalized malaise feeling weak and achy all over which is a usual sign that that her blood counts may be low. She had gone to [facility] to be evaluated and was found to have hemoglobin 7.1 g/dL and she was transfused 1 unit of packed red blood cells prior to being discharged home.  This morning patient reported waking up with some lower abdominal pain prior to having a large black liquid bowel movement.  She reports pain resolved after having the bowel movement and has not recurred.  The only other symptom that she reports is having some shortness of breath with exertion.  Denies having any recent NSAID use, fevers, cough, nausea, vomiting, dysuria, or recent sick contacts.  Upon admission into the emergency department patient was noted to be afebrile with 106/55-146/67 and all other vital  signs maintained.  Labs significant for hemoglobin 7.7, calcium 8.6, glucose 150, and glucose 150.  Patient has been typed and screened for possible need of blood products.  Patient was started on a Protonix drip.  Dr. Sydell Axon of gastroenterology was consulted and will see the patient.  Review of Systems: As mentioned in the history of present illness. All other systems reviewed and are negative. Past Medical History:  Diagnosis Date   Anemia    Blood transfusion without reported diagnosis    Cataract    removed bilateral   Chronic cystitis    Diverticulitis    Heart murmur    Hyperlipidemia    Hypertension    Personal history of colonic polyps-adenomas 01/07/2012   2009 - 2 diminutive adenomas (prior polyps also) 01/07/2012 - 2 diminutive adenomas     Past Surgical History:  Procedure Laterality Date   BREAST BIOPSY Right    No Scar seen    COLONOSCOPY  multiple   ENTEROSCOPY N/A 09/26/2021   Procedure: ENTEROSCOPY;  Surgeon: Gatha Mayer, MD;  Location: [place];  Service: Gastroenterology;  Laterality: N/A;   ESOPHAGOGASTRODUODENOSCOPY (EGD) WITH PROPOFOL N/A 11/23/2019   Procedure: ESOPHAGOGASTRODUODENOSCOPY (EGD) WITH PROPOFOL;  Surgeon: Daneil Dolin, MD;  [facility];  Service: Endoscopy;  Laterality: N/A;   ESOPHAGOGASTRODUODENOSCOPY (EGD) WITH PROPOFOL N/A 10/20/2020   Procedure: ESOPHAGOGASTRODUODENOSCOPY (EGD) WITH PROPOFOL;  Surgeon: Eloise Harman, DO;  [facility];  Service: Endoscopy;  Laterality: N/A;   ESOPHAGOGASTRODUODENOSCOPY (EGD) WITH PROPOFOL N/A 12/05/2020   Procedure: ESOPHAGOGASTRODUODENOSCOPY (EGD) WITH PROPOFOL;  Surgeon:  Rogene Houston, MD;  [facility];  Service: Endoscopy;  Laterality: N/A;  with enteroscopy   ESOPHAGOGASTRODUODENOSCOPY (EGD) WITH PROPOFOL N/A 12/11/2020   Procedure: ESOPHAGOGASTRODUODENOSCOPY (EGD) WITH PROPOFOL;  Surgeon: Eloise Harman, DO;  [facility];  Service: Endoscopy;   Laterality: N/A;   ESOPHAGOGASTRODUODENOSCOPY (EGD) WITH PROPOFOL N/A 09/17/2021   Procedure: ESOPHAGOGASTRODUODENOSCOPY (EGD) WITH PROPOFOL;  Surgeon: Eloise Harman, DO;  [facility];  Service: Endoscopy;  Laterality: N/A;   GIVENS CAPSULE STUDY N/A 11/23/2019   Procedure: GIVENS CAPSULE STUDY;  Surgeon: Daneil Dolin, MD;  [facility];  Service: Endoscopy;  Laterality: N/A;   GIVENS CAPSULE STUDY N/A 10/22/2020   Procedure: GIVENS CAPSULE STUDY;  Surgeon: Eloise Harman, DO;  [facility];  Service: Endoscopy;  Laterality: N/A;   GIVENS CAPSULE STUDY N/A 09/26/2021   Procedure: GIVENS CAPSULE STUDY;  Surgeon: Gatha Mayer, MD;  Location: [place];  Service: Gastroenterology;  Laterality: N/A;   HEMOSTASIS CLIP PLACEMENT  12/11/2020   Procedure: HEMOSTASIS CLIP PLACEMENT;  Surgeon: Eloise Harman, DO;  [facility];  Service: Endoscopy;;   IR ANGIOGRAM SELECTIVE EACH ADDITIONAL VESSEL  12/05/2020   IR ANGIOGRAM SELECTIVE EACH ADDITIONAL VESSEL  12/05/2020   IR ANGIOGRAM SELECTIVE EACH ADDITIONAL VESSEL  12/05/2020   IR ANGIOGRAM SELECTIVE EACH ADDITIONAL VESSEL  12/13/2020   IR ANGIOGRAM SELECTIVE EACH ADDITIONAL VESSEL  12/13/2020   IR ANGIOGRAM VISCERAL SELECTIVE  12/05/2020   IR ANGIOGRAM VISCERAL SELECTIVE  12/13/2020   IR EMBO ART  VEN HEMORR LYMPH EXTRAV  INC GUIDE ROADMAPPING  12/05/2020   IR EMBO ART  VEN HEMORR LYMPH EXTRAV  INC GUIDE ROADMAPPING  12/13/2020   IR RADIOLOGIST EVAL & MGMT  01/11/2021   IR US GUIDE VASC ACCESS LEFT  12/13/2020   IR US GUIDE VASC ACCESS RIGHT  12/05/2020   SUBMUCOSAL TATTOO INJECTION  09/26/2021   Procedure: SUBMUCOSAL TATTOO INJECTION;  Surgeon: Gatha Mayer, MD;  Location: [facility];  Service: Gastroenterology;;   Social History:  reports that she quit smoking about 36 years ago. Her smoking use included cigarettes. She has never used smokeless tobacco. She reports current alcohol use. She reports  that she does not use drugs.  Allergies  Allergen Reactions   Sulfa Antibiotics Rash    Family History  Problem Relation Age of Onset   Colon cancer Mother 44       80's   Breast cancer Sister    Asthma Brother    Colon polyps Neg Hx    Esophageal cancer Neg Hx    Rectal cancer Neg Hx    Stomach cancer Neg Hx     Prior to Admission medications   Medication Sig Start Date End Date Taking? Authorizing Provider  Ascorbic Acid (VITAMIN C PO) Take 1 tablet by mouth daily.    [provider]  bisoprolol (ZEBETA) 5 MG tablet TAKE ONE-HALF TABLET BY  MOUTH DAILY Patient not taking: Reported on 10/05/2021 09/07/21   Minus Breeding, MD  CALCIUM PO Take 1 tablet by mouth daily.    [provider]  Cholecalciferol (VITAMIN D-3 PO) Take 1 capsule by mouth daily.    [provider]  ferrous sulfate 325 (65 FE) MG tablet Take 1 tablet (325 mg total) by mouth daily. 09/27/21 09/27/22  Patrecia Pour, MD  hydrochlorothiazide (MICROZIDE) 12.5 MG capsule Take 1 capsule (12.5 mg total) by mouth daily. Patient not taking: Reported  on 10/05/2021 06/11/21   [name], DO  levocetirizine (XYZAL) 5 MG tablet TAKE 1 TABLET (5 MG TOTAL) BY MOUTH AT BEDTIME AS NEEDED FOR ALLERGIES (DRAINAGE). 09/03/21   [name], DO  MAGNESIUM PO Take 1 tablet by mouth daily.    [provider]  pantoprazole (PROTONIX) 40 MG tablet Take 1 tablet (40 mg total) by mouth 2 (two) times daily. 09/19/21   Orson Eva, MD  rosuvastatin (CRESTOR) 10 MG tablet Take 1 tablet (10 mg total) by mouth at bedtime. 06/11/21   [name], DO  traZODone (DESYREL) 50 MG tablet Take 1 tablet (50 mg total) by mouth at bedtime as needed for sleep. 04/02/21   [name], DO  VITAMIN E PO Take 1 tablet by mouth daily.    [provider]    Physical Exam: Vitals:   10/06/21 1512 10/06/21 1523 10/06/21 1530 10/06/21 1600  BP: 125/74  112/65 133/81  Pulse: 99  98 94  Resp: '20   19 15  '$ Temp: 98.4 F (36.9 C) 98.4 F (36.9 C)    TempSrc: Oral Oral    SpO2: 95%  96% 96%  Weight: 90.3 kg     Height: '5\' 6"'$  (1.676 m)      Exam  Constitutional: Elderly female currently in no acute distress Eyes: PERRL, lids and conjunctivae normal ENMT: Mucous membranes are moist. Posterior pharynx clear of any exudate or lesions.  Neck: normal, supple, no masses, no thyromegaly Respiratory: Normal respiratory effort without significant wheezes or rhonchi appreciated. Cardiovascular: Regular rate and rhythm with +1/6 systolic murmur present.  +1 lower extremity edema present. Abdomen: no tenderness, no masses palpated. No hepatosplenomegaly. Bowel sounds positive.  Musculoskeletal: no clubbing / cyanosis. No joint deformity upper and lower extremities. Good ROM, no contractures.  Skin: no rashes, lesions, ulcers. No induration Neurologic: CN 2-12 grossly intact. Strength 5/5 in all 4.  Psychiatric: Normal judgment and insight. Alert and oriented x 3. Normal mood.   Data Reviewed:  EKG reveals sinus tachycardia at 101 bpm Assessment and Plan:  Acute blood loss anemia secondary to GI bleed history of GI bleed Patient presented with complaints having a large black bowel movement this morning.  She was noted to have a hemoglobin of 7.7 g/dL, but she had received 1 unit of PRBCs after hemoglobin noted to be down to 7.1 yesterday.  Stool guaiacs were noted to be positive today.  Thought likely to be upper GI bleed.  Recent EGD 6/26 and 7/5 along with capsule endoscopy without clear source of bleeding.  Records note that the patient previously underwent empiric coil embolization of the GAD and hypertrophied pancreaticoduodenal artery with IR on 12/05/2020.  The EGD on 12/11/2020 noted blood in duodenal bulb and second portion, one bleeding AVM s/p epi, but unsuccessful, s/p clips X 3 and hemospray.  Patient underwent coil embolization of inferior pancreaticoduodenal artery from SMA and  exoseal for hemostasis with IR on 12/13/2020.  Dr. Gala Romney of GI was consulted and we will follow along with the patient.  Patient was typed and screened and started on IV Protonix drip. -Admit to a telemetry bed -Clear liquid diet and n.p.o. after midnight -Continue Protonix drip -Serial H&H.  Transfuse blood products as needed. -GI consulted, will follow-up for any further recommendations  Essential hypertension Blood pressure maintained. -Held home blood pressure regimen at this time.  Determine when medically appropriate to resume  Diastolic dysfunction Last EF noted to be 60 to 65% with  grade 1 diastolic dysfunction with a severely dilated left atrium on 09/26/2021.  Chest x-ray from yesterday noted cardiomegaly with any acute abnormality.  BNP was noted to be elevated at 423.4 yesterday. -Continue to monitor  Hypocalcemia Acute calcium 8.6 on admission. -Give 1 g of calcium gluconate IV  Hyperlipidemia Home blood pressure regimen includes Crestor 10 mg nightly. -Continue Crestor  Obesity BMI 32.12 kg/m  DVT prophylaxis: SCDs Advance Care Planning:   Code Status: Prior   Consults: Gastroenterology  Family Communication:   Severity of Illness: The appropriate patient status for this patient is OBSERVATION. Observation status is judged to be reasonable and necessary in order to provide the required intensity of service to ensure the patient's safety. The patient's presenting symptoms, physical exam findings, and initial radiographic and laboratory data in the context of their medical condition is felt to place them at decreased risk for further clinical deterioration. Furthermore, it is anticipated that the patient will be medically stable for discharge from the hospital within 2 midnights of admission.   Author: Norval Morton, MD 10/06/2021 4:59 PM  For on call review [email or web address].

## 2021-10-06 NOTE — ED Provider Notes (Signed)
[facility] EMERGENCY DEPARTMENT Provider Note   CSN: [id number] Arrival date & time: 10/06/21  1452     History  Chief Complaint  Patient presents with   Rectal Bleeding    Stacey Reyes is a 76 y.o. female.  Patient with a history of upper GI bleed.  Patient seen in the [facility] emergency department yesterday had a hemoglobin of 7.1 and transfuse 1 unit of blood.  Patient went on to have a black bowel movement today that was large.  Patient was also admitted July 4 through July 6 at [facility] and had an upper endoscopy done at that time without any findings of bleeding.  They had her listed as cryptogenic GI bleeds no source.  However patient was also admitted the end of June could not find a source then.  But she was also admitted in September 2022 at that time they noted blood in the duodenal bulb and the second portion with bleeding AVM which had difficulty controlling and patient eventually underwent embolization.  Patient is not on any blood thinners.  Patient's blood pressure medicine was held recently.  Patient is taking Protonix.  Patient is on iron.  Past medical history significant for the history of the GI bleed hypertension hyperlipidemia history of diverticulitis.  Patient is followed by Dr. Abbey Chatters here locally.  And when she is admitted to [facility] she has been seen by Dr. Carlean Purl.  Patient denies any abdominal pain any nausea or vomiting.       Home Medications Prior to Admission medications   Medication Sig Start Date End Date Taking? Authorizing Provider  Ascorbic Acid (VITAMIN C PO) Take 1 tablet by mouth daily.   Yes [provider]  CALCIUM PO Take 1 tablet by mouth daily.   Yes [provider]  Cholecalciferol (VITAMIN D-3 PO) Take 1 capsule by mouth daily.   Yes [provider]  COPPER PO Take by mouth.   Yes [provider]  ferrous sulfate 325 (65 FE) MG tablet Take 1 tablet (325 mg total) by mouth daily. 09/27/21 09/27/22 Yes Patrecia Pour, MD  levocetirizine (XYZAL) 5 MG tablet TAKE 1 TABLET (5 MG TOTAL) BY MOUTH AT BEDTIME AS NEEDED FOR ALLERGIES (DRAINAGE). 09/03/21  Yes Gottschalk, Leatrice Jewels M, DO  MAGNESIUM PO Take 1 tablet by mouth daily.   Yes [provider]  pantoprazole (PROTONIX) 40 MG tablet Take 1 tablet (40 mg total) by mouth 2 (two) times daily. 09/19/21  Yes Tat, Shanon Brow, MD  rosuvastatin (CRESTOR) 10 MG tablet Take 1 tablet (10 mg total) by mouth at bedtime. 06/11/21  Yes Gottschalk, Leatrice Jewels M, DO  traZODone (DESYREL) 50 MG tablet Take 1 tablet (50 mg total) by mouth at bedtime as needed for sleep. 04/02/21  Yes Gottschalk, Leatrice Jewels M, DO  VITAMIN E PO Take 1 tablet by mouth daily.   Yes [provider]  hydrochlorothiazide (MICROZIDE) 12.5 MG capsule Take 1 capsule (12.5 mg total) by mouth daily. Patient not taking: Reported on 10/05/2021 06/11/21   [name], DO      Allergies    Sulfa antibiotics    Review of Systems   Review of Systems  Constitutional:  Negative for chills and fever.  HENT:  Negative for ear pain and sore throat.   Eyes:  Negative for pain and visual disturbance.  Respiratory:  Negative for cough and shortness of breath.   Cardiovascular:  Negative for chest pain and palpitations.  Gastrointestinal:  Positive for blood in  stool. Negative for abdominal pain and vomiting.  Genitourinary:  Negative for dysuria and hematuria.  Musculoskeletal:  Negative for arthralgias and back pain.  Skin:  Negative for color change and rash.  Neurological:  Negative for seizures and syncope.  Hematological:  Does not bruise/bleed easily.  All other systems reviewed and are negative.   Physical Exam Updated Vital Signs BP 133/81   Pulse 94   Temp 98.4 F (36.9 C) (Oral)   Resp 15   Ht 1.676 m ('5\' 6"'$ )   Wt 90.3 kg   SpO2 96%   BMI 32.12 kg/m  Physical Exam Vitals and nursing note reviewed.  Constitutional:      General: She is not in acute distress.    Appearance: Normal  appearance. She is well-developed. She is not ill-appearing.  HENT:     Head: Normocephalic and atraumatic.  Eyes:     Extraocular Movements: Extraocular movements intact.     Conjunctiva/sclera: Conjunctivae normal.     Pupils: Pupils are equal, round, and reactive to light.  Cardiovascular:     Rate and Rhythm: Normal rate and regular rhythm.     Heart sounds: No murmur heard. Pulmonary:     Effort: Pulmonary effort is normal. No respiratory distress.     Breath sounds: Normal breath sounds.  Abdominal:     General: There is no distension.     Palpations: Abdomen is soft.     Tenderness: There is no abdominal tenderness. There is no guarding.  Genitourinary:    Rectum: Guaiac result positive.     Comments: Black in color but goodly heme positive. Musculoskeletal:        General: No swelling.     Cervical back: Neck supple.  Skin:    General: Skin is warm and dry.     Capillary Refill: Capillary refill takes less than 2 seconds.     Coloration: Skin is pale.  Neurological:     General: No focal deficit present.     Mental Status: She is alert and oriented to person, place, and time.     Cranial Nerves: No cranial nerve deficit.     Sensory: No sensory deficit.     Motor: No weakness.  Psychiatric:        Mood and Affect: Mood normal.     ED Results / Procedures / Treatments   Labs (all labs ordered are listed, but only abnormal results are displayed) Labs Reviewed  COMPREHENSIVE METABOLIC PANEL - Abnormal; Notable for the following components:      Result Value   Glucose, Bld 150 (*)    Calcium 8.6 (*)    Total Protein 6.2 (*)    Alkaline Phosphatase 24 (*)    Anion gap 4 (*)    All other components within normal limits  CBC - Abnormal; Notable for the following components:   RBC 2.76 (*)    Hemoglobin 7.7 (*)    HCT 24.8 (*)    All other components within normal limits  POC OCCULT BLOOD, ED - Abnormal; Notable for the following components:   Fecal Occult Bld  POSITIVE (*)    All other components within normal limits  CBC  HEMOGLOBIN AND HEMATOCRIT, BLOOD  POC OCCULT BLOOD, ED  TYPE AND SCREEN    EKG EKG Interpretation  Date/Time:  Saturday October 06 2021 15:25:37 EDT Ventricular Rate:  101 PR Interval:  190 QRS Duration: 105 QT Interval:  362 QTC Calculation: 470 R Axis:   40 Text  Interpretation: Sinus tachycardia LVH with secondary repolarization abnormality Anterior infarct, old No significant change since last tracing Confirmed by Fredia Sorrow [phone]) on 10/06/2021 3:30:51 PM  Radiology DG Chest 2 View  Result Date: 10/05/2021 CLINICAL DATA:  76 year old female presenting for evaluation of shortness of breath. EXAM: CHEST - 2 VIEW COMPARISON:  April 18, 2021. FINDINGS: Trachea midline. Cardiomediastinal contours and hilar structures are stable. Mild cardiomegaly. No lobar consolidation.  No sign of pleural effusion. On limited assessment there is no acute skeletal process. IMPRESSION: Mild cardiomegaly without acute cardiopulmonary disease. Electronically Signed   By: Zetta Bills M.D.   On: 10/05/2021 12:20    Procedures Procedures    Medications Ordered in ED Medications  pantoprozole (PROTONIX) 80 mg /NS 100 mL infusion (8 mg/hr Intravenous New Bag/Given 10/06/21 1629)  pantoprazole (PROTONIX) injection 40 mg (has no administration in time range)  levocetirizine (XYZAL) tablet 5 mg (has no administration in time range)  rosuvastatin (CRESTOR) tablet 10 mg (has no administration in time range)  sodium chloride flush (NS) 0.9 % injection 3 mL (has no administration in time range)  acetaminophen (TYLENOL) tablet 650 mg (has no administration in time range)    Or  acetaminophen (TYLENOL) suppository 650 mg (has no administration in time range)  ondansetron (ZOFRAN) tablet 4 mg (has no administration in time range)    Or  ondansetron (ZOFRAN) injection 4 mg (has no administration in time range)  albuterol (PROVENTIL) (2.5  MG/3ML) 0.083% nebulizer solution 2.5 mg (has no administration in time range)  hydrALAZINE (APRESOLINE) injection 5 mg (has no administration in time range)  pantoprazole (PROTONIX) 80 mg /NS 100 mL IVPB (80 mg Intravenous New Bag/Given 10/06/21 1628)    ED Course/ Medical Decision Making/ A&P                           Medical Decision Making Amount and/or Complexity of Data Reviewed Labs: ordered.  Risk Decision regarding hospitalization.   CRITICAL CARE Performed by: Fredia Sorrow Total critical care time: 35 minutes Critical care time was exclusive of separately billable procedures and treating other patients. Critical care was necessary to treat or prevent imminent or life-threatening deterioration. Critical care was time spent personally by me on the following activities: development of treatment plan with patient and/or surrogate as well as nursing, discussions with consultants, evaluation of patient's response to treatment, examination of patient, obtaining history from patient or surrogate, ordering and performing treatments and interventions, ordering and review of laboratory studies, ordering and review of radiographic studies, pulse oximetry and re-evaluation of patient's condition.  Patient's hemoglobin here little bit reassuring 7.7.  It was 7.1 yesterday but did receive a unit of blood.  Patient with very black stool which could be due to the iron but it was markedly heme positive.  So there is definitely some blood involved with it.  Patient without any abdominal pain.  Patient's complete metabolic panel without significant abnormalities and a blood sugar of 150.  Patient without a leukocytosis and platelet count is 241.  EKG without acute changes.  Discussed with Dr. Gala Romney gastroenterology.  Recommending admission checking H&H's n.p.o. after midnight in case he decides to scope her in the morning.  Holding the iron for now.  Patient has been typed and screened but no blood  has been ordered.  Patient started on Protonix drip.  Hospitalist will admit.  Dr. Work will see patient in consultation.    Final Clinical Impression(s) /  ED Diagnoses Final diagnoses:  Gastrointestinal hemorrhage, unspecified gastrointestinal hemorrhage type    Rx / DC Orders ED Discharge Orders     None         Fredia Sorrow, MD 10/06/21 1720

## 2021-10-06 NOTE — H&P (Addendum)
History and Physical    Patient: Stacey Reyes:[id number] DOB: [birth year] DOA: 10/06/2021 DOS: the patient was seen and examined on 10/06/2021 PCP: Raliegh Ip, DO  Patient coming from: Home  Chief Complaint:  Chief Complaint  Patient presents with   Rectal Bleeding   HPI: Stacey Reyes is a 76 y.o. female with medical history significant of hypertension, hyperlipidemia, anemia, diverticulitis, colonic polyps, and GI bleed who presents with complaints of having dark black stool this morning.  She had been hospitalized 6/25-6/28 for upper GI bleed at [facility] where EGD revealed blood present in the duodenum in the bulb, second, and third portion of the duodenum without presence of active bleeding source. Thought to be a Dieulafoy lesion leading to her presentation.  Subsequently hospitalized 7/4-7/6 where hemoglobin was down to 6.2 g/dL up to 7.9 g/dL after she was transfused 2 units of packed red blood cells.  GI have been consulted, but no source noted on EGD and capsule endoscopy was negative.  Hemoglobin remained stable and she was discharged home yesterday patient reported having generalized malaise feeling weak and achy all over which is a usual sign that that her blood counts may be low. She had gone to [facility] to be evaluated and was found to have hemoglobin 7.1 g/dL and she was transfused 1 unit of packed red blood cells prior to being discharged home.  This morning patient reported waking up with some lower abdominal pain prior to having a large black liquid bowel movement.  She reports pain resolved after having the bowel movement and has not recurred.  The only other symptom that she reports is having some shortness of breath with exertion.  Denies having any recent NSAID use, fevers, cough, nausea, vomiting, dysuria, or recent sick contacts.  Upon admission into the emergency department patient was noted to be afebrile with 106/55-146/67 and all other vital  signs maintained.  Labs significant for hemoglobin 7.7, calcium 8.6, glucose 150, and glucose 150.  Patient has been typed and screened for possible need of blood products.  Patient was started on a Protonix drip.  Dr. Kendell Bane of gastroenterology was consulted and will see the patient.  Review of Systems: As mentioned in the history of present illness. All other systems reviewed and are negative. Past Medical History:  Diagnosis Date   Anemia    Blood transfusion without reported diagnosis    Cataract    removed bilateral   Chronic cystitis    Diverticulitis    Heart murmur    Hyperlipidemia    Hypertension    Personal history of colonic polyps-adenomas 01/07/2012   2009 - 2 diminutive adenomas (prior polyps also) 01/07/2012 - 2 diminutive adenomas     Past Surgical History:  Procedure Laterality Date   BREAST BIOPSY Right    No Scar seen    COLONOSCOPY  multiple   ENTEROSCOPY N/A 09/26/2021   Procedure: ENTEROSCOPY;  Surgeon: Iva Boop, MD;  Location: [facility] ENDOSCOPY;  Service: Gastroenterology;  Laterality: N/A;   ESOPHAGOGASTRODUODENOSCOPY (EGD) WITH PROPOFOL N/A 11/23/2019   Procedure: ESOPHAGOGASTRODUODENOSCOPY (EGD) WITH PROPOFOL;  Surgeon: Corbin Ade, MD;  Location: [facility] ENDO SUITE;  Service: Endoscopy;  Laterality: N/A;   ESOPHAGOGASTRODUODENOSCOPY (EGD) WITH PROPOFOL N/A 10/20/2020   Procedure: ESOPHAGOGASTRODUODENOSCOPY (EGD) WITH PROPOFOL;  Surgeon: Lanelle Bal, DO;  Location: [facility] ENDO SUITE;  Service: Endoscopy;  Laterality: N/A;   ESOPHAGOGASTRODUODENOSCOPY (EGD) WITH PROPOFOL N/A 12/05/2020   Procedure: ESOPHAGOGASTRODUODENOSCOPY (EGD) WITH PROPOFOL;  Surgeon:  Malissa Hippo, MD;  Location: [facility] ENDO SUITE;  Service: Endoscopy;  Laterality: N/A;  with enteroscopy   ESOPHAGOGASTRODUODENOSCOPY (EGD) WITH PROPOFOL N/A 12/11/2020   Procedure: ESOPHAGOGASTRODUODENOSCOPY (EGD) WITH PROPOFOL;  Surgeon: Lanelle Bal, DO;  Location: [facility];  Service: Endoscopy;   Laterality: N/A;   ESOPHAGOGASTRODUODENOSCOPY (EGD) WITH PROPOFOL N/A 09/17/2021   Procedure: ESOPHAGOGASTRODUODENOSCOPY (EGD) WITH PROPOFOL;  Surgeon: Lanelle Bal, DO;  Location: [facility];  Service: Endoscopy;  Laterality: N/A;   GIVENS CAPSULE STUDY N/A 11/23/2019   Procedure: GIVENS CAPSULE STUDY;  Surgeon: Corbin Ade, MD;  Location: [facility];  Service: Endoscopy;  Laterality: N/A;   GIVENS CAPSULE STUDY N/A 10/22/2020   Procedure: GIVENS CAPSULE STUDY;  Surgeon: Lanelle Bal, DO;  Location: [facility];  Service: Endoscopy;  Laterality: N/A;   GIVENS CAPSULE STUDY N/A 09/26/2021   Procedure: GIVENS CAPSULE STUDY;  Surgeon: Iva Boop, MD;  Location: [facility] ENDOSCOPY;  Service: Gastroenterology;  Laterality: N/A;   HEMOSTASIS CLIP PLACEMENT  12/11/2020   Procedure: HEMOSTASIS CLIP PLACEMENT;  Surgeon: Lanelle Bal, DO;  Location: [facility];  Service: Endoscopy;;   IR ANGIOGRAM SELECTIVE EACH ADDITIONAL VESSEL  12/05/2020   IR ANGIOGRAM SELECTIVE EACH ADDITIONAL VESSEL  12/05/2020   IR ANGIOGRAM SELECTIVE EACH ADDITIONAL VESSEL  12/05/2020   IR ANGIOGRAM SELECTIVE EACH ADDITIONAL VESSEL  12/13/2020   IR ANGIOGRAM SELECTIVE EACH ADDITIONAL VESSEL  12/13/2020   IR ANGIOGRAM VISCERAL SELECTIVE  12/05/2020   IR ANGIOGRAM VISCERAL SELECTIVE  12/13/2020   IR EMBO ART  VEN HEMORR LYMPH EXTRAV  INC GUIDE ROADMAPPING  12/05/2020   IR EMBO ART  VEN HEMORR LYMPH EXTRAV  INC GUIDE ROADMAPPING  12/13/2020   IR RADIOLOGIST EVAL & MGMT  01/11/2021   IR US GUIDE VASC ACCESS LEFT  12/13/2020   IR US GUIDE VASC ACCESS RIGHT  12/05/2020   SUBMUCOSAL TATTOO INJECTION  09/26/2021   Procedure: SUBMUCOSAL TATTOO INJECTION;  Surgeon: Iva Boop, MD;  Location: [facility] ENDOSCOPY;  Service: Gastroenterology;;   Social History:  reports that she quit smoking about 36 years ago. Her smoking use included cigarettes. She has never used smokeless tobacco. She reports current alcohol use. She reports  that she does not use drugs.  Allergies  Allergen Reactions   Sulfa Antibiotics Rash    Family History  Problem Relation Age of Onset   Colon cancer Mother 65       14's   Breast cancer Sister    Asthma Brother    Colon polyps Neg Hx    Esophageal cancer Neg Hx    Rectal cancer Neg Hx    Stomach cancer Neg Hx     Prior to Admission medications   Medication Sig Start Date End Date Taking? Authorizing Provider  Ascorbic Acid (VITAMIN C PO) Take 1 tablet by mouth daily.    [provider]  bisoprolol (ZEBETA) 5 MG tablet TAKE ONE-HALF TABLET BY  MOUTH DAILY Patient not taking: Reported on 10/05/2021 09/07/21   Rollene Rotunda, MD  CALCIUM PO Take 1 tablet by mouth daily.    [provider]  Cholecalciferol (VITAMIN D-3 PO) Take 1 capsule by mouth daily.    [provider]  ferrous sulfate 325 (65 FE) MG tablet Take 1 tablet (325 mg total) by mouth daily. 09/27/21 09/27/22  Tyrone Nine, MD  hydrochlorothiazide (MICROZIDE) 12.5 MG capsule Take 1 capsule (12.5 mg total) by mouth daily. Patient not taking: Reported  on 10/05/2021 06/11/21   Raliegh Ip, DO  levocetirizine (XYZAL) 5 MG tablet TAKE 1 TABLET (5 MG TOTAL) BY MOUTH AT BEDTIME AS NEEDED FOR ALLERGIES (DRAINAGE). 09/03/21   Raliegh Ip, DO  MAGNESIUM PO Take 1 tablet by mouth daily.    [provider]  pantoprazole (PROTONIX) 40 MG tablet Take 1 tablet (40 mg total) by mouth 2 (two) times daily. 09/19/21   Catarina Hartshorn, MD  rosuvastatin (CRESTOR) 10 MG tablet Take 1 tablet (10 mg total) by mouth at bedtime. 06/11/21   Raliegh Ip, DO  traZODone (DESYREL) 50 MG tablet Take 1 tablet (50 mg total) by mouth at bedtime as needed for sleep. 04/02/21   Raliegh Ip, DO  VITAMIN E PO Take 1 tablet by mouth daily.    [provider]    Physical Exam: Vitals:   10/06/21 1512 10/06/21 1523 10/06/21 1530 10/06/21 1600  BP: 125/74  112/65 133/81  Pulse: 99  98 94  Resp: 20   19 15   Temp: 98.4 F (36.9 C) 98.4 F (36.9 C)    TempSrc: Oral Oral    SpO2: 95%  96% 96%  Weight: 90.3 kg     Height: 5\' 6"  (1.676 m)      Exam  Constitutional: Elderly female currently in no acute distress Eyes: PERRL, lids and conjunctivae normal ENMT: Mucous membranes are moist. Posterior pharynx clear of any exudate or lesions.  Neck: normal, supple, no masses, no thyromegaly Respiratory: Normal respiratory effort without significant wheezes or rhonchi appreciated. Cardiovascular: Regular rate and rhythm with +2/6 systolic murmur present.  +1 lower extremity edema present. Abdomen: no tenderness, no masses palpated. No hepatosplenomegaly. Bowel sounds positive.  Musculoskeletal: no clubbing / cyanosis. No joint deformity upper and lower extremities. Good ROM, no contractures.  Skin: no rashes, lesions, ulcers. No induration Neurologic: CN 2-12 grossly intact. Strength 5/5 in all 4.  Psychiatric: Normal judgment and insight. Alert and oriented x 3. Normal mood.   Data Reviewed:  EKG reveals sinus tachycardia at 101 bpm Assessment and Plan:  Acute blood loss anemia secondary to GI bleed history of GI bleed Patient presented with complaints having a large black bowel movement this morning.  She was noted to have a hemoglobin of 7.7 g/dL, but she had received 1 unit of PRBCs after hemoglobin noted to be down to 7.1 yesterday.  Stool guaiacs were noted to be positive today.  Thought likely to be upper GI bleed.  Recent EGD 6/26 and 7/5 along with capsule endoscopy without clear source of bleeding.  Records note that the patient previously underwent empiric coil embolization of the GAD and hypertrophied pancreaticoduodenal artery with IR on 12/05/2020.  The EGD on 12/11/2020 noted blood in duodenal bulb and second portion, one bleeding AVM s/p epi, but unsuccessful, s/p clips X 3 and hemospray.  Patient underwent coil embolization of inferior pancreaticoduodenal artery from SMA and  exoseal for hemostasis with IR on 12/13/2020.  Dr. Jena Gauss of GI was consulted and we will follow along with the patient.  Patient was typed and screened and started on IV Protonix drip. -Admit to a telemetry bed -Clear liquid diet and n.p.o. after midnight -Continue Protonix drip -Serial H&H.  Transfuse blood products as needed. -GI consulted, will follow-up for any further recommendations  Essential hypertension Home medication regimen includes bisoprolol 2.5 mg daily and hydrochlorothiazide 12.5 mg daily.  These medications have been on hold due to bleeding. -Held home blood pressure regimen at this  time.  Determine when medically appropriate to resume  Diastolic dysfunction Last EF noted to be 60 to 65% with grade 1 diastolic dysfunction with a severely dilated left atrium on 09/26/2021.  Chest x-ray from yesterday noted cardiomegaly with any acute abnormality.  BNP was noted to be elevated at 423.4 yesterday. -Continue to monitor  Hypocalcemia Acute calcium 8.6 on admission. -Give 1 g of calcium gluconate IV  Hyperlipidemia Home blood pressure regimen includes Crestor 10 mg nightly. -Continue Crestor  Obesity BMI 32.12 kg/m  DVT prophylaxis: SCDs Advance Care Planning:   Code Status: Prior   Consults: Gastroenterology  Family Communication:   Severity of Illness: The appropriate patient status for this patient is OBSERVATION. Observation status is judged to be reasonable and necessary in order to provide the required intensity of service to ensure the patient's safety. The patient's presenting symptoms, physical exam findings, and initial radiographic and laboratory data in the context of their medical condition is felt to place them at decreased risk for further clinical deterioration. Furthermore, it is anticipated that the patient will be medically stable for discharge from the hospital within 2 midnights of admission.   Author: Clydie Braun, MD 10/06/2021 4:59 PM  For  on call review [email or web address].

## 2021-10-06 NOTE — ED Triage Notes (Signed)
Pt to the ED with complaints of black watery stool after having a pint of PRBC at [facility] yesterday.  Pt complaints of shortness of breath and symptomatic anemia.   Pt has an oxygen saturation of 99 percent on room air.

## 2021-10-07 ENCOUNTER — Encounter (HOSPITAL_COMMUNITY): Payer: Self-pay | Admitting: Internal Medicine

## 2021-10-07 ENCOUNTER — Encounter (HOSPITAL_COMMUNITY)
Admission: EM | Disposition: A | Discharge status: Home / Self Care | Payer: Self-pay | Source: Home / Self Care | Attending: Internal Medicine

## 2021-10-07 ENCOUNTER — Observation Stay (HOSPITAL_COMMUNITY): Payer: Medicare Other | Admitting: Anesthesiology

## 2021-10-07 DIAGNOSIS — Z79899 Other long term (current) drug therapy: Secondary | ICD-10-CM | POA: Diagnosis not present

## 2021-10-07 DIAGNOSIS — Z961 Presence of intraocular lens: Secondary | ICD-10-CM | POA: Diagnosis present

## 2021-10-07 DIAGNOSIS — E782 Mixed hyperlipidemia: Secondary | ICD-10-CM | POA: Diagnosis not present

## 2021-10-07 DIAGNOSIS — K317 Polyp of stomach and duodenum: Secondary | ICD-10-CM | POA: Diagnosis present

## 2021-10-07 DIAGNOSIS — Z9841 Cataract extraction status, right eye: Secondary | ICD-10-CM | POA: Diagnosis not present

## 2021-10-07 DIAGNOSIS — K31811 Angiodysplasia of stomach and duodenum with bleeding: Secondary | ICD-10-CM | POA: Diagnosis present

## 2021-10-07 DIAGNOSIS — D500 Iron deficiency anemia secondary to blood loss (chronic): Secondary | ICD-10-CM | POA: Diagnosis not present

## 2021-10-07 DIAGNOSIS — E669 Obesity, unspecified: Secondary | ICD-10-CM | POA: Diagnosis not present

## 2021-10-07 DIAGNOSIS — K921 Melena: Secondary | ICD-10-CM | POA: Diagnosis present

## 2021-10-07 DIAGNOSIS — R011 Cardiac murmur, unspecified: Secondary | ICD-10-CM | POA: Diagnosis present

## 2021-10-07 DIAGNOSIS — R000 Tachycardia, unspecified: Secondary | ICD-10-CM | POA: Diagnosis present

## 2021-10-07 DIAGNOSIS — Z882 Allergy status to sulfonamides status: Secondary | ICD-10-CM | POA: Diagnosis not present

## 2021-10-07 DIAGNOSIS — Z87891 Personal history of nicotine dependence: Secondary | ICD-10-CM | POA: Diagnosis not present

## 2021-10-07 DIAGNOSIS — I10 Essential (primary) hypertension: Secondary | ICD-10-CM | POA: Diagnosis not present

## 2021-10-07 DIAGNOSIS — Z6832 Body mass index (BMI) 32.0-32.9, adult: Secondary | ICD-10-CM | POA: Diagnosis not present

## 2021-10-07 DIAGNOSIS — N3020 Other chronic cystitis without hematuria: Secondary | ICD-10-CM | POA: Diagnosis present

## 2021-10-07 DIAGNOSIS — D62 Acute posthemorrhagic anemia: Secondary | ICD-10-CM | POA: Diagnosis not present

## 2021-10-07 DIAGNOSIS — Z86010 Personal history of colonic polyps: Secondary | ICD-10-CM | POA: Diagnosis not present

## 2021-10-07 DIAGNOSIS — Z9842 Cataract extraction status, left eye: Secondary | ICD-10-CM | POA: Diagnosis not present

## 2021-10-07 DIAGNOSIS — E785 Hyperlipidemia, unspecified: Secondary | ICD-10-CM | POA: Diagnosis present

## 2021-10-07 DIAGNOSIS — K922 Gastrointestinal hemorrhage, unspecified: Secondary | ICD-10-CM | POA: Diagnosis not present

## 2021-10-07 DIAGNOSIS — K449 Diaphragmatic hernia without obstruction or gangrene: Secondary | ICD-10-CM | POA: Diagnosis present

## 2021-10-07 HISTORY — PX: ESOPHAGOGASTRODUODENOSCOPY: SHX5428

## 2021-10-07 LAB — CBC
HCT: 25.9 % — ABNORMAL LOW (ref 36.0–46.0)
Hemoglobin: 8.1 g/dL — ABNORMAL LOW (ref 12.0–15.0)
MCH: 28.6 pg (ref 26.0–34.0)
MCHC: 31.3 g/dL (ref 30.0–36.0)
MCV: 91.5 fL (ref 80.0–100.0)
Platelets: 201 10*3/uL (ref 150–400)
RBC: 2.83 MIL/uL — ABNORMAL LOW (ref 3.87–5.11)
RDW: 14.6 % (ref 11.5–15.5)
WBC: 5.8 10*3/uL (ref 4.0–10.5)
nRBC: 0 % (ref 0.0–0.2)

## 2021-10-07 LAB — PREPARE RBC (CROSSMATCH)

## 2021-10-07 SURGERY — EGD (ESOPHAGOGASTRODUODENOSCOPY)
Anesthesia: General

## 2021-10-07 MED ORDER — PHENYLEPHRINE HCL (PRESSORS) 10 MG/ML IV SOLN
INTRAVENOUS | Status: DC | PRN
  Administered 2021-10-07: 160 mg via INTRAVENOUS

## 2021-10-07 MED ORDER — MAGNESIUM OXIDE -MG SUPPLEMENT 400 (240 MG) MG PO TABS
400.0000 mg | ORAL_TABLET | Freq: Every day | ORAL | Status: DC
  Administered 2021-10-07 – 2021-10-09 (×3): 400 mg via ORAL
  Filled 2021-10-07 (×3): qty 1

## 2021-10-07 MED ORDER — PROPOFOL 500 MG/50ML IV EMUL
INTRAVENOUS | Status: DC | PRN
  Administered 2021-10-07: 140 ug/kg/min via INTRAVENOUS

## 2021-10-07 MED ORDER — PROPOFOL 500 MG/50ML IV EMUL
INTRAVENOUS | Status: AC
  Filled 2021-10-07: qty 50

## 2021-10-07 MED ORDER — LACTATED RINGERS IV SOLN: INTRAVENOUS | Status: DC | PRN

## 2021-10-07 MED ORDER — SODIUM CHLORIDE 0.9 % IV SOLN: INTRAVENOUS | Status: DC

## 2021-10-07 MED ORDER — GLUCAGON HCL RDNA (DIAGNOSTIC) 1 MG IJ SOLR
INTRAMUSCULAR | Status: AC
  Filled 2021-10-07: qty 1

## 2021-10-07 MED ORDER — PROPOFOL 10 MG/ML IV BOLUS
INTRAVENOUS | Status: DC | PRN
  Administered 2021-10-07: 80 mg via INTRAVENOUS

## 2021-10-07 MED ORDER — SODIUM CHLORIDE 0.9% IV SOLUTION: Freq: Once | INTRAVENOUS | Status: AC

## 2021-10-07 MED ORDER — LIDOCAINE HCL (CARDIAC) PF 100 MG/5ML IV SOSY
PREFILLED_SYRINGE | INTRAVENOUS | Status: DC | PRN
  Administered 2021-10-07: 80 mg via INTRAVENOUS

## 2021-10-07 MED ORDER — PHENYLEPHRINE 80 MCG/ML (10ML) SYRINGE FOR IV PUSH (FOR BLOOD PRESSURE SUPPORT)
PREFILLED_SYRINGE | INTRAVENOUS | Status: AC
  Filled 2021-10-07: qty 10

## 2021-10-07 MED ORDER — GLUCAGON HCL RDNA (DIAGNOSTIC) 1 MG IJ SOLR
INTRAMUSCULAR | Status: DC | PRN
  Administered 2021-10-07: 1 mg via INTRAVENOUS

## 2021-10-07 MED ORDER — LIDOCAINE HCL (PF) 2 % IJ SOLN
INTRAMUSCULAR | Status: AC
  Filled 2021-10-07: qty 5

## 2021-10-07 MED ORDER — PROPOFOL 10 MG/ML IV BOLUS
INTRAVENOUS | Status: AC
  Filled 2021-10-07: qty 40

## 2021-10-07 NOTE — Assessment & Plan Note (Signed)
Corrected calcium 8.6 at the time of presentation -She was given IV calcium gluconate

## 2021-10-07 NOTE — Assessment & Plan Note (Signed)
Recurrent history of blood loss anemia with core embolization of GDA and pancreaticoduodenal artery as discussed above. Holding antiplatelet agents

## 2021-10-07 NOTE — Transfer of Care (Signed)
Immediate Anesthesia Transfer of Care Note  Patient: Stacey Reyes  Procedure(s) Performed: ESOPHAGOGASTRODUODENOSCOPY (EGD)  Patient Location: PACU  Anesthesia Type:General  Level of Consciousness: awake and alert   Airway & Oxygen Therapy: Patient Spontanous Breathing  Post-op Assessment: Report given to RN  Post vital signs: Reviewed and stable  Last Vitals:  Vitals Value Taken Time  BP 146/122 10/07/21 1145  Temp    Pulse 96 10/07/21 1146  Resp 16 10/07/21 1146  SpO2 99 % 10/07/21 1146  Vitals shown include unvalidated device data.  Last Pain:  Vitals:   10/07/21 1042  TempSrc: Oral  PainSc: 0-No pain      Patients Stated Pain Goal: 7 (59/74/71 8550)  Complications: No notable events documented.

## 2021-10-07 NOTE — Anesthesia Preprocedure Evaluation (Signed)
Anesthesia Evaluation  Patient identified by MRN, date of birth, ID band Patient awake    Reviewed: Allergy & Precautions, H&P , NPO status , Patient's Chart, lab work & pertinent test results, reviewed documented beta blocker date and time   Airway Mallampati: II  TM Distance: >3 FB Neck ROM: full    Dental no notable dental hx.    Pulmonary neg pulmonary ROS, former smoker,    Pulmonary exam normal breath sounds clear to auscultation       Cardiovascular Exercise Tolerance: Good hypertension, + Valvular Problems/Murmurs (murmur, asymptomatic) MR  Rhythm:regular Rate:Normal  IMPRESSIONS Left ventricular ejection fraction, by estimation, is 60 to 65%. The left ventricle has normal function. The left ventricle has no regional wall motion abnormalities. There is severe concentric left ventricular hypertrophy. Left ventricular diastolic parameters are consistent with Grade I diastolic dysfunction (impaired relaxation). 1. 2. Right ventricular systolic function is normal. The right ventricular size is normal. 3. Left atrial size was severely dilated. The mitral valve is normal in structure. Trivial mitral valve regurgitation. No evidence of mitral stenosis. 4. The aortic valve is normal in structure. Aortic valve regurgitation is not visualized. No aortic stenosis is present. 5. The inferior vena cava is normal in size with greater than 50% respiratory variability, suggesting right atrial pressure of 3 mmHg.   Neuro/Psych negative neurological ROS  negative psych ROS   GI/Hepatic negative GI ROS, Neg liver ROS,   Endo/Other  negative endocrine ROS  Renal/GU negative Renal ROS  negative genitourinary   Musculoskeletal negative musculoskeletal ROS (+)   Abdominal   Peds  Hematology negative hematology ROS (+) anemia ,   Anesthesia Other Findings   Reproductive/Obstetrics negative OB ROS                              Anesthesia Physical  Anesthesia Plan  ASA: 3 and emergent  Anesthesia Plan: General   Post-op Pain Management:    Induction:   PONV Risk Score and Plan: Propofol infusion and TIVA  Airway Management Planned: Nasal Cannula and Natural Airway  Additional Equipment:   Intra-op Plan:   Post-operative Plan:   Informed Consent: I have reviewed the patients History and Physical, chart, labs and discussed the procedure including the risks, benefits and alternatives for the proposed anesthesia with the patient or authorized representative who has indicated his/her understanding and acceptance.     Dental Advisory Given  Plan Discussed with: CRNA  Anesthesia Plan Comments:         Anesthesia Quick Evaluation

## 2021-10-07 NOTE — Consult Note (Signed)
Referring Provider: No ref. provider found Primary Care Physician:  [name], DO Primary Gastroenterologist:  Dr.Carver  Reason for Consultation: GI bleed   HPI: 76 year old lady with recurrent stuttering overt GI bleed of obscure etiology admitted to the hospital yesterday with melena and drop in her hemoglobin.  Melena on DRE.  She is remained hemodynamically stable has received 1 unit of blood overnight. Multiple episodes of black stool since admission. Hemoglobin 7.3 on admission yesterday 8.1 this morning.  Patient denies nausea vomiting abdominal pain or hematochezia.  No GERD.  No dysphagia  This lady has at least a 2-year history of stuttering GI bleed.  She has undergone multiple GI evaluations including EGD, capsule and colonoscopy.  Presented with GI bleeding (melena) last year - underwent multiple upper and endoscopies here.  Ultimately, Dieulafoy lesion identified between the third and fourth portion of the duodenum.  Endoscopic treatment not effective.  She underwent coil embolization of the inferior pancreaticoduodenal artery targeting mucosal blush at the level of clip placement.  Patient states from September of last year to June of this year she did well without passing any more dark stools until recently.  She has been admitted to [facility] seen down there in the ED couple times in the past month.  She saw Dr. Carlean Purl.  He performed the EGD/enteroscopy.  No bleeding site identified.  Capsule study performed which did not identify site of bleeding although prior tattoo identified along with a vascular structure.  Colonoscopy 2021 demonstrated multiple colonic adenomas and diverticulosis.  Aspirin stopped last year.  She is not anticoagulated.      Past Medical History:  Diagnosis Date   Anemia    Blood transfusion without reported diagnosis    Cataract    removed bilateral   Chronic cystitis    Diverticulitis    Heart murmur    Hyperlipidemia     Hypertension    Personal history of colonic polyps-adenomas 01/07/2012   2009 - 2 diminutive adenomas (prior polyps also) 01/07/2012 - 2 diminutive adenomas      Past Surgical History:  Procedure Laterality Date   BREAST BIOPSY Right    No Scar seen    COLONOSCOPY  multiple   ENTEROSCOPY N/A 09/26/2021   Procedure: ENTEROSCOPY;  Surgeon: Gatha Mayer, MD;  Location: [place];  Service: Gastroenterology;  Laterality: N/A;   ESOPHAGOGASTRODUODENOSCOPY (EGD) WITH PROPOFOL N/A 11/23/2019   Procedure: ESOPHAGOGASTRODUODENOSCOPY (EGD) WITH PROPOFOL;  Surgeon: Daneil Dolin, MD;  [facility];  Service: Endoscopy;  Laterality: N/A;   ESOPHAGOGASTRODUODENOSCOPY (EGD) WITH PROPOFOL N/A 10/20/2020   Procedure: ESOPHAGOGASTRODUODENOSCOPY (EGD) WITH PROPOFOL;  Surgeon: Eloise Harman, DO;  [facility];  Service: Endoscopy;  Laterality: N/A;   ESOPHAGOGASTRODUODENOSCOPY (EGD) WITH PROPOFOL N/A 12/05/2020   Procedure: ESOPHAGOGASTRODUODENOSCOPY (EGD) WITH PROPOFOL;  Surgeon: Rogene Houston, MD;  [facility];  Service: Endoscopy;  Laterality: N/A;  with enteroscopy   ESOPHAGOGASTRODUODENOSCOPY (EGD) WITH PROPOFOL N/A 12/11/2020   Procedure: ESOPHAGOGASTRODUODENOSCOPY (EGD) WITH PROPOFOL;  Surgeon: Eloise Harman, DO;  [facility];  Service: Endoscopy;  Laterality: N/A;   ESOPHAGOGASTRODUODENOSCOPY (EGD) WITH PROPOFOL N/A 09/17/2021   Procedure: ESOPHAGOGASTRODUODENOSCOPY (EGD) WITH PROPOFOL;  Surgeon: Eloise Harman, DO;  [facility];  Service: Endoscopy;  Laterality: N/A;   GIVENS CAPSULE STUDY N/A 11/23/2019   Procedure: GIVENS CAPSULE STUDY;  Surgeon: Daneil Dolin, MD;  [facility];  Service: Endoscopy;  Laterality: N/A;  GIVENS CAPSULE STUDY N/A 10/22/2020   Procedure: GIVENS CAPSULE STUDY;  Surgeon: Eloise Harman, DO;  [facility];  Service: Endoscopy;  Laterality: N/A;   GIVENS CAPSULE STUDY N/A  09/26/2021   Procedure: GIVENS CAPSULE STUDY;  Surgeon: Gatha Mayer, MD;  Location: [place];  Service: Gastroenterology;  Laterality: N/A;   HEMOSTASIS CLIP PLACEMENT  12/11/2020   Procedure: HEMOSTASIS CLIP PLACEMENT;  Surgeon: Eloise Harman, DO;  [facility];  Service: Endoscopy;;   IR ANGIOGRAM SELECTIVE EACH ADDITIONAL VESSEL  12/05/2020   IR ANGIOGRAM SELECTIVE EACH ADDITIONAL VESSEL  12/05/2020   IR ANGIOGRAM SELECTIVE EACH ADDITIONAL VESSEL  12/05/2020   IR ANGIOGRAM SELECTIVE EACH ADDITIONAL VESSEL  12/13/2020   IR ANGIOGRAM SELECTIVE EACH ADDITIONAL VESSEL  12/13/2020   IR ANGIOGRAM VISCERAL SELECTIVE  12/05/2020   IR ANGIOGRAM VISCERAL SELECTIVE  12/13/2020   IR EMBO ART  VEN HEMORR LYMPH EXTRAV  INC GUIDE ROADMAPPING  12/05/2020   IR EMBO ART  VEN HEMORR LYMPH EXTRAV  INC GUIDE ROADMAPPING  12/13/2020   IR RADIOLOGIST EVAL & MGMT  01/11/2021   IR US GUIDE VASC ACCESS LEFT  12/13/2020   IR US GUIDE VASC ACCESS RIGHT  12/05/2020   SUBMUCOSAL TATTOO INJECTION  09/26/2021   Procedure: SUBMUCOSAL TATTOO INJECTION;  Surgeon: Gatha Mayer, MD;  Location: [place];  Service: Gastroenterology;;    Prior to Admission medications   Medication Sig Start Date End Date Taking? Authorizing Provider  Ascorbic Acid (VITAMIN C PO) Take 1 tablet by mouth daily.   Yes [provider]  CALCIUM PO Take 1 tablet by mouth daily.   Yes [provider]  Cholecalciferol (VITAMIN D-3 PO) Take 1 capsule by mouth daily.   Yes [provider]  COPPER PO Take by mouth.   Yes [provider]  ferrous sulfate 325 (65 FE) MG tablet Take 1 tablet (325 mg total) by mouth daily. 09/27/21 09/27/22 Yes Patrecia Pour, MD  levocetirizine (XYZAL) 5 MG tablet TAKE 1 TABLET (5 MG TOTAL) BY MOUTH AT BEDTIME AS NEEDED FOR ALLERGIES (DRAINAGE). 09/03/21  Yes Gottschalk, Leatrice Jewels M, DO  MAGNESIUM PO Take 1 tablet by mouth daily.   Yes [provider]  pantoprazole  (PROTONIX) 40 MG tablet Take 1 tablet (40 mg total) by mouth 2 (two) times daily. 09/19/21  Yes Tat, Shanon Brow, MD  rosuvastatin (CRESTOR) 10 MG tablet Take 1 tablet (10 mg total) by mouth at bedtime. 06/11/21  Yes Gottschalk, Leatrice Jewels M, DO  traZODone (DESYREL) 50 MG tablet Take 1 tablet (50 mg total) by mouth at bedtime as needed for sleep. 04/02/21  Yes Gottschalk, Leatrice Jewels M, DO  VITAMIN E PO Take 1 tablet by mouth daily.   Yes [provider]  hydrochlorothiazide (MICROZIDE) 12.5 MG capsule Take 1 capsule (12.5 mg total) by mouth daily. Patient not taking: Reported on 10/05/2021 06/11/21   [name], DO    Current Facility-Administered Medications  Medication Dose Route Frequency Provider Last Rate Last Admin   acetaminophen (TYLENOL) tablet 650 mg  650 mg Oral Q6H PRN Norval Morton, MD       Or   acetaminophen (TYLENOL) suppository 650 mg  650 mg Rectal Q6H PRN Fuller Plan A, MD       albuterol (PROVENTIL) (2.5 MG/3ML) 0.083% nebulizer solution 2.5 mg  2.5 mg Nebulization Q6H PRN Smith, Rondell A, MD       hydrALAZINE (APRESOLINE) injection 5 mg  5 mg  Intravenous Q4H PRN Norval Morton, MD       levocetirizine (XYZAL) tablet 5 mg  5 mg Oral QHS PRN Fuller Plan A, MD       ondansetron (ZOFRAN) tablet 4 mg  4 mg Oral Q6H PRN Fuller Plan A, MD       Or   ondansetron (ZOFRAN) injection 4 mg  4 mg Intravenous Q6H PRN Norval Morton, MD       Oral care mouth rinse  15 mL Mouth Rinse PRN Norval Morton, MD       [START ON 10/10/2021] pantoprazole (PROTONIX) injection 40 mg  40 mg Intravenous Q12H Smith, Rondell A, MD       pantoprozole (PROTONIX) 80 mg /NS 100 mL infusion  8 mg/hr Intravenous Continuous Fuller Plan A, MD 10 mL/hr at 10/07/21 0211 8 mg/hr at 10/07/21 0211   rosuvastatin (CRESTOR) tablet 10 mg  10 mg Oral QHS Smith, Rondell A, MD   10 mg at 10/06/21 2244   sodium chloride flush (NS) 0.9 % injection 3 mL  3 mL Intravenous Q12H Smith, Rondell A, MD        traZODone (DESYREL) tablet 50 mg  50 mg Oral QHS PRN Fuller Plan A, MD   50 mg at 10/06/21 2244    Allergies as of 10/06/2021 - Review Complete 10/06/2021  Allergen Reaction Noted   Sulfa antibiotics Rash 04/29/2019    Family History  Problem Relation Age of Onset   Colon cancer Mother 71       80's   Breast cancer Sister    Asthma Brother    Colon polyps Neg Hx    Esophageal cancer Neg Hx    Rectal cancer Neg Hx    Stomach cancer Neg Hx     Social History   Socioeconomic History   Marital status: Married    Spouse name: Not on file   Number of children: 1   Years of education: Not on file   Highest education level: Not on file  Occupational History   Occupation: retired  Tobacco Use   Smoking status: Former    Types: Cigarettes    Quit date: 12/23/1984    Years since quitting: 36.8   Smokeless tobacco: Never  Vaping Use   Vaping Use: Never used  Substance and Sexual Activity   Alcohol use: Yes    Comment: wine occasionally   Drug use: No   Sexual activity: Not on file  Other Topics Concern   Not on file  Social History Narrative   Patient is married and retired and has 1 grown child   Social Determinants of Radio broadcast assistant Strain: Low Risk  (03/21/2021)   Overall Financial Resource Strain (CARDIA)    Difficulty of Paying Living Expenses: Not hard at all  Food Insecurity: No Food Insecurity (03/21/2021)   Hunger Vital Sign    Worried About Running Out of Food in the Last Year: Never true    Ran Out of Food in the Last Year: Never true  Transportation Needs: No Transportation Needs (03/21/2021)   PRAPARE - Hydrologist (Medical): No    Lack of Transportation (Non-Medical): No  Physical Activity: Sufficiently Active (03/21/2021)   Exercise Vital Sign    Days of Exercise per Week: 1 day    Minutes of Exercise per Session: 150+ min  Stress: No Stress Concern Present (03/21/2021)   [place]  Questionnaire    Feeling of Stress : Only a little  Social Connections: Moderately Integrated (03/21/2021)   Social Connection and Isolation Panel [NHANES]    Frequency of Communication with Friends and Family: More than three times a week    Frequency of Social Gatherings with Friends and Family: More than three times a week    Attends Religious Services: More than 4 times per year    Active Member of [employer] or Organizations: No    Attends Archivist Meetings: Never    Marital Status: Married  Human resources officer Violence: Not At Risk (03/21/2021)   Humiliation, Afraid, Rape, and Kick questionnaire    Fear of Current or Ex-Partner: No    Emotionally Abused: No    Physically Abused: No    Sexually Abused: No    Review of Systems:  As in HPI  Vital signs in last 24 hours: Temp:  [97.9 F (36.6 C)-98.6 F (37 C)] 98.1 F (36.7 C) (07/16 0610) Pulse Rate:  [79-99] 80 (07/16 0610) Resp:  [15-20] 18 (07/16 0610) BP: (111-154)/(57-81) 121/58 (07/16 0610) SpO2:  [95 %-99 %] 96 % (07/16 0610) Weight:  [90.3 kg-90.4 kg] 90.4 kg (07/15 1800) Last BM Date : 10/06/21 General:   Alert,  Well-developed, well-nourished, pleasant and cooperative in NAD zes, crackles, or rhonchi. No acute distress. Heart:  Regular rate and rhythm; no murmurs, clicks, rubs,  or gallops. Abdomen:  Soft, nontender and nondistended. No masses, hepatosplenomegaly or hernias noted. Normal bowel sounds, without guarding, and without rebound.   Rectal: Deferred; done by Dr. Rogene Houston   Intake/Output from previous day: 07/15 0701 - 07/16 0700 In: 565.2 [I.V.:100.2; Blood:315; IV Piggyback:150] Out: -  Intake/Output this shift: No intake/output data recorded.  Lab Results: Recent Labs    10/05/21 1205 10/06/21 1514 10/06/21 1904 10/07/21 0711  WBC 6.1 7.1  --  5.8  HGB 7.1* 7.7* 7.3* 8.1*  HCT 22.6* 24.8* 23.1* 25.9*  PLT 236 241  --  201   BMET Recent Labs     10/05/21 1205 10/06/21 1514  NA 138 137  K 4.0 3.7  CL 105 108  CO2 24 25  GLUCOSE 109* 150*  BUN 9 17  CREATININE 0.73 0.76  CALCIUM 9.0 8.6*   LFT Recent Labs    10/06/21 1514  PROT 6.2*  ALBUMIN 3.9  AST 21  ALT 14  ALKPHOS 24*  BILITOT 0.9   PT/INR Recent Labs    10/05/21 1205  LABPROT 14.3  INR 1.1    Impression: 76 year old lady with stuttering GI bleeding manifest as melena.  Dielafoy demonstrated in her duodenum yet last year.  Failed endoscopic therapy.  Status post IR embolization. Clinically, did well until the past couple of months when she is developed recurrent melena with drop in hemoglobin requiring transfusion.  She presents with compelling evidence for ongoing GI bleed. I suspect we are dealing with a persisting Dieulafoy. Recent EGD with enteroscopy/capsule study failed to demonstrate a suspect lesion.  Unfortunately, this is often the case with Dielafoys which remained occult unless they are found to be actively bleeding.  Recommendations:  I have offered the patient a repeat EGD with enteroscopy in an attempt to identify and treat the bleeding lesion.  Potential for repeat capsule study also discussed. Risk, benefits and alternatives have been reviewed.  No guarantees that I will be able to find the suspect lesion today.  Patient's questions have been answered she is agreeable.  Agree with continuing PPI for  now.  Further recommendations to follow.    Notice:  This dictation was prepared with Dragon dictation along with smaller phrase technology. Any transcriptional errors that result from this process are unintentional and may not be corrected upon review.

## 2021-10-07 NOTE — Op Note (Addendum)
[facility] Patient Name: Stacey Reyes Procedure Date: 10/07/2021 10:36 AM MRN: [id number] Date of Birth: Aug 12, 1945 Attending MD: Norvel Richards , MD CSN: [id number] Age: 76 Admit Type: Inpatient Procedure:                Upper GI endoscopy / enteroscopy with pediatric                            colonoscope Indications:              Melena Providers:                Norvel Richards, MD, Lurline Del, RN, Crystal                            Page Referring MD:              Medicines:                Propofol per Anesthesia Complications:            No immediate complications. Estimated Blood Loss:     Estimated blood loss was minimal. Procedure:                Pre-Anesthesia Assessment:                           - Prior to the procedure, a History and Physical                            was performed, and patient medications and                            allergies were reviewed. The patient's tolerance of                            previous anesthesia was also reviewed. The risks                            and benefits of the procedure and the sedation                            options and risks were discussed with the patient.                            All questions were answered, and informed consent                            was obtained. Prior Anticoagulants: The patient has                            taken no previous anticoagulant or antiplatelet                            agents. ASA Grade Assessment: III - A patient with                            severe  systemic disease. After reviewing the risks                            and benefits, the patient was deemed in                            satisfactory condition to undergo the procedure.                           After obtaining informed consent, the endoscope was                            passed under direct vision. Throughout the                            procedure, the patient's blood pressure, pulse,  and                            oxygen saturations were monitored continuously. The                            [id number]-HQ190L ([id number]) scope was introduced through                            the mouth, and advanced to the fourth part of                            duodenum. The upper GI endoscopy was accomplished                            without difficulty. The patient tolerated the                            procedure well. Scope In: 11:00:22 AM Scope Out: 11:34:24 AM Total Procedure Duration: 0 hours 34 minutes 2 seconds  Findings:      The examined esophagus was normal.      A small hiatal hernia was present. Multiple small gastric polyps;       otherwise, normal-appearing stomach. Patent pylorus. Examination of the       first, second, third and fourth portion of the duodenum was undertaken.       In the area of the fourth portion of the duodenum, actively bleeding       small bowel mucosa was identified. It was difficult to line up on this       lesion. Did not appear to be any mucosal excavation. Bleeding coming out       of otherwise normal appearing mucosa consistent with a Dieulafoy lesion.       I attempted to place 1 ultra clip but it would not disengage from the       deployment catheter and the clip ultimately fell off. Subsequently, I       deployed 4 more clips in the area of the bleeding mucosa. It appeared       that good hemostasis was achieved with these maneuvers. Impression:               -  Normal esophagus.                           - Small hiatal hernia. Gastric polyps.                           - Actively bleeding distal duodenal mucosa                            consistent with a Dieulafoy - status post                            hemostasis clip placement Moderate Sedation:      Moderate (conscious) sedation was personally administered by an       anesthesia professional. The following parameters were monitored: oxygen       saturation, heart rate, blood pressure,  respiratory rate, EKG, adequacy       of pulmonary ventilation, and response to care. Recommendation:           - Clear liquid diet. Trend H&H. If evidence of                            further bleeding, would refer to IR for additional                            coil placement. Clips placed with facilitate                            targeting by the interventional radiologist. Please                            note 4 clips placed; 1 clip fell off and is                            migrating downstream.                           - Continue present medications.                           - Return patient to hospital ward for observation.                           - Clear liquid diet.                           - Continue present medications. Procedure Code(s):        --- Professional ---                           [phone], Esophagogastroduodenoscopy, flexible,                            transoral; diagnostic, including collection of                            specimen(s)  by brushing or washing, when performed                            (separate procedure) Diagnosis Code(s):        --- Professional ---                           K44.9, Diaphragmatic hernia without obstruction or                            gangrene                           K92.1, Melena (includes Hematochezia) CPT copyright 2019 American Medical Association. All rights reserved. The codes documented in this report are preliminary and upon coder review may  be revised to meet current compliance requirements. Cristopher Estimable. Fiana Gladu, MD Norvel Richards, MD 10/07/2021 11:45:40 AM This report has been signed electronically. Number of Addenda: 0

## 2021-10-07 NOTE — Assessment & Plan Note (Signed)
BMI 32.17 -Left eye modification

## 2021-10-07 NOTE — Assessment & Plan Note (Signed)
Continue statin. 

## 2021-10-07 NOTE — Assessment & Plan Note (Signed)
Continue bisoprolol Holding HCTZ temporarily controlled

## 2021-10-07 NOTE — Anesthesia Postprocedure Evaluation (Signed)
Anesthesia Post Note  Patient: Stacey Reyes  Procedure(s) Performed: ESOPHAGOGASTRODUODENOSCOPY (EGD)  Patient location during evaluation: PACU Anesthesia Type: General Level of consciousness: awake and alert Pain management: pain level controlled Vital Signs Assessment: post-procedure vital signs reviewed and stable Respiratory status: spontaneous breathing, nonlabored ventilation, respiratory function stable and patient connected to nasal cannula oxygen Cardiovascular status: blood pressure returned to baseline and stable Postop Assessment: no apparent nausea or vomiting Anesthetic complications: no   No notable events documented.   Last Vitals:  Vitals:   10/07/21 1155 10/07/21 1200  BP: 91/81 95/64  Pulse: 89 89  Resp: 13 15  Temp:  37.1 C  SpO2: 99% 100%    Last Pain:  Vitals:   10/07/21 1200  TempSrc:   PainSc: 0-No pain                 Trixie Rude

## 2021-10-07 NOTE — Hospital Course (Signed)
76 year old female with history of blood loss anemia, diverticulitis, hypertension, hyperlipidemia, GI bleed presenting with melanotic stool.  The patient initially presented to the [facility] emergency department on 10/05/2021 with some generalized weakness, some shortness of breath.  She was noted to have a hemoglobin of 7.1.  She was transfused 1 unit PRBC and discharged home in stable condition.  [facility] was contacted and did not feel the patient needed to be admitted for further work-up.  Notably, the patient has a rather complex GI history.  She was most recently admitted to the hospital from 09/25/2021 to 09/27/2021 for melena.  At that time, she was noted to have hemoglobin of 6.2.  During the hospitalization she was transfused 2 units PRBC.  She experienced expressive aphasia during her transfusion for which she was transferred to [facility] for neurologic evaluation.  She was seen by neurology who thought it was unclear if her episode represented true TIA versus mild cognitive impairment.  Given her complicated GI bleed history and reassuring work-up, decision was made to withhold antiplatelet agents.  EEG was negative.  She was seen by GI and underwent EGD on 09/26/2021 which demonstrated multiple gastric polyps, normal small intestine to jejunum. VCE placed at that time. GI advised against antiplatelet given her recurrent GI bleeding episodes.  The VCE was reportedly negative.  Her hemoglobin 7.9 at the time of discharge.  She was discharged on oral iron and to continue PPI. In addition, she was also recently admitted from 09/16/2021 to 09/19/2021 for ABLA.  She underwent EGD on 09/17/2021 which showed blood in the duodenal bulb and the second and third portion of the duodenum but no presence of active bleeding source -it was considered that she had a Dieulafoy lesion leading to her initial presentation.  EGD Sept 13, 2022: normal GE junction, multiple gastric polyps, blood in second portion of duodenum  suspected secondary to Dieulafoy lesion.  IR 9/13: empiric percutaneous coil embolization of the GDA and a hypertrophied pancreaticoduodenal artery EGD 12/11/20: blood in duodenal bulb and second portion, one bleeding AVM s/p epi but unsuccessful, s/p clips X 3 and hemospray.  IR 12/13/20: coil embolization of inferior pancreaticoduodenal artery from SMA and exoseal for hemostasis   In the ED, the patient was afebrile hemodynamically stable.  WBC 7.1, hemoglobin 7.7, platelets 241,000.  Sodium 137, potassium 3.7, bicarbonate 25, serum creatinine 0.76.  LFTs were unremarkable.  FOBT was positive.  EKG shows sinus tachycardia with nonspecific ST changes.  GI was consulted to assist with management.

## 2021-10-07 NOTE — Progress Notes (Signed)
Patient completed blood transfusion at 0606 with no difficulties or adverse reactions. Notified lab of completion and to draw CBC in 1 hr.

## 2021-10-07 NOTE — Assessment & Plan Note (Addendum)
Patient presents with melena Hemoglobin 7.7 at the time presentation -Patient had hemoglobin of 9.2 on follow-up with PCP 10/02/2021 Continue Protonix drip Transfused 1 unit PRBC GI consulted Check iron

## 2021-10-07 NOTE — Progress Notes (Signed)
PROGRESS NOTE  Stacey Reyes DGU:[id number] DOB: 1945/08/12 DOA: 10/06/2021 PCP: [name], DO  Brief History:  76 year old female with history of blood loss anemia, diverticulitis, hypertension, hyperlipidemia, GI bleed presenting with melanotic stool.  The patient initially presented to the [facility] emergency department on 10/05/2021 with some generalized weakness, some shortness of breath.  She was noted to have a hemoglobin of 7.1.  She was transfused 1 unit PRBC and discharged home in stable condition.  [facility] was contacted and did not feel the patient needed to be admitted for further work-up.  Notably, the patient has a rather complex GI history.  She was most recently admitted to the hospital from 09/25/2021 to 09/27/2021 for melena.  At that time, she was noted to have hemoglobin of 6.2.  During the hospitalization she was transfused 2 units PRBC.  She experienced expressive aphasia during her transfusion for which she was transferred to [facility] for neurologic evaluation.  She was seen by neurology who thought it was unclear if her episode represented true TIA versus mild cognitive impairment.  Given her complicated GI bleed history and reassuring work-up, decision was made to withhold antiplatelet agents.  EEG was negative.  She was seen by GI and underwent EGD on 09/26/2021 which demonstrated multiple gastric polyps, normal small intestine to jejunum. VCE placed at that time. GI advised against antiplatelet given her recurrent GI bleeding episodes.  The VCE was reportedly negative.  Her hemoglobin 7.9 at the time of discharge.  She was discharged on oral iron and to continue PPI. In addition, she was also recently admitted from 09/16/2021 to 09/19/2021 for ABLA.  She underwent EGD on 09/17/2021 which showed blood in the duodenal bulb and the second and third portion of the duodenum but no presence of active bleeding source -it was considered that she had a Dieulafoy lesion  leading to her initial presentation.  EGD Sept 13, 2022: normal GE junction, multiple gastric polyps, blood in second portion of duodenum suspected secondary to Dieulafoy lesion.  IR 9/13: empiric percutaneous coil embolization of the GDA and a hypertrophied pancreaticoduodenal artery EGD 12/11/20: blood in duodenal bulb and second portion, one bleeding AVM s/p epi but unsuccessful, s/p clips X 3 and hemospray.  IR 12/13/20: coil embolization of inferior pancreaticoduodenal artery from SMA and exoseal for hemostasis   In the ED, the patient was afebrile hemodynamically stable.  WBC 7.1, hemoglobin 7.7, platelets 241,000.  Sodium 137, potassium 3.7, bicarbonate 25, serum creatinine 0.76.  LFTs were unremarkable.  FOBT was positive.  EKG shows sinus tachycardia with nonspecific ST changes.  GI was consulted to assist with management.     Assessment and Plan: * Acute blood loss anemia Patient presents with melena Hemoglobin 7.7 at the time presentation -Patient had hemoglobin of 9.2 on follow-up with PCP 10/02/2021 Continue Protonix drip Transfused 1 unit PRBC GI consulted Check iron  Hypertension, essential Continue bisoprolol Holding HCTZ temporarily  Hyperlipidemia Continue statin  History of GI bleed Recurrent history of blood loss anemia with core embolization of GDA and pancreaticoduodenal artery as discussed above. Holding antiplatelet agents  Obesity (BMI 30-39.9) BMI 32.17 -Left eye modification  Hypocalcemia Corrected calcium 8.6 at the time of presentation -She was given IV calcium gluconate        Family Communication:   no Family at bedside  Consultants:  GI  Code Status:  FULL   DVT Prophylaxis:  SCDs   Procedures:  As Listed in Progress Note Above  Antibiotics: None     Subjective: Patient denies fevers, chills, headache, chest pain, dyspnea, nausea, vomiting, diarrhea, abdominal pain, dysuria, hematuria, hematochezia,  She has had one  melanotic stool this am   Objective: Vitals:   10/07/21 0146 10/07/21 0242 10/07/21 0309 10/07/21 0610  BP: (!) 112/57 117/61 111/61 (!) 121/58  Pulse: 84 82 79 80  Resp: '19 18 18 18  '$ Temp: 98 F (36.7 C) 98 F (36.7 C) 98 F (36.7 C) 98.1 F (36.7 C)  TempSrc: Oral Oral Oral Oral  SpO2: 98% 97% 97% 96%  Weight:      Height:        Intake/Output Summary (Last 24 hours) at 10/07/2021 9476 Last data filed at 10/07/2021 0606 Gross per 24 hour  Intake 565.16 ml  Output --  Net 565.16 ml   Weight change:  Exam:  General:  Pt is alert, follows commands appropriately, not in acute distress HEENT: No icterus, No thrush, No neck mass, [place]/AT Cardiovascular: RRR, S1/S2, no rubs, no gallops Respiratory: CTA bilaterally, no wheezing, no crackles, no rhonchi Abdomen: Soft/+BS, non tender, non distended, no guarding Extremities: No edema, No lymphangitis, No petechiae, No rashes, no synovitis   Data Reviewed: I have personally reviewed following labs and imaging studies Basic Metabolic Panel: Recent Labs  Lab 10/05/21 1205 10/06/21 1514  NA 138 137  K 4.0 3.7  CL 105 108  CO2 24 25  GLUCOSE 109* 150*  BUN 9 17  CREATININE 0.73 0.76  CALCIUM 9.0 8.6*  MG 2.0  --    Liver Function Tests: Recent Labs  Lab 10/06/21 1514  AST 21  ALT 14  ALKPHOS 24*  BILITOT 0.9  PROT 6.2*  ALBUMIN 3.9   No results for input(s): "LIPASE", "AMYLASE" in the last 168 hours. No results for input(s): "AMMONIA" in the last 168 hours. Coagulation Profile: Recent Labs  Lab 10/05/21 1205  INR 1.1   CBC: Recent Labs  Lab 10/02/21 1013 10/05/21 0843 10/05/21 1205 10/06/21 1514 10/06/21 1904 10/07/21 0711  WBC 6.1 5.5 6.1 7.1  --  5.8  NEUTROABS 3.4 3.3 3.8  --   --   --   HGB 7.7* 7.1* 7.1* 7.7* 7.3* 8.1*  HCT 24.5* 22.6* 22.6* 24.8* 23.1* 25.9*  MCV 91 87 90.4 89.9  --  91.5  PLT 254 259 236 241  --  201   Cardiac Enzymes: Recent Labs  Lab 10/05/21 1205  CKTOTAL 62    BNP: Invalid input(s): "POCBNP" CBG: No results for input(s): "GLUCAP" in the last 168 hours. HbA1C: No results for input(s): "HGBA1C" in the last 72 hours. Urine analysis:    Component Value Date/Time   COLORURINE YELLOW 11/14/2019 0851   APPEARANCEUR Clear 10/19/2020 1215   LABSPEC 1.015 11/14/2019 0851   PHURINE 8.0 11/14/2019 0851   GLUCOSEU Negative 10/19/2020 1215   HGBUR NEGATIVE 11/14/2019 0851   BILIRUBINUR Negative 10/19/2020 [place] 11/14/2019 0851   PROTEINUR Negative 10/19/2020 1215   PROTEINUR NEGATIVE 11/14/2019 0851   UROBILINOGEN negative 02/24/2015 1344   NITRITE Negative 10/19/2020 1215   NITRITE NEGATIVE 11/14/2019 0851   LEUKOCYTESUR Negative 10/19/2020 1215   LEUKOCYTESUR TRACE (A) 11/14/2019 0851   Sepsis Labs: '@LABRCNTIP'$ (procalcitonin:4,lacticidven:4) )No results found for this or any previous visit (from the past 240 hour(s)).   Scheduled Meds:  [START ON 10/10/2021] pantoprazole  40 mg Intravenous Q12H   rosuvastatin  10 mg Oral QHS   sodium  chloride flush  3 mL Intravenous Q12H   Continuous Infusions:  pantoprazole 8 mg/hr (10/07/21 0211)    Procedures/Studies: DG Chest 2 View  Result Date: 10/05/2021 CLINICAL DATA:  76 year old female presenting for evaluation of shortness of breath. EXAM: CHEST - 2 VIEW COMPARISON:  April 18, 2021. FINDINGS: Trachea midline. Cardiomediastinal contours and hilar structures are stable. Mild cardiomegaly. No lobar consolidation.  No sign of pleural effusion. On limited assessment there is no acute skeletal process. IMPRESSION: Mild cardiomegaly without acute cardiopulmonary disease. Electronically Signed   By: Zetta Bills M.D.   On: 10/05/2021 12:20   EEG adult  Result Date: 09/27/2021 Lora Havens, MD     09/27/2021  8:41 AM Patient Name: Stacey Reyes MRN: [id number] Epilepsy Attending: Lora Havens Referring Physician/Provider: Janine Ores, NP Date: 09/26/2021 Duration: 25.01  mins Patient history: 76 year old female with transient word finding difficulty.  EEG to evaluate for seizure. Level of alertness: Awake AEDs during EEG study: None Technical aspects: This EEG study was done with scalp electrodes positioned according to the 10-20 International system of electrode placement. Electrical activity was acquired at a sampling rate of '500Hz'$  and reviewed with a high frequency filter of '70Hz'$  and a low frequency filter of '1Hz'$ . EEG data were recorded continuously and digitally stored. Description: The posterior dominant rhythm consists of 9-10 Hz activity of moderate voltage (25-35 uV) seen predominantly in posterior head regions, symmetric and reactive to eye opening and eye closing.  Hyperventilation and photic stimulation were not performed.   IMPRESSION: This study is within normal limits. No seizures or epileptiform discharges were seen throughout the recording. Lora Havens   ECHOCARDIOGRAM COMPLETE  Result Date: 09/26/2021    ECHOCARDIOGRAM REPORT   Patient Name:   ESMAE DONATHAN Date of Exam: 09/26/2021 Medical Rec #:  [id number]         Height:       66.0 in Accession #:    [id number]        Weight:       201.3 lb Date of Birth:  07-Nov-1945         BSA:          2.005 m Patient Age:    74 years          BP:           126/69 mmHg Patient Gender: F                 HR:           64 bpm. Exam Location:  Inpatient Procedure: 2D Echo, Cardiac Doppler and Color Doppler Indications:    TIA  History:        Patient has prior history of Echocardiogram examinations. Risk                 Factors:Hypertension.  Sonographer:    Jyl Heinz Referring Phys: [place]  1. Left ventricular ejection fraction, by estimation, is 60 to 65%. The left ventricle has normal function. The left ventricle has no regional wall motion abnormalities. There is severe concentric left ventricular hypertrophy. Left ventricular diastolic  parameters are consistent with Grade I diastolic  dysfunction (impaired relaxation).  2. Right ventricular systolic function is normal. The right ventricular size is normal.  3. Left atrial size was severely dilated.  4. The mitral valve is normal in structure. Trivial mitral valve regurgitation. No evidence of mitral stenosis.  5. The aortic valve is normal  in structure. Aortic valve regurgitation is not visualized. No aortic stenosis is present.  6. The inferior vena cava is normal in size with greater than 50% respiratory variability, suggesting right atrial pressure of 3 mmHg. FINDINGS  Left Ventricle: Left ventricular ejection fraction, by estimation, is 60 to 65%. The left ventricle has normal function. The left ventricle has no regional wall motion abnormalities. The left ventricular internal cavity size was normal in size. There is  severe concentric left ventricular hypertrophy. Left ventricular diastolic parameters are consistent with Grade I diastolic dysfunction (impaired relaxation). Right Ventricle: The right ventricular size is normal. No increase in right ventricular wall thickness. Right ventricular systolic function is normal. Left Atrium: Left atrial size was severely dilated. Right Atrium: Right atrial size was normal in size. Pericardium: There is no evidence of pericardial effusion. Presence of epicardial fat layer. Mitral Valve: The mitral valve is normal in structure. Mild mitral annular calcification. Trivial mitral valve regurgitation. No evidence of mitral valve stenosis. Tricuspid Valve: The tricuspid valve is normal in structure. Tricuspid valve regurgitation is trivial. No evidence of tricuspid stenosis. Aortic Valve: The aortic valve is normal in structure. Aortic valve regurgitation is not visualized. No aortic stenosis is present. Aortic valve mean gradient measures 14.0 mmHg. Aortic valve peak gradient measures 23.7 mmHg. Pulmonic Valve: The pulmonic valve was normal in structure. Pulmonic valve regurgitation is not visualized. No  evidence of pulmonic stenosis. Aorta: The aortic root is normal in size and structure. Venous: The inferior vena cava is normal in size with greater than 50% respiratory variability, suggesting right atrial pressure of 3 mmHg. IAS/Shunts: No atrial level shunt detected by color flow Doppler.  LEFT VENTRICLE PLAX 2D LVIDd:         3.90 cm      Diastology LVIDs:         2.20 cm      LV e' medial:    5.45 cm/s LV PW:         1.70 cm      LV E/e' medial:  16.8 LV IVS:        1.60 cm      LV e' lateral:   3.94 cm/s LVOT diam:     2.00 cm      LV E/e' lateral: 23.3 LVOT Area:     3.14 cm  LV Volumes (MOD) LV vol d, MOD A2C: 135.0 ml LV vol d, MOD A4C: 135.0 ml LV vol s, MOD A2C: 46.5 ml LV vol s, MOD A4C: 49.6 ml LV SV MOD A2C:     88.5 ml LV SV MOD A4C:     135.0 ml LV SV MOD BP:      86.2 ml RIGHT VENTRICLE             IVC RV Basal diam:  3.40 cm     IVC diam: 1.90 cm RV Mid diam:    3.00 cm RV S prime:     17.30 cm/s TAPSE (M-mode): 2.8 cm LEFT ATRIUM             Index        RIGHT ATRIUM           Index LA diam:        3.70 cm 1.85 cm/m   RA Area:     17.60 cm LA Vol (A2C):   94.1 ml 46.93 ml/m  RA Volume:   42.70 ml  21.29 ml/m LA Vol (A4C):   98.9 ml 49.32  ml/m LA Biplane Vol: 96.7 ml 48.22 ml/m  AORTIC VALVE AV Vmax:      243.33 cm/s AV Vmean:     183.667 cm/s AV VTI:       0.579 m AV Peak Grad: 23.7 mmHg AV Mean Grad: 14.0 mmHg  AORTA Ao Root diam: 3.40 cm Ao Asc diam:  3.20 cm MITRAL VALVE                TRICUSPID VALVE MV Area (PHT): 3.70 cm     TR Peak grad:   16.6 mmHg MV Decel Time: 205 msec     TR Vmax:        204.00 cm/s MV E velocity: 91.80 cm/s MV A velocity: 121.00 cm/s  SHUNTS MV E/A ratio:  0.76         Systemic Diam: 2.00 cm Kardie Tobb DO Electronically signed by Berniece Salines DO Signature Date/Time: 09/26/2021/2:51:11 PM    Final    MR BRAIN WO CONTRAST  Result Date: 09/25/2021 CLINICAL DATA:  TIA EXAM: MRI HEAD WITHOUT CONTRAST TECHNIQUE: Multiplanar, multiecho pulse sequences of the brain  and surrounding structures were obtained without intravenous contrast. COMPARISON:  Same-day CTA head/neck FINDINGS: Brain: There is no acute intracranial hemorrhage, extra-axial fluid collection, or acute infarct. Parenchymal volume is normal for age. The ventricles are normal in size. Gray-white differentiation is preserved. There are a few tiny foci of FLAIR signal abnormality in the subcortical and periventricular white matter likely reflecting minimal chronic white matter microangiopathy. There is no suspicious parenchymal signal abnormality. There is no mass lesion. There is no mass effect or midline shift. Vascular: Normal flow voids. Skull and upper cervical spine: Normal marrow signal. Sinuses/Orbits: The paranasal sinuses are clear. Bilateral lens implants are in place. The globes and orbits are otherwise unremarkable. Other: None. IMPRESSION: Normal for age brain MRI with no acute intracranial pathology. Electronically Signed   By: Valetta Mole M.D.   On: 09/25/2021 19:46   CT ANGIO HEAD NECK W WO CM  Result Date: 09/25/2021 CLINICAL DATA:  Acute neurologic deficit EXAM: CT ANGIOGRAPHY HEAD AND NECK TECHNIQUE: Multidetector CT imaging of the head and neck was performed using the standard protocol during bolus administration of intravenous contrast. Multiplanar CT image reconstructions and MIPs were obtained to evaluate the vascular anatomy. Carotid stenosis measurements (when applicable) are obtained utilizing NASCET criteria, using the distal internal carotid diameter as the denominator. RADIATION DOSE REDUCTION: This exam was performed according to the departmental dose-optimization program which includes automated exposure control, adjustment of the mA and/or kV according to patient size and/or use of iterative reconstruction technique. CONTRAST:  56m OMNIPAQUE IOHEXOL 350 MG/ML SOLN COMPARISON:  None Available. FINDINGS: CT HEAD FINDINGS Brain: There is no mass, hemorrhage or extra-axial  collection. The size and configuration of the ventricles and extra-axial CSF spaces are normal. There is no acute or chronic infarction. The brain parenchyma is normal. Skull: The visualized skull base, calvarium and extracranial soft tissues are normal. Sinuses/Orbits: No fluid levels or advanced mucosal thickening of the visualized paranasal sinuses. No mastoid or middle ear effusion. The orbits are normal. CTA NECK FINDINGS SKELETON: There is no bony spinal canal stenosis. No lytic or blastic lesion. OTHER NECK: Normal pharynx, larynx and major salivary glands. No cervical lymphadenopathy. Unremarkable thyroid gland. UPPER CHEST: No pneumothorax or pleural effusion. No nodules or masses. AORTIC ARCH: There is calcific atherosclerosis of the aortic arch. There is no aneurysm, dissection or hemodynamically significant stenosis of the visualized portion of  the aorta. Conventional 3 vessel aortic branching pattern. The visualized proximal subclavian arteries are widely patent. RIGHT CAROTID SYSTEM: Normal without aneurysm, dissection or stenosis. LEFT CAROTID SYSTEM: Normal without aneurysm, dissection or stenosis. VERTEBRAL ARTERIES: Left dominant configuration. Both origins are clearly patent. There is no dissection, occlusion or flow-limiting stenosis to the skull base (V1-V3 segments). CTA HEAD FINDINGS POSTERIOR CIRCULATION: --Vertebral arteries: Normal V4 segments. --Inferior cerebellar arteries: Normal. --Basilar artery: Normal. --Superior cerebellar arteries: Normal. --Posterior cerebral arteries (PCA): Normal. ANTERIOR CIRCULATION: --Intracranial internal carotid arteries: Normal. --Anterior cerebral arteries (ACA): Normal. Both A1 segments are present. Patent anterior communicating artery (a-comm). --Middle cerebral arteries (MCA): Normal. VENOUS SINUSES: As permitted by contrast timing, patent. ANATOMIC VARIANTS: None Review of the MIP images confirms the above findings. IMPRESSION: 1. No emergent large  vessel occlusion or high-grade stenosis of the intracranial or cervical arteries. 2. Aortic Atherosclerosis (ICD10-I70.0). Electronically Signed   By: Ulyses Jarred M.D.   On: 09/25/2021 19:25   DG Chest Port 1 View  Result Date: 09/16/2021 CLINICAL DATA:  Shortness of breath for 2 days EXAM: PORTABLE CHEST 1 VIEW COMPARISON:  12/11/2026 FINDINGS: Cardiac shadow is within normal limits. Aortic calcifications are seen. Lungs are clear bilaterally. Mild chronic interstitial changes are noted. No acute bony abnormality is seen. IMPRESSION: No active disease. Electronically Signed   By: Inez Catalina M.D.   On: 09/16/2021 19:05    Orson Eva, DO  [facility]  If 7PM-7AM, please contact night-coverage [email or web address] Password [facility] 10/07/2021, 8:33 AM   LOS: 0 days

## 2021-10-08 ENCOUNTER — Encounter (HOSPITAL_COMMUNITY): Payer: Self-pay | Admitting: Internal Medicine

## 2021-10-08 DIAGNOSIS — Z6830 Body mass index (BMI) 30.0-30.9, adult: Secondary | ICD-10-CM

## 2021-10-08 DIAGNOSIS — K922 Gastrointestinal hemorrhage, unspecified: Secondary | ICD-10-CM

## 2021-10-08 LAB — IRON AND TIBC
Iron: 13 ug/dL — ABNORMAL LOW (ref 28–170)
Saturation Ratios: 3 % — ABNORMAL LOW (ref 10.4–31.8)
TIBC: 377 ug/dL (ref 250–450)
UIBC: 364 ug/dL

## 2021-10-08 LAB — CBC
HCT: 21.9 % — ABNORMAL LOW (ref 36.0–46.0)
Hemoglobin: 6.9 g/dL — CL (ref 12.0–15.0)
MCH: 28.8 pg (ref 26.0–34.0)
MCHC: 31.5 g/dL (ref 30.0–36.0)
MCV: 91.3 fL (ref 80.0–100.0)
Platelets: 191 10*3/uL (ref 150–400)
RBC: 2.4 MIL/uL — ABNORMAL LOW (ref 3.87–5.11)
RDW: 14.5 % (ref 11.5–15.5)
WBC: 4.5 10*3/uL (ref 4.0–10.5)
nRBC: 0 % (ref 0.0–0.2)

## 2021-10-08 LAB — FERRITIN: Ferritin: 13 ng/mL (ref 11–307)

## 2021-10-08 LAB — HEMOGLOBIN AND HEMATOCRIT, BLOOD
HCT: 21.8 % — ABNORMAL LOW (ref 36.0–46.0)
Hemoglobin: 6.9 g/dL — CL (ref 12.0–15.0)

## 2021-10-08 LAB — PREPARE RBC (CROSSMATCH)

## 2021-10-08 MED ORDER — SODIUM CHLORIDE 0.9% IV SOLUTION: Freq: Once | INTRAVENOUS | Status: AC

## 2021-10-08 MED ORDER — PANTOPRAZOLE SODIUM 40 MG PO TBEC
40.0000 mg | DELAYED_RELEASE_TABLET | Freq: Every day | ORAL | Status: DC
  Administered 2021-10-09: 40 mg via ORAL
  Filled 2021-10-08: qty 1

## 2021-10-08 NOTE — Progress Notes (Signed)
Subjective: Hemoglobin down to 6.9 this morning from 8.1 yesterday (prior to EGD).  1 unit PRBCs has been ordered.  Patient reports she feels better this morning than she has in a while. Fatigue when getting up and walking around the room has resolved.  She has not had a bowel movement so far today.  Denies abdominal pain, nausea, vomiting.  She is tolerating clear liquid diet well.  Nursing staff in the room hanging unit of blood.   Objective: Vital signs in last 24 hours: Temp:  [98.1 F (36.7 C)-98.8 F (37.1 C)] 98.2 F (36.8 C) (07/16 1520) Pulse Rate:  [78-98] 78 (07/16 1520) Resp:  [13-25] 17 (07/16 1520) BP: (91-135)/(43-81) 135/58 (07/16 1520) SpO2:  [97 %-100 %] 98 % (07/16 1520) Last BM Date : 10/05/21 General:   Alert and oriented, pleasant Head:  Normocephalic and atraumatic. Abdomen:  Bowel sounds present, soft, non-tender, non-distended. No HSM or hernias noted. No rebound or guarding. No masses appreciated  Extremities:  Without edema. Psych:  Normal mood and affect.  Intake/Output from previous day: 07/16 0701 - 07/17 0700 In: 1280 [P.O.:480; I.V.:800] Out: -  Intake/Output this shift: No intake/output data recorded.  Lab Results: Recent Labs    10/06/21 1514 10/06/21 1904 10/07/21 0711 10/08/21 0514 10/08/21 0652  WBC 7.1  --  5.8 4.5  --   HGB 7.7*   < > 8.1* 6.9* 6.9*  HCT 24.8*   < > 25.9* 21.9* 21.8*  PLT 241  --  201 191  --    < > = values in this interval not displayed.   BMET Recent Labs    10/05/21 1205 10/06/21 1514  NA 138 137  K 4.0 3.7  CL 105 108  CO2 24 25  GLUCOSE 109* 150*  BUN 9 17  CREATININE 0.73 0.76  CALCIUM 9.0 8.6*   LFT Recent Labs    10/06/21 1514  PROT 6.2*  ALBUMIN 3.9  AST 21  ALT 14  ALKPHOS 24*  BILITOT 0.9   PT/INR Recent Labs    10/05/21 1205  LABPROT 14.3  INR 1.1     Assessment: 76 y.o. year old female with a history of IDA in setting of chronic GI bleeding, known AVMs, multiple  admissions requiring blood transfusions and multiple endoscopic procedures over past few years including multiple EGDs, colonoscopies, capsule studies, and 2 IR interventions in September 2022 for dieulafoy lesion. She had done well until the last couple of months where she has had recurrent melena and hospital admissions for acute symptomatic anemia requiring blood transfusions.  Last hospitalized in early July undergoing EGD/enteroscopy and capsule study with no suspect lesion identified.  Readmitted 7/15 with ongoing melena and symptomatic anemia.  She underwent EGD 7/16 revealing actively bleeding distal duodenal mucosa consistent with dieulafoy s/p placement of 4 hemostatic clips that 1 clip fell off.  Clinically, she is feeling improved today and denies any melanotic stools.  Noted hemoglobin declined to 6.9 this morning from 8.1 yesterday that this was prior to her EGD which did show active bleeding.  Suspect this decline may be secondary to prior active GI bleeding rather than ongoing GI bleeding.  She is receiving 1 unit PRBCs this morning.  Recommend monitoring for another 24 hours.  If evidence of recurrent/ongoing GI bleeding, she needs IR intervention.  Clips placed at the time of endoscopy will facilitate targeting by IR.  Notably, iron panel also consistent with IDA-ferritin 13, iron 13, saturation 3%.  Would  likely benefit from IV iron this admission. Oral iron may cloud the picture in regards to melena.    Plan: Agree with transfusing 1 unit PRBCs this morning. Would benefit from IV iron. Monitor H&H for another 24 hours.  If any further evidence of bleeding, she will need IR intervention.  Stop PPI infusion and transition to oral PPI daily.  Strict NSAID avoidance.   LOS: 1 day    10/08/2021, 9:35 AM   Aliene Altes, PA-C [facility]

## 2021-10-08 NOTE — Progress Notes (Signed)
PROGRESS NOTE  Stacey Reyes HCW:[id number] DOB: 1945/08/28 DOA: 10/06/2021 PCP: [name], DO  Brief History:  77 year old female with history of blood loss anemia, diverticulitis, hypertension, hyperlipidemia, GI bleed presenting with melanotic stool.  The patient initially presented to the [facility] emergency department on 10/05/2021 with some generalized weakness, some shortness of breath.  She was noted to have a hemoglobin of 7.1.  She was transfused 1 unit PRBC and discharged home in stable condition.  [facility] was contacted and did not feel the patient needed to be admitted for further work-up.  Notably, the patient has a rather complex GI history.  She was most recently admitted to the hospital from 09/25/2021 to 09/27/2021 for melena.  At that time, she was noted to have hemoglobin of 6.2.  During the hospitalization she was transfused 2 units PRBC.  She experienced expressive aphasia during her transfusion for which she was transferred to [facility] for neurologic evaluation.  She was seen by neurology who thought it was unclear if her episode represented true TIA versus mild cognitive impairment.  Given her complicated GI bleed history and reassuring work-up, decision was made to withhold antiplatelet agents.  EEG was negative.  She was seen by GI and underwent EGD on 09/26/2021 which demonstrated multiple gastric polyps, normal small intestine to jejunum. VCE placed at that time. GI advised against antiplatelet given her recurrent GI bleeding episodes.  The VCE was reportedly negative.  Her hemoglobin 7.9 at the time of discharge.  She was discharged on oral iron and to continue PPI. In addition, she was also recently admitted from 09/16/2021 to 09/19/2021 for ABLA.  She underwent EGD on 09/17/2021 which showed blood in the duodenal bulb and the second and third portion of the duodenum but no presence of active bleeding source -it was considered that she had a Dieulafoy lesion  leading to her initial presentation.  EGD Sept 13, 2022: normal GE junction, multiple gastric polyps, blood in second portion of duodenum suspected secondary to Dieulafoy lesion.  IR 9/13: empiric percutaneous coil embolization of the GDA and a hypertrophied pancreaticoduodenal artery EGD 12/11/20: blood in duodenal bulb and second portion, one bleeding AVM s/p epi but unsuccessful, s/p clips X 3 and hemospray.  IR 12/13/20: coil embolization of inferior pancreaticoduodenal artery from SMA and exoseal for hemostasis   In the ED, the patient was afebrile hemodynamically stable.  WBC 7.1, hemoglobin 7.7, platelets 241,000.  Sodium 137, potassium 3.7, bicarbonate 25, serum creatinine 0.76.  LFTs were unremarkable.  FOBT was positive.  EKG shows sinus tachycardia with nonspecific ST changes.  GI was consulted to assist with management.    Assessment and Plan: * Acute blood loss anemia Patient presents with melena Hemoglobin 7.7 at the time presentation -7/17 Hgb at 6.9 likely equilibration -Patient had hemoglobin of 9.2 on follow-up with PCP 10/02/2021 Continue Protonix drip Transfused 1 unit PRBC 7/16 Transfuse 1 unit PRBC 7/17 GI consult appreciated 7/16 EGD--active bleeding distal duodenal mucosa c/w Dieulafoy; 4 clips placed Iron satiration 3%, ferritin 3>>>transfuse nulecit 250  if no further procedures planned  Hypertension, essential Continue bisoprolol Holding HCTZ temporarily controlled  Hyperlipidemia Continue statin  History of GI bleed Recurrent history of blood loss anemia with core embolization of GDA and pancreaticoduodenal artery as discussed above. Holding antiplatelet agents  Obesity (BMI 30-39.9) BMI 32.17 -Left eye modification  Hypocalcemia Corrected calcium 8.6 at the time of presentation -She was given IV  calcium gluconate    Family Communication:   no Family at bedside   Consultants:  GI   Code Status:  FULL    DVT Prophylaxis:  SCDs      Procedures: As Listed in Progress Note Above   Antibiotics: None      Subjective: Patient denies fevers, chills, headache, chest pain, dyspnea, nausea, vomiting, diarrhea, abdominal pain, dysuria, hematuria, hematochezia, and melena.   Objective: Vitals:   10/08/21 0950 10/08/21 1024 10/08/21 1200 10/08/21 1331  BP: (!) 105/59 131/62 (!) 119/57 (!) 120/53  Pulse: 76 74 70 81  Resp: '16 17 16 18  '$ Temp: 98.4 F (36.9 C) 98 F (36.7 C) 98.5 F (36.9 C) 98.2 F (36.8 C)  TempSrc: Oral Oral Oral Oral  SpO2: 98%  98% 99%  Weight:      Height:        Intake/Output Summary (Last 24 hours) at 10/08/2021 1759 Last data filed at 10/08/2021 1500 Gross per 24 hour  Intake 1309.84 ml  Output --  Net 1309.84 ml   Weight change:  Exam:  General:  Pt is alert, follows commands appropriately, not in acute distress HEENT: No icterus, No thrush, No neck mass, [place]/AT Cardiovascular: RRR, S1/S2, no rubs, no gallops Respiratory: CTA bilaterally, no wheezing, no crackles, no rhonchi Abdomen: Soft/+BS, non tender, non distended, no guarding Extremities: No edema, No lymphangitis, No petechiae, No rashes, no synovitis   Data Reviewed: I have personally reviewed following labs and imaging studies Basic Metabolic Panel: Recent Labs  Lab 10/05/21 1205 10/06/21 1514  NA 138 137  K 4.0 3.7  CL 105 108  CO2 24 25  GLUCOSE 109* 150*  BUN 9 17  CREATININE 0.73 0.76  CALCIUM 9.0 8.6*  MG 2.0  --    Liver Function Tests: Recent Labs  Lab 10/06/21 1514  AST 21  ALT 14  ALKPHOS 24*  BILITOT 0.9  PROT 6.2*  ALBUMIN 3.9   No results for input(s): "LIPASE", "AMYLASE" in the last 168 hours. No results for input(s): "AMMONIA" in the last 168 hours. Coagulation Profile: Recent Labs  Lab 10/05/21 1205  INR 1.1   CBC: Recent Labs  Lab 10/02/21 1013 10/05/21 0843 10/05/21 1205 10/05/21 1205 10/06/21 1514 10/06/21 1904 10/07/21 0711 10/08/21 0514 10/08/21 0652  WBC 6.1  5.5 6.1  --  7.1  --  5.8 4.5  --   NEUTROABS 3.4 3.3 3.8  --   --   --   --   --   --   HGB 7.7* 7.1* 7.1*   < > 7.7* 7.3* 8.1* 6.9* 6.9*  HCT 24.5* 22.6* 22.6*   < > 24.8* 23.1* 25.9* 21.9* 21.8*  MCV 91 87 90.4  --  89.9  --  91.5 91.3  --   PLT 254 259 236  --  241  --  201 191  --    < > = values in this interval not displayed.   Cardiac Enzymes: Recent Labs  Lab 10/05/21 1205  CKTOTAL 62   BNP: Invalid input(s): "POCBNP" CBG: No results for input(s): "GLUCAP" in the last 168 hours. HbA1C: No results for input(s): "HGBA1C" in the last 72 hours. Urine analysis:    Component Value Date/Time   COLORURINE YELLOW 11/14/2019 0851   APPEARANCEUR Clear 10/19/2020 1215   LABSPEC 1.015 11/14/2019 0851   PHURINE 8.0 11/14/2019 0851   GLUCOSEU Negative 10/19/2020 1215   HGBUR NEGATIVE 11/14/2019 0851   BILIRUBINUR Negative 10/19/2020 1215  KETONESUR NEGATIVE 11/14/2019 0851   PROTEINUR Negative 10/19/2020 1215   PROTEINUR NEGATIVE 11/14/2019 0851   UROBILINOGEN negative 02/24/2015 1344   NITRITE Negative 10/19/2020 1215   NITRITE NEGATIVE 11/14/2019 0851   LEUKOCYTESUR Negative 10/19/2020 1215   LEUKOCYTESUR TRACE (A) 11/14/2019 0851   Sepsis Labs: '@LABRCNTIP'$ (procalcitonin:4,lacticidven:4) )No results found for this or any previous visit (from the past 240 hour(s)).   Scheduled Meds:  magnesium oxide  400 mg Oral Daily   [START ON 10/09/2021] pantoprazole  40 mg Oral Daily   rosuvastatin  10 mg Oral QHS   sodium chloride flush  3 mL Intravenous Q12H   Continuous Infusions:  Procedures/Studies: DG Chest 2 View  Result Date: 10/05/2021 CLINICAL DATA:  76 year old female presenting for evaluation of shortness of breath. EXAM: CHEST - 2 VIEW COMPARISON:  April 18, 2021. FINDINGS: Trachea midline. Cardiomediastinal contours and hilar structures are stable. Mild cardiomegaly. No lobar consolidation.  No sign of pleural effusion. On limited assessment there is no acute  skeletal process. IMPRESSION: Mild cardiomegaly without acute cardiopulmonary disease. Electronically Signed   By: Zetta Bills M.D.   On: 10/05/2021 12:20   EEG adult  Result Date: 09/27/2021 Lora Havens, MD     09/27/2021  8:41 AM Patient Name: Stacey Reyes MRN: [id number] Epilepsy Attending: Lora Havens Referring Physician/Provider: Janine Ores, NP Date: 09/26/2021 Duration: 25.01 mins Patient history: 76 year old female with transient word finding difficulty.  EEG to evaluate for seizure. Level of alertness: Awake AEDs during EEG study: None Technical aspects: This EEG study was done with scalp electrodes positioned according to the 10-20 International system of electrode placement. Electrical activity was acquired at a sampling rate of '500Hz'$  and reviewed with a high frequency filter of '70Hz'$  and a low frequency filter of '1Hz'$ . EEG data were recorded continuously and digitally stored. Description: The posterior dominant rhythm consists of 9-10 Hz activity of moderate voltage (25-35 uV) seen predominantly in posterior head regions, symmetric and reactive to eye opening and eye closing.  Hyperventilation and photic stimulation were not performed.   IMPRESSION: This study is within normal limits. No seizures or epileptiform discharges were seen throughout the recording. Lora Havens   ECHOCARDIOGRAM COMPLETE  Result Date: 09/26/2021    ECHOCARDIOGRAM REPORT   Patient Name:   Stacey Reyes Date of Exam: 09/26/2021 Medical Rec #:  [id number]         Height:       66.0 in Accession #:    [id number]        Weight:       201.3 lb Date of Birth:  03/27/1945         BSA:          2.005 m Patient Age:    37 years          BP:           126/69 mmHg Patient Gender: F                 HR:           64 bpm. Exam Location:  Inpatient Procedure: 2D Echo, Cardiac Doppler and Color Doppler Indications:    TIA  History:        Patient has prior history of Echocardiogram examinations. Risk                  Factors:Hypertension.  Sonographer:    Jyl Heinz Referring Phys: Perry Hall  1. Left  ventricular ejection fraction, by estimation, is 60 to 65%. The left ventricle has normal function. The left ventricle has no regional wall motion abnormalities. There is severe concentric left ventricular hypertrophy. Left ventricular diastolic  parameters are consistent with Grade I diastolic dysfunction (impaired relaxation).  2. Right ventricular systolic function is normal. The right ventricular size is normal.  3. Left atrial size was severely dilated.  4. The mitral valve is normal in structure. Trivial mitral valve regurgitation. No evidence of mitral stenosis.  5. The aortic valve is normal in structure. Aortic valve regurgitation is not visualized. No aortic stenosis is present.  6. The inferior vena cava is normal in size with greater than 50% respiratory variability, suggesting right atrial pressure of 3 mmHg. FINDINGS  Left Ventricle: Left ventricular ejection fraction, by estimation, is 60 to 65%. The left ventricle has normal function. The left ventricle has no regional wall motion abnormalities. The left ventricular internal cavity size was normal in size. There is  severe concentric left ventricular hypertrophy. Left ventricular diastolic parameters are consistent with Grade I diastolic dysfunction (impaired relaxation). Right Ventricle: The right ventricular size is normal. No increase in right ventricular wall thickness. Right ventricular systolic function is normal. Left Atrium: Left atrial size was severely dilated. Right Atrium: Right atrial size was normal in size. Pericardium: There is no evidence of pericardial effusion. Presence of epicardial fat layer. Mitral Valve: The mitral valve is normal in structure. Mild mitral annular calcification. Trivial mitral valve regurgitation. No evidence of mitral valve stenosis. Tricuspid Valve: The tricuspid valve is normal in structure.  Tricuspid valve regurgitation is trivial. No evidence of tricuspid stenosis. Aortic Valve: The aortic valve is normal in structure. Aortic valve regurgitation is not visualized. No aortic stenosis is present. Aortic valve mean gradient measures 14.0 mmHg. Aortic valve peak gradient measures 23.7 mmHg. Pulmonic Valve: The pulmonic valve was normal in structure. Pulmonic valve regurgitation is not visualized. No evidence of pulmonic stenosis. Aorta: The aortic root is normal in size and structure. Venous: The inferior vena cava is normal in size with greater than 50% respiratory variability, suggesting right atrial pressure of 3 mmHg. IAS/Shunts: No atrial level shunt detected by color flow Doppler.  LEFT VENTRICLE PLAX 2D LVIDd:         3.90 cm      Diastology LVIDs:         2.20 cm      LV e' medial:    5.45 cm/s LV PW:         1.70 cm      LV E/e' medial:  16.8 LV IVS:        1.60 cm      LV e' lateral:   3.94 cm/s LVOT diam:     2.00 cm      LV E/e' lateral: 23.3 LVOT Area:     3.14 cm  LV Volumes (MOD) LV vol d, MOD A2C: 135.0 ml LV vol d, MOD A4C: 135.0 ml LV vol s, MOD A2C: 46.5 ml LV vol s, MOD A4C: 49.6 ml LV SV MOD A2C:     88.5 ml LV SV MOD A4C:     135.0 ml LV SV MOD BP:      86.2 ml RIGHT VENTRICLE             IVC RV Basal diam:  3.40 cm     IVC diam: 1.90 cm RV Mid diam:    3.00 cm RV S prime:  17.30 cm/s TAPSE (M-mode): 2.8 cm LEFT ATRIUM             Index        RIGHT ATRIUM           Index LA diam:        3.70 cm 1.85 cm/m   RA Area:     17.60 cm LA Vol (A2C):   94.1 ml 46.93 ml/m  RA Volume:   42.70 ml  21.29 ml/m LA Vol (A4C):   98.9 ml 49.32 ml/m LA Biplane Vol: 96.7 ml 48.22 ml/m  AORTIC VALVE AV Vmax:      243.33 cm/s AV Vmean:     183.667 cm/s AV VTI:       0.579 m AV Peak Grad: 23.7 mmHg AV Mean Grad: 14.0 mmHg  AORTA Ao Root diam: 3.40 cm Ao Asc diam:  3.20 cm MITRAL VALVE                TRICUSPID VALVE MV Area (PHT): 3.70 cm     TR Peak grad:   16.6 mmHg MV Decel Time: 205 msec      TR Vmax:        204.00 cm/s MV E velocity: 91.80 cm/s MV A velocity: 121.00 cm/s  SHUNTS MV E/A ratio:  0.76         Systemic Diam: 2.00 cm Kardie Tobb DO Electronically signed by Berniece Salines DO Signature Date/Time: 09/26/2021/2:51:11 PM    Final    MR BRAIN WO CONTRAST  Result Date: 09/25/2021 CLINICAL DATA:  TIA EXAM: MRI HEAD WITHOUT CONTRAST TECHNIQUE: Multiplanar, multiecho pulse sequences of the brain and surrounding structures were obtained without intravenous contrast. COMPARISON:  Same-day CTA head/neck FINDINGS: Brain: There is no acute intracranial hemorrhage, extra-axial fluid collection, or acute infarct. Parenchymal volume is normal for age. The ventricles are normal in size. Gray-white differentiation is preserved. There are a few tiny foci of FLAIR signal abnormality in the subcortical and periventricular white matter likely reflecting minimal chronic white matter microangiopathy. There is no suspicious parenchymal signal abnormality. There is no mass lesion. There is no mass effect or midline shift. Vascular: Normal flow voids. Skull and upper cervical spine: Normal marrow signal. Sinuses/Orbits: The paranasal sinuses are clear. Bilateral lens implants are in place. The globes and orbits are otherwise unremarkable. Other: None. IMPRESSION: Normal for age brain MRI with no acute intracranial pathology. Electronically Signed   By: Valetta Mole M.D.   On: 09/25/2021 19:46   CT ANGIO HEAD NECK W WO CM  Result Date: 09/25/2021 CLINICAL DATA:  Acute neurologic deficit EXAM: CT ANGIOGRAPHY HEAD AND NECK TECHNIQUE: Multidetector CT imaging of the head and neck was performed using the standard protocol during bolus administration of intravenous contrast. Multiplanar CT image reconstructions and MIPs were obtained to evaluate the vascular anatomy. Carotid stenosis measurements (when applicable) are obtained utilizing NASCET criteria, using the distal internal carotid diameter as the denominator. RADIATION  DOSE REDUCTION: This exam was performed according to the departmental dose-optimization program which includes automated exposure control, adjustment of the mA and/or kV according to patient size and/or use of iterative reconstruction technique. CONTRAST:  43m OMNIPAQUE IOHEXOL 350 MG/ML SOLN COMPARISON:  None Available. FINDINGS: CT HEAD FINDINGS Brain: There is no mass, hemorrhage or extra-axial collection. The size and configuration of the ventricles and extra-axial CSF spaces are normal. There is no acute or chronic infarction. The brain parenchyma is normal. Skull: The visualized skull base, calvarium and extracranial soft tissues  are normal. Sinuses/Orbits: No fluid levels or advanced mucosal thickening of the visualized paranasal sinuses. No mastoid or middle ear effusion. The orbits are normal. CTA NECK FINDINGS SKELETON: There is no bony spinal canal stenosis. No lytic or blastic lesion. OTHER NECK: Normal pharynx, larynx and major salivary glands. No cervical lymphadenopathy. Unremarkable thyroid gland. UPPER CHEST: No pneumothorax or pleural effusion. No nodules or masses. AORTIC ARCH: There is calcific atherosclerosis of the aortic arch. There is no aneurysm, dissection or hemodynamically significant stenosis of the visualized portion of the aorta. Conventional 3 vessel aortic branching pattern. The visualized proximal subclavian arteries are widely patent. RIGHT CAROTID SYSTEM: Normal without aneurysm, dissection or stenosis. LEFT CAROTID SYSTEM: Normal without aneurysm, dissection or stenosis. VERTEBRAL ARTERIES: Left dominant configuration. Both origins are clearly patent. There is no dissection, occlusion or flow-limiting stenosis to the skull base (V1-V3 segments). CTA HEAD FINDINGS POSTERIOR CIRCULATION: --Vertebral arteries: Normal V4 segments. --Inferior cerebellar arteries: Normal. --Basilar artery: Normal. --Superior cerebellar arteries: Normal. --Posterior cerebral arteries (PCA): Normal.  ANTERIOR CIRCULATION: --Intracranial internal carotid arteries: Normal. --Anterior cerebral arteries (ACA): Normal. Both A1 segments are present. Patent anterior communicating artery (a-comm). --Middle cerebral arteries (MCA): Normal. VENOUS SINUSES: As permitted by contrast timing, patent. ANATOMIC VARIANTS: None Review of the MIP images confirms the above findings. IMPRESSION: 1. No emergent large vessel occlusion or high-grade stenosis of the intracranial or cervical arteries. 2. Aortic Atherosclerosis (ICD10-I70.0). Electronically Signed   By: Ulyses Jarred M.D.   On: 09/25/2021 19:25   DG Chest Port 1 View  Result Date: 09/16/2021 CLINICAL DATA:  Shortness of breath for 2 days EXAM: PORTABLE CHEST 1 VIEW COMPARISON:  12/11/2026 FINDINGS: Cardiac shadow is within normal limits. Aortic calcifications are seen. Lungs are clear bilaterally. Mild chronic interstitial changes are noted. No acute bony abnormality is seen. IMPRESSION: No active disease. Electronically Signed   By: Inez Catalina M.D.   On: 09/16/2021 19:05    Orson Eva, DO  [facility]  If 7PM-7AM, please contact night-coverage [email or web address] Password [email or web address] 10/08/2021, 5:59 PM   LOS: 1 day

## 2021-10-08 NOTE — TOC Progression Note (Signed)
  Transition of Care [facility]) Screening Note   Patient Details  Name: Stacey Reyes Date of Birth: May 14, 1945   Transition of Care [facility]) CM/SW Contact:    Boneta Lucks, RN Phone Number: 10/08/2021, 3:00 PM  Patient getting blood, possible DC tomorrow. TOC to follow and assess for needs.   Transition of Care Department [facility]) has reviewed patient and no TOC needs have been identified at this time. We will continue to monitor patient advancement through interdisciplinary progression rounds. If new patient transition needs arise, please place a TOC consult.       Barriers to Discharge: Continued Medical Work up

## 2021-10-09 ENCOUNTER — Other Ambulatory Visit: Payer: Self-pay | Admitting: *Deleted

## 2021-10-09 ENCOUNTER — Encounter: Payer: Self-pay | Admitting: Internal Medicine

## 2021-10-09 ENCOUNTER — Inpatient Hospital Stay (HOSPITAL_COMMUNITY): Payer: Medicare Other

## 2021-10-09 ENCOUNTER — Telehealth: Payer: Self-pay | Admitting: Gastroenterology

## 2021-10-09 ENCOUNTER — Ambulatory Visit: Payer: Medicare Other | Admitting: Family Medicine

## 2021-10-09 DIAGNOSIS — D500 Iron deficiency anemia secondary to blood loss (chronic): Secondary | ICD-10-CM

## 2021-10-09 DIAGNOSIS — D649 Anemia, unspecified: Secondary | ICD-10-CM

## 2021-10-09 LAB — TYPE AND SCREEN
ABO/RH(D): O POS
Antibody Screen: NEGATIVE
Unit division: 0
Unit division: 0

## 2021-10-09 LAB — BPAM RBC
Blood Product Expiration Date: 202308242359
Blood Product Expiration Date: 202308242359
ISSUE DATE / TIME: 202307160245
ISSUE DATE / TIME: 202307171001
Unit Type and Rh: 5100
Unit Type and Rh: 5100

## 2021-10-09 LAB — CBC
HCT: 25 % — ABNORMAL LOW (ref 36.0–46.0)
Hemoglobin: 7.9 g/dL — ABNORMAL LOW (ref 12.0–15.0)
MCH: 27.7 pg (ref 26.0–34.0)
MCHC: 31.6 g/dL (ref 30.0–36.0)
MCV: 87.7 fL (ref 80.0–100.0)
Platelets: 184 10*3/uL (ref 150–400)
RBC: 2.85 MIL/uL — ABNORMAL LOW (ref 3.87–5.11)
RDW: 15.7 % — ABNORMAL HIGH (ref 11.5–15.5)
WBC: 4.6 10*3/uL (ref 4.0–10.5)
nRBC: 0 % (ref 0.0–0.2)

## 2021-10-09 MED ORDER — SODIUM CHLORIDE 0.9 % IV SOLN
250.0000 mg | Freq: Once | INTRAVENOUS | Status: AC
  Administered 2021-10-09: 250 mg via INTRAVENOUS
  Filled 2021-10-09: qty 20

## 2021-10-09 NOTE — Progress Notes (Signed)
Patient took medications with no issue and slept the whole shift. The patient had an 8 beat run of non-sustained V-tach. Check on patient, she was sleeping. Doctor was notified. Continued to monitor patient.

## 2021-10-09 NOTE — Telephone Encounter (Signed)
Courtney: Please arrange CBC in 1 week through [facility]. Dx: Anemia  Stacey: Please arrange hospital follow-up in 4 weeks. Dx: Anemia, GI bleed

## 2021-10-09 NOTE — TOC Transition Note (Signed)
Transition of Care [facility]) - CM/SW Discharge Note   Patient Details  Name: Stacey Reyes MRN: [id number] Date of Birth: 11-18-1945  [facility]) CM/SW Contact:  Boneta Lucks, RN Phone Number: 10/09/2021, 1:15 PM   Clinical Narrative:   Patient discharging home. Has a high risk for readmission. Patient lives at home with her spouse. Independent at baseline, drives herself to appointments. No need identified.   Final next level of care: Home/Self Care Barriers to Discharge: No Barriers Identified   Patient Goals and CMS Choice Patient states their goals for this hospitalization and ongoing recovery are:: to go home. CMS Medicare.gov Compare Post Acute Care list provided to:: Patient Choice offered to / list presented to : Patient  Discharge Placement          Patient and family notified of of transfer: 10/09/21       Readmission Risk Interventions    10/09/2021    1:14 PM  Readmission Risk Prevention Plan  Transportation Screening Complete  PCP or Specialist Appt within 3-5 Days Not Complete  HRI or [place] Complete  Social Work Consult for [place] Planning/Counseling Complete  Palliative Care Screening Not Applicable  Medication Review [employer]) Complete

## 2021-10-09 NOTE — Care Management Important Message (Signed)
Important Message  Patient Details  Name: Stacey Reyes MRN: [id number] Date of Birth: January 25, 1946   Medicare Important Message Given:  Yes     Tommy Medal 10/09/2021, 12:30 PM

## 2021-10-09 NOTE — Plan of Care (Signed)

## 2021-10-09 NOTE — Consult Note (Signed)
   [facility] CM Inpatient Consult   10/09/2021  Stacey Reyes 09/29/1945 [id number]  *Remote review coverage for readmission less than 30 days, patient admitted to [facility] [[facility]] Patient: [employer]  Primary Care Provider:  [name], DO, with [facility], is an embedded provider with a Chronic Care Management team and program, and is listed for the transition of care follow up and appointments.  Patient was screened for readmission with high risk score for unplanned readmission score.  Plan: Provider listed for the [facility] follow up for post hospital needs, upcoming appointments noted. No additional needs noted at this time.  Please contact for further questions,  Natividad Brood, RN BSN [facility] Liaison  [phone] business mobile phone Toll free office [phone]  Fax number: [phone] [email or web address]

## 2021-10-09 NOTE — Telephone Encounter (Signed)
Labs entered into Epic  °

## 2021-10-09 NOTE — Progress Notes (Signed)
Subjective: Feels well this morning. Had a black stool yesterday. None today. No nausea, vomiting, abdominal pain. Walked up and down the hallway and feels well. Ready to go home.   Objective: Vital signs in last 24 hours: Temp:  [98 F (36.7 C)-98.5 F (36.9 C)] 98.2 F (36.8 C) (07/18 0522) Pulse Rate:  [64-81] 81 (07/18 0522) Resp:  [16-18] 18 (07/18 0522) BP: (119-137)/(53-67) 137/67 (07/18 0522) SpO2:  [97 %-99 %] 97 % (07/18 0522) Last BM Date : 10/05/21 General:   Alert and oriented, pleasant Head:  Normocephalic and atraumatic. Eyes:  No icterus, sclera clear. Conjuctiva pink.  Abdomen:  Bowel sounds present, soft, non-tender, non-distended. No HSM or hernias noted. No rebound or guarding. No masses appreciated  Msk:  Symmetrical without gross deformities. Normal posture. Extremities:  Without edema. Neurologic:  Alert and  oriented x4;  grossly normal neurologically. Psych:  Normal mood and affect.  Intake/Output from previous day: 07/17 0701 - 07/18 0700 In: 1789.8 [P.O.:1440; I.V.:23.2; Blood:326.7] Out: -  Intake/Output this shift: No intake/output data recorded.  Lab Results: Recent Labs    10/07/21 0711 10/08/21 0514 10/08/21 0652 10/09/21 0543  WBC 5.8 4.5  --  4.6  HGB 8.1* 6.9* 6.9* 7.9*  HCT 25.9* 21.9* 21.8* 25.0*  PLT 201 191  --  184   BMET Recent Labs    10/06/21 1514  NA 137  K 3.7  CL 108  CO2 25  GLUCOSE 150*  BUN 17  CREATININE 0.76  CALCIUM 8.6*   LFT Recent Labs    10/06/21 1514  PROT 6.2*  ALBUMIN 3.9  AST 21  ALT 14  ALKPHOS 24*  BILITOT 0.9    Assessment: 76 y.o. year old female with a history of IDA in setting of chronic GI bleeding, known AVMs, multiple admissions requiring blood transfusions and multiple endoscopic procedures over past few years including multiple EGDs, colonoscopies, capsule studies, and 2 IR interventions in September 2022 for dieulafoy lesion. She had done well until the last couple of  months where she has had recurrent melena and hospital admissions for acute symptomatic anemia requiring blood transfusions.  Last hospitalized in early July undergoing EGD/enteroscopy and capsule study with no suspect lesion identified.  Readmitted 7/15 with ongoing melena and symptomatic anemia.  She underwent EGD 7/16 revealing actively bleeding distal duodenal mucosa consistent with dieulafoy s/p placement of 4 hemostatic clips that 1 clip fell off.  Hemoglobin declined to 6.9 yesterday, likely secondary to blood loss prior to endoscopic intervention.  She received 1 unit PRBCs yesterday with hemoglobin improved to 7.9 this morning.  Reports single episode of melanotic stool yesterday, likely washout.  No further overt GI bleeding.  Clinically feeling very well.  Notably, iron panel is consistent with IDA, ferritin 13, iron 13, saturation 3%.  She would likely benefit from IV iron prior to discharge.  Could resume oral iron at about 1 week after he has had some time to monitor her stools for resolution of melena.  Patient is requesting to follow-up with our office.    Plan: Advance to soft diet.  Recommend IV iron prior to discharge.  Resume oral iron in about 1 week to allow for washout and monitoring of recurrent melena.  Continue PPI daily. Strict NSAID avoidance.  Appropriate for discharge today. Patient will monitor for ongoing melena and let us know if this occurs. She is aware to return to the ED if she has any significant melena or return of fatigue/weakness.  If any further evidence of bleeding, she will need IR intervention. We will repeat CBC in 1 week and plan to follow-up in the office in about 4 weeks.    LOS: 2 days    10/09/2021, 9:53 AM   Aliene Altes, PA-C [facility]

## 2021-10-09 NOTE — Discharge Summary (Signed)
Physician Discharge Summary   Patient: Stacey Reyes MRN: [id number] DOB: Jan 22, 1946  Admit date:     10/06/2021  Discharge date: 10/09/21  Discharge Physician: Shanon Brow Javar Eshbach   PCP: [name], DO   Recommendations at discharge:   Please follow up with primary care provider within 1-2 weeks  Please repeat BMP and CBC in one week     Hospital Course: 76 year old female with history of blood loss anemia, diverticulitis, hypertension, hyperlipidemia, GI bleed presenting with melanotic stool.  The patient initially presented to the [facility] emergency department on 10/05/2021 with some generalized weakness, some shortness of breath.  She was noted to have a hemoglobin of 7.1.  She was transfused 1 unit PRBC and discharged home in stable condition.  [facility] was contacted and did not feel the patient needed to be admitted for further work-up.  Notably, the patient has a rather complex GI history.  She was most recently admitted to the hospital from 09/25/2021 to 09/27/2021 for melena.  At that time, she was noted to have hemoglobin of 6.2.  During the hospitalization she was transfused 2 units PRBC.  She experienced expressive aphasia during her transfusion for which she was transferred to [facility] for neurologic evaluation.  She was seen by neurology who thought it was unclear if her episode represented true TIA versus mild cognitive impairment.  Given her complicated GI bleed history and reassuring work-up, decision was made to withhold antiplatelet agents.  EEG was negative.  She was seen by GI and underwent EGD on 09/26/2021 which demonstrated multiple gastric polyps, normal small intestine to jejunum. VCE placed at that time. GI advised against antiplatelet given her recurrent GI bleeding episodes.  The VCE was reportedly negative.  Her hemoglobin 7.9 at the time of discharge.  She was discharged on oral iron and to continue PPI. In addition, she was also recently admitted from 09/16/2021 to  09/19/2021 for ABLA.  She underwent EGD on 09/17/2021 which showed blood in the duodenal bulb and the second and third portion of the duodenum but no presence of active bleeding source -it was considered that she had a Dieulafoy lesion leading to her initial presentation.  EGD Sept 13, 2022: normal GE junction, multiple gastric polyps, blood in second portion of duodenum suspected secondary to Dieulafoy lesion.  IR 9/13: empiric percutaneous coil embolization of the GDA and a hypertrophied pancreaticoduodenal artery EGD 12/11/20: blood in duodenal bulb and second portion, one bleeding AVM s/p epi but unsuccessful, s/p clips X 3 and hemospray.  IR 12/13/20: coil embolization of inferior pancreaticoduodenal artery from SMA and exoseal for hemostasis   In the ED, the patient was afebrile hemodynamically stable.  WBC 7.1, hemoglobin 7.7, platelets 241,000.  Sodium 137, potassium 3.7, bicarbonate 25, serum creatinine 0.76.  LFTs were unremarkable.  FOBT was positive.  EKG shows sinus tachycardia with nonspecific ST changes.  GI was consulted to assist with management.  Assessment and Plan: * Acute blood loss anemia Patient presents with melena Hemoglobin 7.7 at the time presentation -7/17 Hgb at 6.9 likely equilibration -Patient had hemoglobin of 9.2 on follow-up with PCP 10/02/2021 Continue Protonix drip Transfused 1 unit PRBC 7/16 Transfuse 1 unit PRBC 7/17 GI consult appreciated 7/16 EGD--active bleeding distal duodenal mucosa c/w Dieulafoy; 4 clips placed Iron satiration 3%, ferritin 3>>>transfuse nulecit 250  if no further procedures planned Hgb 7.9 on day of d/c--discussed with GI--ok to d/c home -pt given nulecit 250 mg x 1 prior to dc  Hypertension,  essential Continue bisoprolol Holding HCTZ temporarily controlled  Hyperlipidemia Continue statin  History of GI bleed Recurrent history of blood loss anemia with core embolization of GDA and pancreaticoduodenal artery as discussed  above. Holding antiplatelet agents  Obesity (BMI 30-39.9) BMI 32.17 -Left eye modification  Hypocalcemia Corrected calcium 8.6 at the time of presentation -She was given IV calcium gluconate         Consultants: GI Procedures performed: EGD as above  Disposition: Home Diet recommendation:  Cardiac diet DISCHARGE MEDICATION: Allergies as of 10/09/2021       Reactions   Sulfa Antibiotics Rash        Medication List     STOP taking these medications    hydrochlorothiazide 12.5 MG capsule Commonly known as: MICROZIDE       TAKE these medications    CALCIUM PO Take 1 tablet by mouth daily.   COPPER PO Take by mouth.   ferrous sulfate 325 (65 FE) MG tablet Take 1 tablet (325 mg total) by mouth daily.   levocetirizine 5 MG tablet Commonly known as: XYZAL TAKE 1 TABLET (5 MG TOTAL) BY MOUTH AT BEDTIME AS NEEDED FOR ALLERGIES (DRAINAGE).   MAGNESIUM PO Take 1 tablet by mouth daily.   pantoprazole 40 MG tablet Commonly known as: PROTONIX Take 1 tablet (40 mg total) by mouth 2 (two) times daily.   rosuvastatin 10 MG tablet Commonly known as: CRESTOR Take 1 tablet (10 mg total) by mouth at bedtime.   traZODone 50 MG tablet Commonly known as: DESYREL Take 1 tablet (50 mg total) by mouth at bedtime as needed for sleep.   VITAMIN C PO Take 1 tablet by mouth daily.   VITAMIN D-3 PO Take 1 capsule by mouth daily.   VITAMIN E PO Take 1 tablet by mouth daily.        Discharge Exam: Filed Weights   10/06/21 1512 10/06/21 1800  Weight: 90.3 kg 90.4 kg   HEENT:  [place]/AT, No thrush, no icterus CV:  RRR, no rub, no S3, no S4 Lung:  CTA, no wheeze, no rhonchi Abd:  soft/+BS, NT Ext:  No edema, no lymphangitis, no synovitis, no rash   Condition at discharge: stable  The results of significant diagnostics from this hospitalization (including imaging, microbiology, ancillary and laboratory) are listed below for reference.   Imaging Studies: DG  Chest 2 View  Result Date: 10/05/2021 CLINICAL DATA:  76 year old female presenting for evaluation of shortness of breath. EXAM: CHEST - 2 VIEW COMPARISON:  April 18, 2021. FINDINGS: Trachea midline. Cardiomediastinal contours and hilar structures are stable. Mild cardiomegaly. No lobar consolidation.  No sign of pleural effusion. On limited assessment there is no acute skeletal process. IMPRESSION: Mild cardiomegaly without acute cardiopulmonary disease. Electronically Signed   By: Zetta Bills M.D.   On: 10/05/2021 12:20   EEG adult  Result Date: 09/27/2021 Lora Havens, MD     09/27/2021  8:41 AM Patient Name: Stacey Reyes MRN: [id number] Epilepsy Attending: Lora Havens Referring Physician/Provider: Janine Ores, NP Date: 09/26/2021 Duration: 25.01 mins Patient history: 76 year old female with transient word finding difficulty.  EEG to evaluate for seizure. Level of alertness: Awake AEDs during EEG study: None Technical aspects: This EEG study was done with scalp electrodes positioned according to the 10-20 International system of electrode placement. Electrical activity was acquired at a sampling rate of '500Hz'$  and reviewed with a high frequency filter of '70Hz'$  and a low frequency filter of '1Hz'$ . EEG data were recorded  continuously and digitally stored. Description: The posterior dominant rhythm consists of 9-10 Hz activity of moderate voltage (25-35 uV) seen predominantly in posterior head regions, symmetric and reactive to eye opening and eye closing.  Hyperventilation and photic stimulation were not performed.   IMPRESSION: This study is within normal limits. No seizures or epileptiform discharges were seen throughout the recording. Lora Havens   ECHOCARDIOGRAM COMPLETE  Result Date: 09/26/2021    ECHOCARDIOGRAM REPORT   Patient Name:   Stacey Reyes Date of Exam: 09/26/2021 Medical Rec #:  [id number]         Height:       66.0 in Accession #:    [id number]        Weight:       201.3  lb Date of Birth:  1946/02/27         BSA:          2.005 m Patient Age:    13 years          BP:           126/69 mmHg Patient Gender: F                 HR:           64 bpm. Exam Location:  Inpatient Procedure: 2D Echo, Cardiac Doppler and Color Doppler Indications:    TIA  History:        Patient has prior history of Echocardiogram examinations. Risk                 Factors:Hypertension.  Sonographer:    Jyl Heinz Referring Phys: [place]  1. Left ventricular ejection fraction, by estimation, is 60 to 65%. The left ventricle has normal function. The left ventricle has no regional wall motion abnormalities. There is severe concentric left ventricular hypertrophy. Left ventricular diastolic  parameters are consistent with Grade I diastolic dysfunction (impaired relaxation).  2. Right ventricular systolic function is normal. The right ventricular size is normal.  3. Left atrial size was severely dilated.  4. The mitral valve is normal in structure. Trivial mitral valve regurgitation. No evidence of mitral stenosis.  5. The aortic valve is normal in structure. Aortic valve regurgitation is not visualized. No aortic stenosis is present.  6. The inferior vena cava is normal in size with greater than 50% respiratory variability, suggesting right atrial pressure of 3 mmHg. FINDINGS  Left Ventricle: Left ventricular ejection fraction, by estimation, is 60 to 65%. The left ventricle has normal function. The left ventricle has no regional wall motion abnormalities. The left ventricular internal cavity size was normal in size. There is  severe concentric left ventricular hypertrophy. Left ventricular diastolic parameters are consistent with Grade I diastolic dysfunction (impaired relaxation). Right Ventricle: The right ventricular size is normal. No increase in right ventricular wall thickness. Right ventricular systolic function is normal. Left Atrium: Left atrial size was severely dilated. Right  Atrium: Right atrial size was normal in size. Pericardium: There is no evidence of pericardial effusion. Presence of epicardial fat layer. Mitral Valve: The mitral valve is normal in structure. Mild mitral annular calcification. Trivial mitral valve regurgitation. No evidence of mitral valve stenosis. Tricuspid Valve: The tricuspid valve is normal in structure. Tricuspid valve regurgitation is trivial. No evidence of tricuspid stenosis. Aortic Valve: The aortic valve is normal in structure. Aortic valve regurgitation is not visualized. No aortic stenosis is present. Aortic valve mean gradient measures 14.0 mmHg. Aortic valve  peak gradient measures 23.7 mmHg. Pulmonic Valve: The pulmonic valve was normal in structure. Pulmonic valve regurgitation is not visualized. No evidence of pulmonic stenosis. Aorta: The aortic root is normal in size and structure. Venous: The inferior vena cava is normal in size with greater than 50% respiratory variability, suggesting right atrial pressure of 3 mmHg. IAS/Shunts: No atrial level shunt detected by color flow Doppler.  LEFT VENTRICLE PLAX 2D LVIDd:         3.90 cm      Diastology LVIDs:         2.20 cm      LV e' medial:    5.45 cm/s LV PW:         1.70 cm      LV E/e' medial:  16.8 LV IVS:        1.60 cm      LV e' lateral:   3.94 cm/s LVOT diam:     2.00 cm      LV E/e' lateral: 23.3 LVOT Area:     3.14 cm  LV Volumes (MOD) LV vol d, MOD A2C: 135.0 ml LV vol d, MOD A4C: 135.0 ml LV vol s, MOD A2C: 46.5 ml LV vol s, MOD A4C: 49.6 ml LV SV MOD A2C:     88.5 ml LV SV MOD A4C:     135.0 ml LV SV MOD BP:      86.2 ml RIGHT VENTRICLE             IVC RV Basal diam:  3.40 cm     IVC diam: 1.90 cm RV Mid diam:    3.00 cm RV S prime:     17.30 cm/s TAPSE (M-mode): 2.8 cm LEFT ATRIUM             Index        RIGHT ATRIUM           Index LA diam:        3.70 cm 1.85 cm/m   RA Area:     17.60 cm LA Vol (A2C):   94.1 ml 46.93 ml/m  RA Volume:   42.70 ml  21.29 ml/m LA Vol (A4C):   98.9  ml 49.32 ml/m LA Biplane Vol: 96.7 ml 48.22 ml/m  AORTIC VALVE AV Vmax:      243.33 cm/s AV Vmean:     183.667 cm/s AV VTI:       0.579 m AV Peak Grad: 23.7 mmHg AV Mean Grad: 14.0 mmHg  AORTA Ao Root diam: 3.40 cm Ao Asc diam:  3.20 cm MITRAL VALVE                TRICUSPID VALVE MV Area (PHT): 3.70 cm     TR Peak grad:   16.6 mmHg MV Decel Time: 205 msec     TR Vmax:        204.00 cm/s MV E velocity: 91.80 cm/s MV A velocity: 121.00 cm/s  SHUNTS MV E/A ratio:  0.76         Systemic Diam: 2.00 cm Kardie Tobb DO Electronically signed by Berniece Salines DO Signature Date/Time: 09/26/2021/2:51:11 PM    Final    MR BRAIN WO CONTRAST  Result Date: 09/25/2021 CLINICAL DATA:  TIA EXAM: MRI HEAD WITHOUT CONTRAST TECHNIQUE: Multiplanar, multiecho pulse sequences of the brain and surrounding structures were obtained without intravenous contrast. COMPARISON:  Same-day CTA head/neck FINDINGS: Brain: There is no acute intracranial hemorrhage, extra-axial fluid collection, or acute infarct. Parenchymal  volume is normal for age. The ventricles are normal in size. Gray-white differentiation is preserved. There are a few tiny foci of FLAIR signal abnormality in the subcortical and periventricular white matter likely reflecting minimal chronic white matter microangiopathy. There is no suspicious parenchymal signal abnormality. There is no mass lesion. There is no mass effect or midline shift. Vascular: Normal flow voids. Skull and upper cervical spine: Normal marrow signal. Sinuses/Orbits: The paranasal sinuses are clear. Bilateral lens implants are in place. The globes and orbits are otherwise unremarkable. Other: None. IMPRESSION: Normal for age brain MRI with no acute intracranial pathology. Electronically Signed   By: Valetta Mole M.D.   On: 09/25/2021 19:46   CT ANGIO HEAD NECK W WO CM  Result Date: 09/25/2021 CLINICAL DATA:  Acute neurologic deficit EXAM: CT ANGIOGRAPHY HEAD AND NECK TECHNIQUE: Multidetector CT imaging of  the head and neck was performed using the standard protocol during bolus administration of intravenous contrast. Multiplanar CT image reconstructions and MIPs were obtained to evaluate the vascular anatomy. Carotid stenosis measurements (when applicable) are obtained utilizing NASCET criteria, using the distal internal carotid diameter as the denominator. RADIATION DOSE REDUCTION: This exam was performed according to the departmental dose-optimization program which includes automated exposure control, adjustment of the mA and/or kV according to patient size and/or use of iterative reconstruction technique. CONTRAST:  44m OMNIPAQUE IOHEXOL 350 MG/ML SOLN COMPARISON:  None Available. FINDINGS: CT HEAD FINDINGS Brain: There is no mass, hemorrhage or extra-axial collection. The size and configuration of the ventricles and extra-axial CSF spaces are normal. There is no acute or chronic infarction. The brain parenchyma is normal. Skull: The visualized skull base, calvarium and extracranial soft tissues are normal. Sinuses/Orbits: No fluid levels or advanced mucosal thickening of the visualized paranasal sinuses. No mastoid or middle ear effusion. The orbits are normal. CTA NECK FINDINGS SKELETON: There is no bony spinal canal stenosis. No lytic or blastic lesion. OTHER NECK: Normal pharynx, larynx and major salivary glands. No cervical lymphadenopathy. Unremarkable thyroid gland. UPPER CHEST: No pneumothorax or pleural effusion. No nodules or masses. AORTIC ARCH: There is calcific atherosclerosis of the aortic arch. There is no aneurysm, dissection or hemodynamically significant stenosis of the visualized portion of the aorta. Conventional 3 vessel aortic branching pattern. The visualized proximal subclavian arteries are widely patent. RIGHT CAROTID SYSTEM: Normal without aneurysm, dissection or stenosis. LEFT CAROTID SYSTEM: Normal without aneurysm, dissection or stenosis. VERTEBRAL ARTERIES: Left dominant  configuration. Both origins are clearly patent. There is no dissection, occlusion or flow-limiting stenosis to the skull base (V1-V3 segments). CTA HEAD FINDINGS POSTERIOR CIRCULATION: --Vertebral arteries: Normal V4 segments. --Inferior cerebellar arteries: Normal. --Basilar artery: Normal. --Superior cerebellar arteries: Normal. --Posterior cerebral arteries (PCA): Normal. ANTERIOR CIRCULATION: --Intracranial internal carotid arteries: Normal. --Anterior cerebral arteries (ACA): Normal. Both A1 segments are present. Patent anterior communicating artery (a-comm). --Middle cerebral arteries (MCA): Normal. VENOUS SINUSES: As permitted by contrast timing, patent. ANATOMIC VARIANTS: None Review of the MIP images confirms the above findings. IMPRESSION: 1. No emergent large vessel occlusion or high-grade stenosis of the intracranial or cervical arteries. 2. Aortic Atherosclerosis (ICD10-I70.0). Electronically Signed   By: KUlyses JarredM.D.   On: 09/25/2021 19:25   DG Chest Port 1 View  Result Date: 09/16/2021 CLINICAL DATA:  Shortness of breath for 2 days EXAM: PORTABLE CHEST 1 VIEW COMPARISON:  12/11/2026 FINDINGS: Cardiac shadow is within normal limits. Aortic calcifications are seen. Lungs are clear bilaterally. Mild chronic interstitial changes are noted. No acute bony abnormality  is seen. IMPRESSION: No active disease. Electronically Signed   By: Inez Catalina M.D.   On: 09/16/2021 19:05    Microbiology: Results for orders placed or performed during the hospital encounter of 12/04/20  SARS CORONAVIRUS 2 (Chelesa Weingartner 6-24 HRS) Nasopharyngeal Nasopharyngeal Swab     Status: None   Collection Time: 12/04/20  2:30 PM   Specimen: Nasopharyngeal Swab  Result Value Ref Range Status   SARS Coronavirus 2 NEGATIVE NEGATIVE Final    Comment: (NOTE) SARS-CoV-2 target nucleic acids are NOT DETECTED.  The SARS-CoV-2 RNA is generally detectable in upper and lower respiratory specimens during the acute phase of infection.  Negative results do not preclude SARS-CoV-2 infection, do not rule out co-infections with other pathogens, and should not be used as the sole basis for treatment or other patient management decisions. Negative results must be combined with clinical observations, patient history, and epidemiological information. The expected result is Negative.  Fact Sheet for Patients: SugarRoll.be  Fact Sheet for Healthcare Providers: https://www.woods-mathews.com/  This test is not yet approved or cleared by the [place] FDA and  has been authorized for detection and/or diagnosis of SARS-CoV-2 by FDA under an Emergency Use Authorization (EUA). This EUA will remain  in effect (meaning this test can be used) for the duration of the COVID-19 declaration under Se ction 564(b)(1) of the Act, 21 U.S.C. section 360bbb-3(b)(1), unless the authorization is terminated or revoked sooner.  Performed at [facility], [place] [address] [place], [place] [address]   MRSA Next Gen by PCR, Nasal     Status: None   Collection Time: 12/05/20  8:37 PM   Specimen: Nasal Mucosa; Nasal Swab  Result Value Ref Range Status   MRSA by PCR Next Gen NOT DETECTED NOT DETECTED Final    Comment: (NOTE) The GeneXpert MRSA Assay (FDA approved for NASAL specimens only), is one component of a comprehensive MRSA colonization surveillance program. It is not intended to diagnose MRSA infection nor to guide or monitor treatment for MRSA infections. Test performance is not FDA approved in patients less than 33 years old. Performed at [facility], [address] [place], [place] [address]     Labs: CBC: Recent Labs  Lab 10/05/21 0843 10/05/21 1205 10/05/21 1205 10/06/21 1514 10/06/21 1904 10/07/21 0711 10/08/21 0514 10/08/21 0652 10/09/21 0543  WBC 5.5 6.1  --  7.1  --  5.8 4.5  --  4.6  NEUTROABS 3.3 3.8  --   --   --   --   --   --   --   HGB 7.1* 7.1*   < > 7.7* 7.3*  8.1* 6.9* 6.9* 7.9*  HCT 22.6* 22.6*   < > 24.8* 23.1* 25.9* 21.9* 21.8* 25.0*  MCV 87 90.4  --  89.9  --  91.5 91.3  --  87.7  PLT 259 236  --  241  --  201 191  --  184   < > = values in this interval not displayed.   Basic Metabolic Panel: Recent Labs  Lab 10/05/21 1205 10/06/21 1514  NA 138 137  K 4.0 3.7  CL 105 108  CO2 24 25  GLUCOSE 109* 150*  BUN 9 17  CREATININE 0.73 0.76  CALCIUM 9.0 8.6*  MG 2.0  --    Liver Function Tests: Recent Labs  Lab 10/06/21 1514  AST 21  ALT 14  ALKPHOS 24*  BILITOT 0.9  PROT 6.2*  ALBUMIN 3.9   CBG: No  results for input(s): "GLUCAP" in the last 168 hours.  Discharge time spent: greater than 30 minutes.  Signed: Orson Eva, MD [facility] 10/09/2021

## 2021-10-09 NOTE — Progress Notes (Signed)
Telemetry Called Patient had 8 beat run of non-sustained v-tach. Patient is sleeping. Dr. Josephine Cables notified. No new orders at this time will continue to monitor patient closely.

## 2021-10-10 ENCOUNTER — Other Ambulatory Visit (HOSPITAL_COMMUNITY): Payer: Self-pay | Admitting: *Deleted

## 2021-10-10 ENCOUNTER — Telehealth: Payer: Self-pay

## 2021-10-10 DIAGNOSIS — D500 Iron deficiency anemia secondary to blood loss (chronic): Secondary | ICD-10-CM

## 2021-10-10 NOTE — Telephone Encounter (Signed)
Transition Care Management Follow-up Telephone Call Date of discharge and from where: 10/09/21 - [facility] - anemia How have you been since you were released from the hospital? Doing well, good nights rest, more energy today Any questions or concerns? No  Items Reviewed: Did the pt receive and understand the discharge instructions provided? Yes  Medications obtained and verified? Yes  Other? No  Any new allergies since your discharge? No  Dietary orders reviewed? Yes Do you have support at home? Yes   Home Care and Equipment/Supplies: Were home health services ordered? No  Were any new equipment or medical supplies ordered?  No  Functional Questionnaire: (I = Independent and D = Dependent) ADLs: I  Bathing/Dressing- I  Meal Prep- I  Eating- I  Maintaining continence- I  Transferring/Ambulation- I  Managing Meds- I  Follow up appointments reviewed:  PCP Hospital f/u appt confirmed? Yes  Scheduled to see [facility] on 10/17/21 @ 10:35. [facility] f/u appt confirmed? Yes  Scheduled to see Aliene Altes, GI on 12/05/21 @ 3. Are transportation arrangements needed? No  If their condition worsens, is the pt aware to call PCP or go to the Emergency Dept.? Yes Was the patient provided with contact information for the PCP's office or ED? Yes Was to pt encouraged to call back with questions or concerns? Yes

## 2021-10-15 ENCOUNTER — Inpatient Hospital Stay (HOSPITAL_COMMUNITY): Payer: Medicare Other | Attending: Hematology

## 2021-10-15 DIAGNOSIS — K921 Melena: Secondary | ICD-10-CM | POA: Diagnosis not present

## 2021-10-15 DIAGNOSIS — K922 Gastrointestinal hemorrhage, unspecified: Secondary | ICD-10-CM | POA: Insufficient documentation

## 2021-10-15 DIAGNOSIS — D500 Iron deficiency anemia secondary to blood loss (chronic): Secondary | ICD-10-CM | POA: Diagnosis not present

## 2021-10-15 LAB — FERRITIN: Ferritin: 64 ng/mL (ref 11–307)

## 2021-10-15 LAB — CBC WITH DIFFERENTIAL/PLATELET
Abs Immature Granulocytes: 0.01 10*3/uL (ref 0.00–0.07)
Basophils Absolute: 0.1 10*3/uL (ref 0.0–0.1)
Basophils Relative: 1 %
Eosinophils Absolute: 0.2 10*3/uL (ref 0.0–0.5)
Eosinophils Relative: 4 %
HCT: 30.3 % — ABNORMAL LOW (ref 36.0–46.0)
Hemoglobin: 9.5 g/dL — ABNORMAL LOW (ref 12.0–15.0)
Immature Granulocytes: 0 %
Lymphocytes Relative: 29 %
Lymphs Abs: 1.7 10*3/uL (ref 0.7–4.0)
MCH: 28.3 pg (ref 26.0–34.0)
MCHC: 31.4 g/dL (ref 30.0–36.0)
MCV: 90.2 fL (ref 80.0–100.0)
Monocytes Absolute: 0.6 10*3/uL (ref 0.1–1.0)
Monocytes Relative: 11 %
Neutro Abs: 3.2 10*3/uL (ref 1.7–7.7)
Neutrophils Relative %: 55 %
Platelets: 274 10*3/uL (ref 150–400)
RBC: 3.36 MIL/uL — ABNORMAL LOW (ref 3.87–5.11)
RDW: 16.5 % — ABNORMAL HIGH (ref 11.5–15.5)
WBC: 5.8 10*3/uL (ref 4.0–10.5)
nRBC: 0 % (ref 0.0–0.2)

## 2021-10-15 LAB — IRON AND TIBC
Iron: 69 ug/dL (ref 28–170)
Saturation Ratios: 16 % (ref 10.4–31.8)
TIBC: 426 ug/dL (ref 250–450)
UIBC: 357 ug/dL

## 2021-10-16 ENCOUNTER — Inpatient Hospital Stay (HOSPITAL_BASED_OUTPATIENT_CLINIC_OR_DEPARTMENT_OTHER): Payer: Medicare Other | Admitting: Physician Assistant

## 2021-10-16 ENCOUNTER — Telehealth (HOSPITAL_COMMUNITY): Payer: Medicare Other | Admitting: Physician Assistant

## 2021-10-16 ENCOUNTER — Ambulatory Visit (HOSPITAL_COMMUNITY): Payer: Medicare Other | Admitting: Physician Assistant

## 2021-10-16 DIAGNOSIS — D500 Iron deficiency anemia secondary to blood loss (chronic): Secondary | ICD-10-CM | POA: Diagnosis not present

## 2021-10-16 DIAGNOSIS — E538 Deficiency of other specified B group vitamins: Secondary | ICD-10-CM | POA: Diagnosis not present

## 2021-10-16 NOTE — Progress Notes (Signed)
Virtual Visit via Telephone Note [facility]  I connected with Stacey Reyes  on 10/16/21 at  11:17 AM by telephone and verified that I am speaking with the correct person using two identifiers.  Location: Patient: Home Provider: [facility]   I discussed the limitations, risks, security and privacy concerns of performing an evaluation and management service by telephone and the availability of in person appointments. I also discussed with the patient that there may be a patient responsible charge related to this service. The patient expressed understanding and agreed to proceed.  REASON FOR VISIT:  Follow-up for iron deficiency   CURRENT THERAPY: Intermittent IV iron infusions (Last Feraheme on 03/15/2021 and 03/22/2021, received inpatient Ferrlecit on 10/09/2021)  INTERVAL HISTORY: Stacey Reyes is contacted today for follow-up of her iron deficiency anemia related to chronic blood loss.  She was last seen by Tarri Abernethy PA-C on 06/12/2021.  Since her last visit, patient has had 3 hospitalizations related to acute blood loss anemia. Hospitalization 09/16/2021 to 09/19/2021: Acute blood loss anemia.  EGD (09/17/2021) showed blood in the duodenal bulb and second and third portion of the duodenum, but no presence of actively bleeding source.   Hospitalization 09/25/2021 to 09/27/2021: Presented with melena.  Hgb was 6.2.  Transfused 2 units PRBC.  EGD (09/26/2021) demonstrated multiple gastric polyps, normal small intestine to the jejunum.  Video capsule endoscopy placed at the time, reportedly negative.   ED visit 10/05/2021: Hgb 7.1, transfused 1 unit PRBC. Hospitalization 10/06/2021 through 10/09/2021: Presented with melena, Hgb down to 6.9.  EGD (10/07/2021) with active bleeding distal duodenal mucosa consistent with Dieulafoy lesion, clips placed x4.  Transfused 2 units PRBC during hospital stay.  Received Ferrlecit 250 mg on 10/09/2021 during  hospitalization.  She continues to have some fatigue following her hospitalizations and GI bleeding, but reports that she is improving.  She has some mild dyspnea on exertion and feeling of racing heartbeat.  She continued to have some residual melena for several days after her hospital discharge, reports for the last 2 days she has had normal brown" bowel movements for the past 2 days.  She denies any pica, restless legs, headaches lightheadedness, syncope, chest pain. She has 75% energy and 100% appetite. She endorses that she is maintaining a stable weight.    OBSERVATIONS/OBJECTIVE: Review of Systems  Constitutional:  Positive for malaise/fatigue. Negative for chills, diaphoresis, fever and weight loss.  Respiratory:  Positive for shortness of breath (with exertion). Negative for cough.   Cardiovascular:  Positive for palpitations. Negative for chest pain.  Gastrointestinal:  Negative for abdominal pain, blood in stool, melena, nausea and vomiting.  Neurological:  Negative for dizziness and headaches.  Psychiatric/Behavioral:  The patient has insomnia.      PHYSICAL EXAM (per limitations of virtual telephone visit): The patient is alert and oriented x 3, exhibiting adequate mentation, good mood, and ability to speak in full sentences and execute sound judgement.   ASSESSMENT & PLAN: 1.  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  No improvement despite taking iron tablet twice daily - Patient has had multiple hospitalizations within the past year for acute blood loss anemia, is followed closely by gastroenterology - She had two hospitalization in September 2022, and received a total of 8 units PRBC during these hospital stays.  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 - 3 hospitalizations in June/July 2023,  total of 4 units PRBC during his hospital stays.   - [facility] (10/06/2021 - 10/09/2021): Presented with melena, Hgb down to 6.9.  EGD (10/07/2021) with active bleeding distal duodenal mucosa consistent with Dieulafoy lesion, clips placed x4.  Transfused 2 units PRBC during hospital stay.  Received Ferrlecit 250 mg on 10/09/2021 during hospitalization. - Most recent IV Feraheme on 03/15/2021 and 03/22/2021 - She continued to have some residual melena for several days after her most recent hospital discharge (10/09/2021), but has had normal bowel movements for the past 2 days -She is symptomatic with residual fatigue, dyspnea on exertion, and racing heart - Most recent labs (10/15/2021): Hgb 9.5/MCV 90.2, ferritin 64, iron saturation 16%. - PLAN: IV Feraheme x2 - Repeat labs and RTC in 2 months with phone visit  - Patient is aware of alarm symptoms that would prompt immediate medical attention.   2.  B12 deficiency - She is taking B12 tablet 1000 mcg daily - Most recent labs (10/30/2020): B12 874, methylmalonic acid 138 - PLAN: Continue B12 supplement.  We will recheck levels every 6 to 12 months - recheck at next visit.     FOLLOW UP INSTRUCTIONS: Feraheme x2 Labs in 9 weeks (CBC, iron panel, B12 panel) Phone visit after labs    I discussed the assessment and treatment plan with the patient. The patient was provided an opportunity to ask questions and all were answered. The patient agreed with the plan and demonstrated an understanding of the instructions.   The patient was advised to call back or seek an in-person evaluation if the symptoms worsen or if the condition fails to improve as anticipated.  I provided 15 minutes of non-face-to-face time during this encounter.   Harriett Rush, PA-C 10/16/21 11:38 AM

## 2021-10-17 ENCOUNTER — Encounter: Payer: Self-pay | Admitting: Family Medicine

## 2021-10-17 ENCOUNTER — Ambulatory Visit (INDEPENDENT_AMBULATORY_CARE_PROVIDER_SITE_OTHER): Payer: Medicare Other | Admitting: Family Medicine

## 2021-10-17 ENCOUNTER — Telehealth: Payer: Self-pay | Admitting: Emergency Medicine

## 2021-10-17 VITALS — BP 112/71 | HR 84 | Temp 97.7°F | Ht 66.0 in | Wt 196.2 lb

## 2021-10-17 DIAGNOSIS — Z09 Encounter for follow-up examination after completed treatment for conditions other than malignant neoplasm: Secondary | ICD-10-CM

## 2021-10-17 DIAGNOSIS — D62 Acute posthemorrhagic anemia: Secondary | ICD-10-CM | POA: Diagnosis not present

## 2021-10-17 DIAGNOSIS — K922 Gastrointestinal hemorrhage, unspecified: Secondary | ICD-10-CM | POA: Diagnosis not present

## 2021-10-17 LAB — CBC
Hematocrit: 31.9 % — ABNORMAL LOW (ref 34.0–46.6)
Hemoglobin: 9.9 g/dL — ABNORMAL LOW (ref 11.1–15.9)
MCH: 27.4 pg (ref 26.6–33.0)
MCHC: 31 g/dL — ABNORMAL LOW (ref 31.5–35.7)
MCV: 88 fL (ref 79–97)
Platelets: 275 10*3/uL (ref 150–450)
RBC: 3.61 x10E6/uL — ABNORMAL LOW (ref 3.77–5.28)
RDW: 15.3 % (ref 11.7–15.4)
WBC: 5.2 10*3/uL (ref 3.4–10.8)

## 2021-10-17 NOTE — Telephone Encounter (Signed)
TC from [facility]  Hgb- 9.9 RBC- 3.61 Hematocrit- 31.9

## 2021-10-17 NOTE — Progress Notes (Signed)
Subjective: CC: Hospital follow-up for symptomatic anemia PCP: Stacey Norlander, DO JJH:ERDEYCXK H Gangemi is a 76 y.o. female presenting to clinic today for:  1.  Anemia Patient admitted earlier in July for symptomatic anemia with hemoglobin of 6.2 at the time.  She was given 2 units of packed red blood cells.  She subsequently developed some expressive aphasia and was transferred to [facility] for neurologic evaluation.  At that time it was unclear as to whether or not this was related to a TIA versus MCI.  Because of the suspected GI bleeding, they advised against antiplatelets.  At time of discharge her hemoglobin was 7.9.  She had repeat EGD and clips were placed as they were able to identify a bleed.  She was advised to continue oral iron and PPI.  Patient was seen yesterday by her outpatient hematologist.  IV Feraheme as planned.  Future orders for labs were placed during her telephone visit yesterday.  She notes that she had labs done on Monday at her CBC showed hemoglobin of over 9.  She is feeling better.  She still has some dyspnea with a lot of exertion and some tachycardia with moderate exertion but overall she is tolerating activity without difficulty.  Still little fatigued.  Not having any dizziness, feeling faint.  She had some mild diarrhea today with some darkness to her stool.  Not sure if this was related to what she ate but she wanted to get this checked out today.  She denies any abdominal pain, nausea, vomiting.  Follow-up with gastroenterology will be in about a month.  ROS: Per HPI  Allergies  Allergen Reactions   Sulfa Antibiotics Rash   Past Medical History:  Diagnosis Date   Anemia    Blood transfusion without reported diagnosis    Cataract    removed bilateral   Chronic cystitis    Diverticulitis    Heart murmur    Hyperlipidemia    Hypertension    Personal history of colonic polyps-adenomas 01/07/2012   2009 - 2 diminutive adenomas (prior polyps  also) 01/07/2012 - 2 diminutive adenomas      Current Outpatient Medications:    Ascorbic Acid (VITAMIN C PO), Take 1 tablet by mouth daily., Disp: , Rfl:    CALCIUM PO, Take 1 tablet by mouth daily., Disp: , Rfl:    Cholecalciferol (VITAMIN D-3 PO), Take 1 capsule by mouth daily., Disp: , Rfl:    COPPER PO, Take by mouth., Disp: , Rfl:    ferrous sulfate 325 (65 FE) MG tablet, Take 1 tablet (325 mg total) by mouth daily., Disp: 30 tablet, Rfl: 3   levocetirizine (XYZAL) 5 MG tablet, TAKE 1 TABLET (5 MG TOTAL) BY MOUTH AT BEDTIME AS NEEDED FOR ALLERGIES (DRAINAGE)., Disp: 90 tablet, Rfl: 1   MAGNESIUM PO, Take 1 tablet by mouth daily., Disp: , Rfl:    pantoprazole (PROTONIX) 40 MG tablet, Take 1 tablet (40 mg total) by mouth 2 (two) times daily., Disp: 60 tablet, Rfl: 1   rosuvastatin (CRESTOR) 10 MG tablet, Take 1 tablet (10 mg total) by mouth at bedtime., Disp: 90 tablet, Rfl: 3   traZODone (DESYREL) 50 MG tablet, Take 1 tablet (50 mg total) by mouth at bedtime as needed for sleep., Disp: 90 tablet, Rfl: 3   VITAMIN E PO, Take 1 tablet by mouth daily., Disp: , Rfl:  Social History   Socioeconomic History   Marital status: Married    Spouse name: Not on  file   Number of children: 1   Years of education: Not on file   Highest education level: Not on file  Occupational History   Occupation: retired  Tobacco Use   Smoking status: Former    Types: Cigarettes    Quit date: 12/23/1984    Years since quitting: 36.8   Smokeless tobacco: Never  Vaping Use   Vaping Use: Never used  Substance and Sexual Activity   Alcohol use: Yes    Comment: wine occasionally   Drug use: No   Sexual activity: Not on file  Other Topics Concern   Not on file  Social History Narrative   Patient is married and retired and has 1 grown child   Social Determinants of Radio broadcast assistant Strain: [place]  (03/21/2021)   Overall Financial Resource Strain (CARDIA)    Difficulty of Paying Living  Expenses: Not hard at all  Food Insecurity: No Food Insecurity (03/21/2021)   Hunger Vital Sign    Worried About Running Out of Food in the Last Year: Never true    [place] in the Last Year: Never true  Transportation Needs: No Transportation Needs (03/21/2021)   PRAPARE - Hydrologist (Medical): No    Lack of Transportation (Non-Medical): No  Physical Activity: Sufficiently Active (03/21/2021)   Exercise Vital Sign    Days of Exercise per Week: 1 day    Minutes of Exercise per Session: 150+ min  Stress: No Stress Concern Present (03/21/2021)   [place]    Feeling of Stress : Only a little  Social Connections: Moderately Integrated (03/21/2021)   Social Connection and Isolation Panel [NHANES]    Frequency of Communication with Friends and Family: More than three times a week    Frequency of Social Gatherings with Friends and Family: More than three times a week    Attends Religious Services: More than 4 times per year    Active Member of [employer] or Organizations: No    Attends Archivist Meetings: Never    Marital Status: Married  Human resources officer Violence: Not At Risk (03/21/2021)   Humiliation, Afraid, Rape, and Kick questionnaire    Fear of Current or Ex-Partner: No    Emotionally Abused: No    Physically Abused: No    Sexually Abused: No   Family History  Problem Relation Age of Onset   Colon cancer Mother 44       80's   Breast cancer Sister    Asthma Brother    Colon polyps Neg Hx    Esophageal cancer Neg Hx    Rectal cancer Neg Hx    Stomach cancer Neg Hx     Objective: Office vital signs reviewed. BP 112/71   Pulse 84   Temp 97.7 F (36.5 C)   Ht '5\' 6"'$  (1.676 m)   Wt 196 lb 3.2 oz (89 kg)   SpO2 97%   BMI 31.67 kg/m   Physical Examination:  General: Awake, alert, well nourished, No acute distress HEENT: Somewhat pale appearing Cardio:  regular rate and rhythm, S1S2 heard, 3/6 SEM appreciated that does not radiate to the carotids Pulm: clear to auscultation bilaterally, no wheezes, rhonchi or rales; normal work of breathing on room air GI: soft, non-tender, non-distended  Extremities: warm, well perfused, trace ankle edema in the left lower extremity, No cyanosis or clubbing; +2 pulses bilaterally  Assessment/ Plan: 75  y.o. female   Gastrointestinal hemorrhage, unspecified gastrointestinal hemorrhage type - Plan: CBC, [facility] discharge follow-up  Acute blood loss anemia - Plan: CBC, CBC  Seems to be improving after treatment in the hospital for GI hemorrhage.  They have likely identified and clipped these lesions.  Hopefully this will result in her avoiding any future hospitalizations for anemia.  We will obtain a stat CBC today and I have placed a standing stat CBC going forward so that she may come in at her convenience if she is having any concerning symptoms or signs.  Keep follow-up with gastroenterology and hematology as directed.  No orders of the defined types were placed in this encounter.  No orders of the defined types were placed in this encounter.   Today's visit is for Transitional Care Management.  The patient was discharged from [facility] on 10/09/2021 with a primary diagnosis of Anemia.   Contact with the patient and/or caregiver, by a clinical staff member, was made on 10/10/2021 and was documented as a telephone encounter within the EMR.  Through chart review and discussion with the patient I have determined that management of their condition is of moderate complexity.    Stacey Norlander, DO [place] [phone]

## 2021-10-17 NOTE — Telephone Encounter (Signed)
Pt is aware.  

## 2021-10-17 NOTE — Telephone Encounter (Signed)
Please inform patient hemoglobin is stable.

## 2021-10-22 ENCOUNTER — Telehealth: Payer: Self-pay | Admitting: *Deleted

## 2021-10-22 ENCOUNTER — Other Ambulatory Visit: Payer: Medicare Other

## 2021-10-22 ENCOUNTER — Inpatient Hospital Stay (HOSPITAL_COMMUNITY): Payer: Medicare Other

## 2021-10-22 VITALS — BP 148/69 | HR 71 | Temp 98.9°F | Resp 18

## 2021-10-22 DIAGNOSIS — K922 Gastrointestinal hemorrhage, unspecified: Secondary | ICD-10-CM

## 2021-10-22 DIAGNOSIS — D500 Iron deficiency anemia secondary to blood loss (chronic): Secondary | ICD-10-CM

## 2021-10-22 DIAGNOSIS — D62 Acute posthemorrhagic anemia: Secondary | ICD-10-CM | POA: Diagnosis not present

## 2021-10-22 DIAGNOSIS — K921 Melena: Secondary | ICD-10-CM | POA: Diagnosis not present

## 2021-10-22 DIAGNOSIS — D509 Iron deficiency anemia, unspecified: Secondary | ICD-10-CM

## 2021-10-22 LAB — CBC
Hematocrit: 33.8 % — ABNORMAL LOW (ref 34.0–46.6)
Hemoglobin: 10.5 g/dL — ABNORMAL LOW (ref 11.1–15.9)
MCH: 27.5 pg (ref 26.6–33.0)
MCHC: 31.1 g/dL — ABNORMAL LOW (ref 31.5–35.7)
MCV: 89 fL (ref 79–97)
Platelets: 235 10*3/uL (ref 150–450)
RBC: 3.82 x10E6/uL (ref 3.77–5.28)
RDW: 15.1 % (ref 11.7–15.4)
WBC: 6.1 10*3/uL (ref 3.4–10.8)

## 2021-10-22 MED ORDER — SODIUM CHLORIDE 0.9 % IV SOLN
510.0000 mg | Freq: Once | INTRAVENOUS | Status: AC
  Administered 2021-10-22: 510 mg via INTRAVENOUS
  Filled 2021-10-22: qty 17

## 2021-10-22 MED ORDER — SODIUM CHLORIDE 0.9 % IV SOLN: Freq: Once | INTRAVENOUS | Status: AC

## 2021-10-22 MED ORDER — LORATADINE 10 MG PO TABS
10.0000 mg | ORAL_TABLET | Freq: Once | ORAL | Status: AC
  Administered 2021-10-22: 10 mg via ORAL
  Filled 2021-10-22: qty 1

## 2021-10-22 MED ORDER — ACETAMINOPHEN 325 MG PO TABS
650.0000 mg | ORAL_TABLET | Freq: Once | ORAL | Status: AC
  Administered 2021-10-22: 650 mg via ORAL
  Filled 2021-10-22: qty 2

## 2021-10-22 NOTE — Telephone Encounter (Signed)
Spoke to pt, she informed me that she has been having black watery stool for a couple days. She would like to know if she could have a EDG to find out where the bleeding is coming from.

## 2021-10-22 NOTE — Patient Instructions (Signed)
[facility]  Discharge Instructions: Thank you for choosing [facility] to provide your oncology and hematology care.  If you have a lab appointment with the [facility], please come in thru the Main Entrance and check in at the main information desk.  Wear comfortable clothing and clothing appropriate for easy access to any Portacath or PICC line.   We strive to give you quality time with your provider. You may need to reschedule your appointment if you arrive late (15 or more minutes).  Arriving late affects you and other patients whose appointments are after yours.  Also, if you miss three or more appointments without notifying the office, you may be dismissed from the clinic at the provider's discretion.      For prescription refill requests, have your pharmacy contact our office and allow 72 hours for refills to be completed.    Today you received the following chemotherapy and/or immunotherapy agents Feraheme IV iron infusion.       BELOW ARE SYMPTOMS THAT SHOULD BE REPORTED IMMEDIATELY: *FEVER GREATER THAN 100.4 F (38 C) OR HIGHER *CHILLS OR SWEATING *NAUSEA AND VOMITING THAT IS NOT CONTROLLED WITH YOUR NAUSEA MEDICATION *UNUSUAL SHORTNESS OF BREATH *UNUSUAL BRUISING OR BLEEDING *URINARY PROBLEMS (pain or burning when urinating, or frequent urination) *BOWEL PROBLEMS (unusual diarrhea, constipation, pain near the anus) TENDERNESS IN MOUTH AND THROAT WITH OR WITHOUT PRESENCE OF ULCERS (sore throat, sores in mouth, or a toothache) UNUSUAL RASH, SWELLING OR PAIN  UNUSUAL VAGINAL DISCHARGE OR ITCHING   Items with * indicate a potential emergency and should be followed up as soon as possible or go to the Emergency Department if any problems should occur.  Please show the CHEMOTHERAPY ALERT CARD or IMMUNOTHERAPY ALERT CARD at check-in to the Emergency Department and triage nurse.  Should you have questions after your visit or need to cancel or reschedule  your appointment, please contact [facility] [phone]  and follow the prompts.  Office hours are 8:00 a.m. to 4:30 p.m. Monday - Friday. Please note that voicemails left after 4:00 p.m. may not be returned until the following business day.  We are closed weekends and major holidays. You have access to a nurse at all times for urgent questions. Please call the main number to the clinic [phone] and follow the prompts.  For any non-urgent questions, you may also contact your provider using MyChart. We now offer e-Visits for anyone 71 and older to request care online for non-urgent symptoms. For details visit [email or web address].   Also download the MyChart app! Go to the app store, search "MyChart", open the app, select [facility], and log in with your MyChart username and password.  Masks are optional in the cancer centers. If you would like for your care team to wear a mask while they are taking care of you, please let them know. For doctor visits, patients may have with them one support person who is at least 77 years old. At this time, visitors are not allowed in the infusion area.

## 2021-10-22 NOTE — Progress Notes (Signed)
Pt presents today for Feraheme IV iron infusion per provider's order. Vital signs stable and pt voiced no new complaints at this time.  Peripheral IV started with good blood return pre and post infusion.  Feraheme given today per MD orders. Tolerated infusion without adverse affects. Vital signs stable. No complaints at this time. Discharged from clinic ambulatory in stable condition. Alert and oriented x 3. F/U with [facility] as scheduled.    

## 2021-10-22 NOTE — Telephone Encounter (Signed)
Left detailed message on voicemail for pt to go to the ED for evaluations. And to call the office with any concerns.

## 2021-10-22 NOTE — Telephone Encounter (Signed)
If she is having recurrent black stools that had previously resolved, recommend she return to the ED for evaluation as she has history of bleeding dieulafoy lesion. She may need an EGD vs intervention with radiology team.

## 2021-10-22 NOTE — Telephone Encounter (Signed)
Noted  

## 2021-10-25 ENCOUNTER — Encounter (HOSPITAL_COMMUNITY): Payer: Self-pay

## 2021-10-25 ENCOUNTER — Emergency Department (HOSPITAL_COMMUNITY)
Admission: EM | Admit: 2021-10-25 | Discharge: 2021-10-25 | Disposition: A | Discharge status: Home / Self Care | Payer: Medicare Other | Attending: Student | Admitting: Student

## 2021-10-25 DIAGNOSIS — D649 Anemia, unspecified: Secondary | ICD-10-CM | POA: Diagnosis not present

## 2021-10-25 DIAGNOSIS — I10 Essential (primary) hypertension: Secondary | ICD-10-CM | POA: Diagnosis not present

## 2021-10-25 DIAGNOSIS — Z87891 Personal history of nicotine dependence: Secondary | ICD-10-CM | POA: Insufficient documentation

## 2021-10-25 DIAGNOSIS — K921 Melena: Secondary | ICD-10-CM | POA: Insufficient documentation

## 2021-10-25 DIAGNOSIS — R195 Other fecal abnormalities: Secondary | ICD-10-CM

## 2021-10-25 LAB — CBC WITH DIFFERENTIAL/PLATELET
Abs Immature Granulocytes: 0.01 10*3/uL (ref 0.00–0.07)
Basophils Absolute: 0.1 10*3/uL (ref 0.0–0.1)
Basophils Relative: 1 %
Eosinophils Absolute: 0.2 10*3/uL (ref 0.0–0.5)
Eosinophils Relative: 3 %
HCT: 33.5 % — ABNORMAL LOW (ref 36.0–46.0)
Hemoglobin: 10.5 g/dL — ABNORMAL LOW (ref 12.0–15.0)
Immature Granulocytes: 0 %
Lymphocytes Relative: 21 %
Lymphs Abs: 1.3 10*3/uL (ref 0.7–4.0)
MCH: 27.9 pg (ref 26.0–34.0)
MCHC: 31.3 g/dL (ref 30.0–36.0)
MCV: 89.1 fL (ref 80.0–100.0)
Monocytes Absolute: 0.6 10*3/uL (ref 0.1–1.0)
Monocytes Relative: 9 %
Neutro Abs: 3.9 10*3/uL (ref 1.7–7.7)
Neutrophils Relative %: 66 %
Platelets: 240 10*3/uL (ref 150–400)
RBC: 3.76 MIL/uL — ABNORMAL LOW (ref 3.87–5.11)
RDW: 16.3 % — ABNORMAL HIGH (ref 11.5–15.5)
WBC: 5.9 10*3/uL (ref 4.0–10.5)
nRBC: 0 % (ref 0.0–0.2)

## 2021-10-25 LAB — COMPREHENSIVE METABOLIC PANEL
ALT: 18 U/L (ref 0–44)
AST: 34 U/L (ref 15–41)
Albumin: 4.1 g/dL (ref 3.5–5.0)
Alkaline Phosphatase: 26 U/L — ABNORMAL LOW (ref 38–126)
Anion gap: 6 (ref 5–15)
BUN: 13 mg/dL (ref 8–23)
CO2: 24 mmol/L (ref 22–32)
Calcium: 9.3 mg/dL (ref 8.9–10.3)
Chloride: 106 mmol/L (ref 98–111)
Creatinine, Ser: 0.65 mg/dL (ref 0.44–1.00)
GFR, Estimated: >60 mL/min (ref >60)
Glucose, Bld: 106 mg/dL — ABNORMAL HIGH (ref 70–99)
Potassium: 4.1 mmol/L (ref 3.5–5.1)
Sodium: 136 mmol/L (ref 135–145)
Total Bilirubin: 0.7 mg/dL (ref 0.3–1.2)
Total Protein: 6.5 g/dL (ref 6.5–8.1)

## 2021-10-25 LAB — TYPE AND SCREEN
ABO/RH(D): O POS
Antibody Screen: NEGATIVE

## 2021-10-25 MED ORDER — PANTOPRAZOLE SODIUM 40 MG IV SOLR
40.0000 mg | Freq: Once | INTRAVENOUS | Status: AC
  Administered 2021-10-25: 40 mg via INTRAVENOUS
  Filled 2021-10-25: qty 10

## 2021-10-25 NOTE — ED Triage Notes (Signed)
Pt states dark stool x 1 week. Pt states her hgb was 10.5 Monday and is concerned that her hgb is low now, or if it is the iron she has been taking.  Denies weakness, dizziness.

## 2021-10-25 NOTE — ED Provider Notes (Signed)
[facility] Provider Note  CSN: [id number] Arrival date & time: 10/25/21 2951  Chief Complaint(s) Melena  HPI Stacey Reyes is a 76 y.o. female with PMH iron deficiency anemia on iron pills, HTN, HLD, previous history of GI bleeds emergency department for evaluation of dark stools.  Patient states that she followed up outpatient and found her hemoglobin to be 10.5 and in the setting of noticing darker colored stools, she came to the emergency department to ensure that she is not having additional GI bleed.  She states that she is not having any symptoms of her previous GI bleeds including no diarrhea no nausea, vomiting, hematemesis or other systemic symptoms.  She started taking iron pills approximately 1 month ago and states that her stools have started to darken since then and is unsure if the iron pills may be causing her stool to darken.  She has not seen any bright red blood per rectum or melena.   Past Medical History Past Medical History:  Diagnosis Date   Anemia    Blood transfusion without reported diagnosis    Cataract    removed bilateral   Chronic cystitis    Diverticulitis    Heart murmur    Hyperlipidemia    Hypertension    Personal history of colonic polyps-adenomas 01/07/2012   2009 - 2 diminutive adenomas (prior polyps also) 01/07/2012 - 2 diminutive adenomas     Patient Active Problem List   Diagnosis Date Noted   Gastrointestinal hemorrhage    Hypocalcemia 10/06/2021   Obesity (BMI 30-39.9) 10/06/2021   History of GI bleed 09/25/2021   Hypokalemia 09/16/2021   Elevated troponin 10/20/2020   Symptomatic anemia 10/19/2020   Morning headache 03/08/2020   Hypertrophic cardiomyopathy ([place]) 01/17/2020   Iron deficiency anemia 12/21/2019   Need for immunization against influenza 12/14/2019   Acute blood loss anemia 11/22/2019   Educated about COVID-19 virus infection 07/05/2019   Dyspnea on exertion 07/05/2019   B12 deficiency  03/25/2019   Absolute anemia 03/24/2019   Osteopenia after menopause 04/22/2018   Cardiac murmur due to mitral valve disorder 03/03/2018   Heart murmur 03/03/2018   LVH (left ventricular hypertrophy) 11/20/2015   Arrhythmia 10/11/2015   Hypertension, essential 02/24/2015   Hyperlipidemia 02/24/2015   Personal history of colonic polyps-adenomas 01/07/2012   Home Medication(s) Prior to Admission medications   Medication Sig Start Date End Date Taking? Authorizing Provider  Ascorbic Acid (VITAMIN C PO) Take 1 tablet by mouth daily.    [provider]  CALCIUM PO Take 1 tablet by mouth daily.    [provider]  Cholecalciferol (VITAMIN D-3 PO) Take 1 capsule by mouth daily.    [provider]  COPPER PO Take by mouth.    [provider]  cyanocobalamin (CYANOCOBALAMIN) 500 MCG tablet Take 500 mcg by mouth daily.    [provider]  ferrous sulfate 325 (65 FE) MG tablet Take 1 tablet (325 mg total) by mouth daily. 09/27/21 09/27/22  Patrecia Pour, MD  levocetirizine (XYZAL) 5 MG tablet TAKE 1 TABLET (5 MG TOTAL) BY MOUTH AT BEDTIME AS NEEDED FOR ALLERGIES (DRAINAGE). 09/03/21   [name], DO  MAGNESIUM PO Take 1 tablet by mouth daily.    [provider]  pantoprazole (PROTONIX) 40 MG tablet Take 1 tablet (40 mg total) by mouth 2 (two) times daily. 09/19/21   Orson Eva, MD  rosuvastatin (CRESTOR) 10 MG tablet Take 1 tablet (10 mg total) by  mouth at bedtime. 06/11/21   [name], DO  traZODone (DESYREL) 50 MG tablet Take 1 tablet (50 mg total) by mouth at bedtime as needed for sleep. 04/02/21   [name], DO  VITAMIN E PO Take 1 tablet by mouth daily.    [provider]                                                                                                                                    Past Surgical History Past Surgical History:  Procedure Laterality Date   BREAST BIOPSY Right    No Scar seen     COLONOSCOPY  multiple   ENTEROSCOPY N/A 09/26/2021   Procedure: ENTEROSCOPY;  Surgeon: Gatha Mayer, MD;  Location: [place];  Service: Gastroenterology;  Laterality: N/A;   ESOPHAGOGASTRODUODENOSCOPY N/A 10/07/2021   Procedure: ESOPHAGOGASTRODUODENOSCOPY (EGD);  Surgeon: Daneil Dolin, MD;  [facility];  Service: Endoscopy;  Laterality: N/A;  EGD with enteroscopy  with pediatric colonoscope   ESOPHAGOGASTRODUODENOSCOPY (EGD) WITH PROPOFOL N/A 11/23/2019   Procedure: ESOPHAGOGASTRODUODENOSCOPY (EGD) WITH PROPOFOL;  Surgeon: Daneil Dolin, MD;  [facility];  Service: Endoscopy;  Laterality: N/A;   ESOPHAGOGASTRODUODENOSCOPY (EGD) WITH PROPOFOL N/A 10/20/2020   Procedure: ESOPHAGOGASTRODUODENOSCOPY (EGD) WITH PROPOFOL;  Surgeon: Eloise Harman, DO;  [facility];  Service: Endoscopy;  Laterality: N/A;   ESOPHAGOGASTRODUODENOSCOPY (EGD) WITH PROPOFOL N/A 12/05/2020   Procedure: ESOPHAGOGASTRODUODENOSCOPY (EGD) WITH PROPOFOL;  Surgeon: Rogene Houston, MD;  [facility];  Service: Endoscopy;  Laterality: N/A;  with enteroscopy   ESOPHAGOGASTRODUODENOSCOPY (EGD) WITH PROPOFOL N/A 12/11/2020   Procedure: ESOPHAGOGASTRODUODENOSCOPY (EGD) WITH PROPOFOL;  Surgeon: Eloise Harman, DO;  [facility];  Service: Endoscopy;  Laterality: N/A;   ESOPHAGOGASTRODUODENOSCOPY (EGD) WITH PROPOFOL N/A 09/17/2021   Procedure: ESOPHAGOGASTRODUODENOSCOPY (EGD) WITH PROPOFOL;  Surgeon: Eloise Harman, DO;  [facility];  Service: Endoscopy;  Laterality: N/A;   GIVENS CAPSULE STUDY N/A 11/23/2019   Procedure: GIVENS CAPSULE STUDY;  Surgeon: Daneil Dolin, MD;  [facility];  Service: Endoscopy;  Laterality: N/A;   GIVENS CAPSULE STUDY N/A 10/22/2020   Procedure: GIVENS CAPSULE STUDY;  Surgeon: Eloise Harman, DO;  [facility];  Service: Endoscopy;  Laterality: N/A;   GIVENS CAPSULE STUDY N/A 09/26/2021   Procedure:  GIVENS CAPSULE STUDY;  Surgeon: Gatha Mayer, MD;  Location: [place];  Service: Gastroenterology;  Laterality: N/A;   HEMOSTASIS CLIP PLACEMENT  12/11/2020   Procedure: HEMOSTASIS CLIP PLACEMENT;  Surgeon: Eloise Harman, DO;  [facility];  Service: Endoscopy;;   IR ANGIOGRAM SELECTIVE EACH ADDITIONAL VESSEL  12/05/2020   IR ANGIOGRAM SELECTIVE EACH ADDITIONAL VESSEL  12/05/2020   IR ANGIOGRAM SELECTIVE EACH ADDITIONAL VESSEL  12/05/2020   IR ANGIOGRAM SELECTIVE EACH ADDITIONAL VESSEL  12/13/2020   IR ANGIOGRAM SELECTIVE EACH ADDITIONAL VESSEL  12/13/2020   IR ANGIOGRAM VISCERAL SELECTIVE  12/05/2020   IR ANGIOGRAM VISCERAL SELECTIVE  12/13/2020   IR EMBO ART  VEN HEMORR LYMPH EXTRAV  INC GUIDE ROADMAPPING  12/05/2020   IR EMBO ART  VEN HEMORR LYMPH EXTRAV  INC GUIDE ROADMAPPING  12/13/2020   IR RADIOLOGIST EVAL & MGMT  01/11/2021   IR US GUIDE VASC ACCESS LEFT  12/13/2020   IR US GUIDE VASC ACCESS RIGHT  12/05/2020   SUBMUCOSAL TATTOO INJECTION  09/26/2021   Procedure: SUBMUCOSAL TATTOO INJECTION;  Surgeon: Gatha Mayer, MD;  Location: [facility];  Service: Gastroenterology;;   Family History Family History  Problem Relation Age of Onset   Colon cancer Mother 59       80's   Breast cancer Sister    Asthma Brother    Colon polyps Neg Hx    Esophageal cancer Neg Hx    Rectal cancer Neg Hx    Stomach cancer Neg Hx     Social History Social History   Tobacco Use   Smoking status: Former    Types: Cigarettes    Quit date: 12/23/1984    Years since quitting: 36.8   Smokeless tobacco: Never  Vaping Use   Vaping Use: Never used  Substance Use Topics   Alcohol use: Yes    Comment: wine occasionally   Drug use: No   Allergies Sulfa antibiotics  Review of Systems Review of Systems  Gastrointestinal:        Dark stools    Physical Exam Vital Signs  I have reviewed the triage vital signs BP 121/85   Pulse 73   Temp 98.3 F (36.8 C) (Oral)   Resp 16    Ht '5\' 6"'$  (1.676 m)   Wt 90.3 kg   SpO2 97%   BMI 32.12 kg/m   Physical Exam Vitals and nursing note reviewed.  Constitutional:      General: She is not in acute distress.    Appearance: She is well-developed.  HENT:     Head: Normocephalic and atraumatic.  Eyes:     Conjunctiva/sclera: Conjunctivae normal.  Cardiovascular:     Rate and Rhythm: Normal rate and regular rhythm.     Heart sounds: No murmur heard. Pulmonary:     Effort: Pulmonary effort is normal. No respiratory distress.     Breath sounds: Normal breath sounds.  Abdominal:     Palpations: Abdomen is soft.     Tenderness: There is no abdominal tenderness.  Musculoskeletal:        General: No swelling.     Cervical back: Neck supple.  Skin:    General: Skin is warm and dry.     Capillary Refill: Capillary refill takes less than 2 seconds.  Neurological:     Mental Status: She is alert.  Psychiatric:        Mood and Affect: Mood normal.     ED Results and Treatments Labs (all labs ordered are listed, but only abnormal results are displayed) Labs Reviewed  COMPREHENSIVE METABOLIC PANEL - Abnormal; Notable for the following components:      Result Value   Glucose, Bld 106 (*)    Alkaline Phosphatase 26 (*)    All other components within normal limits  CBC WITH DIFFERENTIAL/PLATELET - Abnormal; Notable for the following components:   RBC 3.76 (*)    Hemoglobin 10.5 (*)    HCT 33.5 (*)    RDW 16.3 (*)    All other components within  normal limits  TYPE AND SCREEN                                                                                                                          Radiology No results found.  Pertinent labs & imaging results that were available during my care of the patient were reviewed by me and considered in my medical decision making (see MDM for details).  Medications Ordered in ED Medications  pantoprazole (PROTONIX) injection 40 mg (40 mg Intravenous Given 10/25/21 0917)                                                                                                                                      Procedures Procedures  (including critical care time)  Medical Decision Making / ED Course   This patient presents to the ED for concern of dark stools, this involves an extensive number of treatment options, and is a complaint that carries with it a high risk of complications and morbidity.  The differential diagnosis includes pigmentation from iron supplementation, upper GI bleed, lower GI bleed  MDM: Patient seen in the emergency room for evaluation of dark stools.  Laboratory evaluation reveals a stable hemoglobin of 10.5 with an MCV of 89.1.  The patient is currently asymptomatic and her presentation is almost certainly from her iron supplementation at this time.  I have very low suspicion for active life-threatening GI bleed.  With stable hemoglobin she is safe for discharge with outpatient follow-up.  Patient then discharged.   Additional history obtained:  -External records from outside source obtained and reviewed including: Chart review including previous notes, labs, imaging, consultation notes   Lab Tests: -I ordered, reviewed, and interpreted labs.   The pertinent results include:   Labs Reviewed  COMPREHENSIVE METABOLIC PANEL - Abnormal; Notable for the following components:      Result Value   Glucose, Bld 106 (*)    Alkaline Phosphatase 26 (*)    All other components within normal limits  CBC WITH DIFFERENTIAL/PLATELET - Abnormal; Notable for the following components:   RBC 3.76 (*)    Hemoglobin 10.5 (*)    HCT 33.5 (*)    RDW 16.3 (*)    All other components within normal limits  TYPE AND SCREEN     Medicines ordered and prescription drug management: Meds ordered this encounter  Medications   pantoprazole (  PROTONIX) injection 40 mg    -I have reviewed the patients home medicines and have made adjustments as needed  Critical  interventions none   Cardiac Monitoring: The patient was maintained on a cardiac monitor.  I personally viewed and interpreted the cardiac monitored which showed an underlying rhythm of: NSR  Social Determinants of Health:  Factors impacting patients care include: none   Reevaluation: After the interventions noted above, I reevaluated the patient and found that they have :improved  Co morbidities that complicate the patient evaluation  Past Medical History:  Diagnosis Date   Anemia    Blood transfusion without reported diagnosis    Cataract    removed bilateral   Chronic cystitis    Diverticulitis    Heart murmur    Hyperlipidemia    Hypertension    Personal history of colonic polyps-adenomas 01/07/2012   2009 - 2 diminutive adenomas (prior polyps also) 01/07/2012 - 2 diminutive adenomas        Dispostion: I considered admission for this patient, but she does not meet inpatient criteria for admission is safe for discharge with outpatient follow-up     Final Clinical Impression(s) / ED Diagnoses Final diagnoses:  Dark stools     '@PCDICTATION'$ @    Teressa Lower, MD 10/25/21 [phone]

## 2021-10-29 ENCOUNTER — Inpatient Hospital Stay: Payer: Medicare Other | Attending: Physician Assistant

## 2021-10-29 VITALS — BP 115/68 | HR 87 | Temp 98.2°F | Resp 18

## 2021-10-29 DIAGNOSIS — K921 Melena: Secondary | ICD-10-CM | POA: Diagnosis not present

## 2021-10-29 DIAGNOSIS — D500 Iron deficiency anemia secondary to blood loss (chronic): Secondary | ICD-10-CM | POA: Diagnosis not present

## 2021-10-29 DIAGNOSIS — D509 Iron deficiency anemia, unspecified: Secondary | ICD-10-CM

## 2021-10-29 MED ORDER — SODIUM CHLORIDE 0.9 % IV SOLN: Freq: Once | INTRAVENOUS | Status: AC

## 2021-10-29 MED ORDER — SODIUM CHLORIDE 0.9 % IV SOLN
510.0000 mg | Freq: Once | INTRAVENOUS | Status: AC
  Administered 2021-10-29: 510 mg via INTRAVENOUS
  Filled 2021-10-29: qty 17

## 2021-10-29 NOTE — Progress Notes (Signed)
Pt presents today for Feraheme IV iron nfusion per provider's order. Vital signs stable and pt voiced no new complaints at this time. Pt took pre-meds Tylenol and Claritin at home prior to arrival.  [facility]  given today per MD orders. Tolerated infusion without adverse affects. Vital signs stable. No complaints at this time. Discharged from clinic ambulatory in stable condition. Alert and oriented x 3. F/U with [facility] as scheduled.

## 2021-10-29 NOTE — Patient Instructions (Signed)
[facility]  Discharge Instructions: Thank you for choosing [facility] to provide your oncology and hematology care.  If you have a lab appointment with the [facility], please come in thru the Main Entrance and check in at the main information desk.  Wear comfortable clothing and clothing appropriate for easy access to any Portacath or PICC line.   We strive to give you quality time with your provider. You may need to reschedule your appointment if you arrive late (15 or more minutes).  Arriving late affects you and other patients whose appointments are after yours.  Also, if you miss three or more appointments without notifying the office, you may be dismissed from the clinic at the provider's discretion.      For prescription refill requests, have your pharmacy contact our office and allow 72 hours for refills to be completed.    Today you received Feraheme IV iron infusion.  BELOW ARE SYMPTOMS THAT SHOULD BE REPORTED IMMEDIATELY: *FEVER GREATER THAN 100.4 F (38 C) OR HIGHER *CHILLS OR SWEATING *NAUSEA AND VOMITING THAT IS NOT CONTROLLED WITH YOUR NAUSEA MEDICATION *UNUSUAL SHORTNESS OF BREATH *UNUSUAL BRUISING OR BLEEDING *URINARY PROBLEMS (pain or burning when urinating, or frequent urination) *BOWEL PROBLEMS (unusual diarrhea, constipation, pain near the anus) TENDERNESS IN MOUTH AND THROAT WITH OR WITHOUT PRESENCE OF ULCERS (sore throat, sores in mouth, or a toothache) UNUSUAL RASH, SWELLING OR PAIN  UNUSUAL VAGINAL DISCHARGE OR ITCHING   Items with * indicate a potential emergency and should be followed up as soon as possible or go to the Emergency Department if any problems should occur.  Please show the CHEMOTHERAPY ALERT CARD or IMMUNOTHERAPY ALERT CARD at check-in to the Emergency Department and triage nurse.  Should you have questions after your visit or need to cancel or reschedule your appointment, please contact [facility] [phone]  and follow the prompts.  Office hours are 8:00 a.m. to 4:30 p.m. Monday - Friday. Please note that voicemails left after 4:00 p.m. may not be returned until the following business day.  We are closed weekends and major holidays. You have access to a nurse at all times for urgent questions. Please call the main number to the clinic [phone] and follow the prompts.  For any non-urgent questions, you may also contact your provider using MyChart. We now offer e-Visits for anyone 53 and older to request care online for non-urgent symptoms. For details visit [email or web address].   Also download the MyChart app! Go to the app store, search "MyChart", open the app, select [facility], and log in with your MyChart username and password.  Masks are optional in the cancer centers. If you would like for your care team to wear a mask while they are taking care of you, please let them know. For doctor visits, patients may have with them one support person who is at least 77 years old. At this time, visitors are not allowed in the infusion area.

## 2021-11-08 ENCOUNTER — Other Ambulatory Visit: Payer: Self-pay | Admitting: Physician Assistant

## 2021-11-22 ENCOUNTER — Other Ambulatory Visit: Payer: Medicare Other

## 2021-11-22 DIAGNOSIS — K922 Gastrointestinal hemorrhage, unspecified: Secondary | ICD-10-CM

## 2021-11-22 DIAGNOSIS — D62 Acute posthemorrhagic anemia: Secondary | ICD-10-CM | POA: Diagnosis not present

## 2021-11-22 LAB — CBC
Hematocrit: 35.2 % (ref 34.0–46.6)
Hemoglobin: 11.2 g/dL (ref 11.1–15.9)
MCH: 29 pg (ref 26.6–33.0)
MCHC: 31.8 g/dL (ref 31.5–35.7)
MCV: 91 fL (ref 79–97)
Platelets: 186 10*3/uL (ref 150–450)
RBC: 3.86 x10E6/uL (ref 3.77–5.28)
RDW: 14.8 % (ref 11.7–15.4)
WBC: 5.6 10*3/uL (ref 3.4–10.8)

## 2021-12-03 NOTE — Progress Notes (Unsigned)
Referring Provider: [name], DO Primary Care Physician:  [name], DO Primary GI Physician: Dr. Abbey Chatters  No chief complaint on file.   HPI:   Stacey Reyes is a 76 y.o. female presenting today for follow-up.  She has history of IDA in setting of chronic GI bleeding, known AVMs, multiple admissions requiring blood transfusions and multiple endoscopic procedures over past few years including multiple EGDs, colonoscopies, capsule studies, and 2 IR interventions in September 2022 for dieulafoy lesion.  She did well following IR interventions until the middle of 2023 when she developed recurrent melena, acute on chronic symptomatic anemia requiring admission and transfusion. EGD/enteroscopy and capsule study in early July with no suspect lesion identified. Last admitted on 7/15 with melena and symptomatic anemia. Repeat EGD 7/16 with actively bleeding distal duodenal mucosa consistent with dieulafoy s/p placement of 4 hemostatic clips, but 1 fell off. She required 2 units PRBCs during that admission with discharge hemoglobin 7.9.  She was seen in the emergency room on 10/25/2021 for evaluation of dark stools.  She had resumed taking oral iron about 1 month prior.  CBC with hemoglobin 10.5.  She follows with hematology and has received 2 IV iron infusions since hospitalization.  Last infusion on 10/29/2021.   Most recent labs 11/22/21 with hemoglobin improved to 11.2.  Today:    Past Medical History:  Diagnosis Date   Anemia    Blood transfusion without reported diagnosis    Cataract    removed bilateral   Chronic cystitis    Diverticulitis    Heart murmur    Hyperlipidemia    Hypertension    Personal history of colonic polyps-adenomas 01/07/2012   2009 - 2 diminutive adenomas (prior polyps also) 01/07/2012 - 2 diminutive adenomas      Past Surgical History:  Procedure Laterality Date   BREAST BIOPSY Right    No Scar seen    COLONOSCOPY  multiple    ENTEROSCOPY N/A 09/26/2021   Procedure: ENTEROSCOPY;  Surgeon: Gatha Mayer, MD;  Location: [place];  Service: Gastroenterology;  Laterality: N/A;   ESOPHAGOGASTRODUODENOSCOPY N/A 10/07/2021   Procedure: ESOPHAGOGASTRODUODENOSCOPY (EGD);  Surgeon: Daneil Dolin, MD;  [facility];  Service: Endoscopy;  Laterality: N/A;  EGD with enteroscopy  with pediatric colonoscope   ESOPHAGOGASTRODUODENOSCOPY (EGD) WITH PROPOFOL N/A 11/23/2019   Procedure: ESOPHAGOGASTRODUODENOSCOPY (EGD) WITH PROPOFOL;  Surgeon: Daneil Dolin, MD;  [facility];  Service: Endoscopy;  Laterality: N/A;   ESOPHAGOGASTRODUODENOSCOPY (EGD) WITH PROPOFOL N/A 10/20/2020   Procedure: ESOPHAGOGASTRODUODENOSCOPY (EGD) WITH PROPOFOL;  Surgeon: Eloise Harman, DO;  [facility];  Service: Endoscopy;  Laterality: N/A;   ESOPHAGOGASTRODUODENOSCOPY (EGD) WITH PROPOFOL N/A 12/05/2020   Procedure: ESOPHAGOGASTRODUODENOSCOPY (EGD) WITH PROPOFOL;  Surgeon: Rogene Houston, MD;  [facility];  Service: Endoscopy;  Laterality: N/A;  with enteroscopy   ESOPHAGOGASTRODUODENOSCOPY (EGD) WITH PROPOFOL N/A 12/11/2020   Procedure: ESOPHAGOGASTRODUODENOSCOPY (EGD) WITH PROPOFOL;  Surgeon: Eloise Harman, DO;  [facility];  Service: Endoscopy;  Laterality: N/A;   ESOPHAGOGASTRODUODENOSCOPY (EGD) WITH PROPOFOL N/A 09/17/2021   Procedure: ESOPHAGOGASTRODUODENOSCOPY (EGD) WITH PROPOFOL;  Surgeon: Eloise Harman, DO;  [facility];  Service: Endoscopy;  Laterality: N/A;   GIVENS CAPSULE STUDY N/A 11/23/2019   Procedure: GIVENS CAPSULE STUDY;  Surgeon: Daneil Dolin, MD;  [facility];  Service: Endoscopy;  Laterality: N/A;   GIVENS CAPSULE STUDY N/A 10/22/2020   Procedure: GIVENS CAPSULE  STUDY;  Surgeon: Eloise Harman, DO;  [facility];  Service: Endoscopy;  Laterality: N/A;   GIVENS CAPSULE STUDY N/A 09/26/2021   Procedure: GIVENS CAPSULE STUDY;   Surgeon: Gatha Mayer, MD;  Location: [place];  Service: Gastroenterology;  Laterality: N/A;   HEMOSTASIS CLIP PLACEMENT  12/11/2020   Procedure: HEMOSTASIS CLIP PLACEMENT;  Surgeon: Eloise Harman, DO;  [facility];  Service: Endoscopy;;   IR ANGIOGRAM SELECTIVE EACH ADDITIONAL VESSEL  12/05/2020   IR ANGIOGRAM SELECTIVE EACH ADDITIONAL VESSEL  12/05/2020   IR ANGIOGRAM SELECTIVE EACH ADDITIONAL VESSEL  12/05/2020   IR ANGIOGRAM SELECTIVE EACH ADDITIONAL VESSEL  12/13/2020   IR ANGIOGRAM SELECTIVE EACH ADDITIONAL VESSEL  12/13/2020   IR ANGIOGRAM VISCERAL SELECTIVE  12/05/2020   IR ANGIOGRAM VISCERAL SELECTIVE  12/13/2020   IR EMBO ART  VEN HEMORR LYMPH EXTRAV  INC GUIDE ROADMAPPING  12/05/2020   IR EMBO ART  VEN HEMORR LYMPH EXTRAV  INC GUIDE ROADMAPPING  12/13/2020   IR RADIOLOGIST EVAL & MGMT  01/11/2021   IR US GUIDE VASC ACCESS LEFT  12/13/2020   IR US GUIDE VASC ACCESS RIGHT  12/05/2020   SUBMUCOSAL TATTOO INJECTION  09/26/2021   Procedure: SUBMUCOSAL TATTOO INJECTION;  Surgeon: Gatha Mayer, MD;  Location: [facility];  Service: Gastroenterology;;    Current Outpatient Medications  Medication Sig Dispense Refill   Ascorbic Acid (VITAMIN C PO) Take 1 tablet by mouth daily.     CALCIUM PO Take 1 tablet by mouth daily.     Cholecalciferol (VITAMIN D-3 PO) Take 1 capsule by mouth daily.     COPPER PO Take by mouth.     cyanocobalamin (CYANOCOBALAMIN) 500 MCG tablet Take 500 mcg by mouth daily.     ferrous sulfate 325 (65 FE) MG tablet Take 1 tablet (325 mg total) by mouth daily. 30 tablet 3   levocetirizine (XYZAL) 5 MG tablet TAKE 1 TABLET (5 MG TOTAL) BY MOUTH AT BEDTIME AS NEEDED FOR ALLERGIES (DRAINAGE). 90 tablet 1   MAGNESIUM PO Take 1 tablet by mouth daily.     pantoprazole (PROTONIX) 40 MG tablet Take 1 tablet (40 mg total) by mouth 2 (two) times daily. 60 tablet 1   rosuvastatin (CRESTOR) 10 MG tablet Take 1 tablet (10 mg total) by mouth at bedtime. 90 tablet 3    traZODone (DESYREL) 50 MG tablet Take 1 tablet (50 mg total) by mouth at bedtime as needed for sleep. 90 tablet 3   VITAMIN E PO Take 1 tablet by mouth daily.     No current facility-administered medications for this visit.    Allergies as of 12/05/2021 - Review Complete 10/29/2021  Allergen Reaction Noted   Sulfa antibiotics Rash 04/29/2019    Family History  Problem Relation Age of Onset   Colon cancer Mother 45       80's   Breast cancer Sister    Asthma Brother    Colon polyps Neg Hx    Esophageal cancer Neg Hx    Rectal cancer Neg Hx    Stomach cancer Neg Hx     Social History   Socioeconomic History   Marital status: Married    Spouse name: Not on file   Number of children: 1   Years of education: Not on file   Highest education level: Not on file  Occupational History   Occupation: retired  Tobacco Use   Smoking status: Former    Types: Cigarettes  Quit date: 12/23/1984    Years since quitting: 36.9   Smokeless tobacco: Never  Vaping Use   Vaping Use: Never used  Substance and Sexual Activity   Alcohol use: Yes    Comment: wine occasionally   Drug use: No   Sexual activity: Not on file  Other Topics Concern   Not on file  Social History Narrative   Patient is married and retired and has 1 grown child   Social Determinants of Radio broadcast assistant Strain: [place]  (03/21/2021)   Overall Financial Resource Strain (CARDIA)    Difficulty of Paying Living Expenses: Not hard at all  Food Insecurity: No Food Insecurity (03/21/2021)   Hunger Vital Sign    Worried About Running Out of Food in the Last Year: Never true    Ran Out of Food in the Last Year: Never true  Transportation Needs: No Transportation Needs (03/21/2021)   PRAPARE - Hydrologist (Medical): No    Lack of Transportation (Non-Medical): No  Physical Activity: Sufficiently Active (03/21/2021)   Exercise Vital Sign    Days of Exercise per Week: 1 day     Minutes of Exercise per Session: 150+ min  Stress: No Stress Concern Present (03/21/2021)   [place]    Feeling of Stress : Only a little  Social Connections: Moderately Integrated (03/21/2021)   Social Connection and Isolation Panel [NHANES]    Frequency of Communication with Friends and Family: More than three times a week    Frequency of Social Gatherings with Friends and Family: More than three times a week    Attends Religious Services: More than 4 times per year    Active Member of [employer] or Organizations: No    Attends Archivist Meetings: Never    Marital Status: Married    Review of Systems: Gen: Denies fever, chills, cold or flu like symptoms, pre-syncope, or syncope.   CV: Denies chest pain, palpitations. Resp: Denies dyspnea, cough.  GI: See HPI Heme: See HPI  Physical Exam: There were no vitals taken for this visit. General:   Alert and oriented. No distress noted. Pleasant and cooperative.  Head:  Normocephalic and atraumatic. Eyes:  Conjuctiva clear without scleral icterus. Heart:  S1, S2 present without murmurs appreciated. Lungs:  Clear to auscultation bilaterally. No wheezes, rales, or rhonchi. No distress.  Abdomen:  +BS, soft, non-tender and non-distended. No rebound or guarding. No HSM or masses noted. Msk:  Symmetrical without gross deformities. Normal posture. Extremities:  Without edema. Neurologic:  Alert and  oriented x4 Psych:  Normal mood and affect.    Assessment:     Plan:  ***   Aliene Altes, PA-C [facility] 12/05/2021

## 2021-12-05 ENCOUNTER — Ambulatory Visit (INDEPENDENT_AMBULATORY_CARE_PROVIDER_SITE_OTHER): Payer: Medicare Other | Admitting: Gastroenterology

## 2021-12-05 ENCOUNTER — Encounter: Payer: Self-pay | Admitting: Gastroenterology

## 2021-12-05 VITALS — BP 120/66 | HR 85 | Temp 97.7°F | Ht 66.0 in | Wt 198.2 lb

## 2021-12-05 DIAGNOSIS — D500 Iron deficiency anemia secondary to blood loss (chronic): Secondary | ICD-10-CM | POA: Diagnosis not present

## 2021-12-05 DIAGNOSIS — Z86010 Personal history of colonic polyps: Secondary | ICD-10-CM | POA: Diagnosis not present

## 2021-12-05 NOTE — Patient Instructions (Signed)
Decrease Protonix to 40 mg once daily.   Continue oral iron for now.   Continue to follow closely with hematology for monitoring of your hemoglobin and for IV iron as needed.   You will ne due for a colonoscopy in November 2024.   We will plan to see you back in 6 months or sooner if needed.   It was great to see you again today! I am glad  you are doing well!   Aliene Altes, PA-C [facility]

## 2021-12-18 ENCOUNTER — Observation Stay (HOSPITAL_BASED_OUTPATIENT_CLINIC_OR_DEPARTMENT_OTHER)
Admission: EM | Admit: 2021-12-18 | Discharge: 2021-12-20 | Disposition: A | Discharge status: Home / Self Care | Payer: Medicare Other | Source: Home / Self Care | Attending: Emergency Medicine | Admitting: Emergency Medicine

## 2021-12-18 ENCOUNTER — Other Ambulatory Visit: Payer: Self-pay | Admitting: *Deleted

## 2021-12-18 ENCOUNTER — Encounter (HOSPITAL_COMMUNITY): Payer: Self-pay | Admitting: *Deleted

## 2021-12-18 ENCOUNTER — Other Ambulatory Visit: Payer: Self-pay

## 2021-12-18 ENCOUNTER — Inpatient Hospital Stay (HOSPITAL_COMMUNITY): Payer: Medicare Other | Attending: Physician Assistant

## 2021-12-18 ENCOUNTER — Telehealth: Payer: Self-pay | Admitting: *Deleted

## 2021-12-18 ENCOUNTER — Emergency Department (HOSPITAL_COMMUNITY): Payer: Medicare Other

## 2021-12-18 DIAGNOSIS — I10 Essential (primary) hypertension: Secondary | ICD-10-CM | POA: Insufficient documentation

## 2021-12-18 DIAGNOSIS — D62 Acute posthemorrhagic anemia: Secondary | ICD-10-CM | POA: Diagnosis not present

## 2021-12-18 DIAGNOSIS — Z87891 Personal history of nicotine dependence: Secondary | ICD-10-CM | POA: Insufficient documentation

## 2021-12-18 DIAGNOSIS — D500 Iron deficiency anemia secondary to blood loss (chronic): Secondary | ICD-10-CM

## 2021-12-18 DIAGNOSIS — E782 Mixed hyperlipidemia: Secondary | ICD-10-CM | POA: Insufficient documentation

## 2021-12-18 DIAGNOSIS — D649 Anemia, unspecified: Secondary | ICD-10-CM | POA: Diagnosis not present

## 2021-12-18 DIAGNOSIS — K219 Gastro-esophageal reflux disease without esophagitis: Secondary | ICD-10-CM | POA: Diagnosis not present

## 2021-12-18 DIAGNOSIS — E669 Obesity, unspecified: Secondary | ICD-10-CM | POA: Diagnosis present

## 2021-12-18 DIAGNOSIS — R0602 Shortness of breath: Secondary | ICD-10-CM | POA: Diagnosis not present

## 2021-12-18 DIAGNOSIS — Z79899 Other long term (current) drug therapy: Secondary | ICD-10-CM | POA: Insufficient documentation

## 2021-12-18 DIAGNOSIS — K921 Melena: Secondary | ICD-10-CM | POA: Insufficient documentation

## 2021-12-18 DIAGNOSIS — E538 Deficiency of other specified B group vitamins: Secondary | ICD-10-CM

## 2021-12-18 DIAGNOSIS — K922 Gastrointestinal hemorrhage, unspecified: Secondary | ICD-10-CM | POA: Insufficient documentation

## 2021-12-18 DIAGNOSIS — K317 Polyp of stomach and duodenum: Secondary | ICD-10-CM | POA: Insufficient documentation

## 2021-12-18 LAB — CBC WITH DIFFERENTIAL/PLATELET
Abs Immature Granulocytes: 0.03 10*3/uL (ref 0.00–0.07)
Abs Immature Granulocytes: 0.01 10*3/uL (ref 0.00–0.07)
Basophils Absolute: 0.1 10*3/uL (ref 0.0–0.1)
Basophils Absolute: 0.1 10*3/uL (ref 0.0–0.1)
Basophils Relative: 1 %
Basophils Relative: 1 %
Eosinophils Absolute: 0.3 10*3/uL (ref 0.0–0.5)
Eosinophils Absolute: 0.2 10*3/uL (ref 0.0–0.5)
Eosinophils Relative: 4 %
Eosinophils Relative: 3 %
HCT: 25 % — ABNORMAL LOW (ref 36.0–46.0)
HCT: 23.9 % — ABNORMAL LOW (ref 36.0–46.0)
Hemoglobin: 7.8 g/dL — ABNORMAL LOW (ref 12.0–15.0)
Hemoglobin: 7.5 g/dL — ABNORMAL LOW (ref 12.0–15.0)
Immature Granulocytes: 1 %
Immature Granulocytes: 0 %
Lymphocytes Relative: 26 %
Lymphocytes Relative: 28 %
Lymphs Abs: 1.7 10*3/uL (ref 0.7–4.0)
Lymphs Abs: 1.8 10*3/uL (ref 0.7–4.0)
MCH: 30.1 pg (ref 26.0–34.0)
MCH: 30.4 pg (ref 26.0–34.0)
MCHC: 31.2 g/dL (ref 30.0–36.0)
MCHC: 31.4 g/dL (ref 30.0–36.0)
MCV: 96.5 fL (ref 80.0–100.0)
MCV: 96.8 fL (ref 80.0–100.0)
Monocytes Absolute: 0.6 10*3/uL (ref 0.1–1.0)
Monocytes Absolute: 0.7 10*3/uL (ref 0.1–1.0)
Monocytes Relative: 9 %
Monocytes Relative: 11 %
Neutro Abs: 3.8 10*3/uL (ref 1.7–7.7)
Neutro Abs: 3.7 10*3/uL (ref 1.7–7.7)
Neutrophils Relative %: 59 %
Neutrophils Relative %: 57 %
Platelets: 239 10*3/uL (ref 150–400)
Platelets: 223 10*3/uL (ref 150–400)
RBC: 2.59 MIL/uL — ABNORMAL LOW (ref 3.87–5.11)
RBC: 2.47 MIL/uL — ABNORMAL LOW (ref 3.87–5.11)
RDW: 16.5 % — ABNORMAL HIGH (ref 11.5–15.5)
RDW: 16.6 % — ABNORMAL HIGH (ref 11.5–15.5)
WBC: 6.5 10*3/uL (ref 4.0–10.5)
WBC: 6.5 10*3/uL (ref 4.0–10.5)
nRBC: 0 % (ref 0.0–0.2)
nRBC: 0 % (ref 0.0–0.2)

## 2021-12-18 LAB — BASIC METABOLIC PANEL
Anion gap: 8 (ref 5–15)
BUN: 18 mg/dL (ref 8–23)
CO2: 25 mmol/L (ref 22–32)
Calcium: 8.6 mg/dL — ABNORMAL LOW (ref 8.9–10.3)
Chloride: 104 mmol/L (ref 98–111)
Creatinine, Ser: 0.83 mg/dL (ref 0.44–1.00)
GFR, Estimated: >60 mL/min (ref >60)
Glucose, Bld: 120 mg/dL — ABNORMAL HIGH (ref 70–99)
Potassium: 3.8 mmol/L (ref 3.5–5.1)
Sodium: 137 mmol/L (ref 135–145)

## 2021-12-18 LAB — IRON AND TIBC
Iron: 166 ug/dL (ref 28–170)
Saturation Ratios: 41 % — ABNORMAL HIGH (ref 10.4–31.8)
TIBC: 402 ug/dL (ref 250–450)
UIBC: 236 ug/dL

## 2021-12-18 LAB — PREPARE RBC (CROSSMATCH)

## 2021-12-18 LAB — VITAMIN B12: Vitamin B-12: 341 pg/mL (ref 180–914)

## 2021-12-18 LAB — FERRITIN: Ferritin: 19 ng/mL (ref 11–307)

## 2021-12-18 LAB — TROPONIN I (HIGH SENSITIVITY): Troponin I (High Sensitivity): 18 ng/L — ABNORMAL HIGH (ref <18)

## 2021-12-18 LAB — POC OCCULT BLOOD, ED: Fecal Occult Bld: POSITIVE — AB

## 2021-12-18 MED ORDER — BISOPROLOL FUMARATE 5 MG PO TABS
5.0000 mg | ORAL_TABLET | Freq: Every day | ORAL | Status: DC
  Administered 2021-12-19 – 2021-12-20 (×2): 5 mg via ORAL
  Filled 2021-12-18 (×2): qty 1

## 2021-12-18 MED ORDER — VITAMIN B-12 100 MCG PO TABS
500.0000 ug | ORAL_TABLET | Freq: Every day | ORAL | Status: DC
  Administered 2021-12-19 – 2021-12-20 (×2): 500 ug via ORAL
  Filled 2021-12-18 (×3): qty 5

## 2021-12-18 MED ORDER — ACETAMINOPHEN 650 MG RE SUPP: 650.0000 mg | Freq: Four times a day (QID) | RECTAL | Status: DC | PRN

## 2021-12-18 MED ORDER — PANTOPRAZOLE SODIUM 40 MG IV SOLR
40.0000 mg | Freq: Once | INTRAVENOUS | Status: AC
  Administered 2021-12-18: 40 mg via INTRAVENOUS
  Filled 2021-12-18: qty 10

## 2021-12-18 MED ORDER — PANTOPRAZOLE INFUSION (NEW) - SIMPLE MED
8.0000 mg/h | INTRAVENOUS | Status: DC
  Administered 2021-12-18 – 2021-12-19 (×2): 8 mg/h via INTRAVENOUS
  Filled 2021-12-18 (×4): qty 100
  Filled 2021-12-18: qty 80
  Filled 2021-12-18 (×3): qty 100

## 2021-12-18 MED ORDER — ROSUVASTATIN CALCIUM 20 MG PO TABS
10.0000 mg | ORAL_TABLET | Freq: Every day | ORAL | Status: DC
  Administered 2021-12-19: 10 mg via ORAL
  Filled 2021-12-18: qty 1

## 2021-12-18 MED ORDER — FERROUS SULFATE 325 (65 FE) MG PO TABS
325.0000 mg | ORAL_TABLET | Freq: Every day | ORAL | Status: DC
  Administered 2021-12-19 – 2021-12-20 (×2): 325 mg via ORAL
  Filled 2021-12-18 (×2): qty 1

## 2021-12-18 MED ORDER — SODIUM CHLORIDE 0.9 % IV SOLN
10.0000 mL/h | Freq: Once | INTRAVENOUS | Status: AC
  Administered 2021-12-18: 10 mL/h via INTRAVENOUS

## 2021-12-18 MED ORDER — ACETAMINOPHEN 325 MG PO TABS
650.0000 mg | ORAL_TABLET | Freq: Four times a day (QID) | ORAL | Status: DC | PRN
  Administered 2021-12-19: 650 mg via ORAL
  Filled 2021-12-18: qty 2

## 2021-12-18 MED ORDER — PANTOPRAZOLE SODIUM 40 MG IV SOLR: 40.0000 mg | Freq: Two times a day (BID) | INTRAVENOUS | Status: DC

## 2021-12-18 NOTE — ED Notes (Signed)
Lab delay in obtaining second troponin due to pt receiving blood

## 2021-12-18 NOTE — Telephone Encounter (Signed)
Patient contacted regarding decrease in HGB and profound SOB.  She states that stools are dark due to iron therapy, so she has no way of knowing if she is bleeding.  In the setting of her GI blood loss history, case discussed with Tarri Abernethy PAC and Flossie Dibble.  Per both practitioners, patient has been advised to go to the ER to be evaluated.

## 2021-12-18 NOTE — ED Provider Notes (Signed)
[facility] EMERGENCY DEPARTMENT Provider Note   CSN: [id number] Arrival date & time: 12/18/21  1659     History {Add pertinent medical, surgical, social history, OB history to HPI:1} Chief Complaint  Patient presents with   Abnormal Lab    Stacey Reyes is a 76 y.o. female.  She has a history of cardiac disease and GI bleeding, has required transfusions.  She said she had labs done today that showed her hemoglobin had dropped.  She has been noticing some shortness of breath and dyspnea on exertion and some achiness in her muscles.  She denies any abdominal pain and has not seen any rectal bleeding.  She is not on blood thinners.  She said she has had multiple endoscopies and colonoscopies.   The history is provided by the patient.  Abnormal Lab Time since result:  Today Patient referred by:  PCP Result type: hematology   Hematology:    Hematocrit:  Low   Hemoglobin:  Low Shortness of Breath Severity:  Moderate Onset quality:  Gradual Duration:  3 days Timing:  Intermittent Progression:  Unchanged Chronicity:  Recurrent Context: activity   Relieved by:  Rest Worsened by:  Activity Ineffective treatments:  None tried Associated symptoms: no abdominal pain, no chest pain, no cough, no fever, no hemoptysis, no sputum production and no vomiting        Home Medications Prior to Admission medications   Medication Sig Start Date End Date Taking? Authorizing Provider  Ascorbic Acid (VITAMIN C PO) Take 1 tablet by mouth daily.    [provider]  bisoprolol (ZEBETA) 5 MG tablet Take 5 mg by mouth daily.    [provider]  CALCIUM PO Take 1 tablet by mouth daily.    [provider]  Cholecalciferol (VITAMIN D-3 PO) Take 1 capsule by mouth daily.    [provider]  COPPER PO Take by mouth.    [provider]  cyanocobalamin (CYANOCOBALAMIN) 500 MCG tablet Take 500 mcg by mouth daily.    [provider]  ferrous sulfate  325 (65 FE) MG tablet Take 1 tablet (325 mg total) by mouth daily. 09/27/21 09/27/22  Patrecia Pour, MD  levocetirizine (XYZAL) 5 MG tablet TAKE 1 TABLET (5 MG TOTAL) BY MOUTH AT BEDTIME AS NEEDED FOR ALLERGIES (DRAINAGE). 09/03/21   [name], DO  MAGNESIUM PO Take 1 tablet by mouth daily.    [provider]  pantoprazole (PROTONIX) 40 MG tablet Take 1 tablet (40 mg total) by mouth 2 (two) times daily. 09/19/21   Orson Eva, MD  rosuvastatin (CRESTOR) 10 MG tablet Take 1 tablet (10 mg total) by mouth at bedtime. 06/11/21   [name], DO  traZODone (DESYREL) 50 MG tablet Take 1 tablet (50 mg total) by mouth at bedtime as needed for sleep. 04/02/21   [name], DO  VITAMIN E PO Take 1 tablet by mouth daily.    [provider]      Allergies    Sulfa antibiotics    Review of Systems   Review of Systems  Constitutional:  Negative for fever.  Respiratory:  Positive for shortness of breath. Negative for cough, hemoptysis and sputum production.   Cardiovascular:  Negative for chest pain.  Gastrointestinal:  Negative for abdominal pain and vomiting.  Musculoskeletal:  Positive for myalgias.    Physical Exam Updated Vital Signs Ht '5\' 6"'$  (1.676 m)   Wt 89.8 kg   BMI 31.96 kg/m  Physical  Exam Vitals and nursing note reviewed.  Constitutional:      General: She is not in acute distress.    Appearance: Normal appearance. She is well-developed.  HENT:     Head: Normocephalic and atraumatic.  Eyes:     Conjunctiva/sclera: Conjunctivae normal.  Cardiovascular:     Rate and Rhythm: Normal rate and regular rhythm.     Heart sounds: Murmur heard.  Pulmonary:     Effort: Pulmonary effort is normal. No respiratory distress.     Breath sounds: Normal breath sounds.  Abdominal:     Palpations: Abdomen is soft.     Tenderness: There is no abdominal tenderness. There is no guarding or rebound.  Musculoskeletal:        General: Normal range of motion.      Cervical back: Neck supple.     Right lower leg: No edema.     Left lower leg: No edema.  Skin:    General: Skin is warm and dry.     Capillary Refill: Capillary refill takes less than 2 seconds.  Neurological:     General: No focal deficit present.     Mental Status: She is alert.     ED Results / Procedures / Treatments   Labs (all labs ordered are listed, but only abnormal results are displayed) Labs Reviewed - No data to display  EKG None  Radiology No results found.  Procedures Procedures  {Document cardiac monitor, telemetry assessment procedure when appropriate:1}  Medications Ordered in ED Medications - No data to display  ED Course/ Medical Decision Making/ A&P                           Medical Decision Making Amount and/or Complexity of Data Reviewed Labs: ordered. Radiology: ordered.   This patient complains of ***; this involves an extensive number of treatment Options and is a complaint that carries with it a high risk of complications and morbidity. The differential includes ***  I ordered, reviewed and interpreted labs, which included *** I ordered medication *** and reviewed PMP when indicated. I ordered imaging studies which included *** and I independently    visualized and interpreted imaging which showed *** Additional history obtained from *** Previous records obtained and reviewed *** I consulted *** and discussed lab and imaging findings and discussed disposition.  Cardiac monitoring reviewed, *** Social determinants considered, *** Critical Interventions: ***  After the interventions stated above, I reevaluated the patient and found *** Admission and further testing considered, ***   {Document critical care time when appropriate:1} {Document review of labs and clinical decision tools ie heart score, Chads2Vasc2 etc:1}  {Document your independent review of radiology images, and any outside records:1} {Document your discussion with  family members, caretakers, and with consultants:1} {Document social determinants of health affecting pt's care:1} {Document your decision making why or why not admission, treatments were needed:1} Final Clinical Impression(s) / ED Diagnoses Final diagnoses:  None    Rx / DC Orders ED Discharge Orders     None

## 2021-12-18 NOTE — ED Triage Notes (Signed)
Pt had lab drawn earlier today and was told to come back due to Hgb is 7.8.  pt with hx of low Hgb in the past. Pt takes iron and not able to tell if blood is in her stool.  +SOB with exertion and fatigue.

## 2021-12-18 NOTE — H&P (Addendum)
History and Physical    Patient: Stacey Reyes RCV:[id number] DOB: [birth year] DOA: 12/18/2021 DOS: the patient was seen and examined on 12/18/2021 PCP: [name], DO  Patient coming from: Home  Chief Complaint:  Chief Complaint  Patient presents with   Abnormal Lab   HPI: Stacey Reyes is a 76 y.o. female with medical history significant of hypertension, hyperlipidemia, chronic blood loss anemia with prior blood transfusions, diverticulitis who presents to the emergency department due to low hemoglobin.  Patient complaining of 1 week onset of increasing and worsening shortness of breath.  Shortness of breath worsens on exertion and is associated with muscle aches.  She went for routine blood work today and hemoglobin was noted to have dropped to 7.8, this was 11.2 on 11/22/2021, so she went to an urgent care where she was directed to go to the ED for further evaluation and management.  Patient is chronically on iron, so her stool was always black and was difficult for her to know if she has acute ongoing GI bleed.  She denies use of NSAIDs including Goody powder, she denies use of tobacco, alcohol or any other recreational drug use.  ED Course:  In the emergency department, she was hemodynamically stable.  Work-up in the ED showed H/H of 7.5/23.9, this was 7.8/25.01 blood culture was done earlier this morning.  BMP was normal except for blood glucose of 120, FOBT was positive, troponin x118, vitamin B12 341. Chest x-ray showed stable cardiomegaly without evidence of acute or active cardiopulmonary disease. IV Protonix 40 mg x 1 was given.  Gastroenterologist was consulted and recommended admitting patient with n.p.o. at midnight and will plan to see patient in the morning.  Hospitalist was asked to admit patient for further evaluation and management.  Review of Systems: Review of systems as noted in the HPI. All other systems reviewed and are negative.   Past Medical History:   Diagnosis Date   Anemia    Blood transfusion without reported diagnosis    Cataract    removed bilateral   Chronic cystitis    Diverticulitis    Heart murmur    Hyperlipidemia    Hypertension    Personal history of colonic polyps-adenomas 01/07/2012   2009 - 2 diminutive adenomas (prior polyps also) 01/07/2012 - 2 diminutive adenomas     Past Surgical History:  Procedure Laterality Date   BREAST BIOPSY Right    No Scar seen    COLONOSCOPY  01/2020   Dr. Hilarie Fredrickson; Five 3 to 9 mm polyps in the ascending colon, Two 4 to 5 mm polyps in the transverse colon, diverticulosis, eryethema and petechia in left colon biopsied, external and internal hemorrhoids. Pathology with tubular adenomas. Left colon biopsy was benign. Recommended 3 year surveillance.   ENTEROSCOPY N/A 09/26/2021   Procedure: ENTEROSCOPY;  Surgeon: Gatha Mayer, MD;  Location: [place];  Service: Gastroenterology;  Laterality: N/A;   ESOPHAGOGASTRODUODENOSCOPY N/A 10/07/2021   Procedure: ESOPHAGOGASTRODUODENOSCOPY (EGD);  Surgeon: Daneil Dolin, MD;  [facility];  Service: Endoscopy;  Laterality: N/A;  EGD with enteroscopy  with pediatric colonoscope   ESOPHAGOGASTRODUODENOSCOPY (EGD) WITH PROPOFOL N/A 11/23/2019   Procedure: ESOPHAGOGASTRODUODENOSCOPY (EGD) WITH PROPOFOL;  Surgeon: Daneil Dolin, MD;  [facility];  Service: Endoscopy;  Laterality: N/A;   ESOPHAGOGASTRODUODENOSCOPY (EGD) WITH PROPOFOL N/A 10/20/2020   Procedure: ESOPHAGOGASTRODUODENOSCOPY (EGD) WITH PROPOFOL;  Surgeon: Eloise Harman, DO;  [facility];  Service: Endoscopy;  Laterality: N/A;  ESOPHAGOGASTRODUODENOSCOPY (EGD) WITH PROPOFOL N/A 12/05/2020   Procedure: ESOPHAGOGASTRODUODENOSCOPY (EGD) WITH PROPOFOL;  Surgeon: Rogene Houston, MD;  [facility];  Service: Endoscopy;  Laterality: N/A;  with enteroscopy   ESOPHAGOGASTRODUODENOSCOPY (EGD) WITH PROPOFOL N/A 12/11/2020   Procedure:  ESOPHAGOGASTRODUODENOSCOPY (EGD) WITH PROPOFOL;  Surgeon: Eloise Harman, DO;  [facility];  Service: Endoscopy;  Laterality: N/A;   ESOPHAGOGASTRODUODENOSCOPY (EGD) WITH PROPOFOL N/A 09/17/2021   Procedure: ESOPHAGOGASTRODUODENOSCOPY (EGD) WITH PROPOFOL;  Surgeon: Eloise Harman, DO;  [facility];  Service: Endoscopy;  Laterality: N/A;   GIVENS CAPSULE STUDY N/A 11/23/2019   Procedure: GIVENS CAPSULE STUDY;  Surgeon: Daneil Dolin, MD;  [facility];  Service: Endoscopy;  Laterality: N/A;   GIVENS CAPSULE STUDY N/A 10/22/2020   Procedure: GIVENS CAPSULE STUDY;  Surgeon: Eloise Harman, DO;  [facility];  Service: Endoscopy;  Laterality: N/A;   GIVENS CAPSULE STUDY N/A 09/26/2021   Procedure: GIVENS CAPSULE STUDY;  Surgeon: Gatha Mayer, MD;  Location: [place];  Service: Gastroenterology;  Laterality: N/A;   HEMOSTASIS CLIP PLACEMENT  12/11/2020   Procedure: HEMOSTASIS CLIP PLACEMENT;  Surgeon: Eloise Harman, DO;  [facility];  Service: Endoscopy;;   IR ANGIOGRAM SELECTIVE EACH ADDITIONAL VESSEL  12/05/2020   IR ANGIOGRAM SELECTIVE EACH ADDITIONAL VESSEL  12/05/2020   IR ANGIOGRAM SELECTIVE EACH ADDITIONAL VESSEL  12/05/2020   IR ANGIOGRAM SELECTIVE EACH ADDITIONAL VESSEL  12/13/2020   IR ANGIOGRAM SELECTIVE EACH ADDITIONAL VESSEL  12/13/2020   IR ANGIOGRAM VISCERAL SELECTIVE  12/05/2020   IR ANGIOGRAM VISCERAL SELECTIVE  12/13/2020   IR EMBO ART  VEN HEMORR LYMPH EXTRAV  INC GUIDE ROADMAPPING  12/05/2020   IR EMBO ART  VEN HEMORR LYMPH EXTRAV  INC GUIDE ROADMAPPING  12/13/2020   IR RADIOLOGIST EVAL & MGMT  01/11/2021   IR US GUIDE VASC ACCESS LEFT  12/13/2020   IR US GUIDE VASC ACCESS RIGHT  12/05/2020   SUBMUCOSAL TATTOO INJECTION  09/26/2021   Procedure: SUBMUCOSAL TATTOO INJECTION;  Surgeon: Gatha Mayer, MD;  Location: [facility];  Service: Gastroenterology;;    Social History:  reports that she  quit smoking about 37 years ago. Her smoking use included cigarettes. She has never used smokeless tobacco. She reports current alcohol use. She reports that she does not use drugs.   Allergies  Allergen Reactions   Sulfa Antibiotics Rash    Family History  Problem Relation Age of Onset   Colon cancer Mother 3       80's   Breast cancer Sister    Asthma Brother    Colon polyps Neg Hx    Esophageal cancer Neg Hx    Rectal cancer Neg Hx    Stomach cancer Neg Hx      Prior to Admission medications   Medication Sig Start Date End Date Taking? Authorizing Provider  acetaminophen (TYLENOL) 325 MG tablet Take 325 mg by mouth every 6 (six) hours as needed.   Yes [provider]  Ascorbic Acid (VITAMIN C PO) Take 1 tablet by mouth daily.   Yes [provider]  bisoprolol (ZEBETA) 5 MG tablet Take 5 mg by mouth daily.   Yes [provider]  CALCIUM PO Take 1 tablet by mouth daily.   Yes [provider]  Cholecalciferol (VITAMIN D-3 PO) Take 1 capsule by mouth daily.   Yes [provider]  COPPER PO Take by mouth.   Yes  [provider]  cyanocobalamin (CYANOCOBALAMIN) 500 MCG tablet Take 500 mcg by mouth daily.   Yes [provider]  ferrous sulfate 325 (65 FE) MG tablet Take 1 tablet (325 mg total) by mouth daily. 09/27/21 09/27/22 Yes Patrecia Pour, MD  levocetirizine (XYZAL) 5 MG tablet TAKE 1 TABLET (5 MG TOTAL) BY MOUTH AT BEDTIME AS NEEDED FOR ALLERGIES (DRAINAGE). 09/03/21  Yes Gottschalk, Leatrice Jewels M, DO  MAGNESIUM PO Take 1 tablet by mouth daily.   Yes [provider]  pantoprazole (PROTONIX) 40 MG tablet Take 1 tablet (40 mg total) by mouth 2 (two) times daily. Patient taking differently: Take 40 mg by mouth daily. 09/19/21  Yes Tat, Shanon Brow, MD  rosuvastatin (CRESTOR) 10 MG tablet Take 1 tablet (10 mg total) by mouth at bedtime. 06/11/21  Yes Gottschalk, Leatrice Jewels M, DO  traZODone (DESYREL) 50 MG tablet Take 1 tablet (50 mg  total) by mouth at bedtime as needed for sleep. 04/02/21  Yes Gottschalk, Leatrice Jewels M, DO  VITAMIN E PO Take 1 tablet by mouth daily.   Yes [provider]    Physical Exam: BP 116/81   Pulse 77   Temp 98.3 F (36.8 C) (Oral)   Resp 16   Ht '5\' 6"'$  (1.676 m)   Wt 89.8 kg   SpO2 98%   BMI 31.96 kg/m   General: 76 y.o. year-old female well developed well nourished in no acute distress.  Alert and oriented x3. HEENT: NCAT, EOMI Neck: Supple, trachea medial Cardiovascular: Regular rate and rhythm with no rubs or gallops.  No thyromegaly or JVD noted.  No lower extremity edema. 2/4 pulses in all 4 extremities. Respiratory: Clear to auscultation with no wheezes or rales. Good inspiratory effort. Abdomen: Soft, nontender nondistended with normal bowel sounds x4 quadrants. Muskuloskeletal: No cyanosis, clubbing or edema noted bilaterally Neuro: CN II-XII intact, strength 5/5 x 4, sensation, reflexes intact Skin: No ulcerative lesions noted or rashes Psychiatry: Judgement and insight appear normal. Mood is appropriate for condition and setting          Labs on Admission:  Basic Metabolic Panel: Recent Labs  Lab 12/18/21 1807  NA 137  K 3.8  CL 104  CO2 25  GLUCOSE 120*  BUN 18  CREATININE 0.83  CALCIUM 8.6*   Liver Function Tests: No results for input(s): "AST", "ALT", "ALKPHOS", "BILITOT", "PROT", "ALBUMIN" in the last 168 hours. No results for input(s): "LIPASE", "AMYLASE" in the last 168 hours. No results for input(s): "AMMONIA" in the last 168 hours. CBC: Recent Labs  Lab 12/18/21 1259 12/18/21 1807  WBC 6.5 6.5  NEUTROABS 3.8 3.7  HGB 7.8* 7.5*  HCT 25.0* 23.9*  MCV 96.5 96.8  PLT 239 223   Cardiac Enzymes: No results for input(s): "CKTOTAL", "CKMB", "CKMBINDEX", "TROPONINI" in the last 168 hours.  BNP (last 3 results) Recent Labs    09/16/21 1830 10/05/21 1205  BNP 449.0* 423.4*    ProBNP (last 3 results) No results for input(s): "PROBNP" in the  last 8760 hours.  CBG: No results for input(s): "GLUCAP" in the last 168 hours.  Radiological Exams on Admission: DG Chest Port 1 View  Result Date: 12/18/2021 CLINICAL DATA:  Shortness of breath. EXAM: PORTABLE CHEST 1 VIEW COMPARISON:  October 05, 2021 FINDINGS: The cardiac silhouette is mildly enlarged and unchanged in size. Both lungs are clear. Multilevel degenerative changes are seen throughout the thoracic spine. IMPRESSION: Stable cardiomegaly without evidence of acute or active cardiopulmonary disease. Electronically Signed  By: Virgina Norfolk M.D.   On: 12/18/2021 18:06    EKG: I independently viewed the EKG done and my findings are as followed: Normal sinus rhythm at a rate of 82 bpm.  LVH with repolarization abnormality  Assessment/Plan Present on Admission:  GI bleed  Symptomatic anemia  Hypertension, essential  Obesity (BMI 30-39.9)  Iron deficiency anemia due to chronic blood loss  Principal Problem:   GI bleed Active Problems:   Hypertension, essential   Mixed hyperlipidemia   Iron deficiency anemia due to chronic blood loss   Symptomatic anemia   Obesity (BMI 30-39.9)   GERD (gastroesophageal reflux disease)  Acute symptomatic anemia due to GI bleed H/H= 7.5/23.9, this was 11.2 on 11/22/2021 Hemoccult was positive Type and crossmatch was done 1 unit of PRBC was ordered to be transfused in the ED Continue IV Protonix drip Gastroenterologist  was consulted and will see patient in the morning per ED physician  EGD done on 09/17/21 showed: Diaphragmatic hernia without obstruction or gangrene Gastrointestinal hemorrhage, unspecified Acute posthemorrhagic anemia Melena (includes Hematochezia)  Iron deficiency anemia Continue ferrous sulfate  GERD Continue Protonix  Essential hypertension Continue bisoprolol  Mixed hyperlipidemia Continue Crestor  Obesity (BMI 31.96) Diet and lifestyle modification  DVT prophylaxis: SCDs  Code Status: Full  code  Consults: Gastroenterology  Family Communication: None at bedside  Severity of Illness: The appropriate patient status for this patient is OBSERVATION. Observation status is judged to be reasonable and necessary in order to provide the required intensity of service to ensure the patient's safety. The patient's presenting symptoms, physical exam findings, and initial radiographic and laboratory data in the context of their medical condition is felt to place them at decreased risk for further clinical deterioration. Furthermore, it is anticipated that the patient will be medically stable for discharge from the hospital within 2 midnights of admission.    Author: Bernadette Hoit, DO 12/18/2021 9:25 PM  For on call review [email or web address].

## 2021-12-18 NOTE — ED Notes (Signed)
Pt's fecal occult blood was positive

## 2021-12-19 ENCOUNTER — Observation Stay (HOSPITAL_COMMUNITY): Payer: Medicare Other | Admitting: Certified Registered Nurse Anesthetist

## 2021-12-19 ENCOUNTER — Encounter (HOSPITAL_COMMUNITY)
Admission: EM | Disposition: A | Discharge status: Home / Self Care | Payer: Self-pay | Source: Home / Self Care | Attending: Emergency Medicine

## 2021-12-19 ENCOUNTER — Observation Stay (HOSPITAL_BASED_OUTPATIENT_CLINIC_OR_DEPARTMENT_OTHER): Payer: Medicare Other | Admitting: Certified Registered Nurse Anesthetist

## 2021-12-19 ENCOUNTER — Encounter (HOSPITAL_COMMUNITY): Payer: Self-pay | Admitting: Internal Medicine

## 2021-12-19 DIAGNOSIS — I340 Nonrheumatic mitral (valve) insufficiency: Secondary | ICD-10-CM | POA: Diagnosis not present

## 2021-12-19 DIAGNOSIS — D62 Acute posthemorrhagic anemia: Secondary | ICD-10-CM | POA: Diagnosis not present

## 2021-12-19 DIAGNOSIS — K219 Gastro-esophageal reflux disease without esophagitis: Secondary | ICD-10-CM | POA: Diagnosis not present

## 2021-12-19 DIAGNOSIS — I10 Essential (primary) hypertension: Secondary | ICD-10-CM | POA: Diagnosis not present

## 2021-12-19 DIAGNOSIS — Z87891 Personal history of nicotine dependence: Secondary | ICD-10-CM

## 2021-12-19 DIAGNOSIS — K922 Gastrointestinal hemorrhage, unspecified: Secondary | ICD-10-CM | POA: Diagnosis not present

## 2021-12-19 DIAGNOSIS — E782 Mixed hyperlipidemia: Secondary | ICD-10-CM | POA: Diagnosis not present

## 2021-12-19 DIAGNOSIS — D649 Anemia, unspecified: Secondary | ICD-10-CM | POA: Diagnosis not present

## 2021-12-19 DIAGNOSIS — D500 Iron deficiency anemia secondary to blood loss (chronic): Secondary | ICD-10-CM | POA: Diagnosis not present

## 2021-12-19 DIAGNOSIS — K921 Melena: Secondary | ICD-10-CM | POA: Diagnosis not present

## 2021-12-19 HISTORY — PX: ENTEROSCOPY: SHX5533

## 2021-12-19 HISTORY — PX: ESOPHAGOGASTRODUODENOSCOPY (EGD) WITH PROPOFOL: SHX5813

## 2021-12-19 LAB — COMPREHENSIVE METABOLIC PANEL
ALT: 12 U/L (ref 0–44)
AST: 18 U/L (ref 15–41)
Albumin: 3.3 g/dL — ABNORMAL LOW (ref 3.5–5.0)
Alkaline Phosphatase: 23 U/L — ABNORMAL LOW (ref 38–126)
Anion gap: 4 — ABNORMAL LOW (ref 5–15)
BUN: 16 mg/dL (ref 8–23)
CO2: 25 mmol/L (ref 22–32)
Calcium: 8.3 mg/dL — ABNORMAL LOW (ref 8.9–10.3)
Chloride: 109 mmol/L (ref 98–111)
Creatinine, Ser: 0.75 mg/dL (ref 0.44–1.00)
GFR, Estimated: >60 mL/min (ref >60)
Glucose, Bld: 99 mg/dL (ref 70–99)
Potassium: 3.6 mmol/L (ref 3.5–5.1)
Sodium: 138 mmol/L (ref 135–145)
Total Bilirubin: 0.7 mg/dL (ref 0.3–1.2)
Total Protein: 5.5 g/dL — ABNORMAL LOW (ref 6.5–8.1)

## 2021-12-19 LAB — CBC
HCT: 24.4 % — ABNORMAL LOW (ref 36.0–46.0)
Hemoglobin: 7.6 g/dL — ABNORMAL LOW (ref 12.0–15.0)
MCH: 29.7 pg (ref 26.0–34.0)
MCHC: 31.1 g/dL (ref 30.0–36.0)
MCV: 95.3 fL (ref 80.0–100.0)
Platelets: 185 10*3/uL (ref 150–400)
RBC: 2.56 MIL/uL — ABNORMAL LOW (ref 3.87–5.11)
RDW: 16.3 % — ABNORMAL HIGH (ref 11.5–15.5)
WBC: 5.4 10*3/uL (ref 4.0–10.5)
nRBC: 0 % (ref 0.0–0.2)

## 2021-12-19 LAB — HEMOGLOBIN AND HEMATOCRIT, BLOOD
HCT: 24.7 % — ABNORMAL LOW (ref 36.0–46.0)
Hemoglobin: 7.9 g/dL — ABNORMAL LOW (ref 12.0–15.0)

## 2021-12-19 LAB — PREPARE RBC (CROSSMATCH)

## 2021-12-19 LAB — TROPONIN I (HIGH SENSITIVITY): Troponin I (High Sensitivity): 24 ng/L — ABNORMAL HIGH (ref <18)

## 2021-12-19 LAB — PHOSPHORUS: Phosphorus: 3.8 mg/dL (ref 2.5–4.6)

## 2021-12-19 LAB — MAGNESIUM: Magnesium: 2 mg/dL (ref 1.7–2.4)

## 2021-12-19 SURGERY — ESOPHAGOGASTRODUODENOSCOPY (EGD) WITH PROPOFOL
Anesthesia: General

## 2021-12-19 MED ORDER — PROPOFOL 10 MG/ML IV BOLUS
INTRAVENOUS | Status: AC
  Filled 2021-12-19: qty 20

## 2021-12-19 MED ORDER — PROPOFOL 10 MG/ML IV BOLUS
INTRAVENOUS | Status: DC | PRN
  Administered 2021-12-19: 80 mg via INTRAVENOUS

## 2021-12-19 MED ORDER — PROPOFOL 500 MG/50ML IV EMUL
INTRAVENOUS | Status: DC | PRN
  Administered 2021-12-19: 140 ug/kg/min via INTRAVENOUS

## 2021-12-19 MED ORDER — GLUCAGON HCL RDNA (DIAGNOSTIC) 1 MG IJ SOLR
INTRAMUSCULAR | Status: AC
  Filled 2021-12-19: qty 1

## 2021-12-19 MED ORDER — SODIUM CHLORIDE 0.9% IV SOLUTION: Freq: Once | INTRAVENOUS | Status: AC

## 2021-12-19 MED ORDER — LIDOCAINE HCL (PF) 2 % IJ SOLN
INTRAMUSCULAR | Status: AC
  Filled 2021-12-19: qty 5

## 2021-12-19 MED ORDER — GLUCAGON HCL RDNA (DIAGNOSTIC) 1 MG IJ SOLR
INTRAMUSCULAR | Status: DC | PRN
  Administered 2021-12-19: .5 mg via INTRAVENOUS

## 2021-12-19 MED ORDER — SODIUM CHLORIDE 0.9 % IV SOLN: INTRAVENOUS | Status: DC

## 2021-12-19 MED ORDER — LIDOCAINE HCL (CARDIAC) PF 100 MG/5ML IV SOSY
PREFILLED_SYRINGE | INTRAVENOUS | Status: DC | PRN
  Administered 2021-12-19: 100 mg via INTRAVENOUS

## 2021-12-19 MED ORDER — LACTATED RINGERS IV SOLN: INTRAVENOUS | Status: DC

## 2021-12-19 NOTE — Transfer of Care (Signed)
Immediate Anesthesia Transfer of Care Note  Patient: Stacey Reyes  Procedure(s) Performed: ESOPHAGOGASTRODUODENOSCOPY (EGD) WITH PROPOFOL ENTEROSCOPY  Patient Location: PACU  Anesthesia Type:General  Level of Consciousness: awake, alert  and oriented  Airway & Oxygen Therapy: Patient Spontanous Breathing  Post-op Assessment: Report given to RN, Post -op Vital signs reviewed and stable, Patient moving all extremities X 4 and Patient able to stick tongue midline  Post vital signs: Reviewed  Last Vitals:  Vitals Value Taken Time  BP 88/49 12/19/21 1428  Temp 97.7   Pulse 80 12/19/21 1429  Resp 15 12/19/21 1429  SpO2 97 % 12/19/21 1429  Vitals shown include unvalidated device data.  Last Pain:  Vitals:   12/19/21 1407  TempSrc:   PainSc: 0-No pain         Complications: No notable events documented.

## 2021-12-19 NOTE — Op Note (Signed)
[facility] Patient Name: Stacey Reyes Procedure Date: 12/19/2021 1:46 PM MRN: [id number] Date of Birth: 1945-08-15 Attending MD: Norvel Richards , MD CSN: [id number] Age: 76 Admit Type: Inpatient Procedure:                Upper GI endoscopy / enteroscopy Indications:              Melena Providers:                Norvel Richards, MD, Rosina Lowenstein, RN, Aram Candela Referring MD:              Medicines:                Propofol per Anesthesia Complications:            No immediate complications. Estimated Blood Loss:     Estimated blood loss: none. Procedure:                Pre-Anesthesia Assessment:                           - Prior to the procedure, a History and Physical                            was performed, and patient medications and                            allergies were reviewed. The patient's tolerance of                            previous anesthesia was also reviewed. The risks                            and benefits of the procedure and the sedation                            options and risks were discussed with the patient.                            All questions were answered, and informed consent                            was obtained. Prior Anticoagulants: The patient has                            taken no previous anticoagulant or antiplatelet                            agents. ASA Grade Assessment: III - A patient with                            severe systemic disease. After reviewing the risks  and benefits, the patient was deemed in                            satisfactory condition to undergo the procedure.                           After obtaining informed consent, the endoscope was                            passed under direct vision. Throughout the                            procedure, the patient's blood pressure, pulse, and                            oxygen saturations were  monitored continuously. The                            [id number]-HQ190L ([id number]) scope was introduced through                            the mouth, and advanced to the mid-jejunum. The                            upper GI endoscopy was accomplished without                            difficulty. The patient tolerated the procedure                            well. Scope In: 2:10:17 PM Scope Out: 2:23:55 PM Total Procedure Duration: 0 hours 13 minutes 38 seconds  Findings:      The examined esophagus was normal. Gastric cavity empty. Scattered 1 to       3 mm benign-appearing gastric polyps not manipulated. No ulcer or       infiltrating process. Pylorus patent.      Scope advanced through the first, second third, third, fourth portion of       the duodenum and on into the mid jejunum. Small bowel mucosa appeared       normal. No blood in this segment of the GI tract. No suspect lesion       found. All previously mentioned mucosal surfaces were again seen and       findings reconfirmed as the scope was withdrawn. Impression:               - Normal esophagus. Normal stomach (small gastric                            polyps) normal-appearing duodenum and jejunum to                            midportion.                           - I suspect intermittently bleeding [place] in  the setting of aspirin exposure. Moderate Sedation:      Moderate (conscious) sedation was personally administered by an       anesthesia professional. The following parameters were monitored: oxygen       saturation, heart rate, blood pressure, respiratory rate, EKG, adequacy       of pulmonary ventilation, and response to care. Recommendation:           - Patient has a contact number available for                            emergencies. The signs and symptoms of potential                            delayed complications were discussed with the                            patient. Return to normal  activities tomorrow.                            Written discharge instructions were provided to the                            patient.                           - Clear liquid diet.                           - Continue present medications.                           - Return patient to hospital ward for observation.                            Trend hemoglobin. If stable in the morning, advance                            diet. ABSOLUTE cessation of all NSAID use. Once                            daily PPI should suffice.. Procedure Code(s):        --- Professional ---                           [phone], Esophagogastroduodenoscopy, flexible,                            transoral; diagnostic, including collection of                            specimen(s) by brushing or washing, when performed                            (separate procedure) Diagnosis Code(s):        --- Professional ---  K92.1, Melena (includes Hematochezia) CPT copyright 2019 [employer]. All rights reserved. The codes documented in this report are preliminary and upon coder review may  be revised to meet current compliance requirements. Cristopher Estimable. Eryc Bodey, MD Norvel Richards, MD 12/19/2021 2:40:39 PM This report has been signed electronically. Number of Addenda: 0

## 2021-12-19 NOTE — Consult Note (Signed)
$'@LOGO'P$ @   Referring Provider: Dr. Aletta Edouard Primary Care Physician:  [name], DO Primary Gastroenterologist:  Dr. Abbey Chatters  Date of Admission: 12/18/21 Date of Consultation: 12/19/21  Reason for Consultation:  Acute on chronic anemia, heme positive stool  HPI:  Stacey Reyes is a 76 y.o. year old female with history of HTN, HLD, IDA in setting of chronic GI bleeding, known AVMs, multiple admissions requiring blood transfusions and multiple endoscopic procedures over past few years including multiple EGDs, colonoscopies, capsule studies, and 2 IR interventions in September 2022 for dieulafoy lesion.  She did well following IR interventions until the middle of 2023 when she developed recurrent melena, acute on chronic symptomatic anemia requiring admission and transfusion. EGD/enteroscopy and capsule study in early July with no suspect lesion identified. Last admitted on 7/15 with melena and symptomatic anemia. Repeat EGD 7/16 with actively bleeding distal duodenal mucosa consistent with dieulafoy s/p placement of 4 hemostatic clips, but 1 fell off. She required 2 units PRBCs during that admission. She follows with hematology for IV iron prn.   Patient return to the emergency room yesterday after having outpatient labs completed revealing a hemoglobin of 7.8, down from 11.2 on 11/22/2021.  Due to profound shortness of breath/symptomatic anemia, patient was advised to proceed to the emergency room.  ED course: Couple of soft blood pressures including 102/56, but overall hemodynamically stable. Hemoglobin 7.5 with normocytic indices, platelets normal.  BMP with glucose 120, calcium 8.6, otherwise unremarkable. Fecal occult blood positive. Troponin 18 >> 24 Chest x-ray showed stable cardiomegaly without evidence of acute or active cardiopulmonary disease. She received 1 unit PRBCs and was started on Protonix infusion. Case was discussed with GI on-call Dr. He recommended admitting,  n.p.o. at midnight, and our service would evaluate in the morning.   Consult:  Mild SOB started last week and has been progressive. Noticed muscles also becoming achy.  Couldn't hardly walk across the room without getting winded. Stool chronically dark on iron. No brbpr. Had lab done and showed Hgb was low and was told to come to the hospital.  Was constipated 1 day last week, then had diarrhea starting Friday. Didn't take anything for constipation. Just 1 BM per day. Runny, not completely watery. Last BM was yesterday morning, still a little runny, but not as bad. Had a little mild lower abdominal pain with this, but this has resolved. She did take 1 alka seltzer when she lad some mild lower abdominal discomfort last week. No other NSAIDs.Takes alka seltzer maybe once every few weeks if her stomach "feels funny".   No nausea, vomiting, reflux symptoms, or dysphagia.   Taking pantoprazole 40 mg once daily.   Overall, feels a little better today.   Had clear liquids tray at 9 am this morning. Had about 10-12 oz of liquid.   Had a little chest discomfort yesterday. This has resolved.   Past Medical History:  Diagnosis Date   Anemia    Blood transfusion without reported diagnosis    Cataract    removed bilateral   Chronic cystitis    Diverticulitis    Heart murmur    Hyperlipidemia    Hypertension    Personal history of colonic polyps-adenomas 01/07/2012   2009 - 2 diminutive adenomas (prior polyps also) 01/07/2012 - 2 diminutive adenomas      Past Surgical History:  Procedure Laterality Date   BREAST BIOPSY Right    No Scar seen    COLONOSCOPY  01/2020   Dr.  Pyrtle; Five 3 to 9 mm polyps in the ascending colon, Two 4 to 5 mm polyps in the transverse colon, diverticulosis, eryethema and petechia in left colon biopsied, external and internal hemorrhoids. Pathology with tubular adenomas. Left colon biopsy was benign. Recommended 3 year surveillance.   ENTEROSCOPY N/A 09/26/2021    Procedure: ENTEROSCOPY;  Surgeon: Gatha Mayer, MD;  Location: [place];  Service: Gastroenterology;  Laterality: N/A;   ESOPHAGOGASTRODUODENOSCOPY N/A 10/07/2021   Procedure: ESOPHAGOGASTRODUODENOSCOPY (EGD);  Surgeon: Daneil Dolin, MD;  [facility];  Service: Endoscopy;  Laterality: N/A;  EGD with enteroscopy  with pediatric colonoscope   ESOPHAGOGASTRODUODENOSCOPY (EGD) WITH PROPOFOL N/A 11/23/2019   Procedure: ESOPHAGOGASTRODUODENOSCOPY (EGD) WITH PROPOFOL;  Surgeon: Daneil Dolin, MD;  [facility];  Service: Endoscopy;  Laterality: N/A;   ESOPHAGOGASTRODUODENOSCOPY (EGD) WITH PROPOFOL N/A 10/20/2020   Procedure: ESOPHAGOGASTRODUODENOSCOPY (EGD) WITH PROPOFOL;  Surgeon: Eloise Harman, DO;  [facility];  Service: Endoscopy;  Laterality: N/A;   ESOPHAGOGASTRODUODENOSCOPY (EGD) WITH PROPOFOL N/A 12/05/2020   Procedure: ESOPHAGOGASTRODUODENOSCOPY (EGD) WITH PROPOFOL;  Surgeon: Rogene Houston, MD;  [facility];  Service: Endoscopy;  Laterality: N/A;  with enteroscopy   ESOPHAGOGASTRODUODENOSCOPY (EGD) WITH PROPOFOL N/A 12/11/2020   Procedure: ESOPHAGOGASTRODUODENOSCOPY (EGD) WITH PROPOFOL;  Surgeon: Eloise Harman, DO;  [facility];  Service: Endoscopy;  Laterality: N/A;   ESOPHAGOGASTRODUODENOSCOPY (EGD) WITH PROPOFOL N/A 09/17/2021   Procedure: ESOPHAGOGASTRODUODENOSCOPY (EGD) WITH PROPOFOL;  Surgeon: Eloise Harman, DO;  [facility];  Service: Endoscopy;  Laterality: N/A;   GIVENS CAPSULE STUDY N/A 11/23/2019   Procedure: GIVENS CAPSULE STUDY;  Surgeon: Daneil Dolin, MD;  [facility];  Service: Endoscopy;  Laterality: N/A;   GIVENS CAPSULE STUDY N/A 10/22/2020   Procedure: GIVENS CAPSULE STUDY;  Surgeon: Eloise Harman, DO;  [facility];  Service: Endoscopy;  Laterality: N/A;   GIVENS CAPSULE STUDY N/A 09/26/2021   Procedure: GIVENS CAPSULE STUDY;  Surgeon: Gatha Mayer, MD;  Location: [place];  Service: Gastroenterology;  Laterality: N/A;   HEMOSTASIS CLIP PLACEMENT  12/11/2020   Procedure: HEMOSTASIS CLIP PLACEMENT;  Surgeon: Eloise Harman, DO;  [facility];  Service: Endoscopy;;   IR ANGIOGRAM SELECTIVE EACH ADDITIONAL VESSEL  12/05/2020   IR ANGIOGRAM SELECTIVE EACH ADDITIONAL VESSEL  12/05/2020   IR ANGIOGRAM SELECTIVE EACH ADDITIONAL VESSEL  12/05/2020   IR ANGIOGRAM SELECTIVE EACH ADDITIONAL VESSEL  12/13/2020   IR ANGIOGRAM SELECTIVE EACH ADDITIONAL VESSEL  12/13/2020   IR ANGIOGRAM VISCERAL SELECTIVE  12/05/2020   IR ANGIOGRAM VISCERAL SELECTIVE  12/13/2020   IR EMBO ART  VEN HEMORR LYMPH EXTRAV  INC GUIDE ROADMAPPING  12/05/2020   IR EMBO ART  VEN HEMORR LYMPH EXTRAV  INC GUIDE ROADMAPPING  12/13/2020   IR RADIOLOGIST EVAL & MGMT  01/11/2021   IR US GUIDE VASC ACCESS LEFT  12/13/2020   IR US GUIDE VASC ACCESS RIGHT  12/05/2020   SUBMUCOSAL TATTOO INJECTION  09/26/2021   Procedure: SUBMUCOSAL TATTOO INJECTION;  Surgeon: Gatha Mayer, MD;  Location: [place];  Service: Gastroenterology;;    Prior to Admission medications   Medication Sig Start Date End Date Taking? Authorizing Provider  acetaminophen (TYLENOL) 325 MG tablet Take 325 mg by mouth every 6 (six) hours as needed.   Yes [provider]  Ascorbic Acid (VITAMIN C PO) Take 1 tablet by mouth daily.   Yes [provider]  bisoprolol (ZEBETA) 5 MG tablet Take 5 mg by mouth daily.   Yes [provider]  CALCIUM PO Take 1 tablet by mouth daily.   Yes [provider]  Cholecalciferol (VITAMIN D-3 PO) Take 1 capsule by mouth daily.   Yes [provider]  COPPER PO Take by mouth.   Yes [provider]  cyanocobalamin (CYANOCOBALAMIN) 500 MCG tablet Take 500 mcg by mouth daily.   Yes [provider]  ferrous sulfate 325 (65 FE) MG tablet Take 1 tablet (325 mg total) by mouth daily. 09/27/21 09/27/22 Yes Patrecia Pour, MD  levocetirizine (XYZAL) 5 MG tablet TAKE 1 TABLET (5 MG TOTAL) BY MOUTH AT BEDTIME AS NEEDED FOR ALLERGIES (DRAINAGE). 09/03/21  Yes Gottschalk, Leatrice Jewels M, DO  MAGNESIUM PO Take 1 tablet by mouth daily.   Yes [provider]  pantoprazole (PROTONIX) 40 MG tablet Take 1 tablet (40 mg total) by mouth 2 (two) times daily. Patient taking differently: Take 40 mg by mouth daily. 09/19/21  Yes Tat, Shanon Brow, MD  rosuvastatin (CRESTOR) 10 MG tablet Take 1 tablet (10 mg total) by mouth at bedtime. 06/11/21  Yes Gottschalk, Leatrice Jewels M, DO  traZODone (DESYREL) 50 MG tablet Take 1 tablet (50 mg total) by mouth at bedtime as needed for sleep. 04/02/21  Yes Gottschalk, Leatrice Jewels M, DO  VITAMIN E PO Take 1 tablet by mouth daily.   Yes [provider]    Current Facility-Administered Medications  Medication Dose Route Frequency Provider Last Rate Last Admin   acetaminophen (TYLENOL) tablet 650 mg  650 mg Oral Q6H PRN Adefeso, Oladapo, DO       Or   acetaminophen (TYLENOL) suppository 650 mg  650 mg Rectal Q6H PRN Adefeso, Oladapo, DO       bisoprolol (ZEBETA) tablet 5 mg  5 mg Oral Daily Adefeso, Oladapo, DO   5 mg at 12/19/21 4010   ferrous sulfate tablet 325 mg  325 mg Oral Daily Adefeso, Oladapo, DO   325 mg at 12/19/21 0834   [START ON 12/22/2021] pantoprazole (PROTONIX) injection 40 mg  40 mg Intravenous Q12H Adefeso, Oladapo, DO       pantoprozole (PROTONIX) 80 mg /NS 100 mL infusion  8 mg/hr Intravenous Continuous Adefeso, Oladapo, DO 10 mL/hr at 12/19/21 0855 8 mg/hr at 12/19/21 0855   rosuvastatin (CRESTOR) tablet 10 mg  10 mg Oral QHS Adefeso, Oladapo, DO       vitamin B-12 (CYANOCOBALAMIN) tablet 500 mcg  500 mcg Oral Daily Adefeso, Oladapo, DO   500 mcg at 12/19/21 2725    Allergies as of 12/18/2021 - Review Complete 12/18/2021  Allergen Reaction Noted   Sulfa antibiotics Rash 04/29/2019    Family History  Problem Relation Age of Onset   Colon cancer Mother 56       80's    Breast cancer Sister    Asthma Brother    Colon polyps Neg Hx    Esophageal cancer Neg Hx    Rectal cancer Neg Hx    Stomach cancer Neg Hx     Social History   Socioeconomic History   Marital status: Married    Spouse name: Not on file   Number of children: 1   Years of education: Not on file   Highest education level: Not on file  Occupational History   Occupation: retired  Tobacco Use   Smoking status: Former    Types: Cigarettes    Quit date: 12/23/1984    Years  since quitting: 37.0   Smokeless tobacco: Never  Vaping Use   Vaping Use: Never used  Substance and Sexual Activity   Alcohol use: Yes    Comment: wine occasionally   Drug use: No   Sexual activity: Not on file  Other Topics Concern   Not on file  Social History Narrative   Patient is married and retired and has 1 grown child   Social Determinants of Radio broadcast assistant Strain: Low Risk  (03/21/2021)   Overall Financial Resource Strain (CARDIA)    Difficulty of Paying Living Expenses: Not hard at all  Food Insecurity: No Food Insecurity (12/18/2021)   Hunger Vital Sign    Worried About Running Out of Food in the Last Year: Never true    Ran Out of Food in the Last Year: Never true  Transportation Needs: No Transportation Needs (12/18/2021)   PRAPARE - Hydrologist (Medical): No    Lack of Transportation (Non-Medical): No  Physical Activity: Sufficiently Active (03/21/2021)   Exercise Vital Sign    Days of Exercise per Week: 1 day    Minutes of Exercise per Session: 150+ min  Stress: No Stress Concern Present (03/21/2021)   [place]    Feeling of Stress : Only a little  Social Connections: Moderately Integrated (03/21/2021)   Social Connection and Isolation Panel [NHANES]    Frequency of Communication with Friends and Family: More than three times a week    Frequency of Social Gatherings with  Friends and Family: More than three times a week    Attends Religious Services: More than 4 times per year    Active Member of [employer] or Organizations: No    Attends Archivist Meetings: Never    Marital Status: Married  Human resources officer Violence: Not At Risk (12/18/2021)   Humiliation, Afraid, Rape, and Kick questionnaire    Fear of Current or Ex-Partner: No    Emotionally Abused: No    Physically Abused: No    Sexually Abused: No    Review of Systems: Gen: Denies fever, chills, cold or flulike symptoms, presyncope, syncope. CV: Denies chest pain, heart palpitations. Resp: Admits to mild SOB.  GI: See HPI  GU : Denies urinary burning, urinary frequency, urinary incontinence.  MS: Admits to intermittent muscle aches.   Derm: Denies rash. Psych: Denies depression, anxiety,confusion, or memory loss Heme: See HPI  Physical Exam: Vital signs in last 24 hours: Temp:  [97.7 F (36.5 C)-98.5 F (36.9 C)] 97.7 F (36.5 C) (09/27 0337) Pulse Rate:  [67-85] 67 (09/27 0337) Resp:  [15-20] 18 (09/27 0337) BP: (95-132)/(52-81) 128/54 (09/27 0337) SpO2:  [96 %-99 %] 96 % (09/27 0337) FiO2 (%):  [21 %] 21 % (09/26 2114) Weight:  [89.8 kg] 89.8 kg (09/26 1722) Last BM Date : 12/18/21 General:   Alert,  Well-developed, well-nourished, pleasant and cooperative in NAD Head:  Normocephalic and atraumatic. Eyes:  Sclera clear, no icterus.   Conjunctiva pink. Ears:  Normal auditory acuity. Nose:  No deformity, discharge,  or lesions. Lungs:  Clear throughout to auscultation.   No wheezes, crackles, or rhonchi. No acute distress. Heart:  Regular rate and rhythm. Systolic murmer appreciated.  Abdomen:  Soft, nontender and nondistended. No masses, hepatosplenomegaly or hernias noted. Normal bowel sounds, without guarding, and without rebound.   Rectal:  Deferred.  Msk:  Symmetrical without gross deformities. Normal posture. Extremities:  Without edema. Neurologic:  Alert and  oriented  x4;  grossly normal neurologically. Skin:  Intact without significant lesions or rashes. Psych: Normal mood and affect.  Intake/Output from previous day: 09/26 0701 - 09/27 0700 In: 796.4 [I.V.:166.4; Blood:630] Out: -  Intake/Output this shift: No intake/output data recorded.  Lab Results: Recent Labs    12/18/21 1259 12/18/21 1807 12/19/21 0702  WBC 6.5 6.5 5.4  HGB 7.8* 7.5* 7.6*  HCT 25.0* 23.9* 24.4*  PLT 239 223 185   BMET Recent Labs    12/18/21 1807 12/19/21 0702  NA 137 138  K 3.8 3.6  CL 104 109  CO2 25 25  GLUCOSE 120* 99  BUN 18 16  CREATININE 0.83 0.75  CALCIUM 8.6* 8.3*    Studies/Results: DG Chest Port 1 View  Result Date: 12/18/2021 CLINICAL DATA:  Shortness of breath. EXAM: PORTABLE CHEST 1 VIEW COMPARISON:  October 05, 2021 FINDINGS: The cardiac silhouette is mildly enlarged and unchanged in size. Both lungs are clear. Multilevel degenerative changes are seen throughout the thoracic spine. IMPRESSION: Stable cardiomegaly without evidence of acute or active cardiopulmonary disease. Electronically Signed   By: Virgina Norfolk M.D.   On: 12/18/2021 18:06    Impression: 76 y.o. year old female with history of HTN, HLD, IDA in setting of chronic GI bleeding, known AVMs, multiple admissions requiring blood transfusions and multiple endoscopic procedures over past few years including multiple EGDs, colonoscopies, capsule studies, and 2 IR interventions in September 2022 for dieulafoy lesion.  She did well following IR interventions until the middle of 2023 when she developed recurrent melena, acute on chronic symptomatic anemia requiring admission and transfusion. EGD/enteroscopy and capsule study in early July with no suspect lesion identified. Last admitted on 7/15 with melena and symptomatic anemia. Repeat EGD 7/16 with actively bleeding distal duodenal mucosa consistent with dieulafoy s/p placement of 4 hemostatic clips, but 1 fell off. She required 2 units  PRBCs during that admission. She follows with hematology for IV iron prn.    Patient return to the emergency room yesterday after having outpatient labs completed revealing a hemoglobin of 7.8, down from 11.2 on 11/22/2021, now admitted with symptomatic anemia.   Acute on chronic symptomatic anemia:  Hemoglobin 7.5 on admission, down from 11.2 on 11/22/2021.  Associated shortness of breath/fatigue.  Stools are chronically dark on oral iron, but patient notes increasing shortness of breath over the last week along with change in bowel habits to loose stools starting 5 days ago.  She did take 1 Alka-Seltzer last week when she had some mild lower abdominal discomfort.  Reports taking Alka-Seltzer every now and then, but nothing routine and no other NSAIDs.  Chronically on pantoprazole 40 mg daily.  No other significant GI symptoms.  She has received 1 unit PRBC since admission, but hemoglobin remained at 7.6 this morning. Clinically, she reports feeling somewhat improved.   Suspect patient is likely bleeding from small bowel source, specifically dieulafoy lesion considering acute drop in Hgb. With an appropriate improvement in hemoglobin after 1 unit PRBCs, suggest at least active bleeding up until yesterday and can't rule out ongoing bleeding at this time.  She has had no overt bleeding today. Last BM was yesterday morning.  Ideally, she needs EGD ASAP for further evaluation and hopes to catch the bleeding dieulafoy lesion. Unfortunately she had 10-12 oz of clear liquids this morning. Case discussed with Dr. Gala Romney. We will make her NPO and plan for procedure later this afternoon.   Plan: NPO Recheck H/H  at 11 am.  Proceed with upper endoscopy, possible enteroscopy with propofol by Dr. Gala Romney. The risks, benefits, and alternatives have been discussed with the patient in detail. The patient states understanding and desires to proceed.  Continue IV PPI infusion.     LOS: 0 days    12/19/2021, 10:23  AM   Aliene Altes, PA-C [facility] Gastroenter[facility]

## 2021-12-19 NOTE — Progress Notes (Signed)
Progress Note   Patient: Stacey Reyes TFT:[id number] DOB: Apr 12, 1945 DOA: 12/18/2021     0 DOS: the patient was seen and examined on 12/19/2021   Brief hospital course: As per H&P written by Dr. Josephine Cables on 12/18/2021 Stacey Reyes is a 76 y.o. female with medical history significant of hypertension, hyperlipidemia, chronic blood loss anemia with prior blood transfusions, diverticulitis who presents to the emergency department due to low hemoglobin.  Patient complaining of 1 week onset of increasing and worsening shortness of breath.  Shortness of breath worsens on exertion and is associated with muscle aches.  She went for routine blood work today and hemoglobin was noted to have dropped to 7.8, this was 11.2 on 11/22/2021, so she went to an urgent care where she was directed to go to the ED for further evaluation and management.  Patient is chronically on iron, so her stool was always black and was difficult for her to know if she has acute ongoing GI bleed.  She denies use of NSAIDs including Goody powder, she denies use of tobacco, alcohol or any other recreational drug use.   ED Course:  In the emergency department, she was hemodynamically stable.  Work-up in the ED showed H/H of 7.5/23.9, this was 7.8/25.01 blood culture was done earlier this morning.  BMP was normal except for blood glucose of 120, FOBT was positive, troponin x118, vitamin B12 341. Chest x-ray showed stable cardiomegaly without evidence of acute or active cardiopulmonary disease. IV Protonix 40 mg x 1 was given.  Gastroenterologist was consulted and recommended admitting patient with n.p.o. at midnight and will plan to see patient in the morning.  Hospitalist was asked to admit patient for further evaluation and management.  Assessment and Plan: Acute blood loss anemia secondary to GI bleed -Patient hemoglobin in August 2023 around 11.2; presenting with a hemoglobin of 7.5 at time of admission -Patient reporting some  melanotic changes in her stools (even she is chronically on iron) and also some hematochezia. -Patient reported shortness of breath and generalized weakness that has worsened in the last 2 weeks prior to admission. -1 unit PRBC transfused -Hemoglobin around 7.8 after transfusion -Planning to transfuse 1 more unit to try to achieve a hemoglobin above 8 and minimize the presence of symptoms from anemia. -Follow GI service recommendations and results from planned endoscopy later today. -Continue PPI.  Gastroesophageal reflux disease -Continue PPI  History of iron deficiency anemia -Continue ferrous sulfate.  Essential hypertension -Continue bisoprolol  Mixed hyperlipidemia -Continue Crestor  Class 1 obesity Body mass index is 31.96 kg/m. -Low calorie diet, portion control and increase physical activity discussed with patient.   Subjective:  Reports no chest pain or shortness of breath; is still feeling weak.  Patient is afebrile.  No overt bleeding reported.  Physical Exam: Vitals:   12/18/21 2230 12/18/21 2347 12/19/21 0337 12/19/21 1127  BP: (!) 131/58 132/73 (!) 128/54 (!) 172/69  Pulse: 74 73 67 64  Resp: '19 18 18 15  '$ Temp:  98.3 F (36.8 C) 97.7 F (36.5 C) 98.2 F (36.8 C)  TempSrc:  Oral Oral Oral  SpO2: 96% 98% 96% 97%  Weight:    89.8 kg  Height:    '5\' 6"'$  (1.676 m)   General exam: Alert, awake, oriented x 3; in no acute distress currently. Respiratory system: Clear to auscultation. Respiratory effort normal. Cardiovascular system:RRR. No murmurs, rubs, gallops. Gastrointestinal system: Abdomen is obese, nondistended, soft and nontender. No organomegaly or masses felt. Normal  bowel sounds heard. Central nervous system: Alert and oriented. No focal neurological deficits. Extremities: No cyanosis or clubbing. Skin: No petechiae. Psychiatry: Judgement and insight appear normal. Mood & affect appropriate.   Data Reviewed: CBC: White blood cells 5.4, hemoglobin  7.8, platelet count 185K -Complete metabolic panel: Sodium 620, potassium 3.6, chloride 109, bicarb 25, BUN 16, creatinine 0.75; normal LFTs.  Family Communication: No family at bedside  Disposition: Status is: Observation The patient remains OBS appropriate and will d/c before 2 midnights.   Planned Discharge Destination: Home  Author: Barton Dubois, MD 12/19/2021 1:05 PM  For on call review [email or web address].

## 2021-12-19 NOTE — Anesthesia Preprocedure Evaluation (Signed)
Anesthesia Evaluation  Patient identified by MRN, date of birth, ID band Patient awake    Reviewed: Allergy & Precautions, H&P , NPO status , Patient's Chart, lab work & pertinent test results, reviewed documented beta blocker date and time   Airway Mallampati: II  TM Distance: >3 FB Neck ROM: full    Dental no notable dental hx.    Pulmonary neg pulmonary ROS, former smoker,    Pulmonary exam normal breath sounds clear to auscultation       Cardiovascular Exercise Tolerance: Good hypertension, + Valvular Problems/Murmurs (murmur, asymptomatic) MR  Rhythm:regular Rate:Normal  IMPRESSIONS Left ventricular ejection fraction, by estimation, is 60 to 65%. The left ventricle has normal function. The left ventricle has no regional wall motion abnormalities. There is severe concentric left ventricular hypertrophy. Left ventricular diastolic parameters are consistent with Grade I diastolic dysfunction (impaired relaxation). 1. 2. Right ventricular systolic function is normal. The right ventricular size is normal. 3. Left atrial size was severely dilated. The mitral valve is normal in structure. Trivial mitral valve regurgitation. No evidence of mitral stenosis. 4. The aortic valve is normal in structure. Aortic valve regurgitation is not visualized. No aortic stenosis is present. 5. The inferior vena cava is normal in size with greater than 50% respiratory variability, suggesting right atrial pressure of 3 mmHg.   Neuro/Psych negative neurological ROS  negative psych ROS   GI/Hepatic negative GI ROS, Neg liver ROS,   Endo/Other  negative endocrine ROS  Renal/GU negative Renal ROS  negative genitourinary   Musculoskeletal negative musculoskeletal ROS (+)   Abdominal   Peds  Hematology negative hematology ROS (+) Blood dyscrasia, anemia ,   Anesthesia Other Findings   Reproductive/Obstetrics negative OB ROS                              Anesthesia Physical  Anesthesia Plan  ASA: 4 and emergent  Anesthesia Plan: General   Post-op Pain Management:    Induction:   PONV Risk Score and Plan: Propofol infusion and TIVA  Airway Management Planned: Nasal Cannula and Natural Airway  Additional Equipment:   Intra-op Plan:   Post-operative Plan:   Informed Consent: I have reviewed the patients History and Physical, chart, labs and discussed the procedure including the risks, benefits and alternatives for the proposed anesthesia with the patient or authorized representative who has indicated his/her understanding and acceptance.     Dental Advisory Given  Plan Discussed with: CRNA  Anesthesia Plan Comments:         Anesthesia Quick Evaluation

## 2021-12-19 NOTE — TOC Progression Note (Signed)
Transition of Care [facility]) - Progression Note    Patient Details  Name: ITALI MCKENDRY MRN: [id number] Date of Birth: 1946/01/17  Transition of Care [facility]) CM/SW Contact  Salome Arnt, [place] Phone Number: 12/19/2021, 10:49 AM  Clinical Narrative:   Transition of Care [facility] Screening Note   Patient Details  Name: ALEIRA DEITER Date of Birth: [birth year]   Transition of Care [facility]) CM/SW Contact:    Salome Arnt, [place] Phone Number: 12/19/2021, 10:49 AM    Transition of Care Department [facility]) has reviewed patient and no TOC needs have been identified at this time. We will continue to monitor patient advancement through interdisciplinary progression rounds. If new patient transition needs arise, please place a TOC consult.         Barriers to Discharge: Continued Medical Work up  Expected Discharge Plan and Services                                                 Social Determinants of Health (SDOH) Interventions    Readmission Risk Interventions    10/09/2021    1:14 PM  Readmission Risk Prevention Plan  Transportation Screening Complete  PCP or Specialist Appt within 3-5 Days Not Complete  [place] or [place] Complete  Social Work Consult for [place] Planning/Counseling Complete  Palliative Care Screening Not Applicable  Medication Review [employer]) Complete

## 2021-12-20 ENCOUNTER — Other Ambulatory Visit: Payer: Self-pay

## 2021-12-20 ENCOUNTER — Telehealth: Payer: Self-pay | Admitting: Gastroenterology

## 2021-12-20 DIAGNOSIS — I10 Essential (primary) hypertension: Secondary | ICD-10-CM | POA: Diagnosis not present

## 2021-12-20 DIAGNOSIS — D500 Iron deficiency anemia secondary to blood loss (chronic): Secondary | ICD-10-CM

## 2021-12-20 DIAGNOSIS — K219 Gastro-esophageal reflux disease without esophagitis: Secondary | ICD-10-CM | POA: Diagnosis not present

## 2021-12-20 DIAGNOSIS — D62 Acute posthemorrhagic anemia: Secondary | ICD-10-CM | POA: Diagnosis not present

## 2021-12-20 DIAGNOSIS — E782 Mixed hyperlipidemia: Secondary | ICD-10-CM | POA: Diagnosis not present

## 2021-12-20 DIAGNOSIS — T189XXA Foreign body of alimentary tract, part unspecified, initial encounter: Secondary | ICD-10-CM

## 2021-12-20 DIAGNOSIS — K922 Gastrointestinal hemorrhage, unspecified: Secondary | ICD-10-CM | POA: Diagnosis not present

## 2021-12-20 DIAGNOSIS — D649 Anemia, unspecified: Secondary | ICD-10-CM | POA: Diagnosis not present

## 2021-12-20 LAB — CBC
HCT: 27.5 % — ABNORMAL LOW (ref 36.0–46.0)
Hemoglobin: 9 g/dL — ABNORMAL LOW (ref 12.0–15.0)
MCH: 30.7 pg (ref 26.0–34.0)
MCHC: 32.7 g/dL (ref 30.0–36.0)
MCV: 93.9 fL (ref 80.0–100.0)
Platelets: 171 10*3/uL (ref 150–400)
RBC: 2.93 MIL/uL — ABNORMAL LOW (ref 3.87–5.11)
RDW: 16.7 % — ABNORMAL HIGH (ref 11.5–15.5)
WBC: 6.1 10*3/uL (ref 4.0–10.5)
nRBC: 0 % (ref 0.0–0.2)

## 2021-12-20 MED ORDER — PANTOPRAZOLE SODIUM 40 MG PO TBEC: 40.0000 mg | DELAYED_RELEASE_TABLET | Freq: Every day | ORAL | 1 refills | Status: DC

## 2021-12-20 NOTE — Discharge Summary (Signed)
Physician Discharge Summary   Patient: Stacey Reyes MRN: [id number] DOB: 1945-08-29  Admit date:     12/18/2021  Discharge date: 12/20/21  Discharge Physician: Barton Dubois   PCP: [name], DO   Recommendations at discharge:  Repeat CBC to follow hemoglobin trend/stability Repeat basic metabolic panel to follow electrolytes and renal function  Discharge Diagnoses: Principal Problem:   GI bleed Active Problems:   Hypertension, essential   Mixed hyperlipidemia   Iron deficiency anemia due to chronic blood loss   Symptomatic anemia   Obesity (BMI 30-39.9)   GERD (gastroesophageal reflux disease)  Brief Hospital Course: As per H&P written by Dr. Josephine Cables on 12/18/2021 Stacey Reyes is a 76 y.o. female with medical history significant of hypertension, hyperlipidemia, chronic blood loss anemia with prior blood transfusions, diverticulitis who presents to the emergency department due to low hemoglobin.  Patient complaining of 1 week onset of increasing and worsening shortness of breath.  Shortness of breath worsens on exertion and is associated with muscle aches.  She went for routine blood work today and hemoglobin was noted to have dropped to 7.8, this was 11.2 on 11/22/2021, so she went to an urgent care where she was directed to go to the ED for further evaluation and management.  Patient is chronically on iron, so her stool was always black and was difficult for her to know if she has acute ongoing GI bleed.  She denies use of NSAIDs including Goody powder, she denies use of tobacco, alcohol or any other recreational drug use.   ED Course:  In the emergency department, she was hemodynamically stable.  Work-up in the ED showed H/H of 7.5/23.9, this was 7.8/25.01 blood culture was done earlier this morning.  BMP was normal except for blood glucose of 120, FOBT was positive, troponin x118, vitamin B12 341. Chest x-ray showed stable cardiomegaly without evidence of acute or  active cardiopulmonary disease. IV Protonix 40 mg x 1 was given.  Gastroenterologist was consulted and recommended admitting patient with n.p.o. at midnight and will plan to see patient in the morning.  Hospitalist was asked to admit patient for further evaluation and management.  Assessment and Plan: Acute blood loss anemia secondary to GI bleed -Patient hemoglobin in August 2023 around 11.2; presenting with a hemoglobin of 7.5 at time of admission -Patient reporting some melanotic changes in her stools (even she is chronically on iron) and also some hematochezia. -Patient reported shortness of breath and generalized weakness that has worsened in the last 2 weeks prior to admission. -Status post 2 unit PRBCs transfused during hospitalization; hemoglobin 9.0 at the discharge. -Following GI service recommendations patient will be discharged home with daily PPI and swallowed avoidance of aspirin or any NSAIDs -Continue PPI once a day at time of discharge.. -Endoscopic evaluation demonstrating Dieulafoy lesion in her stomach.   Gastroesophageal reflux disease -Continue PPI -Lifestyle changes/modifications discussed with patient.   History of iron deficiency anemia -Continue ferrous sulfate, vitamin C and B12.. -Continue to follow ferritin level and hemoglobin trend as an outpatient.   Essential hypertension -Continue bisoprolol -Heart healthy diet discussed with patient.   Mixed hyperlipidemia -Continue Crestor -Heart healthy diet discussed with patient.   Class 1 obesity Body mass index is 31.96 kg/m. -Low calorie diet, portion control and increase physical activity discussed with patient.    Consultants: GI service Procedures performed: EGD: Disposition: Home Diet recommendation: Heart healthy diet.  DISCHARGE MEDICATION: Allergies as of 12/20/2021  Reactions   Sulfa Antibiotics Rash        Medication List     TAKE these medications    acetaminophen 325 MG  tablet Commonly known as: TYLENOL Take 325 mg by mouth every 6 (six) hours as needed.   bisoprolol 5 MG tablet Commonly known as: ZEBETA Take 5 mg by mouth daily.   CALCIUM PO Take 1 tablet by mouth daily.   COPPER PO Take by mouth.   cyanocobalamin 500 MCG tablet Commonly known as: VITAMIN B12 Take 500 mcg by mouth daily.   ferrous sulfate 325 (65 FE) MG tablet Take 1 tablet (325 mg total) by mouth daily.   levocetirizine 5 MG tablet Commonly known as: XYZAL TAKE 1 TABLET (5 MG TOTAL) BY MOUTH AT BEDTIME AS NEEDED FOR ALLERGIES (DRAINAGE).   MAGNESIUM PO Take 1 tablet by mouth daily.   pantoprazole 40 MG tablet Commonly known as: PROTONIX Take 1 tablet (40 mg total) by mouth daily.   rosuvastatin 10 MG tablet Commonly known as: CRESTOR Take 1 tablet (10 mg total) by mouth at bedtime.   traZODone 50 MG tablet Commonly known as: DESYREL Take 1 tablet (50 mg total) by mouth at bedtime as needed for sleep.   VITAMIN C PO Take 1 tablet by mouth daily.   VITAMIN D-3 PO Take 1 capsule by mouth daily.   VITAMIN E PO Take 1 tablet by mouth daily.        Follow-up Information     Ronnie Doss M, DO. Schedule an appointment as soon as possible for a visit in 2 week(s).   Specialty: Family Medicine Contact information: [place] [address] [phone]         Minus Breeding, MD .   Specialty: Cardiology Contact information: [address] [place] [address] [phone]                Discharge Exam: Danley Danker Weights   12/18/21 1722 12/19/21 1127  Weight: 89.8 kg 89.8 kg   General exam: Alert, awake, oriented x 3 Respiratory system: Clear to auscultation. Respiratory effort normal. Cardiovascular system:RRR. No murmurs, rubs, gallops. Gastrointestinal system: Abdomen is nondistended, soft and nontender. No organomegaly or masses felt. Normal bowel sounds heard. Central nervous system: Alert and oriented. No  focal neurological deficits. Extremities: No C/C/E, +pedal pulses Skin: No rashes, lesions or ulcers Psychiatry: Judgement and insight appear normal. Mood & affect appropriate.    Condition at discharge: Stable and improved.  The results of significant diagnostics from this hospitalization (including imaging, microbiology, ancillary and laboratory) are listed below for reference.   Imaging Studies: DG Chest Port 1 View  Result Date: 12/18/2021 CLINICAL DATA:  Shortness of breath. EXAM: PORTABLE CHEST 1 VIEW COMPARISON:  October 05, 2021 FINDINGS: The cardiac silhouette is mildly enlarged and unchanged in size. Both lungs are clear. Multilevel degenerative changes are seen throughout the thoracic spine. IMPRESSION: Stable cardiomegaly without evidence of acute or active cardiopulmonary disease. Electronically Signed   By: Virgina Norfolk M.D.   On: 12/18/2021 18:06    Microbiology: Results for orders placed or performed during the hospital encounter of 12/04/20  SARS CORONAVIRUS 2 (TAT 6-24 HRS) Nasopharyngeal Nasopharyngeal Swab     Status: None   Collection Time: 12/04/20  2:30 PM   Specimen: Nasopharyngeal Swab  Result Value Ref Range Status   SARS Coronavirus 2 NEGATIVE NEGATIVE Final    Comment: (NOTE) SARS-CoV-2 target nucleic acids are NOT DETECTED.  The SARS-CoV-2  RNA is generally detectable in upper and lower respiratory specimens during the acute phase of infection. Negative results do not preclude SARS-CoV-2 infection, do not rule out co-infections with other pathogens, and should not be used as the sole basis for treatment or other patient management decisions. Negative results must be combined with clinical observations, patient history, and epidemiological information. The expected result is Negative.  Fact Sheet for Patients: SugarRoll.be  Fact Sheet for Healthcare Providers: https://www.woods-mathews.com/  This test is not  yet approved or cleared by the [place] FDA and  has been authorized for detection and/or diagnosis of SARS-CoV-2 by FDA under an Emergency Use Authorization (EUA). This EUA will remain  in effect (meaning this test can be used) for the duration of the COVID-19 declaration under Se ction 564(b)(1) of the Act, 21 U.S.C. section 360bbb-3(b)(1), unless the authorization is terminated or revoked sooner.  Performed at [facility], [place] [address] [place], [place] [address]   MRSA Next Gen by PCR, Nasal     Status: None   Collection Time: 12/05/20  8:37 PM   Specimen: Nasal Mucosa; Nasal Swab  Result Value Ref Range Status   MRSA by PCR Next Gen NOT DETECTED NOT DETECTED Final    Comment: (NOTE) The GeneXpert MRSA Assay (FDA approved for NASAL specimens only), is one component of a comprehensive MRSA colonization surveillance program. It is not intended to diagnose MRSA infection nor to guide or monitor treatment for MRSA infections. Test performance is not FDA approved in patients less than 71 years old. Performed at [facility], [address], [place],  [address]     Labs: CBC: Recent Labs  Lab 12/18/21 1259 12/18/21 1807 12/19/21 0702 12/19/21 1543 12/20/21 0631  WBC 6.5 6.5 5.4  --  6.1  NEUTROABS 3.8 3.7  --   --   --   HGB 7.8* 7.5* 7.6* 7.9* 9.0*  HCT 25.0* 23.9* 24.4* 24.7* 27.5*  MCV 96.5 96.8 95.3  --  93.9  PLT 239 223 185  --  062   Basic Metabolic Panel: Recent Labs  Lab 12/18/21 1807 12/19/21 0702  NA 137 138  K 3.8 3.6  CL 104 109  CO2 25 25  GLUCOSE 120* 99  BUN 18 16  CREATININE 0.83 0.75  CALCIUM 8.6* 8.3*  MG  --  2.0  PHOS  --  3.8   Liver Function Tests: Recent Labs  Lab 12/19/21 0702  AST 18  ALT 12  ALKPHOS 23*  BILITOT 0.7  PROT 5.5*  ALBUMIN 3.3*   CBG: No results for input(s): "GLUCAP" in the last 168 hours.  Discharge time spent: greater than 30 minutes.  Signed: Barton Dubois, MD [facility] 12/20/2021

## 2021-12-20 NOTE — Telephone Encounter (Signed)
OV made °

## 2021-12-20 NOTE — Telephone Encounter (Signed)
Patient being discharged from the hospital. Please arrange for cbc in 2 weeks.   She will need hospital follow up in 4 weeks with [facility] or Dr. Abbey Chatters

## 2021-12-20 NOTE — Telephone Encounter (Signed)
Lab was ordered and will be mailed with instructions to the patient to have completed in two weeks.

## 2021-12-21 LAB — METHYLMALONIC ACID, SERUM: Methylmalonic Acid, Quantitative: 145 nmol/L (ref 0–378)

## 2021-12-21 NOTE — Anesthesia Postprocedure Evaluation (Signed)
Anesthesia Post Note  Patient: Stacey Reyes  Procedure(s) Performed: ESOPHAGOGASTRODUODENOSCOPY (EGD) WITH PROPOFOL ENTEROSCOPY  Patient location during evaluation: Phase II Anesthesia Type: General Level of consciousness: awake Pain management: pain level controlled Vital Signs Assessment: post-procedure vital signs reviewed and stable Respiratory status: spontaneous breathing and respiratory function stable Cardiovascular status: blood pressure returned to baseline and stable Postop Assessment: no headache and no apparent nausea or vomiting Anesthetic complications: no Comments: Late entry   No notable events documented.   Last Vitals:  Vitals:   12/20/21 0521 12/20/21 1358  BP: (!) 151/73 137/66  Pulse: 70 63  Resp: 16 18  Temp: 36.6 C 36.9 C  SpO2: 97% 96%    Last Pain:  Vitals:   12/20/21 1358  TempSrc: Oral  PainSc:                  Louann Sjogren

## 2021-12-22 LAB — BPAM RBC
Blood Product Expiration Date: 202310312359
Blood Product Expiration Date: 202310312359
Blood Product Expiration Date: 202310312359
ISSUE DATE / TIME: 202309261920
ISSUE DATE / TIME: 202309280153
Unit Type and Rh: 5100
Unit Type and Rh: 5100
Unit Type and Rh: 5100

## 2021-12-22 LAB — TYPE AND SCREEN
ABO/RH(D): O POS
Antibody Screen: NEGATIVE
Unit division: 0
Unit division: 0
Unit division: 0

## 2021-12-25 ENCOUNTER — Other Ambulatory Visit: Payer: Medicare Other

## 2021-12-25 DIAGNOSIS — K922 Gastrointestinal hemorrhage, unspecified: Secondary | ICD-10-CM | POA: Diagnosis not present

## 2021-12-25 DIAGNOSIS — D62 Acute posthemorrhagic anemia: Secondary | ICD-10-CM | POA: Diagnosis not present

## 2021-12-25 LAB — CBC
Hematocrit: 32.3 % — ABNORMAL LOW (ref 34.0–46.6)
Hemoglobin: 10.3 g/dL — ABNORMAL LOW (ref 11.1–15.9)
MCH: 29.6 pg (ref 26.6–33.0)
MCHC: 31.9 g/dL (ref 31.5–35.7)
MCV: 93 fL (ref 79–97)
Platelets: 291 10*3/uL (ref 150–450)
RBC: 3.48 x10E6/uL — ABNORMAL LOW (ref 3.77–5.28)
RDW: 13.9 % (ref 11.7–15.4)
WBC: 6.1 10*3/uL (ref 3.4–10.8)

## 2021-12-27 ENCOUNTER — Inpatient Hospital Stay (HOSPITAL_COMMUNITY): Payer: Medicare Other | Attending: Physician Assistant | Admitting: Physician Assistant

## 2021-12-27 ENCOUNTER — Encounter: Payer: Self-pay | Admitting: Physician Assistant

## 2021-12-27 VITALS — Wt 198.0 lb

## 2021-12-27 DIAGNOSIS — D500 Iron deficiency anemia secondary to blood loss (chronic): Secondary | ICD-10-CM | POA: Diagnosis not present

## 2021-12-27 DIAGNOSIS — E538 Deficiency of other specified B group vitamins: Secondary | ICD-10-CM

## 2021-12-27 NOTE — Progress Notes (Signed)
Virtual Visit via Telephone Note [facility]  I connected with Stacey Reyes  on 12/27/2021 at  3:32 PM by telephone and verified that I am speaking with the correct person using two identifiers.  Location: Patient: Home Provider: [facility]   I discussed the limitations, risks, security and privacy concerns of performing an evaluation and management service by telephone and the availability of in person appointments. I also discussed with the patient that there may be a patient responsible charge related to this service. The patient expressed understanding and agreed to proceed.  REASON FOR VISIT:  Follow-up for iron deficiency   CURRENT THERAPY: Intermittent IV iron infusions and blood transfusions  INTERVAL HISTORY: Stacey Reyes is contacted today for follow-up of her iron deficiency anemia.  She was last evaluated via telemedicine visit by Tarri Abernethy PA-C on 10/16/2021.  She was recently hospitalized from 12/18/2021 through 12/20/2021 due to acute blood loss anemia with extreme fatigue and dyspnea on exertion.  She was found to have Hgb 7.8, which had dropped from 11.2 in the course of 1 month.  FOBT was positive.  She received 2 units PRBC during hospitalization.  EGD (12/19/2021) showed Dieulafoy lesion in her stomach.  Hgb at the time of discharge was 9.0.  Patient reports that she feels much better after blood transfusions during her recent hospitalization.  She has chronically dark bowel movements from taking iron supplementation and denies any changes in the color or consistency of her bowel movements.  She has some mild fatigue but denies any dyspnea, chest pain, headaches, lightheadedness, or syncope.  She reports 75% energy and 100% appetite.    OBSERVATIONS/OBJECTIVE: Review of Systems  Constitutional:  Positive for malaise/fatigue. Negative for chills, diaphoresis, fever and weight loss.  Respiratory:  Negative for cough and  shortness of breath.   Cardiovascular:  Negative for chest pain and palpitations.  Gastrointestinal:  Positive for diarrhea. Negative for abdominal pain, blood in stool, melena, nausea and vomiting.  Neurological:  Negative for dizziness and headaches.     PHYSICAL EXAM (per limitations of virtual telephone visit): The patient is alert and oriented x 3, exhibiting adequate mentation, good mood, and ability to speak in full sentences and execute sound judgement.   ASSESSMENT & PLAN: 1.  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  No improvement despite taking iron tablet twice daily - Patient has had multiple hospitalizations within the past year for acute blood loss anemia, is followed closely by gastroenterology - She had two hospitalization in September 2022, and received a total of 8 units PRBC during these hospital stays.  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 - 3 hospitalizations in June/July 2023, total of 4 units PRBC during his hospital stays.   - [place] (12/18/2021 - 12/20/2021): Acute blood loss anemia with extreme fatigue and dyspnea on exertion.  She was found to have Hgb 7.8, which had dropped from 11.2 in the course of 1 month.  FOBT was positive.  She received 2 units PRBC during hospitalization.  EGD (12/19/2021) showed Dieulafoy lesion in her stomach.  Hgb at the time of discharge was 9.0. - Most recent IV Feraheme on 10/22/2021 and 10/29/2021 - Reports chronically dark bowel movements from iron supplementation - Iron panel (12/18/2021): Ferritin 19, iron saturation 41% -  Most recent CBC (12/25/2021): Hgb 10.3/MCV 93 - PLAN: IV Feraheme x2 - Recommend CBC every 2 weeks (patient reports that she has a standing orders at her PCP office and we will have her blood checked there every  other week) - Repeat labs and RTC in 2 months with phone visit  - Patient is aware of alarm symptoms that would prompt immediate medical attention.   2.  B12 deficiency - She is taking B12 tablet 1000 mcg daily - Most recent labs (12/18/2021): Vitamin B12 normal at 341 with normal MMA - PLAN: Continue B12 supplement.  We will recheck levels annually (next in September 2024)   FOLLOW UP INSTRUCTIONS: Feraheme x2 Labs and phone visit in 2 months    I discussed the assessment and treatment plan with the patient. The patient was provided an opportunity to ask questions and all were answered. The patient agreed with the plan and demonstrated an understanding of the instructions.   The patient was advised to call back or seek an in-person evaluation if the symptoms worsen or if the condition fails to improve as anticipated.  I provided 17 minutes of non-face-to-face time during this encounter.   Harriett Rush, PA-C 12/27/21 6:29 PM

## 2021-12-31 ENCOUNTER — Inpatient Hospital Stay: Payer: Medicare Other | Attending: Physician Assistant

## 2021-12-31 VITALS — BP 144/89 | HR 67 | Temp 97.9°F | Resp 18

## 2021-12-31 DIAGNOSIS — K921 Melena: Secondary | ICD-10-CM | POA: Insufficient documentation

## 2021-12-31 DIAGNOSIS — D500 Iron deficiency anemia secondary to blood loss (chronic): Secondary | ICD-10-CM | POA: Insufficient documentation

## 2021-12-31 DIAGNOSIS — D509 Iron deficiency anemia, unspecified: Secondary | ICD-10-CM

## 2021-12-31 MED ORDER — SODIUM CHLORIDE 0.9 % IV SOLN
510.0000 mg | Freq: Once | INTRAVENOUS | Status: AC
  Administered 2021-12-31: 510 mg via INTRAVENOUS
  Filled 2021-12-31: qty 510

## 2021-12-31 MED ORDER — SODIUM CHLORIDE 0.9 % IV SOLN: Freq: Once | INTRAVENOUS | Status: AC

## 2021-12-31 NOTE — Progress Notes (Signed)
Patient presents today for Feraheme infusion. Vital signs stable. Patient states she took Claritin prior to arrival. Patient declines Tylenol pre-medication and states she only takes Claritin. Pharmacy notified. Patient unable to wait the post iron 30 minute monitor time due to she has another appointment at 15:30 pm. Patient teaching performed and understanding verbalized.   Feraheme given today per MD orders. Tolerated infusion without adverse affects. Vital signs stable. No complaints at this time. Discharged from clinic ambulatory in stable condition. Alert and oriented x 3. F/U with [facility] as scheduled.

## 2021-12-31 NOTE — Patient Instructions (Signed)
[facility]  Discharge Instructions: Thank you for choosing [facility] to provide your oncology and hematology care.  If you have a lab appointment with the [facility], please come in thru the Main Entrance and check in at the main information desk.  Wear comfortable clothing and clothing appropriate for easy access to any Portacath or PICC line.   We strive to give you quality time with your provider. You may need to reschedule your appointment if you arrive late (15 or more minutes).  Arriving late affects you and other patients whose appointments are after yours.  Also, if you miss three or more appointments without notifying the office, you may be dismissed from the clinic at the provider's discretion.      For prescription refill requests, have your pharmacy contact our office and allow 72 hours for refills to be completed.    Today you received the following : Feraheme infusion. Ferumoxytol Injection What is this medication? FERUMOXYTOL (FER ue MOX i tol) treats low levels of iron in your body (iron deficiency anemia). Iron is a mineral that plays an important role in making red blood cells, which carry oxygen from your lungs to the rest of your body. This medicine may be used for other purposes; ask your health care provider or pharmacist if you have questions. COMMON BRAND NAME(S): Feraheme What should I tell my care team before I take this medication? They need to know if you have any of these conditions: Anemia not caused by low iron levels High levels of iron in the blood Magnetic resonance imaging (MRI) test scheduled An unusual or allergic reaction to iron, other medications, foods, dyes, or preservatives Pregnant or trying to get pregnant Breast-feeding How should I use this medication? This medication is for injection into a vein. It is given in a hospital or clinic setting. Talk to your care team the use of this medication in children.  Special care may be needed. Overdosage: If you think you have taken too much of this medicine contact a poison control center or emergency room at once. NOTE: This medicine is only for you. Do not share this medicine with others. What if I miss a dose? It is important not to miss your dose. Call your care team if you are unable to keep an appointment. What may interact with this medication? Other iron products This list may not describe all possible interactions. Give your health care provider a list of all the medicines, herbs, non-prescription drugs, or dietary supplements you use. Also tell them if you smoke, drink alcohol, or use illegal drugs. Some items may interact with your medicine. What should I watch for while using this medication? Visit your care team regularly. Tell your care team if your symptoms do not start to get better or if they get worse. You may need blood work done while you are taking this medication. You may need to follow a special diet. Talk to your care team. Foods that contain iron include: whole grains/cereals, dried fruits, beans, or peas, leafy green vegetables, and organ meats (liver, kidney). What side effects may I notice from receiving this medication? Side effects that you should report to your care team as soon as possible: Allergic reactions--skin rash, itching, hives, swelling of the face, lips, tongue, or throat Low blood pressure--dizziness, feeling faint or lightheaded, blurry vision Shortness of breath Side effects that usually do not require medical attention (report to your care team if they continue  or are bothersome): Flushing Headache Joint pain Muscle pain Nausea Pain, redness, or irritation at injection site This list may not describe all possible side effects. Call your doctor for medical advice about side effects. You may report side effects to FDA at 1-800-FDA-1088. Where should I keep my medication? This medication is given in a hospital  or clinic and will not be stored at home. NOTE: This sheet is a summary. It may not cover all possible information. If you have questions about this medicine, talk to your doctor, pharmacist, or health care provider.  2023 Elsevier/Gold Standard (2020-08-04 00:00:00)       To help prevent nausea and vomiting after your treatment, we encourage you to take your nausea medication as directed.  BELOW ARE SYMPTOMS THAT SHOULD BE REPORTED IMMEDIATELY: *FEVER GREATER THAN 100.4 F (38 C) OR HIGHER *CHILLS OR SWEATING *NAUSEA AND VOMITING THAT IS NOT CONTROLLED WITH YOUR NAUSEA MEDICATION *UNUSUAL SHORTNESS OF BREATH *UNUSUAL BRUISING OR BLEEDING *URINARY PROBLEMS (pain or burning when urinating, or frequent urination) *BOWEL PROBLEMS (unusual diarrhea, constipation, pain near the anus) TENDERNESS IN MOUTH AND THROAT WITH OR WITHOUT PRESENCE OF ULCERS (sore throat, sores in mouth, or a toothache) UNUSUAL RASH, SWELLING OR PAIN  UNUSUAL VAGINAL DISCHARGE OR ITCHING   Items with * indicate a potential emergency and should be followed up as soon as possible or go to the Emergency Department if any problems should occur.  Please show the CHEMOTHERAPY ALERT CARD or IMMUNOTHERAPY ALERT CARD at check-in to the Emergency Department and triage nurse.  Should you have questions after your visit or need to cancel or reschedule your appointment, please contact [facility] [phone]  and follow the prompts.  Office hours are 8:00 a.m. to 4:30 p.m. Monday - Friday. Please note that voicemails left after 4:00 p.m. may not be returned until the following business day.  We are closed weekends and major holidays. You have access to a nurse at all times for urgent questions. Please call the main number to the clinic [phone] and follow the prompts.  For any non-urgent questions, you may also contact your provider using MyChart. We now offer e-Visits for anyone 50 and older to request care  online for non-urgent symptoms. For details visit [email or web address].   Also download the MyChart app! Go to the app store, search "MyChart", open the app, select [facility], and log in with your MyChart username and password.  Masks are optional in the cancer centers. If you would like for your care team to wear a mask while they are taking care of you, please let them know. You may have one support person who is at least 77 years old accompany you for your appointments.

## 2022-01-02 ENCOUNTER — Encounter (HOSPITAL_COMMUNITY): Payer: Self-pay | Admitting: Internal Medicine

## 2022-01-03 DIAGNOSIS — T189XXA Foreign body of alimentary tract, part unspecified, initial encounter: Secondary | ICD-10-CM | POA: Diagnosis not present

## 2022-01-03 DIAGNOSIS — D500 Iron deficiency anemia secondary to blood loss (chronic): Secondary | ICD-10-CM | POA: Diagnosis not present

## 2022-01-03 LAB — CBC WITH DIFFERENTIAL/PLATELET
Absolute Monocytes: 581 cells/uL (ref 200–950)
Basophils Absolute: 91 cells/uL (ref 0–200)
Basophils Relative: 1.6 %
Eosinophils Absolute: 274 cells/uL (ref 15–500)
Eosinophils Relative: 4.8 %
HCT: 37.5 % (ref 35.0–45.0)
Hemoglobin: 12.2 g/dL (ref 11.7–15.5)
Lymphs Abs: 1317 cells/uL (ref 850–3900)
MCH: 29.4 pg (ref 27.0–33.0)
MCHC: 32.5 g/dL (ref 32.0–36.0)
MCV: 90.4 fL (ref 80.0–100.0)
MPV: 10.8 fL (ref 7.5–12.5)
Monocytes Relative: 10.2 %
Neutro Abs: 3437 cells/uL (ref 1500–7800)
Neutrophils Relative %: 60.3 %
Platelets: 261 10*3/uL (ref 140–400)
RBC: 4.15 10*6/uL (ref 3.80–5.10)
RDW: 13.2 % (ref 11.0–15.0)
Total Lymphocyte: 23.1 %
WBC: 5.7 10*3/uL (ref 3.8–10.8)

## 2022-01-08 ENCOUNTER — Inpatient Hospital Stay: Payer: Medicare Other

## 2022-01-08 VITALS — BP 141/74 | HR 67 | Temp 97.7°F | Resp 16

## 2022-01-08 DIAGNOSIS — D500 Iron deficiency anemia secondary to blood loss (chronic): Secondary | ICD-10-CM

## 2022-01-08 DIAGNOSIS — K921 Melena: Secondary | ICD-10-CM | POA: Diagnosis not present

## 2022-01-08 DIAGNOSIS — D509 Iron deficiency anemia, unspecified: Secondary | ICD-10-CM

## 2022-01-08 MED ORDER — SODIUM CHLORIDE 0.9 % IV SOLN
510.0000 mg | Freq: Once | INTRAVENOUS | Status: AC
  Administered 2022-01-08: 510 mg via INTRAVENOUS
  Filled 2022-01-08: qty 17

## 2022-01-08 MED ORDER — SODIUM CHLORIDE 0.9 % IV SOLN: Freq: Once | INTRAVENOUS | Status: AC

## 2022-01-08 NOTE — Progress Notes (Signed)
Feraheme infusion given per orders. Patient tolerated it well without problems. Vitals stable and discharged home from clinic ambulatory. Follow up as scheduled.  

## 2022-01-08 NOTE — Patient Instructions (Signed)
[facility]  Discharge Instructions: Thank you for choosing [facility] to provide your oncology and hematology care.  If you have a lab appointment with the Cancer Center, please come in thru the Main Entrance and check in at the main information desk.  Wear comfortable clothing and clothing appropriate for easy access to any Portacath or PICC line.   We strive to give you quality time with your provider. You may need to reschedule your appointment if you arrive late (15 or more minutes).  Arriving late affects you and other patients whose appointments are after yours.  Also, if you miss three or more appointments without notifying the office, you may be dismissed from the clinic at the provider's discretion.      For prescription refill requests, have your pharmacy contact our office and allow 72 hours for refills to be completed.    Today you received the following, feraheme infusion.    To help prevent nausea and vomiting after your treatment, we encourage you to take your nausea medication as directed.  BELOW ARE SYMPTOMS THAT SHOULD BE REPORTED IMMEDIATELY: *FEVER GREATER THAN 100.4 F (38 C) OR HIGHER *CHILLS OR SWEATING *NAUSEA AND VOMITING THAT IS NOT CONTROLLED WITH YOUR NAUSEA MEDICATION *UNUSUAL SHORTNESS OF BREATH *UNUSUAL BRUISING OR BLEEDING *URINARY PROBLEMS (pain or burning when urinating, or frequent urination) *BOWEL PROBLEMS (unusual diarrhea, constipation, pain near the anus) TENDERNESS IN MOUTH AND THROAT WITH OR WITHOUT PRESENCE OF ULCERS (sore throat, sores in mouth, or a toothache) UNUSUAL RASH, SWELLING OR PAIN  UNUSUAL VAGINAL DISCHARGE OR ITCHING   Items with * indicate a potential emergency and should be followed up as soon as possible or go to the Emergency Department if any problems should occur.  Please show the CHEMOTHERAPY ALERT CARD or IMMUNOTHERAPY ALERT CARD at check-in to the Emergency Department and triage  nurse.  Should you have questions after your visit or need to cancel or reschedule your appointment, please contact [facility] [phone]  and follow the prompts.  Office hours are 8:00 a.m. to 4:30 p.m. Monday - Friday. Please note that voicemails left after 4:00 p.m. may not be returned until the following business day.  We are closed weekends and major holidays. You have access to a nurse at all times for urgent questions. Please call the main number to the clinic [phone] and follow the prompts.  For any non-urgent questions, you may also contact your provider using MyChart. We now offer e-Visits for anyone 18 and older to request care online for non-urgent symptoms. For details visit [email or web address].   Also download the MyChart app! Go to the app store, search "MyChart", open the app, select [facility], and log in with your MyChart username and password.  Masks are optional in the cancer centers. If you would like for your care team to wear a mask while they are taking care of you, please let them know. You may have one support person who is at least 77 years old accompany you for your appointments.  

## 2022-01-08 NOTE — Progress Notes (Signed)
Patient presents today for Feraheme infusion. Patient took Tylenol 650 mg PO and Claritin 10 mg PO at 12:45 pm. Patient has no complaints of any side effects related to her last iron.

## 2022-01-17 ENCOUNTER — Ambulatory Visit: Payer: Medicare Other | Admitting: Internal Medicine

## 2022-01-28 ENCOUNTER — Other Ambulatory Visit: Payer: Self-pay | Admitting: Family Medicine

## 2022-01-28 ENCOUNTER — Other Ambulatory Visit: Payer: Medicare Other

## 2022-01-28 ENCOUNTER — Other Ambulatory Visit: Payer: Self-pay

## 2022-01-28 DIAGNOSIS — D500 Iron deficiency anemia secondary to blood loss (chronic): Secondary | ICD-10-CM

## 2022-01-28 DIAGNOSIS — E538 Deficiency of other specified B group vitamins: Secondary | ICD-10-CM

## 2022-01-28 MED ORDER — PANTOPRAZOLE SODIUM 40 MG PO TBEC: 40.0000 mg | DELAYED_RELEASE_TABLET | Freq: Two times a day (BID) | ORAL | 3 refills | Status: DC

## 2022-01-28 NOTE — Telephone Encounter (Signed)
  Prescription Request  01/28/2022  Is this a "Controlled Substance" medicine? NO  Have you seen your PCP in the last 2 weeks? NO  If YES, route message to pool  -  If NO, patient needs to be scheduled for appointment.  What is the name of the medication or equipment? Pantoprazole 40 mg  Have you contacted your pharmacy to request a refill? YES   Which pharmacy would you like this sent to? [facility]   Patient notified that their request is being sent to the clinical staff for review and that they should receive a response within 2 business days.

## 2022-01-28 NOTE — Telephone Encounter (Signed)
Pt aware refill sent to pharmacy 

## 2022-01-28 NOTE — Addendum Note (Signed)
Addended by: Memory Argue on: 01/28/2022 04:34 PM   Modules accepted: Orders

## 2022-01-29 LAB — CBC WITH DIFFERENTIAL/PLATELET
Basophils Absolute: 0.1 10*3/uL (ref 0.0–0.2)
Basos: 1 %
EOS (ABSOLUTE): 0.3 10*3/uL (ref 0.0–0.4)
Eos: 4 %
Hematocrit: 40.5 % (ref 34.0–46.6)
Hemoglobin: 13.4 g/dL (ref 11.1–15.9)
Immature Grans (Abs): 0 10*3/uL (ref 0.0–0.1)
Immature Granulocytes: 0 %
Lymphocytes Absolute: 1.5 10*3/uL (ref 0.7–3.1)
Lymphs: 26 %
MCH: 30 pg (ref 26.6–33.0)
MCHC: 33.1 g/dL (ref 31.5–35.7)
MCV: 91 fL (ref 79–97)
Monocytes Absolute: 0.6 10*3/uL (ref 0.1–0.9)
Monocytes: 11 %
Neutrophils Absolute: 3.3 10*3/uL (ref 1.4–7.0)
Neutrophils: 58 %
Platelets: 240 10*3/uL (ref 150–450)
RBC: 4.47 x10E6/uL (ref 3.77–5.28)
RDW: 13.9 % (ref 11.7–15.4)
WBC: 5.8 10*3/uL (ref 3.4–10.8)

## 2022-01-29 LAB — FERRITIN: Ferritin: 242 ng/mL — ABNORMAL HIGH (ref 15–150)

## 2022-01-30 LAB — IRON AND TIBC
Iron Saturation: 24 % (ref 15–55)
Iron: 64 ug/dL (ref 27–139)
Total Iron Binding Capacity: 269 ug/dL (ref 250–450)
UIBC: 205 ug/dL (ref 118–369)

## 2022-02-07 ENCOUNTER — Ambulatory Visit: Payer: Medicare Other | Admitting: Internal Medicine

## 2022-02-21 ENCOUNTER — Encounter: Payer: Self-pay | Admitting: Internal Medicine

## 2022-02-21 ENCOUNTER — Ambulatory Visit (INDEPENDENT_AMBULATORY_CARE_PROVIDER_SITE_OTHER): Payer: Medicare Other | Admitting: Internal Medicine

## 2022-02-21 VITALS — BP 155/82 | HR 66 | Temp 97.9°F | Ht 66.0 in | Wt 198.2 lb

## 2022-02-21 DIAGNOSIS — Z86010 Personal history of colonic polyps: Secondary | ICD-10-CM | POA: Diagnosis not present

## 2022-02-21 DIAGNOSIS — D500 Iron deficiency anemia secondary to blood loss (chronic): Secondary | ICD-10-CM

## 2022-02-21 DIAGNOSIS — K219 Gastro-esophageal reflux disease without esophagitis: Secondary | ICD-10-CM

## 2022-02-21 DIAGNOSIS — K31819 Angiodysplasia of stomach and duodenum without bleeding: Secondary | ICD-10-CM | POA: Diagnosis not present

## 2022-02-21 NOTE — Progress Notes (Signed)
Referring Provider: [name], DO Primary Care Physician:  [name], DO Primary GI:  Dr. Abbey Chatters  Chief Complaint  Patient presents with   Follow-up    Follow up from ED visit in Sept./ IDA    HPI:   Stacey Reyes is a 76 y.o. female who presents to clinic for follow up.  She has history of IDA in setting of chronic GI bleeding, known AVMs, multiple admissions requiring blood transfusions and multiple endoscopic procedures over past few years including multiple EGDs, colonoscopies, capsule studies, and 2 IR interventions in September 2022 for dieulafoy lesion.  She did well following IR interventions until the middle of 2023 when she developed recurrent melena, acute on chronic symptomatic anemia requiring admission and transfusion. EGD/enteroscopy and capsule study in early July with no suspect lesion identified. Last admitted on 7/15 with melena and symptomatic anemia. Repeat EGD 7/16 with actively bleeding distal duodenal mucosa consistent with dieulafoy s/p placement of 4 hemostatic clips, but 1 fell off. She required 2 units PRBCs during that admission with discharge hemoglobin 7.9.  Most recent upper endoscopy/enteroscopy 12/19/2021 unremarkable.   She follows with hematology and receives IV iron infusions as needed. Also on PO Iron.   Most recent labs 01/28/22 with hemoglobin 13.4 which is the highest it has been in a long time.   Today, patient states she feels great. Has a lot of energy. Notes dark stools related to iron. Recently went to [place] with family for a week.   Past Medical History:  Diagnosis Date   Anemia    Blood transfusion without reported diagnosis    Cataract    removed bilateral   Chronic cystitis    Diverticulitis    Heart murmur    Hyperlipidemia    Hypertension    Personal history of colonic polyps-adenomas 01/07/2012   2009 - 2 diminutive adenomas (prior polyps also) 01/07/2012 - 2 diminutive adenomas      Past Surgical  History:  Procedure Laterality Date   BREAST BIOPSY Right    No Scar seen    COLONOSCOPY  01/2020   Dr. Hilarie Fredrickson; Five 3 to 9 mm polyps in the ascending colon, Two 4 to 5 mm polyps in the transverse colon, diverticulosis, eryethema and petechia in left colon biopsied, external and internal hemorrhoids. Pathology with tubular adenomas. Left colon biopsy was benign. Recommended 3 year surveillance.   ENTEROSCOPY N/A 09/26/2021   Procedure: ENTEROSCOPY;  Surgeon: Gatha Mayer, MD;  Location: [facility];  Service: Gastroenterology;  Laterality: N/A;   ENTEROSCOPY N/A 12/19/2021   Procedure: ENTEROSCOPY;  Surgeon: Daneil Dolin, MD;  [facility];  Service: Endoscopy;  Laterality: N/A;   ESOPHAGOGASTRODUODENOSCOPY N/A 10/07/2021   Procedure: ESOPHAGOGASTRODUODENOSCOPY (EGD);  Surgeon: Daneil Dolin, MD;  [facility];  Service: Endoscopy;  Laterality: N/A;  EGD with enteroscopy  with pediatric colonoscope   ESOPHAGOGASTRODUODENOSCOPY (EGD) WITH PROPOFOL N/A 11/23/2019   Procedure: ESOPHAGOGASTRODUODENOSCOPY (EGD) WITH PROPOFOL;  Surgeon: Daneil Dolin, MD;  [facility];  Service: Endoscopy;  Laterality: N/A;   ESOPHAGOGASTRODUODENOSCOPY (EGD) WITH PROPOFOL N/A 10/20/2020   Procedure: ESOPHAGOGASTRODUODENOSCOPY (EGD) WITH PROPOFOL;  Surgeon: Eloise Harman, DO;  [facility];  Service: Endoscopy;  Laterality: N/A;   ESOPHAGOGASTRODUODENOSCOPY (EGD) WITH PROPOFOL N/A 12/05/2020   Procedure: ESOPHAGOGASTRODUODENOSCOPY (EGD) WITH PROPOFOL;  Surgeon: Rogene Houston, MD;  [facility];  Service: Endoscopy;  Laterality: N/A;  with enteroscopy   ESOPHAGOGASTRODUODENOSCOPY (EGD)  WITH PROPOFOL N/A 12/11/2020   Procedure: ESOPHAGOGASTRODUODENOSCOPY (EGD) WITH PROPOFOL;  Surgeon: Eloise Harman, DO;  [facility];  Service: Endoscopy;  Laterality: N/A;   ESOPHAGOGASTRODUODENOSCOPY (EGD) WITH PROPOFOL N/A 09/17/2021   Procedure:  ESOPHAGOGASTRODUODENOSCOPY (EGD) WITH PROPOFOL;  Surgeon: Eloise Harman, DO;  [facility];  Service: Endoscopy;  Laterality: N/A;   ESOPHAGOGASTRODUODENOSCOPY (EGD) WITH PROPOFOL N/A 12/19/2021   Procedure: ESOPHAGOGASTRODUODENOSCOPY (EGD) WITH PROPOFOL;  Surgeon: Daneil Dolin, MD;  [facility];  Service: Endoscopy;  Laterality: N/A;  with pediatric scope   GIVENS CAPSULE STUDY N/A 11/23/2019   Procedure: GIVENS CAPSULE STUDY;  Surgeon: Daneil Dolin, MD;  [facility];  Service: Endoscopy;  Laterality: N/A;   GIVENS CAPSULE STUDY N/A 10/22/2020   Procedure: GIVENS CAPSULE STUDY;  Surgeon: Eloise Harman, DO;  [facility];  Service: Endoscopy;  Laterality: N/A;   GIVENS CAPSULE STUDY N/A 09/26/2021   Procedure: GIVENS CAPSULE STUDY;  Surgeon: Gatha Mayer, MD;  Location: [facility];  Service: Gastroenterology;  Laterality: N/A;   HEMOSTASIS CLIP PLACEMENT  12/11/2020   Procedure: HEMOSTASIS CLIP PLACEMENT;  Surgeon: Eloise Harman, DO;  [facility];  Service: Endoscopy;;   IR ANGIOGRAM SELECTIVE EACH ADDITIONAL VESSEL  12/05/2020   IR ANGIOGRAM SELECTIVE EACH ADDITIONAL VESSEL  12/05/2020   IR ANGIOGRAM SELECTIVE EACH ADDITIONAL VESSEL  12/05/2020   IR ANGIOGRAM SELECTIVE EACH ADDITIONAL VESSEL  12/13/2020   IR ANGIOGRAM SELECTIVE EACH ADDITIONAL VESSEL  12/13/2020   IR ANGIOGRAM VISCERAL SELECTIVE  12/05/2020   IR ANGIOGRAM VISCERAL SELECTIVE  12/13/2020   IR EMBO ART  VEN HEMORR LYMPH EXTRAV  INC GUIDE ROADMAPPING  12/05/2020   IR EMBO ART  VEN HEMORR LYMPH EXTRAV  INC GUIDE ROADMAPPING  12/13/2020   IR RADIOLOGIST EVAL & MGMT  01/11/2021   IR US GUIDE VASC ACCESS LEFT  12/13/2020   IR US GUIDE VASC ACCESS RIGHT  12/05/2020   SUBMUCOSAL TATTOO INJECTION  09/26/2021   Procedure: SUBMUCOSAL TATTOO INJECTION;  Surgeon: Gatha Mayer, MD;  [facility] END[facility];  Service: Gastroenterology;;    Current Outpatient  Medications  Medication Sig Dispense Refill   acetaminophen (TYLENOL) 325 MG tablet Take 325 mg by mouth every 6 (six) hours as needed.     Ascorbic Acid (VITAMIN C PO) Take 1 tablet by mouth daily.     bisoprolol (ZEBETA) 5 MG tablet Take 5 mg by mouth daily.     CALCIUM PO Take 1 tablet by mouth daily.     Cholecalciferol (VITAMIN D-3 PO) Take 1 capsule by mouth daily.     COPPER PO Take by mouth.     cyanocobalamin (CYANOCOBALAMIN) 500 MCG tablet Take 500 mcg by mouth daily.     ferrous sulfate 325 (65 FE) MG tablet Take 1 tablet (325 mg total) by mouth daily. 30 tablet 3   levocetirizine (XYZAL) 5 MG tablet TAKE 1 TABLET (5 MG TOTAL) BY MOUTH AT BEDTIME AS NEEDED FOR ALLERGIES (DRAINAGE). 90 tablet 1   MAGNESIUM PO Take 1 tablet by mouth daily.     pantoprazole (PROTONIX) 40 MG tablet Take 1 tablet (40 mg total) by mouth 2 (two) times daily. 180 tablet 3   rosuvastatin (CRESTOR) 10 MG tablet Take 1 tablet (10 mg total) by mouth at bedtime. 90 tablet 3   traZODone (DESYREL) 50 MG tablet Take 1 tablet (50 mg total) by mouth at bedtime as needed for sleep. [place]  tablet 3   VITAMIN E PO Take 1 tablet by mouth daily.     No current facility-administered medications for this visit.    Allergies as of 02/21/2022 - Review Complete 02/21/2022  Allergen Reaction Noted   Sulfa antibiotics Rash 04/29/2019    Family History  Problem Relation Age of Onset   Colon cancer Mother 57       80's   Breast cancer Sister    Asthma Brother    Colon polyps Neg Hx    Esophageal cancer Neg Hx    Rectal cancer Neg Hx    Stomach cancer Neg Hx     Social History   Socioeconomic History   Marital status: Married    Spouse name: Not on file   Number of children: 1   Years of education: Not on file   Highest education level: Not on file  Occupational History   Occupation: retired  Tobacco Use   Smoking status: Former    Types: Cigarettes    Quit date: 12/23/1984    Years since quitting: 37.1    Smokeless tobacco: Never  Vaping Use   Vaping Use: Never used  Substance and Sexual Activity   Alcohol use: Yes    Comment: wine occasionally   Drug use: No   Sexual activity: Not on file  Other Topics Concern   Not on file  Social History Narrative   Patient is married and retired and has 1 grown child   Social Determinants of Radio broadcast assistant Strain: Low Risk  (03/21/2021)   Overall Financial Resource Strain (CARDIA)    Difficulty of Paying Living Expenses: Not hard at all  Food Insecurity: No Food Insecurity (12/18/2021)   Hunger Vital Sign    Worried About Running Out of Food in the Last Year: Never true    Ran Out of Food in the Last Year: Never true  Transportation Needs: No Transportation Needs (12/18/2021)   PRAPARE - Hydrologist (Medical): No    Lack of Transportation (Non-Medical): No  Physical Activity: Sufficiently Active (03/21/2021)   Exercise Vital Sign    Days of Exercise per Week: 1 day    Minutes of Exercise per Session: 150+ min  Stress: No Stress Concern Present (03/21/2021)   [place]    Feeling of Stress : Only a little  Social Connections: Moderately Integrated (03/21/2021)   Social Connection and Isolation Panel [NHANES]    Frequency of Communication with Friends and Family: More than three times a week    Frequency of Social Gatherings with Friends and Family: More than three times a week    Attends Religious Services: More than 4 times per year    Active Member of [employer] or Organizations: No    Attends Archivist Meetings: Never    Marital Status: Married    Subjective: Review of Systems  Constitutional:  Negative for chills and fever.  HENT:  Negative for congestion and hearing loss.   Eyes:  Negative for blurred vision and double vision.  Respiratory:  Negative for cough and shortness of breath.   Cardiovascular:  Negative for  chest pain and palpitations.  Gastrointestinal:  Negative for abdominal pain, blood in stool, constipation, diarrhea, heartburn, melena and vomiting.  Genitourinary:  Negative for dysuria and urgency.  Musculoskeletal:  Negative for joint pain and myalgias.  Skin:  Negative for itching and rash.  Neurological:  Negative for  dizziness and headaches.  Psychiatric/Behavioral:  Negative for depression. The patient is not nervous/anxious.      Objective: BP (!) 155/82   Pulse 66   Temp 97.9 F (36.6 C)   Ht '5\' 6"'$  (1.676 m)   Wt 198 lb 3.2 oz (89.9 kg)   BMI 31.99 kg/m  Physical Exam Constitutional:      Appearance: Normal appearance.  HENT:     Head: Normocephalic and atraumatic.  Eyes:     Extraocular Movements: Extraocular movements intact.     Conjunctiva/sclera: Conjunctivae normal.  Cardiovascular:     Rate and Rhythm: Normal rate and regular rhythm.     Heart sounds: Murmur heard.  Pulmonary:     Effort: Pulmonary effort is normal.     Breath sounds: Normal breath sounds.  Abdominal:     General: Bowel sounds are normal.     Palpations: Abdomen is soft.  Musculoskeletal:        General: No swelling. Normal range of motion.     Cervical back: Normal range of motion and neck supple.  Skin:    General: Skin is warm and dry.     Coloration: Skin is not jaundiced.  Neurological:     General: No focal deficit present.     Mental Status: She is alert and oriented to person, place, and time.  Psychiatric:        Mood and Affect: Mood normal.        Behavior: Behavior normal.      Assessment: *Iron deficiency anemia-chronic *Small bowel AVMs, previous Dieulafoy lesion *Adenomatous colon polyps *Chronic GERD-well-controlled on pantoprazole twice daily  Plan: Patient doing very well.  Hemoglobin excellent.  Continue to follow with hematology.  Continue oral iron.  Colonoscopy for adenomatous colon polyps due next year.  Will discuss on follow-up visit.  GERD  well-controlled on pantoprazole.  Counseled patient she can try once a day and see how she does.  Follow-up with GI in 6 months  02/21/2022 9:33 AM   Disclaimer: This note was dictated with voice recognition software. Similar sounding words can inadvertently be transcribed and may not be corrected upon review.

## 2022-02-21 NOTE — Patient Instructions (Addendum)
I am happy to hear that you are doing well.  Your most recent hemoglobin is fantastic.  Continue on oral iron.  Continue IV iron as needed.  Continue follow-up with hematology.  Continue on pantoprazole.  You can trial once daily and see how you do.  You will be due for colonoscopy in a year.  Will discuss on follow-up visit.  Follow-up with GI in 6 months.  It was very nice seeing you again today.  Dr. Abbey Chatters

## 2022-02-22 ENCOUNTER — Other Ambulatory Visit: Payer: Medicare Other

## 2022-02-22 DIAGNOSIS — K922 Gastrointestinal hemorrhage, unspecified: Secondary | ICD-10-CM

## 2022-02-22 DIAGNOSIS — D62 Acute posthemorrhagic anemia: Secondary | ICD-10-CM | POA: Diagnosis not present

## 2022-02-22 LAB — CBC
Hematocrit: 41.5 % (ref 34.0–46.6)
Hemoglobin: 14.1 g/dL (ref 11.1–15.9)
MCH: 30.3 pg (ref 26.6–33.0)
MCHC: 34 g/dL (ref 31.5–35.7)
MCV: 89 fL (ref 79–97)
Platelets: 217 10*3/uL (ref 150–450)
RBC: 4.66 x10E6/uL (ref 3.77–5.28)
RDW: 15.1 % (ref 11.7–15.4)
WBC: 7 10*3/uL (ref 3.4–10.8)

## 2022-02-26 ENCOUNTER — Telehealth: Payer: Self-pay | Admitting: Family Medicine

## 2022-02-26 ENCOUNTER — Other Ambulatory Visit: Payer: Self-pay | Admitting: Family Medicine

## 2022-03-11 ENCOUNTER — Other Ambulatory Visit: Payer: Self-pay

## 2022-03-11 ENCOUNTER — Other Ambulatory Visit: Payer: Medicare Other

## 2022-03-11 DIAGNOSIS — D500 Iron deficiency anemia secondary to blood loss (chronic): Secondary | ICD-10-CM

## 2022-03-11 LAB — HEMOGLOBIN, FINGERSTICK: Hemoglobin: 14.5 g/dL (ref 11.1–15.9)

## 2022-03-17 ENCOUNTER — Other Ambulatory Visit: Payer: Self-pay | Admitting: Family Medicine

## 2022-03-21 ENCOUNTER — Other Ambulatory Visit: Payer: Self-pay

## 2022-03-21 DIAGNOSIS — D500 Iron deficiency anemia secondary to blood loss (chronic): Secondary | ICD-10-CM

## 2022-03-22 ENCOUNTER — Inpatient Hospital Stay: Payer: Medicare Other | Attending: Physician Assistant

## 2022-03-22 DIAGNOSIS — D500 Iron deficiency anemia secondary to blood loss (chronic): Secondary | ICD-10-CM | POA: Diagnosis not present

## 2022-03-22 LAB — IRON AND TIBC
Iron: 89 ug/dL (ref 28–170)
Saturation Ratios: 25 % (ref 10.4–31.8)
TIBC: 363 ug/dL (ref 250–450)
UIBC: 274 ug/dL

## 2022-03-22 LAB — CBC WITH DIFFERENTIAL/PLATELET
Abs Immature Granulocytes: 0.02 10*3/uL (ref 0.00–0.07)
Basophils Absolute: 0.1 10*3/uL (ref 0.0–0.1)
Basophils Relative: 1 %
Eosinophils Absolute: 0.2 10*3/uL (ref 0.0–0.5)
Eosinophils Relative: 3 %
HCT: 41.6 % (ref 36.0–46.0)
Hemoglobin: 14.1 g/dL (ref 12.0–15.0)
Immature Granulocytes: 0 %
Lymphocytes Relative: 23 %
Lymphs Abs: 1.7 10*3/uL (ref 0.7–4.0)
MCH: 30.1 pg (ref 26.0–34.0)
MCHC: 33.9 g/dL (ref 30.0–36.0)
MCV: 88.7 fL (ref 80.0–100.0)
Monocytes Absolute: 0.7 10*3/uL (ref 0.1–1.0)
Monocytes Relative: 10 %
Neutro Abs: 4.6 10*3/uL (ref 1.7–7.7)
Neutrophils Relative %: 63 %
Platelets: 239 10*3/uL (ref 150–400)
RBC: 4.69 MIL/uL (ref 3.87–5.11)
RDW: 13.9 % (ref 11.5–15.5)
WBC: 7.3 10*3/uL (ref 4.0–10.5)
nRBC: 0 % (ref 0.0–0.2)

## 2022-03-22 LAB — FERRITIN: Ferritin: 51 ng/mL (ref 11–307)

## 2022-03-26 ENCOUNTER — Encounter: Payer: Self-pay | Admitting: Hematology

## 2022-03-28 ENCOUNTER — Ambulatory Visit (INDEPENDENT_AMBULATORY_CARE_PROVIDER_SITE_OTHER): Payer: Medicare Other

## 2022-03-28 ENCOUNTER — Encounter: Payer: Self-pay | Admitting: Family Medicine

## 2022-03-28 VITALS — Ht 66.0 in | Wt 197.0 lb

## 2022-03-28 DIAGNOSIS — Z0000 Encounter for general adult medical examination without abnormal findings: Secondary | ICD-10-CM

## 2022-03-28 NOTE — Patient Instructions (Signed)
Stacey Reyes , Thank you for taking time to come for your Medicare Wellness Visit. I appreciate your ongoing commitment to your health goals. Please review the following plan we discussed and let me know if I can assist you in the future.   These are the goals we discussed:  Goals      Cut out extra servings     DIET - INCREASE WATER INTAKE     Weight < 180 lb (81.647 kg)     Not on track 2022        This is a list of the screening recommended for you and due dates:  Health Maintenance  Topic Date Due   COVID-19 Vaccine (5 - 2023-24 season) 11/23/2021   DTaP/Tdap/Td vaccine (2 - Td or Tdap) 01/28/2022   Flu Shot  06/23/2022*   Colon Cancer Screening  01/25/2023   Medicare Annual Wellness Visit  03/29/2023   DEXA scan (bone density measurement)  06/13/2023   Pneumonia Vaccine  Completed   Hepatitis C Screening: USPSTF Recommendation to screen - Ages 37-79 yo.  Completed   Zoster (Shingles) Vaccine  Completed   HPV Vaccine  Aged Out  *Topic was postponed. The date shown is not the original due date.    Advanced directives: Advance directive discussed with you today. I have provided a copy for you to complete at home and have notarized. Once this is complete please bring a copy in to our office so we can scan it into your chart.   Conditions/risks identified: Aim for 30 minutes of exercise or brisk walking, 6-8 glasses of water, and 5 servings of fruits and vegetables each day.   Next appointment: Follow up in one year for your annual wellness visit    Preventive Care 65 Years and Older, Female Preventive care refers to lifestyle choices and visits with your health care provider that can promote health and wellness. What does preventive care include? A yearly physical exam. This is also called an annual well check. Dental exams once or twice a year. Routine eye exams. Ask your health care provider how often you should have your eyes checked. Personal lifestyle choices,  including: Daily care of your teeth and gums. Regular physical activity. Eating a healthy diet. Avoiding tobacco and drug use. Limiting alcohol use. Practicing safe sex. Taking low-dose aspirin every day. Taking vitamin and mineral supplements as recommended by your health care provider. What happens during an annual well check? The services and screenings done by your health care provider during your annual well check will depend on your age, overall health, lifestyle risk factors, and family history of disease. Counseling  Your health care provider may ask you questions about your: Alcohol use. Tobacco use. Drug use. Emotional well-being. Home and relationship well-being. Sexual activity. Eating habits. History of falls. Memory and ability to understand (cognition). Work and work Statistician. Reproductive health. Screening  You may have the following tests or measurements: Height, weight, and BMI. Blood pressure. Lipid and cholesterol levels. These may be checked every 5 years, or more frequently if you are over 36 years old. Skin check. Lung cancer screening. You may have this screening every year starting at age 2 if you have a 30-pack-year history of smoking and currently smoke or have quit within the past 15 years. Fecal occult blood test (FOBT) of the stool. You may have this test every year starting at age 76. Flexible sigmoidoscopy or colonoscopy. You may have a sigmoidoscopy every 5 years or a colonoscopy  every 10 years starting at age 65. Hepatitis C blood test. Hepatitis B blood test. Sexually transmitted disease (STD) testing. Diabetes screening. This is done by checking your blood sugar (glucose) after you have not eaten for a while (fasting). You may have this done every 1-3 years. Bone density scan. This is done to screen for osteoporosis. You may have this done starting at age 66. Mammogram. This may be done every 1-2 years. Talk to your health care provider  about how often you should have regular mammograms. Talk with your health care provider about your test results, treatment options, and if necessary, the need for more tests. Vaccines  Your health care provider may recommend certain vaccines, such as: Influenza vaccine. This is recommended every year. Tetanus, diphtheria, and acellular pertussis (Tdap, Td) vaccine. You may need a Td booster every 10 years. Zoster vaccine. You may need this after age 69. Pneumococcal 13-valent conjugate (PCV13) vaccine. One dose is recommended after age 85. Pneumococcal polysaccharide (PPSV23) vaccine. One dose is recommended after age 28. Talk to your health care provider about which screenings and vaccines you need and how often you need them. This information is not intended to replace advice given to you by your health care provider. Make sure you discuss any questions you have with your health care provider. Document Released: 04/07/2015 Document Revised: 11/29/2015 Document Reviewed: 01/10/2015 Elsevier Interactive Patient Education  2017 [employer] Prevention in the Home Falls can cause injuries. They can happen to people of all ages. There are many things you can do to make your home safe and to help prevent falls. What can I do on the outside of my home? Regularly fix the edges of walkways and driveways and fix any cracks. Remove anything that might make you trip as you walk through a door, such as a raised step or threshold. Trim any bushes or trees on the path to your home. Use bright outdoor lighting. Clear any walking paths of anything that might make someone trip, such as rocks or tools. Regularly check to see if handrails are loose or broken. Make sure that both sides of any steps have handrails. Any raised decks and porches should have guardrails on the edges. Have any leaves, snow, or ice cleared regularly. Use sand or salt on walking paths during winter. Clean up any spills in  your garage right away. This includes oil or grease spills. What can I do in the bathroom? Use night lights. Install grab bars by the toilet and in the tub and shower. Do not use towel bars as grab bars. Use non-skid mats or decals in the tub or shower. If you need to sit down in the shower, use a plastic, non-slip stool. Keep the floor dry. Clean up any water that spills on the floor as soon as it happens. Remove soap buildup in the tub or shower regularly. Attach bath mats securely with double-sided non-slip rug tape. Do not have throw rugs and other things on the floor that can make you trip. What can I do in the bedroom? Use night lights. Make sure that you have a light by your bed that is easy to reach. Do not use any sheets or blankets that are too big for your bed. They should not hang down onto the floor. Have a firm chair that has side arms. You can use this for support while you get dressed. Do not have throw rugs and other things on the floor that can make  you trip. What can I do in the kitchen? Clean up any spills right away. Avoid walking on wet floors. Keep items that you use a lot in easy-to-reach places. If you need to reach something above you, use a strong step stool that has a grab bar. Keep electrical cords out of the way. Do not use floor polish or wax that makes floors slippery. If you must use wax, use non-skid floor wax. Do not have throw rugs and other things on the floor that can make you trip. What can I do with my stairs? Do not leave any items on the stairs. Make sure that there are handrails on both sides of the stairs and use them. Fix handrails that are broken or loose. Make sure that handrails are as long as the stairways. Check any carpeting to make sure that it is firmly attached to the stairs. Fix any carpet that is loose or worn. Avoid having throw rugs at the top or bottom of the stairs. If you do have throw rugs, attach them to the floor with carpet  tape. Make sure that you have a light switch at the top of the stairs and the bottom of the stairs. If you do not have them, ask someone to add them for you. What else can I do to help prevent falls? Wear shoes that: Do not have high heels. Have rubber bottoms. Are comfortable and fit you well. Are closed at the toe. Do not wear sandals. If you use a stepladder: Make sure that it is fully opened. Do not climb a closed stepladder. Make sure that both sides of the stepladder are locked into place. Ask someone to hold it for you, if possible. Clearly mark and make sure that you can see: Any grab bars or handrails. First and last steps. Where the edge of each step is. Use tools that help you move around (mobility aids) if they are needed. These include: Canes. Walkers. Scooters. Crutches. Turn on the lights when you go into a dark area. Replace any light bulbs as soon as they burn out. Set up your furniture so you have a clear path. Avoid moving your furniture around. If any of your floors are uneven, fix them. If there are any pets around you, be aware of where they are. Review your medicines with your doctor. Some medicines can make you feel dizzy. This can increase your chance of falling. Ask your doctor what other things that you can do to help prevent falls. This information is not intended to replace advice given to you by your health care provider. Make sure you discuss any questions you have with your health care provider. Document Released: 01/05/2009 Document Revised: 08/17/2015 Document Reviewed: 04/15/2014 Elsevier Interactive Patient Education  2017 [employer].

## 2022-03-28 NOTE — Progress Notes (Signed)
Subjective:   Stacey Reyes is a 77 y.o. female who presents for Medicare Annual (Subsequent) preventive examination. I connected with  [name] on 03/28/22 by a audio enabled telemedicine application and verified that I am speaking with the correct person using two identifiers.  Patient Location: Home  Provider Location: Home Office  I discussed the limitations of evaluation and management by telemedicine. The patient expressed understanding and agreed to proceed.  Review of Systems     Cardiac Risk Factors include: advanced age (>68mn, >>75women);hypertension     Objective:    Today's Vitals   03/28/22 0818  Weight: 197 lb (89.4 kg)  Height: '5\' 6"'$  (1.676 m)   Body mass index is 31.8 kg/m.     03/28/2022    8:22 AM 12/31/2021    2:00 PM 12/19/2021   11:26 AM 12/18/2021   11:20 PM 10/29/2021    3:10 PM 10/25/2021    8:34 AM 10/22/2021    3:36 PM  Advanced Directives  Does Patient Have a Medical Advance Directive? No No No No No No No  Would patient like information on creating a medical advance directive? No - Patient declined No - Patient declined No - Patient declined No - Patient declined No - Patient declined  No - Patient declined    Current Medications (verified) Outpatient Encounter Medications as of 03/28/2022  Medication Sig   acetaminophen (TYLENOL) 325 MG tablet Take 325 mg by mouth every 6 (six) hours as needed.   Ascorbic Acid (VITAMIN C PO) Take 1 tablet by mouth daily.   bisoprolol (ZEBETA) 5 MG tablet Take 5 mg by mouth daily.   CALCIUM PO Take 1 tablet by mouth daily.   Cholecalciferol (VITAMIN D-3 PO) Take 1 capsule by mouth daily.   COPPER PO Take by mouth.   cyanocobalamin (CYANOCOBALAMIN) 500 MCG tablet Take 500 mcg by mouth daily.   ferrous sulfate ([facility] IRON) 325 (65 FE) MG tablet TAKE 1 TABLET BY MOUTH EVERY DAY   levocetirizine (XYZAL) 5 MG tablet TAKE 1 TABLET (5 MG TOTAL) BY MOUTH AT BEDTIME AS NEEDED FOR ALLERGIES (DRAINAGE).    MAGNESIUM PO Take 1 tablet by mouth daily.   pantoprazole (PROTONIX) 40 MG tablet Take 1 tablet (40 mg total) by mouth 2 (two) times daily.   rosuvastatin (CRESTOR) 10 MG tablet TAKE 1 TABLET BY MOUTH AT  BEDTIME   traZODone (DESYREL) 50 MG tablet Take 1 tablet (50 mg total) by mouth at bedtime as needed for sleep.   VITAMIN E PO Take 1 tablet by mouth daily.   No facility-administered encounter medications on file as of 03/28/2022.    Allergies (verified) Sulfa antibiotics   History: Past Medical History:  Diagnosis Date   Anemia    Blood transfusion without reported diagnosis    Cataract    removed bilateral   Chronic cystitis    Diverticulitis    Heart murmur    Hyperlipidemia    Hypertension    Personal history of colonic polyps-adenomas 01/07/2012   2009 - 2 diminutive adenomas (prior polyps also) 01/07/2012 - 2 diminutive adenomas     Past Surgical History:  Procedure Laterality Date   BREAST BIOPSY Right    No Scar seen    COLONOSCOPY  01/2020   Dr. PHilarie Fredrickson Five 3 to 9 mm polyps in the ascending colon, Two 4 to 5 mm polyps in the transverse colon, diverticulosis, eryethema and petechia in left colon biopsied, external and internal hemorrhoids. Pathology  with tubular adenomas. Left colon biopsy was benign. Recommended 3 year surveillance.   ENTEROSCOPY N/A 09/26/2021   Procedure: ENTEROSCOPY;  Surgeon: Gatha Mayer, MD;  Location: [place];  Service: Gastroenterology;  Laterality: N/A;   ENTEROSCOPY N/A 12/19/2021   Procedure: ENTEROSCOPY;  Surgeon: Daneil Dolin, MD;  [facility];  Service: Endoscopy;  Laterality: N/A;   ESOPHAGOGASTRODUODENOSCOPY N/A 10/07/2021   Procedure: ESOPHAGOGASTRODUODENOSCOPY (EGD);  Surgeon: Daneil Dolin, MD;  [facility];  Service: Endoscopy;  Laterality: N/A;  EGD with enteroscopy  with pediatric colonoscope   ESOPHAGOGASTRODUODENOSCOPY (EGD) WITH PROPOFOL N/A 11/23/2019   Procedure:  ESOPHAGOGASTRODUODENOSCOPY (EGD) WITH PROPOFOL;  Surgeon: Daneil Dolin, MD;  [facility];  Service: Endoscopy;  Laterality: N/A;   ESOPHAGOGASTRODUODENOSCOPY (EGD) WITH PROPOFOL N/A 10/20/2020   Procedure: ESOPHAGOGASTRODUODENOSCOPY (EGD) WITH PROPOFOL;  Surgeon: Eloise Harman, DO;  [facility];  Service: Endoscopy;  Laterality: N/A;   ESOPHAGOGASTRODUODENOSCOPY (EGD) WITH PROPOFOL N/A 12/05/2020   Procedure: ESOPHAGOGASTRODUODENOSCOPY (EGD) WITH PROPOFOL;  Surgeon: Rogene Houston, MD;  [facility];  Service: Endoscopy;  Laterality: N/A;  with enteroscopy   ESOPHAGOGASTRODUODENOSCOPY (EGD) WITH PROPOFOL N/A 12/11/2020   Procedure: ESOPHAGOGASTRODUODENOSCOPY (EGD) WITH PROPOFOL;  Surgeon: Eloise Harman, DO;  [facility];  Service: Endoscopy;  Laterality: N/A;   ESOPHAGOGASTRODUODENOSCOPY (EGD) WITH PROPOFOL N/A 09/17/2021   Procedure: ESOPHAGOGASTRODUODENOSCOPY (EGD) WITH PROPOFOL;  Surgeon: Eloise Harman, DO;  [facility];  Service: Endoscopy;  Laterality: N/A;   ESOPHAGOGASTRODUODENOSCOPY (EGD) WITH PROPOFOL N/A 12/19/2021   Procedure: ESOPHAGOGASTRODUODENOSCOPY (EGD) WITH PROPOFOL;  Surgeon: Daneil Dolin, MD;  [facility];  Service: Endoscopy;  Laterality: N/A;  with pediatric scope   GIVENS CAPSULE STUDY N/A 11/23/2019   Procedure: GIVENS CAPSULE STUDY;  Surgeon: Daneil Dolin, MD;  [facility];  Service: Endoscopy;  Laterality: N/A;   GIVENS CAPSULE STUDY N/A 10/22/2020   Procedure: GIVENS CAPSULE STUDY;  Surgeon: Eloise Harman, DO;  [facility];  Service: Endoscopy;  Laterality: N/A;   GIVENS CAPSULE STUDY N/A 09/26/2021   Procedure: GIVENS CAPSULE STUDY;  Surgeon: Gatha Mayer, MD;  Location: [place];  Service: Gastroenterology;  Laterality: N/A;   HEMOSTASIS CLIP PLACEMENT  12/11/2020   Procedure: HEMOSTASIS CLIP PLACEMENT;  Surgeon: Eloise Harman, DO;   [facility];  Service: Endoscopy;;   IR ANGIOGRAM SELECTIVE EACH ADDITIONAL VESSEL  12/05/2020   IR ANGIOGRAM SELECTIVE EACH ADDITIONAL VESSEL  12/05/2020   IR ANGIOGRAM SELECTIVE EACH ADDITIONAL VESSEL  12/05/2020   IR ANGIOGRAM SELECTIVE EACH ADDITIONAL VESSEL  12/13/2020   IR ANGIOGRAM SELECTIVE EACH ADDITIONAL VESSEL  12/13/2020   IR ANGIOGRAM VISCERAL SELECTIVE  12/05/2020   IR ANGIOGRAM VISCERAL SELECTIVE  12/13/2020   IR EMBO ART  VEN HEMORR LYMPH EXTRAV  INC GUIDE ROADMAPPING  12/05/2020   IR EMBO ART  VEN HEMORR LYMPH EXTRAV  INC GUIDE ROADMAPPING  12/13/2020   IR RADIOLOGIST EVAL & MGMT  01/11/2021   IR US GUIDE VASC ACCESS LEFT  12/13/2020   IR US GUIDE VASC ACCESS RIGHT  12/05/2020   SUBMUCOSAL TATTOO INJECTION  09/26/2021   Procedure: SUBMUCOSAL TATTOO INJECTION;  Surgeon: Gatha Mayer, MD;  Location: [facility];  Service: Gastroenterology;;   Family History  Problem Relation Age of Onset   Colon cancer Mother 57       80's   Breast cancer Sister    Asthma  Brother    Colon polyps Neg Hx    Esophageal cancer Neg Hx    Rectal cancer Neg Hx    Stomach cancer Neg Hx    Social History   Socioeconomic History   Marital status: Married    Spouse name: Not on file   Number of children: 1   Years of education: Not on file   Highest education level: Not on file  Occupational History   Occupation: retired  Tobacco Use   Smoking status: Former    Types: Cigarettes    Quit date: 12/23/1984    Years since quitting: 37.2   Smokeless tobacco: Never  Vaping Use   Vaping Use: Never used  Substance and Sexual Activity   Alcohol use: Yes    Comment: wine occasionally   Drug use: No   Sexual activity: Not on file  Other Topics Concern   Not on file  Social History Narrative   Patient is married and retired and has 1 grown child   Social Determinants of Radio broadcast assistant Strain: [place]  (03/28/2022)   Overall Financial Resource Strain  (CARDIA)    Difficulty of Paying Living Expenses: Not hard at all  Food Insecurity: No [place] (03/28/2022)   Hunger Vital Sign    Worried About Running Out of Food in the Last Year: Never true    [place] in the Last Year: Never true  Transportation Needs: No Transportation Needs (03/28/2022)   PRAPARE - Hydrologist (Medical): No    Lack of Transportation (Non-Medical): No  Physical Activity: Insufficiently Active (03/28/2022)   Exercise Vital Sign    Days of Exercise per Week: 3 days    Minutes of Exercise per Session: 30 min  Stress: No Stress Concern Present (03/28/2022)   [place]    Feeling of Stress : Not at all  Social Connections: Moderately Isolated (03/28/2022)   Social Connection and Isolation Panel [NHANES]    Frequency of Communication with Friends and Family: More than three times a week    Frequency of Social Gatherings with Friends and Family: More than three times a week    Attends Religious Services: Never    Marine scientist or Organizations: No    Attends Music therapist: Never    Marital Status: Married    Tobacco Counseling Counseling given: Not Answered   Clinical Intake:  Pre-visit preparation completed: Yes  Pain : No/denies pain     Nutritional Risks: None Diabetes: No  How often do you need to have someone help you when you read instructions, pamphlets, or other written materials from your doctor or pharmacy?: 1 - Never  Diabetic?no   Interpreter Needed?: No  Information entered by :: Jadene Pierini, LPN   Activities of Daily Living    03/28/2022    8:22 AM 12/18/2021   11:00 PM  In your present state of health, do you have any difficulty performing the following activities:  Hearing? 0 0  Vision? 0 0  Difficulty concentrating or making decisions? 0 0  Walking or climbing stairs? 0 0  Dressing or bathing? 0 0   Doing errands, shopping? 0 0  Preparing Food and eating ? N   Using the Toilet? N   In the past six months, have you accidently leaked urine? N   Do you have problems with loss of bowel control? N  Managing your Medications? N   Managing your Finances? N   Housekeeping or managing your Housekeeping? N     Patient Care Team: [name], DO as PCP - General (Family Medicine) Minus Breeding, MD as PCP - Cardiology (Cardiology) Derek Jack, MD as Consulting Physician (Hematology) Eloise Harman, DO as Consulting Physician (Gastroenterology)  Indicate any recent Medical Services you may have received from other than [facility] providers in the past year (date may be approximate).     Assessment:   This is a routine wellness examination for [facility].  Hearing/Vision screen Vision Screening - Comments:: Wears rx glasses - up to date with routine eye exams with  [facility].  Dietary issues and exercise activities discussed: Current Exercise Habits: Home exercise routine, Type of exercise: walking, Time (Minutes): 30, Frequency (Times/Week): 3, Weekly Exercise (Minutes/Week): 90, Intensity: Mild, Exercise limited by: None identified   Goals Addressed             This Visit's Progress    Cut out extra servings   On track      Depression Screen    03/28/2022    8:20 AM 10/17/2021   10:24 AM 10/02/2021    9:40 AM 09/13/2021    9:34 AM 06/11/2021    1:38 PM 03/21/2021    8:26 AM 03/07/2021    2:35 PM  PHQ 2/9 Scores  PHQ - 2 Score 0 0 0 0 1 0 0  PHQ- 9 Score  '3 2  5  3    '$ Fall Risk    03/28/2022    8:19 AM 10/17/2021   10:26 AM 10/17/2021   10:23 AM 10/02/2021    9:40 AM 09/13/2021    9:34 AM  Fall Risk   Falls in the past year? 0 0 0 0 0  Number falls in past yr: 0      Injury with Fall? 0      Risk for fall due to : No Fall Risks      Follow up Falls prevention discussed        [place]:  Any stairs in or  around the home? Yes  If so, are there any without handrails? No  Home free of loose throw rugs in walkways, pet beds, electrical cords, etc? Yes  Adequate lighting in your home to reduce risk of falls? Yes   ASSISTIVE DEVICES UTILIZED TO PREVENT FALLS:  Life alert? No  Use of a cane, walker or w/c? No  Grab bars in the bathroom? Yes  Shower chair or bench in shower? Yes  Elevated toilet seat or a handicapped toilet? Yes        03/28/2022    8:22 AM 03/21/2021    8:32 AM  6CIT Screen  What Year? 0 points 0 points  What month? 0 points 0 points  What time? 0 points 0 points  Count back from 20 0 points 0 points  Months in reverse 0 points 0 points  Repeat phrase 0 points 0 points  Total Score 0 points 0 points    Immunizations Immunization History  Administered Date(s) Administered   Fluad Quad(high Dose 65+) 11/23/2018, 12/14/2019, 12/05/2020   Influenza Inj Mdck Quad Pf 01/07/2017   Influenza-Unspecified 01/01/2016, 01/07/2017   Moderna Sars-Covid-2 Vaccination 04/30/2019, 05/29/2019, 01/19/2020, 07/20/2020   Pneumococcal Conjugate-13 03/31/2019   Pneumococcal Polysaccharide-23 02/27/2010, 11/08/2013   Tdap 01/29/2012   Zoster Recombinat (Shingrix) 08/01/2016, 10/31/2016    TDAP status: Due,  Education has been provided regarding the importance of this vaccine. Advised may receive this vaccine at local pharmacy or Health Dept. Aware to provide a copy of the vaccination record if obtained from local pharmacy or Health Dept. Verbalized acceptance and understanding.  Flu Vaccine status: Up to date  Pneumococcal vaccine status: Up to date  Covid-19 vaccine status: Completed vaccines  Qualifies for Shingles Vaccine? Yes   Zostavax completed Yes   Shingrix Completed?: Yes  Screening Tests Health Maintenance  Topic Date Due   COVID-19 Vaccine (5 - 2023-24 season) 11/23/2021   DTaP/Tdap/Td (2 - Td or Tdap) 01/28/2022   INFLUENZA VACCINE  06/23/2022 (Originally  10/23/2021)   COLONOSCOPY (Pts 45-13yr Insurance coverage will need to be confirmed)  01/25/2023   Medicare Annual Wellness (AWV)  03/29/2023   DEXA SCAN  06/13/2023   Pneumonia Vaccine 77 Years old  Completed   Hepatitis C Screening  Completed   Zoster Vaccines- Shingrix  Completed   HPV VACCINES  Aged Out    Health Maintenance  Health Maintenance Due  Topic Date Due   COVID-19 Vaccine (5 - 2023-24 season) 11/23/2021   DTaP/Tdap/Td (2 - Td or Tdap) 01/28/2022    Colorectal cancer screening: No longer required.   Mammogram status: No longer required due to age.  Bone Density status: Completed 06/12/2021. Results reflect: Bone density results: OSTEOPENIA. Repeat every 5 years.  Lung Cancer Screening: (Low Dose CT Chest recommended if Age 78-80years, 30 pack-year currently smoking OR have quit w/in 15years.) does not qualify.   Lung Cancer Screening Referral: n/a  Additional Screening:  Hepatitis C Screening: does not qualify; Completed 02/24/2015  Vision Screening: Recommended annual ophthalmology exams for early detection of glaucoma and other disorders of the eye. Is the patient up to date with their annual eye exam?  Yes  Who is the provider or what is the name of the office in which the patient attends annual eye exams? [facility] If pt is not established with a provider, would they like to be referred to a provider to establish care? No .   Dental Screening: Recommended annual dental exams for proper oral hygiene  Community Resource Referral / Chronic Care Management: CRR required this visit?  No   CCM required this visit?  No      Plan:     I have personally reviewed and noted the following in the patient's chart:   Medical and social history Use of alcohol, tobacco or illicit drugs  Current medications and supplements including opioid prescriptions. Patient is not currently taking opioid prescriptions. Functional ability and status Nutritional  status Physical activity Advanced directives List of other physicians Hospitalizations, surgeries, and ER visits in previous 12 months Vitals Screenings to include cognitive, depression, and falls Referrals and appointments  In addition, I have reviewed and discussed with patient certain preventive protocols, quality metrics, and best practice recommendations. A written personalized care plan for preventive services as well as general preventive health recommendations were provided to patient.     LDaphane Shepherd LPN   16/06/4032  Nurse Notes: Due DTAP vaccine

## 2022-03-29 ENCOUNTER — Inpatient Hospital Stay: Payer: Medicare Other | Attending: Physician Assistant | Admitting: Nurse Practitioner

## 2022-03-29 DIAGNOSIS — D500 Iron deficiency anemia secondary to blood loss (chronic): Secondary | ICD-10-CM

## 2022-03-29 NOTE — Progress Notes (Signed)
   Virtual Visit via Telephone Note [facility]  I connected with Stacey Reyes  on 03/29/22 at 2:20 PM EST by telephone and verified that I am speaking with the correct person using two identifiers.  Location: Patient: Home Provider: home   I discussed the limitations, risks, security and privacy concerns of performing an evaluation and management service by telephone and the availability of in person appointments. I also discussed with the patient that there may be a patient responsible charge related to this service. The patient expressed understanding and agreed to proceed.  REASON FOR VISIT:  Follow-up for iron deficiency   CURRENT THERAPY: Intermittent IV iron infusions and blood transfusions  INTERVAL HISTORY: Stacey Reyes is contacted today for follow-up of her iron deficiency anemia.  She says she feels great and denies complaints. Energy level is wonderful. Denies complaints.     OBSERVATIONS/OBJECTIVE: Review of Systems  Constitutional:  Negative for chills, diaphoresis, fever, malaise/fatigue and weight loss.  HENT:  Negative for congestion, ear discharge, ear pain, nosebleeds, sinus pain, sore throat and tinnitus.   Eyes: Negative.  Negative for pain and redness.  Respiratory: Negative.  Negative for cough, hemoptysis, sputum production, shortness of breath, wheezing and stridor.   Cardiovascular:  Negative for chest pain, palpitations, orthopnea, claudication and leg swelling.  Gastrointestinal:  Negative for abdominal pain, blood in stool, constipation, diarrhea, heartburn, nausea and vomiting.  Genitourinary: Negative.   Musculoskeletal: Negative.  Negative for falls, joint pain and myalgias.  Skin: Negative.  Negative for rash.  Neurological:  Negative for dizziness, tingling, loss of consciousness, weakness and headaches.  Endo/Heme/Allergies: Negative.   Psychiatric/Behavioral: Negative.  Negative for depression. The patient is not  nervous/anxious and does not have insomnia.   All other systems reviewed and are negative.    PHYSICAL EXAM alert. Speaking in full sentences. No apparent distress.    ASSESSMENT & PLAN: 1.  Iron deficiency anemia secondary to chronic blood loss - d/t AVMs and chronic blood loss requiring multiple transfusions, frequent EGDs, colonoscopies, capsule studies, and IR interventions. Closely followed by Dr Areta Haber - Last received [facility] 01/08/22.  - Labs today reviewed. Hmg normal at 14.1. Normocytic. Ferritin 51, iron sat25%.  - Asymptomatic and feels well.  - PLAN: hold iv iron - Continue hemoglobin checks with pcp particularly if concerning symptoms - repeat labs in 2 months   2.  B12 deficiency - She is taking B12 tablet 1000 mcg daily - Most recent labs (12/18/2021): Vitamin B12 normal at 341 with normal MMA - PLAN: Continue B12 supplement.  We will recheck levels annually (next in September 2024)   FOLLOW UP INSTRUCTIONS: Hol dIV iron 2 months- lab (cbc, cmp, ferritin, iron studies) Few days to week later- see Rebekah for virtual visit- la  I discussed the assessment and treatment plan with the patient. The patient was provided an opportunity to ask questions and all were answered. The patient agreed with the plan and demonstrated an understanding of the instructions.   The patient was advised to call back or seek an in-person evaluation if the symptoms worsen or if the condition fails to improve as anticipated.  I provided 25 minutes of non-face-to-face time during this encounter.  Beckey Rutter, DNP, AGNP-C

## 2022-04-01 ENCOUNTER — Ambulatory Visit
Admission: RE | Admit: 2022-04-01 | Discharge: 2022-04-01 | Disposition: A | Discharge status: Home / Self Care | Payer: Medicare Other | Source: Ambulatory Visit | Attending: Family Medicine | Admitting: Family Medicine

## 2022-04-01 ENCOUNTER — Encounter: Payer: Self-pay | Admitting: Hematology

## 2022-04-01 ENCOUNTER — Other Ambulatory Visit: Payer: Self-pay | Admitting: Family Medicine

## 2022-04-01 DIAGNOSIS — Z1231 Encounter for screening mammogram for malignant neoplasm of breast: Secondary | ICD-10-CM | POA: Diagnosis not present

## 2022-04-03 ENCOUNTER — Other Ambulatory Visit: Payer: Self-pay | Admitting: Family Medicine

## 2022-04-03 DIAGNOSIS — R928 Other abnormal and inconclusive findings on diagnostic imaging of breast: Secondary | ICD-10-CM

## 2022-04-04 ENCOUNTER — Other Ambulatory Visit: Payer: Self-pay

## 2022-04-04 DIAGNOSIS — R928 Other abnormal and inconclusive findings on diagnostic imaging of breast: Secondary | ICD-10-CM

## 2022-04-04 NOTE — Progress Notes (Signed)
Per note from [facility] patient wants to go to [facility] - orders placed and faxed

## 2022-04-05 NOTE — Telephone Encounter (Signed)
Could her follow up imaging be arranged in [place]?

## 2022-04-14 ENCOUNTER — Other Ambulatory Visit: Payer: Self-pay | Admitting: Family Medicine

## 2022-04-14 DIAGNOSIS — G4700 Insomnia, unspecified: Secondary | ICD-10-CM

## 2022-04-14 NOTE — Telephone Encounter (Signed)
Last office visit 10/17/21 Last refill 04/02/21, #90, 3 refills

## 2022-04-25 ENCOUNTER — Encounter (HOSPITAL_COMMUNITY): Payer: Self-pay

## 2022-04-25 ENCOUNTER — Ambulatory Visit (HOSPITAL_COMMUNITY)
Admission: RE | Admit: 2022-04-25 | Discharge: 2022-04-25 | Disposition: A | Discharge status: Home / Self Care | Payer: Medicare Other | Source: Ambulatory Visit | Attending: Family Medicine | Admitting: Family Medicine

## 2022-04-25 ENCOUNTER — Other Ambulatory Visit: Payer: Self-pay | Admitting: Family Medicine

## 2022-04-25 DIAGNOSIS — R928 Other abnormal and inconclusive findings on diagnostic imaging of breast: Secondary | ICD-10-CM | POA: Insufficient documentation

## 2022-04-25 DIAGNOSIS — R921 Mammographic calcification found on diagnostic imaging of breast: Secondary | ICD-10-CM | POA: Diagnosis not present

## 2022-04-30 ENCOUNTER — Encounter: Payer: Self-pay | Admitting: Internal Medicine

## 2022-04-30 DIAGNOSIS — L821 Other seborrheic keratosis: Secondary | ICD-10-CM | POA: Diagnosis not present

## 2022-04-30 DIAGNOSIS — D225 Melanocytic nevi of trunk: Secondary | ICD-10-CM | POA: Diagnosis not present

## 2022-04-30 DIAGNOSIS — L570 Actinic keratosis: Secondary | ICD-10-CM | POA: Diagnosis not present

## 2022-04-30 DIAGNOSIS — L814 Other melanin hyperpigmentation: Secondary | ICD-10-CM | POA: Diagnosis not present

## 2022-05-02 ENCOUNTER — Encounter: Payer: Self-pay | Admitting: Hematology

## 2022-05-09 ENCOUNTER — Encounter: Payer: Self-pay | Admitting: Family Medicine

## 2022-05-09 ENCOUNTER — Ambulatory Visit (INDEPENDENT_AMBULATORY_CARE_PROVIDER_SITE_OTHER): Payer: Medicare Other | Admitting: Family Medicine

## 2022-05-09 VITALS — BP 95/53 | HR 80 | Temp 97.2°F | Ht 66.0 in | Wt 198.5 lb

## 2022-05-09 DIAGNOSIS — J0140 Acute pansinusitis, unspecified: Secondary | ICD-10-CM | POA: Diagnosis not present

## 2022-05-09 MED ORDER — AMOXICILLIN 875 MG PO TABS: 875.0000 mg | ORAL_TABLET | Freq: Two times a day (BID) | ORAL | 0 refills | Status: AC

## 2022-05-09 NOTE — Progress Notes (Signed)
Acute Office Visit  Subjective:     Patient ID: Stacey Reyes, female    DOB: 01/07/1946, 77 y.o.   MRN: [id number]  Chief Complaint  Patient presents with   URI    URI  This is a new problem. Episode onset: about 2 weeks. The problem has been gradually worsening. There has been no fever. Associated symptoms include congestion, coughing, headaches, rhinorrhea, sinus pain and sneezing. Pertinent negatives include no abdominal pain, chest pain, diarrhea, dysuria, ear pain, joint pain, nausea, neck pain, plugged ear sensation, rash, sore throat, swollen glands, vomiting or wheezing. She has tried nothing for the symptoms. The treatment provided no relief.    Review of Systems  HENT:  Positive for congestion, rhinorrhea, sinus pain and sneezing. Negative for ear pain and sore throat.   Respiratory:  Positive for cough. Negative for wheezing.   Cardiovascular:  Negative for chest pain.  Gastrointestinal:  Negative for abdominal pain, diarrhea, nausea and vomiting.  Genitourinary:  Negative for dysuria.  Musculoskeletal:  Negative for joint pain and neck pain.  Skin:  Negative for rash.  Neurological:  Positive for headaches.        Objective:    BP (!) 95/53   Pulse 80   Temp (!) 97.2 F (36.2 C) (Temporal)   Ht 5' 6"$  (1.676 m)   Wt 198 lb 8 oz (90 kg)   SpO2 94%   BMI 32.04 kg/m    Physical Exam Vitals and nursing note reviewed.  Constitutional:      General: She is not in acute distress.    Appearance: She is not ill-appearing, toxic-appearing or diaphoretic.  HENT:     Right Ear: Ear canal and external ear normal. A middle ear effusion is present. Tympanic membrane is not perforated, erythematous, retracted or bulging.     Left Ear: Ear canal and external ear normal.  No middle ear effusion. Tympanic membrane is not perforated, erythematous, retracted or bulging.     Nose: Congestion present.     Right Sinus: Maxillary sinus tenderness and frontal sinus  tenderness present.     Left Sinus: Maxillary sinus tenderness and frontal sinus tenderness present.     Mouth/Throat:     Mouth: Mucous membranes are moist.     Pharynx: Oropharynx is clear. No oropharyngeal exudate or posterior oropharyngeal erythema.  Eyes:     General:        Right eye: No discharge.        Left eye: No discharge.     Conjunctiva/sclera: Conjunctivae normal.  Cardiovascular:     Rate and Rhythm: Normal rate and regular rhythm.     Heart sounds: Murmur heard.     Systolic murmur is present with a grade of 3/6.  Pulmonary:     Effort: Pulmonary effort is normal. No respiratory distress.     Breath sounds: Normal breath sounds. No wheezing or rhonchi.  Musculoskeletal:     Cervical back: Neck supple. No rigidity.     Right lower leg: No edema.     Left lower leg: No edema.  Skin:    General: Skin is warm and dry.  Neurological:     General: No focal deficit present.     Mental Status: She is alert and oriented to person, place, and time.  Psychiatric:        Mood and Affect: Mood normal.        Behavior: Behavior normal.     No results found for  any visits on 05/09/22.      Assessment & Plan:   Yenna was seen today for uri.  Diagnoses and all orders for this visit:  Acute non-recurrent pansinusitis Amoxicillin as below. Discussed restarting xyzal daily. Discussed symptomatic care and return precautions.  -     amoxicillin (AMOXIL) 875 MG tablet; Take 1 tablet (875 mg total) by mouth 2 (two) times daily for 10 days.   Return if symptoms worsen or fail to improve.  The patient indicates understanding of these issues and agrees with the plan.   Gwenlyn Perking, FNP

## 2022-05-10 ENCOUNTER — Ambulatory Visit
Admission: RE | Admit: 2022-05-10 | Discharge: 2022-05-10 | Disposition: A | Discharge status: Home / Self Care | Payer: Medicare Other | Source: Ambulatory Visit | Attending: Family Medicine | Admitting: Family Medicine

## 2022-05-10 DIAGNOSIS — R921 Mammographic calcification found on diagnostic imaging of breast: Secondary | ICD-10-CM

## 2022-05-10 DIAGNOSIS — R928 Other abnormal and inconclusive findings on diagnostic imaging of breast: Secondary | ICD-10-CM

## 2022-05-10 DIAGNOSIS — N6012 Diffuse cystic mastopathy of left breast: Secondary | ICD-10-CM | POA: Diagnosis not present

## 2022-05-10 HISTORY — PX: BREAST BIOPSY: SHX20

## 2022-05-12 ENCOUNTER — Other Ambulatory Visit: Payer: Self-pay | Admitting: Cardiology

## 2022-05-15 ENCOUNTER — Telehealth: Payer: Self-pay | Admitting: Family Medicine

## 2022-05-15 ENCOUNTER — Other Ambulatory Visit: Payer: Self-pay | Admitting: Family Medicine

## 2022-05-15 DIAGNOSIS — N6092 Unspecified benign mammary dysplasia of left breast: Secondary | ICD-10-CM

## 2022-05-15 NOTE — Telephone Encounter (Signed)
Attempted to reach patient.  Went to [facility].  I will place referral to breast surgeon in [place].

## 2022-05-16 ENCOUNTER — Other Ambulatory Visit: Payer: Self-pay

## 2022-05-16 MED ORDER — PANTOPRAZOLE SODIUM 40 MG PO TBEC: 40.0000 mg | DELAYED_RELEASE_TABLET | Freq: Two times a day (BID) | ORAL | 0 refills | Status: DC

## 2022-05-17 ENCOUNTER — Telehealth (INDEPENDENT_AMBULATORY_CARE_PROVIDER_SITE_OTHER): Payer: Medicare Other | Admitting: Family

## 2022-05-17 ENCOUNTER — Encounter: Payer: Self-pay | Admitting: Family

## 2022-05-17 DIAGNOSIS — T3695XA Adverse effect of unspecified systemic antibiotic, initial encounter: Secondary | ICD-10-CM | POA: Diagnosis not present

## 2022-05-17 DIAGNOSIS — B3731 Acute candidiasis of vulva and vagina: Secondary | ICD-10-CM | POA: Diagnosis not present

## 2022-05-17 DIAGNOSIS — B379 Candidiasis, unspecified: Secondary | ICD-10-CM

## 2022-05-17 MED ORDER — FLUCONAZOLE 150 MG PO TABS: 150.0000 mg | ORAL_TABLET | ORAL | 1 refills | Status: DC | PRN

## 2022-05-17 NOTE — Progress Notes (Signed)
Virtual Visit Consent   [name], you are scheduled for a virtual visit with a [facility] provider today. Just as with appointments in the office, your consent must be obtained to participate. Your consent will be active for this visit and any virtual visit you may have with one of our providers in the next 365 days. If you have a MyChart account, a copy of this consent can be sent to you electronically.  As this is a virtual visit, video technology does not allow for your provider to perform a traditional examination. This may limit your provider's ability to fully assess your condition. If your provider identifies any concerns that need to be evaluated in person or the need to arrange testing (such as labs, EKG, etc.), we will make arrangements to do so. Although advances in technology are sophisticated, we cannot ensure that it will always work on either your end or our end. If the connection with a video visit is poor, the visit may have to be switched to a telephone visit. With either a video or telephone visit, we are not always able to ensure that we have a secure connection.  By engaging in this virtual visit, you consent to the provision of healthcare and authorize for your insurance to be billed (if applicable) for the services provided during this visit. Depending on your insurance coverage, you may receive a charge related to this service.  I need to obtain your verbal consent now. Are you willing to proceed with your visit today? [name] has provided verbal consent on 05/17/2022 for a virtual visit (video or telephone). Stacey Dun, FNP  Date: 05/17/2022 8:49 AM  Virtual Visit via Video Note   I, Stacey Reyes, connected with  Stacey Reyes  ([id number], May 06, 1945) on 05/17/22 at  8:35 AM EST by a video-enabled telemedicine application and verified that I am speaking with the correct person using two identifiers.  Location: Patient: Virtual Visit Location  Patient: Home Provider: Virtual Visit Location Provider: Office/Clinic   I discussed the limitations of evaluation and management by telemedicine and the availability of in person appointments. The patient expressed understanding and agreed to proceed.    History of Present Illness: Stacey Reyes is a 77 y.o. who identifies as a female who was assigned female at birth, and is being seen today for yeast infection after starting Amoxicillin.  HPI: Vaginal Itching The patient's primary symptoms include genital itching. The patient's pertinent negatives include no genital odor or vaginal discharge. The current episode started yesterday. The problem occurs intermittently. The problem has been unchanged. She has tried antifungals for the symptoms. The treatment provided moderate relief.    Problems:  Patient Active Problem List   Diagnosis Date Noted   GI bleed 12/18/2021   GERD (gastroesophageal reflux disease) 12/18/2021   Gastrointestinal hemorrhage    Hypocalcemia 10/06/2021   Obesity (BMI 30-39.9) 10/06/2021   History of GI bleed 09/25/2021   Hypokalemia 09/16/2021   Elevated troponin 10/20/2020   Symptomatic anemia 10/19/2020   Morning headache 03/08/2020   Hypertrophic cardiomyopathy ([place]) 01/17/2020   Iron deficiency anemia due to chronic blood loss 12/21/2019   Need for immunization against influenza 12/14/2019   Acute blood loss anemia 11/22/2019   Educated about COVID-19 virus infection 07/05/2019   Dyspnea on exertion 07/05/2019   B12 deficiency 03/25/2019   Absolute anemia 03/24/2019   Osteopenia after menopause 04/22/2018   Cardiac murmur due to mitral valve disorder 03/03/2018   Heart  murmur 03/03/2018   LVH (left ventricular hypertrophy) 11/20/2015   Arrhythmia 10/11/2015   Hypertension, essential 02/24/2015   Mixed hyperlipidemia 02/24/2015   History of colonic polyps 01/07/2012    Allergies:  Allergies  Allergen Reactions   Sulfa Antibiotics Rash    Medications:  Current Outpatient Medications:    fluconazole (DIFLUCAN) 150 MG tablet, Take 1 tablet (150 mg total) by mouth every three (3) days as needed., Disp: 3 tablet, Rfl: 1   acetaminophen (TYLENOL) 325 MG tablet, Take 325 mg by mouth every 6 (six) hours as needed., Disp: , Rfl:    amoxicillin (AMOXIL) 875 MG tablet, Take 1 tablet (875 mg total) by mouth 2 (two) times daily for 10 days., Disp: 20 tablet, Rfl: 0   Ascorbic Acid (VITAMIN C PO), Take 1 tablet by mouth daily., Disp: , Rfl:    bisoprolol (ZEBETA) 5 MG tablet, TAKE ONE-HALF TABLET BY MOUTH  DAILY, Disp: 50 tablet, Rfl: 0   CALCIUM PO, Take 1 tablet by mouth daily., Disp: , Rfl:    Cholecalciferol (VITAMIN D-3 PO), Take 1 capsule by mouth daily., Disp: , Rfl:    COPPER PO, Take by mouth., Disp: , Rfl:    cyanocobalamin (CYANOCOBALAMIN) 500 MCG tablet, Take 500 mcg by mouth daily., Disp: , Rfl:    ferrous sulfate ([employer] IRON) 325 (65 FE) MG tablet, TAKE 1 TABLET BY MOUTH EVERY DAY, Disp: 90 tablet, Rfl: 3   hydrochlorothiazide (MICROZIDE) 12.5 MG capsule, Take 12.5 mg by mouth daily. (Patient not taking: Reported on 05/09/2022), Disp: , Rfl:    levocetirizine (XYZAL) 5 MG tablet, TAKE 1 TABLET (5 MG TOTAL) BY MOUTH AT BEDTIME AS NEEDED FOR ALLERGIES (DRAINAGE). (Patient not taking: Reported on 05/09/2022), Disp: 90 tablet, Rfl: 1   MAGNESIUM PO, Take 1 tablet by mouth daily., Disp: , Rfl:    pantoprazole (PROTONIX) 40 MG tablet, Take 1 tablet (40 mg total) by mouth 2 (two) times daily., Disp: 180 tablet, Rfl: 0   rosuvastatin (CRESTOR) 10 MG tablet, TAKE 1 TABLET BY MOUTH AT  BEDTIME, Disp: 100 tablet, Rfl: 2   traZODone (DESYREL) 50 MG tablet, TAKE 1 TABLET BY MOUTH AT BEDTIME AS NEEDED FOR SLEEP. (Patient not taking: Reported on 05/09/2022), Disp: 90 tablet, Rfl: 3   Vitamin D, Ergocalciferol, (DRISDOL) 1.25 MG (50000 UNIT) CAPS capsule, TAKE 1 CAPSULE BY MOUTH  ONCE WEEKLY, Disp: 15 capsule, Rfl: 2   VITAMIN E PO, Take 1 tablet  by mouth daily., Disp: , Rfl:   Observations/Objective: Patient is well-developed, well-nourished in no acute distress.  Resting comfortably  at home.  Head is normocephalic, atraumatic.  No labored breathing.  Speech is clear and coherent with logical content.  Patient is alert and oriented at baseline.    Assessment and Plan: 1. Vagina, candidiasis - fluconazole (DIFLUCAN) 150 MG tablet; Take 1 tablet (150 mg total) by mouth every three (3) days as needed.  Dispense: 3 tablet; Refill: 1  2. Antibiotic-induced yeast infection - fluconazole (DIFLUCAN) 150 MG tablet; Take 1 tablet (150 mg total) by mouth every three (3) days as needed.  Dispense: 3 tablet; Refill: 1  Requesting a refill on diflucan as she has to take every time she uses antibiotics.  Keep clean and dry Avoid scratching Follow up if symptoms worsen or do not improve    Follow Up Instructions: I discussed the assessment and treatment plan with the patient. The patient was provided an opportunity to ask questions and all were answered. The patient  agreed with the plan and demonstrated an understanding of the instructions.  A copy of instructions were sent to the patient via MyChart unless otherwise noted below.     The patient was advised to call back or seek an in-person evaluation if the symptoms worsen or if the condition fails to improve as anticipated.  Time:  I spent 8 minutes with the patient via telehealth technology discussing the above problems/concerns.    Stacey Dun, FNP

## 2022-05-21 ENCOUNTER — Ambulatory Visit (INDEPENDENT_AMBULATORY_CARE_PROVIDER_SITE_OTHER): Payer: Medicare Other | Admitting: Nurse Practitioner

## 2022-05-21 ENCOUNTER — Encounter: Payer: Self-pay | Admitting: Nurse Practitioner

## 2022-05-21 VITALS — BP 130/72 | HR 64 | Temp 97.6°F | Resp 20 | Ht 66.0 in | Wt 198.0 lb

## 2022-05-21 DIAGNOSIS — N3001 Acute cystitis with hematuria: Secondary | ICD-10-CM

## 2022-05-21 DIAGNOSIS — D500 Iron deficiency anemia secondary to blood loss (chronic): Secondary | ICD-10-CM | POA: Diagnosis not present

## 2022-05-21 DIAGNOSIS — R319 Hematuria, unspecified: Secondary | ICD-10-CM | POA: Diagnosis not present

## 2022-05-21 DIAGNOSIS — B379 Candidiasis, unspecified: Secondary | ICD-10-CM

## 2022-05-21 DIAGNOSIS — B3731 Acute candidiasis of vulva and vagina: Secondary | ICD-10-CM | POA: Diagnosis not present

## 2022-05-21 DIAGNOSIS — T3695XA Adverse effect of unspecified systemic antibiotic, initial encounter: Secondary | ICD-10-CM

## 2022-05-21 LAB — MICROSCOPIC EXAMINATION
RBC, Urine: NONE SEEN /hpf (ref 0–2)
Renal Epithel, UA: NONE SEEN /hpf
WBC, UA: >30 /hpf — AB (ref 0–5)

## 2022-05-21 LAB — URINALYSIS, COMPLETE
Bilirubin, UA: NEGATIVE
Glucose, UA: NEGATIVE
Nitrite, UA: POSITIVE — AB
Specific Gravity, UA: 1.02 (ref 1.005–1.030)
Urobilinogen, Ur: 1 mg/dL (ref 0.2–1.0)
pH, UA: 7 (ref 5.0–7.5)

## 2022-05-21 MED ORDER — CEPHALEXIN 500 MG PO CAPS: 500.0000 mg | ORAL_CAPSULE | Freq: Two times a day (BID) | ORAL | 0 refills | Status: DC

## 2022-05-21 MED ORDER — FLUCONAZOLE 150 MG PO TABS: 150.0000 mg | ORAL_TABLET | ORAL | 1 refills | Status: DC | PRN

## 2022-05-21 NOTE — Progress Notes (Signed)
Subjective:    Patient ID: Stacey Reyes, female    DOB: 03/26/1945, 77 y.o.   MRN: [id number]   Chief Complaint: Hematuria   Hematuria Irritative symptoms include urgency. Associated symptoms include dysuria. Pertinent negatives include no chills, fever or flank pain.   Patient says she saw blood in her urine. Has had some dysuria with urgency and frequency. She used ome oTC meds such as AZO which helped but is back with a vengece. Patient Active Problem List   Diagnosis Date Noted   GI bleed 12/18/2021   GERD (gastroesophageal reflux disease) 12/18/2021   Gastrointestinal hemorrhage    Hypocalcemia 10/06/2021   Obesity (BMI 30-39.9) 10/06/2021   History of GI bleed 09/25/2021   Hypokalemia 09/16/2021   Elevated troponin 10/20/2020   Symptomatic anemia 10/19/2020   Morning headache 03/08/2020   Hypertrophic cardiomyopathy ([place]) 01/17/2020   Iron deficiency anemia due to chronic blood loss 12/21/2019   Need for immunization against influenza 12/14/2019   Acute blood loss anemia 11/22/2019   Educated about COVID-19 virus infection 07/05/2019   Dyspnea on exertion 07/05/2019   B12 deficiency 03/25/2019   Absolute anemia 03/24/2019   Osteopenia after menopause 04/22/2018   Cardiac murmur due to mitral valve disorder 03/03/2018   Heart murmur 03/03/2018   LVH (left ventricular hypertrophy) 11/20/2015   Arrhythmia 10/11/2015   Hypertension, essential 02/24/2015   Mixed hyperlipidemia 02/24/2015   History of colonic polyps 01/07/2012       Review of Systems  Constitutional:  Negative for chills and fever.  Genitourinary:  Positive for dysuria, hematuria, pelvic pain and urgency. Negative for flank pain, vaginal discharge and vaginal pain.       Objective:   Physical Exam Vitals reviewed.  Constitutional:      Appearance: Normal appearance.  Cardiovascular:     Rate and Rhythm: Normal rate.     Heart sounds: Normal heart sounds.  Pulmonary:     Breath  sounds: Normal breath sounds.  Abdominal:     Tenderness: There is no right CVA tenderness or left CVA tenderness.  Skin:    General: Skin is warm.  Neurological:     General: No focal deficit present.     Mental Status: She is alert and oriented to person, place, and time.  Psychiatric:        Mood and Affect: Mood normal.        Behavior: Behavior normal.    BP 130/72   Pulse 64   Temp 97.6 F (36.4 C) (Temporal)   Resp 20   Ht 5' 6"$  (1.676 m)   Wt 198 lb (89.8 kg)   SpO2 96%   BMI 31.96 kg/m   Urine- Positve nitrites        Assessment & Plan:   Stacey Reyes in today with chief complaint of Hematuria   1. Hematuria, unspecified type - Urinalysis, Complete - Urine Culture  2. Acute cystitis with hematuria Take medication as prescribe Cotton underwear Take shower not bath Cranberry juice, yogurt Force fluids AZO over the counter X2 days Culture pending RTO prn   3. Vagina, candidiasis - fluconazole (DIFLUCAN) 150 MG tablet; Take 1 tablet (150 mg total) by mouth every three (3) days as needed.  Dispense: 3 tablet; Refill: 1  The above assessment and management plan was discussed with the patient. The patient verbalized understanding of and has agreed to the management plan. Patient is aware to call the clinic if symptoms persist or worsen. Patient is  aware when to return to the clinic for a follow-up visit. Patient educated on when it is appropriate to go to the emergency department.   Mary-Eddith Hassell Done, FNP

## 2022-05-21 NOTE — Patient Instructions (Signed)
Urinary Tract Infection, Adult A urinary tract infection (UTI) is an infection of any part of the urinary tract. The urinary tract includes: The kidneys. The ureters. The bladder. The urethra. These organs make, store, and get rid of pee (urine) in the body. What are the causes? This infection is caused by germs (bacteria) in your genital area. These germs grow and cause swelling (inflammation) of your urinary tract. What increases the risk? The following factors may make you more likely to develop this condition: Using a small, thin tube (catheter) to drain pee. Not being able to control when you pee or poop (incontinence). Being female. If you are female, these things can increase the risk: Using these methods to prevent pregnancy: A medicine that kills sperm (spermicide). A device that blocks sperm (diaphragm). Having low levels of a female hormone (estrogen). Being pregnant. You are more likely to develop this condition if: You have genes that add to your risk. You are sexually active. You take antibiotic medicines. You have trouble peeing because of: A prostate that is bigger than normal, if you are female. A blockage in the part of your body that drains pee from the bladder. A kidney stone. A nerve condition that affects your bladder. Not getting enough to drink. Not peeing often enough. You have other conditions, such as: Diabetes. A weak disease-fighting system (immune system). Sickle cell disease. Gout. Injury of the spine. What are the signs or symptoms? Symptoms of this condition include: Needing to pee right away. Peeing small amounts often. Pain or burning when peeing. Blood in the pee. Pee that smells bad or not like normal. Trouble peeing. Pee that is cloudy. Fluid coming from the vagina, if you are female. Pain in the belly or lower back. Other symptoms include: Vomiting. Not feeling hungry. Feeling mixed up (confused). This may be the first symptom in  older adults. Being tired and grouchy (irritable). A fever. Watery poop (diarrhea). How is this treated? Taking antibiotic medicine. Taking other medicines. Drinking enough water. In some cases, you may need to see a specialist. Follow these instructions at home:  Medicines Take over-the-counter and prescription medicines only as told by your doctor. If you were prescribed an antibiotic medicine, take it as told by your doctor. Do not stop taking it even if you start to feel better. General instructions Make sure you: Pee until your bladder is empty. Do not hold pee for a long time. Empty your bladder after sex. Wipe from front to back after peeing or pooping if you are a female. Use each tissue one time when you wipe. Drink enough fluid to keep your pee pale yellow. Keep all follow-up visits. Contact a doctor if: You do not get better after 1-2 days. Your symptoms go away and then come back. Get help right away if: You have very bad back pain. You have very bad pain in your lower belly. You have a fever. You have chills. You feeling like you will vomit or you vomit. Summary A urinary tract infection (UTI) is an infection of any part of the urinary tract. This condition is caused by germs in your genital area. There are many risk factors for a UTI. Treatment includes antibiotic medicines. Drink enough fluid to keep your pee pale yellow. This information is not intended to replace advice given to you by your health care provider. Make sure you discuss any questions you have with your health care provider. Document Revised: 10/22/2019 Document Reviewed: 10/22/2019 Elsevier Patient Education    2023 Elsevier Inc.  

## 2022-05-21 NOTE — Addendum Note (Signed)
Addended by: Rolena Infante on: 05/21/2022 03:58 PM   Modules accepted: Orders

## 2022-05-22 LAB — CBC WITH DIFFERENTIAL/PLATELET
Basophils Absolute: 0.1 10*3/uL (ref 0.0–0.2)
Basos: 1 %
EOS (ABSOLUTE): 0.4 10*3/uL (ref 0.0–0.4)
Eos: 4 %
Hematocrit: 39.4 % (ref 34.0–46.6)
Hemoglobin: 13.5 g/dL (ref 11.1–15.9)
Immature Grans (Abs): 0 10*3/uL (ref 0.0–0.1)
Immature Granulocytes: 0 %
Lymphocytes Absolute: 2.1 10*3/uL (ref 0.7–3.1)
Lymphs: 25 %
MCH: 30.8 pg (ref 26.6–33.0)
MCHC: 34.3 g/dL (ref 31.5–35.7)
MCV: 90 fL (ref 79–97)
Monocytes Absolute: 0.8 10*3/uL (ref 0.1–0.9)
Monocytes: 9 %
Neutrophils Absolute: 5.3 10*3/uL (ref 1.4–7.0)
Neutrophils: 61 %
Platelets: 249 10*3/uL (ref 150–450)
RBC: 4.39 x10E6/uL (ref 3.77–5.28)
RDW: 12.4 % (ref 11.7–15.4)
WBC: 8.6 10*3/uL (ref 3.4–10.8)

## 2022-05-23 LAB — URINE CULTURE

## 2022-05-30 ENCOUNTER — Telehealth: Payer: Self-pay | Admitting: Family Medicine

## 2022-05-30 ENCOUNTER — Other Ambulatory Visit: Payer: Self-pay

## 2022-05-30 ENCOUNTER — Other Ambulatory Visit: Payer: Self-pay | Admitting: Nurse Practitioner

## 2022-05-30 DIAGNOSIS — D500 Iron deficiency anemia secondary to blood loss (chronic): Secondary | ICD-10-CM

## 2022-05-30 NOTE — Telephone Encounter (Signed)
Patient was dx with UTI and was given keflex. She may alos have yeast even though did notshow up in urine- I will send in diflucan

## 2022-05-30 NOTE — Telephone Encounter (Signed)
Patient was seen on 2/27 for a yeast infection and was given cephALEXin (KEFLEX) 500 MG capsule she ran out of medication on 3/6 and said she is still having burning. She would like to know if something else can be called in.

## 2022-05-31 ENCOUNTER — Inpatient Hospital Stay: Payer: Medicare Other | Attending: Physician Assistant

## 2022-05-31 DIAGNOSIS — D500 Iron deficiency anemia secondary to blood loss (chronic): Secondary | ICD-10-CM | POA: Diagnosis not present

## 2022-05-31 DIAGNOSIS — R195 Other fecal abnormalities: Secondary | ICD-10-CM | POA: Diagnosis not present

## 2022-05-31 DIAGNOSIS — E538 Deficiency of other specified B group vitamins: Secondary | ICD-10-CM | POA: Insufficient documentation

## 2022-05-31 LAB — CBC WITH DIFFERENTIAL/PLATELET
Abs Immature Granulocytes: 0.01 10*3/uL (ref 0.00–0.07)
Basophils Absolute: 0.1 10*3/uL (ref 0.0–0.1)
Basophils Relative: 1 %
Eosinophils Absolute: 0.4 10*3/uL (ref 0.0–0.5)
Eosinophils Relative: 5 %
HCT: 38.9 % (ref 36.0–46.0)
Hemoglobin: 13.2 g/dL (ref 12.0–15.0)
Immature Granulocytes: 0 %
Lymphocytes Relative: 22 %
Lymphs Abs: 1.9 10*3/uL (ref 0.7–4.0)
MCH: 30.5 pg (ref 26.0–34.0)
MCHC: 33.9 g/dL (ref 30.0–36.0)
MCV: 89.8 fL (ref 80.0–100.0)
Monocytes Absolute: 0.7 10*3/uL (ref 0.1–1.0)
Monocytes Relative: 9 %
Neutro Abs: 5.3 10*3/uL (ref 1.7–7.7)
Neutrophils Relative %: 63 %
Platelets: 221 10*3/uL (ref 150–400)
RBC: 4.33 MIL/uL (ref 3.87–5.11)
RDW: 12.7 % (ref 11.5–15.5)
WBC: 8.3 10*3/uL (ref 4.0–10.5)
nRBC: 0 % (ref 0.0–0.2)

## 2022-05-31 LAB — COMPREHENSIVE METABOLIC PANEL
ALT: 18 U/L (ref 0–44)
AST: 24 U/L (ref 15–41)
Albumin: 3.9 g/dL (ref 3.5–5.0)
Alkaline Phosphatase: 45 U/L (ref 38–126)
Anion gap: 8 (ref 5–15)
BUN: 10 mg/dL (ref 8–23)
CO2: 25 mmol/L (ref 22–32)
Calcium: 8.7 mg/dL — ABNORMAL LOW (ref 8.9–10.3)
Chloride: 103 mmol/L (ref 98–111)
Creatinine, Ser: 0.66 mg/dL (ref 0.44–1.00)
GFR, Estimated: >60 mL/min (ref >60)
Glucose, Bld: 106 mg/dL — ABNORMAL HIGH (ref 70–99)
Potassium: 3.5 mmol/L (ref 3.5–5.1)
Sodium: 136 mmol/L (ref 135–145)
Total Bilirubin: 0.6 mg/dL (ref 0.3–1.2)
Total Protein: 6.5 g/dL (ref 6.5–8.1)

## 2022-05-31 LAB — IRON AND TIBC
Iron: 81 ug/dL (ref 28–170)
Saturation Ratios: 25 % (ref 10.4–31.8)
TIBC: 330 ug/dL (ref 250–450)
UIBC: 249 ug/dL

## 2022-05-31 LAB — FERRITIN: Ferritin: 98 ng/mL (ref 11–307)

## 2022-05-31 NOTE — Telephone Encounter (Signed)
Lmtcb.

## 2022-05-31 NOTE — Telephone Encounter (Signed)
Patient aware and verbalizes understanding. 

## 2022-06-07 ENCOUNTER — Inpatient Hospital Stay (HOSPITAL_BASED_OUTPATIENT_CLINIC_OR_DEPARTMENT_OTHER): Payer: Medicare Other | Admitting: Physician Assistant

## 2022-06-07 DIAGNOSIS — E538 Deficiency of other specified B group vitamins: Secondary | ICD-10-CM

## 2022-06-07 DIAGNOSIS — D500 Iron deficiency anemia secondary to blood loss (chronic): Secondary | ICD-10-CM

## 2022-06-07 NOTE — Progress Notes (Signed)
VIRTUAL VISIT via [place]   I connected with Stacey Reyes  on 06/07/22 at 3:30 PM by telephone and verified that I am speaking with the correct person using two identifiers.  Location: Patient: Home Provider: [facility]   I discussed the limitations, risks, security and privacy concerns of performing an evaluation and management service by telephone and the availability of in person appointments. I also discussed with the patient that there may be a patient responsible charge related to this service. The patient expressed understanding and agreed to proceed.  REASON FOR VISIT:  Follow-up for iron deficiency   CURRENT THERAPY: Intermittent IV iron infusions and blood transfusions  INTERVAL HISTORY:  Stacey Reyes is contacted today for follow-up of her iron deficiency anemia.  She was last evaluated via telemedicine visit by Beckey Rutter, NP on 03/29/2022.  She has not had any hospital stays or blood transfusions since her GI bleed in September 2023.  At today's visit, she reports that she is feeling fairly well.  She has not noticed any recurrent melena, hematochezia, or other evidence of blood loss.  She denies any significant fatigue.  No pica, lightheadedness, or syncope.  She reports 80% energy and 100% appetite.  REVIEW OF SYSTEMS:   Review of Systems  Constitutional:  Negative for chills, diaphoresis, fever, malaise/fatigue and weight loss.  Respiratory:  Negative for cough and shortness of breath.   Cardiovascular:  Negative for chest pain and palpitations.  Gastrointestinal:  Positive for diarrhea, nausea and vomiting. Negative for abdominal pain, blood in stool and melena.  Neurological:  Negative for dizziness and headaches.     PHYSICAL EXAM: (per limitations of virtual telephone visit)  The patient is alert and oriented x 3, exhibiting adequate mentation, good mood, and ability to speak in full sentences and execute  sound judgement.  ASSESSMENT & PLAN:  1.  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  No improvement despite taking iron tablet twice daily - Patient has had multiple hospitalizations within the past year for acute blood loss anemia, is followed closely by gastroenterology - She had two hospitalization in September 2022, and received a total of 8 units PRBC during these hospital stays.  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 - 3 hospitalizations in June/July 2023, total of 4 units PRBC during his hospital stays.   - [facility] (12/18/2021 - 12/20/2021): Acute blood loss anemia with extreme fatigue and dyspnea on exertion.  She was found to have Hgb 7.8, which had dropped from 11.2 in the course of 1 month.  FOBT was positive.  She received 2 units PRBC during hospitalization.  EGD (12/19/2021) showed Dieulafoy lesion in her stomach.  Hgb at the time of discharge was 9.0. - Most recent IV Feraheme on 10/22/2021 and 10/29/2021 - She has not had any evidence of recurrent GI bleeding since her most recent hospitalization in September 2023. - Most recent labs (05/31/2022): Hgb 13.2/MCV 89.8, ferritin 98, iron saturation 25%. - PLAN: No indication for IV iron at this time.  Continue daily iron supplement. - Continue as needed blood checks with PCP. - Repeat labs and RTC in 3 months with phone visit   - Patient is aware of alarm symptoms that would prompt immediate medical attention.   2.  B12 deficiency - She is taking B12 tablet 1000 mcg daily - Most recent labs (12/18/2021): Vitamin B12 normal at 341 with normal MMA - PLAN: Continue B12 supplement.  We will recheck levels annually (next in September 2024)   PLAN SUMMARY: >> Labs in 3 months = CBC/D, CMP, ferritin, iron/TIBC >> OFFICE visit in 3 months      I discussed the assessment and treatment plan with the patient. The patient was provided an opportunity to ask questions and all were answered. The patient agreed with the plan and demonstrated an understanding of the instructions.   The patient was advised to call back or seek an in-person evaluation if the symptoms worsen or if the condition fails to improve as anticipated.  I provided 18 minutes of non-face-to-face time during this encounter.   Harriett Rush, PA-C 06/07/2022 5:36 PM

## 2022-06-10 ENCOUNTER — Other Ambulatory Visit: Payer: Self-pay

## 2022-06-10 DIAGNOSIS — D500 Iron deficiency anemia secondary to blood loss (chronic): Secondary | ICD-10-CM

## 2022-06-10 DIAGNOSIS — D649 Anemia, unspecified: Secondary | ICD-10-CM

## 2022-06-10 DIAGNOSIS — E538 Deficiency of other specified B group vitamins: Secondary | ICD-10-CM

## 2022-06-10 DIAGNOSIS — D509 Iron deficiency anemia, unspecified: Secondary | ICD-10-CM

## 2022-06-10 DIAGNOSIS — E559 Vitamin D deficiency, unspecified: Secondary | ICD-10-CM

## 2022-06-11 ENCOUNTER — Ambulatory Visit (INDEPENDENT_AMBULATORY_CARE_PROVIDER_SITE_OTHER): Payer: Medicare Other | Admitting: Family Medicine

## 2022-06-11 ENCOUNTER — Encounter: Payer: Self-pay | Admitting: Family Medicine

## 2022-06-11 VITALS — BP 118/82 | HR 96 | Temp 98.6°F | Ht 66.0 in | Wt 196.0 lb

## 2022-06-11 DIAGNOSIS — R3989 Other symptoms and signs involving the genitourinary system: Secondary | ICD-10-CM | POA: Diagnosis not present

## 2022-06-11 DIAGNOSIS — N760 Acute vaginitis: Secondary | ICD-10-CM

## 2022-06-11 DIAGNOSIS — N6092 Unspecified benign mammary dysplasia of left breast: Secondary | ICD-10-CM | POA: Diagnosis not present

## 2022-06-11 MED ORDER — CEPHALEXIN 500 MG PO CAPS: 500.0000 mg | ORAL_CAPSULE | Freq: Two times a day (BID) | ORAL | 0 refills | Status: DC

## 2022-06-11 NOTE — Patient Instructions (Signed)
Atypical Hyperplasia of the Breast Atypical hyperplasia of the breast is a condition where some cells in the breast are unusual or abnormal. The cells can be found in the small tubes (ducts) or small lobes (lobules) in the breast. Compared with normal breast cells, the cells seen in atypical hyperplasia: Grow faster than normal breast cells. Are organized in an unusual way. Grow in larger numbers than normal breast cells (overproduction). Are unusual in size and shape. Atypical hyperplasia is not a form of breast cancer. However, it may: Make you more likely to develop breast cancer. Turn into cancer (is precancerous), if it is not treated. What are the causes? The cause of this condition is not known. What increases the risk? The following factors may make you more likely to develop this condition: Age. Your risk increases as you get older. Certain genes passed from parent to child (inherited), or having a family history or your own history of breast cancer. Having had radiation treatments to your breasts or chest area before. Using or having used hormones, such as estrogen. Starting your menstrual period (menstruation) before the age of 51, or going through menopause after age 61. Giving birth to your first child after age 45, or never having given birth. Never having breastfed, if you have given birth. Other factors that may increase your risk include: Being overweight or not being physically active. Having more than one alcoholic drink a day. What are the signs or symptoms? There are no symptoms of this condition. How is this diagnosed?  This condition is usually found during a routine X-ray study of the breasts (mammogram). If a mammogram shows something concerning, a biopsy is done. This means that a small sample of breast tissue is removed and cells in the tissue are looked at under a microscope. Most often, breast cells can be removed through a needle. Sometimes, a small cut  (incision) will need to be made in the breast to remove a sample of breast tissue. The biopsy can help diagnose atypical hyperplasia. How is this treated? This condition may be treated with: Monitoring breast health more often. This is done with: Clinical breast exams. Mammograms. Ultrasounds. MRIs. Chemoprevention. These are medicines to keep the abnormal cells from spreading and becoming cancer. A lumpectomy, also called breast-conserving surgery. This is when the area of abnormal cells is removed, along with some of the normal tissue in the area. A simple mastectomy. This is the removal of breast tissue, the nipple, and the circle of colored tissue around the nipple (areola). Sometimes, one or more lymph nodes from under the arm are also removed. A preventive mastectomy. This is when both breasts are removed. This is usually done only if there is a very high risk of developing breast cancer. Follow these instructions at home: Breast health Check your breasts well after every menstrual period. If you do not have menstrual periods, check your breasts on the first day of every month. Feel for changes in your breasts, such as: More tenderness. A new growth. A change in size or shape. A change in an existing lump. Follow your health care provider's instructions on how often you should have a mammogram or other imaging tests. Alcohol use Do not drink alcohol if: Your health care provider tells you not to drink. You are pregnant, may be pregnant, or are planning to become pregnant. If you drink alcohol: Limit how much you have to 0-1 drink a day. Know how much alcohol is in your drink. In the  [place] one drink equals one 12 oz bottle of beer (355 mL), one 5 oz glass of wine (148 mL), or one 1 oz glass of hard liquor (44 mL). General instructions Take over-the-counter and prescription medicines only as told by your health care provider. Eat a healthy diet. This includes low-fat dairy products,  lean meats, and fiber-rich foods, such as fruits, vegetables, and whole grains. To get more fiber: Make sure half your plate is filled with fruits or vegetables. Choose high-fiber foods such as whole-grain breads and cereals. Do not use any products that contain nicotine or tobacco. These products include cigarettes, chewing tobacco, and vaping devices, such as e-cigarettes. If you need help quitting, ask your health care provider. Keep all follow-up visits. This is important. Contact a health care provider if you have: A change in shape or size of your breasts or nipples. A change in the skin of your breast or nipples, such as a reddened, scaly area or tiny bumps like dimples. Unusual discharge from your nipples. A lump or thick area that was not there before. Pain in your breasts. Get help right away if you have: Trouble breathing. Chest pain. Redness on your breast and the redness is spreading. Summary Atypical hyperplasia of the breast is a condition where some cells in the breast are unusual or abnormal. Atypical hyperplasia is not a form of breast cancer. However, it may make you more likely to develop breast cancer. This condition is usually found during a routine X-ray study of the breasts (mammogram). Check your breasts well after every menstrual period. If you do not have menstrual periods, check your breasts on the first day of every month. Tell your health care provider if you notice any changes in your breasts. This information is not intended to replace advice given to you by your health care provider. Make sure you discuss any questions you have with your health care provider. Document Revised: 03/08/2021 Document Reviewed: 03/08/2021 Elsevier Patient Education  [place].

## 2022-06-11 NOTE — Progress Notes (Signed)
Subjective: CC: Suspected UTI I PCP: [name], DO RQ:[id number] Stacey Reyes is a 77 y.o. female presenting to clinic today for:  1.  Recent UTI Patient reports ongoing pelvic pain, vaginitis.  She reports itching but no appreciable discharge, odors or hematuria.  No flank pain.  Keflex did help alleviate some of the symptoms but she never did use the Diflucan that was given  2.  Abnormal mammogram Patient noted to have atypical ductal hyperplasia.  Does not report any unplanned weight loss, night sweats.  Has not gotten appointment for breast surgery yet   ROS: Per HPI  Allergies  Allergen Reactions   Sulfa Antibiotics Rash   Past Medical History:  Diagnosis Date   Anemia    Blood transfusion without reported diagnosis    Cataract    removed bilateral   Chronic cystitis    Diverticulitis    Heart murmur    Hyperlipidemia    Hypertension    Personal history of colonic polyps-adenomas 01/07/2012   2009 - 2 diminutive adenomas (prior polyps also) 01/07/2012 - 2 diminutive adenomas      Current Outpatient Medications:    acetaminophen (TYLENOL) 325 MG tablet, Take 325 mg by mouth every 6 (six) hours as needed., Disp: , Rfl:    Ascorbic Acid (VITAMIN C PO), Take 1 tablet by mouth daily., Disp: , Rfl:    bisoprolol (ZEBETA) 5 MG tablet, TAKE ONE-HALF TABLET BY MOUTH  DAILY, Disp: 50 tablet, Rfl: 0   CALCIUM PO, Take 1 tablet by mouth daily., Disp: , Rfl:    Cholecalciferol (VITAMIN D-3 PO), Take 1 capsule by mouth daily., Disp: , Rfl:    COPPER PO, Take by mouth., Disp: , Rfl:    cyanocobalamin (CYANOCOBALAMIN) 500 MCG tablet, Take 500 mcg by mouth daily., Disp: , Rfl:    ferrous sulfate ([employer] IRON) 325 (65 FE) MG tablet, TAKE 1 TABLET BY MOUTH EVERY DAY, Disp: 90 tablet, Rfl: 3   fluconazole (DIFLUCAN) 150 MG tablet, Take 1 tablet (150 mg total) by mouth every three (3) days as needed., Disp: 3 tablet, Rfl: 1   hydrochlorothiazide (MICROZIDE) 12.5 MG capsule, Take 12.5  mg by mouth daily., Disp: , Rfl:    levocetirizine (XYZAL) 5 MG tablet, TAKE 1 TABLET (5 MG TOTAL) BY MOUTH AT BEDTIME AS NEEDED FOR ALLERGIES (DRAINAGE)., Disp: 90 tablet, Rfl: 1   MAGNESIUM PO, Take 1 tablet by mouth daily., Disp: , Rfl:    pantoprazole (PROTONIX) 40 MG tablet, Take 1 tablet (40 mg total) by mouth 2 (two) times daily., Disp: 180 tablet, Rfl: 0   rosuvastatin (CRESTOR) 10 MG tablet, TAKE 1 TABLET BY MOUTH AT  BEDTIME, Disp: 100 tablet, Rfl: 2   traZODone (DESYREL) 50 MG tablet, TAKE 1 TABLET BY MOUTH AT BEDTIME AS NEEDED FOR SLEEP., Disp: 90 tablet, Rfl: 3   Vitamin D, Ergocalciferol, (DRISDOL) 1.25 MG (50000 UNIT) CAPS capsule, TAKE 1 CAPSULE BY MOUTH  ONCE WEEKLY, Disp: 15 capsule, Rfl: 2   VITAMIN E PO, Take 1 tablet by mouth daily., Disp: , Rfl:    cephALEXin (KEFLEX) 500 MG capsule, Take 1 capsule (500 mg total) by mouth 2 (two) times daily., Disp: 14 capsule, Rfl: 0 Social History   Socioeconomic History   Marital status: Married    Spouse name: Not on file   Number of children: 1   Years of education: Not on file   Highest education level: Not on file  Occupational History   Occupation: retired  Tobacco Use   Smoking status: Former    Types: Cigarettes    Quit date: 12/23/1984    Years since quitting: 37.4   Smokeless tobacco: Never  Vaping Use   Vaping Use: Never used  Substance and Sexual Activity   Alcohol use: Yes    Comment: wine occasionally   Drug use: No   Sexual activity: Not on file  Other Topics Concern   Not on file  Social History Narrative   Patient is married and retired and has 1 grown child   Social Determinants of Radio broadcast assistant Strain: [place]  (03/28/2022)   Overall Financial Resource Strain (CARDIA)    Difficulty of Paying Living Expenses: Not hard at all  Food Insecurity: No Food Insecurity (03/28/2022)   Hunger Vital Sign    Worried About Running Out of Food in the Last Year: Never true    Ran Out of Food in the  Last Year: Never true  Transportation Needs: No Transportation Needs (03/28/2022)   PRAPARE - Hydrologist (Medical): No    Lack of Transportation (Non-Medical): No  Physical Activity: Insufficiently Active (03/28/2022)   Exercise Vital Sign    Days of Exercise per Week: 3 days    Minutes of Exercise per Session: 30 min  Stress: No Stress Concern Present (03/28/2022)   [place]    Feeling of Stress : Not at all  Social Connections: Moderately Isolated (03/28/2022)   Social Connection and Isolation Panel [NHANES]    Frequency of Communication with Friends and Family: More than three times a week    Frequency of Social Gatherings with Friends and Family: More than three times a week    Attends Religious Services: Never    Marine scientist or Organizations: No    Attends Archivist Meetings: Never    Marital Status: Married  Human resources officer Violence: Not At Risk (03/28/2022)   Humiliation, Afraid, Rape, and Kick questionnaire    Fear of Current or Ex-Partner: No    Emotionally Abused: No    Physically Abused: No    Sexually Abused: No   Family History  Problem Relation Age of Onset   Colon cancer Mother 91       80's   Breast cancer Sister    Asthma Brother    Colon polyps Neg Hx    Esophageal cancer Neg Hx    Rectal cancer Neg Hx    Stomach cancer Neg Hx     Objective: Office vital signs reviewed. BP 118/82   Pulse 96   Temp 98.6 F (37 C)   Ht 5\' 6"  (1.676 m)   Wt 196 lb (88.9 kg)   SpO2 96%   BMI 31.64 kg/m   Physical Examination:  General: Awake, alert, well nourished, No acute distress HEENT: sclera white, MMM Cardio: regular rate and rhythm, S1S2 heard, + murmurs appreciated Pulm: clear to auscultation bilaterally, no wheezes, rhonchi or rales; normal work of breathing on room air    Assessment/ Plan: 77 y.o. female   Atypical ductal hyperplasia of  left breast - Plan: Ambulatory referral to General Surgery  Suspected UTI - Plan: Urine Culture, Urinalysis, cephALEXin (KEFLEX) 500 MG capsule  Acute vaginitis  Referral to general surgery/breast surgery placed.  Patient prefers [place] but will be glad to go to [facility] if needed  Check UA.  Keflex renewed.  Encouraged use of Diflucan.  We  discussed that if symptoms do not improve with above therapies she is to come back in for pelvic exam.  Differential diagnosis includes lichen sclerosis of the vulva  Orders Placed This Encounter  Procedures   Urine Culture   Urinalysis   Ambulatory referral to General Surgery    Referral Priority:   Routine    Referral Type:   Surgical    Referral Reason:   Specialty Services Required    Requested Specialty:   General Surgery    Number of Visits Requested:   1   Meds ordered this encounter  Medications   cephALEXin (KEFLEX) 500 MG capsule    Sig: Take 1 capsule (500 mg total) by mouth 2 (two) times daily.    Dispense:  14 capsule    Refill:  Katy, DO [place] [phone]

## 2022-06-12 LAB — URINALYSIS
Bilirubin, UA: NEGATIVE
Glucose, UA: NEGATIVE
Ketones, UA: NEGATIVE
Nitrite, UA: NEGATIVE
Protein,UA: NEGATIVE
RBC, UA: NEGATIVE
Specific Gravity, UA: 1.02 (ref 1.005–1.030)
Urobilinogen, Ur: 0.2 mg/dL (ref 0.2–1.0)
pH, UA: 7 (ref 5.0–7.5)

## 2022-06-13 LAB — URINE CULTURE

## 2022-06-20 ENCOUNTER — Telehealth: Payer: Self-pay

## 2022-06-20 NOTE — Telephone Encounter (Signed)
Patient calling to follow up on referral.  Please call with update.

## 2022-07-01 ENCOUNTER — Encounter: Payer: Self-pay | Admitting: Nurse Practitioner

## 2022-07-01 ENCOUNTER — Ambulatory Visit (INDEPENDENT_AMBULATORY_CARE_PROVIDER_SITE_OTHER): Payer: Medicare Other | Admitting: Nurse Practitioner

## 2022-07-01 VITALS — BP 166/84 | HR 70 | Temp 97.0°F | Resp 20 | Ht 66.0 in | Wt 196.0 lb

## 2022-07-01 DIAGNOSIS — R300 Dysuria: Secondary | ICD-10-CM | POA: Diagnosis not present

## 2022-07-01 DIAGNOSIS — N3001 Acute cystitis with hematuria: Secondary | ICD-10-CM

## 2022-07-01 LAB — MICROSCOPIC EXAMINATION
Epithelial Cells (non renal): NONE SEEN /hpf (ref 0–10)
Renal Epithel, UA: NONE SEEN /hpf
WBC, UA: >30 /hpf — AB (ref 0–5)

## 2022-07-01 LAB — URINALYSIS, COMPLETE
Bilirubin, UA: NEGATIVE
Glucose, UA: NEGATIVE
Ketones, UA: NEGATIVE
Nitrite, UA: NEGATIVE
Specific Gravity, UA: 1.02 (ref 1.005–1.030)
Urobilinogen, Ur: 0.2 mg/dL (ref 0.2–1.0)
pH, UA: 7 (ref 5.0–7.5)

## 2022-07-01 MED ORDER — PHENAZOPYRIDINE HCL 100 MG PO TABS
100.0000 mg | ORAL_TABLET | Freq: Three times a day (TID) | ORAL | 0 refills | Status: DC | PRN
Start: 2022-07-01 — End: 2022-07-05

## 2022-07-01 MED ORDER — DOXYCYCLINE HYCLATE 100 MG PO TABS: 100.0000 mg | ORAL_TABLET | Freq: Two times a day (BID) | ORAL | 0 refills | Status: DC

## 2022-07-01 NOTE — Patient Instructions (Signed)
Take medication as prescribe Cotton underwear Take shower not bath Cranberry juice, yogurt Force fluids AZO over the counter X2 days Culture pending RTO prn  

## 2022-07-01 NOTE — Progress Notes (Signed)
Subjective:    Patient ID: Stacey Reyes, female    DOB: Nov 17, 1945, 77 y.o.   MRN: [id number]   Chief Complaint: Dysuria   Dysuria  This is a new problem. The current episode started in the past 7 days. The problem occurs intermittently. The problem has been gradually worsening. The quality of the pain is described as burning. The pain is at a severity of 10/10. The pain is mild. There has been no fever. She is Not sexually active. There is No history of pyelonephritis. Associated symptoms include flank pain, frequency, hesitancy and urgency. She has tried nothing for the symptoms. The treatment provided moderate relief.    Patient Active Problem List   Diagnosis Date Noted   GI bleed 12/18/2021   GERD (gastroesophageal reflux disease) 12/18/2021   Gastrointestinal hemorrhage    Hypocalcemia 10/06/2021   Obesity (BMI 30-39.9) 10/06/2021   History of GI bleed 09/25/2021   Hypokalemia 09/16/2021   Elevated troponin 10/20/2020   Symptomatic anemia 10/19/2020   Morning headache 03/08/2020   Hypertrophic cardiomyopathy 01/17/2020   Iron deficiency anemia due to chronic blood loss 12/21/2019   Need for immunization against influenza 12/14/2019   Acute blood loss anemia 11/22/2019   Educated about COVID-19 virus infection 07/05/2019   Dyspnea on exertion 07/05/2019   B12 deficiency 03/25/2019   Absolute anemia 03/24/2019   Osteopenia after menopause 04/22/2018   Cardiac murmur due to mitral valve disorder 03/03/2018   Heart murmur 03/03/2018   LVH (left ventricular hypertrophy) 11/20/2015   Arrhythmia 10/11/2015   Hypertension, essential 02/24/2015   Mixed hyperlipidemia 02/24/2015   History of colonic polyps 01/07/2012       Review of Systems  Genitourinary:  Positive for dysuria, flank pain, frequency, hesitancy and urgency.       Objective:   Physical Exam Vitals reviewed.  Constitutional:      Appearance: Normal appearance.  Cardiovascular:     Rate and  Rhythm: Normal rate and regular rhythm.     Heart sounds: Normal heart sounds.  Pulmonary:     Effort: Pulmonary effort is normal.     Breath sounds: Normal breath sounds.  Abdominal:     Tenderness: There is abdominal tenderness (suprapubic tenderness). There is no right CVA tenderness or left CVA tenderness.  Neurological:     General: No focal deficit present.     Mental Status: She is alert and oriented to person, place, and time.  Psychiatric:        Mood and Affect: Mood normal.        Behavior: Behavior normal.     BP (!) 166/84   Pulse 70   Temp (!) 97 F (36.1 C) (Temporal)   Resp 20   Ht 5\' 6"  (1.676 m)   Wt 196 lb (88.9 kg)   SpO2 96%   BMI 31.64 kg/m        Assessment & Plan:   Stacey Reyes in today with chief complaint of Dysuria   1. Dysuria - Urinalysis, Complete - Urine Culture  2. Acute cystitis with hematuria Take medication as prescribe Cotton underwear Take shower not bath Cranberry juice, yogurt Force fluids AZO over the counter X2 days Culture pending RTO prn  - doxycycline (VIBRA-TABS) 100 MG tablet; Take 1 tablet (100 mg total) by mouth 2 (two) times daily. 1 po bid  Dispense: 20 tablet; Refill: 0 - phenazopyridine (PYRIDIUM) 100 MG tablet; Take 1 tablet (100 mg total) by mouth 3 (three) times daily  as needed for pain.  Dispense: 10 tablet; Refill: 0    The above assessment and management plan was discussed with the patient. The patient verbalized understanding of and has agreed to the management plan. Patient is aware to call the clinic if symptoms persist or worsen. Patient is aware when to return to the clinic for a follow-up visit. Patient educated on when it is appropriate to go to the emergency department.   Mary-Anshu Daphine Deutscher, FNP

## 2022-07-03 ENCOUNTER — Ambulatory Visit: Payer: Medicare Other | Admitting: Cardiology

## 2022-07-04 LAB — URINE CULTURE

## 2022-07-04 MED ORDER — CIPROFLOXACIN HCL 500 MG PO TABS: 500.0000 mg | ORAL_TABLET | Freq: Two times a day (BID) | ORAL | 0 refills | Status: DC

## 2022-07-04 NOTE — Addendum Note (Signed)
Addended by: Bennie Pierini on: 07/04/2022 04:34 PM   Modules accepted: Orders

## 2022-07-05 ENCOUNTER — Other Ambulatory Visit: Payer: Self-pay

## 2022-07-05 DIAGNOSIS — N3001 Acute cystitis with hematuria: Secondary | ICD-10-CM

## 2022-07-05 MED ORDER — PHENAZOPYRIDINE HCL 100 MG PO TABS
100.0000 mg | ORAL_TABLET | Freq: Three times a day (TID) | ORAL | 0 refills | Status: DC | PRN
Start: 2022-07-05 — End: 2022-07-17

## 2022-07-10 ENCOUNTER — Telehealth: Payer: Self-pay | Admitting: Family Medicine

## 2022-07-10 NOTE — Telephone Encounter (Signed)
Thank you for this update 

## 2022-07-11 ENCOUNTER — Encounter: Payer: Self-pay | Admitting: Internal Medicine

## 2022-07-16 NOTE — Progress Notes (Unsigned)
  Cardiology Office Note:   Date:  07/17/2022  ID:  KEYSHA Reyes, DOB 1945/12/10, MRN [id number]  History of Present Illness:   Stacey Reyes is a 77 y.o. female who was referred by Raliegh Ip, DO for evaluation of a heart murmur .  She saw Dr. Garnette Scheuermann for evaluation of palpitations.  She was seen in 2017 with SVT or afib suspected but not detected on event monitor.   Echo EF was normal in 2017.  I saw her with a murmur that was thought to be more prominent.   Echo EF was 75% with severe LVH.   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  She was admitted to the hospital in 2023.  She was found to have profound anemia. She was found to have severe B12 deficiency.  She required transfusions.  Of note I did send her for a PYP scan.  I had wanted to get an MRI but cost was prohibitive at that time.  She subsequently had this and was found to have asymmetric septal hypertrophy.  There was no evidence of amyloid.   She was admitted with GI blood loss with capsule endoscopy indicating possible lymphangiectasias .  She has had clipping and says that the bleeding is gone and her hemoglobin has normalized and been stable.  Since she was last seen she has had no new cardiovascular complaints.  Her hemoglobin has been stable.  She is working in her garden.  She shags.  The patient denies any new symptoms such as chest discomfort, neck or arm discomfort. There has been no new shortness of breath, PND or orthopnea. There have been no reported palpitations, presyncope or syncope.  ROS: As stated in the HPI and negative for all other systems.  Studies Reviewed:    EKG:  NA   Risk Assessment/Calculations:          Physical Exam:   VS:  BP 110/80   Pulse 76   Ht  (1.676 m)   Wt 198 lb (89.8 kg)   BMI 31.96 kg/m    Wt Readings from Last 3 Encounters:  07/17/22 198 lb (89.8 kg)  07/01/22 196 lb (88.9 kg)  06/11/22 196 lb (88.9 kg)     GEN: Well nourished, well developed in no acute  distress NECK: No JVD; No carotid bruits CARDIAC: RRR, 3 out of 6 apical systolic murmur mid peaking and radiating of the aortic outflow tract and increasing with a strain phase of Valsalva, no diastolic murmurs, rubs, gallops RESPIRATORY:  Clear to auscultation without rales, wheezing or rhonchi  ABDOMEN: Soft, non-tender, non-distended EXTREMITIES:  No edema; No deformity   ASSESSMENT AND PLAN:   Dyspnea:    This has resolved since she is no longer anemic.  No change in therapy.   LVH/HCM:    She has asymmetric septal hypertrophy.   She has had genetic counseling with no identifiable gene.  Her family has been counseled about screening.  She has no symptoms and no change in therapy is necessary.  Hypertension: Patient's blood pressure is at target.  No change in therapy.         Signed, Rollene Rotunda, MD

## 2022-07-17 ENCOUNTER — Ambulatory Visit: Payer: Medicare Other | Admitting: Cardiology

## 2022-07-17 ENCOUNTER — Encounter: Payer: Self-pay | Admitting: Cardiology

## 2022-07-17 VITALS — BP 110/80 | HR 76 | Ht 66.0 in | Wt 198.0 lb

## 2022-07-17 DIAGNOSIS — R0602 Shortness of breath: Secondary | ICD-10-CM | POA: Diagnosis not present

## 2022-07-17 DIAGNOSIS — I517 Cardiomegaly: Secondary | ICD-10-CM

## 2022-07-17 DIAGNOSIS — I10 Essential (primary) hypertension: Secondary | ICD-10-CM | POA: Diagnosis not present

## 2022-07-17 NOTE — Patient Instructions (Signed)
Medication Instructions:  The current medical regimen is effective;  continue present plan and medications.  *If you need a refill on your cardiac medications before your next appointment, please call your pharmacy*  Follow-Up: At [facility], you and your health needs are our priority.  As part of our continuing mission to provide you with exceptional heart care, we have created designated Provider Care Teams.  These Care Teams include your primary Cardiologist (physician) and Advanced Practice Providers (APPs -  Physician Assistants and Nurse Practitioners) who all work together to provide you with the care you need, when you need it.  We recommend signing up for the patient portal called "MyChart".  Sign up information is provided on this After Visit Summary.  MyChart is used to connect with patients for Virtual Visits (Telemedicine).  Patients are able to view lab/test results, encounter notes, upcoming appointments, etc.  Non-urgent messages can be sent to your provider as well.   To learn more about what you can do with MyChart, go to [email or web address].    Your next appointment:   1 year(s)  Provider:   James Hochrein, MD     

## 2022-07-21 ENCOUNTER — Other Ambulatory Visit: Payer: Self-pay | Admitting: Cardiology

## 2022-07-29 ENCOUNTER — Other Ambulatory Visit: Payer: Self-pay | Admitting: General Surgery

## 2022-07-29 DIAGNOSIS — N6092 Unspecified benign mammary dysplasia of left breast: Secondary | ICD-10-CM

## 2022-08-01 ENCOUNTER — Other Ambulatory Visit: Payer: Self-pay | Admitting: General Surgery

## 2022-08-01 DIAGNOSIS — N6092 Unspecified benign mammary dysplasia of left breast: Secondary | ICD-10-CM

## 2022-08-14 NOTE — Pre-Procedure Instructions (Signed)
Surgical Instructions    Your procedure is scheduled on Thursday, May 30th.  Report to [facility] Main Entrance "A" at 10:30 A.M., then check in with the Admitting office.  Call this number if you have problems the morning of surgery:  [phone]  If you have any questions prior to your surgery date call [phone]: Open Monday-Friday 8am-4pm If you experience any cold or flu symptoms such as cough, fever, chills, shortness of breath, etc. between now and your scheduled surgery, please notify us at the above number.     Remember:  Do not eat after midnight the night before your surgery  You may drink clear liquids until 09:30 AM the morning of your surgery.   Clear liquids allowed are: Water, Non-Citrus Juices (without pulp), Carbonated Beverages, Clear Tea, Black Coffee Only (NO MILK, CREAM OR POWDERED CREAMER of any kind), and Gatorade.   Patient Instructions  The night before surgery:  No food after midnight. ONLY clear liquids after midnight  The day of surgery (if you do NOT have diabetes):  Drink ONE (1) Pre-Surgery Clear Ensure by 09:30 AM the morning of surgery. Drink in one sitting. Do not sip.  This drink was given to you during your hospital  pre-op appointment visit.  Nothing else to drink after completing the  Pre-Surgery Clear Ensure.          If you have questions, please contact your surgeon's office.     Take these medicines the morning of surgery with A SIP OF WATER  bisoprolol (ZEBETA)  pantoprazole (PROTONIX)   If needed: acetaminophen (TYLENOL)   As of today, STOP taking any Aspirin (unless otherwise instructed by your surgeon) Aleve, Naproxen, Ibuprofen, Motrin, Advil, Goody's, BC's, all herbal medications, fish oil, and all vitamins.                     Do NOT Smoke (Tobacco/Vaping) for 24 hours prior to your procedure.  If you use a CPAP at night, you may bring your mask/headgear for your overnight stay.   Contacts, glasses, piercing's,  hearing aid's, dentures or partials may not be worn into surgery, please bring cases for these belongings.    For patients admitted to the hospital, discharge time will be determined by your treatment team.   Patients discharged the day of surgery will not be allowed to drive home, and someone needs to stay with them for 24 hours.  SURGICAL WAITING ROOM VISITATION Patients having surgery or a procedure may have no more than 2 support people in the waiting area - these visitors may rotate.   Children under the age of 57 must have an adult with them who is not the patient. If the patient needs to stay at the hospital during part of their recovery, the visitor guidelines for inpatient rooms apply. Pre-op nurse will coordinate an appropriate time for 1 support person to accompany patient in pre-op.  This support person may not rotate.   Please refer to the [facility] website for the visitor guidelines for Inpatients (after your surgery is over and you are in a regular room).    Special instructions:   [place]- Preparing For Surgery  Before surgery, you can play an important role. Because skin is not sterile, your skin needs to be as free of germs as possible. You can reduce the number of germs on your skin by washing with CHG (chlorahexidine gluconate) Soap before surgery.  CHG is an antiseptic cleaner which kills germs and  bonds with the skin to continue killing germs even after washing.    Oral Hygiene is also important to reduce your risk of infection.  Remember - BRUSH YOUR TEETH THE MORNING OF SURGERY WITH YOUR REGULAR TOOTHPASTE  Please do not use if you have an allergy to CHG or antibacterial soaps. If your skin becomes reddened/irritated stop using the CHG.  Do not shave (including legs and underarms) for at least 48 hours prior to first CHG shower. It is OK to shave your face.  Please follow these instructions carefully.   Shower the NIGHT BEFORE SURGERY and the MORNING OF  SURGERY  If you chose to wash your hair, wash your hair first as usual with your normal shampoo.  After you shampoo, rinse your hair and body thoroughly to remove the shampoo.  Use CHG Soap as you would any other liquid soap. You can apply CHG directly to the skin and wash gently with a scrungie or a clean washcloth.   Apply the CHG Soap to your body ONLY FROM THE NECK DOWN.  Do not use on open wounds or open sores. Avoid contact with your eyes, ears, mouth and genitals (private parts). Wash Face and genitals (private parts)  with your normal soap.   Wash thoroughly, paying special attention to the area where your surgery will be performed.  Thoroughly rinse your body with warm water from the neck down.  DO NOT shower/wash with your normal soap after using and rinsing off the CHG Soap.  Pat yourself dry with a CLEAN TOWEL.  Wear CLEAN PAJAMAS to bed the night before surgery  Place CLEAN SHEETS on your bed the night before your surgery  DO NOT SLEEP WITH PETS.   Day of Surgery: Take a shower with CHG soap. Do not wear jewelry or makeup Do not wear lotions, powders, perfumes, or deodorant. Do not shave 48 hours prior to surgery.   Do not bring valuables to the hospital. [facility] is not responsible for any belongings or valuables. Do not wear nail polish, gel polish, artificial nails, or any other type of covering on natural nails (fingers and toes) If you have artificial nails or gel coating that need to be removed by a nail salon, please have this removed prior to surgery. Artificial nails or gel coating may interfere with anesthesia's ability to adequately monitor your vital signs. Wear Clean/Comfortable clothing the morning of surgery Remember to brush your teeth WITH YOUR REGULAR TOOTHPASTE.   Please read over the following fact sheets that you were given.    If you received a COVID test during your pre-op visit  it is requested that you wear a mask when out in public,  stay away from anyone that may not be feeling well and notify your surgeon if you develop symptoms. If you have been in contact with anyone that has tested positive in the last 10 days please notify you surgeon.

## 2022-08-15 ENCOUNTER — Encounter (HOSPITAL_COMMUNITY)
Admission: RE | Admit: 2022-08-15 | Discharge: 2022-08-15 | Disposition: A | Discharge status: Home / Self Care | Payer: Medicare Other | Source: Ambulatory Visit | Attending: General Surgery | Admitting: General Surgery

## 2022-08-15 ENCOUNTER — Encounter (HOSPITAL_COMMUNITY): Payer: Self-pay

## 2022-08-15 ENCOUNTER — Other Ambulatory Visit: Payer: Self-pay

## 2022-08-15 VITALS — BP 126/63 | HR 68 | Temp 98.3°F | Resp 18 | Ht 66.0 in | Wt 192.8 lb

## 2022-08-15 DIAGNOSIS — E785 Hyperlipidemia, unspecified: Secondary | ICD-10-CM | POA: Diagnosis not present

## 2022-08-15 DIAGNOSIS — N6092 Unspecified benign mammary dysplasia of left breast: Secondary | ICD-10-CM | POA: Insufficient documentation

## 2022-08-15 DIAGNOSIS — I10 Essential (primary) hypertension: Secondary | ICD-10-CM | POA: Insufficient documentation

## 2022-08-15 DIAGNOSIS — I422 Other hypertrophic cardiomyopathy: Secondary | ICD-10-CM

## 2022-08-15 DIAGNOSIS — Z01812 Encounter for preprocedural laboratory examination: Secondary | ICD-10-CM | POA: Insufficient documentation

## 2022-08-15 DIAGNOSIS — R011 Cardiac murmur, unspecified: Secondary | ICD-10-CM | POA: Insufficient documentation

## 2022-08-15 DIAGNOSIS — Z8719 Personal history of other diseases of the digestive system: Secondary | ICD-10-CM | POA: Diagnosis not present

## 2022-08-15 DIAGNOSIS — Z01818 Encounter for other preprocedural examination: Secondary | ICD-10-CM

## 2022-08-15 DIAGNOSIS — K219 Gastro-esophageal reflux disease without esophagitis: Secondary | ICD-10-CM | POA: Insufficient documentation

## 2022-08-15 DIAGNOSIS — Z87891 Personal history of nicotine dependence: Secondary | ICD-10-CM | POA: Insufficient documentation

## 2022-08-15 DIAGNOSIS — I700 Atherosclerosis of aorta: Secondary | ICD-10-CM | POA: Insufficient documentation

## 2022-08-15 HISTORY — DX: Gastro-esophageal reflux disease without esophagitis: K21.9

## 2022-08-15 HISTORY — DX: Cardiomegaly: I51.7

## 2022-08-15 HISTORY — DX: Other hypertrophic cardiomyopathy: I42.2

## 2022-08-15 LAB — CBC
HCT: 41.7 % (ref 36.0–46.0)
Hemoglobin: 14 g/dL (ref 12.0–15.0)
MCH: 29.1 pg (ref 26.0–34.0)
MCHC: 33.6 g/dL (ref 30.0–36.0)
MCV: 86.7 fL (ref 80.0–100.0)
Platelets: 285 10*3/uL (ref 150–400)
RBC: 4.81 MIL/uL (ref 3.87–5.11)
RDW: 12.5 % (ref 11.5–15.5)
WBC: 10.9 10*3/uL — ABNORMAL HIGH (ref 4.0–10.5)
nRBC: 0 % (ref 0.0–0.2)

## 2022-08-15 LAB — BASIC METABOLIC PANEL
Anion gap: 9 (ref 5–15)
BUN: 7 mg/dL — ABNORMAL LOW (ref 8–23)
CO2: 27 mmol/L (ref 22–32)
Calcium: 9.3 mg/dL (ref 8.9–10.3)
Chloride: 99 mmol/L (ref 98–111)
Creatinine, Ser: 0.71 mg/dL (ref 0.44–1.00)
GFR, Estimated: >60 mL/min (ref >60)
Glucose, Bld: 98 mg/dL (ref 70–99)
Potassium: 3.9 mmol/L (ref 3.5–5.1)
Sodium: 135 mmol/L (ref 135–145)

## 2022-08-15 NOTE — Progress Notes (Signed)
PCP - Dr. Delynn Flavin Cardiologist - Dr. Rollene Rotunda  PPM/ICD - denies   Chest x-ray - 12/18/21 EKG - 12/18/21 Stress Test - denies ECHO - 09/26/21 Cardiac Cath - denies  Sleep Study - denies   DM- denies  ASA/Blood Thinner Instructions: n/a   ERAS Protcol - yes PRE-SURGERY Ensure given at PAT  COVID TEST- n/a   Anesthesia review: yes, cardiac hx (pt states she sees cardiology once a year for evaluation of heart murmur)  Patient denies shortness of breath, fever, cough and chest pain at PAT appointment   All instructions explained to the patient, with a verbal understanding of the material. Patient agrees to go over the instructions while at home for a better understanding. The opportunity to ask questions was provided.

## 2022-08-16 ENCOUNTER — Encounter (HOSPITAL_COMMUNITY): Payer: Self-pay

## 2022-08-16 NOTE — Anesthesia Preprocedure Evaluation (Signed)
Anesthesia Evaluation  Patient identified by MRN, date of birth, ID band Patient awake    Reviewed: Allergy & Precautions, NPO status , Patient's Chart, lab work & pertinent test results  History of Anesthesia Complications Negative for: history of anesthetic complications  Airway Mallampati: III  TM Distance: <3 FB Neck ROM: Full    Dental  (+) Dental Advisory Given, Teeth Intact   Pulmonary neg shortness of breath, neg sleep apnea, neg COPD, neg recent URI, former smoker   breath sounds clear to auscultation       Cardiovascular hypertension, Pt. on medications (-) angina (-) Past MI and (-) CHF  Rhythm:Regular  Echo 09/26/21: IMPRESSIONS  1. Left ventricular ejection fraction, by estimation, is 60 to 65%. The  left ventricle has normal function. The left ventricle has no regional  wall motion abnormalities. There is severe concentric left ventricular  hypertrophy. Left ventricular diastolic  parameters are consistent with Grade I diastolic dysfunction (impaired  relaxation).  2. Right ventricular systolic function is normal. The right ventricular  size is normal.  3. Left atrial size was severely dilated.  4. The mitral valve is normal in structure. Trivial mitral valve  regurgitation. No evidence of mitral stenosis.  5. The aortic valve is normal in structure. Aortic valve regurgitation is  not visualized. No aortic stenosis is present.  6. The inferior vena cava is normal in size with greater than 50%  respiratory variability, suggesting right atrial pressure of 3 mmHg.    MRI Cardiac 08/13/19: IMPRESSION: 1.  No evidence of cardiac amyloidosis 2. Asymmetric hypertrophy measuring up to 25mm in basal septum (11mm in posterior wall), consistent with hypertrophic cardiomyopathy 3. LVOT obstruction due to systolic anterior motion of the anterior leaflet of the mitral valve, consistent with HOCM 4.  No late gadolinium  enhancement to suggest focal myocardial scar 5.  Normal LV size with hyperdynamic systolic function (EF 73%) 6.  Normal RV size and systolic function (EF 70%)    Neuro/Psych  Headaches  negative psych ROS   GI/Hepatic Neg liver ROS,GERD  Medicated and Controlled,,  Endo/Other  negative endocrine ROS    Renal/GU negative Renal ROS     Musculoskeletal negative musculoskeletal ROS (+)    Abdominal   Peds  Hematology negative hematology ROS (+) Lab Results      Component                Value               Date                      WBC                      10.9 (H)            08/15/2022                HGB                      14.0                08/15/2022                HCT                      41.7                08/15/2022  MCV                      86.7                08/15/2022                PLT                      285                 08/15/2022              Anesthesia Other Findings   Reproductive/Obstetrics                             Anesthesia Physical Anesthesia Plan  ASA: 2  Anesthesia Plan: General   Post-op Pain Management: Tylenol PO (pre-op)* and Toradol IV (intra-op)*   Induction: Intravenous  PONV Risk Score and Plan: 3 and Ondansetron, Dexamethasone, Propofol infusion and TIVA  Airway Management Planned: LMA  Additional Equipment: None  Intra-op Plan:   Post-operative Plan: Extubation in OR  Informed Consent: I have reviewed the patients History and Physical, chart, labs and discussed the procedure including the risks, benefits and alternatives for the proposed anesthesia with the patient or authorized representative who has indicated his/her understanding and acceptance.     Dental advisory given  Plan Discussed with: CRNA  Anesthesia Plan Comments: (PAT note written 08/16/2022 by Shonna Chock, PA-C.  )       Anesthesia Quick Evaluation

## 2022-08-16 NOTE — Progress Notes (Signed)
Anesthesia Chart Review:  Case: [id number] Date/Time: 08/22/22 1215   Procedure: RADIOACTIVE SEED GUIDED BRACKETED  EXCISIONAL LEFT BREAST BIOPSY (Left: Breast)   Anesthesia type: General   Pre-op diagnosis: LEFT BREAST ADH   [facility] ROOM 09 / [facility] OR   Surgeons: [name], MD       DISCUSSION: Patient is a 77 year old female scheduled for the above procedure.  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  Last cardiology visit by Dr. Antoine Poche on 07/17/2022. She was initially seen in 2017 by Dr. Verdis Prime for palpitations with near syncope with tachycardia.  30 day monitor showed NSR. She was seen again by Dr. Antoine Poche in 2019 due to more prominent sounding murmur. Echo showed LVEF 65% with severe LVH with evidence of SAM. MRI cardiac 07/2019 showed no evidence cardiac amyloidosis, normal LV/RV function, LVEF 73%, asymmetric hypertrophy measuring up to 25 mm in the basal septum (11 mm in posterior wall) consistent with hypertrophic cardiomyopathy, LVOT obstruction due to [facility] of the anterior leaflet of the mitral valve consistent with HOCM.  No identifiable gene found.  She was not considered to have any high risk features for SCD.  She has been managed medically.  At her April visit, dyspnea had improved after treatment of her anemia. She remained asymptomatic of LVH/HCM. BP at target goal.  No changes in therapy made.  1 year follow-up planned.   RLS 08/20/22 at 1:30 PM.  Anesthesia team to evaluate on the day of surgery.   VS: BP 126/63   Pulse 68   Temp 36.8 C (Oral)   Resp 18   Ht 5\' 6"  (1.676 m)   Wt 87.5 kg   SpO2 97%   BMI 31.12 kg/m    PROVIDERS: Raliegh Ip, DO is PCP Rollene Rotunda, MD is cardiologist Earnest Bailey, DO  is GI visit 02/21/22 with follow-up in 6 months planned. Rojelio Brenner, PA-C is hematology provider (for IDA due to chronic blood loss, suspected due to small bowel AVMs.) S/p Feraheme in 2023 x2.  Last office visit 06/07/2022. Hgb 13.2/MCV 89.8, ferritin 98, iron saturation 25%.  Continue daily oral iron and B12 supplements.  Follow-up with labs planned in 3 months.   LABS: Labs reviewed: Acceptable for surgery. (all labs ordered are listed, but only abnormal results are displayed)  Labs Reviewed  CBC - Abnormal; Notable for the following components:      Result Value   WBC 10.9 (*)    All other components within normal limits  BASIC METABOLIC PANEL - Abnormal; Notable for the following components:   BUN 7 (*)    All other components within normal limits     IMAGES: 1V PCXR 12/18/21: FINDINGS: The cardiac silhouette is mildly enlarged and unchanged in size. Both lungs are clear. Multilevel degenerative changes are seen throughout the thoracic spine. IMPRESSION: Stable cardiomegaly without evidence of acute or active cardiopulmonary disease.   CT Head & CTA Head/Neck 09/25/21: IMPRESSION: 1. No emergent large vessel occlusion or high-grade stenosis of the intracranial or cervical arteries. 2. Aortic Atherosclerosis (ICD10-I70.0).    EKG: 12/19/21: Normal sinus rhythm Possible Left atrial enlargement Left ventricular hypertrophy with repolarization abnormality ( Sokolow-Lyon , Cornell product ) Cannot rule out Septal infarct , age undetermined Abnormal ECG When compared with ECG of 06-Oct-2021 15:25, No significant change  since last tracing Confirmed by Meridee Score [phone]) on 12/18/2021 5:48:52 PM   CV: Echo 09/26/21: IMPRESSIONS   1. Left ventricular ejection fraction, by estimation, is 60 to 65%. The  left ventricle has normal function. The left ventricle has no regional  wall motion abnormalities. There is severe concentric left ventricular  hypertrophy. Left  ventricular diastolic   parameters are consistent with Grade I diastolic dysfunction (impaired  relaxation).   2. Right ventricular systolic function is normal. The right ventricular  size is normal.   3. Left atrial size was severely dilated.   4. The mitral valve is normal in structure. Trivial mitral valve  regurgitation. No evidence of mitral stenosis.   5. The aortic valve is normal in structure. Aortic valve regurgitation is  not visualized. No aortic stenosis is present.   6. The inferior vena cava is normal in size with greater than 50%  respiratory variability, suggesting right atrial pressure of 3 mmHg.    MRI Cardiac 08/13/19: IMPRESSION: 1.  No evidence of cardiac amyloidosis 2. Asymmetric hypertrophy measuring up to 25mm in basal septum (11mm in posterior wall), consistent with hypertrophic cardiomyopathy 3. LVOT obstruction due to systolic anterior motion of the anterior leaflet of the mitral valve, consistent with HOCM 4.  No late gadolinium enhancement to suggest focal myocardial scar 5.  Normal LV size with hyperdynamic systolic function (EF 73%) 6.  Normal RV size and systolic function (EF 70%)    Cardiac event monitor 10/17/15 - 11/16/15: Normal sinus rhythm No arrhythmia, either tachy or brady No abnormality to correlate with symptom Normal  Past Medical History:  Diagnosis Date   Anemia    Asymmetric septal hypertrophy (HCC)    Blood transfusion without reported diagnosis    Cataract    removed bilateral   Chronic cystitis    Diverticulitis    GERD (gastroesophageal reflux disease)    Heart murmur    Hyperlipidemia    Hypertension    Personal history of colonic polyps-adenomas 01/07/2012   2009 - 2 diminutive adenomas (prior polyps also) 01/07/2012 - 2 diminutive adenomas      Past Surgical History:  Procedure Laterality Date   BREAST BIOPSY Right    No Scar seen    BREAST BIOPSY Left 05/10/2022   MM LT BREAST BX W LOC DEV EA AD LESION IMG BX  SPEC STEREO GUIDE 05/10/2022 [facility]   BREAST BIOPSY Left 05/10/2022   MM LT BREAST BX W LOC DEV 1ST LESION IMAGE BX SPEC STEREO GUIDE 05/10/2022 [facility]   CATARACT EXTRACTION Bilateral    COLONOSCOPY  01/2020   Dr. Rhea Belton; Five 3 to 9 mm polyps in the ascending colon, Two 4 to 5 mm polyps in the transverse colon, diverticulosis, eryethema and petechia in left colon biopsied, external and internal hemorrhoids. Pathology with tubular adenomas. Left colon biopsy was benign. Recommended 3 year surveillance.   ENTEROSCOPY N/A 09/26/2021   Procedure: ENTEROSCOPY;  Surgeon: Iva Boop, MD;  Location: [facility] ENDOSCOPY;  Service: Gastroenterology;  Laterality: N/A;   ENTEROSCOPY N/A 12/19/2021   Procedure: ENTEROSCOPY;  Surgeon: Corbin Ade, MD;  Location[facility]O [facility];  Service: Endoscopy;  Laterality: N/A;   ESOPHAGOGASTRODUODENOSCOPY N/A 10/07/2021   Procedure: ESOPHAGOGASTRODUODENOSCOPY (EGD);  Surgeon: Corbin Ade, MD;  Location: [facility];  Service: Endoscopy;  Laterality: N/A;  EGD with enteroscopy  with pediatric colonoscope   ESOPHAGOGASTRODUODENOSCOPY (EGD) WITH PROPOFOL N/A 11/23/2019   Procedure: ESOPHAGOGASTRODUODENOSCOPY (EGD) WITH PROPOFOL;  Surgeon: Eula Listen  M, MD;  Location: [facility];  Service: Endoscopy;  Laterality: N/A;   ESOPHAGOGASTRODUODENOSCOPY (EGD) WITH PROPOFOL N/A 10/20/2020   Procedure: ESOPHAGOGASTRODUODENOSCOPY (EGD) WITH PROPOFOL;  Surgeon: Lanelle Bal, DO;  [facility];  Service: Endoscopy;  Laterality: N/A;   ESOPHAGOGASTRODUODENOSCOPY (EGD) WITH PROPOFOL N/A 12/05/2020   Procedure: ESOPHAGOGASTRODUODENOSCOPY (EGD) WITH PROPOFOL;  Surgeon: Malissa Hippo, MD;  [facility];  Service: Endoscopy;  Laterality: N/A;  with enteroscopy   ESOPHAGOGASTRODUODENOSCOPY (EGD) WITH PROPOFOL N/A 12/11/2020   Procedure: ESOPHAGOGASTRODUODENOSCOPY (EGD) WITH PROPOFOL;  Surgeon: Lanelle Bal, DO;  [facility]  END[facility] SU[facility];  Service: Endoscopy;  Laterality: N/A;   ESOPHAGOGASTRODUODENOSCOPY (EGD) WITH PROPOFOL N/A 09/17/2021   Procedure: ESOPHAGOGASTRODUODENOSCOPY (EGD) WITH PROPOFOL;  Surgeon: Lanelle Bal, DO;  [facility];  Service: Endoscopy;  Laterality: N/A;   ESOPHAGOGASTRODUODENOSCOPY (EGD) WITH PROPOFOL N/A 12/19/2021   Procedure: ESOPHAGOGASTRODUODENOSCOPY (EGD) WITH PROPOFOL;  Surgeon: Corbin Ade, MD;  [facility];  Service: Endoscopy;  Laterality: N/A;  with pediatric scope   GIVENS CAPSULE STUDY N/A 11/23/2019   Procedure: GIVENS CAPSULE STUDY;  Surgeon: Corbin Ade, MD;  [facility];  Service: Endoscopy;  Laterality: N/A;   GIVENS CAPSULE STUDY N/A 10/22/2020   Procedure: GIVENS CAPSULE STUDY;  Surgeon: Lanelle Bal, DO;  [facility];  Service: Endoscopy;  Laterality: N/A;   GIVENS CAPSULE STUDY N/A 09/26/2021   Procedure: GIVENS CAPSULE STUDY;  Surgeon: Iva Boop, MD;  Location: [facility] ENDOSCOPY;  Service: Gastroenterology;  Laterality: N/A;   HEMOSTASIS CLIP PLACEMENT  12/11/2020   Procedure: HEMOSTASIS CLIP PLACEMENT;  Surgeon: Lanelle Bal, DO;  Location[facility];  Service: Endoscopy;;   IR ANGIOGRAM SELECTIVE EACH ADDITIONAL VESSEL  12/05/2020   IR ANGIOGRAM SELECTIVE EACH ADDITIONAL VESSEL  12/05/2020   IR ANGIOGRAM SELECTIVE EACH ADDITIONAL VESSEL  12/05/2020   IR ANGIOGRAM SELECTIVE EACH ADDITIONAL VESSEL  12/13/2020   IR ANGIOGRAM SELECTIVE EACH ADDITIONAL VESSEL  12/13/2020   IR ANGIOGRAM VISCERAL SELECTIVE  12/05/2020   IR ANGIOGRAM VISCERAL SELECTIVE  12/13/2020   IR EMBO ART  VEN HEMORR LYMPH EXTRAV  INC GUIDE ROADMAPPING  12/05/2020   IR EMBO ART  VEN HEMORR LYMPH EXTRAV  INC GUIDE ROADMAPPING  12/13/2020   IR RADIOLOGIST EVAL & MGMT  01/11/2021   IR US GUIDE VASC ACCESS LEFT  12/13/2020   IR US GUIDE VASC ACCESS RIGHT  12/05/2020   SUBMUCOSAL TATTOO INJECTION  09/26/2021   Procedure:  SUBMUCOSAL TATTOO INJECTION;  Surgeon: Iva Boop, MD;  Location: [facility] ENDOSCOPY;  Service: Gastroenterology;;    MEDICATIONS:  acetaminophen (TYLENOL) 325 MG tablet   Ascorbic Acid (VITAMIN C PO)   bisoprolol (ZEBETA) 5 MG tablet   CALCIUM PO   Cholecalciferol (VITAMIN D-3 PO)   COPPER PO   cyanocobalamin (CYANOCOBALAMIN) 500 MCG tablet   ferrous sulfate ([employer] IRON) 325 (65 FE) MG tablet   fluconazole (DIFLUCAN) 150 MG tablet   levocetirizine (XYZAL) 5 MG tablet   MAGNESIUM PO   pantoprazole (PROTONIX) 40 MG tablet   rosuvastatin (CRESTOR) 10 MG tablet   Vitamin D, Ergocalciferol, (DRISDOL) 1.25 MG (50000 UNIT) CAPS capsule   VITAMIN E PO   No current facility-administered medications for this encounter.    Shonna Chock, PA-C Surgical Short Stay/Anesthesiology [facility] Phone [phone] [facility] Phone [phone] 08/16/2022 11:35 AM

## 2022-08-20 ENCOUNTER — Ambulatory Visit
Admission: RE | Admit: 2022-08-20 | Discharge: 2022-08-20 | Disposition: A | Discharge status: Home / Self Care | Payer: Medicare Other | Source: Ambulatory Visit | Attending: General Surgery | Admitting: General Surgery

## 2022-08-20 DIAGNOSIS — Z510 Encounter for antineoplastic radiation therapy: Secondary | ICD-10-CM | POA: Diagnosis not present

## 2022-08-20 DIAGNOSIS — N63 Unspecified lump in unspecified breast: Secondary | ICD-10-CM | POA: Diagnosis not present

## 2022-08-20 DIAGNOSIS — N6092 Unspecified benign mammary dysplasia of left breast: Secondary | ICD-10-CM

## 2022-08-20 HISTORY — PX: BREAST BIOPSY: SHX20

## 2022-08-21 ENCOUNTER — Ambulatory Visit (INDEPENDENT_AMBULATORY_CARE_PROVIDER_SITE_OTHER): Payer: Medicare Other

## 2022-08-21 ENCOUNTER — Encounter: Payer: Self-pay | Admitting: Family Medicine

## 2022-08-21 ENCOUNTER — Ambulatory Visit (INDEPENDENT_AMBULATORY_CARE_PROVIDER_SITE_OTHER): Payer: Medicare Other | Admitting: Family Medicine

## 2022-08-21 VITALS — BP 126/67 | HR 68 | Temp 97.2°F | Ht 66.0 in | Wt 195.0 lb

## 2022-08-21 DIAGNOSIS — R102 Pelvic and perineal pain: Secondary | ICD-10-CM

## 2022-08-21 DIAGNOSIS — R300 Dysuria: Secondary | ICD-10-CM

## 2022-08-21 DIAGNOSIS — M25552 Pain in left hip: Secondary | ICD-10-CM | POA: Diagnosis not present

## 2022-08-21 DIAGNOSIS — M25551 Pain in right hip: Secondary | ICD-10-CM | POA: Diagnosis not present

## 2022-08-21 LAB — URINALYSIS, COMPLETE
Bilirubin, UA: NEGATIVE
Glucose, UA: NEGATIVE
Ketones, UA: NEGATIVE
Nitrite, UA: NEGATIVE
Protein,UA: NEGATIVE
Specific Gravity, UA: 1.01 (ref 1.005–1.030)
Urobilinogen, Ur: 0.2 mg/dL (ref 0.2–1.0)
pH, UA: 6 (ref 5.0–7.5)

## 2022-08-21 LAB — MICROSCOPIC EXAMINATION

## 2022-08-21 MED ORDER — METHOCARBAMOL 500 MG PO TABS
500.0000 mg | ORAL_TABLET | Freq: Four times a day (QID) | ORAL | 0 refills | Status: AC
Start: 2022-08-21 — End: 2022-08-26

## 2022-08-21 MED ORDER — NITROFURANTOIN MONOHYD MACRO 100 MG PO CAPS
100.0000 mg | ORAL_CAPSULE | Freq: Two times a day (BID) | ORAL | 0 refills | Status: AC
Start: 2022-08-21 — End: 2022-08-26

## 2022-08-21 NOTE — H&P (Signed)
  77 year old female who is otherwise healthy. She has no prior breast history. She have a basal cell removed on her right breast in the early 90s. She does have a sister with breast cancer. She had no mass or discharge. She underwent a screening mammogram that showed her to have B density breast tissue in the left breast there were an area of calcifications. On magnification views there is a 3.1 cm group of indeterminate calcifications in the lower outer left breast. Both of these were then biopsied. The X clip was fibrocystic changes with UDH and this was recommended for excision. The coil clip was concordant and was ADH and recommended for excision. She is here to discuss her options.  Review of Systems: A complete review of systems was obtained from the patient. I have reviewed this information and discussed as appropriate with the patient. See HPI as well for other ROS.  Review of Systems  All other systems reviewed and are negative.  Medical History: Past Medical History:  Diagnosis Date  Hyperlipidemia   There is no problem list on file for this patient.  Past Surgical History:  Procedure Laterality Date  Breast surgery Right 1992  Basal cell removed   Allergies  Allergen Reactions  Penicillin Hives  Sulfa (Sulfonamide Antibiotics) Rash  Sulfur (Not Sulfa) Rash   Current Outpatient Medications on File Prior to Visit  Medication Sig Dispense Refill  bisoprolol (ZEBETA) 5 MG tablet Take 0.5 tablets by mouth once daily  copper gluconate, bulk, 100 % Powd Take by mouth  cyanocobalamin (VITAMIN B12) 1000 MCG tablet Take 1,000 mcg by mouth once daily  ergocalciferol, vitamin D2, 1,250 mcg (50,000 unit) capsule Take 1 capsule by mouth every 7 (seven) days  ferrous sulfate 325 (65 FE) MG tablet Take 1 tablet by mouth once daily  pantoprazole (PROTONIX) 40 MG DR tablet Take 1 tablet by mouth 2 (two) times daily  rosuvastatin (CRESTOR) 10 MG tablet Take 1 tablet by mouth at bedtime   vitamin E acetate, bulk, 50 % Powd Take 1 tablet by mouth once daily   History reviewed. No pertinent family history.   Social History   Tobacco Use  Smoking Status Former  Types: Cigarettes  Start date: 1994  Smokeless Tobacco Never  Marital status: Married  Tobacco Use  Smoking status: Former  Types: Cigarettes  Start date: 1994  Smokeless tobacco: Never  Substance and Sexual Activity  Alcohol use: Never  Drug use: Never    Objective:   Vitals:  07/29/22 1436 07/29/22 1437  BP: 130/79  Pulse: 76  SpO2: 93%  Weight: 89.7 kg (197 lb 12.8 oz)  Height: 167.6 cm (5\' 6" )   Body mass index is 31.93 kg/m.  Physical Exam Vitals reviewed.  Constitutional:  Appearance: Normal appearance.  Chest:  Breasts: Right: No inverted nipple, mass or nipple discharge.  Left: No inverted nipple, mass or nipple discharge.  Lymphadenopathy:  Upper Body:  Right upper body: No supraclavicular or axillary adenopathy.  Left upper body: No supraclavicular or axillary adenopathy.  Neurological:  Mental Status: She is alert.   Assessment and Plan:   Left breast seed bracketed excisional biopsy.  We discussed the pathology from the biopsies and the indication for excision. We discussed there is an upgrade rate with atypical ductal hyperplasia. We discussed the risks, recovery from surgery. We discussed if there is an upgrade to an early cancer that she may certainly need additional surgery. I will plan on scheduling her.

## 2022-08-21 NOTE — Progress Notes (Signed)
Acute Office Visit  Subjective:  Patient ID: Stacey Reyes, female    DOB: Dec 29, 1945, 77 y.o.   MRN: [id number]  Chief Complaint  Patient presents with   Hip Pain    In joins both hips. Been going on for weeks but getting worse. Clips from where she had veins bleeding.   Urinary Tract Infection   Hip Pain   Urinary Tract Infection    Patient is in today for Groin Pain. States that she moves it hurts. Getting in her car is very painful. Has gotten worse since she had UTI two week ago. Started 2-3 weeks ago. States that she has to roll to get out of bed. Taking tylenol 30 minutes before getting up. States that as the day goes on it gets slightly better. She denies any trauma, popping. Denies any numbness, tingling. She states the only thing different that she has done is ride the riding [employer], [employer]. Had breast biopsy 08/15/22. Treated for UTI on 07/01/22. Is taking Azo OTC   UTI  Had UTI about one month ago. Feels increased frequency. States that she still has burning.   ROS As per HPI   Objective:  BP 126/67   Pulse 68   Temp (!) 97.2 F (36.2 C) (Temporal)   Ht 5\' 6"  (1.676 m)   Wt 195 lb (88.5 kg)   SpO2 96%   BMI 31.47 kg/m   Physical Exam Constitutional:      General: She is awake. She is not in acute distress.    Appearance: Normal appearance. She is well-developed and well-groomed. She is not ill-appearing, toxic-appearing or diaphoretic.  Cardiovascular:     Rate and Rhythm: Normal rate.     Pulses: Normal pulses.          Radial pulses are 2+ on the right side and 2+ on the left side.       Posterior tibial pulses are 2+ on the right side and 2+ on the left side.     Heart sounds: Murmur heard.     No gallop.  Pulmonary:     Effort: Pulmonary effort is normal. No respiratory distress.     Breath sounds: Normal breath sounds. No stridor. No wheezing, rhonchi or rales.  Musculoskeletal:     Cervical back: Full passive range of motion without pain and neck  supple.     Right lower leg: No edema.     Left lower leg: No edema.  Skin:    General: Skin is warm.     Capillary Refill: Capillary refill takes less than 2 seconds.  Neurological:     General: No focal deficit present.     Mental Status: She is alert, oriented to person, place, and time and easily aroused. Mental status is at baseline.     GCS: GCS eye subscore is 4. GCS verbal subscore is 5. GCS motor subscore is 6.     Motor: No weakness.  Psychiatric:        Attention and Perception: Attention and perception normal.        Mood and Affect: Mood and affect normal.        Speech: Speech normal.        Behavior: Behavior normal. Behavior is cooperative.        Thought Content: Thought content normal. Thought content does not include homicidal or suicidal ideation. Thought content does not include homicidal or suicidal plan.        Cognition and Memory: Cognition  and memory normal.        Judgment: Judgment normal.       08/21/2022    8:49 AM 07/01/2022    9:01 AM 05/21/2022    3:33 PM  Depression screen PHQ 2/9  Decreased Interest 0 0 0  Down, Depressed, Hopeless 0 0 0  PHQ - 2 Score 0 0 0  Altered sleeping 0 2 2  Tired, decreased energy 0 1 1  Change in appetite 0 0 2  Feeling bad or failure about yourself  0 0 0  Trouble concentrating 0 0 0  Moving slowly or fidgety/restless 0 0 0  Suicidal thoughts 0 0 0  PHQ-9 Score 0 3 5  Difficult doing work/chores Not difficult at all Not difficult at all Somewhat difficult      07/01/2022    9:03 AM 05/21/2022    3:35 PM 10/17/2021   10:24 AM 06/11/2021    1:39 PM  GAD 7 : Generalized Anxiety Score  Nervous, Anxious, on Edge 0 0 1 1  Control/stop worrying 0 1 0 0  Worry too much - different things 1 1 0 1  Trouble relaxing 0 0 0 1  Restless 0 0 0 0  Easily annoyed or irritable 1 1 0 0  Afraid - awful might happen 0 0 0 0  Total GAD 7 Score 2 3 1 3   Anxiety Difficulty Not difficult at all Not difficult at all Not difficult at  all Not difficult at all   Assessment & Plan:  1. Dysuria Labs as below. Will communicate results to patient once available.  Will treat with macrobid as below. Limited in antibiotic options due to allergies and resistance on previous culture.  - Urinalysis, Complete - Urine Culture - DG HIPS BILAT WITH PELVIS MIN 5 VIEWS; Future - nitrofurantoin, macrocrystal-monohydrate, (MACROBID) 100 MG capsule; Take 1 capsule (100 mg total) by mouth 2 (two) times daily for 5 days.  Dispense: 10 capsule; Refill: 0  2. Pelvic pain Imaging as below. Patient has procedure tomorrow. Do not want to prescribe NSAIDs or Prednisone pre procedure.  - DG HIPS BILAT WITH PELVIS MIN 5 VIEWS; Future - methocarbamol (ROBAXIN) 500 MG tablet; Take 1 tablet (500 mg total) by mouth 4 (four) times daily for 5 days.  Dispense: 20 tablet; Refill: 0  The above assessment and management plan was discussed with the patient. The patient verbalized understanding of and has agreed to the management plan using shared-decision making. Patient is aware to call the clinic if they develop any new symptoms or if symptoms fail to improve or worsen. Patient is aware when to return to the clinic for a follow-up visit. Patient educated on when it is appropriate to go to the emergency department.   Return if symptoms worsen or fail to improve.  Neale Burly, DNP-FNP [facility] [address] [place], [place] [address] [phone]

## 2022-08-22 ENCOUNTER — Ambulatory Visit (HOSPITAL_COMMUNITY): Payer: Medicare Other | Admitting: Vascular Surgery

## 2022-08-22 ENCOUNTER — Encounter (HOSPITAL_COMMUNITY)
Admission: RE | Disposition: A | Discharge status: Home / Self Care | Payer: Self-pay | Source: Home / Self Care | Attending: General Surgery

## 2022-08-22 ENCOUNTER — Ambulatory Visit
Admission: RE | Admit: 2022-08-22 | Discharge: 2022-08-22 | Disposition: A | Discharge status: Home / Self Care | Payer: Medicare Other | Source: Ambulatory Visit | Attending: General Surgery | Admitting: General Surgery

## 2022-08-22 ENCOUNTER — Encounter (HOSPITAL_COMMUNITY): Payer: Self-pay | Admitting: General Surgery

## 2022-08-22 ENCOUNTER — Other Ambulatory Visit: Payer: Self-pay

## 2022-08-22 ENCOUNTER — Ambulatory Visit (HOSPITAL_BASED_OUTPATIENT_CLINIC_OR_DEPARTMENT_OTHER): Payer: Medicare Other | Admitting: Certified Registered Nurse Anesthetist

## 2022-08-22 ENCOUNTER — Ambulatory Visit (HOSPITAL_COMMUNITY)
Admission: RE | Admit: 2022-08-22 | Discharge: 2022-08-22 | Disposition: A | Discharge status: Home / Self Care | Payer: Medicare Other | Attending: General Surgery | Admitting: General Surgery

## 2022-08-22 DIAGNOSIS — N6082 Other benign mammary dysplasias of left breast: Secondary | ICD-10-CM | POA: Diagnosis not present

## 2022-08-22 DIAGNOSIS — I10 Essential (primary) hypertension: Secondary | ICD-10-CM | POA: Diagnosis not present

## 2022-08-22 DIAGNOSIS — R920 Mammographic microcalcification found on diagnostic imaging of breast: Secondary | ICD-10-CM | POA: Diagnosis not present

## 2022-08-22 DIAGNOSIS — Z87891 Personal history of nicotine dependence: Secondary | ICD-10-CM | POA: Diagnosis not present

## 2022-08-22 DIAGNOSIS — N6320 Unspecified lump in the left breast, unspecified quadrant: Secondary | ICD-10-CM | POA: Diagnosis not present

## 2022-08-22 DIAGNOSIS — N6489 Other specified disorders of breast: Secondary | ICD-10-CM | POA: Insufficient documentation

## 2022-08-22 DIAGNOSIS — N6012 Diffuse cystic mastopathy of left breast: Secondary | ICD-10-CM | POA: Diagnosis not present

## 2022-08-22 DIAGNOSIS — Z79899 Other long term (current) drug therapy: Secondary | ICD-10-CM | POA: Diagnosis not present

## 2022-08-22 DIAGNOSIS — Z9889 Other specified postprocedural states: Secondary | ICD-10-CM | POA: Diagnosis not present

## 2022-08-22 DIAGNOSIS — N6092 Unspecified benign mammary dysplasia of left breast: Secondary | ICD-10-CM

## 2022-08-22 DIAGNOSIS — Z803 Family history of malignant neoplasm of breast: Secondary | ICD-10-CM | POA: Diagnosis not present

## 2022-08-22 DIAGNOSIS — N6032 Fibrosclerosis of left breast: Secondary | ICD-10-CM | POA: Diagnosis not present

## 2022-08-22 DIAGNOSIS — K219 Gastro-esophageal reflux disease without esophagitis: Secondary | ICD-10-CM | POA: Insufficient documentation

## 2022-08-22 DIAGNOSIS — N6323 Unspecified lump in the left breast, lower outer quadrant: Secondary | ICD-10-CM | POA: Diagnosis present

## 2022-08-22 DIAGNOSIS — N6042 Mammary duct ectasia of left breast: Secondary | ICD-10-CM | POA: Diagnosis not present

## 2022-08-22 HISTORY — PX: RADIOACTIVE SEED GUIDED EXCISIONAL BREAST BIOPSY: SHX6490

## 2022-08-22 SURGERY — RADIOACTIVE SEED GUIDED BREAST BIOPSY
Anesthesia: General | Site: Breast | Laterality: Left

## 2022-08-22 MED ORDER — ACETAMINOPHEN 500 MG PO TABS
1000.0000 mg | ORAL_TABLET | ORAL | Status: DC
  Filled 2022-08-22: qty 2

## 2022-08-22 MED ORDER — PROPOFOL 10 MG/ML IV BOLUS
INTRAVENOUS | Status: AC
  Filled 2022-08-22: qty 20

## 2022-08-22 MED ORDER — SUCCINYLCHOLINE CHLORIDE 200 MG/10ML IV SOSY
PREFILLED_SYRINGE | INTRAVENOUS | Status: AC
  Filled 2022-08-22: qty 10

## 2022-08-22 MED ORDER — FENTANYL CITRATE (PF) 250 MCG/5ML IJ SOLN
INTRAMUSCULAR | Status: AC
  Filled 2022-08-22: qty 5

## 2022-08-22 MED ORDER — FENTANYL CITRATE (PF) 100 MCG/2ML IJ SOLN: 25.0000 ug | INTRAMUSCULAR | Status: DC | PRN

## 2022-08-22 MED ORDER — CHLORHEXIDINE GLUCONATE 0.12 % MT SOLN
15.0000 mL | Freq: Once | OROMUCOSAL | Status: AC
  Administered 2022-08-22: 15 mL via OROMUCOSAL
  Filled 2022-08-22: qty 15

## 2022-08-22 MED ORDER — ACETAMINOPHEN 10 MG/ML IV SOLN: 1000.0000 mg | Freq: Once | INTRAVENOUS | Status: DC | PRN

## 2022-08-22 MED ORDER — CHLORHEXIDINE GLUCONATE CLOTH 2 % EX PADS: 6.0000 | MEDICATED_PAD | Freq: Once | CUTANEOUS | Status: DC

## 2022-08-22 MED ORDER — ACETAMINOPHEN 650 MG RE SUPP: 650.0000 mg | RECTAL | Status: DC | PRN

## 2022-08-22 MED ORDER — OXYCODONE HCL 5 MG PO TABS
ORAL_TABLET | ORAL | Status: AC
  Filled 2022-08-22: qty 1

## 2022-08-22 MED ORDER — ENSURE PRE-SURGERY PO LIQD: 296.0000 mL | Freq: Once | ORAL | Status: DC

## 2022-08-22 MED ORDER — ONDANSETRON HCL 4 MG/2ML IJ SOLN
INTRAMUSCULAR | Status: AC
  Filled 2022-08-22: qty 4

## 2022-08-22 MED ORDER — ROCURONIUM BROMIDE 10 MG/ML (PF) SYRINGE
PREFILLED_SYRINGE | INTRAVENOUS | Status: AC
  Filled 2022-08-22: qty 10

## 2022-08-22 MED ORDER — ACETAMINOPHEN 160 MG/5ML PO SOLN: 1000.0000 mg | Freq: Once | ORAL | Status: DC | PRN

## 2022-08-22 MED ORDER — SODIUM CHLORIDE 0.9 % IV SOLN: 250.0000 mL | INTRAVENOUS | Status: DC | PRN

## 2022-08-22 MED ORDER — DEXAMETHASONE SODIUM PHOSPHATE 10 MG/ML IJ SOLN
INTRAMUSCULAR | Status: DC | PRN
  Administered 2022-08-22: 10 mg via INTRAVENOUS

## 2022-08-22 MED ORDER — SODIUM CHLORIDE 0.9% FLUSH: 3.0000 mL | INTRAVENOUS | Status: DC | PRN

## 2022-08-22 MED ORDER — PHENYLEPHRINE 80 MCG/ML (10ML) SYRINGE FOR IV PUSH (FOR BLOOD PRESSURE SUPPORT)
PREFILLED_SYRINGE | INTRAVENOUS | Status: AC
  Filled 2022-08-22: qty 10

## 2022-08-22 MED ORDER — OXYCODONE HCL 5 MG PO TABS: 5.0000 mg | ORAL_TABLET | ORAL | Status: DC | PRN

## 2022-08-22 MED ORDER — 0.9 % SODIUM CHLORIDE (POUR BTL) OPTIME
TOPICAL | Status: DC | PRN
  Administered 2022-08-22: 1000 mL

## 2022-08-22 MED ORDER — FENTANYL CITRATE (PF) 250 MCG/5ML IJ SOLN
INTRAMUSCULAR | Status: DC | PRN
  Administered 2022-08-22: 50 ug via INTRAVENOUS

## 2022-08-22 MED ORDER — SODIUM CHLORIDE 0.9% FLUSH: 3.0000 mL | Freq: Two times a day (BID) | INTRAVENOUS | Status: DC

## 2022-08-22 MED ORDER — BUPIVACAINE-EPINEPHRINE (PF) 0.25% -1:200000 IJ SOLN
INTRAMUSCULAR | Status: AC
  Filled 2022-08-22: qty 30

## 2022-08-22 MED ORDER — ACETAMINOPHEN 500 MG PO TABS: 1000.0000 mg | ORAL_TABLET | Freq: Once | ORAL | Status: DC | PRN

## 2022-08-22 MED ORDER — KETOROLAC TROMETHAMINE 30 MG/ML IJ SOLN
INTRAMUSCULAR | Status: AC
  Filled 2022-08-22: qty 1

## 2022-08-22 MED ORDER — ONDANSETRON HCL 4 MG/2ML IJ SOLN
INTRAMUSCULAR | Status: AC
  Filled 2022-08-22: qty 2

## 2022-08-22 MED ORDER — ACETAMINOPHEN 325 MG PO TABS: 650.0000 mg | ORAL_TABLET | ORAL | Status: DC | PRN

## 2022-08-22 MED ORDER — ORAL CARE MOUTH RINSE: 15.0000 mL | Freq: Once | OROMUCOSAL | Status: AC

## 2022-08-22 MED ORDER — PHENYLEPHRINE 80 MCG/ML (10ML) SYRINGE FOR IV PUSH (FOR BLOOD PRESSURE SUPPORT)
PREFILLED_SYRINGE | INTRAVENOUS | Status: DC | PRN
  Administered 2022-08-22 (×2): 80 ug via INTRAVENOUS

## 2022-08-22 MED ORDER — ONDANSETRON HCL 4 MG/2ML IJ SOLN
INTRAMUSCULAR | Status: DC | PRN
  Administered 2022-08-22: 4 mg via INTRAVENOUS

## 2022-08-22 MED ORDER — PROPOFOL 10 MG/ML IV BOLUS
INTRAVENOUS | Status: DC | PRN
  Administered 2022-08-22: 200 mg via INTRAVENOUS

## 2022-08-22 MED ORDER — PROPOFOL 500 MG/50ML IV EMUL
INTRAVENOUS | Status: DC | PRN
  Administered 2022-08-22: 125 ug/kg/min via INTRAVENOUS

## 2022-08-22 MED ORDER — OXYCODONE HCL 5 MG PO TABS
5.0000 mg | ORAL_TABLET | Freq: Once | ORAL | Status: AC | PRN
  Administered 2022-08-22: 5 mg via ORAL

## 2022-08-22 MED ORDER — ATROPINE SULFATE 0.4 MG/ML IV SOLN
INTRAVENOUS | Status: AC
  Filled 2022-08-22: qty 1

## 2022-08-22 MED ORDER — LACTATED RINGERS IV SOLN: INTRAVENOUS | Status: DC

## 2022-08-22 MED ORDER — OXYCODONE HCL 5 MG/5ML PO SOLN: 5.0000 mg | Freq: Once | ORAL | Status: AC | PRN

## 2022-08-22 MED ORDER — KETOROLAC TROMETHAMINE 30 MG/ML IJ SOLN
INTRAMUSCULAR | Status: DC | PRN
  Administered 2022-08-22: 30 mg via INTRAVENOUS

## 2022-08-22 MED ORDER — DEXAMETHASONE SODIUM PHOSPHATE 10 MG/ML IJ SOLN
INTRAMUSCULAR | Status: AC
  Filled 2022-08-22: qty 2

## 2022-08-22 MED ORDER — SODIUM CHLORIDE 0.9 % IV SOLN: INTRAVENOUS | Status: DC

## 2022-08-22 MED ORDER — BUPIVACAINE-EPINEPHRINE 0.25% -1:200000 IJ SOLN
INTRAMUSCULAR | Status: DC | PRN
  Administered 2022-08-22: 10 mL

## 2022-08-22 MED ORDER — CEFAZOLIN SODIUM-DEXTROSE 2-4 GM/100ML-% IV SOLN
2.0000 g | INTRAVENOUS | Status: AC
  Administered 2022-08-22: 2 g via INTRAVENOUS
  Filled 2022-08-22: qty 100

## 2022-08-22 MED ORDER — DEXAMETHASONE SODIUM PHOSPHATE 10 MG/ML IJ SOLN
INTRAMUSCULAR | Status: AC
  Filled 2022-08-22: qty 1

## 2022-08-22 MED ORDER — ACETAMINOPHEN 500 MG PO TABS
500.0000 mg | ORAL_TABLET | Freq: Once | ORAL | Status: AC
  Administered 2022-08-22: 500 mg via ORAL

## 2022-08-22 SURGICAL SUPPLY — 42 items
ADH SKN CLS APL DERMABOND .7 (GAUZE/BANDAGES/DRESSINGS) ×1
APL PRP STRL LF DISP 70% ISPRP (MISCELLANEOUS) ×1
APPLIER CLIP 9.375 MED OPEN (MISCELLANEOUS) ×1
APR CLP MED 9.3 20 MLT OPN (MISCELLANEOUS) ×1
BAG COUNTER SPONGE SURGICOUNT (BAG) ×1 IMPLANT
BAG SPNG CNTER NS LX DISP (BAG) ×1
BINDER BREAST LRG (GAUZE/BANDAGES/DRESSINGS) IMPLANT
BINDER BREAST XLRG (GAUZE/BANDAGES/DRESSINGS) IMPLANT
CANISTER SUCT 3000ML PPV (MISCELLANEOUS) ×1 IMPLANT
CHLORAPREP W/TINT 26 (MISCELLANEOUS) ×1 IMPLANT
CLIP APPLIE 9.375 MED OPEN (MISCELLANEOUS) IMPLANT
CLIP TI MEDIUM 6 (CLIP) ×1 IMPLANT
COVER PROBE W GEL 5X96 (DRAPES) ×1 IMPLANT
COVER SURGICAL LIGHT HANDLE (MISCELLANEOUS) ×1 IMPLANT
DERMABOND ADVANCED .7 DNX12 (GAUZE/BANDAGES/DRESSINGS) ×1 IMPLANT
DEVICE DUBIN SPECIMEN MAMMOGRA (MISCELLANEOUS) ×1 IMPLANT
DRAPE CHEST BREAST 15X10 FENES (DRAPES) ×1 IMPLANT
ELECT COATED BLADE 2.86 ST (ELECTRODE) ×1 IMPLANT
ELECT REM PT RETURN 9FT ADLT (ELECTROSURGICAL) ×1
ELECTRODE REM PT RTRN 9FT ADLT (ELECTROSURGICAL) ×1 IMPLANT
GLOVE BIO SURGEON STRL SZ7 (GLOVE) ×2 IMPLANT
GLOVE BIOGEL PI IND STRL 7.5 (GLOVE) ×1 IMPLANT
GOWN STRL REUS W/ TWL LRG LVL3 (GOWN DISPOSABLE) ×2 IMPLANT
GOWN STRL REUS W/TWL LRG LVL3 (GOWN DISPOSABLE) ×2
KIT BASIN OR (CUSTOM PROCEDURE TRAY) ×1 IMPLANT
KIT MARKER MARGIN INK (KITS) ×1 IMPLANT
LIGHT WAVEGUIDE WIDE FLAT (MISCELLANEOUS) IMPLANT
NDL HYPO 25GX1X1/2 BEV (NEEDLE) ×1 IMPLANT
NEEDLE HYPO 25GX1X1/2 BEV (NEEDLE) ×1 IMPLANT
NS IRRIG 1000ML POUR BTL (IV SOLUTION) ×1 IMPLANT
PACK GENERAL/GYN (CUSTOM PROCEDURE TRAY) ×1 IMPLANT
STRIP CLOSURE SKIN 1/2X4 (GAUZE/BANDAGES/DRESSINGS) ×1 IMPLANT
SUT MNCRL AB 4-0 PS2 18 (SUTURE) ×1 IMPLANT
SUT MON AB 5-0 PS2 18 (SUTURE) IMPLANT
SUT SILK 2 0 SH (SUTURE) IMPLANT
SUT VIC AB 2-0 SH 27 (SUTURE) ×1
SUT VIC AB 2-0 SH 27XBRD (SUTURE) ×1 IMPLANT
SUT VIC AB 3-0 SH 27 (SUTURE) ×1
SUT VIC AB 3-0 SH 27X BRD (SUTURE) ×1 IMPLANT
SYR CONTROL 10ML LL (SYRINGE) ×1 IMPLANT
TOWEL GREEN STERILE (TOWEL DISPOSABLE) ×1 IMPLANT
TOWEL GREEN STERILE FF (TOWEL DISPOSABLE) ×1 IMPLANT

## 2022-08-22 NOTE — Anesthesia Procedure Notes (Signed)
Procedure Name: LMA Insertion Date/Time: 08/22/2022 12:21 PM  Performed by: Garfield Cornea, CRNAPre-anesthesia Checklist: Patient identified, Emergency Drugs available, Suction available and Patient being monitored Patient Re-evaluated:Patient Re-evaluated prior to induction Oxygen Delivery Method: Circle System Utilized Preoxygenation: Pre-oxygenation with 100% oxygen Induction Type: IV induction LMA: LMA inserted LMA Size: 4.0 Number of attempts: 1 Placement Confirmation: positive ETCO2 Tube secured with: Tape Dental Injury: Teeth and Oropharynx as per pre-operative assessment

## 2022-08-22 NOTE — Discharge Instructions (Signed)
[facility] Office Phone Number [phone]  POST OP INSTRUCTIONS Take 400 mg of ibuprofen every 8 hours or 650 mg tylenol every 6 hours for next 72 hours then as needed. Use ice several times daily also.  A prescription for pain medication may be given to you upon discharge.  Take your pain medication as prescribed, if needed.  If narcotic pain medicine is not needed, then you may take acetaminophen (Tylenol), naprosyn (Alleve) or ibuprofen (Advil) as needed. Take your usually prescribed medications unless otherwise directed If you need a refill on your pain medication, please contact your pharmacy.  They will contact our office to request authorization.  Prescriptions will not be filled after 5pm or on week-ends. You should eat very light the first 24 hours after surgery, such as soup, crackers, pudding, etc.  Resume your normal diet the day after surgery. Most patients will experience some swelling and bruising in the breast.  Ice packs and a good support bra will help.  Wear the breast binder provided or a sports bra for 72 hours day and night.  After that wear a sports bra during the day until you return to the office. Swelling and bruising can take several days to resolve.  It is common to experience some constipation if taking pain medication after surgery.  Increasing fluid intake and taking a stool softener will usually help or prevent this problem from occurring.  A mild laxative (Milk of Magnesia or Miralax) should be taken according to package directions if there are no bowel movements after 48 hours. I used skin glue on the incision, you may shower in 24 hours.  The glue will flake off over the next 2-3 weeks.  Any sutures or staples will be removed at the office during your follow-up visit. ACTIVITIES:  You may resume regular daily activities (gradually increasing) beginning the next day.  Wearing a good support bra or sports bra minimizes pain and swelling.  You may have  sexual intercourse when it is comfortable. You may drive when you no longer are taking prescription pain medication, you can comfortably wear a seatbelt, and you can safely maneuver your car and apply brakes. RETURN TO WORK:  ______________________________________________________________________________________ You should see your doctor in the office for a follow-up appointment approximately two weeks after your surgery.  Your doctor's nurse will typically make your follow-up appointment when she calls you with your pathology report.  Expect your pathology report 3-4 business days after your surgery.  You may call to check if you do not hear from us after three days. OTHER INSTRUCTIONS: _______________________________________________________________________________________________ _____________________________________________________________________________________________________________________________________ _____________________________________________________________________________________________________________________________________ _____________________________________________________________________________________________________________________________________  WHEN TO CALL DR Ameya Vowell: Fever over 101.0 Nausea and/or vomiting. Extreme swelling or bruising. Continued bleeding from incision. Increased pain, redness, or drainage from the incision.  The clinic staff is available to answer your questions during regular business hours.  Please don't hesitate to call and ask to speak to one of the nurses for clinical concerns.  If you have a medical emergency, go to the nearest emergency room or call 911.  A surgeon from [facility] is always on call at the hospital.  For further questions, please visit [email or web address]  

## 2022-08-22 NOTE — Interval H&P Note (Signed)
History and Physical Interval Note:  08/22/2022 11:53 AM  Stacey Reyes  has presented today for surgery, with the diagnosis of LEFT BREAST ADH.  The various methods of treatment have been discussed with the patient and family. After consideration of risks, benefits and other options for treatment, the patient has consented to  Procedure(s): RADIOACTIVE SEED GUIDED BRACKETED  EXCISIONAL LEFT BREAST BIOPSY (Left) as a surgical intervention.  The patient's history has been reviewed, patient examined, no change in status, stable for surgery.  I have reviewed the patient's chart and labs.  Questions were answered to the patient's satisfaction.     [name]

## 2022-08-22 NOTE — Op Note (Signed)
Preoperative diagnosis: Left breast mass ADH on core Postoperative diagnosis: Same as above Procedure: Left breast radioactive seed bracketed excisional biopsy Surgeon: Dr. Harden Mo Anesthesia: General Estimated blood loss: Minimal Complications: None Drains: None Specimens: Left breast tissue containing seeds and clips marked with paint sent to pathology Sponge needle count was correct at completion Disposition to recovery stable condition   Indications: 77 year old female who is otherwise healthy. She has no prior breast history. She have a basal cell removed on her right breast in the early 90s. She does have a sister with breast cancer. She had no mass or discharge. She underwent a screening mammogram that showed her to have B density breast tissue in the left breast there were an area of calcifications. On magnification views there is a 3.1 cm group of indeterminate calcifications in the lower outer left breast. Both of these were then biopsied. The X clip was fibrocystic changes with UDH and this was recommended for excision. The coil clip was concordant and was ADH and recommended for excision. We discussed excision of both of these areas.      Procedure: After informed consent was obtained she was taken to the operating room.  She was given antibiotics.  SCDs were placed.  She was placed under general anesthesia without complication.  She was prepped and draped in the standard sterile surgical fashion.  A surgical timeout was then performed.   The seed was in the LOQ and I made a curvilinear incision overlying this after infiltrating marcaine.  I then used the neoprobe to dissect to the seeds.   I removed the seeds and some of the surrounding tissue.  Mammogram confirmed removal of the seeds and the clips.  I then obtained hemostasis.  I closed the breast tissue with 2-0 Vicryl.  The skin was closed with 3-0 Vicryl and 4-0 Monocryl.  Glue and Steri-Strips were applied.  She tolerated  well was extubated transferred to recovery stable.

## 2022-08-22 NOTE — Transfer of Care (Signed)
Immediate Anesthesia Transfer of Care Note  Patient: Stacey Reyes  Procedure(s) Performed: RADIOACTIVE SEED GUIDED BRACKETED  EXCISIONAL LEFT BREAST BIOPSY (Left: Breast)  Patient Location: PACU  Anesthesia Type:General  Level of Consciousness: awake, alert , oriented, patient cooperative, and responds to stimulation  Airway & Oxygen Therapy: Patient Spontanous Breathing and Patient connected to nasal cannula oxygen  Post-op Assessment: Report given to RN, Post -op Vital signs reviewed and stable, and Patient moving all extremities X 4  Post vital signs: Reviewed and stable  Last Vitals:  Vitals Value Taken Time  BP    Temp    Pulse    Resp    SpO2      Last Pain:  Vitals:   08/22/22 1019  TempSrc:   PainSc: 2       Patients Stated Pain Goal: 0 (08/22/22 1019)  Complications: No notable events documented.

## 2022-08-23 ENCOUNTER — Encounter (HOSPITAL_COMMUNITY): Payer: Self-pay | Admitting: General Surgery

## 2022-08-23 LAB — URINE CULTURE

## 2022-08-24 LAB — URINE CULTURE

## 2022-08-24 NOTE — Anesthesia Postprocedure Evaluation (Signed)
Anesthesia Post Note  Patient: Stacey Reyes  Procedure(s) Performed: RADIOACTIVE SEED GUIDED BRACKETED  EXCISIONAL LEFT BREAST BIOPSY (Left: Breast)     Patient location during evaluation: PACU Anesthesia Type: General Level of consciousness: awake and alert Pain management: pain level controlled Vital Signs Assessment: post-procedure vital signs reviewed and stable Respiratory status: spontaneous breathing, nonlabored ventilation and respiratory function stable Cardiovascular status: blood pressure returned to baseline and stable Postop Assessment: no apparent nausea or vomiting Anesthetic complications: no   No notable events documented.  Last Vitals:  Vitals:   08/22/22 1330 08/22/22 1345  BP: 135/70 130/66  Pulse: 62 68  Resp: 14 14  Temp: 36.6 C 36.6 C  SpO2: 97% 93%    Last Pain:  Vitals:   08/22/22 1345  TempSrc:   PainSc: 0-No pain                 Nellie Pester

## 2022-08-28 LAB — SURGICAL PATHOLOGY

## 2022-08-28 NOTE — Progress Notes (Signed)
Negative urine culture. Continues with trace lymphocytes and RBCs. Recommend increasing hydration and repeating in a few weeks to make sure hematuria has resolved. If not can follow up with CT to assess for kidney stone.

## 2022-09-06 ENCOUNTER — Inpatient Hospital Stay: Payer: Medicare Other

## 2022-09-06 ENCOUNTER — Inpatient Hospital Stay: Payer: Medicare Other | Attending: Physician Assistant

## 2022-09-06 DIAGNOSIS — E538 Deficiency of other specified B group vitamins: Secondary | ICD-10-CM | POA: Diagnosis not present

## 2022-09-06 DIAGNOSIS — E559 Vitamin D deficiency, unspecified: Secondary | ICD-10-CM

## 2022-09-06 DIAGNOSIS — D500 Iron deficiency anemia secondary to blood loss (chronic): Secondary | ICD-10-CM | POA: Diagnosis not present

## 2022-09-06 LAB — CBC WITH DIFFERENTIAL/PLATELET
Abs Immature Granulocytes: 0.02 10*3/uL (ref 0.00–0.07)
Basophils Absolute: 0.1 10*3/uL (ref 0.0–0.1)
Basophils Relative: 1 %
Eosinophils Absolute: 0.2 10*3/uL (ref 0.0–0.5)
Eosinophils Relative: 2 %
HCT: 37.2 % (ref 36.0–46.0)
Hemoglobin: 12.5 g/dL (ref 12.0–15.0)
Immature Granulocytes: 0 %
Lymphocytes Relative: 19 %
Lymphs Abs: 1.7 10*3/uL (ref 0.7–4.0)
MCH: 29.8 pg (ref 26.0–34.0)
MCHC: 33.6 g/dL (ref 30.0–36.0)
MCV: 88.6 fL (ref 80.0–100.0)
Monocytes Absolute: 0.7 10*3/uL (ref 0.1–1.0)
Monocytes Relative: 8 %
Neutro Abs: 6.3 10*3/uL (ref 1.7–7.7)
Neutrophils Relative %: 70 %
Platelets: 285 10*3/uL (ref 150–400)
RBC: 4.2 MIL/uL (ref 3.87–5.11)
RDW: 13.5 % (ref 11.5–15.5)
WBC: 9.1 10*3/uL (ref 4.0–10.5)
nRBC: 0 % (ref 0.0–0.2)

## 2022-09-06 LAB — IRON AND TIBC
Iron: 47 ug/dL (ref 28–170)
Saturation Ratios: 15 % (ref 10.4–31.8)
TIBC: 321 ug/dL (ref 250–450)
UIBC: 274 ug/dL

## 2022-09-06 LAB — COMPREHENSIVE METABOLIC PANEL
ALT: 15 U/L (ref 0–44)
AST: 26 U/L (ref 15–41)
Albumin: 3.9 g/dL (ref 3.5–5.0)
Alkaline Phosphatase: 43 U/L (ref 38–126)
Anion gap: 10 (ref 5–15)
BUN: 9 mg/dL (ref 8–23)
CO2: 24 mmol/L (ref 22–32)
Calcium: 9 mg/dL (ref 8.9–10.3)
Chloride: 102 mmol/L (ref 98–111)
Creatinine, Ser: 0.65 mg/dL (ref 0.44–1.00)
GFR, Estimated: >60 mL/min (ref >60)
Glucose, Bld: 145 mg/dL — ABNORMAL HIGH (ref 70–99)
Potassium: 3.5 mmol/L (ref 3.5–5.1)
Sodium: 136 mmol/L (ref 135–145)
Total Bilirubin: 0.8 mg/dL (ref 0.3–1.2)
Total Protein: 6.6 g/dL (ref 6.5–8.1)

## 2022-09-06 LAB — FERRITIN: Ferritin: 181 ng/mL (ref 11–307)

## 2022-09-09 ENCOUNTER — Other Ambulatory Visit: Payer: Self-pay | Admitting: Nurse Practitioner

## 2022-09-09 DIAGNOSIS — B3731 Acute candidiasis of vulva and vagina: Secondary | ICD-10-CM

## 2022-09-09 DIAGNOSIS — B379 Candidiasis, unspecified: Secondary | ICD-10-CM

## 2022-09-10 ENCOUNTER — Encounter: Payer: Self-pay | Admitting: Nurse Practitioner

## 2022-09-10 ENCOUNTER — Ambulatory Visit (INDEPENDENT_AMBULATORY_CARE_PROVIDER_SITE_OTHER): Payer: Medicare Other | Admitting: Nurse Practitioner

## 2022-09-10 ENCOUNTER — Ambulatory Visit: Payer: Medicare Other

## 2022-09-10 VITALS — BP 131/80 | HR 86 | Temp 97.5°F | Resp 20 | Ht 66.0 in | Wt 193.0 lb

## 2022-09-10 DIAGNOSIS — R300 Dysuria: Secondary | ICD-10-CM

## 2022-09-10 DIAGNOSIS — N3001 Acute cystitis with hematuria: Secondary | ICD-10-CM

## 2022-09-10 LAB — URINALYSIS, COMPLETE
Bilirubin, UA: NEGATIVE
Ketones, UA: NEGATIVE
Nitrite, UA: POSITIVE — AB
Protein,UA: NEGATIVE
Specific Gravity, UA: <1.005 — ABNORMAL LOW (ref 1.005–1.030)
Urobilinogen, Ur: 1 mg/dL (ref 0.2–1.0)
pH, UA: 6.5 (ref 5.0–7.5)

## 2022-09-10 LAB — MICROSCOPIC EXAMINATION
RBC, Urine: NONE SEEN /hpf (ref 0–2)
Renal Epithel, UA: NONE SEEN /hpf

## 2022-09-10 MED ORDER — FLUCONAZOLE 150 MG PO TABS
ORAL_TABLET | ORAL | 0 refills | Status: DC
Start: 2022-09-10 — End: 2022-10-29

## 2022-09-10 MED ORDER — DOXYCYCLINE HYCLATE 100 MG PO TABS
100.0000 mg | ORAL_TABLET | Freq: Two times a day (BID) | ORAL | 0 refills | Status: DC
Start: 2022-09-10 — End: 2022-11-19

## 2022-09-10 NOTE — Patient Instructions (Signed)
Urinary Tract Infection, Adult A urinary tract infection (UTI) is an infection of any part of the urinary tract. The urinary tract includes: The kidneys. The ureters. The bladder. The urethra. These organs make, store, and get rid of pee (urine) in the body. What are the causes? This infection is caused by germs (bacteria) in your genital area. These germs grow and cause swelling (inflammation) of your urinary tract. What increases the risk? The following factors may make you more likely to develop this condition: Using a small, thin tube (catheter) to drain pee. Not being able to control when you pee or poop (incontinence). Being female. If you are female, these things can increase the risk: Using these methods to prevent pregnancy: A medicine that kills sperm (spermicide). A device that blocks sperm (diaphragm). Having low levels of a female hormone (estrogen). Being pregnant. You are more likely to develop this condition if: You have genes that add to your risk. You are sexually active. You take antibiotic medicines. You have trouble peeing because of: A prostate that is bigger than normal, if you are female. A blockage in the part of your body that drains pee from the bladder. A kidney stone. A nerve condition that affects your bladder. Not getting enough to drink. Not peeing often enough. You have other conditions, such as: Diabetes. A weak disease-fighting system (immune system). Sickle cell disease. Gout. Injury of the spine. What are the signs or symptoms? Symptoms of this condition include: Needing to pee right away. Peeing small amounts often. Pain or burning when peeing. Blood in the pee. Pee that smells bad or not like normal. Trouble peeing. Pee that is cloudy. Fluid coming from the vagina, if you are female. Pain in the belly or lower back. Other symptoms include: Vomiting. Not feeling hungry. Feeling mixed up (confused). This may be the first symptom in  older adults. Being tired and grouchy (irritable). A fever. Watery poop (diarrhea). How is this treated? Taking antibiotic medicine. Taking other medicines. Drinking enough water. In some cases, you may need to see a specialist. Follow these instructions at home:  Medicines Take over-the-counter and prescription medicines only as told by your doctor. If you were prescribed an antibiotic medicine, take it as told by your doctor. Do not stop taking it even if you start to feel better. General instructions Make sure you: Pee until your bladder is empty. Do not hold pee for a long time. Empty your bladder after sex. Wipe from front to back after peeing or pooping if you are a female. Use each tissue one time when you wipe. Drink enough fluid to keep your pee pale yellow. Keep all follow-up visits. Contact a doctor if: You do not get better after 1-2 days. Your symptoms go away and then come back. Get help right away if: You have very bad back pain. You have very bad pain in your lower belly. You have a fever. You have chills. You feeling like you will vomit or you vomit. Summary A urinary tract infection (UTI) is an infection of any part of the urinary tract. This condition is caused by germs in your genital area. There are many risk factors for a UTI. Treatment includes antibiotic medicines. Drink enough fluid to keep your pee pale yellow. This information is not intended to replace advice given to you by your health care provider. Make sure you discuss any questions you have with your health care provider. Document Revised: 10/17/2019 Document Reviewed: 10/22/2019 Elsevier Patient Education    2024 [employer]  

## 2022-09-10 NOTE — Progress Notes (Signed)
Subjective:    Patient ID: Stacey Reyes, female    DOB: 27-Dec-1945, 77 y.o.   MRN: [id number]   Chief Complaint: Dysuria and Muscles hurting all over   Pt was treated for UTI with macrobid 2 weeks ago, states it resolved then she started having symptoms again several days ago.  Dysuria  This is a recurrent problem. The current episode started in the past 7 days. The problem occurs every urination. The problem has been waxing and waning. The quality of the pain is described as burning. The pain is moderate. There has been no fever. She is Not sexually active. There is No history of pyelonephritis. Associated symptoms include frequency and urgency. Pertinent negatives include no chills, discharge, flank pain, hematuria, hesitancy, nausea or vomiting. She has tried antibiotics, increased fluids and acetaminophen (otc AZO) for the symptoms. The treatment provided mild relief. Her past medical history is significant for recurrent UTIs. There is no history of catheterization, kidney stones or a urological procedure.    Patient Active Problem List   Diagnosis Date Noted   GI bleed 12/18/2021   GERD (gastroesophageal reflux disease) 12/18/2021   Gastrointestinal hemorrhage    Hypocalcemia 10/06/2021   Obesity (BMI 30-39.9) 10/06/2021   History of GI bleed 09/25/2021   Hypokalemia 09/16/2021   Elevated troponin 10/20/2020   Symptomatic anemia 10/19/2020   Morning headache 03/08/2020   Hypertrophic cardiomyopathy (HCC) 01/17/2020   Iron deficiency anemia due to chronic blood loss 12/21/2019   Need for immunization against influenza 12/14/2019   Acute blood loss anemia 11/22/2019   Educated about COVID-19 virus infection 07/05/2019   Dyspnea on exertion 07/05/2019   B12 deficiency 03/25/2019   Absolute anemia 03/24/2019   Osteopenia after menopause 04/22/2018   Cardiac murmur due to mitral valve disorder 03/03/2018   Heart murmur 03/03/2018   LVH (left ventricular hypertrophy)  11/20/2015   Arrhythmia 10/11/2015   Hypertension, essential 02/24/2015   Mixed hyperlipidemia 02/24/2015   History of colonic polyps 01/07/2012       Review of Systems  Constitutional:  Negative for appetite change, chills and fever.  Respiratory:  Negative for shortness of breath.   Cardiovascular:  Negative for chest pain.  Gastrointestinal:  Negative for abdominal pain, nausea and vomiting.  Genitourinary:  Positive for dysuria, frequency, pelvic pain and urgency. Negative for difficulty urinating, flank pain, hematuria, hesitancy and vaginal discharge.       Objective:   Physical Exam Vitals and nursing note reviewed.  Constitutional:      General: She is not in acute distress.    Appearance: Normal appearance. She is not ill-appearing.  Cardiovascular:     Rate and Rhythm: Normal rate and regular rhythm.     Pulses: Normal pulses.  Pulmonary:     Effort: Pulmonary effort is normal. No respiratory distress.     Breath sounds: Normal breath sounds. No wheezing, rhonchi or rales.  Abdominal:     General: Abdomen is flat. Bowel sounds are normal. There is no distension.     Palpations: Abdomen is soft.     Tenderness: There is no abdominal tenderness. There is no right CVA tenderness or left CVA tenderness.  Skin:    General: Skin is warm and dry.  Neurological:     General: No focal deficit present.     Mental Status: She is alert and oriented to person, place, and time.  Psychiatric:        Mood and Affect: Mood normal.  Behavior: Behavior normal.      BP 131/80   Pulse 86   Temp (!) 97.5 F (36.4 C) (Temporal)   Resp 20   Ht 5\' 6"  (1.676 m)   Wt 193 lb (87.5 kg)   SpO2 97%   BMI 31.15 kg/m       Assessment & Plan:  Stacey Reyes in today with chief complaint of Dysuria and Muscles hurting all over   1. Dysuria - Urinalysis, Complete - Urine Culture  2. Acute cystitis with hematuria Take medication as prescribe Cotton underwear Take  shower not bath Cranberry juice, yogurt Force fluids AZO over the counter X2 days Culture pending RTO prn  Meds ordered this encounter  Medications   doxycycline (VIBRA-TABS) 100 MG tablet    Sig: Take 1 tablet (100 mg total) by mouth 2 (two) times daily. 1 po bid    Dispense:  20 tablet    Refill:  0    Order Specific Question:   Supervising Provider    Answer:   Arville Care A [[id number]]   fluconazole (DIFLUCAN) 150 MG tablet    Sig: 1 po q week x 4 weeks    Dispense:  4 tablet    Refill:  0    Order Specific Question:   Supervising Provider    Answer:   Arville Care A [[id number]]       The above assessment and management plan was discussed with the patient. The patient verbalized understanding of and has agreed to the management plan. Patient is aware to call the clinic if symptoms persist or worsen. Patient is aware when to return to the clinic for a follow-up visit. Patient educated on when it is appropriate to go to the emergency department.   Mary-Amyrie Daphine Deutscher, FNP

## 2022-09-10 NOTE — Progress Notes (Unsigned)
[facility] [address], [place] [address]   CLINIC:  Medical Oncology/Hematology  PCP:  Raliegh Ip, DO [address] [place] [address] [phone]   REASON FOR VISIT:  Follow-up for iron deficiency   CURRENT THERAPY: Intermittent IV iron infusions and blood transfusions  INTERVAL HISTORY:   Stacey Reyes 77 y.o. female returns for routine follow-up of her iron deficiency anemia.  She was last evaluated via telemedicine visit by Rojelio Brenner PA-C on 06/07/2022.  At today's visit, she reports that she is feeling poorly.  She reports frequent urinary tract infections as well as headache, neck stiffness, and diffuse muscle aches after being placed on antibiotics.  She had breast biopsy on 08/22/2022 and developed postoperative hematoma that has been draining profusely over the past 4 days.  She reports increased fatigue and generalized malaise.   She has not noticed any recurrent melena or hematochezia.  No pica, lightheadedness, or syncope.  No chest pain or difficulty breathing.   She stoppped taking iron pill after surgery on 08/22/22.  She has little to no energy and 75% appetite. She endorses that she is maintaining a stable weight.   ASSESSMENT & PLAN:  1.  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  No improvement despite taking iron tablet twice daily - Patient has had multiple hospitalizations within the past year for acute blood loss anemia, is followed closely by gastroenterology - She had two hospitalizations in September 2022, and received a total of 8 units PRBC during these hospital stays.  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 - Three hospitalizations in June/July 2023, total of 4 units PRBC during his hospital stays.   - MOST RECENT HOSPITAL STAY  (12/18/2021 - 12/20/2021): Acute blood loss anemia with extreme fatigue and dyspnea on exertion.  She was found to have Hgb 7.8, which had dropped from 11.2 in the course of 1 month.  FOBT was positive.  She received 2 units PRBC during hospitalization.  EGD (12/19/2021) showed Dieulafoy lesion in her stomach.  Hgb at the time of discharge was 9.0. - Most recent IV Feraheme on 10/22/2021 and 10/29/2021 - She has not had any evidence of recurrent GI bleeding since her most recent hospitalization in September 2023, but did have left breast hematoma and bleeding following biopsy on 08/22/2022 - Most recent labs (09/06/2022): Hgb 12.5/MCV 88.6, ferritin 181, iron saturation 15%. - PLAN: No indication for IV iron at this time.  Continue daily iron supplement. - We will check CBC today due to recent postoperative hematoma/blood loss.  We will recheck labs and iron panel in 1 month. - Repeat labs and RTC 4 months with PHONE visit   - Patient is aware of alarm symptoms that would prompt immediate medical attention.   2.  B12 deficiency - She is taking B12 tablet 1000 mcg daily - Most recent labs (12/18/2021): Vitamin B12 normal at 341 with normal MMA - PLAN: Continue B12 supplement.  We will recheck levels annually (next in September 2024)  3.  OTHER ISSUES - FREQUENT UTIs: Advised to discuss this with her primary care provider regarding potential need for urology referral. - HEADACHES AND BODYACHES: Advised to discuss this with her primary care provider. - POSTOPERATIVE HEMATOMA OF LEFT BREAST WITH ONGOING BLEEDING: Her surgeons office is closed today, but  she is advised to call her surgeon to discuss this first thing in the morning.  PLAN SUMMARY: >> Labs today = CBC >> Labs in 1 month = CBC, ferritin, iron/TIBC >> Labs in 4 months = CBC/D, CMP, ferritin, iron/TIBC, B12, MMA >> PHONE visit in 4 months     REVIEW OF SYSTEMS:   Review of Systems  Constitutional:  Positive for fatigue. Negative for  appetite change, chills, diaphoresis, fever and unexpected weight change.  HENT:   Negative for lump/mass and nosebleeds.   Eyes:  Negative for eye problems.  Respiratory:  Negative for cough, hemoptysis and shortness of breath.   Cardiovascular:  Negative for chest pain, leg swelling and palpitations.  Gastrointestinal:  Negative for abdominal pain, blood in stool, constipation, diarrhea, nausea and vomiting.  Genitourinary:  Positive for dysuria (frequent UTI). Negative for hematuria.   Musculoskeletal:  Positive for myalgias, neck pain and neck stiffness.  Skin:  Positive for wound (post-op hematoma of left breast).  Neurological:  Positive for headaches. Negative for dizziness and light-headedness.  Hematological:  Does not bruise/bleed easily.     PHYSICAL EXAM:  ECOG PERFORMANCE STATUS: 1 - Symptomatic but completely ambulatory  There were no vitals filed for this visit. There were no vitals filed for this visit. Physical Exam Constitutional:      Appearance: Normal appearance. She is obese.  Cardiovascular:     Heart sounds: Murmur heard.  Pulmonary:     Breath sounds: Normal breath sounds.  Chest:     Comments: Bruising and edema of left breast consistent with postoperative hematoma. She has an open incision that is draining thick reddish-brown discharge.  No pus or fresh blood is noted. Neurological:     General: No focal deficit present.     Mental Status: Mental status is at baseline.  Psychiatric:        Behavior: Behavior normal. Behavior is cooperative.     PAST MEDICAL/SURGICAL HISTORY:  Past Medical History:  Diagnosis Date   Anemia    Asymmetric septal hypertrophy (HCC)    cMRI 07/2019: asymmetric hypertrophy measuring up to 25mm in basal septum (11mm in posterior wall), c/w hypertrophic CM, LVOT obstruction d/t SAM of the MV anterior leaflet, c/w HOCM ; no identifiable gene   Blood transfusion without reported diagnosis    Cataract    removed bilateral    Chronic cystitis    Diverticulitis    GERD (gastroesophageal reflux disease)    Heart murmur    Hyperlipidemia    Hypertension    Personal history of colonic polyps-adenomas 01/07/2012   2009 - 2 diminutive adenomas (prior polyps also) 01/07/2012 - 2 diminutive adenomas     Past Surgical History:  Procedure Laterality Date   BREAST BIOPSY Right    No Scar seen    BREAST BIOPSY Left 05/10/2022   MM LT BREAST BX W LOC DEV EA AD LESION IMG BX SPEC STEREO GUIDE 05/10/2022 [facility]   BREAST BIOPSY Left 05/10/2022   MM LT BREAST BX W LOC DEV 1ST LESION IMAGE BX SPEC STEREO GUIDE 05/10/2022 [facility]   BREAST BIOPSY  08/20/2022   MM LT RADIOACTIVE SEED EA ADD LESION LOC MAMMO GUIDE 08/20/2022 [facility]   BREAST BIOPSY  08/20/2022   MM LT RADIOACTIVE SEED LOC MAMMO GUIDE 08/20/2022 [facility]   CATARACT EXTRACTION Bilateral    COLONOSCOPY  01/2020   Dr. Rhea Belton; Five 3 to 9 mm polyps in the ascending colon, Two 4 to 5 mm polyps  in the transverse colon, diverticulosis, eryethema and petechia in left colon biopsied, external and internal hemorrhoids. Pathology with tubular adenomas. Left colon biopsy was benign. Recommended 3 year surveillance.   ENTEROSCOPY N/A 09/26/2021   Procedure: ENTEROSCOPY;  Surgeon: Iva Boop, MD;  Location: [facility] ENDOSCOPY;  Service: Gastroenterology;  Laterality: N/A;   ENTEROSCOPY N/A 12/19/2021   Procedure: ENTEROSCOPY;  Surgeon: Corbin Ade, MD;  Location: [facility]O SU[facility];  Service: Endoscopy;  Laterality: N/A;   ESOPHAGOGASTRODUODENOSCOPY N/A 10/07/2021   Procedure: ESOPHAGOGASTRODUODENOSCOPY (EGD);  Surgeon: Corbin Ade, MD;  Location: [facility];  Service: Endoscopy;  Laterality: N/A;  EGD with enteroscopy  with pediatric colonoscope   ESOPHAGOGASTRODUODENOSCOPY (EGD) WITH PROPOFOL N/A 11/23/2019   Procedure: ESOPHAGOGASTRODUODENOSCOPY (EGD) WITH PROPOFOL;  Surgeon: Corbin Ade, MD;  Location: [facility];  Service:  Endoscopy;  Laterality: N/A;   ESOPHAGOGASTRODUODENOSCOPY (EGD) WITH PROPOFOL N/A 10/20/2020   Procedure: ESOPHAGOGASTRODUODENOSCOPY (EGD) WITH PROPOFOL;  Surgeon: Lanelle Bal, DO;  Location: [facility];  Service: Endoscopy;  Laterality: N/A;   ESOPHAGOGASTRODUODENOSCOPY (EGD) WITH PROPOFOL N/A 12/05/2020   Procedure: ESOPHAGOGASTRODUODENOSCOPY (EGD) WITH PROPOFOL;  Surgeon: Malissa Hippo, MD;  Location: [facility];  Service: Endoscopy;  Laterality: N/A;  with enteroscopy   ESOPHAGOGASTRODUODENOSCOPY (EGD) WITH PROPOFOL N/A 12/11/2020   Procedure: ESOPHAGOGASTRODUODENOSCOPY (EGD) WITH PROPOFOL;  Surgeon: Lanelle Bal, DO;  Location: [facility];  Service: Endoscopy;  Laterality: N/A;   ESOPHAGOGASTRODUODENOSCOPY (EGD) WITH PROPOFOL N/A 09/17/2021   Procedure: ESOPHAGOGASTRODUODENOSCOPY (EGD) WITH PROPOFOL;  Surgeon: Lanelle Bal, DO;  [facility];  Service: Endoscopy;  Laterality: N/A;   ESOPHAGOGASTRODUODENOSCOPY (EGD) WITH PROPOFOL N/A 12/19/2021   Procedure: ESOPHAGOGASTRODUODENOSCOPY (EGD) WITH PROPOFOL;  Surgeon: Corbin Ade, MD;  [facility]: [facility];  Service: Endoscopy;  Laterality: N/A;  with pediatric scope   GIVENS CAPSULE STUDY N/A 11/23/2019   Procedure: GIVENS CAPSULE STUDY;  Surgeon: Corbin Ade, MD;  [facility]: [facility];  Service: Endoscopy;  Laterality: N/A;   GIVENS CAPSULE STUDY N/A 10/22/2020   Procedure: GIVENS CAPSULE STUDY;  Surgeon: Lanelle Bal, DO;  Location: [facility];  Service: Endoscopy;  Laterality: N/A;   GIVENS CAPSULE STUDY N/A 09/26/2021   Procedure: GIVENS CAPSULE STUDY;  Surgeon: Iva Boop, MD;  Location: [facility] ENDOSCOPY;  Service: Gastroenterology;  Laterality: N/A;   HEMOSTASIS CLIP PLACEMENT  12/11/2020   Procedure: HEMOSTASIS CLIP PLACEMENT;  Surgeon: Lanelle Bal, DO;  Location: [facility];  Service: Endoscopy;;   IR ANGIOGRAM SELECTIVE EACH ADDITIONAL VESSEL  12/05/2020   IR  ANGIOGRAM SELECTIVE EACH ADDITIONAL VESSEL  12/05/2020   IR ANGIOGRAM SELECTIVE EACH ADDITIONAL VESSEL  12/05/2020   IR ANGIOGRAM SELECTIVE EACH ADDITIONAL VESSEL  12/13/2020   IR ANGIOGRAM SELECTIVE EACH ADDITIONAL VESSEL  12/13/2020   IR ANGIOGRAM VISCERAL SELECTIVE  12/05/2020   IR ANGIOGRAM VISCERAL SELECTIVE  12/13/2020   IR EMBO ART  VEN HEMORR LYMPH EXTRAV  INC GUIDE ROADMAPPING  12/05/2020   IR EMBO ART  VEN HEMORR LYMPH EXTRAV  INC GUIDE ROADMAPPING  12/13/2020   IR RADIOLOGIST EVAL & MGMT  01/11/2021   IR US GUIDE VASC ACCESS LEFT  12/13/2020   IR US GUIDE VASC ACCESS RIGHT  12/05/2020   RADIOACTIVE SEED GUIDED EXCISIONAL BREAST BIOPSY Left 08/22/2022   Procedure: RADIOACTIVE SEED GUIDED BRACKETED  EXCISIONAL LEFT BREAST BIOPSY;  Surgeon: [name], MD;  Location: [facility] OR;  Service: General;  Laterality: Left;   SUBMUCOSAL TATTOO  INJECTION  09/26/2021   Procedure: SUBMUCOSAL TATTOO INJECTION;  Surgeon: Iva Boop, MD;  Location: [facility] ENDOSCOPY;  Service: Gastroenterology;;    SOCIAL HISTORY:  Social History   Socioeconomic History   Marital status: Married    Spouse name: Not on file   Number of children: 1   Years of education: Not on file   Highest education level: Not on file  Occupational History   Occupation: retired  Tobacco Use   Smoking status: Former    Types: Cigarettes    Quit date: 12/23/1984    Years since quitting: 37.7   Smokeless tobacco: Never  Vaping Use   Vaping Use: Never used  Substance and Sexual Activity   Alcohol use: Yes    Comment: wine occasionally, maybe twice a month   Drug use: No   Sexual activity: Not on file  Other Topics Concern   Not on file  Social History Narrative   Patient is married and retired and has 1 grown child   Social Determinants of Corporate investment banker Strain: Low Risk  (03/28/2022)   Overall Financial Resource Strain (CARDIA)    Difficulty of Paying Living Expenses: Not hard at all  Food  Insecurity: No Food Insecurity (03/28/2022)   Hunger Vital Sign    Worried About Running Out of Food in the Last Year: Never true    Ran Out of Food in the Last Year: Never true  Transportation Needs: No Transportation Needs (03/28/2022)   PRAPARE - Administrator, Civil Service (Medical): No    Lack of Transportation (Non-Medical): No  Physical Activity: Insufficiently Active (03/28/2022)   Exercise Vital Sign    Days of Exercise per Week: 3 days    Minutes of Exercise per Session: 30 min  Stress: No Stress Concern Present (03/28/2022)   [employer] of Occupational Health - Occupational Stress Questionnaire    Feeling of Stress : Not at all  Social Connections: Moderately Isolated (03/28/2022)   Social Connection and Isolation Panel [NHANES]    Frequency of Communication with Friends and Family: More than three times a week    Frequency of Social Gatherings with Friends and Family: More than three times a week    Attends Religious Services: Never    Database administrator or Organizations: No    Attends Banker Meetings: Never    Marital Status: Married  Catering manager Violence: Not At Risk (03/28/2022)   Humiliation, Afraid, Rape, and Kick questionnaire    Fear of Current or Ex-Partner: No    Emotionally Abused: No    Physically Abused: No    Sexually Abused: No    FAMILY HISTORY:  Family History  Problem Relation Age of Onset   Colon cancer Mother 34       13's   Breast cancer Sister    Asthma Brother    Colon polyps Neg Hx    Esophageal cancer Neg Hx    Rectal cancer Neg Hx    Stomach cancer Neg Hx     CURRENT MEDICATIONS:  Outpatient Encounter Medications as of 09/11/2022  Medication Sig   acetaminophen (TYLENOL) 325 MG tablet Take 325 mg by mouth every 6 (six) hours as needed for moderate pain.   Ascorbic Acid (VITAMIN C PO) Take 1 tablet by mouth daily.   bisoprolol (ZEBETA) 5 MG tablet TAKE ONE-HALF TABLET BY MOUTH  DAILY   CALCIUM PO Take  1 tablet by mouth daily.   Cholecalciferol (  VITAMIN D-3 PO) Take 1 capsule by mouth daily.   COPPER PO Take 1 capsule by mouth daily.   cyanocobalamin (CYANOCOBALAMIN) 500 MCG tablet Take 500 mcg by mouth daily.   doxycycline (VIBRA-TABS) 100 MG tablet Take 1 tablet (100 mg total) by mouth 2 (two) times daily. 1 po bid   ferrous sulfate ([employer] IRON) 325 (65 FE) MG tablet TAKE 1 TABLET BY MOUTH EVERY DAY   fluconazole (DIFLUCAN) 150 MG tablet 1 po q week x 4 weeks   levocetirizine (XYZAL) 5 MG tablet TAKE 1 TABLET (5 MG TOTAL) BY MOUTH AT BEDTIME AS NEEDED FOR ALLERGIES (DRAINAGE).   MAGNESIUM PO Take 1 tablet by mouth daily.   pantoprazole (PROTONIX) 40 MG tablet Take 1 tablet (40 mg total) by mouth 2 (two) times daily. (Patient taking differently: Take 40 mg by mouth daily.)   rosuvastatin (CRESTOR) 10 MG tablet TAKE 1 TABLET BY MOUTH AT  BEDTIME   Vitamin D, Ergocalciferol, (DRISDOL) 1.25 MG (50000 UNIT) CAPS capsule TAKE 1 CAPSULE BY MOUTH  ONCE WEEKLY   VITAMIN E PO Take 1 tablet by mouth daily.   [DISCONTINUED] fluconazole (DIFLUCAN) 150 MG tablet Take 1 tablet (150 mg total) by mouth every three (3) days as needed.   No facility-administered encounter medications on file as of 09/11/2022.    ALLERGIES:  Allergies  Allergen Reactions   Ciprofloxacin    Penicillin G Hives   Sulfa Antibiotics Rash    LABORATORY DATA:  I have reviewed the labs as listed.  CBC    Component Value Date/Time   WBC 9.1 09/06/2022 1402   RBC 4.20 09/06/2022 1402   HGB 12.5 09/06/2022 1402   HGB 13.5 05/21/2022 1603   HCT 37.2 09/06/2022 1402   HCT 39.4 05/21/2022 1603   PLT 285 09/06/2022 1402   PLT 249 05/21/2022 1603   MCV 88.6 09/06/2022 1402   MCV 90 05/21/2022 1603   MCH 29.8 09/06/2022 1402   MCHC 33.6 09/06/2022 1402   RDW 13.5 09/06/2022 1402   RDW 12.4 05/21/2022 1603   LYMPHSABS 1.7 09/06/2022 1402   LYMPHSABS 2.1 05/21/2022 1603   MONOABS 0.7 09/06/2022 1402   EOSABS 0.2 09/06/2022  1402   EOSABS 0.4 05/21/2022 1603   BASOSABS 0.1 09/06/2022 1402   BASOSABS 0.1 05/21/2022 1603      Latest Ref Rng & Units 09/06/2022    2:02 PM 08/15/2022   12:05 PM 05/31/2022   12:24 PM  CMP  Glucose 70 - 99 mg/dL 829  98  562   BUN 8 - 23 mg/dL 9  7  10    Creatinine 0.44 - 1.00 mg/dL 1.30  8.65  7.84   Sodium 135 - 145 mmol/L 136  135  136   Potassium 3.5 - 5.1 mmol/L 3.5  3.9  3.5   Chloride 98 - 111 mmol/L 102  99  103   CO2 22 - 32 mmol/L 24  27  25    Calcium 8.9 - 10.3 mg/dL 9.0  9.3  8.7   Total Protein 6.5 - 8.1 g/dL 6.6   6.5   Total Bilirubin 0.3 - 1.2 mg/dL 0.8   0.6   Alkaline Phos 38 - 126 U/L 43   45   AST 15 - 41 U/L 26   24   ALT 0 - 44 U/L 15   18     DIAGNOSTIC IMAGING:  I have independently reviewed the relevant imaging and discussed with the patient.   WRAP UP:  All questions were answered. The patient knows to call the clinic with any problems, questions or concerns.  Medical decision making: Moderate  Time spent on visit: I spent 20 minutes counseling the patient face to face. The total time spent in the appointment was 30 minutes and more than 50% was on counseling.  Carnella Guadalajara, PA-C  09/11/22 1:42 PM

## 2022-09-11 ENCOUNTER — Inpatient Hospital Stay: Payer: Medicare Other

## 2022-09-11 ENCOUNTER — Ambulatory Visit: Payer: Medicare Other | Admitting: Nurse Practitioner

## 2022-09-11 ENCOUNTER — Inpatient Hospital Stay: Payer: Medicare Other | Admitting: Physician Assistant

## 2022-09-11 VITALS — BP 138/94 | HR 85 | Temp 98.9°F | Resp 16 | Wt 192.0 lb

## 2022-09-11 DIAGNOSIS — D500 Iron deficiency anemia secondary to blood loss (chronic): Secondary | ICD-10-CM

## 2022-09-11 DIAGNOSIS — E538 Deficiency of other specified B group vitamins: Secondary | ICD-10-CM | POA: Diagnosis not present

## 2022-09-11 DIAGNOSIS — D62 Acute posthemorrhagic anemia: Secondary | ICD-10-CM

## 2022-09-11 LAB — CBC
HCT: 37.3 % (ref 36.0–46.0)
Hemoglobin: 12.2 g/dL (ref 12.0–15.0)
MCH: 29.1 pg (ref 26.0–34.0)
MCHC: 32.7 g/dL (ref 30.0–36.0)
MCV: 89 fL (ref 80.0–100.0)
Platelets: 272 10*3/uL (ref 150–400)
RBC: 4.19 MIL/uL (ref 3.87–5.11)
RDW: 13.4 % (ref 11.5–15.5)
WBC: 8.8 10*3/uL (ref 4.0–10.5)
nRBC: 0 % (ref 0.0–0.2)

## 2022-09-11 LAB — URINE CULTURE

## 2022-09-11 NOTE — Patient Instructions (Signed)
[facility] **VISIT SUMMARY & IMPORTANT INSTRUCTIONS **   You were seen today by Rojelio Brenner PA-C for your iron deficiency anemia.    IRON DEFICIENCY ANEMIA: Your labs last week showed good hemoglobin and iron levels. We will check blood count today to make sure you did not lose too much blood following your postoperative hematoma and bleeding from your left breast.  OTHER ISSUES Please discuss with your primary care doctor regarding your frequent urinary tract infections, headaches, and bodyaches. Please discuss with your surgeon ASAP regarding the ongoing bleeding from your left breast hematoma. If you have any severe/rapid bleeding or symptoms of severe weakness, lightheadedness, passing out, difficulty breathing, or chest pain, then go immediately to the Emergency Department.  MEDICATIONS: - RESTART iron supplement (ferrous sulfate 325 mg) daily - RESTART vitamin B12 supplement (1000 mcg) daily  FOLLOW-UP APPOINTMENT: Labs and phone visit in 4 months  ** Thank you for trusting me with your healthcare!  I strive to provide all of my patients with quality care at each visit.  If you receive a survey for this visit, I would be so grateful to you for taking the time to provide feedback.  Thank you in advance!  ~ Nuvia Hileman                   Dr. Doreatha Massed   &   Rojelio Brenner, PA-C   - - - - - - - - - - - - - - - - - -    Thank you for choosing [facility] at [facility] to provide your oncology and hematology care.  To afford each patient quality time with our provider, please arrive at least 15 minutes before your scheduled appointment time.   If you have a lab appointment with the Cancer Center please come in thru the Main Entrance and check in at the main information desk.  You need to re-schedule your appointment should you arrive 10 or more minutes late.  We strive to give you quality time with our  providers, and arriving late affects you and other patients whose appointments are after yours.  Also, if you no show three or more times for appointments you may be dismissed from the clinic at the providers discretion.     Again, thank you for choosing [facility].  Our hope is that these requests will decrease the amount of time that you wait before being seen by our physicians.       _____________________________________________________________  Should you have questions after your visit to [facility], please contact our office at [phone] and follow the prompts.  Our office hours are 8:00 a.m. and 4:30 p.m. Monday - Friday.  Please note that voicemails left after 4:00 p.m. may not be returned until the following business day.  We are closed weekends and major holidays.  You do have access to a nurse 24-7, just call the main number to the clinic [phone] and do not press any options, hold on the line and a nurse will answer the phone.    For prescription refill requests, have your pharmacy contact our office and allow 72 hours.

## 2022-09-24 ENCOUNTER — Ambulatory Visit: Payer: Medicare Other | Admitting: Gastroenterology

## 2022-09-24 ENCOUNTER — Encounter: Payer: Self-pay | Admitting: Gastroenterology

## 2022-09-24 VITALS — BP 126/70 | HR 71 | Temp 97.8°F | Ht 65.5 in | Wt 190.8 lb

## 2022-09-24 DIAGNOSIS — K219 Gastro-esophageal reflux disease without esophagitis: Secondary | ICD-10-CM | POA: Diagnosis not present

## 2022-09-24 DIAGNOSIS — K31819 Angiodysplasia of stomach and duodenum without bleeding: Secondary | ICD-10-CM

## 2022-09-24 DIAGNOSIS — D500 Iron deficiency anemia secondary to blood loss (chronic): Secondary | ICD-10-CM | POA: Diagnosis not present

## 2022-09-24 DIAGNOSIS — Z86010 Personal history of colonic polyps: Secondary | ICD-10-CM

## 2022-09-24 NOTE — Progress Notes (Signed)
GI Office Note    Referring Provider: Raliegh Ip, DO Primary Care Physician:  Stacey Ip, DO Primary Gastroenterologist: Stacey Reyes. Stacey Lor, DO  Date:  09/24/2022  ID:  Stacey Reyes, DOB 02-17-1946, MRN [id number]   Chief Complaint   Chief Complaint  Patient presents with   Follow-up    Follow up dark stools    History of Present Illness  Stacey Reyes is a 77 y.o. female with a history of IDA in the setting of GI bleeding secondary to known AVMs with multiple prior endoscopic including EGDs, colonoscopies, capsule studies, and IR interventions, diverticulitis, GERD, HLD, HTN presenting today with complaint of dark stools.  Colonoscopy in November 2021 performed by Dr. 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  She had done well after this until the middle of 2023 where she developed recurrent melena, acute on chronic anemia requiring admission and transfusion where she underwent EGD/enteroscopy and capsule study in early July with no suspected lesion identified.  She was admitted in July 2023 with melena and symptomatic anemia and underwent EGD 7/16 with actively bleeding distal duodenal mucosa consistent with dieulafoy s/p placement of 4 hemostatic clips and she received 2 units PBC send admission.  Unremarkable EGD/enteroscopy in September 2023.  Has history of following with hematology for IV iron as needed and maintained on oral iron.  Last office visit 01/25/2022.  Patient reported that she felt great, having a lot of energy but did note dark stools related to iron therapy.  She was advised to continue to follow with hematology and continue oral iron therapy.  Advise she was due  for colonoscopy in 2024 and advised to discuss at follow-up visit.  GERD well-controlled with pantoprazole, could try once a day rather than twice daily.  Advised follow-up in 6 months.  Labs 09/11/2022: Hemoglobin 12.2, MCV 89, platelets 272.  Recently seen hematology who recommended follow-up labs in 1 month given she was recently postop with some mild blood loss/hematoma of left breast.  Today:  She states she has not had dark stools for a while. Sates her stools are darker when it is more watery. She has more bristol 4 stools more often than the watery stools. Her normal is a BM in the morning but if she goes a second time it is usually more watery. The watery type stools tend to be after she eats a meal at times. No abdominal pain, melena. Losing some old blood from her breast procedure.   Taking small dose iron once daily and then once a week she takes a larger dose. She used to check her hgb regularly [facility] if she felt like she needed it. No PICA, dizziness, lightheadedness, chest pain, shortness of breath. Normally has good energy.   She has been on a medication for UTI recently on and off since February. Her last Abx was doxycycline. Hgb has come down from 14 to 12.5, but in the past she would have very large drops.  Still having a little discolored drainage from her left breast. Had to pack her wound for a little while.   No issues with acid reflux, n/v, dysphagia.   Has some intermittent constipation and diarrhea.   Current Outpatient Medications  Medication Sig Dispense  Refill   acetaminophen (TYLENOL) 325 MG tablet Take 325 mg by mouth every 6 (six) hours as needed for moderate pain.     Ascorbic Acid (VITAMIN C PO) Take 1 tablet by mouth daily.     bisoprolol (ZEBETA) 5 MG tablet TAKE ONE-HALF TABLET BY MOUTH  DAILY 45 tablet 3   CALCIUM PO Take 1 tablet by mouth daily.     Cholecalciferol (VITAMIN D-3 PO) Take 1 capsule by mouth daily.     COPPER PO Take 1 capsule by  mouth daily.     ferrous sulfate ([employer] IRON) 325 (65 FE) MG tablet TAKE 1 TABLET BY MOUTH EVERY DAY 90 tablet 3   Vitamin D, Ergocalciferol, (DRISDOL) 1.25 MG (50000 UNIT) CAPS capsule TAKE 1 CAPSULE BY MOUTH  ONCE WEEKLY 15 capsule 2   VITAMIN E PO Take 1 tablet by mouth daily.     cyanocobalamin (CYANOCOBALAMIN) 500 MCG tablet Take 500 mcg by mouth daily. (Patient not taking: Reported on 09/11/2022)     doxycycline (VIBRA-TABS) 100 MG tablet Take 1 tablet (100 mg total) by mouth 2 (two) times daily. 1 po bid (Patient not taking: Reported on 09/24/2022) 20 tablet 0   fluconazole (DIFLUCAN) 150 MG tablet 1 po q week x 4 weeks (Patient not taking: Reported on 09/11/2022) 4 tablet 0   levocetirizine (XYZAL) 5 MG tablet TAKE 1 TABLET (5 MG TOTAL) BY MOUTH AT BEDTIME AS NEEDED FOR ALLERGIES (DRAINAGE). (Patient not taking: Reported on 09/11/2022) 90 tablet 1   MAGNESIUM PO Take 1 tablet by mouth daily. (Patient not taking: Reported on 09/11/2022)     pantoprazole (PROTONIX) 40 MG tablet Take 1 tablet (40 mg total) by mouth 2 (two) times daily. (Patient not taking: Reported on 09/11/2022) 180 tablet 0   rosuvastatin (CRESTOR) 10 MG tablet TAKE 1 TABLET BY MOUTH AT  BEDTIME (Patient not taking: Reported on 09/11/2022) 100 tablet 2   No current facility-administered medications for this visit.    Past Medical History:  Diagnosis Date   Anemia    Asymmetric septal hypertrophy (HCC)    cMRI 07/2019: asymmetric hypertrophy measuring up to 25mm in basal septum (11mm in posterior wall), c/w hypertrophic CM, LVOT obstruction d/t SAM of the MV anterior leaflet, c/w HOCM ; no identifiable gene   Blood transfusion without reported diagnosis    Cataract    removed bilateral   Chronic cystitis    Diverticulitis    GERD (gastroesophageal reflux disease)    Heart murmur    Hyperlipidemia    Hypertension    Personal history of colonic polyps-adenomas 01/07/2012   2009 - 2 diminutive adenomas (prior polyps also)  01/07/2012 - 2 diminutive adenomas      Past Surgical History:  Procedure Laterality Date   BREAST BIOPSY Right    No Scar seen    BREAST BIOPSY Left 05/10/2022   MM LT BREAST BX W LOC DEV EA AD LESION IMG BX SPEC STEREO GUIDE 05/10/2022 [facility]   BREAST BIOPSY Left 05/10/2022   MM LT BREAST BX W LOC DEV 1ST LESION IMAGE BX SPEC STEREO GUIDE 05/10/2022 [facility]   BREAST BIOPSY  08/20/2022   MM LT RADIOACTIVE SEED EA ADD LESION LOC MAMMO GUIDE 08/20/2022 [facility]   BREAST BIOPSY  08/20/2022   MM LT RADIOACTIVE SEED LOC MAMMO GUIDE 08/20/2022 [facility]   CATARACT EXTRACTION Bilateral    COLONOSCOPY  01/2020   Dr. Rhea Reyes; Five 3 to 9 mm polyps in the ascending colon,  Two 4 to 5 mm polyps in the transverse colon, diverticulosis, eryethema and petechia in left colon biopsied, external and internal hemorrhoids. Pathology with tubular adenomas. Left colon biopsy was benign. Recommended 3 year surveillance.   ENTEROSCOPY N/A 09/26/2021   Procedure: ENTEROSCOPY;  Surgeon: Iva Boop, MD;  Location: [facility] ENDOSCOPY;  Service: Gastroenterology;  Laterality: N/A;   ENTEROSCOPY N/A 12/19/2021   Procedure: ENTEROSCOPY;  Surgeon: Corbin Ade, MD;  Location: [facility]O [facility];  Service: Endoscopy;  Laterality: N/A;   ESOPHAGOGASTRODUODENOSCOPY N/A 10/07/2021   Procedure: ESOPHAGOGASTRODUODENOSCOPY (EGD);  Surgeon: Corbin Ade, MD;  Location: [facility];  Service: Endoscopy;  Laterality: N/A;  EGD with enteroscopy  with pediatric colonoscope   ESOPHAGOGASTRODUODENOSCOPY (EGD) WITH PROPOFOL N/A 11/23/2019   Procedure: ESOPHAGOGASTRODUODENOSCOPY (EGD) WITH PROPOFOL;  Surgeon: Corbin Ade, MD;  Location: [facility];  Service: Endoscopy;  Laterality: N/A;   ESOPHAGOGASTRODUODENOSCOPY (EGD) WITH PROPOFOL N/A 10/20/2020   Procedure: ESOPHAGOGASTRODUODENOSCOPY (EGD) WITH PROPOFOL;  Surgeon: Lanelle Bal, DO;  Location: [facility];  Service: Endoscopy;   Laterality: N/A;   ESOPHAGOGASTRODUODENOSCOPY (EGD) WITH PROPOFOL N/A 12/05/2020   Procedure: ESOPHAGOGASTRODUODENOSCOPY (EGD) WITH PROPOFOL;  Surgeon: Malissa Hippo, MD;  Location: [facility];  Service: Endoscopy;  Laterality: N/A;  with enteroscopy   ESOPHAGOGASTRODUODENOSCOPY (EGD) WITH PROPOFOL N/A 12/11/2020   Procedure: ESOPHAGOGASTRODUODENOSCOPY (EGD) WITH PROPOFOL;  Surgeon: Lanelle Bal, DO;  [facility];  Service: Endoscopy;  Laterality: N/A;   ESOPHAGOGASTRODUODENOSCOPY (EGD) WITH PROPOFOL N/A 09/17/2021   Procedure: ESOPHAGOGASTRODUODENOSCOPY (EGD) WITH PROPOFOL;  Surgeon: Lanelle Bal, DO;  [facility];  Service: Endoscopy;  Laterality: N/A;   ESOPHAGOGASTRODUODENOSCOPY (EGD) WITH PROPOFOL N/A 12/19/2021   Procedure: ESOPHAGOGASTRODUODENOSCOPY (EGD) WITH PROPOFOL;  Surgeon: Corbin Ade, MD;  [facility];  Service: Endoscopy;  Laterality: N/A;  with pediatric scope   GIVENS CAPSULE STUDY N/A 11/23/2019   Procedure: GIVENS CAPSULE STUDY;  Surgeon: Corbin Ade, MD;  [facility];  Service: Endoscopy;  Laterality: N/A;   GIVENS CAPSULE STUDY N/A 10/22/2020   Procedure: GIVENS CAPSULE STUDY;  Surgeon: Lanelle Bal, DO;  Location: [facility];  Service: Endoscopy;  Laterality: N/A;   GIVENS CAPSULE STUDY N/A 09/26/2021   Procedure: GIVENS CAPSULE STUDY;  Surgeon: Iva Boop, MD;  Location: [facility] ENDOSCOPY;  Service: Gastroenterology;  Laterality: N/A;   HEMOSTASIS CLIP PLACEMENT  12/11/2020   Procedure: HEMOSTASIS CLIP PLACEMENT;  Surgeon: Lanelle Bal, DO;  Location: [facility];  Service: Endoscopy;;   IR ANGIOGRAM SELECTIVE EACH ADDITIONAL VESSEL  12/05/2020   IR ANGIOGRAM SELECTIVE EACH ADDITIONAL VESSEL  12/05/2020   IR ANGIOGRAM SELECTIVE EACH ADDITIONAL VESSEL  12/05/2020   IR ANGIOGRAM SELECTIVE EACH ADDITIONAL VESSEL  12/13/2020   IR ANGIOGRAM SELECTIVE EACH ADDITIONAL VESSEL  12/13/2020   IR  ANGIOGRAM VISCERAL SELECTIVE  12/05/2020   IR ANGIOGRAM VISCERAL SELECTIVE  12/13/2020   IR EMBO ART  VEN HEMORR LYMPH EXTRAV  INC GUIDE ROADMAPPING  12/05/2020   IR EMBO ART  VEN HEMORR LYMPH EXTRAV  INC GUIDE ROADMAPPING  12/13/2020   IR RADIOLOGIST EVAL & MGMT  01/11/2021   IR US GUIDE VASC ACCESS LEFT  12/13/2020   IR US GUIDE VASC ACCESS RIGHT  12/05/2020   RADIOACTIVE SEED GUIDED EXCISIONAL BREAST BIOPSY Left 08/22/2022   Procedure: RADIOACTIVE SEED GUIDED BRACKETED  EXCISIONAL LEFT BREAST BIOPSY;  Surgeon: [name], MD;  Location: [facility] OR;  Service: General;  Laterality: Left;   SUBMUCOSAL TATTOO INJECTION  09/26/2021   Procedure: SUBMUCOSAL TATTOO INJECTION;  Surgeon: Iva Boop, MD;  Location: [facility] ENDOSCOPY;  Service: Gastroenterology;;    Family History  Problem Relation Age of Onset   Colon cancer Mother 74       61's   Breast cancer Sister    Asthma Brother    Colon polyps Neg Hx    Esophageal cancer Neg Hx    Rectal cancer Neg Hx    Stomach cancer Neg Hx     Allergies as of 09/24/2022 - Review Complete 09/24/2022  Allergen Reaction Noted   Ciprofloxacin  09/10/2022   Penicillin g Hives 07/29/2022   Sulfa antibiotics Rash 04/29/2019    Social History   Socioeconomic History   Marital status: Married    Spouse name: Not on file   Number of children: 1   Years of education: Not on file   Highest education level: Not on file  Occupational History   Occupation: retired  Tobacco Use   Smoking status: Former    Types: Cigarettes    Quit date: 12/23/1984    Years since quitting: 37.7   Smokeless tobacco: Never  Vaping Use   Vaping Use: Never used  Substance and Sexual Activity   Alcohol use: Yes    Comment: wine occasionally, maybe twice a month   Drug use: No   Sexual activity: Not on file  Other Topics Concern   Not on file  Social History Narrative   Patient is married and retired and has 1 grown child   Social Determinants of Manufacturing engineer Strain: Low Risk  (03/28/2022)   Overall Financial Resource Strain (CARDIA)    Difficulty of Paying Living Expenses: Not hard at all  Food Insecurity: No Food Insecurity (03/28/2022)   Hunger Vital Sign    Worried About Running Out of Food in the Last Year: Never true    Ran Out of Food in the Last Year: Never true  Transportation Needs: No Transportation Needs (03/28/2022)   PRAPARE - Administrator, Civil Service (Medical): No    Lack of Transportation (Non-Medical): No  Physical Activity: Insufficiently Active (03/28/2022)   Exercise Vital Sign    Days of Exercise per Week: 3 days    Minutes of Exercise per Session: 30 min  Stress: No Stress Concern Present (03/28/2022)   [employer] of Occupational Health - Occupational Stress Questionnaire    Feeling of Stress : Not at all  Social Connections: Moderately Isolated (03/28/2022)   Social Connection and Isolation Panel [NHANES]    Frequency of Communication with Friends and Family: More than three times a week    Frequency of Social Gatherings with Friends and Family: More than three times a week    Attends Religious Services: Never    Database administrator or Organizations: No    Attends Banker Meetings: Never    Marital Status: Married   Review of Systems   Gen: Denies fever, chills, anorexia. Denies fatigue, weakness, weight loss.  CV: Denies chest pain, palpitations, syncope, peripheral edema, and claudication. Resp: Denies dyspnea at rest, cough, wheezing, coughing up blood, and pleurisy. GI: See HPI Derm: Denies rash, itching, dry skin Psych: Denies depression, anxiety, memory loss, confusion. No homicidal or suicidal ideation.  Heme: Denies bruising, bleeding, and enlarged lymph nodes.  Physical Exam   BP 126/70 (BP Location: Right Arm, Patient Position: Sitting, Cuff Size: Normal)  Pulse 71   Temp 97.8 F (36.6 C) (Temporal)   Ht 5' 5.5" (1.664 m)   Wt 190 lb 12.8 oz  (86.5 kg)   SpO2 98%   BMI 31.27 kg/m   General:   Alert and oriented. No distress noted. Pleasant and cooperative.  Head:  Normocephalic and atraumatic. Eyes:  Conjuctiva clear without scleral icterus. Mouth:  Oral mucosa pink and moist. Good dentition. No lesions. Lungs:  Clear to auscultation bilaterally. No wheezes, rales, or rhonchi. No distress.  Heart:  S1, S2 present without murmurs appreciated.  Abdomen:  +BS, soft, non-tender and non-distended. No rebound or guarding. No HSM or masses noted. Rectal: deferred Msk:  Symmetrical without gross deformities. Normal posture. Extremities:  Without edema. Neurologic:  Alert and  oriented x4 Psych:  Alert and cooperative. Normal mood and affect.   Assessment  DEZYRAE LUEBBERT is a 77 y.o. female with a history of IDA in the setting of GI bleeding secondary to known AVMs with multiple prior endoscopic including EGDs, colonoscopies, capsule studies, and IR interventions, diverticulitis, GERD, HLD, HTN presenting today with complaint of dark stools  GERD: Well-controlled with pantoprazole 40 mg once daily.  Denies any nausea, vomiting, or dysphagia.  Iron deficiency anemia, small bowel AVMs/dieulafoy lesion: Continues to follow with hematology in regards to her iron deficiency anemia.  As outlined in HPI and prior visit notes that she has had significant history of upper GI bleeding secondary to AVMs and Dieulafoy lesion.  Her most recent hemoglobin in June was stable at 12.2.  Has had a slow decline of hemoglobin since her left breast procedure however this is likely due to resolving hematoma.  She has noted some darker stools but has not seen this in a while.  Denies any overt GI bleeding and also without any dizziness, lightheadedness, pica, chest pain, or shortness of breath.  As noted below she is due for surveillance colonoscopy.  Will only consider upper endoscopy/enteroscopy if she continues to have a decline in hemoglobin.  Will  follow-up on her labs that she will have performed in a couple weeks with hematology.  She will continue on oral iron therapy once daily with increased dose of iron once weekly.  History of adenomatous colon polyps: Last colonoscopy November 2021 performed by Dr. Rhea Reyes.  She had 7 polyps with pathology revealing tubular adenomas.  She was recommended to have a 3-year repeat.  Given some of her darker stools and the fact that she is due for surveillance colonoscopy we will go ahead and proceed with scheduling  PLAN   Proceed with colonoscopy with propofol by Dr. Marletta Reyes in near future: the risks, benefits, and alternatives have been discussed with the patient in detail. The patient states understanding and desires to proceed. ASA 3 Okay for lower volume prep such as Clenpiq or Suprep. Hold oral iron for 7 days. Continue pantoprazole 40 mg once daily.  Follow up labs in a couple weeks with hematology.  Follow up in 6 months.     Brooke Bonito, MSN, FNP-BC, AGACNP-BC [facility]

## 2022-09-24 NOTE — Patient Instructions (Signed)
We will get you scheduled for a colonoscopy in the near future with Dr. Marletta Lor.  Continue taking your pantoprazole 40 mg once daily.  When you get the results of your lab work from hematology please call the office and let us know.  If you have any has some blood in your stool or you start seeing black/tarry stool with each bowel movement please let me know and if we have not completed colonoscopy yet we may can add on the upper endoscopy.  Continue taking your iron.  We will plan to follow-up in 6 months, sooner if needed pending colonoscopy and labs.  It was a pleasure to see you today. I want to create trusting relationships with patients. If you receive a survey regarding your visit,  I greatly appreciate you taking time to fill this out on paper or through your MyChart. I value your feedback.  Brooke Bonito, MSN, FNP-BC, AGACNP-BC [facility]

## 2022-10-11 ENCOUNTER — Encounter: Payer: Self-pay | Admitting: *Deleted

## 2022-10-11 ENCOUNTER — Inpatient Hospital Stay: Payer: Medicare Other | Attending: Physician Assistant

## 2022-10-11 ENCOUNTER — Other Ambulatory Visit: Payer: Self-pay | Admitting: *Deleted

## 2022-10-11 ENCOUNTER — Other Ambulatory Visit: Payer: Self-pay | Admitting: Family Medicine

## 2022-10-11 DIAGNOSIS — D500 Iron deficiency anemia secondary to blood loss (chronic): Secondary | ICD-10-CM | POA: Diagnosis not present

## 2022-10-11 DIAGNOSIS — D62 Acute posthemorrhagic anemia: Secondary | ICD-10-CM

## 2022-10-11 DIAGNOSIS — E538 Deficiency of other specified B group vitamins: Secondary | ICD-10-CM | POA: Diagnosis not present

## 2022-10-11 LAB — CBC WITH DIFFERENTIAL/PLATELET
Abs Immature Granulocytes: 0.02 10*3/uL (ref 0.00–0.07)
Basophils Absolute: 0.1 10*3/uL (ref 0.0–0.1)
Basophils Relative: 1 %
Eosinophils Absolute: 0.3 10*3/uL (ref 0.0–0.5)
Eosinophils Relative: 3 %
HCT: 39.5 % (ref 36.0–46.0)
Hemoglobin: 12.8 g/dL (ref 12.0–15.0)
Immature Granulocytes: 0 %
Lymphocytes Relative: 19 %
Lymphs Abs: 1.9 10*3/uL (ref 0.7–4.0)
MCH: 28.7 pg (ref 26.0–34.0)
MCHC: 32.4 g/dL (ref 30.0–36.0)
MCV: 88.6 fL (ref 80.0–100.0)
Monocytes Absolute: 1 10*3/uL (ref 0.1–1.0)
Monocytes Relative: 10 %
Neutro Abs: 6.8 10*3/uL (ref 1.7–7.7)
Neutrophils Relative %: 67 %
Platelets: 262 10*3/uL (ref 150–400)
RBC: 4.46 MIL/uL (ref 3.87–5.11)
RDW: 13 % (ref 11.5–15.5)
WBC: 10.1 10*3/uL (ref 4.0–10.5)
nRBC: 0 % (ref 0.0–0.2)

## 2022-10-11 LAB — IRON AND TIBC
Iron: 46 ug/dL (ref 28–170)
Saturation Ratios: 14 % (ref 10.4–31.8)
TIBC: 320 ug/dL (ref 250–450)
UIBC: 274 ug/dL

## 2022-10-11 LAB — FERRITIN: Ferritin: 148 ng/mL (ref 11–307)

## 2022-10-11 MED ORDER — NA SULFATE-K SULFATE-MG SULF 17.5-3.13-1.6 GM/177ML PO SOLN: ORAL | 0 refills | Status: DC

## 2022-10-14 ENCOUNTER — Encounter: Payer: Self-pay | Admitting: *Deleted

## 2022-10-16 ENCOUNTER — Telehealth: Payer: Self-pay | Admitting: Gastroenterology

## 2022-10-16 NOTE — Telephone Encounter (Signed)
Reviewed patient's most recent lab work and Hgb stable. Will continue with plan for colonoscopy and her regular follow ups with hematology. We will also continue daily PPI and iron therapy.    Brooke Bonito, MSN, APRN, FNP-BC, AGACNP-BC [facility]

## 2022-10-29 ENCOUNTER — Telehealth: Payer: Self-pay | Admitting: Nurse Practitioner

## 2022-10-29 DIAGNOSIS — R300 Dysuria: Secondary | ICD-10-CM

## 2022-10-29 MED ORDER — FLUCONAZOLE 150 MG PO TABS
ORAL_TABLET | ORAL | 0 refills | Status: DC
Start: 2022-10-29 — End: 2022-11-19

## 2022-10-29 NOTE — Telephone Encounter (Signed)
  Prescription Request  10/29/2022  What is the name of the medication or equipment? FLUCONAZOLE  Have you contacted your pharmacy to request a refill? YES  Which pharmacy would you like this sent to? [facility]  Pt says MMM prescribed this to her and she was taking once weekly but is now out and needs refill sent in.

## 2022-10-29 NOTE — Telephone Encounter (Signed)
Med was prescribed on 09/10/22 and script written as take once weekly x 4 weeks.  Does patient need to continue?

## 2022-11-05 ENCOUNTER — Encounter (HOSPITAL_COMMUNITY)
Admission: RE | Admit: 2022-11-05 | Discharge: 2022-11-05 | Disposition: A | Discharge status: Home / Self Care | Payer: Medicare Other | Source: Ambulatory Visit | Attending: Internal Medicine | Admitting: Internal Medicine

## 2022-11-08 ENCOUNTER — Ambulatory Visit (HOSPITAL_BASED_OUTPATIENT_CLINIC_OR_DEPARTMENT_OTHER): Payer: Medicare Other | Admitting: Anesthesiology

## 2022-11-08 ENCOUNTER — Ambulatory Visit (HOSPITAL_COMMUNITY)
Admission: RE | Admit: 2022-11-08 | Discharge: 2022-11-08 | Disposition: A | Discharge status: Home / Self Care | Payer: Medicare Other | Source: Home / Self Care | Attending: Internal Medicine | Admitting: Internal Medicine

## 2022-11-08 ENCOUNTER — Encounter (HOSPITAL_COMMUNITY)
Admission: RE | Disposition: A | Discharge status: Home / Self Care | Payer: Self-pay | Source: Home / Self Care | Attending: Internal Medicine

## 2022-11-08 ENCOUNTER — Ambulatory Visit (HOSPITAL_COMMUNITY): Payer: Medicare Other | Admitting: Anesthesiology

## 2022-11-08 ENCOUNTER — Encounter (HOSPITAL_COMMUNITY): Payer: Self-pay

## 2022-11-08 DIAGNOSIS — Z79899 Other long term (current) drug therapy: Secondary | ICD-10-CM | POA: Insufficient documentation

## 2022-11-08 DIAGNOSIS — D123 Benign neoplasm of transverse colon: Secondary | ICD-10-CM | POA: Insufficient documentation

## 2022-11-08 DIAGNOSIS — Z86010 Personal history of colonic polyps: Secondary | ICD-10-CM | POA: Insufficient documentation

## 2022-11-08 DIAGNOSIS — D125 Benign neoplasm of sigmoid colon: Secondary | ICD-10-CM | POA: Insufficient documentation

## 2022-11-08 DIAGNOSIS — K219 Gastro-esophageal reflux disease without esophagitis: Secondary | ICD-10-CM | POA: Diagnosis not present

## 2022-11-08 DIAGNOSIS — Z1211 Encounter for screening for malignant neoplasm of colon: Secondary | ICD-10-CM | POA: Insufficient documentation

## 2022-11-08 DIAGNOSIS — K635 Polyp of colon: Secondary | ICD-10-CM | POA: Diagnosis not present

## 2022-11-08 DIAGNOSIS — I10 Essential (primary) hypertension: Secondary | ICD-10-CM | POA: Insufficient documentation

## 2022-11-08 DIAGNOSIS — Z09 Encounter for follow-up examination after completed treatment for conditions other than malignant neoplasm: Secondary | ICD-10-CM | POA: Insufficient documentation

## 2022-11-08 DIAGNOSIS — D500 Iron deficiency anemia secondary to blood loss (chronic): Secondary | ICD-10-CM

## 2022-11-08 DIAGNOSIS — K5730 Diverticulosis of large intestine without perforation or abscess without bleeding: Secondary | ICD-10-CM

## 2022-11-08 DIAGNOSIS — K648 Other hemorrhoids: Secondary | ICD-10-CM | POA: Insufficient documentation

## 2022-11-08 DIAGNOSIS — I340 Nonrheumatic mitral (valve) insufficiency: Secondary | ICD-10-CM | POA: Diagnosis not present

## 2022-11-08 DIAGNOSIS — Z87891 Personal history of nicotine dependence: Secondary | ICD-10-CM | POA: Insufficient documentation

## 2022-11-08 HISTORY — PX: COLONOSCOPY WITH PROPOFOL: SHX5780

## 2022-11-08 HISTORY — PX: POLYPECTOMY: SHX5525

## 2022-11-08 SURGERY — COLONOSCOPY WITH PROPOFOL
Anesthesia: General

## 2022-11-08 MED ORDER — LACTATED RINGERS IV SOLN
INTRAVENOUS | Status: DC
  Administered 2022-11-08: 1000 mL via INTRAVENOUS

## 2022-11-08 MED ORDER — LIDOCAINE HCL (CARDIAC) PF 100 MG/5ML IV SOSY
PREFILLED_SYRINGE | INTRAVENOUS | Status: DC | PRN
Start: 2022-11-08 — End: 2022-11-08
  Administered 2022-11-08: 60 mg via INTRAVENOUS

## 2022-11-08 MED ORDER — PROPOFOL 10 MG/ML IV BOLUS
INTRAVENOUS | Status: DC | PRN
  Administered 2022-11-08: 80 mg via INTRAVENOUS
  Administered 2022-11-08: 20 mg via INTRAVENOUS
  Administered 2022-11-08 (×2): 10 mg via INTRAVENOUS
  Administered 2022-11-08: 20 mg via INTRAVENOUS
  Administered 2022-11-08: 40 mg via INTRAVENOUS

## 2022-11-08 MED ORDER — LACTATED RINGERS IV SOLN: INTRAVENOUS | Status: DC | PRN

## 2022-11-08 NOTE — Anesthesia Preprocedure Evaluation (Signed)
Anesthesia Evaluation  Patient identified by MRN, date of birth, ID band Patient awake    Reviewed: Allergy & Precautions, H&P , NPO status , Patient's Chart, lab work & pertinent test results  Airway Mallampati: II  TM Distance: >3 FB Neck ROM: Full    Dental  (+) Dental Advisory Given, Teeth Intact   Pulmonary neg pulmonary ROS, former smoker   Pulmonary exam normal breath sounds clear to auscultation       Cardiovascular hypertension, Pt. on medications + Valvular Problems/Murmurs MR  Rhythm:Regular Rate:Normal + Systolic murmurs 1. Left ventricular ejection fraction, by estimation, is 60 to 65%. The  left ventricle has normal function. The left ventricle has no regional  wall motion abnormalities. There is severe concentric left ventricular  hypertrophy. Left ventricular diastolic   parameters are consistent with Grade I diastolic dysfunction (impaired  relaxation).   2. Right ventricular systolic function is normal. The right ventricular  size is normal.   3. Left atrial size was severely dilated.   4. The mitral valve is normal in structure. Trivial mitral valve  regurgitation. No evidence of mitral stenosis.   5. The aortic valve is normal in structure. Aortic valve regurgitation is  not visualized. No aortic stenosis is present.   6. The inferior vena cava is normal in size with greater than 50%  respiratory variability, suggesting right atrial pressure of 3 mmHg.      Neuro/Psych  Headaches  negative psych ROS   GI/Hepatic Neg liver ROS,GERD  Medicated,,  Endo/Other  negative endocrine ROS    Renal/GU negative Renal ROS  negative genitourinary   Musculoskeletal negative musculoskeletal ROS (+)    Abdominal   Peds negative pediatric ROS (+)  Hematology  (+) Blood dyscrasia, anemia   Anesthesia Other Findings   Reproductive/Obstetrics negative OB ROS                               Anesthesia Physical Anesthesia Plan  ASA: 2  Anesthesia Plan: General   Post-op Pain Management: Minimal or no pain anticipated   Induction: Intravenous  PONV Risk Score and Plan: 1 and Propofol infusion  Airway Management Planned: Nasal Cannula and Natural Airway  Additional Equipment:   Intra-op Plan:   Post-operative Plan:   Informed Consent: I have reviewed the patients History and Physical, chart, labs and discussed the procedure including the risks, benefits and alternatives for the proposed anesthesia with the patient or authorized representative who has indicated his/her understanding and acceptance.     Dental advisory given  Plan Discussed with: CRNA and Surgeon  Anesthesia Plan Comments:          Anesthesia Quick Evaluation

## 2022-11-08 NOTE — H&P (Signed)
Primary Care Physician:  Raliegh Ip, DO Primary Gastroenterologist:  Dr. Marletta Lor  Pre-Procedure History & Physical: HPI:  Stacey Reyes is a 77 y.o. female is here for a colonoscopy to be performed for surveillance purposes, personal history of adenomatous colon polyps in 2021  Past Medical History:  Diagnosis Date   Anemia    Asymmetric septal hypertrophy (HCC)    cMRI 07/2019: asymmetric hypertrophy measuring up to 25mm in basal septum (11mm in posterior wall), c/w hypertrophic CM, LVOT obstruction d/t SAM of the MV anterior leaflet, c/w HOCM ; no identifiable gene   Blood transfusion without reported diagnosis    Cataract    removed bilateral   Chronic cystitis    Diverticulitis    GERD (gastroesophageal reflux disease)    Heart murmur    Hyperlipidemia    Hypertension    Personal history of colonic polyps-adenomas 01/07/2012   2009 - 2 diminutive adenomas (prior polyps also) 01/07/2012 - 2 diminutive adenomas      Past Surgical History:  Procedure Laterality Date   BREAST BIOPSY Right    No Scar seen    BREAST BIOPSY Left 05/10/2022   MM LT BREAST BX W LOC DEV EA AD LESION IMG BX SPEC STEREO GUIDE 05/10/2022 [facility]   BREAST BIOPSY Left 05/10/2022   MM LT BREAST BX W LOC DEV 1ST LESION IMAGE BX SPEC STEREO GUIDE 05/10/2022 [facility]   BREAST BIOPSY  08/20/2022   MM LT RADIOACTIVE SEED EA ADD LESION LOC MAMMO GUIDE 08/20/2022 [facility]   BREAST BIOPSY  08/20/2022   MM LT RADIOACTIVE SEED LOC MAMMO GUIDE 08/20/2022 [facility]   CATARACT EXTRACTION Bilateral    COLONOSCOPY  01/2020   Dr. Rhea Belton; Five 3 to 9 mm polyps in the ascending colon, Two 4 to 5 mm polyps in the transverse colon, diverticulosis, eryethema and petechia in left colon biopsied, external and internal hemorrhoids. Pathology with tubular adenomas. Left colon biopsy was benign. Recommended 3 year surveillance.   ENTEROSCOPY N/A 09/26/2021   Procedure: ENTEROSCOPY;   Surgeon: Iva Boop, MD;  Location: [facility] ENDOSCOPY;  Service: Gastroenterology;  Laterality: N/A;   ENTEROSCOPY N/A 12/19/2021   Procedure: ENTEROSCOPY;  Surgeon: Corbin Ade, MD;  Location: [facility];  Service: Endoscopy;  Laterality: N/A;   ESOPHAGOGASTRODUODENOSCOPY N/A 10/07/2021   Procedure: ESOPHAGOGASTRODUODENOSCOPY (EGD);  Surgeon: Corbin Ade, MD;  Location: [facility];  Service: Endoscopy;  Laterality: N/A;  EGD with enteroscopy  with pediatric colonoscope   ESOPHAGOGASTRODUODENOSCOPY (EGD) WITH PROPOFOL N/A 11/23/2019   Procedure: ESOPHAGOGASTRODUODENOSCOPY (EGD) WITH PROPOFOL;  Surgeon: Corbin Ade, MD;  [facility];  Service: Endoscopy;  Laterality: N/A;   ESOPHAGOGASTRODUODENOSCOPY (EGD) WITH PROPOFOL N/A 10/20/2020   Procedure: ESOPHAGOGASTRODUODENOSCOPY (EGD) WITH PROPOFOL;  Surgeon: Lanelle Bal, DO;  [facility]: [facility];  Service: Endoscopy;  Laterality: N/A;   ESOPHAGOGASTRODUODENOSCOPY (EGD) WITH PROPOFOL N/A 12/05/2020   Procedure: ESOPHAGOGASTRODUODENOSCOPY (EGD) WITH PROPOFOL;  Surgeon: Malissa Hippo, MD;  [facility];  Service: Endoscopy;  Laterality: N/A;  with enteroscopy   ESOPHAGOGASTRODUODENOSCOPY (EGD) WITH PROPOFOL N/A 12/11/2020   Procedure: ESOPHAGOGASTRODUODENOSCOPY (EGD) WITH PROPOFOL;  Surgeon: Lanelle Bal, DO;  [facility];  Service: Endoscopy;  Laterality: N/A;   ESOPHAGOGASTRODUODENOSCOPY (EGD) WITH PROPOFOL N/A 09/17/2021   Procedure: ESOPHAGOGASTRODUODENOSCOPY (EGD) WITH PROPOFOL;  Surgeon: Lanelle Bal, DO;  [facility];  Service: Endoscopy;  Laterality: N/A;   ESOPHAGOGASTRODUODENOSCOPY (EGD) WITH PROPOFOL N/A 12/19/2021  Procedure: ESOPHAGOGASTRODUODENOSCOPY (EGD) WITH PROPOFOL;  Surgeon: Corbin Ade, MD;  [facility];  Service: Endoscopy;  Laterality: N/A;  with pediatric scope   GIVENS CAPSULE STUDY N/A 11/23/2019   Procedure: GIVENS CAPSULE  STUDY;  Surgeon: Corbin Ade, MD;  [facility];  Service: Endoscopy;  Laterality: N/A;   GIVENS CAPSULE STUDY N/A 10/22/2020   Procedure: GIVENS CAPSULE STUDY;  Surgeon: Lanelle Bal, DO;  [facility];  Service: Endoscopy;  Laterality: N/A;   GIVENS CAPSULE STUDY N/A 09/26/2021   Procedure: GIVENS CAPSULE STUDY;  Surgeon: Iva Boop, MD;  Location: [facility] ENDOSCOPY;  Service: Gastroenterology;  Laterality: N/A;   HEMOSTASIS CLIP PLACEMENT  12/11/2020   Procedure: HEMOSTASIS CLIP PLACEMENT;  Surgeon: Lanelle Bal, DO;  [facility];  Service: Endoscopy;;   IR ANGIOGRAM SELECTIVE EACH ADDITIONAL VESSEL  12/05/2020   IR ANGIOGRAM SELECTIVE EACH ADDITIONAL VESSEL  12/05/2020   IR ANGIOGRAM SELECTIVE EACH ADDITIONAL VESSEL  12/05/2020   IR ANGIOGRAM SELECTIVE EACH ADDITIONAL VESSEL  12/13/2020   IR ANGIOGRAM SELECTIVE EACH ADDITIONAL VESSEL  12/13/2020   IR ANGIOGRAM VISCERAL SELECTIVE  12/05/2020   IR ANGIOGRAM VISCERAL SELECTIVE  12/13/2020   IR EMBO ART  VEN HEMORR LYMPH EXTRAV  INC GUIDE ROADMAPPING  12/05/2020   IR EMBO ART  VEN HEMORR LYMPH EXTRAV  INC GUIDE ROADMAPPING  12/13/2020   IR RADIOLOGIST EVAL & MGMT  01/11/2021   IR US GUIDE VASC ACCESS LEFT  12/13/2020   IR US GUIDE VASC ACCESS RIGHT  12/05/2020   RADIOACTIVE SEED GUIDED EXCISIONAL BREAST BIOPSY Left 08/22/2022   Procedure: RADIOACTIVE SEED GUIDED BRACKETED  EXCISIONAL LEFT BREAST BIOPSY;  Surgeon: [name], MD;  Location: [facility];  Service: General;  Laterality: Left;   SUBMUCOSAL TATTOO INJECTION  09/26/2021   Procedure: SUBMUCOSAL TATTOO INJECTION;  Surgeon: Iva Boop, MD;  Location: [facility] ENDOSCOPY;  Service: Gastroenterology;;    Prior to Admission medications   Medication Sig Start Date End Date Taking? Authorizing Provider  acetaminophen (TYLENOL) 325 MG tablet Take 325 mg by mouth every 6 (six) hours as needed for moderate pain.   Yes [provider]   Ascorbic Acid (VITAMIN C PO) Take 1 tablet by mouth daily.   Yes [provider]  bisoprolol (ZEBETA) 5 MG tablet TAKE ONE-HALF TABLET BY MOUTH  DAILY 07/22/22  Yes Rollene Rotunda, MD  CALCIUM PO Take 1 tablet by mouth daily.   Yes [provider]  Cholecalciferol (VITAMIN D-3 PO) Take 1 capsule by mouth daily.   Yes [provider]  COPPER PO Take 1 capsule by mouth daily.   Yes [provider]  cyanocobalamin (CYANOCOBALAMIN) 500 MCG tablet Take 500 mcg by mouth daily.   Yes [provider]  fluconazole (DIFLUCAN) 150 MG tablet 1 po q week x 4 weeks 10/29/22  Yes Martin, Mary-Deandria, FNP  MAGNESIUM PO Take 1 tablet by mouth daily.   Yes [provider]  Na Sulfate-K Sulfate-Mg Sulf 17.5-3.13-1.6 GM/177ML SOLN As directed 10/11/22  Yes Nasim Habeeb K, DO  pantoprazole (PROTONIX) 40 MG tablet TAKE 1 TABLET BY MOUTH TWICE  DAILY 10/14/22  Yes Gottschalk, Ashly M, DO  rosuvastatin (CRESTOR) 10 MG tablet TAKE 1 TABLET BY MOUTH AT  BEDTIME Patient taking differently: Take 5 mg by mouth at bedtime. 03/19/22  Yes Gottschalk, Kathie Rhodes M, DO  Vitamin D, Ergocalciferol, (DRISDOL) 1.25 MG (50000 UNIT) CAPS capsule TAKE 1 CAPSULE BY MOUTH  ONCE WEEKLY 05/02/22  Yes Pennington, Rebekah M, PA-C  VITAMIN E PO Take 1 tablet by mouth daily.   Yes [provider]  doxycycline (VIBRA-TABS) 100 MG tablet Take 1 tablet (100 mg total) by mouth 2 (two) times daily. 1 po bid Patient not taking: Reported on 09/24/2022 09/10/22   Bennie Pierini, FNP  ferrous sulfate ([facility] IRON) 325 (65 FE) MG tablet TAKE 1 TABLET BY MOUTH EVERY DAY 02/26/22   Delynn Flavin M, DO    Allergies as of 10/11/2022 - Review Complete 09/24/2022  Allergen Reaction Noted   Ciprofloxacin  09/10/2022   Penicillin g Hives 07/29/2022   Sulfa antibiotics Rash 04/29/2019    Family History  Problem Relation Age of Onset   Colon cancer Mother 61       51's   Breast cancer Sister     Asthma Brother    Colon polyps Neg Hx    Esophageal cancer Neg Hx    Rectal cancer Neg Hx    Stomach cancer Neg Hx     Social History   Socioeconomic History   Marital status: Married    Spouse name: Not on file   Number of children: 1   Years of education: Not on file   Highest education level: Not on file  Occupational History   Occupation: retired  Tobacco Use   Smoking status: Former    Current packs/day: 0.00    Types: Cigarettes    Quit date: 12/23/1984    Years since quitting: 37.9   Smokeless tobacco: Never  Vaping Use   Vaping status: Never Used  Substance and Sexual Activity   Alcohol use: Yes    Comment: wine occasionally, maybe twice a month   Drug use: No   Sexual activity: Not on file  Other Topics Concern   Not on file  Social History Narrative   Patient is married and retired and has 1 grown child   Social Determinants of Corporate investment banker Strain: Low Risk  (03/28/2022)   Overall Financial Resource Strain (CARDIA)    Difficulty of Paying Living Expenses: Not hard at all  Food Insecurity: No Food Insecurity (03/28/2022)   Hunger Vital Sign    Worried About Running Out of Food in the Last Year: Never true    Ran Out of Food in the Last Year: Never true  Transportation Needs: No Transportation Needs (03/28/2022)   PRAPARE - Administrator, Civil Service (Medical): No    Lack of Transportation (Non-Medical): No  Physical Activity: Insufficiently Active (03/28/2022)   Exercise Vital Sign    Days of Exercise per Week: 3 days    Minutes of Exercise per Session: 30 min  Stress: No Stress Concern Present (03/28/2022)   [employer] of Occupational Health - Occupational Stress Questionnaire    Feeling of Stress : Not at all  Social Connections: Moderately Isolated (03/28/2022)   Social Connection and Isolation Panel [NHANES]    Frequency of Communication with Friends and Family: More than three times a week    Frequency of Social  Gatherings with Friends and Family: More than three times a week    Attends Religious Services: Never    Database administrator or Organizations: No    Attends Banker Meetings: Never    Marital Status: Married  Catering manager Violence: Not At Risk (03/28/2022)   Humiliation, Afraid, Rape, and Kick questionnaire    Fear of Current or Ex-Partner: No  Emotionally Abused: No    Physically Abused: No    Sexually Abused: No    Review of Systems: See HPI, otherwise negative ROS  Physical Exam: Vital signs in last 24 hours: Temp:  [98.1 F (36.7 C)] 98.1 F (36.7 C) (08/16 0711) Resp:  [14] 14 (08/16 0711) BP: (144)/(76) 144/76 (08/16 0711) SpO2:  [94 %] 94 % (08/16 0711) Weight:  [86.2 kg] 86.2 kg (08/16 0711)   General:   Alert,  Well-developed, well-nourished, pleasant and cooperative in NAD Head:  Normocephalic and atraumatic. Eyes:  Sclera clear, no icterus.   Conjunctiva pink. Ears:  Normal auditory acuity. Nose:  No deformity, discharge,  or lesions. Msk:  Symmetrical without gross deformities. Normal posture. Extremities:  Without clubbing or edema. Neurologic:  Alert and  oriented x4;  grossly normal neurologically. Skin:  Intact without significant lesions or rashes. Psych:  Alert and cooperative. Normal mood and affect.  Impression/Plan: Waymon Budge is here for a colonoscopy to be performed for surveillance purposes, personal history of adenomatous colon polyps in 2021  The risks of the procedure including infection, bleed, or perforation as well as benefits, limitations, alternatives and imponderables have been reviewed with the patient. Questions have been answered. All parties agreeable.

## 2022-11-08 NOTE — Op Note (Signed)
[facility] Patient Name: Stacey Reyes Procedure Date: 11/08/2022 7:55 AM MRN: [id number] Date of Birth: May 04, 1945 Attending MD: Hennie Duos. Marletta Lor , [place], [id number] CSN: [id number] Age: 77 Admit Type: Outpatient Procedure:                Colonoscopy Indications:              Surveillance: Personal history of adenomatous                            polyps on last colonoscopy 3 years ago Providers:                Hennie Duos. Marletta Lor, DO, Angelica Ran, Yolanda Bonine Referring MD:              Medicines:                See the Anesthesia note for documentation of the                            administered medications Complications:            No immediate complications. Estimated Blood Loss:     Estimated blood loss was minimal. Procedure:                Pre-Anesthesia Assessment:                           - The anesthesia plan was to use monitored                            anesthesia care (MAC).                           After obtaining informed consent, the colonoscope                            was passed under direct vision. Throughout the                            procedure, the patient's blood pressure, pulse, and                            oxygen saturations were monitored continuously. The                            [id number] ([id number]) was introduced through the                            anus and advanced to the the cecum, identified by                            appendiceal orifice and ileocecal valve. The                            colonoscopy was performed without difficulty.  The                            patient tolerated the procedure well. The quality                            of the bowel preparation was evaluated using the                            BBPS [facility] Bowel Preparation Scale) with scores                            of: Right Colon = 2 (minor amount of residual                            staining, small  fragments of stool and/or opaque                            liquid, but mucosa seen well), Transverse Colon = 3                            (entire mucosa seen well with no residual staining,                            small fragments of stool or opaque liquid) and Left                            Colon = 3 (entire mucosa seen well with no residual                            staining, small fragments of stool or opaque                            liquid). The total BBPS score equals 8. The quality                            of the bowel preparation was good. Scope In: 8:05:28 AM Scope Out: 8:20:19 AM Scope Withdrawal Time: 0 hours 10 minutes 30 seconds  Total Procedure Duration: 0 hours 14 minutes 51 seconds  Findings:      Non-bleeding internal hemorrhoids were found during endoscopy.      Many large-mouthed and small-mouthed diverticula were found in the       sigmoid colon, descending colon, transverse colon and ascending colon.      A 4 mm polyp was found in the transverse colon. The polyp was sessile.       The polyp was removed with a cold snare. Resection and retrieval were       complete.      A 4 mm polyp was found in the sigmoid colon. The polyp was sessile. The       polyp was removed with a cold snare. Resection and retrieval were       complete.      The exam was otherwise without abnormality. Impression:               -  Non-bleeding internal hemorrhoids.                           - Diverticulosis in the sigmoid colon, in the                            descending colon, in the transverse colon and in                            the ascending colon.                           - One 4 mm polyp in the transverse colon, removed                            with a cold snare. Resected and retrieved.                           - One 4 mm polyp in the sigmoid colon, removed with                            a cold snare. Resected and retrieved.                           - The examination  was otherwise normal. Moderate Sedation:      Per Anesthesia Care Recommendation:           - Patient has a contact number available for                            emergencies. The signs and symptoms of potential                            delayed complications were discussed with the                            patient. Return to normal activities tomorrow.                            Written discharge instructions were provided to the                            patient.                           - Resume previous diet.                           - Continue present medications.                           - Await pathology results.                           - No repeat colonoscopy due to age.                           -  Return to GI clinic in 6 months. Procedure Code(s):        --- Professional ---                           [phone], Colonoscopy, flexible; with removal of                            tumor(s), polyp(s), or other lesion(s) by snare                            technique Diagnosis Code(s):        --- Professional ---                           Z86.010, Personal history of colonic polyps                           K64.8, Other hemorrhoids                           D12.3, Benign neoplasm of transverse colon (hepatic                            flexure or splenic flexure)                           D12.5, Benign neoplasm of sigmoid colon                           K57.30, Diverticulosis of large intestine without                            perforation or abscess without bleeding CPT copyright 2022 American Medical Association. All rights reserved. The codes documented in this report are preliminary and upon coder review may  be revised to meet current compliance requirements. Hennie Duos. Marletta Lor, DO Hennie Duos. Marletta Lor, DO 11/08/2022 8:25:10 AM This report has been signed electronically. Number of Addenda: 0

## 2022-11-08 NOTE — Discharge Instructions (Signed)
  Colonoscopy Discharge Instructions  Read the instructions outlined below and refer to this sheet in the next few weeks. These discharge instructions provide you with general information on caring for yourself after you leave the hospital. Your doctor may also give you specific instructions. While your treatment has been planned according to the most current medical practices available, unavoidable complications occasionally occur.   ACTIVITY You may resume your regular activity, but move at a slower pace for the next 24 hours.  Take frequent rest periods for the next 24 hours.  Walking will help get rid of the air and reduce the bloated feeling in your belly (abdomen).  No driving for 24 hours (because of the medicine (anesthesia) used during the test).   Do not sign any important legal documents or operate any machinery for 24 hours (because of the anesthesia used during the test).  NUTRITION Drink plenty of fluids.  You may resume your normal diet as instructed by your doctor.  Begin with a light meal and progress to your normal diet. Heavy or fried foods are harder to digest and may make you feel sick to your stomach (nauseated).  Avoid alcoholic beverages for 24 hours or as instructed.  MEDICATIONS You may resume your normal medications unless your doctor tells you otherwise.  WHAT YOU CAN EXPECT TODAY Some feelings of bloating in the abdomen.  Passage of more gas than usual.  Spotting of blood in your stool or on the toilet paper.  IF YOU HAD POLYPS REMOVED DURING THE COLONOSCOPY: No aspirin products for 7 days or as instructed.  No alcohol for 7 days or as instructed.  Eat a soft diet for the next 24 hours.  FINDING OUT THE RESULTS OF YOUR TEST Not all test results are available during your visit. If your test results are not back during the visit, make an appointment with your caregiver to find out the results. Do not assume everything is normal if you have not heard from your  caregiver or the medical facility. It is important for you to follow up on all of your test results.  SEEK IMMEDIATE MEDICAL ATTENTION IF: You have more than a spotting of blood in your stool.  Your belly is swollen (abdominal distention).  You are nauseated or vomiting.  You have a temperature over 101.  You have abdominal pain or discomfort that is severe or gets worse throughout the day.   Your colonoscopy revealed 2 polyp(s) which I removed successfully. Await pathology results, my office will contact you. Given your age, I do not think we need to perform further colonoscopy for polyp surveillance.   You also have diverticulosis and internal hemorrhoids. I would recommend increasing fiber in your diet or adding OTC Benefiber/Metamucil. Be sure to drink at least 4 to 6 glasses of water daily. Follow-up with GI in 6 months   I hope you have a great rest of your week!  Hennie Duos. Marletta Lor, D.O. Gastroenterology and Hepatology [facility]

## 2022-11-08 NOTE — Anesthesia Postprocedure Evaluation (Signed)
Anesthesia Post Note  Patient: Stacey Reyes  Procedure(s) Performed: COLONOSCOPY WITH PROPOFOL POLYPECTOMY  Patient location during evaluation: Phase II Anesthesia Type: General Level of consciousness: awake and alert and oriented Pain management: pain level controlled Vital Signs Assessment: post-procedure vital signs reviewed and stable Respiratory status: spontaneous breathing, nonlabored ventilation and respiratory function stable Cardiovascular status: blood pressure returned to baseline and stable Postop Assessment: no apparent nausea or vomiting Anesthetic complications: no  No notable events documented.   Last Vitals:  Vitals:   11/08/22 0833 11/08/22 0834  BP: (!) 110/44 (!) 125/57  Resp:    Temp:    SpO2: 97%     Last Pain:  Vitals:   11/08/22 0833  TempSrc:   PainSc: 0-No pain                 Eiden Bagot C Landan Fedie

## 2022-11-08 NOTE — Transfer of Care (Signed)
Immediate Anesthesia Transfer of Care Note  Patient: Stacey Reyes  Procedure(s) Performed: COLONOSCOPY WITH PROPOFOL POLYPECTOMY  Patient Location: PACU  Anesthesia Type:General  Level of Consciousness: drowsy and patient cooperative  Airway & Oxygen Therapy: Patient Spontanous Breathing and Patient connected to nasal cannula oxygen  Post-op Assessment: Report given to RN, Post -op Vital signs reviewed and stable, and Patient moving all extremities X 4  Post vital signs: Reviewed and stable  Last Vitals:  Vitals Value Taken Time  BP    Temp    Pulse    Resp    SpO2     VSS, PACU RN to put vitals in.  Last Pain:  Vitals:   11/08/22 0801  TempSrc:   PainSc: 0-No pain      Patients Stated Pain Goal: 7 (11/08/22 0711)  Complications: No notable events documented.

## 2022-11-12 LAB — SURGICAL PATHOLOGY

## 2022-11-13 ENCOUNTER — Encounter (HOSPITAL_COMMUNITY): Payer: Self-pay | Admitting: Internal Medicine

## 2022-11-19 ENCOUNTER — Ambulatory Visit (INDEPENDENT_AMBULATORY_CARE_PROVIDER_SITE_OTHER): Payer: Medicare Other | Admitting: Nurse Practitioner

## 2022-11-19 ENCOUNTER — Encounter: Payer: Self-pay | Admitting: Nurse Practitioner

## 2022-11-19 VITALS — BP 131/63 | HR 68 | Temp 98.1°F | Resp 20 | Ht 65.0 in | Wt 187.0 lb

## 2022-11-19 DIAGNOSIS — J069 Acute upper respiratory infection, unspecified: Secondary | ICD-10-CM | POA: Diagnosis not present

## 2022-11-19 DIAGNOSIS — D500 Iron deficiency anemia secondary to blood loss (chronic): Secondary | ICD-10-CM

## 2022-11-19 DIAGNOSIS — M79641 Pain in right hand: Secondary | ICD-10-CM

## 2022-11-19 DIAGNOSIS — M79642 Pain in left hand: Secondary | ICD-10-CM | POA: Diagnosis not present

## 2022-11-19 LAB — HEMOGLOBIN, FINGERSTICK: Hemoglobin: 13.2 g/dL (ref 11.1–15.9)

## 2022-11-19 MED ORDER — MELOXICAM 15 MG PO TABS
15.0000 mg | ORAL_TABLET | Freq: Every day | ORAL | 3 refills | Status: DC
Start: 2022-11-19 — End: 2022-12-10

## 2022-11-19 MED ORDER — FLUTICASONE PROPIONATE 50 MCG/ACT NA SUSP: 2.0000 | Freq: Every day | NASAL | 6 refills | Status: DC

## 2022-11-19 NOTE — Patient Instructions (Addendum)

## 2022-11-19 NOTE — Addendum Note (Signed)
Addended by: Cleda Daub on: 11/19/2022 08:27 AM   Modules accepted: Orders

## 2022-11-19 NOTE — Progress Notes (Signed)
Subjective:    Patient ID: Stacey Reyes, female    DOB: 15-Mar-1946, 77 y.o.   MRN: [id number]   Chief Complaint: bil hand pain  Hand Pain  The incident occurred more than 1 week ago. There was no injury mechanism. The pain is present in the right fingers, left fingers, left hand and right hand. The quality of the pain is described as aching. The pain does not radiate. The pain is at a severity of 10/10. The pain is severe. The pain has been Intermittent since the incident. Pertinent negatives include no muscle weakness, numbness or tingling. She has tried acetaminophen for the symptoms. The treatment provided mild relief.  URI  This is a new problem. The current episode started in the past 7 days. The problem has been waxing and waning. There has been no fever. Associated symptoms include congestion, coughing and rhinorrhea. Pertinent negatives include no sore throat or swollen glands. She has tried nothing for the symptoms. The treatment provided no relief.    Patient Active Problem List   Diagnosis Date Noted   GI bleed 12/18/2021   GERD (gastroesophageal reflux disease) 12/18/2021   Gastrointestinal hemorrhage    Hypocalcemia 10/06/2021   Obesity (BMI 30-39.9) 10/06/2021   History of GI bleed 09/25/2021   Hypokalemia 09/16/2021   Elevated troponin 10/20/2020   Symptomatic anemia 10/19/2020   Morning headache 03/08/2020   Hypertrophic cardiomyopathy (HCC) 01/17/2020   Iron deficiency anemia due to chronic blood loss 12/21/2019   Need for immunization against influenza 12/14/2019   Acute blood loss anemia 11/22/2019   Educated about COVID-19 virus infection 07/05/2019   Dyspnea on exertion 07/05/2019   B12 deficiency 03/25/2019   Absolute anemia 03/24/2019   Osteopenia after menopause 04/22/2018   Cardiac murmur due to mitral valve disorder 03/03/2018   Heart murmur 03/03/2018   LVH (left ventricular hypertrophy) 11/20/2015   Arrhythmia 10/11/2015   Hypertension,  essential 02/24/2015   Mixed hyperlipidemia 02/24/2015   History of colonic polyps 01/07/2012       Review of Systems  Constitutional:  Negative for chills, fatigue and fever.  HENT:  Positive for congestion and rhinorrhea. Negative for sore throat.   Respiratory:  Positive for cough.   Musculoskeletal:  Positive for arthralgias (bil hand pain).  Neurological:  Negative for tingling and numbness.       Objective:   Physical Exam Vitals reviewed.  Constitutional:      Appearance: Normal appearance.  HENT:     Right Ear: Tympanic membrane normal.     Left Ear: Tympanic membrane normal.     Nose: Congestion and rhinorrhea present.     Mouth/Throat:     Pharynx: No oropharyngeal exudate or posterior oropharyngeal erythema.  Cardiovascular:     Rate and Rhythm: Normal rate and regular rhythm.     Heart sounds: Normal heart sounds.  Pulmonary:     Effort: Pulmonary effort is normal.     Breath sounds: Normal breath sounds.  Neurological:     General: No focal deficit present.     Mental Status: She is alert and oriented to person, place, and time.  Psychiatric:        Mood and Affect: Mood normal.        Behavior: Behavior normal.    BP 131/63   Pulse 68   Temp 98.1 F (36.7 C) (Temporal)   Resp 20   Ht 5\' 5"  (1.651 m)   Wt 187 lb (84.8 kg)   SpO2  93%   BMI 31.12 kg/m         Assessment & Plan:  Stacey Reyes in today with chief complaint of Pain all over each morning (Concerned about rheumatoid arthritis/), Nasal Congestion, and Cough   1. Pain in both hands Warm epsom salt soaks - meloxicam (MOBIC) 15 MG tablet; Take 1 tablet (15 mg total) by mouth daily.  Dispense: 30 tablet; Refill: 3  2. URI with cough and congestion 1. Take meds as prescribed 2. Use a cool mist humidifier especially during the winter months and when heat has been humid. 3. Use saline nose sprays frequently 4. Saline irrigations of the nose can be very helpful if done  frequently.  * 4X daily for 1 week*  * Use of a nettie pot can be helpful with this. Follow directions with this* 5. Drink plenty of fluids 6. Keep thermostat turn down low 7.For any cough or congestion- mucinex OTC 8. For fever or aces or pains- take tylenol or ibuprofen appropriate for age and weight.  * for fevers greater than 101 orally you may alternate ibuprofen and tylenol every  3 hours.    - fluticasone (FLONASE) 50 MCG/ACT nasal spray; Place 2 sprays into both nostrils daily.  Dispense: 16 g; Refill: 6    The above assessment and management plan was discussed with the patient. The patient verbalized understanding of and has agreed to the management plan. Patient is aware to call the clinic if symptoms persist or worsen. Patient is aware when to return to the clinic for a follow-up visit. Patient educated on when it is appropriate to go to the emergency department.   Mary-Leianne Daphine Deutscher, FNP

## 2022-11-25 ENCOUNTER — Other Ambulatory Visit: Payer: Self-pay | Admitting: Family Medicine

## 2022-11-26 NOTE — Telephone Encounter (Signed)
30 days given . Needs appointment

## 2022-11-26 NOTE — Telephone Encounter (Signed)
Appt scheduled for 12/02/2022 11:30

## 2022-12-02 ENCOUNTER — Encounter: Payer: Self-pay | Admitting: Family Medicine

## 2022-12-02 ENCOUNTER — Ambulatory Visit (INDEPENDENT_AMBULATORY_CARE_PROVIDER_SITE_OTHER): Payer: Medicare Other | Admitting: Family Medicine

## 2022-12-02 VITALS — Ht 65.0 in

## 2022-12-02 DIAGNOSIS — I10 Essential (primary) hypertension: Secondary | ICD-10-CM | POA: Diagnosis not present

## 2022-12-02 DIAGNOSIS — E559 Vitamin D deficiency, unspecified: Secondary | ICD-10-CM

## 2022-12-02 DIAGNOSIS — E782 Mixed hyperlipidemia: Secondary | ICD-10-CM

## 2022-12-02 DIAGNOSIS — R739 Hyperglycemia, unspecified: Secondary | ICD-10-CM

## 2022-12-02 LAB — BAYER DCA HB A1C WAIVED: HB A1C (BAYER DCA - WAIVED): 5.5 % (ref 4.8–5.6)

## 2022-12-02 MED ORDER — ROSUVASTATIN CALCIUM 10 MG PO TABS: 10.0000 mg | ORAL_TABLET | Freq: Every day | ORAL | 3 refills | Status: DC

## 2022-12-02 NOTE — Progress Notes (Signed)
Patient left after having labs drawn.  She was not seen

## 2022-12-03 LAB — BASIC METABOLIC PANEL
BUN/Creatinine Ratio: 19 (ref 12–28)
BUN: 12 mg/dL (ref 8–27)
CO2: 24 mmol/L (ref 20–29)
Calcium: 9 mg/dL (ref 8.7–10.3)
Chloride: 103 mmol/L (ref 96–106)
Creatinine, Ser: 0.64 mg/dL (ref 0.57–1.00)
Glucose: 100 mg/dL — ABNORMAL HIGH (ref 70–99)
Potassium: 4.3 mmol/L (ref 3.5–5.2)
Sodium: 139 mmol/L (ref 134–144)
eGFR: 91 mL/min/{1.73_m2} (ref >59)

## 2022-12-03 LAB — VITAMIN D 25 HYDROXY (VIT D DEFICIENCY, FRACTURES): Vit D, 25-Hydroxy: 40.6 ng/mL (ref 30.0–100.0)

## 2022-12-03 LAB — LIPID PANEL
Chol/HDL Ratio: 4.1 ratio (ref 0.0–4.4)
Cholesterol, Total: 154 mg/dL (ref 100–199)
HDL: 38 mg/dL — ABNORMAL LOW (ref >39)
LDL Chol Calc (NIH): 79 mg/dL (ref 0–99)
Triglycerides: 221 mg/dL — ABNORMAL HIGH (ref 0–149)
VLDL Cholesterol Cal: 37 mg/dL (ref 5–40)

## 2022-12-10 ENCOUNTER — Telehealth: Payer: Self-pay | Admitting: Family Medicine

## 2022-12-10 DIAGNOSIS — M79641 Pain in right hand: Secondary | ICD-10-CM

## 2022-12-10 MED ORDER — MELOXICAM 15 MG PO TABS: 15.0000 mg | ORAL_TABLET | Freq: Every day | ORAL | 0 refills | Status: DC

## 2022-12-10 NOTE — Telephone Encounter (Signed)
Aware refill for 90-d supply was sent to pharmacy, original script for 30-d w/ 3 refills

## 2022-12-10 NOTE — Telephone Encounter (Signed)
  Prescription Request  12/10/2022  Is this a "Controlled Substance" medicine? no  Have you seen your PCP in the last 2 weeks? no   If YES, route message to pool  -  If NO, patient needs to be scheduled for appointment.  What is the name of the medication or equipment? meloxicam (MOBIC) 15 MG tablet   Have you contacted your pharmacy to request a refill? Yes but was told it is too early to fill, pt needs to refill this because she will be leaving out of town   pt would like a 90 day supply since she will be leaving out of town in Oct as well  Which pharmacy would you like this sent to? [facility]    Patient notified that their request is being sent to the clinical staff for review and that they should receive a response within 2 business days.

## 2022-12-23 NOTE — Progress Notes (Unsigned)
Subjective: ZO:XWRU def anemia PCP: Stacey Ip, DO EAV:[id number] [name] is a 77 y.o. female presenting to clinic today for:  1. Iron deficiency anemia Doing well since GI clipping. No hematochezia/ melena.  Notes pitting of nails.  Continues to take iron daily and wonders if she can start tapering  2. Polyarthralgia Was seen by another provider and started on mobic.  She feels like this is a miracle med and has helped tremendously with arthritis/ mobility.  She's seen no blood in stool. Denies bleeding. Was not aware it could increase risk. Notes tylenol has not been helpful with aches and pains.   ROS: Per HPI  Allergies  Allergen Reactions   Ciprofloxacin    Penicillin G Hives   Sulfa Antibiotics Rash   Past Medical History:  Diagnosis Date   Anemia    Asymmetric septal hypertrophy (HCC)    cMRI 07/2019: asymmetric hypertrophy measuring up to 25mm in basal septum (11mm in posterior wall), c/w hypertrophic CM, LVOT obstruction d/t SAM of the MV anterior leaflet, c/w HOCM ; no identifiable gene   Blood transfusion without reported diagnosis    Cataract    removed bilateral   Chronic cystitis    Diverticulitis    GERD (gastroesophageal reflux disease)    Heart murmur    Hyperlipidemia    Hypertension    Personal history of colonic polyps-adenomas 01/07/2012   2009 - 2 diminutive adenomas (prior polyps also) 01/07/2012 - 2 diminutive adenomas      Current Outpatient Medications:    acetaminophen (TYLENOL) 325 MG tablet, Take 325 mg by mouth every 6 (six) hours as needed for moderate pain., Disp: , Rfl:    Ascorbic Acid (VITAMIN C PO), Take 1 tablet by mouth daily., Disp: , Rfl:    bisoprolol (ZEBETA) 5 MG tablet, TAKE ONE-HALF TABLET BY MOUTH  DAILY, Disp: 45 tablet, Rfl: 3   CALCIUM PO, Take 1 tablet by mouth daily., Disp: , Rfl:    Cholecalciferol (VITAMIN D-3 PO), Take 1 capsule by mouth daily., Disp: , Rfl:    COPPER PO, Take 1 capsule by mouth daily., Disp:  , Rfl:    cyanocobalamin (CYANOCOBALAMIN) 500 MCG tablet, Take 500 mcg by mouth daily., Disp: , Rfl:    ferrous sulfate ([facility] IRON) 325 (65 FE) MG tablet, TAKE 1 TABLET BY MOUTH EVERY DAY, Disp: 90 tablet, Rfl: 3   fluticasone (FLONASE) 50 MCG/ACT nasal spray, Place 2 sprays into both nostrils daily., Disp: 16 g, Rfl: 6   MAGNESIUM PO, Take 1 tablet by mouth daily., Disp: , Rfl:    meloxicam (MOBIC) 15 MG tablet, Take 1 tablet (15 mg total) by mouth daily., Disp: 90 tablet, Rfl: 0   Na Sulfate-K Sulfate-Mg Sulf 17.5-3.13-1.6 GM/177ML SOLN, As directed, Disp: 354 mL, Rfl: 0   pantoprazole (PROTONIX) 40 MG tablet, TAKE 1 TABLET BY MOUTH TWICE  DAILY, Disp: 200 tablet, Rfl: 2   rosuvastatin (CRESTOR) 10 MG tablet, Take 1 tablet (10 mg total) by mouth at bedtime., Disp: 100 tablet, Rfl: 3   Vitamin D, Ergocalciferol, (DRISDOL) 1.25 MG (50000 UNIT) CAPS capsule, TAKE 1 CAPSULE BY MOUTH  ONCE WEEKLY, Disp: 15 capsule, Rfl: 2   VITAMIN E PO, Take 1 tablet by mouth daily., Disp: , Rfl:  Social History   Socioeconomic History   Marital status: Married    Spouse name: Not on file   Number of children: 1   Years of education: Not on file   Highest education level:  Not on file  Occupational History   Occupation: retired  Tobacco Use   Smoking status: Former    Current packs/day: 0.00    Types: Cigarettes    Quit date: 12/23/1984    Years since quitting: 38.0   Smokeless tobacco: Never  Vaping Use   Vaping status: Never Used  Substance and Sexual Activity   Alcohol use: Yes    Comment: wine occasionally, maybe twice a month   Drug use: No   Sexual activity: Not on file  Other Topics Concern   Not on file  Social History Narrative   Patient is married and retired and has 1 grown child   Social Determinants of Corporate investment banker Strain: Low Risk  (03/28/2022)   Overall Financial Resource Strain (CARDIA)    Difficulty of Paying Living Expenses: Not hard at all  Food Insecurity: No  Food Insecurity (03/28/2022)   Hunger Vital Sign    Worried About Running Out of Food in the Last Year: Never true    Ran Out of Food in the Last Year: Never true  Transportation Needs: No Transportation Needs (03/28/2022)   PRAPARE - Administrator, Civil Service (Medical): No    Lack of Transportation (Non-Medical): No  Physical Activity: Insufficiently Active (03/28/2022)   Exercise Vital Sign    Days of Exercise per Week: 3 days    Minutes of Exercise per Session: 30 min  Stress: No Stress Concern Present (03/28/2022)   [employer] of Occupational Health - Occupational Stress Questionnaire    Feeling of Stress : Not at all  Social Connections: Moderately Isolated (03/28/2022)   Social Connection and Isolation Panel [NHANES]    Frequency of Communication with Friends and Family: More than three times a week    Frequency of Social Gatherings with Friends and Family: More than three times a week    Attends Religious Services: Never    Database administrator or Organizations: No    Attends Banker Meetings: Never    Marital Status: Married  Catering manager Violence: Not At Risk (03/28/2022)   Humiliation, Afraid, Rape, and Kick questionnaire    Fear of Current or Ex-Partner: No    Emotionally Abused: No    Physically Abused: No    Sexually Abused: No   Family History  Problem Relation Age of Onset   Colon cancer Mother 30       86's   Breast cancer Sister    Asthma Brother    Colon polyps Neg Hx    Esophageal cancer Neg Hx    Rectal cancer Neg Hx    Stomach cancer Neg Hx     Objective: Office vital signs reviewed. BP (!) 143/76   Pulse 65   Temp 98.5 F (36.9 C)   Ht 5\' 5"  (1.651 m)   Wt 186 lb 3.2 oz (84.5 kg)   SpO2 94%   BMI 30.99 kg/m   Physical Examination:  General: Awake, alert, well nourished, No acute distress HEENT: No conjunctival pallor. Cardio: regular rate and rhythm, S1S2 heard, blowing systolic murmurs appreciated Pulm:  clear to auscultation bilaterally, no wheezes, rhonchi or rales; normal work of breathing on room air Nails: pitting present.  Assessment/ Plan: 77 y.o. female   Pain in both hands - Plan: meloxicam (MOBIC) 15 MG tablet  Iron deficiency anemia secondary to blood loss (chronic) - Plan: CBC  Mixed hyperlipidemia  Hypertension, essential  Mobic renewed but I STRESSED risk of  GI bleeding with ANY Nsaid.  Sadly, tylenol has not been helpful so advised to use medication judiciously.  Recommend close monitoring of CBC.  Continue iron.  BP acceptable upon recheck no changes.  Continue stating for cholesterol control.    Stacey Ip, DO [facility] [phone]

## 2022-12-24 ENCOUNTER — Ambulatory Visit: Payer: Medicare Other | Admitting: Family Medicine

## 2022-12-24 ENCOUNTER — Encounter: Payer: Self-pay | Admitting: Family Medicine

## 2022-12-24 VITALS — BP 143/76 | HR 65 | Temp 98.5°F | Ht 65.0 in | Wt 186.2 lb

## 2022-12-24 DIAGNOSIS — D500 Iron deficiency anemia secondary to blood loss (chronic): Secondary | ICD-10-CM

## 2022-12-24 DIAGNOSIS — Z23 Encounter for immunization: Secondary | ICD-10-CM

## 2022-12-24 DIAGNOSIS — E782 Mixed hyperlipidemia: Secondary | ICD-10-CM | POA: Diagnosis not present

## 2022-12-24 DIAGNOSIS — I10 Essential (primary) hypertension: Secondary | ICD-10-CM | POA: Diagnosis not present

## 2022-12-24 DIAGNOSIS — M79641 Pain in right hand: Secondary | ICD-10-CM | POA: Diagnosis not present

## 2022-12-24 DIAGNOSIS — M79642 Pain in left hand: Secondary | ICD-10-CM

## 2022-12-24 MED ORDER — MELOXICAM 15 MG PO TABS
7.5000 mg | ORAL_TABLET | Freq: Every day | ORAL | 1 refills | Status: DC | PRN
Start: 2022-12-24 — End: 2023-08-26

## 2022-12-25 DIAGNOSIS — I10 Essential (primary) hypertension: Secondary | ICD-10-CM | POA: Diagnosis not present

## 2022-12-25 DIAGNOSIS — M79641 Pain in right hand: Secondary | ICD-10-CM | POA: Diagnosis not present

## 2022-12-25 DIAGNOSIS — Z23 Encounter for immunization: Secondary | ICD-10-CM | POA: Diagnosis not present

## 2023-01-03 ENCOUNTER — Other Ambulatory Visit: Payer: Self-pay | Admitting: Physician Assistant

## 2023-01-06 ENCOUNTER — Encounter: Payer: Self-pay | Admitting: Hematology

## 2023-01-21 ENCOUNTER — Inpatient Hospital Stay: Payer: Medicare Other | Attending: Physician Assistant

## 2023-01-21 DIAGNOSIS — D500 Iron deficiency anemia secondary to blood loss (chronic): Secondary | ICD-10-CM | POA: Diagnosis not present

## 2023-01-21 DIAGNOSIS — E538 Deficiency of other specified B group vitamins: Secondary | ICD-10-CM

## 2023-01-21 DIAGNOSIS — K5521 Angiodysplasia of colon with hemorrhage: Secondary | ICD-10-CM | POA: Diagnosis not present

## 2023-01-21 LAB — COMPREHENSIVE METABOLIC PANEL
ALT: 18 U/L (ref 0–44)
AST: 21 U/L (ref 15–41)
Albumin: 4.1 g/dL (ref 3.5–5.0)
Alkaline Phosphatase: 43 U/L (ref 38–126)
Anion gap: 7 (ref 5–15)
BUN: 10 mg/dL (ref 8–23)
CO2: 29 mmol/L (ref 22–32)
Calcium: 9.2 mg/dL (ref 8.9–10.3)
Chloride: 100 mmol/L (ref 98–111)
Creatinine, Ser: 0.67 mg/dL (ref 0.44–1.00)
GFR, Estimated: >60 mL/min (ref >60)
Glucose, Bld: 105 mg/dL — ABNORMAL HIGH (ref 70–99)
Potassium: 3.8 mmol/L (ref 3.5–5.1)
Sodium: 136 mmol/L (ref 135–145)
Total Bilirubin: 0.7 mg/dL (ref 0.3–1.2)
Total Protein: 7 g/dL (ref 6.5–8.1)

## 2023-01-21 LAB — CBC WITH DIFFERENTIAL/PLATELET
Abs Immature Granulocytes: 0.04 10*3/uL (ref 0.00–0.07)
Basophils Absolute: 0.1 10*3/uL (ref 0.0–0.1)
Basophils Relative: 1 %
Eosinophils Absolute: 0.2 10*3/uL (ref 0.0–0.5)
Eosinophils Relative: 3 %
HCT: 39 % (ref 36.0–46.0)
Hemoglobin: 12.9 g/dL (ref 12.0–15.0)
Immature Granulocytes: 1 %
Lymphocytes Relative: 18 %
Lymphs Abs: 1.6 10*3/uL (ref 0.7–4.0)
MCH: 29.5 pg (ref 26.0–34.0)
MCHC: 33.1 g/dL (ref 30.0–36.0)
MCV: 89 fL (ref 80.0–100.0)
Monocytes Absolute: 0.8 10*3/uL (ref 0.1–1.0)
Monocytes Relative: 10 %
Neutro Abs: 6 10*3/uL (ref 1.7–7.7)
Neutrophils Relative %: 67 %
Platelets: 267 10*3/uL (ref 150–400)
RBC: 4.38 MIL/uL (ref 3.87–5.11)
RDW: 14.6 % (ref 11.5–15.5)
WBC: 8.8 10*3/uL (ref 4.0–10.5)
nRBC: 0 % (ref 0.0–0.2)

## 2023-01-21 LAB — VITAMIN B12: Vitamin B-12: 430 pg/mL (ref 180–914)

## 2023-01-21 LAB — IRON AND TIBC
Iron: 95 ug/dL (ref 28–170)
Saturation Ratios: 27 % (ref 10.4–31.8)
TIBC: 356 ug/dL (ref 250–450)
UIBC: 261 ug/dL

## 2023-01-21 LAB — FERRITIN: Ferritin: 145 ng/mL (ref 11–307)

## 2023-01-24 DIAGNOSIS — Z23 Encounter for immunization: Secondary | ICD-10-CM | POA: Diagnosis not present

## 2023-01-27 LAB — METHYLMALONIC ACID, SERUM: Methylmalonic Acid, Quantitative: 184 nmol/L (ref 0–378)

## 2023-01-27 NOTE — Progress Notes (Unsigned)
VIRTUAL VISIT via TELEPHONE NOTE [facility]   I connected with Stacey Reyes  on 01/28/2023 at 1:35 PM by telephone and verified that I am speaking with the correct person using two identifiers.  Location: Patient: Home Provider: [facility]   I discussed the limitations, risks, security and privacy concerns of performing an evaluation and management service by telephone and the availability of in person appointments. I also discussed with the patient that there may be a patient responsible charge related to this service. The patient expressed understanding and agreed to proceed.  REASON FOR VISIT:  Follow-up for iron deficiency   CURRENT THERAPY: Intermittent IV iron infusions and blood transfusions  INTERVAL HISTORY:  Stacey Reyes is contacted today for follow-up of her iron deficiency anemia.  She was last seen by Rojelio Brenner PA-C on 09/11/2022.  At today's visit, she reports that she is feeling fairly well.  She reports good energy.  She has not noticed any recurrent melena or hematochezia.  No pica, lightheadedness, or syncope.   No chest pain or difficulty breathing.   She is taking daily iron and B12 supplements.  She has 90% energy and 100% appetite.   She endorses that she is maintaining a stable weight.  REVIEW OF SYSTEMS:   Review of Systems  Constitutional:  Negative for chills, diaphoresis, fever, malaise/fatigue and weight loss.  Respiratory:  Negative for cough and shortness of breath.   Cardiovascular:  Negative for chest pain and palpitations.  Gastrointestinal:  Negative for abdominal pain, blood in stool, melena, nausea and vomiting.  Musculoskeletal:  Positive for joint pain.  Neurological:  Positive for tingling (right hand). Negative for dizziness and headaches.  Psychiatric/Behavioral:  The patient has insomnia.      PHYSICAL EXAM: (per limitations of virtual telephone visit)  The patient is alert and oriented x  3, exhibiting adequate mentation, good mood, and ability to speak in full sentences and execute sound judgement.  ASSESSMENT & PLAN:  1.  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  No improvement despite taking iron tablet twice daily - Patient has had multiple hospitalizations and PRBC transfusions for acute blood loss anemia, followed closely by gastroenterology - S/p coil embolization of inferior pancreaticoduodenal artery from the SMA on 12/13/2020. - MOST RECENT HOSPITAL STAY (12/18/2021 - 12/20/2021): Acute blood loss anemia with extreme fatigue and dyspnea on exertion.  She was found to have Hgb 7.8, which had dropped from 11.2 in the course of 1 month.  FOBT was positive.  She received 2 units PRBC during hospitalization.  EGD (12/19/2021) showed Dieulafoy lesion in her stomach.  Hgb at the time of discharge was 9.0. - Colonoscopy (11/08/2022): Nonbleeding internal hemorrhoids, diverticulosis, polyps x 2 (tubular adenoma).  Per GI, no repeat colonoscopy due to age. - Most recent IV Feraheme on 10/22/2021 and 10/29/2021 - She has not had any evidence of recurrent GI bleeding since her most recent hospitalization in September 2023, but did have left breast hematoma and bleeding following biopsy on 08/22/2022 - No rectal bleeding or melena.   - She is taking iron tablet daily.   - Most recent labs (01/21/2023): Hgb 12.9/MCV 89.0, ferritin 145, iron saturation 27%. - PLAN: No indication for IV iron at this time.  Continue daily iron supplement. - Labs and RTC in 6 months  with office visit  - Patient is aware of alarm symptoms that would prompt immediate medical attention.   2.  B12 deficiency - She is taking B12 tablet 1000 mcg daily - Most recent labs (01/21/2023): Vitamin B12 normal at 430/normal MMA - PLAN: Continue B12 supplement.  We will recheck levels  annually (next in November 2025)   PLAN SUMMARY: >> Labs in 6 months = CBC/D, CMP, ferritin, iron/TIBC >> OFFICE visit in 6 months  ** Last office visit was 09/11/2022     I discussed the assessment and treatment plan with the patient. The patient was provided an opportunity to ask questions and all were answered. The patient agreed with the plan and demonstrated an understanding of the instructions.   The patient was advised to call back or seek an in-person evaluation if the symptoms worsen or if the condition fails to improve as anticipated.  I provided 15 minutes of non-face-to-face time during this encounter.  Carnella Guadalajara, PA-C 01/28/23 7:45 PM

## 2023-01-28 ENCOUNTER — Inpatient Hospital Stay: Payer: Medicare Other | Attending: Physician Assistant | Admitting: Physician Assistant

## 2023-01-28 DIAGNOSIS — E538 Deficiency of other specified B group vitamins: Secondary | ICD-10-CM | POA: Diagnosis not present

## 2023-01-28 DIAGNOSIS — E559 Vitamin D deficiency, unspecified: Secondary | ICD-10-CM

## 2023-01-28 DIAGNOSIS — D500 Iron deficiency anemia secondary to blood loss (chronic): Secondary | ICD-10-CM | POA: Diagnosis not present

## 2023-02-12 ENCOUNTER — Other Ambulatory Visit: Payer: Self-pay | Admitting: Family Medicine

## 2023-02-22 ENCOUNTER — Other Ambulatory Visit: Payer: Self-pay | Admitting: Family Medicine

## 2023-02-22 ENCOUNTER — Other Ambulatory Visit: Payer: Self-pay | Admitting: Nurse Practitioner

## 2023-02-22 DIAGNOSIS — R300 Dysuria: Secondary | ICD-10-CM

## 2023-03-21 ENCOUNTER — Telehealth (INDEPENDENT_AMBULATORY_CARE_PROVIDER_SITE_OTHER): Payer: Medicare Other | Admitting: Family

## 2023-03-21 ENCOUNTER — Ambulatory Visit: Payer: Self-pay | Admitting: Family Medicine

## 2023-03-21 ENCOUNTER — Encounter: Payer: Self-pay | Admitting: Family

## 2023-03-21 DIAGNOSIS — B9689 Other specified bacterial agents as the cause of diseases classified elsewhere: Secondary | ICD-10-CM | POA: Diagnosis not present

## 2023-03-21 DIAGNOSIS — J208 Acute bronchitis due to other specified organisms: Secondary | ICD-10-CM

## 2023-03-21 MED ORDER — DOXYCYCLINE HYCLATE 100 MG PO TABS: 100.0000 mg | ORAL_TABLET | Freq: Two times a day (BID) | ORAL | 0 refills | Status: DC

## 2023-03-21 MED ORDER — PROMETHAZINE-DM 6.25-15 MG/5ML PO SYRP: 5.0000 mL | ORAL_SOLUTION | Freq: Three times a day (TID) | ORAL | 0 refills | Status: DC | PRN

## 2023-03-21 MED ORDER — BENZONATATE 200 MG PO CAPS: 200.0000 mg | ORAL_CAPSULE | Freq: Three times a day (TID) | ORAL | 1 refills | Status: DC | PRN

## 2023-03-21 MED ORDER — PREDNISONE 20 MG PO TABS
40.0000 mg | ORAL_TABLET | Freq: Every day | ORAL | 0 refills | Status: AC
Start: 2023-03-21 — End: 2023-03-26

## 2023-03-21 NOTE — Patient Instructions (Signed)

## 2023-03-21 NOTE — Telephone Encounter (Signed)
Copied from CRM [id number]. Topic: Clinical - Red Word Triage >> Mar 21, 2023  8:02 AM Ivette P wrote: Red Word that prompted transfer to Nurse Triage: Bleeding, Patient is blowing nose and thick green and sometimes blood. Very dry cough.   Chief Complaint: productive cough Symptoms: runny nose Frequency: ongoing since 03/11/2023 Pertinent Negatives: Patient denies fever Disposition: [] ED /[x] Urgent Care (no appt availability in office) / [] Appointment(In office/virtual)/ []  [facility]/ [] Home Care/ [] Refused Recommended Disposition /[]  Mobile Bus/ []  Follow-up with PCP Additional Notes: The patient reported that her symptoms started 12/17/202.  She had a sore throat and productive cough.  She was unable to indicate the color of the sputum because she swallows it.  Her throat is no longer sore but her cough has become progressively worse.  She has taken over the counter cold medicine but it has been unhelpful.  She denied a fever.  She coughs to the point that she feels like she could throw up but she has not thrown up.  Her cough is worse at night and she is unable to sleep.  She has coughing fits that cause her to feel short of breath.  Her shortness of breath resolves when she stops coughing.  Her nose is runny and the nasal discharge is green and sometimes white.  Referred the caller to urgent care as there are no in office appointments available.  She inquired what urgent care offices are closest to her and while attempting to locate an office she became agitated and demanded to speak to someone in the office.  Warm transferred to [facility] for further assistance. Reason for Disposition  [1] Continuous (nonstop) coughing interferes with work or school AND [2] no improvement using cough treatment per Care Advice  Answer Assessment - Initial Assessment Questions 1. ONSET: "When did the cough begin?"      03/11/2023 and has gotten worse  2. SEVERITY: "How bad is the cough  today?"      Constant to the point of feeling like she could throw up 3. SPUTUM: "Describe the color of your sputum" (none, dry cough; clear, white, yellow, green)     Swallows sputum  4. HEMOPTYSIS: "Are you coughing up any blood?" If so ask: "How much?" (flecks, streaks, tablespoons, etc.)     none 5. DIFFICULTY BREATHING: "Are you having difficulty breathing?" If Yes, ask: "How bad is it?" (e.g., mild, moderate, severe)    - MILD: No SOB at rest, mild SOB with walking, speaks normally in sentences, can lie down, no retractions, pulse < 100.    - MODERATE: SOB at rest, SOB with minimal exertion and prefers to sit, cannot lie down flat, speaks in phrases, mild retractions, audible wheezing, pulse 100-120.    - SEVERE: Very SOB at rest, speaks in single words, struggling to breathe, sitting hunched forward, retractions, pulse > 120      A little while after coughing, takes a moment to catch breath  6. FEVER: "Do you have a fever?" If Yes, ask: "What is your temperature, how was it measured, and when did it start?"     No  7. CARDIAC HISTORY: "Do you have any history of heart disease?" (e.g., heart attack, congestive heart failure)      No; blood pressure 8. LUNG HISTORY: "Do you have any history of lung disease?"  (e.g., pulmonary embolus, asthma, emphysema)     No 10. OTHER SYMPTOMS: "Do you have any other symptoms?" (e.g., runny nose, wheezing, chest  pain)       Runny nose - green mucus  Answer Assessment - Initial Assessment Questions 1. ONSET: "When did the cough begin?"      03/11/2023 and has worsened 2. SEVERITY: "How bad is the cough today?"      Constant to the point that she feels like she is going to throw up Worse at night 3. SPUTUM: "Describe the color of your sputum" (none, dry cough; clear, white, yellow, green)     Unable to determine as she swallows it 4. HEMOPTYSIS: "Are you coughing up any blood?" If so ask: "How much?" (flecks, streaks, tablespoons, etc.)     none 5.  DIFFICULTY BREATHING: "Are you having difficulty breathing?" If Yes, ask: "How bad is it?" (e.g., mild, moderate, severe)    - MILD: No SOB at rest, mild SOB with walking, speaks normally in sentences, can lie down, no retractions, pulse < 100.    - MODERATE: SOB at rest, SOB with minimal exertion and prefers to sit, cannot lie down flat, speaks in phrases, mild retractions, audible wheezing, pulse 100-120.    - SEVERE: Very SOB at rest, speaks in single words, struggling to breathe, sitting hunched forward, retractions, pulse > 120      Short of breath when coughing and resolves after coughing  6. FEVER: "Do you have a fever?" If Yes, ask: "What is your temperature, how was it measured, and when did it start?"     No 7. CARDIAC HISTORY: "Do you have any history of heart disease?" (e.g., heart attack, congestive heart failure)      hypertension 8. LUNG HISTORY: "Do you have any history of lung disease?"  (e.g., pulmonary embolus, asthma, emphysema)     none 10. OTHER SYMPTOMS: "Do you have any other symptoms?" (e.g., runny nose, wheezing, chest pain)       Runny nose - green mucus  Initially had a sore throat  Protocols used: Cough - Acute Non-Productive-A-AH, Cough - Acute Productive-A-AH

## 2023-03-21 NOTE — Telephone Encounter (Signed)
Video visit scheduled for today by front office staff

## 2023-03-21 NOTE — Progress Notes (Signed)
Virtual Visit Consent   [name], you are scheduled for a virtual visit with a [facility] provider today. Just as with appointments in the office, your consent must be obtained to participate. Your consent will be active for this visit and any virtual visit you may have with one of our providers in the next 365 days. If you have a MyChart account, a copy of this consent can be sent to you electronically.  As this is a virtual visit, video technology does not allow for your provider to perform a traditional examination. This may limit your provider's ability to fully assess your condition. If your provider identifies any concerns that need to be evaluated in person or the need to arrange testing (such as labs, EKG, etc.), we will make arrangements to do so. Although advances in technology are sophisticated, we cannot ensure that it will always work on either your end or our end. If the connection with a video visit is poor, the visit may have to be switched to a telephone visit. With either a video or telephone visit, we are not always able to ensure that we have a secure connection.  By engaging in this virtual visit, you consent to the provision of healthcare and authorize for your insurance to be billed (if applicable) for the services provided during this visit. Depending on your insurance coverage, you may receive a charge related to this service.  I need to obtain your verbal consent now. Are you willing to proceed with your visit today? Stacey Reyes has provided verbal consent on 03/21/2023 for a virtual visit (video or telephone). Stacey Rodney, FNP  Date: 03/21/2023 9:22 AM  Virtual Visit via Video Note   I, Stacey Reyes, connected with  Stacey Reyes  ([id number], Oct 08, 1945) on 03/21/23 at  9:10 AM EST by a video-enabled telemedicine application and verified that I am speaking with the correct person using two identifiers.  Location: Patient: Virtual Visit Location  Patient: Home Provider: Virtual Visit Location Provider: Home Office   I discussed the limitations of evaluation and management by telemedicine and the availability of in person appointments. The patient expressed understanding and agreed to proceed.    History of Present Illness: Stacey Reyes is a 77 y.o. who identifies as a female who was assigned female at birth, and is being seen today for cough.  HPI: Cough This is a new problem. The current episode started 1 to 4 weeks ago. The problem has been gradually worsening. The problem occurs every few minutes. The cough is Productive of purulent sputum. Associated symptoms include nasal congestion, postnasal drip, a sore throat (improved) and shortness of breath (after coughing). Pertinent negatives include no chills, ear congestion, ear pain, fever, headaches, myalgias or wheezing. She has tried rest and OTC cough suppressant for the symptoms. The treatment provided mild relief. There is no history of bronchiectasis or COPD.    Problems:  Patient Active Problem List   Diagnosis Date Noted   GERD (gastroesophageal reflux disease) 12/18/2021   Gastrointestinal hemorrhage    Obesity (BMI 30-39.9) 10/06/2021   History of GI bleed 09/25/2021   Hypertrophic cardiomyopathy (HCC) 01/17/2020   Iron deficiency anemia due to chronic blood loss 12/21/2019   Dyspnea on exertion 07/05/2019   B12 deficiency 03/25/2019   Absolute anemia 03/24/2019   Osteopenia after menopause 04/22/2018   Cardiac murmur due to mitral valve disorder 03/03/2018   Heart murmur 03/03/2018   LVH (left ventricular hypertrophy) 11/20/2015  Arrhythmia 10/11/2015   Hypertension, essential 02/24/2015   Mixed hyperlipidemia 02/24/2015   History of colonic polyps 01/07/2012    Allergies:  Allergies  Allergen Reactions   Ciprofloxacin    Penicillin G Hives   Sulfa Antibiotics Rash   Medications:  Current Outpatient Medications:    benzonatate (TESSALON) 200 MG  capsule, Take 1 capsule (200 mg total) by mouth 3 (three) times daily as needed., Disp: 30 capsule, Rfl: 1   doxycycline (VIBRA-TABS) 100 MG tablet, Take 1 tablet (100 mg total) by mouth 2 (two) times daily., Disp: 20 tablet, Rfl: 0   predniSONE (DELTASONE) 20 MG tablet, Take 2 tablets (40 mg total) by mouth daily with breakfast for 5 days., Disp: 10 tablet, Rfl: 0   promethazine-dextromethorphan (PROMETHAZINE-DM) 6.25-15 MG/5ML syrup, Take 5 mLs by mouth 3 (three) times daily as needed for cough., Disp: 118 mL, Rfl: 0   acetaminophen (TYLENOL) 325 MG tablet, Take 325 mg by mouth every 6 (six) hours as needed for moderate pain., Disp: , Rfl:    Ascorbic Acid (VITAMIN C PO), Take 1 tablet by mouth daily., Disp: , Rfl:    bisoprolol (ZEBETA) 5 MG tablet, TAKE ONE-HALF TABLET BY MOUTH  DAILY, Disp: 45 tablet, Rfl: 3   CALCIUM PO, Take 1 tablet by mouth daily., Disp: , Rfl:    Cholecalciferol (VITAMIN D-3 PO), Take 1 capsule by mouth daily., Disp: , Rfl:    COPPER PO, Take 1 capsule by mouth daily., Disp: , Rfl:    cyanocobalamin (CYANOCOBALAMIN) 500 MCG tablet, Take 500 mcg by mouth daily., Disp: , Rfl:    ferrous sulfate 325 (65 FE) MG tablet, TAKE 1 TABLET BY MOUTH EVERY DAY, Disp: 90 tablet, Rfl: 0   fluticasone (FLONASE) 50 MCG/ACT nasal spray, Place 2 sprays into both nostrils daily., Disp: 16 g, Rfl: 6   MAGNESIUM PO, Take 1 tablet by mouth daily., Disp: , Rfl:    meloxicam (MOBIC) 15 MG tablet, Take 0.5-1 tablets (7.5-15 mg total) by mouth daily as needed for pain (use sparingly. can increase GI bleeding.)., Disp: 100 tablet, Rfl: 1   Na Sulfate-K Sulfate-Mg Sulf 17.5-3.13-1.6 GM/177ML SOLN, As directed, Disp: 354 mL, Rfl: 0   pantoprazole (PROTONIX) 40 MG tablet, TAKE 1 TABLET BY MOUTH TWICE  DAILY, Disp: 200 tablet, Rfl: 2   rosuvastatin (CRESTOR) 10 MG tablet, Take 1 tablet (10 mg total) by mouth at bedtime., Disp: 100 tablet, Rfl: 3   Vitamin D, Ergocalciferol, (DRISDOL) 1.25 MG (50000  UNIT) CAPS capsule, TAKE 1 CAPSULE BY MOUTH ONCE  WEEKLY, Disp: 15 capsule, Rfl: 2   VITAMIN E PO, Take 1 tablet by mouth daily., Disp: , Rfl:   Observations/Objective: Patient is well-developed, well-nourished in no acute distress.  Resting comfortably  at home.  Head is normocephalic, atraumatic.  No labored breathing.  Speech is clear and coherent with logical content.  Patient is alert and oriented at baseline.  Constant dry cough  Assessment and Plan: 1. Acute bacterial bronchitis (Primary) - doxycycline (VIBRA-TABS) 100 MG tablet; Take 1 tablet (100 mg total) by mouth 2 (two) times daily.  Dispense: 20 tablet; Refill: 0 - benzonatate (TESSALON) 200 MG capsule; Take 1 capsule (200 mg total) by mouth 3 (three) times daily as needed.  Dispense: 30 capsule; Refill: 1 - promethazine-dextromethorphan (PROMETHAZINE-DM) 6.25-15 MG/5ML syrup; Take 5 mLs by mouth 3 (three) times daily as needed for cough.  Dispense: 118 mL; Refill: 0 - predniSONE (DELTASONE) 20 MG tablet; Take 2 tablets (40 mg  total) by mouth daily with breakfast for 5 days.  Dispense: 10 tablet; Refill: 0  - Take meds as prescribed - Use a cool mist humidifier  -Use saline nose sprays frequently -Force fluids -For any cough or congestion  Use plain Mucinex- regular strength or max strength is fine -For fever or aces or pains- take tylenol or ibuprofen. -Throat lozenges if help -Follow up if symptoms worsen or do not improve   Follow Up Instructions: I discussed the assessment and treatment plan with the patient. The patient was provided an opportunity to ask questions and all were answered. The patient agreed with the plan and demonstrated an understanding of the instructions.  A copy of instructions were sent to the patient via MyChart unless otherwise noted below.     The patient was advised to call back or seek an in-person evaluation if the symptoms worsen or if the condition fails to improve as anticipated.     Stacey Rodney, FNP

## 2023-04-02 ENCOUNTER — Ambulatory Visit (INDEPENDENT_AMBULATORY_CARE_PROVIDER_SITE_OTHER): Payer: Medicare Other

## 2023-04-02 VITALS — Ht 65.0 in | Wt 186.0 lb

## 2023-04-02 DIAGNOSIS — Z0000 Encounter for general adult medical examination without abnormal findings: Secondary | ICD-10-CM

## 2023-04-02 NOTE — Progress Notes (Signed)
 Subjective:   Stacey Reyes is a 78 y.o. female who presents for Medicare Annual (Subsequent) preventive examination.  Visit Complete: Virtual I connected with  Stacey Reyes on 04/02/23 by a audio enabled telemedicine application and verified that I am speaking with the correct person using two identifiers.  Patient Location: Home  Provider Location: Home Office  I discussed the limitations of evaluation and management by telemedicine. The patient expressed understanding and agreed to proceed.  Vital Signs: Because this visit was a virtual/telehealth visit, some criteria may be missing or patient reported. Any vitals not documented were not able to be obtained and vitals that have been documented are patient reported.  Cardiac Risk Factors include: advanced age (>87men, >65 women);hypertension;dyslipidemia     Objective:    Today's Vitals   04/02/23 1118  Weight: 186 lb (84.4 kg)  Height: 5' 5 (1.651 m)   Body mass index is 30.95 kg/m.     04/02/2023   11:39 AM 01/28/2023    1:55 PM 09/11/2022   12:51 PM 08/22/2022   10:19 AM 08/15/2022   12:04 PM 06/07/2022   12:11 PM 03/29/2022    1:30 PM  Advanced Directives  Does Patient Have a Medical Advance Directive? No No No No No No No  Would patient like information on creating a medical advance directive? Yes (MAU/Ambulatory/Procedural Areas - Information given) No - Patient declined No - Patient declined No - Patient declined Yes (MAU/Ambulatory/Procedural Areas - Information given) No - Patient declined No - Patient declined    Current Medications (verified) Outpatient Encounter Medications as of 04/02/2023  Medication Sig   acetaminophen  (TYLENOL ) 325 MG tablet Take 325 mg by mouth every 6 (six) hours as needed for moderate pain.   Ascorbic Acid (VITAMIN C PO) Take 1 tablet by mouth daily.   benzonatate  (TESSALON ) 200 MG capsule Take 1 capsule (200 mg total) by mouth 3 (three) times daily as needed.   bisoprolol   (ZEBETA ) 5 MG tablet TAKE ONE-HALF TABLET BY MOUTH  DAILY   CALCIUM  PO Take 1 tablet by mouth daily.   Cholecalciferol (VITAMIN D -3 PO) Take 1 capsule by mouth daily.   COPPER  PO Take 1 capsule by mouth daily.   cyanocobalamin  (CYANOCOBALAMIN ) 500 MCG tablet Take 500 mcg by mouth daily.   doxycycline  (VIBRA -TABS) 100 MG tablet Take 1 tablet (100 mg total) by mouth 2 (two) times daily.   ferrous sulfate  325 (65 FE) MG tablet TAKE 1 TABLET BY MOUTH EVERY DAY   fluticasone  (FLONASE ) 50 MCG/ACT nasal spray Place 2 sprays into both nostrils daily.   MAGNESIUM  PO Take 1 tablet by mouth daily.   meloxicam  (MOBIC ) 15 MG tablet Take 0.5-1 tablets (7.5-15 mg total) by mouth daily as needed for pain (use sparingly. can increase GI bleeding.).   Na Sulfate-K Sulfate-Mg Sulf 17.5-3.13-1.6 GM/177ML SOLN As directed   pantoprazole  (PROTONIX ) 40 MG tablet TAKE 1 TABLET BY MOUTH TWICE  DAILY   promethazine -dextromethorphan (PROMETHAZINE -DM) 6.25-15 MG/5ML syrup Take 5 mLs by mouth 3 (three) times daily as needed for cough.   rosuvastatin  (CRESTOR ) 10 MG tablet Take 1 tablet (10 mg total) by mouth at bedtime.   Vitamin D , Ergocalciferol , (DRISDOL ) 1.25 MG (50000 UNIT) CAPS capsule TAKE 1 CAPSULE BY MOUTH ONCE  WEEKLY   VITAMIN E  PO Take 1 tablet by mouth daily.   No facility-administered encounter medications on file as of 04/02/2023.    Allergies (verified) Ciprofloxacin , Penicillin g, and Sulfa antibiotics   History: Past Medical  History:  Diagnosis Date   Anemia    Asymmetric septal hypertrophy    cMRI 07/2019: asymmetric hypertrophy measuring up to 25mm in basal septum (11mm in posterior wall), c/w hypertrophic CM, LVOT obstruction d/t SAM of the MV anterior leaflet, c/w HOCM ; no identifiable gene   Blood transfusion without reported diagnosis    Cataract    removed bilateral   Chronic cystitis    Diverticulitis    GERD (gastroesophageal reflux disease)    Heart murmur    Hyperlipidemia     Hypertension    Personal history of colonic polyps-adenomas 01/07/2012   2009 - 2 diminutive adenomas (prior polyps also) 01/07/2012 - 2 diminutive adenomas     Past Surgical History:  Procedure Laterality Date   BREAST BIOPSY Right    No Scar seen    BREAST BIOPSY Left 05/10/2022   MM LT BREAST BX W LOC DEV EA AD LESION IMG BX SPEC STEREO GUIDE 05/10/2022 [facility]   BREAST BIOPSY Left 05/10/2022   MM LT BREAST BX W LOC DEV 1ST LESION IMAGE BX SPEC STEREO GUIDE 05/10/2022 [facility]   BREAST BIOPSY  08/20/2022   MM LT RADIOACTIVE SEED EA ADD LESION LOC MAMMO GUIDE 08/20/2022 [facility]   BREAST BIOPSY  08/20/2022   MM LT RADIOACTIVE SEED LOC MAMMO GUIDE 08/20/2022 [facility]   CATARACT EXTRACTION Bilateral    COLONOSCOPY  01/2020   Dr. Albertus; Five 3 to 9 mm polyps in the ascending colon, Two 4 to 5 mm polyps in the transverse colon, diverticulosis, eryethema and petechia in left colon biopsied, external and internal hemorrhoids. Pathology with tubular adenomas. Left colon biopsy was benign. Recommended 3 year surveillance.   COLONOSCOPY WITH PROPOFOL  N/A 11/08/2022   Procedure: COLONOSCOPY WITH PROPOFOL ;  Surgeon: Cindie Carlin POUR, DO;  [facility];  Service: Endoscopy;  Laterality: N/A;  8:30 am, asa 3   ENTEROSCOPY N/A 09/26/2021   Procedure: ENTEROSCOPY;  Surgeon: Avram Lupita BRAVO, MD;  Location: [facility] ENDOSCOPY;  Service: Gastroenterology;  Laterality: N/A;   ENTEROSCOPY N/A 12/19/2021   Procedure: ENTEROSCOPY;  Surgeon: Shaaron Lamar HERO, MD;  [facility]: [facility];  Service: Endoscopy;  Laterality: N/A;   ESOPHAGOGASTRODUODENOSCOPY N/A 10/07/2021   Procedure: ESOPHAGOGASTRODUODENOSCOPY (EGD);  Surgeon: Shaaron Lamar HERO, MD;  [facility];  Service: Endoscopy;  Laterality: N/A;  EGD with enteroscopy  with pediatric colonoscope   ESOPHAGOGASTRODUODENOSCOPY (EGD) WITH PROPOFOL  N/A 11/23/2019   Procedure: ESOPHAGOGASTRODUODENOSCOPY (EGD)  WITH PROPOFOL ;  Surgeon: Shaaron Lamar HERO, MD;  [facility];  Service: Endoscopy;  Laterality: N/A;   ESOPHAGOGASTRODUODENOSCOPY (EGD) WITH PROPOFOL  N/A 10/20/2020   Procedure: ESOPHAGOGASTRODUODENOSCOPY (EGD) WITH PROPOFOL ;  Surgeon: Cindie Carlin POUR, DO;  [facility];  Service: Endoscopy;  Laterality: N/A;   ESOPHAGOGASTRODUODENOSCOPY (EGD) WITH PROPOFOL  N/A 12/05/2020   Procedure: ESOPHAGOGASTRODUODENOSCOPY (EGD) WITH PROPOFOL ;  Surgeon: Golda Claudis PENNER, MD;  [facility];  Service: Endoscopy;  Laterality: N/A;  with enteroscopy   ESOPHAGOGASTRODUODENOSCOPY (EGD) WITH PROPOFOL  N/A 12/11/2020   Procedure: ESOPHAGOGASTRODUODENOSCOPY (EGD) WITH PROPOFOL ;  Surgeon: Cindie Carlin POUR, DO;  [facility];  Service: Endoscopy;  Laterality: N/A;   ESOPHAGOGASTRODUODENOSCOPY (EGD) WITH PROPOFOL  N/A 09/17/2021   Procedure: ESOPHAGOGASTRODUODENOSCOPY (EGD) WITH PROPOFOL ;  Surgeon: Cindie Carlin POUR, DO;  [facility];  Service: Endoscopy;  Laterality: N/A;   ESOPHAGOGASTRODUODENOSCOPY (EGD) WITH PROPOFOL  N/A 12/19/2021   Procedure: ESOPHAGOGASTRODUODENOSCOPY (EGD) WITH PROPOFOL ;  Surgeon: Shaaron Lamar HERO, MD;  [facility];  Service: Endoscopy;  Laterality: N/A;  with pediatric scope   GIVENS CAPSULE STUDY N/A 11/23/2019   Procedure: GIVENS CAPSULE STUDY;  Surgeon: Shaaron Lamar HERO, MD;  [facility];  Service: Endoscopy;  Laterality: N/A;   GIVENS CAPSULE STUDY N/A 10/22/2020   Procedure: GIVENS CAPSULE STUDY;  Surgeon: Cindie Carlin POUR, DO;  [facility];  Service: Endoscopy;  Laterality: N/A;   GIVENS CAPSULE STUDY N/A 09/26/2021   Procedure: GIVENS CAPSULE STUDY;  Surgeon: Avram Lupita BRAVO, MD;  Location: [facility] ENDOSCOPY;  Service: Gastroenterology;  Laterality: N/A;   HEMOSTASIS CLIP PLACEMENT  12/11/2020   Procedure: HEMOSTASIS CLIP PLACEMENT;  Surgeon: Cindie Carlin POUR, DO;  [facility];  Service:  Endoscopy;;   IR ANGIOGRAM SELECTIVE EACH ADDITIONAL VESSEL  12/05/2020   IR ANGIOGRAM SELECTIVE EACH ADDITIONAL VESSEL  12/05/2020   IR ANGIOGRAM SELECTIVE EACH ADDITIONAL VESSEL  12/05/2020   IR ANGIOGRAM SELECTIVE EACH ADDITIONAL VESSEL  12/13/2020   IR ANGIOGRAM SELECTIVE EACH ADDITIONAL VESSEL  12/13/2020   IR ANGIOGRAM VISCERAL SELECTIVE  12/05/2020   IR ANGIOGRAM VISCERAL SELECTIVE  12/13/2020   IR EMBO ART  VEN HEMORR LYMPH EXTRAV  INC GUIDE ROADMAPPING  12/05/2020   IR EMBO ART  VEN HEMORR LYMPH EXTRAV  INC GUIDE ROADMAPPING  12/13/2020   IR RADIOLOGIST EVAL & MGMT  01/11/2021   IR US  GUIDE VASC ACCESS LEFT  12/13/2020   IR US  GUIDE VASC ACCESS RIGHT  12/05/2020   POLYPECTOMY  11/08/2022   Procedure: POLYPECTOMY;  Surgeon: Cindie Carlin POUR, DO;  [facility];  Service: Endoscopy;;   RADIOACTIVE SEED GUIDED EXCISIONAL BREAST BIOPSY Left 08/22/2022   Procedure: RADIOACTIVE SEED GUIDED BRACKETED  EXCISIONAL LEFT BREAST BIOPSY;  Surgeon: Ebbie Cough, MD;  Location[facility];  Service: General;  Laterality: Left;   SUBMUCOSAL TATTOO INJECTION  09/26/2021   Procedure: SUBMUCOSAL TATTOO INJECTION;  Surgeon: Avram Lupita BRAVO, MD;  Location: [facility] ENDOSCOPY;  Service: Gastroenterology;;   Family History  Problem Relation Age of Onset   Colon cancer Mother 56       75's   Breast cancer Sister    Asthma Brother    Colon polyps Neg Hx    Esophageal cancer Neg Hx    Rectal cancer Neg Hx    Stomach cancer Neg Hx    Social History   Socioeconomic History   Marital status: Married    Spouse name: Not on file   Number of children: 1   Years of education: Not on file   Highest education level: Not on file  Occupational History   Occupation: retired  Tobacco Use   Smoking status: Former    Current packs/day: 0.00    Types: Cigarettes    Quit date: 12/23/1984    Years since quitting: 38.2   Smokeless tobacco: Never  Vaping Use   Vaping status: Never Used  Substance and  Sexual Activity   Alcohol use: Yes    Comment: wine occasionally, maybe twice a month   Drug use: No   Sexual activity: Not on file  Other Topics Concern   Not on file  Social History Narrative   Patient is married and retired and has 1 grown child   Social Drivers of Corporate Investment Banker Strain: Low Risk  (04/02/2023)   Overall Financial Resource Strain (CARDIA)    Difficulty of Paying Living Expenses: Not hard at all  Food Insecurity: No Food Insecurity (04/02/2023)  Hunger Vital Sign    Worried About Running Out of Food in the Last Year: Never true    Ran Out of Food in the Last Year: Never true  Transportation Needs: No Transportation Needs (04/02/2023)   PRAPARE - Administrator, Civil Service (Medical): No    Lack of Transportation (Non-Medical): No  Physical Activity: Insufficiently Active (04/02/2023)   Exercise Vital Sign    Days of Exercise per Week: 3 days    Minutes of Exercise per Session: 30 min  Stress: No Stress Concern Present (04/02/2023)   [employer] of Occupational Health - Occupational Stress Questionnaire    Feeling of Stress : Not at all  Social Connections: Moderately Integrated (04/02/2023)   Social Connection and Isolation Panel [NHANES]    Frequency of Communication with Friends and Family: More than three times a week    Frequency of Social Gatherings with Friends and Family: Three times a week    Attends Religious Services: 1 to 4 times per year    Active Member of Clubs or Organizations: No    Attends Banker Meetings: Never    Marital Status: Married    Tobacco Counseling Counseling given: Not Answered   Clinical Intake:  Pre-visit preparation completed: Yes  Pain : No/denies pain     Diabetes: No  How often do you need to have someone help you when you read instructions, pamphlets, or other written materials from your doctor or pharmacy?: 1 - Never  Interpreter Needed?: No  Information entered by ::  Charmaine Bloodgood LPN   Activities of Daily Living    04/02/2023   11:39 AM 11/05/2022    7:52 AM  In your present state of health, do you have any difficulty performing the following activities:  Hearing? 0 0  Vision? 0 0  Difficulty concentrating or making decisions? 0 0  Walking or climbing stairs? 0 1  Dressing or bathing? 0 0  Doing errands, shopping? 0   Preparing Food and eating ? N   Using the Toilet? N   In the past six months, have you accidently leaked urine? N   Do you have problems with loss of bowel control? N   Managing your Medications? N   Managing your Finances? N   Housekeeping or managing your Housekeeping? N     Patient Care Team: Jolinda Norene HERO, DO as PCP - General (Family Medicine) Lavona Agent, MD as PCP - Cardiology (Cardiology) Rogers Hai, MD as Consulting Physician (Hematology) Cindie Carlin POUR, DO as Consulting Physician (Gastroenterology) Ebbie Cough, MD as Consulting Physician (General Surgery)  Indicate any recent Medical Services you may have received from other than [facility] providers in the past year (date may be approximate).     Assessment:   This is a routine wellness examination for [facility].  Hearing/Vision screen Hearing Screening - Comments:: Denies hearing difficulties   Vision Screening - Comments:: No vision problems; will schedule routine eye exam soon     Goals Addressed             This Visit's Progress    Remain active and independent        Depression Screen    04/02/2023   11:33 AM 12/24/2022   10:13 AM 12/02/2022   11:32 AM 11/19/2022    8:06 AM 09/10/2022    9:59 AM 08/21/2022    8:49 AM 07/01/2022    9:01 AM  PHQ 2/9 Scores  PHQ - 2 Score 0 0  0 0 1 0 0  PHQ- 9 Score  2 3 5 2  0 3    Fall Risk    04/02/2023   11:39 AM 12/24/2022   10:13 AM 12/02/2022   11:32 AM 11/19/2022    8:06 AM 09/10/2022    9:59 AM  Fall Risk   Falls in the past year? 0 0 0 0 0  Number falls in past yr: 0 0 0     Injury with Fall? 0 0 0    Risk for fall due to : No Fall Risks No Fall Risks No Fall Risks    Follow up Falls prevention discussed;Education provided;Falls evaluation completed Falls evaluation completed Education provided      MEDICARE RISK AT HOME: Medicare Risk at Home Any stairs in or around the home?: No If so, are there any without handrails?: No Home free of loose throw rugs in walkways, pet beds, electrical cords, etc?: Yes Adequate lighting in your home to reduce risk of falls?: Yes Life alert?: No Use of a cane, walker or w/c?: No Grab bars in the bathroom?: Yes Shower chair or bench in shower?: No Elevated toilet seat or a handicapped toilet?: Yes  TIMED UP AND GO:  Was the test performed?  No    Cognitive Function:        04/02/2023   11:39 AM 03/28/2022    8:22 AM 03/21/2021    8:32 AM  6CIT Screen  What Year? 0 points 0 points 0 points  What month? 0 points 0 points 0 points  What time? 0 points 0 points 0 points  Count back from 20 0 points 0 points 0 points  Months in reverse 0 points 0 points 0 points  Repeat phrase 0 points 0 points 0 points  Total Score 0 points 0 points 0 points    Immunizations Immunization History  Administered Date(s) Administered   Fluad Quad(high Dose 65+) 11/23/2018, 12/14/2019, 12/05/2020   Fluad Trivalent(High Dose 65+) 12/25/2022   Influenza Inj Mdck Quad Pf 01/07/2017   Influenza-Unspecified 01/01/2016, 01/07/2017   Moderna Sars-Covid-2 Vaccination 04/30/2019, 05/29/2019, 01/19/2020, 07/20/2020   Pneumococcal Conjugate-13 03/31/2019   Pneumococcal Polysaccharide-23 02/27/2010, 11/08/2013   Tdap 01/29/2012   Zoster Recombinant(Shingrix ) 08/01/2016, 10/31/2016    TDAP status: Due, Education has been provided regarding the importance of this vaccine. Advised may receive this vaccine at local pharmacy or Health Dept. Aware to provide a copy of the vaccination record if obtained from local pharmacy or Health Dept.  Verbalized acceptance and understanding.  Flu Vaccine status: Up to date  Pneumococcal vaccine status: Up to date  Covid-19 vaccine status: Information provided on how to obtain vaccines.   Qualifies for Shingles Vaccine? Yes   Zostavax completed No   Shingrix  Completed?: Yes  Screening Tests Health Maintenance  Topic Date Due   DTaP/Tdap/Td (2 - Td or Tdap) 01/28/2022   COVID-19 Vaccine (5 - 2024-25 season) 11/24/2022   DEXA SCAN  06/13/2023   Medicare Annual Wellness (AWV)  04/01/2024   Colonoscopy  11/07/2025   Pneumonia Vaccine 35+ Years old  Completed   INFLUENZA VACCINE  Completed   Hepatitis C Screening  Completed   Zoster Vaccines- Shingrix   Completed   HPV VACCINES  Aged Out    Health Maintenance  Health Maintenance Due  Topic Date Due   DTaP/Tdap/Td (2 - Td or Tdap) 01/28/2022   COVID-19 Vaccine (5 - 2024-25 season) 11/24/2022    Colorectal cancer screening: Type of screening: Colonoscopy. Completed 11/08/22.  Repeat every 3 years  Mammogram status: Completed with onc. Repeat every year  Bone Density status: Completed 06/12/21. Results reflect: Bone density results: OSTEOPENIA. Repeat every 2-3 years.  Lung Cancer Screening: (Low Dose CT Chest recommended if Age 70-80 years, 20 pack-year currently smoking OR have quit w/in 15years.) does not qualify.   Lung Cancer Screening Referral: n/a  Additional Screening:  Hepatitis C Screening: does qualify; Completed 02/24/15  Vision Screening: Recommended annual ophthalmology exams for early detection of glaucoma and other disorders of the eye. Is the patient up to date with their annual eye exam?  No  Who is the provider or what is the name of the office in which the patient attends annual eye exams? none If pt is not established with a provider, would they like to be referred to a provider to establish care? No .   Dental Screening: Recommended annual dental exams for proper oral hygiene  Community Resource  Referral / Chronic Care Management: CRR required this visit?  No   CCM required this visit?  No     Plan:     I have personally reviewed and noted the following in the patient's chart:   Medical and social history Use of alcohol, tobacco or illicit drugs  Current medications and supplements including opioid prescriptions. Patient is not currently taking opioid prescriptions. Functional ability and status Nutritional status Physical activity Advanced directives List of other physicians Hospitalizations, surgeries, and ER visits in previous 12 months Vitals Screenings to include cognitive, depression, and falls Referrals and appointments  In addition, I have reviewed and discussed with patient certain preventive protocols, quality metrics, and best practice recommendations. A written personalized care plan for preventive services as well as general preventive health recommendations were provided to patient.     Lavelle Pfeiffer [place], [place]   10/23/7972   After Visit Summary: (MyChart) Due to this being a telephonic visit, the after visit summary with patients personalized plan was offered to patient via MyChart   Nurse Notes: No concerns at this time

## 2023-04-02 NOTE — Patient Instructions (Signed)
 [name] , Thank you for taking time to come for your Medicare Wellness Visit. I appreciate your ongoing commitment to your health goals. Please review the following plan we discussed and let me know if I can assist you in the future.   Referrals/Orders/Follow-Ups/Clinician Recommendations: Aim for 30 minutes of exercise or brisk walking, 6-8 glasses of water , and 5 servings of fruits and vegetables each day.  This is a list of the screening recommended for you and due dates:  Health Maintenance  Topic Date Due   DTaP/Tdap/Td vaccine (2 - Td or Tdap) 01/28/2022   COVID-19 Vaccine (5 - 2024-25 season) 11/24/2022   DEXA scan (bone density measurement)  06/13/2023   Medicare Annual Wellness Visit  04/01/2024   Colon Cancer Screening  11/07/2025   Pneumonia Vaccine  Completed   Flu Shot  Completed   Hepatitis C Screening  Completed   Zoster (Shingles) Vaccine  Completed   HPV Vaccine  Aged Out    Advanced directives: (ACP Link)Information on Advanced Care Planning can be found at [place]  Secretary of [facility] Advance Health Care Directives Advance Health Care Directives ([email or web address]   Next Medicare Annual Wellness Visit scheduled for next year: Yes

## 2023-04-09 ENCOUNTER — Other Ambulatory Visit: Payer: Self-pay | Admitting: Cardiology

## 2023-04-10 ENCOUNTER — Encounter: Payer: Self-pay | Admitting: Gastroenterology

## 2023-04-23 ENCOUNTER — Telehealth: Payer: Medicare Other | Admitting: Physician Assistant

## 2023-04-23 ENCOUNTER — Ambulatory Visit: Payer: Self-pay | Admitting: Family Medicine

## 2023-04-23 DIAGNOSIS — B9689 Other specified bacterial agents as the cause of diseases classified elsewhere: Secondary | ICD-10-CM | POA: Diagnosis not present

## 2023-04-23 DIAGNOSIS — J069 Acute upper respiratory infection, unspecified: Secondary | ICD-10-CM

## 2023-04-23 MED ORDER — BENZONATATE 100 MG PO CAPS: 100.0000 mg | ORAL_CAPSULE | Freq: Three times a day (TID) | ORAL | 0 refills | Status: DC | PRN

## 2023-04-23 MED ORDER — DOXYCYCLINE HYCLATE 100 MG PO TABS: 100.0000 mg | ORAL_TABLET | Freq: Two times a day (BID) | ORAL | 0 refills | Status: DC

## 2023-04-23 MED ORDER — PROMETHAZINE-DM 6.25-15 MG/5ML PO SYRP
5.0000 mL | ORAL_SOLUTION | Freq: Four times a day (QID) | ORAL | 0 refills | Status: DC | PRN
Start: 2023-04-23 — End: 2024-01-20

## 2023-04-23 NOTE — Patient Instructions (Signed)
Stacey Reyes, thank you for joining [name], PA-C for today's virtual visit.  While this provider is not your primary care provider (PCP), if your PCP is located in our provider database this encounter information will be shared with them immediately following your visit.   A [facility] MyChart account gives you access to today's visit and all your visits, tests, and labs performed at [facility] " click here if you don't have a [facility] MyChart account or go to [email or web address]  Consent: (Patient) Stacey Reyes provided verbal consent for this virtual visit at the beginning of the encounter.  Current Medications:  Current Outpatient Medications:    benzonatate (TESSALON) 100 MG capsule, Take 1 capsule (100 mg total) by mouth 3 (three) times daily as needed., Disp: 30 capsule, Rfl: 0   doxycycline (VIBRA-TABS) 100 MG tablet, Take 1 tablet (100 mg total) by mouth 2 (two) times daily., Disp: 20 tablet, Rfl: 0   promethazine-dextromethorphan (PROMETHAZINE-DM) 6.25-15 MG/5ML syrup, Take 5 mLs by mouth 4 (four) times daily as needed., Disp: 118 mL, Rfl: 0   acetaminophen (TYLENOL) 325 MG tablet, Take 325 mg by mouth every 6 (six) hours as needed for moderate pain., Disp: , Rfl:    Ascorbic Acid (VITAMIN C PO), Take 1 tablet by mouth daily., Disp: , Rfl:    bisoprolol (ZEBETA) 5 MG tablet, TAKE ONE-HALF TABLET BY MOUTH  DAILY, Disp: 45 tablet, Rfl: 0   CALCIUM PO, Take 1 tablet by mouth daily., Disp: , Rfl:    Cholecalciferol (VITAMIN D-3 PO), Take 1 capsule by mouth daily., Disp: , Rfl:    COPPER PO, Take 1 capsule by mouth daily., Disp: , Rfl:    cyanocobalamin (CYANOCOBALAMIN) 500 MCG tablet, Take 500 mcg by mouth daily., Disp: , Rfl:    ferrous sulfate 325 (65 FE) MG tablet, TAKE 1 TABLET BY MOUTH EVERY DAY, Disp: 90 tablet, Rfl: 0   fluticasone (FLONASE) 50 MCG/ACT nasal spray, Place 2 sprays into both nostrils daily., Disp: 16 g, Rfl: 6    MAGNESIUM PO, Take 1 tablet by mouth daily., Disp: , Rfl:    meloxicam (MOBIC) 15 MG tablet, Take 0.5-1 tablets (7.5-15 mg total) by mouth daily as needed for pain (use sparingly. can increase GI bleeding.)., Disp: 100 tablet, Rfl: 1   Na Sulfate-K Sulfate-Mg Sulf 17.5-3.13-1.6 GM/177ML SOLN, As directed, Disp: 354 mL, Rfl: 0   pantoprazole (PROTONIX) 40 MG tablet, TAKE 1 TABLET BY MOUTH TWICE  DAILY, Disp: 200 tablet, Rfl: 2   rosuvastatin (CRESTOR) 10 MG tablet, Take 1 tablet (10 mg total) by mouth at bedtime., Disp: 100 tablet, Rfl: 3   Vitamin D, Ergocalciferol, (DRISDOL) 1.25 MG (50000 UNIT) CAPS capsule, TAKE 1 CAPSULE BY MOUTH ONCE  WEEKLY, Disp: 15 capsule, Rfl: 2   VITAMIN E PO, Take 1 tablet by mouth daily., Disp: , Rfl:    Medications ordered in this encounter:  Meds ordered this encounter  Medications   doxycycline (VIBRA-TABS) 100 MG tablet    Sig: Take 1 tablet (100 mg total) by mouth 2 (two) times daily.    Dispense:  20 tablet    Refill:  0    Supervising Provider:   Merrilee Jansky [[id number]]   promethazine-dextromethorphan (PROMETHAZINE-DM) 6.25-15 MG/5ML syrup    Sig: Take 5 mLs by mouth 4 (four) times daily as needed.    Dispense:  118 mL    Refill:  0    Supervising Provider:   Merrilee Jansky [  [id number]]   benzonatate (TESSALON) 100 MG capsule    Sig: Take 1 capsule (100 mg total) by mouth 3 (three) times daily as needed.    Dispense:  30 capsule    Refill:  0    Supervising Provider:   Merrilee Jansky [[id number]]     *If you need refills on other medications prior to your next appointment, please contact your pharmacy*  Follow-Up: Call back or seek an in-person evaluation if the symptoms worsen or if the condition fails to improve as anticipated.  [facility] [phone]  Other Instructions Upper Respiratory Infection, Adult An upper respiratory infection (URI) is a common viral infection of the nose, throat, and upper air passages that  lead to the lungs. The most common type of URI is the common cold. URIs usually get better on their own, without medical treatment. What are the causes? A URI is caused by a virus. You may catch a virus by: Breathing in droplets from an infected person's cough or sneeze. Touching something that has been exposed to the virus (is contaminated) and then touching your mouth, nose, or eyes. What increases the risk? You are more likely to get a URI if: You are very young or very old. You have close contact with others, such as at work, school, or a health care facility. You smoke. You have long-term (chronic) heart or lung disease. You have a weakened disease-fighting system (immune system). You have nasal allergies or asthma. You are experiencing a lot of stress. You have poor nutrition. What are the signs or symptoms? A URI usually involves some of the following symptoms: Runny or stuffy (congested) nose. Cough. Sneezing. Sore throat. Headache. Fatigue. Fever. Loss of appetite. Pain in your forehead, behind your eyes, and over your cheekbones (sinus pain). Muscle aches. Redness or irritation of the eyes. Pressure in the ears or face. How is this diagnosed? This condition may be diagnosed based on your medical history and symptoms, and a physical exam. Your health care provider may use a swab to take a mucus sample from your nose (nasal swab). This sample can be tested to determine what virus is causing the illness. How is this treated? URIs usually get better on their own within 7-10 days. Medicines cannot cure URIs, but your health care provider may recommend certain medicines to help relieve symptoms, such as: Over-the-counter cold medicines. Cough suppressants. Coughing is a type of defense against infection that helps to clear the respiratory system, so take these medicines only as recommended by your health care provider. Fever-reducing medicines. Follow these instructions at  home: Activity Rest as needed. If you have a fever, stay home from work or school until your fever is gone or until your health care provider says your URI cannot spread to other people (is no longer contagious). Your health care provider may have you wear a face mask to prevent your infection from spreading. Relieving symptoms Gargle with a mixture of salt and water 3-4 times a day or as needed. To make salt water, completely dissolve -1 tsp (3-6 g) of salt in 1 cup (237 mL) of warm water. Use a cool-mist humidifier to add moisture to the air. This can help you breathe more easily. Eating and drinking  Drink enough fluid to keep your urine pale yellow. Eat soups and other clear broths. General instructions  Take over-the-counter and prescription medicines only as told by your health care provider. These include cold medicines, fever reducers, and cough  suppressants. Do not use any products that contain nicotine or tobacco. These products include cigarettes, chewing tobacco, and vaping devices, such as e-cigarettes. If you need help quitting, ask your health care provider. Stay away from secondhand smoke. Stay up to date on all immunizations, including the yearly (annual) flu vaccine. Keep all follow-up visits. This is important. How to prevent the spread of infection to others URIs can be contagious. To prevent the infection from spreading: Wash your hands with soap and water for at least 20 seconds. If soap and water are not available, use hand sanitizer. Avoid touching your mouth, face, eyes, or nose. Cough or sneeze into a tissue or your sleeve or elbow instead of into your hand or into the air.  Contact a health care provider if: You are getting worse instead of better. You have a fever or chills. Your mucus is brown or red. You have yellow or brown discharge coming from your nose. You have pain in your face, especially when you bend forward. You have swollen neck glands. You  have pain while swallowing. You have white areas in the back of your throat. Get help right away if: You have shortness of breath that gets worse. You have severe or persistent: Headache. Ear pain. Sinus pain. Chest pain. You have chronic lung disease along with any of the following: Making high-pitched whistling sounds when you breathe, most often when you breathe out (wheezing). Prolonged cough (more than 14 days). Coughing up blood. A change in your usual mucus. You have a stiff neck. You have changes in your: Vision. Hearing. Thinking. Mood. These symptoms may be an emergency. Get help right away. Call 911. Do not wait to see if the symptoms will go away. Do not drive yourself to the hospital. Summary An upper respiratory infection (URI) is a common infection of the nose, throat, and upper air passages that lead to the lungs. A URI is caused by a virus. URIs usually get better on their own within 7-10 days. Medicines cannot cure URIs, but your health care provider may recommend certain medicines to help relieve symptoms. This information is not intended to replace advice given to you by your health care provider. Make sure you discuss any questions you have with your health care provider. Document Revised: 10/11/2020 Document Reviewed: 10/11/2020 Elsevier Patient Education  2024 Elsevier Inc.   If you have been instructed to have an in-person evaluation today at a local Urgent Care facility, please use the link below. It will take you to a list of all of our available [facility], including address, phone number and hours of operation. Please do not delay care.  [facility]  If you or a family member do not have a primary care provider, use the link below to schedule a visit and establish care. When you choose a [facility] primary care physician or advanced practice provider, you gain a long-term partner in health. Find a Primary Care  Provider  Learn more about [facility] in-office and virtual care options:  - Get Care Now

## 2023-04-23 NOTE — Progress Notes (Signed)
Virtual Visit Consent   Stacey Reyes, you are scheduled for a virtual visit with a [facility] provider today. Just as with appointments in the office, your consent must be obtained to participate. Your consent will be active for this visit and any virtual visit you may have with one of our providers in the next 365 days. If you have a MyChart account, a copy of this consent can be sent to you electronically.  As this is a virtual visit, video technology does not allow for your provider to perform a traditional examination. This may limit your provider's ability to fully assess your condition. If your provider identifies any concerns that need to be evaluated in person or the need to arrange testing (such as labs, EKG, etc.), we will make arrangements to do so. Although advances in technology are sophisticated, we cannot ensure that it will always work on either your end or our end. If the connection with a video visit is poor, the visit may have to be switched to a telephone visit. With either a video or telephone visit, we are not always able to ensure that we have a secure connection.  By engaging in this virtual visit, you consent to the provision of healthcare and authorize for your insurance to be billed (if applicable) for the services provided during this visit. Depending on your insurance coverage, you may receive a charge related to this service.  I need to obtain your verbal consent now. Are you willing to proceed with your visit today? Stacey Reyes has provided verbal consent on 04/23/2023 for a virtual visit (video or telephone). Stacey Loveless, PA-C  Date: 04/23/2023 12:39 PM  Virtual Visit via Video Note   IMargaretann Reyes, connected with  Stacey Reyes  ([id number], 01-03-1946) on 04/23/23 at 12:15 PM EST by a video-enabled telemedicine application and verified that I am speaking with the correct person using two identifiers.  Location: Patient: Virtual Visit  Location Patient: Home Provider: Virtual Visit Location Provider: Home Office   I discussed the limitations of evaluation and management by telemedicine and the availability of in person appointments. The patient expressed understanding and agreed to proceed.    Interactive audio and video communications were attempted, although failed due to patient's inability to connect to video. Continued visit with audio only interaction with patient agreement.   History of Present Illness: Stacey Reyes is a 78 y.o. who identifies as a female who was assigned female at birth, and is being seen today for URI symptoms.  HPI: URI  This is a recurrent problem. The current episode started in the past 7 days (Had similar symptoms and was seen Mar 21, 2023, virtually. Prescribed Doxycycline, Tessalon perles, Promethazine DM; requesting same medications as they helped previously; Current symptoms started on Friday, 04/18/23, starting with sore throat). The problem has been gradually worsening. There has been no fever. Associated symptoms include chest pain (from cough), congestion, coughing, headaches, rhinorrhea (clear but thick), a sore throat (first day, now improved, but has a tickle that remains) and wheezing (at night with lying down). Pertinent negatives include no diarrhea, ear pain, nausea, plugged ear sensation, sinus pain or vomiting. Treatments tried: nyquil, dayquil, cough drops. The treatment provided no relief.      Problems:  Patient Active Problem List   Diagnosis Date Noted   GERD (gastroesophageal reflux disease) 12/18/2021   Gastrointestinal hemorrhage    Obesity (BMI 30-39.9) 10/06/2021   History of GI bleed 09/25/2021  Hypertrophic cardiomyopathy (HCC) 01/17/2020   Iron deficiency anemia due to chronic blood loss 12/21/2019   Dyspnea on exertion 07/05/2019   B12 deficiency 03/25/2019   Absolute anemia 03/24/2019   Osteopenia after menopause 04/22/2018   Cardiac murmur due to  mitral valve disorder 03/03/2018   Heart murmur 03/03/2018   LVH (left ventricular hypertrophy) 11/20/2015   Arrhythmia 10/11/2015   Hypertension, essential 02/24/2015   Mixed hyperlipidemia 02/24/2015   History of colonic polyps 01/07/2012    Allergies:  Allergies  Allergen Reactions   Ciprofloxacin    Penicillin G Hives   Sulfa Antibiotics Rash   Medications:  Current Outpatient Medications:    acetaminophen (TYLENOL) 325 MG tablet, Take 325 mg by mouth every 6 (six) hours as needed for moderate pain., Disp: , Rfl:    Ascorbic Acid (VITAMIN C PO), Take 1 tablet by mouth daily., Disp: , Rfl:    benzonatate (TESSALON) 100 MG capsule, Take 1 capsule (100 mg total) by mouth 3 (three) times daily as needed., Disp: 30 capsule, Rfl: 0   bisoprolol (ZEBETA) 5 MG tablet, TAKE ONE-HALF TABLET BY MOUTH  DAILY, Disp: 45 tablet, Rfl: 0   CALCIUM PO, Take 1 tablet by mouth daily., Disp: , Rfl:    Cholecalciferol (VITAMIN D-3 PO), Take 1 capsule by mouth daily., Disp: , Rfl:    COPPER PO, Take 1 capsule by mouth daily., Disp: , Rfl:    cyanocobalamin (CYANOCOBALAMIN) 500 MCG tablet, Take 500 mcg by mouth daily., Disp: , Rfl:    doxycycline (VIBRA-TABS) 100 MG tablet, Take 1 tablet (100 mg total) by mouth 2 (two) times daily., Disp: 20 tablet, Rfl: 0   ferrous sulfate 325 (65 FE) MG tablet, TAKE 1 TABLET BY MOUTH EVERY DAY, Disp: 90 tablet, Rfl: 0   fluticasone (FLONASE) 50 MCG/ACT nasal spray, Place 2 sprays into both nostrils daily., Disp: 16 g, Rfl: 6   MAGNESIUM PO, Take 1 tablet by mouth daily., Disp: , Rfl:    meloxicam (MOBIC) 15 MG tablet, Take 0.5-1 tablets (7.5-15 mg total) by mouth daily as needed for pain (use sparingly. can increase GI bleeding.)., Disp: 100 tablet, Rfl: 1   Na Sulfate-K Sulfate-Mg Sulf 17.5-3.13-1.6 GM/177ML SOLN, As directed, Disp: 354 mL, Rfl: 0   pantoprazole (PROTONIX) 40 MG tablet, TAKE 1 TABLET BY MOUTH TWICE  DAILY, Disp: 200 tablet, Rfl: 2    promethazine-dextromethorphan (PROMETHAZINE-DM) 6.25-15 MG/5ML syrup, Take 5 mLs by mouth 4 (four) times daily as needed., Disp: 118 mL, Rfl: 0   rosuvastatin (CRESTOR) 10 MG tablet, Take 1 tablet (10 mg total) by mouth at bedtime., Disp: 100 tablet, Rfl: 3   Vitamin D, Ergocalciferol, (DRISDOL) 1.25 MG (50000 UNIT) CAPS capsule, TAKE 1 CAPSULE BY MOUTH ONCE  WEEKLY, Disp: 15 capsule, Rfl: 2   VITAMIN E PO, Take 1 tablet by mouth daily., Disp: , Rfl:   Observations/Objective: Patient is well-developed, well-nourished in no acute distress.  Resting comfortably at home.  Head is normocephalic, atraumatic.  No labored breathing.  Speech is clear and coherent with logical content.  Patient is alert and oriented at baseline.    Assessment and Plan: 1. Bacterial upper respiratory infection (Primary) - doxycycline (VIBRA-TABS) 100 MG tablet; Take 1 tablet (100 mg total) by mouth 2 (two) times daily.  Dispense: 20 tablet; Refill: 0 - promethazine-dextromethorphan (PROMETHAZINE-DM) 6.25-15 MG/5ML syrup; Take 5 mLs by mouth 4 (four) times daily as needed.  Dispense: 118 mL; Refill: 0 - benzonatate (TESSALON) 100 MG capsule; Take 1  capsule (100 mg total) by mouth 3 (three) times daily as needed.  Dispense: 30 capsule; Refill: 0  - Worsening over a week despite OTC medications - Will treat with Doxycycline, Promethazine DM and tessalon perles - Can continue Mucinex  - Push fluids.  - Rest.  - Steam and humidifier can help - Seek in person evaluation if worsening or symptoms fail to improve    Follow Up Instructions: I discussed the assessment and treatment plan with the patient. The patient was provided an opportunity to ask questions and all were answered. The patient agreed with the plan and demonstrated an understanding of the instructions.  A copy of instructions were sent to the patient via MyChart unless otherwise noted below.    The patient was advised to call back or seek an in-person  evaluation if the symptoms worsen or if the condition fails to improve as anticipated.   Stacey Loveless, PA-C   Stacey Reyes, [place]

## 2023-04-23 NOTE — Telephone Encounter (Signed)
Copied from CRM [id number]. Topic: Clinical - Red Word Triage >> Apr 23, 2023  8:11 AM Gildardo Pounds wrote: Red Word that prompted transfer to Nurse Triage: Dec 27th, had a bad cold and the doctor prescribed medications that helped. Patient is now coughing until she is hurting. clear jelly stuff when blowing nose. can't sleep because of cough. over the counter medications not working. getting worse. chest hurting when cough. callback number is [phone]  benzonatate (TESSALON) 200 MG capsule doxycycline (VIBRA-TABS) 100 MG tablet promethazine-dextromethorphan (PROMETHAZINE-DM) 6.25-15 MG/5ML syrup  Chief Complaint: cold like symptoms Symptoms: cough, runny nose Frequency: constant Pertinent Negatives: Patient denies fever, sob Disposition: [] ED /[x] Urgent Care (no appt availability in office) / [] Appointment(In office/virtual)/ []  [facility]/ [] Home Care/ [] Refused Recommended Disposition /[] [facility]/ []  Follow-up with PCP Additional Notes: Patient extremely upset, wants apt with pcp or wants pcp to call in medicine for her.  States she had this in Dec. And would like meds called in.  Triage completed and virtual uc visit scheduled.  Care advice given, denies questions, instructed to go to er if becomes worse.   Reason for Disposition  Coughing up rusty-colored (reddish-brown) sputum  Answer Assessment - Initial Assessment Questions 1. ONSET: "When did the cough begin?"      Started Saturday and is getting worse 2. SEVERITY: "How bad is the cough today?"      bad 3. SPUTUM: "Describe the color of your sputum" (none, dry cough; clear, white, yellow, green)     From nose it is clear 4. HEMOPTYSIS: "Are you coughing up any blood?" If so ask: "How much?" (flecks, streaks, tablespoons, etc.)     denies 5. DIFFICULTY BREATHING: "Are you having difficulty breathing?" If Yes, ask: "How bad is it?" (e.g., mild, moderate, severe)    - MILD: No SOB at rest, mild SOB with  walking, speaks normally in sentences, can lie down, no retractions, pulse < 100.    - MODERATE: SOB at rest, SOB with minimal exertion and prefers to sit, cannot lie down flat, speaks in phrases, mild retractions, audible wheezing, pulse 100-120.    - SEVERE: Very SOB at rest, speaks in single words, struggling to breathe, sitting hunched forward, retractions, pulse > 120      denies 6. FEVER: "Do you have a fever?" If Yes, ask: "What is your temperature, how was it measured, and when did it start?"     denies 7. CARDIAC HISTORY: "Do you have any history of heart disease?" (e.g., heart attack, congestive heart failure)      denies 8. LUNG HISTORY: "Do you have any history of lung disease?"  (e.g., pulmonary embolus, asthma, emphysema)     denies 9. PE RISK FACTORS: "Do you have a history of blood clots?" (or: recent major surgery, recent prolonged travel, bedridden)     denies 10. OTHER SYMPTOMS: "Do you have any other symptoms?" (e.g., runny nose, wheezing, chest pain)       Denies.  Protocols used: Cough - Acute Productive-A-AH

## 2023-05-07 ENCOUNTER — Other Ambulatory Visit: Payer: Medicare Other

## 2023-05-07 ENCOUNTER — Encounter: Payer: Self-pay | Admitting: Family Medicine

## 2023-05-07 ENCOUNTER — Other Ambulatory Visit: Payer: Self-pay | Admitting: Family Medicine

## 2023-05-07 DIAGNOSIS — R195 Other fecal abnormalities: Secondary | ICD-10-CM | POA: Diagnosis not present

## 2023-05-07 LAB — HEMOGLOBIN, FINGERSTICK: Hemoglobin: 13.7 g/dL (ref 11.1–15.9)

## 2023-05-07 LAB — CBC
Hematocrit: 40.3 % (ref 34.0–46.6)
Hemoglobin: 13.2 g/dL (ref 11.1–15.9)
MCH: 30.2 pg (ref 26.6–33.0)
MCHC: 32.8 g/dL (ref 31.5–35.7)
MCV: 92 fL (ref 79–97)
Platelets: 210 10*3/uL (ref 150–450)
RBC: 4.37 x10E6/uL (ref 3.77–5.28)
RDW: 12.9 % (ref 11.7–15.4)
WBC: 8.2 10*3/uL (ref 3.4–10.8)

## 2023-05-08 ENCOUNTER — Ambulatory Visit: Payer: Medicare Other

## 2023-05-08 ENCOUNTER — Other Ambulatory Visit: Payer: Self-pay

## 2023-05-08 ENCOUNTER — Encounter: Payer: Self-pay | Admitting: Family Medicine

## 2023-05-08 VITALS — BP 131/74 | HR 60 | Temp 97.6°F | Ht 65.0 in | Wt 185.0 lb

## 2023-05-08 DIAGNOSIS — R195 Other fecal abnormalities: Secondary | ICD-10-CM | POA: Diagnosis not present

## 2023-05-08 DIAGNOSIS — J310 Chronic rhinitis: Secondary | ICD-10-CM | POA: Diagnosis not present

## 2023-05-08 DIAGNOSIS — R197 Diarrhea, unspecified: Secondary | ICD-10-CM

## 2023-05-08 DIAGNOSIS — R052 Subacute cough: Secondary | ICD-10-CM | POA: Diagnosis not present

## 2023-05-08 MED ORDER — LEVOCETIRIZINE DIHYDROCHLORIDE 5 MG PO TABS
5.0000 mg | ORAL_TABLET | Freq: Every evening | ORAL | 3 refills | Status: AC
Start: 2023-05-08 — End: ?

## 2023-05-08 NOTE — Progress Notes (Signed)
Acute Office Visit  Subjective:     Patient ID: Stacey Reyes, female    DOB: 04/20/1945, 78 y.o.   MRN: [id number]  Chief Complaint  Patient presents with   Diarrhea   Cough   Nasal Congestion    Diarrhea  This is a new problem. Episode onset: 3 days. The problem occurs 2 to 4 times per day. The problem has been gradually improving. Diarrhea characteristics: dark. The patient states that diarrhea does not awaken her from sleep. Associated symptoms include coughing. Pertinent negatives include no abdominal pain, bloating, chills, fever, increased  flatus or vomiting. She has tried bismuth subsalicylate for the symptoms. The treatment provided mild relief. There is no history of inflammatory bowel disease, irritable bowel syndrome or a recent abdominal surgery.  Cough This is a new problem. Episode onset: 2 months. The problem has been gradually improving. The cough is Non-productive. Associated symptoms include nasal congestion and rhinorrhea. Pertinent negatives include no chest pain, chills, ear congestion, ear pain, fever, hemoptysis, postnasal drip, sore throat, shortness of breath or wheezing. She has tried OTC cough suppressant and prescription cough suppressant (2 rounds of abx) for the symptoms. The treatment provided mild relief. There is no history of asthma, bronchitis, COPD or pneumonia.   Denies heartburn, edema.   Hx of GI bleed. She brought an FOBT today. Hemoglobin was checked yesterday.   Review of Systems  Constitutional:  Negative for chills and fever.  HENT:  Positive for rhinorrhea. Negative for ear pain, postnasal drip and sore throat.   Respiratory:  Positive for cough. Negative for hemoptysis, shortness of breath and wheezing.   Cardiovascular:  Negative for chest pain.  Gastrointestinal:  Positive for diarrhea. Negative for abdominal pain, bloating, flatus and vomiting.        Objective:    BP 131/74   Pulse 60   Temp 97.6 F (36.4 C) (Temporal)    Ht 5\' 5"  (1.651 m)   Wt 185 lb (83.9 kg)   SpO2 96%   BMI 30.79 kg/m    Physical Exam Vitals and nursing note reviewed.  Constitutional:      General: She is not in acute distress.    Appearance: She is not ill-appearing, toxic-appearing or diaphoretic.  HENT:     Right Ear: Tympanic membrane, ear canal and external ear normal.     Left Ear: Tympanic membrane, ear canal and external ear normal.     Nose: Mucosal edema, congestion and rhinorrhea present. Rhinorrhea is clear.     Mouth/Throat:     Mouth: Mucous membranes are moist.     Pharynx: Oropharynx is clear. No oropharyngeal exudate or posterior oropharyngeal erythema.  Eyes:     General:        Right eye: No discharge.        Left eye: No discharge.     Conjunctiva/sclera: Conjunctivae normal.  Cardiovascular:     Rate and Rhythm: Normal rate and regular rhythm.     Heart sounds: Normal heart sounds. No murmur heard. Pulmonary:     Effort: Pulmonary effort is normal. No respiratory distress.     Breath sounds: Normal breath sounds. No wheezing, rhonchi or rales.  Abdominal:     General: Bowel sounds are normal. There is no distension.     Palpations: Abdomen is soft.     Tenderness: There is no abdominal tenderness. There is no right CVA tenderness, left CVA tenderness, guarding or rebound.  Musculoskeletal:     Right lower  leg: No edema.     Left lower leg: No edema.  Skin:    General: Skin is warm and dry.  Neurological:     General: No focal deficit present.     Mental Status: She is alert and oriented to person, place, and time.  Psychiatric:        Mood and Affect: Mood normal.        Behavior: Behavior normal.     No results found for any visits on 05/08/23.      Assessment & Plan:   Andreina was seen today for diarrhea, cough and nasal congestion.  Diagnoses and all orders for this visit:  Subacute cough Rhinitis, chronic Try xyzal as below.  -     levocetirizine (XYZAL) 5 MG tablet; Take 1  tablet (5 mg total) by mouth every evening.  Diarrhea, unspecified type X3 days. Improving. Discussed viral etiology. Discussed symptomatic care and return precautions.   Dark stools FOBT dropped off today. Reviewed normal CBC from yesterday.   Return if symptoms worsen or fail to improve.  The patient indicates understanding of these issues and agrees with the plan.  Gabriel Earing, FNP

## 2023-05-09 LAB — FECAL OCCULT BLOOD, IMMUNOCHEMICAL: Fecal Occult Bld: POSITIVE — AB

## 2023-05-12 ENCOUNTER — Encounter: Payer: Self-pay | Admitting: Family Medicine

## 2023-06-06 ENCOUNTER — Other Ambulatory Visit: Payer: Self-pay | Admitting: Nurse Practitioner

## 2023-06-06 DIAGNOSIS — J069 Acute upper respiratory infection, unspecified: Secondary | ICD-10-CM

## 2023-06-06 DIAGNOSIS — R300 Dysuria: Secondary | ICD-10-CM

## 2023-06-07 ENCOUNTER — Other Ambulatory Visit: Payer: Self-pay | Admitting: Cardiology

## 2023-06-24 ENCOUNTER — Other Ambulatory Visit: Payer: Self-pay | Admitting: Family Medicine

## 2023-07-16 ENCOUNTER — Telehealth: Payer: Self-pay | Admitting: Family Medicine

## 2023-07-16 ENCOUNTER — Encounter: Payer: Self-pay | Admitting: Family Medicine

## 2023-07-16 ENCOUNTER — Ambulatory Visit: Payer: Medicare Other | Admitting: Family Medicine

## 2023-07-16 VITALS — BP 111/66 | HR 65 | Temp 98.3°F | Ht 65.0 in | Wt 189.6 lb

## 2023-07-16 DIAGNOSIS — M5481 Occipital neuralgia: Secondary | ICD-10-CM | POA: Diagnosis not present

## 2023-07-16 DIAGNOSIS — D500 Iron deficiency anemia secondary to blood loss (chronic): Secondary | ICD-10-CM | POA: Diagnosis not present

## 2023-07-16 DIAGNOSIS — Z23 Encounter for immunization: Secondary | ICD-10-CM

## 2023-07-16 DIAGNOSIS — J301 Allergic rhinitis due to pollen: Secondary | ICD-10-CM

## 2023-07-16 MED ORDER — GABAPENTIN 300 MG PO CAPS
300.0000 mg | ORAL_CAPSULE | Freq: Every day | ORAL | 3 refills | Status: AC
Start: 2023-07-16 — End: ?

## 2023-07-16 NOTE — Progress Notes (Signed)
 Subjective: CC: 53-month follow-up PCP: Stacey Reyes, Stacey Reyes [id number] [name] is a 78 y.o. female presenting to clinic today for:  1.  History of GI bleed, blood loss anemia She reports clinically feeling well.  No fatigue, shortness of breath or other concerning features.  She seen no bleeding at all.  She did have some darkened stools back in February and returned FOBT which was positive.  However, she reports that she has talked to Dr. Mordechai April and there were no further interventions recommended.  2.  Skin sensitivity She reports the scalp has been intermittently burning and tingling.  This only occurs at bedtime.  She wakes up, walks around and then it seems to resolve.  She reports no changes in medication.  No preceding injury.  She has had some increased allergy symptoms but she is utilizing her allergy medicines.   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, c/w HOCM ; no identifiable gene   Blood transfusion without reported diagnosis    Cataract    removed bilateral   Chronic cystitis    Diverticulitis    GERD (gastroesophageal reflux disease)    Heart murmur    Hyperlipidemia    Hypertension    Personal history of colonic polyps-adenomas 01/07/2012   2009 - 2 diminutive adenomas (prior polyps also) 01/07/2012 - 2 diminutive adenomas      Current Outpatient Medications:    acetaminophen  (TYLENOL ) 325 MG tablet, Take 325 mg by mouth every 6 (six) hours as needed for moderate pain., Disp: , Rfl:    Ascorbic Acid (VITAMIN C PO), Take 1 tablet by mouth daily., Disp: , Rfl:    bisoprolol  (ZEBETA ) 5 MG tablet, TAKE ONE-HALF TABLET BY MOUTH  DAILY,  Disp: 45 tablet, Rfl: 0   CALCIUM  PO, Take 1 tablet by mouth daily., Disp: , Rfl:    Cholecalciferol (VITAMIN D -3 PO), Take 1 capsule by mouth daily., Disp: , Rfl:    COPPER  PO, Take 1 capsule by mouth daily., Disp: , Rfl:    cyanocobalamin  (CYANOCOBALAMIN ) 500 MCG tablet, Take 500 mcg by mouth daily., Disp: , Rfl:    ferrous sulfate  325 (65 FE) MG tablet, TAKE 1 TABLET BY MOUTH EVERY DAY, Disp: 90 tablet, Rfl: 0   fluticasone  (FLONASE ) 50 MCG/ACT nasal spray, SPRAY 2 SPRAYS INTO EACH NOSTRIL EVERY DAY, Disp: 48 mL, Rfl: 2   levocetirizine (XYZAL ) 5 MG tablet, Take 1 tablet (5 mg total) by mouth every evening., Disp: 90 tablet, Rfl: 3   MAGNESIUM  PO, Take 1 tablet by mouth daily., Disp: , Rfl:    meloxicam  (MOBIC ) 15 MG tablet, Take 0.5-1 tablets (7.5-15 mg total) by mouth daily as needed for pain (use sparingly. can increase GI bleeding.)., Disp: 100 tablet, Rfl: 1   Na Sulfate-K Sulfate-Mg Sulf 17.5-3.13-1.6 GM/177ML SOLN, As directed, Disp: 354 mL, Rfl: 0   pantoprazole  (PROTONIX ) 40 MG tablet, TAKE 1 TABLET BY MOUTH TWICE  DAILY, Disp: 200 tablet, Rfl: 0   promethazine -dextromethorphan (PROMETHAZINE -DM) 6.25-15 MG/5ML syrup, Take 5 mLs by mouth 4 (four) times daily as needed., Disp: 118 mL, Rfl: 0   rosuvastatin  (CRESTOR ) 10 MG tablet,  Take 1 tablet (10 mg total) by mouth at bedtime., Disp: 100 tablet, Rfl: 3   Vitamin D , Ergocalciferol , (DRISDOL ) 1.25 MG (50000 UNIT) CAPS capsule, TAKE 1 CAPSULE BY MOUTH ONCE  WEEKLY, Disp: 15 capsule, Rfl: 2   VITAMIN E  PO, Take 1 tablet by mouth daily., Disp: , Rfl:  Social History   Socioeconomic History   Marital status: Married    Spouse name: Not on file   Number of children: 1   Years of education: Not on file   Highest education level: Not on file  Occupational History   Occupation: retired  Tobacco Use   Smoking status: Former    Current packs/day: 0.00    Types: Cigarettes    Quit date: 12/23/1984    Years since quitting: 38.5    Smokeless tobacco: Never  Vaping Use   Vaping status: Never Used  Substance and Sexual Activity   Alcohol use: Yes    Comment: wine occasionally, maybe twice a month   Drug use: No   Sexual activity: Not on file  Other Topics Concern   Not on file  Social History Narrative   Patient is married and retired and has 1 grown child   Social Drivers of Corporate investment banker Strain: Low Risk  (04/02/2023)   Overall Financial Resource Strain (CARDIA)    Difficulty of Paying Living Expenses: Not hard at all  Food Insecurity: No Food Insecurity (04/02/2023)   Hunger Vital Sign    Worried About Running Out of Food in the Last Year: Never true    Ran Out of Food in the Last Year: Never true  Transportation Needs: No Transportation Needs (04/02/2023)   PRAPARE - Administrator, Civil Service (Medical): No    Lack of Transportation (Non-Medical): No  Physical Activity: Insufficiently Active (04/02/2023)   Exercise Vital Sign    Days of Exercise per Week: 3 days    Minutes of Exercise per Session: 30 min  Stress: No Stress Concern Present (04/02/2023)   [employer] of Occupational Health - Occupational Stress Questionnaire    Feeling of Stress : Not at all  Social Connections: Moderately Integrated (04/02/2023)   Social Connection and Isolation Panel [NHANES]    Frequency of Communication with Friends and Family: More than three times a week    Frequency of Social Gatherings with Friends and Family: Three times a week    Attends Religious Services: 1 to 4 times per year    Active Member of Clubs or Organizations: No    Attends Banker Meetings: Never    Marital Status: Married  Catering manager Violence: Not At Risk (04/02/2023)   Humiliation, Afraid, Rape, and Kick questionnaire    Fear of Current or Ex-Partner: No    Emotionally Abused: No    Physically Abused: No    Sexually Abused: No   Family History  Problem Relation Age of Onset   Colon cancer Mother 55        10's   Breast cancer Sister    Asthma Brother    Colon polyps Neg Hx    Esophageal cancer Neg Hx    Rectal cancer Neg Hx    Stomach cancer Neg Hx     Objective: Office vital signs reviewed. BP 111/66   Pulse 65   Temp 98.3 F (36.8 C)   Ht 5\' 5"  (1.651 m)   Wt 189 lb 9.6 oz (86 kg)   SpO2 96%   BMI  31.55 kg/m   Physical Examination:  General: Awake, alert, well nourished, No acute distress HEENT: No conjunctival pallor Cardio: regular rate and rhythm, S1S2 heard, systolic murmurs appreciated Pulm: clear to auscultation bilaterally, no wheezes, rhonchi or rales; normal work of breathing on room air Neuro: Cranial nerves II through XII grossly intact.  No skin lesions appreciated on the scalp  Assessment/ Plan: 78 y.o. female   Iron deficiency anemia secondary to blood loss (chronic)  Seasonal allergic rhinitis due to pollen  Bilateral occipital neuralgia - Plan: gabapentin  (NEURONTIN ) 300 MG capsule  Has standing CBC but clinically asymptomatic and her last couple of hemoglobins were up within normal range so no labs collected today  She will continue current regimen for allergies but contact me should any symptoms get worse or she need any adjustment of her medications.    I will trial her on gabapentin  300 mg at nighttime.  I suspect her symptoms are related to an occipital neuralgia.  She received her tetanus shot today and will set up her DEXA scan for next visit   Abiel Antrim M Wanya Bangura, Stacey Reyes [facility] [phone]

## 2023-07-16 NOTE — Patient Instructions (Signed)
 Occipital Neuralgia  Occipital neuralgia is a type of headache that causes brief episodes of very bad pain in the back of the head. Pain from occipital neuralgia may spread (radiate) to other parts of the head. These headaches may be caused by irritation of the nerves that leave the spinal cord high up in the neck, just below the base of the skull (occipital nerves). The occipital nerves transmit sensations from the back of the head, the top of the head, and the areas behind the ears. What are the causes? This condition can occur without any known cause (primary headache syndrome). In other cases, this condition is caused by pressure on or irritation of one of the two occipital nerves. Pressure and irritation may be due to: Muscle spasm in the neck. Neck injury. Wear and tear of the vertebrae in the neck (osteoarthritis). Disease of the disks that separate the vertebrae. Swollen blood vessels that put pressure on the occipital nerves. Infections. Tumors. Diabetes. What are the signs or symptoms? This condition causes brief burning, stabbing, electric, shocking, or shooting pain in the back of the head that can radiate to the top of the head. It can happen on one side or both sides of the head. It can also cause: Pain behind the eye. Pain triggered by neck movement or hair brushing. Scalp tenderness. Aching in the back of the head between episodes of very bad pain. Pain that gets worse with exposure to bright lights. How is this diagnosed? Your health care provider may diagnose the condition based on a physical exam and your symptoms. Tests may be done, such as: Imaging studies of the brain and neck (cervical spine), such as an MRI or CT scan. These look for causes of pinched nerves. Applying pressure to the nerves in the neck to try to re-create the pain. Injection of numbing medicine into the occipital nerve areas to see if pain goes away (diagnostic nerve block). How is this  treated? Treatment for this condition may begin with simple measures, such as: Rest. Massage. Applying heat or cold to the area. Over-the-counter pain relievers. If these measures do not work, you may need other treatments, including: Medicines, such as: Prescription-strength anti-inflammatory medicines. Muscle relaxants. Anti-seizure medicines, which can relieve pain. Antidepressants, which can relieve pain. Injected medicines, such as medicines that numb the area (local anesthetic) and steroids. Pulsed radiofrequency ablation. This is when wires are implanted to deliver electrical impulses that block pain signals from the occipital nerve. Surgery to relieve nerve pressure. Physical therapy. Follow these instructions at home: Managing pain     Avoid any activities that cause pain. Rest when you have an attack of pain. Try gentle massage to relieve pain. Try a different pillow or sleeping position. If directed, apply heat to the affected area as often as told by your health care provider. Use the heat source that your health care provider recommends, such as a moist heat pack or a heating pad. Place a towel between your skin and the heat source. Leave the heat on for 20-30 minutes. Remove the heat if your skin turns bright red. This is especially important if you are unable to feel pain, heat, or cold. You have a greater risk of getting burned. If directed, put ice on the back of your head and neck area. To do this: Put ice in a plastic bag. Place a towel between your skin and the bag. Leave the ice on for 20 minutes, 2-3 times a day. Remove the ice  if your skin turns bright red. This is very important. If you cannot feel pain, heat, or cold, you have a greater risk of damage to the area. General instructions Take over-the-counter and prescription medicines only as told by your health care provider. Avoid things that make your symptoms worse, such as bright lights. Try to stay  active. Get regular exercise that does not cause pain. Ask your health care provider to suggest safe exercises for you. Work with a physical therapist to learn stretching exercises you can do at home. Practice good posture. Keep all follow-up visits. This is important. Contact a health care provider if: Your medicine is not working. You have new or worsening symptoms. Get help right away if: You have very bad head pain that does not go away. You have a sudden change in vision, balance, or speech. These symptoms may represent a serious problem that is an emergency. Do not wait to see if the symptoms will go away. Get medical help right away. Call your local emergency services (911 in the [place] Do not drive yourself to the hospital. Summary Occipital neuralgia is a type of headache that causes brief episodes of very bad pain in the back of the head. Pain from occipital neuralgia may spread (radiate) to other parts of the head. Treatment for this condition includes rest, massage, and medicines. This information is not intended to replace advice given to you by your health care provider. Make sure you discuss any questions you have with your health care provider. Document Revised: 01/08/2020 Document Reviewed: 01/09/2020

## 2023-07-17 ENCOUNTER — Other Ambulatory Visit: Payer: Self-pay | Admitting: Family Medicine

## 2023-07-17 ENCOUNTER — Ambulatory Visit (INDEPENDENT_AMBULATORY_CARE_PROVIDER_SITE_OTHER)

## 2023-07-17 DIAGNOSIS — Z780 Asymptomatic menopausal state: Secondary | ICD-10-CM | POA: Diagnosis not present

## 2023-07-17 NOTE — Telephone Encounter (Signed)
 Called patient and made appt for 07/17/23

## 2023-07-18 ENCOUNTER — Encounter: Payer: Self-pay | Admitting: Family Medicine

## 2023-07-18 DIAGNOSIS — M8589 Other specified disorders of bone density and structure, multiple sites: Secondary | ICD-10-CM | POA: Diagnosis not present

## 2023-07-18 DIAGNOSIS — Z780 Asymptomatic menopausal state: Secondary | ICD-10-CM | POA: Diagnosis not present

## 2023-07-28 ENCOUNTER — Other Ambulatory Visit: Payer: Self-pay

## 2023-07-28 MED ORDER — FERROUS SULFATE 325 (65 FE) MG PO TABS: 325.0000 mg | ORAL_TABLET | Freq: Every day | ORAL | 0 refills | Status: DC

## 2023-07-29 ENCOUNTER — Inpatient Hospital Stay: Payer: Medicare Other | Attending: Hematology

## 2023-07-29 DIAGNOSIS — R195 Other fecal abnormalities: Secondary | ICD-10-CM | POA: Diagnosis not present

## 2023-07-29 DIAGNOSIS — E538 Deficiency of other specified B group vitamins: Secondary | ICD-10-CM | POA: Diagnosis not present

## 2023-07-29 DIAGNOSIS — D500 Iron deficiency anemia secondary to blood loss (chronic): Secondary | ICD-10-CM | POA: Insufficient documentation

## 2023-07-29 LAB — COMPREHENSIVE METABOLIC PANEL WITH GFR
ALT: 16 U/L (ref 0–44)
AST: 23 U/L (ref 15–41)
Albumin: 4.1 g/dL (ref 3.5–5.0)
Alkaline Phosphatase: 38 U/L (ref 38–126)
Anion gap: 8 (ref 5–15)
BUN: 12 mg/dL (ref 8–23)
CO2: 27 mmol/L (ref 22–32)
Calcium: 9 mg/dL (ref 8.9–10.3)
Chloride: 102 mmol/L (ref 98–111)
Creatinine, Ser: 0.69 mg/dL (ref 0.44–1.00)
GFR, Estimated: >60 mL/min (ref >60)
Glucose, Bld: 99 mg/dL (ref 70–99)
Potassium: 3.6 mmol/L (ref 3.5–5.1)
Sodium: 137 mmol/L (ref 135–145)
Total Bilirubin: 0.9 mg/dL (ref 0.0–1.2)
Total Protein: 6.5 g/dL (ref 6.5–8.1)

## 2023-07-29 LAB — CBC WITH DIFFERENTIAL/PLATELET
Abs Immature Granulocytes: 0.02 10*3/uL (ref 0.00–0.07)
Basophils Absolute: 0.1 10*3/uL (ref 0.0–0.1)
Basophils Relative: 1 %
Eosinophils Absolute: 0.3 10*3/uL (ref 0.0–0.5)
Eosinophils Relative: 4 %
HCT: 40.3 % (ref 36.0–46.0)
Hemoglobin: 13.2 g/dL (ref 12.0–15.0)
Immature Granulocytes: 0 %
Lymphocytes Relative: 24 %
Lymphs Abs: 1.8 10*3/uL (ref 0.7–4.0)
MCH: 29.9 pg (ref 26.0–34.0)
MCHC: 32.8 g/dL (ref 30.0–36.0)
MCV: 91.2 fL (ref 80.0–100.0)
Monocytes Absolute: 0.8 10*3/uL (ref 0.1–1.0)
Monocytes Relative: 10 %
Neutro Abs: 4.7 10*3/uL (ref 1.7–7.7)
Neutrophils Relative %: 61 %
Platelets: 212 10*3/uL (ref 150–400)
RBC: 4.42 MIL/uL (ref 3.87–5.11)
RDW: 12.7 % (ref 11.5–15.5)
WBC: 7.7 10*3/uL (ref 4.0–10.5)
nRBC: 0 % (ref 0.0–0.2)

## 2023-07-29 LAB — IRON AND TIBC
Iron: 104 ug/dL (ref 28–170)
Saturation Ratios: 31 % (ref 10.4–31.8)
TIBC: 338 ug/dL (ref 250–450)
UIBC: 234 ug/dL

## 2023-07-29 LAB — FERRITIN: Ferritin: 161 ng/mL (ref 11–307)

## 2023-08-04 NOTE — Progress Notes (Deleted)
 [facility] [address], [place] [address]   CLINIC:  Medical Oncology/Hematology  PCP:  Eliodoro Guerin, DO [address] [place] [address] [phone]   REASON FOR VISIT:  Follow-up for iron deficiency   CURRENT THERAPY: Intermittent IV iron infusions and blood transfusions  INTERVAL HISTORY:  [name] is seen today for follow-up of her iron deficiency anemia.  She was last evaluated via telemedicine visit by Sheril Dines PA-C on 01/28/2023.  At today's visit, she reports that she is feeling fairly well.*** ***  She reports good energy.  *** She has not noticed any recurrent melena or hematochezia. ***  No pica, lightheadedness, or syncope.    ***No chest pain or difficulty breathing.    ***She is taking daily iron and B12 supplements.   ***She has 90***% energy and 100***% appetite.   ***She endorses that she is maintaining a stable weight.  ASSESSMENT & PLAN:  1.  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  No improvement despite taking iron tablet twice daily - Patient has had multiple hospitalizations and PRBC transfusions for acute blood loss anemia, followed closely by gastroenterology - S/p coil embolization of inferior pancreaticoduodenal artery from the SMA on 12/13/2020. - MOST RECENT HOSPITAL STAY (12/18/2021 - 12/20/2021): Acute blood loss anemia with extreme fatigue and dyspnea on exertion.  She was found to have Hgb 7.8, which had dropped from 11.2 in the course of 1 month.  FOBT was positive.  She received 2 units PRBC during hospitalization.  EGD (12/19/2021) showed Dieulafoy lesion in her stomach.  Hgb at the time of discharge was 9.0. - Colonoscopy (11/08/2022): Nonbleeding internal hemorrhoids, diverticulosis, polyps x 2 (tubular adenoma).  Per GI, no repeat colonoscopy due to  age. - Most recent IV Feraheme  on 10/22/2021 and 10/29/2021 - She has not had any evidence of recurrent GI bleeding since her most recent hospitalization in September 2023, but did have left breast hematoma and bleeding following biopsy on 08/22/2022 - No rectal bleeding or melena.  *** - She is taking iron tablet daily.  *** - Most recent labs (07/29/2023): Hgb 13.2/MCV 91.2, ferritin 161, iron saturation 31% - PLAN: No indication for IV iron at this time.  Continue daily iron supplement. - Labs and RTC in *** 1 year with office visit *** - Patient is aware of alarm symptoms that would prompt immediate medical attention.   2.  B12 deficiency - She is taking B12 tablet 1000 mcg daily - Most recent labs (01/21/2023): Vitamin B12 normal at 430/normal MMA - PLAN: Continue B12 supplement.  We will recheck levels annually.   PLAN SUMMARY: >> Labs in *** 1 year *** = CBC/D, CMP, ferritin, iron/TIBC >> OFFICE visit in *** 1 year ***  ** Last office visit was ***      REVIEW OF SYSTEMS: ***  Review of Systems - Oncology   PHYSICAL EXAM:  ECOG PERFORMANCE STATUS: {CHL ONC ECOG JJ:[id number]} *** There were no vitals filed for this visit. There were no vitals filed for this visit. Physical Exam  PAST MEDICAL/SURGICAL HISTORY:  Past Medical History:  Diagnosis Date   Anemia    Asymmetric septal hypertrophy    cMRI 07/2019: asymmetric hypertrophy measuring up to 25mm in basal septum (11mm in posterior wall), c/w hypertrophic CM, LVOT obstruction d/t SAM of  the MV anterior leaflet, c/w HOCM ; no identifiable gene   Blood transfusion without reported diagnosis    Cataract    removed bilateral   Chronic cystitis    Diverticulitis    GERD (gastroesophageal reflux disease)    Heart murmur    Hyperlipidemia    Hypertension    Personal history of colonic polyps-adenomas 01/07/2012   2009 - 2 diminutive adenomas (prior polyps also) 01/07/2012 - 2 diminutive adenomas     Past Surgical History:   Procedure Laterality Date   BREAST BIOPSY Right    No Scar seen    BREAST BIOPSY Left 05/10/2022   MM LT BREAST BX W LOC DEV EA AD LESION IMG BX SPEC STEREO GUIDE 05/10/2022 [facility]   BREAST BIOPSY Left 05/10/2022   MM LT BREAST BX W LOC DEV 1ST LESION IMAGE BX SPEC STEREO GUIDE 05/10/2022 [facility]   BREAST BIOPSY  08/20/2022   MM LT RADIOACTIVE SEED EA ADD LESION LOC MAMMO GUIDE 08/20/2022 [facility]   BREAST BIOPSY  08/20/2022   MM LT RADIOACTIVE SEED LOC MAMMO GUIDE 08/20/2022 [facility]   CATARACT EXTRACTION Bilateral    COLONOSCOPY  01/2020   Dr. Bridgett Camps; Five 3 to 9 mm polyps in the ascending colon, Two 4 to 5 mm polyps in the transverse colon, diverticulosis, eryethema and petechia in left colon biopsied, external and internal hemorrhoids. Pathology with tubular adenomas. Left colon biopsy was benign. Recommended 3 year surveillance.   COLONOSCOPY WITH PROPOFOL  N/A 11/08/2022   Procedure: COLONOSCOPY WITH PROPOFOL ;  Surgeon: Vinetta Greening, DO;  [facility];  Service: Endoscopy;  Laterality: N/A;  8:30 am, asa 3   ENTEROSCOPY N/A 09/26/2021   Procedure: ENTEROSCOPY;  Surgeon: Kenney Peacemaker, MD;  Location: [facility];  Service: Gastroenterology;  Laterality: N/A;   ENTEROSCOPY N/A 12/19/2021   Procedure: ENTEROSCOPY;  Surgeon: Suzette Espy, MD;  [facility]: [facility];  Service: Endoscopy;  Laterality: N/A;   ESOPHAGOGASTRODUODENOSCOPY N/A 10/07/2021   Procedure: ESOPHAGOGASTRODUODENOSCOPY (EGD);  Surgeon: Suzette Espy, MD;  [facility];  Service: Endoscopy;  Laterality: N/A;  EGD with enteroscopy  with pediatric colonoscope   ESOPHAGOGASTRODUODENOSCOPY (EGD) WITH PROPOFOL  N/A 11/23/2019   Procedure: ESOPHAGOGASTRODUODENOSCOPY (EGD) WITH PROPOFOL ;  Surgeon: Suzette Espy, MD;  [facility];  Service: Endoscopy;  Laterality: N/A;   ESOPHAGOGASTRODUODENOSCOPY (EGD) WITH PROPOFOL  N/A 10/20/2020   Procedure:  ESOPHAGOGASTRODUODENOSCOPY (EGD) WITH PROPOFOL ;  Surgeon: Vinetta Greening, DO;  [facility];  Service: Endoscopy;  Laterality: N/A;   ESOPHAGOGASTRODUODENOSCOPY (EGD) WITH PROPOFOL  N/A 12/05/2020   Procedure: ESOPHAGOGASTRODUODENOSCOPY (EGD) WITH PROPOFOL ;  Surgeon: Ruby Corporal, MD;  [facility];  Service: Endoscopy;  Laterality: N/A;  with enteroscopy   ESOPHAGOGASTRODUODENOSCOPY (EGD) WITH PROPOFOL  N/A 12/11/2020   Procedure: ESOPHAGOGASTRODUODENOSCOPY (EGD) WITH PROPOFOL ;  Surgeon: Vinetta Greening, DO;  [facility];  Service: Endoscopy;  Laterality: N/A;   ESOPHAGOGASTRODUODENOSCOPY (EGD) WITH PROPOFOL  N/A 09/17/2021   Procedure: ESOPHAGOGASTRODUODENOSCOPY (EGD) WITH PROPOFOL ;  Surgeon: Vinetta Greening, DO;  [facility];  Service: Endoscopy;  Laterality: N/A;   ESOPHAGOGASTRODUODENOSCOPY (EGD) WITH PROPOFOL  N/A 12/19/2021   Procedure: ESOPHAGOGASTRODUODENOSCOPY (EGD) WITH PROPOFOL ;  Surgeon: Suzette Espy, MD;  [facility];  Service: Endoscopy;  Laterality: N/A;  with pediatric scope   GIVENS CAPSULE STUDY N/A 11/23/2019   Procedure: GIVENS CAPSULE STUDY;  Surgeon: Suzette Espy, MD;  [facility];  Service: Endoscopy;  Laterality: N/A;   GIVENS CAPSULE STUDY N/A 10/22/2020   Procedure: GIVENS CAPSULE STUDY;  Surgeon: Vinetta Greening, DO;  [facility];  Service: Endoscopy;  Laterality: N/A;   GIVENS CAPSULE STUDY N/A 09/26/2021   Procedure: GIVENS CAPSULE STUDY;  Surgeon: Kenney Peacemaker, MD;  Location[facility];  Service: Gastroenterology;  Laterality: N/A;   HEMOSTASIS CLIP PLACEMENT  12/11/2020   Procedure: HEMOSTASIS CLIP PLACEMENT;  Surgeon: Vinetta Greening, DO;  [facility];  Service: Endoscopy;;   IR ANGIOGRAM SELECTIVE EACH ADDITIONAL VESSEL  12/05/2020   IR ANGIOGRAM SELECTIVE EACH ADDITIONAL VESSEL  12/05/2020   IR ANGIOGRAM SELECTIVE EACH ADDITIONAL VESSEL  12/05/2020    IR ANGIOGRAM SELECTIVE EACH ADDITIONAL VESSEL  12/13/2020   IR ANGIOGRAM SELECTIVE EACH ADDITIONAL VESSEL  12/13/2020   IR ANGIOGRAM VISCERAL SELECTIVE  12/05/2020   IR ANGIOGRAM VISCERAL SELECTIVE  12/13/2020   IR EMBO ART  VEN HEMORR LYMPH EXTRAV  INC GUIDE ROADMAPPING  12/05/2020   IR EMBO ART  VEN HEMORR LYMPH EXTRAV  INC GUIDE ROADMAPPING  12/13/2020   IR RADIOLOGIST EVAL & MGMT  01/11/2021   IR US  GUIDE VASC ACCESS LEFT  12/13/2020   IR US  GUIDE VASC ACCESS RIGHT  12/05/2020   POLYPECTOMY  11/08/2022   Procedure: POLYPECTOMY;  Surgeon: Vinetta Greening, DO;  [facility];  Service: Endoscopy;;   RADIOACTIVE SEED GUIDED EXCISIONAL BREAST BIOPSY Left 08/22/2022   Procedure: RADIOACTIVE SEED GUIDED BRACKETED  EXCISIONAL LEFT BREAST BIOPSY;  Surgeon: Enid Harry, MD;  Location: [facility];  Service: General;  Laterality: Left;   SUBMUCOSAL TATTOO INJECTION  09/26/2021   Procedure: SUBMUCOSAL TATTOO INJECTION;  Surgeon: Kenney Peacemaker, MD;  Location: [facility];  Service: Gastroenterology;;    SOCIAL HISTORY:  Social History   Socioeconomic History   Marital status: Married    Spouse name: Not on file   Number of children: 1   Years of education: Not on file   Highest education level: Not on file  Occupational History   Occupation: retired  Tobacco Use   Smoking status: Former    Current packs/day: 0.00    Types: Cigarettes    Quit date: 12/23/1984    Years since quitting: 38.6   Smokeless tobacco: Never  Vaping Use   Vaping status: Never Used  Substance and Sexual Activity   Alcohol use: Yes    Comment: wine occasionally, maybe twice a month   Drug use: No   Sexual activity: Not on file  Other Topics Concern   Not on file  Social History Narrative   Patient is married and retired and has 1 grown child   Social Drivers of Corporate investment banker Strain: Low Risk  (04/02/2023)   Overall Financial Resource Strain (CARDIA)    Difficulty of Paying  Living Expenses: Not hard at all  Food Insecurity: No Food Insecurity (04/02/2023)   Hunger Vital Sign    Worried About Running Out of Food in the Last Year: Never true    Ran Out of Food in the Last Year: Never true  Transportation Needs: No Transportation Needs (04/02/2023)   PRAPARE - Administrator, Civil Service (Medical): No    Lack of Transportation (Non-Medical): No  Physical Activity: Insufficiently Active (04/02/2023)   Exercise Vital Sign    Days of Exercise per Week: 3 days    Minutes of Exercise per Session: 30 min  Stress: No Stress Concern Present (04/02/2023)  [employer] of Occupational Health - Occupational Stress Questionnaire    Feeling of Stress : Not at all  Social Connections: Moderately Integrated (04/02/2023)   Social Connection and Isolation Panel [NHANES]    Frequency of Communication with Friends and Family: More than three times a week    Frequency of Social Gatherings with Friends and Family: Three times a week    Attends Religious Services: 1 to 4 times per year    Active Member of Clubs or Organizations: No    Attends Banker Meetings: Never    Marital Status: Married  Catering manager Violence: Not At Risk (04/02/2023)   Humiliation, Afraid, Rape, and Kick questionnaire    Fear of Current or Ex-Partner: No    Emotionally Abused: No    Physically Abused: No    Sexually Abused: No    FAMILY HISTORY:  Family History  Problem Relation Age of Onset   Colon cancer Mother 26       40's   Breast cancer Sister    Asthma Brother    Colon polyps Neg Hx    Esophageal cancer Neg Hx    Rectal cancer Neg Hx    Stomach cancer Neg Hx     CURRENT MEDICATIONS:  Outpatient Encounter Medications as of 08/05/2023  Medication Sig   acetaminophen  (TYLENOL ) 325 MG tablet Take 325 mg by mouth every 6 (six) hours as needed for moderate pain.   Ascorbic Acid (VITAMIN C PO) Take 1 tablet by mouth daily.   bisoprolol  (ZEBETA ) 5 MG tablet TAKE  ONE-HALF TABLET BY MOUTH  DAILY   CALCIUM  PO Take 1 tablet by mouth daily.   Cholecalciferol (VITAMIN D -3 PO) Take 1 capsule by mouth daily.   COPPER  PO Take 1 capsule by mouth daily.   cyanocobalamin  (CYANOCOBALAMIN ) 500 MCG tablet Take 500 mcg by mouth daily.   ferrous sulfate  325 (65 FE) MG tablet Take 1 tablet (325 mg total) by mouth daily.   fluticasone  (FLONASE ) 50 MCG/ACT nasal spray SPRAY 2 SPRAYS INTO EACH NOSTRIL EVERY DAY   gabapentin  (NEURONTIN ) 300 MG capsule Take 1 capsule (300 mg total) by mouth at bedtime.   levocetirizine (XYZAL ) 5 MG tablet Take 1 tablet (5 mg total) by mouth every evening.   MAGNESIUM  PO Take 1 tablet by mouth daily.   meloxicam  (MOBIC ) 15 MG tablet Take 0.5-1 tablets (7.5-15 mg total) by mouth daily as needed for pain (use sparingly. can increase GI bleeding.).   Na Sulfate-K Sulfate-Mg Sulf 17.5-3.13-1.6 GM/177ML SOLN As directed   pantoprazole  (PROTONIX ) 40 MG tablet TAKE 1 TABLET BY MOUTH TWICE  DAILY   promethazine -dextromethorphan (PROMETHAZINE -DM) 6.25-15 MG/5ML syrup Take 5 mLs by mouth 4 (four) times daily as needed.   rosuvastatin  (CRESTOR ) 10 MG tablet Take 1 tablet (10 mg total) by mouth at bedtime.   Vitamin D , Ergocalciferol , (DRISDOL ) 1.25 MG (50000 UNIT) CAPS capsule TAKE 1 CAPSULE BY MOUTH ONCE  WEEKLY   VITAMIN E  PO Take 1 tablet by mouth daily.   No facility-administered encounter medications on file as of 08/05/2023.    ALLERGIES:  Allergies  Allergen Reactions   Ciprofloxacin     Penicillin G Hives   Sulfa Antibiotics Rash    LABORATORY DATA:  I have reviewed the labs as listed.  CBC    Component Value Date/Time   WBC 7.7 07/29/2023 1253   RBC 4.42 07/29/2023 1253   HGB 13.2 07/29/2023 1253   HGB 13.2 05/07/2023 1423   HCT 40.3 07/29/2023 1253  HCT 40.3 05/07/2023 1423   PLT 212 07/29/2023 1253   PLT 210 05/07/2023 1423   MCV 91.2 07/29/2023 1253   MCV 92 05/07/2023 1423   MCH 29.9 07/29/2023 1253   MCHC 32.8  07/29/2023 1253   RDW 12.7 07/29/2023 1253   RDW 12.9 05/07/2023 1423   LYMPHSABS 1.8 07/29/2023 1253   LYMPHSABS 2.1 05/21/2022 1603   MONOABS 0.8 07/29/2023 1253   EOSABS 0.3 07/29/2023 1253   EOSABS 0.4 05/21/2022 1603   BASOSABS 0.1 07/29/2023 1253   BASOSABS 0.1 05/21/2022 1603      Latest Ref Rng & Units 07/29/2023   12:53 PM 01/21/2023   11:44 AM 12/02/2022   12:00 PM  CMP  Glucose 70 - 99 mg/dL 99  161  096   BUN 8 - 23 mg/dL 12  10  12    Creatinine 0.44 - 1.00 mg/dL 0.45  4.09  8.11   Sodium 135 - 145 mmol/L 137  136  139   Potassium 3.5 - 5.1 mmol/L 3.6  3.8  4.3   Chloride 98 - 111 mmol/L 102  100  103   CO2 22 - 32 mmol/L 27  29  24    Calcium  8.9 - 10.3 mg/dL 9.0  9.2  9.0   Total Protein 6.5 - 8.1 g/dL 6.5  7.0    Total Bilirubin 0.0 - 1.2 mg/dL 0.9  0.7    Alkaline Phos 38 - 126 U/L 38  43    AST 15 - 41 U/L 23  21    ALT 0 - 44 U/L 16  18      DIAGNOSTIC IMAGING:  I have independently reviewed the relevant imaging and discussed with the patient.   WRAP UP:  All questions were answered. The patient knows to call the clinic with any problems, questions or concerns.  Medical decision making: ***  Time spent on visit: I spent *** minutes counseling the patient face to face. The total time spent in the appointment was *** minutes and more than 50% was on counseling.  Sonnie Dusky, PA-C  ***

## 2023-08-05 ENCOUNTER — Inpatient Hospital Stay: Payer: Medicare Other | Admitting: Physician Assistant

## 2023-08-05 NOTE — Progress Notes (Unsigned)
 [facility] [address] [place], [place] [address]   CLINIC:  Medical Oncology/Hematology  PCP:  Eliodoro Guerin, DO [address] [place] [address] [phone]   REASON FOR VISIT:  Follow-up for iron deficiency   CURRENT THERAPY: Intermittent IV iron infusions and blood transfusions  INTERVAL HISTORY:  Stacey Reyes is seen today for follow-up of her iron deficiency anemia.  She was last evaluated via telemedicine visit by Sheril Dines PA-C on 01/28/2023.  At today's visit, she reports that she is feeling fairly well.*** ***  She reports good energy.  *** She has not noticed any recurrent melena or hematochezia. ***  No pica, lightheadedness, or syncope.    ***No chest pain or difficulty breathing.    ***She is taking daily iron and B12 supplements.   ***She has 90***% energy and 100***% appetite.   ***She endorses that she is maintaining a stable weight.  ASSESSMENT & PLAN:  1.  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  No improvement despite taking iron tablet twice daily - Patient has had multiple hospitalizations and PRBC transfusions for acute blood loss anemia, followed closely by gastroenterology - S/p coil embolization of inferior pancreaticoduodenal artery from the SMA on 12/13/2020. - MOST RECENT HOSPITAL STAY (12/18/2021 - 12/20/2021): Acute blood loss anemia with extreme fatigue and dyspnea on exertion.  She was found to have Hgb 7.8, which had dropped from 11.2 in the course of 1 month.  FOBT was positive.  She received 2 units PRBC during hospitalization.  EGD (12/19/2021) showed Dieulafoy lesion in her stomach.  Hgb at the time of discharge was 9.0. - Colonoscopy (11/08/2022): Nonbleeding internal hemorrhoids, diverticulosis, polyps x 2 (tubular adenoma).  Per GI, no repeat colonoscopy due to  age. - Most recent IV Feraheme  on 10/22/2021 and 10/29/2021 - She has not had any evidence of recurrent GI bleeding since her most recent hospitalization in September 2023, but did have left breast hematoma and bleeding following biopsy on 08/22/2022 - No rectal bleeding or melena.  *** - She is taking iron tablet daily.  *** - Most recent labs (07/29/2023): Hgb 13.2/MCV 91.2, ferritin 161, iron saturation 31% - PLAN: No indication for IV iron at this time.  Continue daily iron supplement. - Labs and RTC in *** 1 year with office visit *** - Patient is aware of alarm symptoms that would prompt immediate medical attention.   2.  B12 deficiency - She is taking B12 tablet 1000 mcg daily - Most recent labs (01/21/2023): Vitamin B12 normal at 430/normal MMA - PLAN: Continue B12 supplement.  We will recheck levels annually.   PLAN SUMMARY: >> Labs in *** 1 year *** = CBC/D, CMP, ferritin, iron/TIBC >> OFFICE visit in *** 1 year ***  ** Last office visit was ***      REVIEW OF SYSTEMS: ***  Review of Systems - Oncology   PHYSICAL EXAM:  ECOG PERFORMANCE STATUS: {CHL ONC ECOG ZD:[id number]} *** There were no vitals filed for this visit. There were no vitals filed for this visit. Physical Exam  PAST MEDICAL/SURGICAL HISTORY:  Past Medical History:  Diagnosis Date   Anemia    Asymmetric septal hypertrophy    cMRI 07/2019: asymmetric hypertrophy measuring up to 25mm in basal septum (11mm in posterior wall), c/w hypertrophic CM, LVOT obstruction d/t SAM of  the MV anterior leaflet, c/w HOCM ; no identifiable gene   Blood transfusion without reported diagnosis    Cataract    removed bilateral   Chronic cystitis    Diverticulitis    GERD (gastroesophageal reflux disease)    Heart murmur    Hyperlipidemia    Hypertension    Personal history of colonic polyps-adenomas 01/07/2012   2009 - 2 diminutive adenomas (prior polyps also) 01/07/2012 - 2 diminutive adenomas     Past Surgical History:   Procedure Laterality Date   BREAST BIOPSY Right    No Scar seen    BREAST BIOPSY Left 05/10/2022   MM LT BREAST BX W LOC DEV EA AD LESION IMG BX SPEC STEREO GUIDE 05/10/2022 [facility]   BREAST BIOPSY Left 05/10/2022   MM LT BREAST BX W LOC DEV 1ST LESION IMAGE BX SPEC STEREO GUIDE 05/10/2022 [facility]   BREAST BIOPSY  08/20/2022   MM LT RADIOACTIVE SEED EA ADD LESION LOC MAMMO GUIDE 08/20/2022 [facility]   BREAST BIOPSY  08/20/2022   MM LT RADIOACTIVE SEED LOC MAMMO GUIDE 08/20/2022 [facility]   CATARACT EXTRACTION Bilateral    COLONOSCOPY  01/2020   Dr. Bridgett Camps; Five 3 to 9 mm polyps in the ascending colon, Two 4 to 5 mm polyps in the transverse colon, diverticulosis, eryethema and petechia in left colon biopsied, external and internal hemorrhoids. Pathology with tubular adenomas. Left colon biopsy was benign. Recommended 3 year surveillance.   COLONOSCOPY WITH PROPOFOL  N/A 11/08/2022   Procedure: COLONOSCOPY WITH PROPOFOL ;  Surgeon: Vinetta Greening, DO;  [facility];  Service: Endoscopy;  Laterality: N/A;  8:30 am, asa 3   ENTEROSCOPY N/A 09/26/2021   Procedure: ENTEROSCOPY;  Surgeon: Kenney Peacemaker, MD;  Location: [facility];  Service: Gastroenterology;  Laterality: N/A;   ENTEROSCOPY N/A 12/19/2021   Procedure: ENTEROSCOPY;  Surgeon: Suzette Espy, MD;  [facility];  Service: Endoscopy;  Laterality: N/A;   ESOPHAGOGASTRODUODENOSCOPY N/A 10/07/2021   Procedure: ESOPHAGOGASTRODUODENOSCOPY (EGD);  Surgeon: Suzette Espy, MD;  [facility];  Service: Endoscopy;  Laterality: N/A;  EGD with enteroscopy  with pediatric colonoscope   ESOPHAGOGASTRODUODENOSCOPY (EGD) WITH PROPOFOL  N/A 11/23/2019   Procedure: ESOPHAGOGASTRODUODENOSCOPY (EGD) WITH PROPOFOL ;  Surgeon: Suzette Espy, MD;  [facility];  Service: Endoscopy;  Laterality: N/A;   ESOPHAGOGASTRODUODENOSCOPY (EGD) WITH PROPOFOL  N/A 10/20/2020   Procedure:  ESOPHAGOGASTRODUODENOSCOPY (EGD) WITH PROPOFOL ;  Surgeon: Vinetta Greening, DO;  [facility];  Service: Endoscopy;  Laterality: N/A;   ESOPHAGOGASTRODUODENOSCOPY (EGD) WITH PROPOFOL  N/A 12/05/2020   Procedure: ESOPHAGOGASTRODUODENOSCOPY (EGD) WITH PROPOFOL ;  Surgeon: Ruby Corporal, MD;  [facility];  Service: Endoscopy;  Laterality: N/A;  with enteroscopy   ESOPHAGOGASTRODUODENOSCOPY (EGD) WITH PROPOFOL  N/A 12/11/2020   Procedure: ESOPHAGOGASTRODUODENOSCOPY (EGD) WITH PROPOFOL ;  Surgeon: Vinetta Greening, DO;  [facility];  Service: Endoscopy;  Laterality: N/A;   ESOPHAGOGASTRODUODENOSCOPY (EGD) WITH PROPOFOL  N/A 09/17/2021   Procedure: ESOPHAGOGASTRODUODENOSCOPY (EGD) WITH PROPOFOL ;  Surgeon: Vinetta Greening, DO;  [facility];  Service: Endoscopy;  Laterality: N/A;   ESOPHAGOGASTRODUODENOSCOPY (EGD) WITH PROPOFOL  N/A 12/19/2021   Procedure: ESOPHAGOGASTRODUODENOSCOPY (EGD) WITH PROPOFOL ;  Surgeon: Suzette Espy, MD;  [facility];  Service: Endoscopy;  Laterality: N/A;  with pediatric scope   GIVENS CAPSULE STUDY N/A 11/23/2019   Procedure: GIVENS CAPSULE STUDY;  Surgeon: Suzette Espy, MD;  [facility];  Service: Endoscopy;  Laterality: N/A;   GIVENS CAPSULE STUDY N/A 10/22/2020   Procedure: GIVENS CAPSULE STUDY;  Surgeon: Vinetta Greening, DO;  [facility];  Service: Endoscopy;  Laterality: N/A;   GIVENS CAPSULE STUDY N/A 09/26/2021   Procedure: GIVENS CAPSULE STUDY;  Surgeon: Kenney Peacemaker, MD;  [facility];  Service: Gastroenterology;  Laterality: N/A;   HEMOSTASIS CLIP PLACEMENT  12/11/2020   Procedure: HEMOSTASIS CLIP PLACEMENT;  Surgeon: Vinetta Greening, DO;  [facility];  Service: Endoscopy;;   IR ANGIOGRAM SELECTIVE EACH ADDITIONAL VESSEL  12/05/2020   IR ANGIOGRAM SELECTIVE EACH ADDITIONAL VESSEL  12/05/2020   IR ANGIOGRAM SELECTIVE EACH ADDITIONAL VESSEL  12/05/2020    IR ANGIOGRAM SELECTIVE EACH ADDITIONAL VESSEL  12/13/2020   IR ANGIOGRAM SELECTIVE EACH ADDITIONAL VESSEL  12/13/2020   IR ANGIOGRAM VISCERAL SELECTIVE  12/05/2020   IR ANGIOGRAM VISCERAL SELECTIVE  12/13/2020   IR EMBO ART  VEN HEMORR LYMPH EXTRAV  INC GUIDE ROADMAPPING  12/05/2020   IR EMBO ART  VEN HEMORR LYMPH EXTRAV  INC GUIDE ROADMAPPING  12/13/2020   IR RADIOLOGIST EVAL & MGMT  01/11/2021   IR US  GUIDE VASC ACCESS LEFT  12/13/2020   IR US  GUIDE VASC ACCESS RIGHT  12/05/2020   POLYPECTOMY  11/08/2022   Procedure: POLYPECTOMY;  Surgeon: Vinetta Greening, DO;  [facility];  Service: Endoscopy;;   RADIOACTIVE SEED GUIDED EXCISIONAL BREAST BIOPSY Left 08/22/2022   Procedure: RADIOACTIVE SEED GUIDED BRACKETED  EXCISIONAL LEFT BREAST BIOPSY;  Surgeon: Enid Harry, MD;  Location[facility];  Service: General;  Laterality: Left;   SUBMUCOSAL TATTOO INJECTION  09/26/2021   Procedure: SUBMUCOSAL TATTOO INJECTION;  Surgeon: Kenney Peacemaker, MD;  Location: [facility] ENDOSCOPY;  Service: Gastroenterology;;    SOCIAL HISTORY:  Social History   Socioeconomic History   Marital status: Married    Spouse name: Not on file   Number of children: 1   Years of education: Not on file   Highest education level: Not on file  Occupational History   Occupation: retired  Tobacco Use   Smoking status: Former    Current packs/day: 0.00    Types: Cigarettes    Quit date: 12/23/1984    Years since quitting: 38.6   Smokeless tobacco: Never  Vaping Use   Vaping status: Never Used  Substance and Sexual Activity   Alcohol use: Yes    Comment: wine occasionally, maybe twice a month   Drug use: No   Sexual activity: Not on file  Other Topics Concern   Not on file  Social History Narrative   Patient is married and retired and has 1 grown child   Social Drivers of Corporate investment banker Strain: Low Risk  (04/02/2023)   Overall Financial Resource Strain (CARDIA)    Difficulty of Paying  Living Expenses: Not hard at all  Food Insecurity: No Food Insecurity (04/02/2023)   Hunger Vital Sign    Worried About Running Out of Food in the Last Year: Never true    Ran Out of Food in the Last Year: Never true  Transportation Needs: No Transportation Needs (04/02/2023)   PRAPARE - Administrator, Civil Service (Medical): No    Lack of Transportation (Non-Medical): No  Physical Activity: Insufficiently Active (04/02/2023)   Exercise Vital Sign    Days of Exercise per Week: 3 days    Minutes of Exercise per Session: 30 min  Stress: No Stress Concern Present (04/02/2023)  [employer] of Occupational Health - Occupational Stress Questionnaire    Feeling of Stress : Not at all  Social Connections: Moderately Integrated (04/02/2023)   Social Connection and Isolation Panel [NHANES]    Frequency of Communication with Friends and Family: More than three times a week    Frequency of Social Gatherings with Friends and Family: Three times a week    Attends Religious Services: 1 to 4 times per year    Active Member of Clubs or Organizations: No    Attends Banker Meetings: Never    Marital Status: Married  Catering manager Violence: Not At Risk (04/02/2023)   Humiliation, Afraid, Rape, and Kick questionnaire    Fear of Current or Ex-Partner: No    Emotionally Abused: No    Physically Abused: No    Sexually Abused: No    FAMILY HISTORY:  Family History  Problem Relation Age of Onset   Colon cancer Mother 54       50's   Breast cancer Sister    Asthma Brother    Colon polyps Neg Hx    Esophageal cancer Neg Hx    Rectal cancer Neg Hx    Stomach cancer Neg Hx     CURRENT MEDICATIONS:  Outpatient Encounter Medications as of 08/07/2023  Medication Sig   acetaminophen  (TYLENOL ) 325 MG tablet Take 325 mg by mouth every 6 (six) hours as needed for moderate pain.   Ascorbic Acid (VITAMIN C PO) Take 1 tablet by mouth daily.   bisoprolol  (ZEBETA ) 5 MG tablet TAKE  ONE-HALF TABLET BY MOUTH  DAILY   CALCIUM  PO Take 1 tablet by mouth daily.   Cholecalciferol (VITAMIN D -3 PO) Take 1 capsule by mouth daily.   COPPER  PO Take 1 capsule by mouth daily.   cyanocobalamin  (CYANOCOBALAMIN ) 500 MCG tablet Take 500 mcg by mouth daily.   ferrous sulfate  325 (65 FE) MG tablet Take 1 tablet (325 mg total) by mouth daily.   fluticasone  (FLONASE ) 50 MCG/ACT nasal spray SPRAY 2 SPRAYS INTO EACH NOSTRIL EVERY DAY   gabapentin  (NEURONTIN ) 300 MG capsule Take 1 capsule (300 mg total) by mouth at bedtime.   levocetirizine (XYZAL ) 5 MG tablet Take 1 tablet (5 mg total) by mouth every evening.   MAGNESIUM  PO Take 1 tablet by mouth daily.   meloxicam  (MOBIC ) 15 MG tablet Take 0.5-1 tablets (7.5-15 mg total) by mouth daily as needed for pain (use sparingly. can increase GI bleeding.).   Na Sulfate-K Sulfate-Mg Sulf 17.5-3.13-1.6 GM/177ML SOLN As directed   pantoprazole  (PROTONIX ) 40 MG tablet TAKE 1 TABLET BY MOUTH TWICE  DAILY   promethazine -dextromethorphan (PROMETHAZINE -DM) 6.25-15 MG/5ML syrup Take 5 mLs by mouth 4 (four) times daily as needed.   rosuvastatin  (CRESTOR ) 10 MG tablet Take 1 tablet (10 mg total) by mouth at bedtime.   Vitamin D , Ergocalciferol , (DRISDOL ) 1.25 MG (50000 UNIT) CAPS capsule TAKE 1 CAPSULE BY MOUTH ONCE  WEEKLY   VITAMIN E  PO Take 1 tablet by mouth daily.   No facility-administered encounter medications on file as of 08/07/2023.    ALLERGIES:  Allergies  Allergen Reactions   Ciprofloxacin     Penicillin G Hives   Sulfa Antibiotics Rash    LABORATORY DATA:  I have reviewed the labs as listed.  CBC    Component Value Date/Time   WBC 7.7 07/29/2023 1253   RBC 4.42 07/29/2023 1253   HGB 13.2 07/29/2023 1253   HGB 13.2 05/07/2023 1423   HCT 40.3 07/29/2023 1253  HCT 40.3 05/07/2023 1423   PLT 212 07/29/2023 1253   PLT 210 05/07/2023 1423   MCV 91.2 07/29/2023 1253   MCV 92 05/07/2023 1423   MCH 29.9 07/29/2023 1253   MCHC 32.8  07/29/2023 1253   RDW 12.7 07/29/2023 1253   RDW 12.9 05/07/2023 1423   LYMPHSABS 1.8 07/29/2023 1253   LYMPHSABS 2.1 05/21/2022 1603   MONOABS 0.8 07/29/2023 1253   EOSABS 0.3 07/29/2023 1253   EOSABS 0.4 05/21/2022 1603   BASOSABS 0.1 07/29/2023 1253   BASOSABS 0.1 05/21/2022 1603      Latest Ref Rng & Units 07/29/2023   12:53 PM 01/21/2023   11:44 AM 12/02/2022   12:00 PM  CMP  Glucose 70 - 99 mg/dL 99  409  811   BUN 8 - 23 mg/dL 12  10  12    Creatinine 0.44 - 1.00 mg/dL 9.14  7.82  9.56   Sodium 135 - 145 mmol/L 137  136  139   Potassium 3.5 - 5.1 mmol/L 3.6  3.8  4.3   Chloride 98 - 111 mmol/L 102  100  103   CO2 22 - 32 mmol/L 27  29  24    Calcium  8.9 - 10.3 mg/dL 9.0  9.2  9.0   Total Protein 6.5 - 8.1 g/dL 6.5  7.0    Total Bilirubin 0.0 - 1.2 mg/dL 0.9  0.7    Alkaline Phos 38 - 126 U/L 38  43    AST 15 - 41 U/L 23  21    ALT 0 - 44 U/L 16  18      DIAGNOSTIC IMAGING:  I have independently reviewed the relevant imaging and discussed with the patient.   WRAP UP:  All questions were answered. The patient knows to call the clinic with any problems, questions or concerns.  Medical decision making: ***  Time spent on visit: I spent *** minutes counseling the patient face to face. The total time spent in the appointment was *** minutes and more than 50% was on counseling.  Sonnie Dusky, PA-C  ***

## 2023-08-07 ENCOUNTER — Inpatient Hospital Stay: Admitting: Physician Assistant

## 2023-08-07 VITALS — BP 122/71 | HR 62 | Temp 99.3°F | Resp 18 | Wt 188.9 lb

## 2023-08-07 DIAGNOSIS — E538 Deficiency of other specified B group vitamins: Secondary | ICD-10-CM

## 2023-08-07 DIAGNOSIS — D500 Iron deficiency anemia secondary to blood loss (chronic): Secondary | ICD-10-CM

## 2023-08-07 DIAGNOSIS — R195 Other fecal abnormalities: Secondary | ICD-10-CM | POA: Diagnosis not present

## 2023-08-07 NOTE — Patient Instructions (Addendum)
 [facility] at [facility] **VISIT SUMMARY & IMPORTANT INSTRUCTIONS **   You were seen today by Sheril Dines PA-C for your iron deficiency anemia.    IRON DEFICIENCY ANEMIA: Your labs show excellent iron and blood levels! Since you have not required any IV iron since August 2023, and have not had any symptoms of active bleeding, we will discharge you to your primary care provider. Please reach out if you feel that you need to see us  again, as we would be happy to care for you on an as-needed basis!  MEDICATIONS: - CONTINUE iron supplement (ferrous sulfate  325 mg) daily - CONTINUE vitamin B12 supplement (1000 mcg) daily   ** Thank you for trusting me with your healthcare!  I strive to provide all of my patients with quality care at each visit.  If you receive a survey for this visit, I would be so grateful to you for taking the time to provide feedback.  Thank you in advance!  ~ Janyla Biscoe                   Dr. Paulett Boros   &   Sheril Dines, PA-C   - - - - - - - - - - - - - - - - - -    Thank you for choosing [facility] at [facility] to provide your oncology and hematology care.  To afford each patient quality time with our provider, please arrive at least 15 minutes before your scheduled appointment time.   If you have a lab appointment with the Cancer Center please come in thru the Main Entrance and check in at the main information desk.  You need to re-schedule your appointment should you arrive 10 or more minutes late.  We strive to give you quality time with our providers, and arriving late affects you and other patients whose appointments are after yours.  Also, if you no show three or more times for appointments you may be dismissed from the clinic at the providers discretion.     Again, thank you for choosing [facility].  Our hope is that these requests will decrease the amount of time that you wait before  being seen by our physicians.       _____________________________________________________________  Should you have questions after your visit to [facility], please contact our office at [phone] and follow the prompts.  Our office hours are 8:00 a.m. and 4:30 p.m. Monday - Friday.  Please note that voicemails left after 4:00 p.m. may not be returned until the following business day.  We are closed weekends and major holidays.  You do have access to a nurse 24-7, just call the main number to the clinic [phone] and do not press any options, hold on the line and a nurse will answer the phone.    For prescription refill requests, have your pharmacy contact our office and allow 72 hours.

## 2023-08-15 ENCOUNTER — Other Ambulatory Visit: Payer: Self-pay | Admitting: Cardiology

## 2023-08-15 ENCOUNTER — Other Ambulatory Visit: Payer: Self-pay | Admitting: Family Medicine

## 2023-08-15 ENCOUNTER — Other Ambulatory Visit: Payer: Self-pay | Admitting: Hematology

## 2023-08-19 ENCOUNTER — Encounter: Payer: Self-pay | Admitting: Hematology

## 2023-08-26 ENCOUNTER — Other Ambulatory Visit: Payer: Self-pay | Admitting: Family Medicine

## 2023-08-26 DIAGNOSIS — M79641 Pain in right hand: Secondary | ICD-10-CM

## 2023-08-29 ENCOUNTER — Other Ambulatory Visit: Payer: Self-pay | Admitting: Cardiology

## 2023-09-07 ENCOUNTER — Other Ambulatory Visit: Payer: Self-pay | Admitting: Cardiology

## 2023-09-26 ENCOUNTER — Other Ambulatory Visit: Payer: Self-pay | Admitting: Cardiology

## 2023-10-23 ENCOUNTER — Other Ambulatory Visit: Payer: Self-pay | Admitting: Family Medicine

## 2023-10-30 ENCOUNTER — Other Ambulatory Visit: Payer: Self-pay | Admitting: Family Medicine

## 2023-11-04 ENCOUNTER — Other Ambulatory Visit: Payer: Self-pay | Admitting: *Deleted

## 2023-11-17 ENCOUNTER — Ambulatory Visit: Admitting: Family Medicine

## 2023-11-17 ENCOUNTER — Encounter: Payer: Self-pay | Admitting: Family Medicine

## 2023-11-17 VITALS — BP 120/65 | HR 75 | Temp 97.8°F | Ht 65.0 in | Wt 188.2 lb

## 2023-11-17 DIAGNOSIS — L249 Irritant contact dermatitis, unspecified cause: Secondary | ICD-10-CM

## 2023-11-17 MED ORDER — BETAMETHASONE SOD PHOS & ACET 6 (3-3) MG/ML IJ SUSP
6.0000 mg | Freq: Once | INTRAMUSCULAR | Status: AC
  Administered 2023-11-17: 6 mg via INTRAMUSCULAR

## 2023-11-17 MED ORDER — TRIAMCINOLONE ACETONIDE 0.1 % EX CREA
1.0000 | TOPICAL_CREAM | Freq: Three times a day (TID) | CUTANEOUS | 0 refills | Status: AC
Start: 2023-11-17 — End: ?

## 2023-11-17 NOTE — Progress Notes (Signed)
 Subjective:  Patient ID: Stacey Reyes, female    DOB: 1945-10-29  Age: 78 y.o. MRN: [id number]  CC: Bumps on legs and ankles (Very itchy)   HPI  Discussed the use of AI scribe software for clinical note transcription with the patient, who gave verbal consent to proceed.  History of Present Illness Stacey Reyes is a 78 year old female who presents with itchy bumps on her legs and ankles.  She has experienced itchy bumps on her legs and ankles, beginning with a single bump approximately one week ago. The bumps have since spread, primarily affecting the area below her knees, with a new bump appearing daily. The itching is severe, particularly at night, and scratching the bumps causes pain.  She has attempted to alleviate the itching with Benadryl  and other unspecified treatments, but these have not provided relief. No recent changes in outdoor activities or exposure to new environmental factors that could explain the rash.  She has a longstanding heart murmur, which has been evaluated and is described as 'loud' but stable.          11/17/2023    1:50 PM 07/16/2023   11:22 AM 05/08/2023    9:07 AM  Depression screen PHQ 2/9  Decreased Interest 0 0 0  Down, Depressed, Hopeless 0 0 1  PHQ - 2 Score 0 0 1  Altered sleeping 2 3 1   Tired, decreased energy 0 0 1  Change in appetite 0 0 1  Feeling bad or failure about yourself  0 0 1  Trouble concentrating 0 0 0  Moving slowly or fidgety/restless 0 0 1  Suicidal thoughts  0 0  PHQ-9 Score 2 3 6   Difficult doing work/chores Not difficult at all Somewhat difficult Somewhat difficult    History Stacey Reyes has a past medical history of Anemia, Asymmetric septal hypertrophy, Blood transfusion without reported diagnosis, Cataract, Chronic cystitis, Diverticulitis, GERD (gastroesophageal reflux disease), Heart murmur, Hyperlipidemia, Hypertension, and Personal history of colonic polyps-adenomas (01/07/2012).   She has a past  surgical history that includes Colonoscopy (01/2020); Breast biopsy (Right); Esophagogastroduodenoscopy (egd) with propofol  (N/A, 11/23/2019); Givens capsule study (N/A, 11/23/2019); Givens capsule study (N/A, 10/22/2020); Esophagogastroduodenoscopy (egd) with propofol  (N/A, 10/20/2020); Esophagogastroduodenoscopy (egd) with propofol  (N/A, 12/05/2020); IR Angiogram Visceral Selective (12/05/2020); IR EMBO ART  VEN HEMORR LYMPH EXTRAV  INC GUIDE ROADMAPPING (12/05/2020); IR US  Guide Vasc Access Right (12/05/2020); IR Angiogram Selective Each Additional Vessel (12/05/2020); IR Angiogram Selective Each Additional Vessel (12/05/2020); IR Angiogram Selective Each Additional Vessel (12/05/2020); Esophagogastroduodenoscopy (egd) with propofol  (N/A, 12/11/2020); Hemostasis clip placement (12/11/2020); IR Angiogram Visceral Selective (12/13/2020); IR US  Guide Vasc Access Left (12/13/2020); IR Angiogram Selective Each Additional Vessel (12/13/2020); IR Angiogram Selective Each Additional Vessel (12/13/2020); IR EMBO ART  VEN HEMORR LYMPH EXTRAV  INC GUIDE ROADMAPPING (12/13/2020); IR Radiologist Eval & Mgmt (01/11/2021); Esophagogastroduodenoscopy (egd) with propofol  (N/A, 09/17/2021); enteroscopy (N/A, 09/26/2021); Submucosal tattoo injection (09/26/2021); Givens capsule study (N/A, 09/26/2021); Esophagogastroduodenoscopy (N/A, 10/07/2021); Esophagogastroduodenoscopy (egd) with propofol  (N/A, 12/19/2021); enteroscopy (N/A, 12/19/2021); Breast biopsy (Left, 05/10/2022); Breast biopsy (Left, 05/10/2022); Cataract extraction (Bilateral); Breast biopsy (08/20/2022); Breast biopsy (08/20/2022); Radioactive seed guided excisional breast biopsy (Left, 08/22/2022); Colonoscopy with propofol  (N/A, 11/08/2022); and polypectomy (11/08/2022).   Her family history includes Asthma in her brother; Breast cancer in her sister; Colon cancer (age of onset: 43) in her mother.She reports that she quit smoking about 38 years ago. Her smoking use  included cigarettes. She has never used smokeless tobacco. She reports current alcohol  use. She reports that she does not use drugs.    ROS Review of Systems  Objective:  BP 120/65   Pulse 75   Temp 97.8 F (36.6 C)   Ht 5' 5 (1.651 m)   Wt 188 lb 3.2 oz (85.4 kg)   SpO2 96%   BMI 31.32 kg/m   BP Readings from Last 3 Encounters:  11/17/23 120/65  08/07/23 122/71  07/16/23 111/66    Wt Readings from Last 3 Encounters:  11/17/23 188 lb 3.2 oz (85.4 kg)  08/07/23 188 lb 15 oz (85.7 kg)  07/16/23 189 lb 9.6 oz (86 kg)     Physical Exam  Physical Exam GENERAL: Alert, cooperative, well developed, no acute distress. HEENT: Normocephalic, normal oropharynx, moist mucous membranes. CHEST: Clear to auscultation bilaterally, no wheezes, rhonchi, or crackles. CARDIOVASCULAR: Normal heart rate and rhythm, S1 and S2 normal without murmurs. SKIN: Pruritic papules on legs and ankles. Scattered, red, excoriated   Assessment & Plan:  Irritant contact dermatitis, unspecified trigger -     Betamethasone  Sod Phos & Acet    Assessment and Plan Assessment & Plan Pruritic rash on lower legs and ankles   A pruritic rash on the lower legs and ankles has been present for over a week, starting as a single bump with new lesions appearing daily. The rash is intensely itchy, especially at night. There is no reported exposure to unusual environmental factors. The differential diagnosis includes an allergic reaction, possibly to environmental allergens. A heart murmur is noted but not contributing to current symptoms. There are no signs of a systemic allergic reaction such as wheezing or palpitations. The rash is spreading, requiring intervention to prevent further dissemination. Administer Celestone  1 mL IM injection to reduce inflammation and halt the rash's spread. Prescribe a topical cream for direct application to affected areas to alleviate pruritus and promote healing.  Recording duration:  5 minutes       Follow-up: No follow-ups on file.  Butler Der, M.D.

## 2023-11-18 NOTE — Progress Notes (Unsigned)
 Cardiology Office Note:   Date:  11/19/2023  ID:  Stacey Reyes, DOB 04/26/1945, MRN [id number] PCP: Stacey Norene HERO, DO  [facility] Providers Cardiologist:  Lynwood Schilling, MD {  History of Present Illness:   Stacey Reyes is a 78 y.o. female who was referred by Stacey Norene HERO, DO for evaluation of a heart murmur .  She saw Dr. Esmeralda Reyes for evaluation of palpitations.  She was seen in 2017 with SVT or afib suspected but not detected on event monitor.   Echo EF was normal in 2017.  I saw her with a murmur that was thought to be more prominent.   Echo EF was 75% with severe LVH.   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  She was admitted to the hospital in 2023.  She was found to have profound anemia. She was found to have severe B12 deficiency.  She required transfusions.  Of note I did send her for a PYP scan.  I had wanted to get an MRI but cost was prohibitive at that time.  She subsequently had this and was found to have asymmetric septal hypertrophy.  There was no evidence of amyloid.   She was admitted with GI blood loss with capsule endoscopy indicating possible lymphangiectasias .  She has had clipping and says that the bleeding is gone and her hemoglobin has normalized and been stable.  She did see a geneticist and had no gene identifiable.  She says she has done quite well. The patient denies any new symptoms such as chest discomfort, neck or arm discomfort. There has been no new shortness of breath, PND or orthopnea. There have been no reported palpitations, presyncope or syncope. She shags.  She has no symptoms with this level of activity or doing her yard work.   ROS: As stated in the HPI and negative for all other systems.  Studies Reviewed:    EKG:   EKG Interpretation Date/Time:  Wednesday November 19 2023 15:24:47 EDT Ventricular Rate:  65 PR Interval:  224 QRS Duration:  110 QT Interval:  434 QTC Calculation: 451 R Axis:   37  Text Interpretation: Sinus  rhythm with 1st degree A-V block Possible Left atrial enlargement Left ventricular hypertrophy with repolarization abnormality ( Sokolow-Lyon , Cornell product ) Cannot rule out Septal infarct (cited on or before 18-Dec-2021) When compared with ECG of 18-Dec-2021 17:30, No significant change was found Confirmed by Schilling Lynwood ([id number]) on 11/19/2023 3:58:21 PM    Risk Assessment/Calculations:              Physical Exam:   VS:  BP 122/70   Pulse 65   Ht 5' 6 (1.676 m)   Wt 189 lb (85.7 kg)   BMI 30.51 kg/m    Wt Readings from Last 3 Encounters:  11/19/23 189 lb (85.7 kg)  11/17/23 188 lb 3.2 oz (85.4 kg)  08/07/23 188 lb 15 oz (85.7 kg)     GEN: Well nourished, well developed in no acute distress NECK: No JVD; No carotid bruits CARDIAC: RRR, 3 out of 6 systolic murmur radiating at the aortic outflow tract and increasing with the strain phase of Valsalva, no diastolic murmurs, rubs, gallops RESPIRATORY:  Clear to auscultation without rales, wheezing or rhonchi  ABDOMEN: Soft, non-tender, non-distended EXTREMITIES:  No edema; No deformity   ASSESSMENT AND PLAN:   Dyspnea:    This has resolved.  She has had no new symptoms since I last saw her.  No  change in therapy.  LVH/HCM:    She has asymmetric septal hypertrophy.   She has had genetic counseling with no identifiable gene.  Her son has apparently been diagnosed and is being followed.  Her grandchildren have been screened.  She has no high risk features or findings.  No change in therapy.   Hypertension:   Her blood pressure is well-controlled.  No change in therapy.         Follow up with me in 2 years or sooner if needed.  We talked about symptoms that would prompt further evaluation.  Signed, Lynwood Schilling, MD

## 2023-11-19 ENCOUNTER — Ambulatory Visit: Payer: Self-pay

## 2023-11-19 ENCOUNTER — Encounter: Payer: Self-pay | Admitting: Cardiology

## 2023-11-19 ENCOUNTER — Ambulatory Visit (INDEPENDENT_AMBULATORY_CARE_PROVIDER_SITE_OTHER): Admitting: Cardiology

## 2023-11-19 VITALS — BP 122/70 | HR 65 | Ht 66.0 in | Wt 189.0 lb

## 2023-11-19 DIAGNOSIS — I517 Cardiomegaly: Secondary | ICD-10-CM

## 2023-11-19 DIAGNOSIS — R0602 Shortness of breath: Secondary | ICD-10-CM | POA: Diagnosis not present

## 2023-11-19 DIAGNOSIS — I10 Essential (primary) hypertension: Secondary | ICD-10-CM | POA: Diagnosis not present

## 2023-11-19 NOTE — Patient Instructions (Addendum)
 Medication Instructions:   Continue all current medications.   Labwork:  none  Testing/Procedures:  none  Follow-Up:  Your physician wants you to follow up in:  2 years.  You should receive a recall letter in the mail about 2 months prior to the time you are due.  If you don't receive this, please call our office to schedule your follow up appointment.      Any Other Special Instructions Will Be Listed Below (If Applicable).   If you need a refill on your cardiac medications before your next appointment, please call your pharmacy.

## 2023-11-19 NOTE — Telephone Encounter (Signed)
 FYI Only or Action Required?: Action required by provider: fluconazole  request.  Patient was last seen in primary care on 11/17/2023 by Zollie Lowers, MD.  Called Nurse Triage reporting New Med Request and Vaginitis.  Symptoms began today.  Interventions attempted: Nothing.  Symptoms are: gradually worsening.  Triage Disposition: Home Care  Patient/caregiver understands and will follow disposition?: No, wishes to speak with PCP  Copied from CRM [id number]. Topic: Clinical - Red Word Triage >> Nov 19, 2023  4:44 PM Tobias L wrote: Red Word that prompted transfer to Nurse Triage: patient developing yeast infection, requesting something to be called in. Burning and itching, requesting fluconazole  150mg  Reason for Disposition  Mild vulvar itching  Answer Assessment - Initial Assessment Questions Additional info: Patient recently treated with antibiotic for skin infection, she states she usually takes fluconazole  with antibiotics but not this time. She developed external vulvar itching and burning today. Requesting fluconazole  to be sent to [facility] today. States the more we talk about this round and round the more likely I'm going to get infected. Also requesting call back if medication cannot be sent today.    1. SYMPTOM: What's the main symptom you're concerned about? (e.g., rash, itching, swelling, dryness)     Itching and burning 2. LOCATION: Where is the  burning and itching located? (e.g., inside/outside, left/right)     External vulvar 3. ONSET: When did the  burning and itching  start?     today 4. PAIN: Is there any pain? If Yes, ask: How bad is it? (Scale: 0-10; none, mild, moderate, severe)     no 5. CAUSE: What do you think is causing the symptoms?     yeast 6. OTHER SYMPTOMS: Do you have any other symptoms? (e.g., fever, vaginal bleeding, pain with urination)     Finished antibiotics for suspected poison ivy.  Protocols used: Vulvar Symptoms-A-AH

## 2023-11-20 ENCOUNTER — Ambulatory Visit: Payer: Self-pay

## 2023-11-20 ENCOUNTER — Other Ambulatory Visit: Payer: Self-pay | Admitting: Family Medicine

## 2023-11-20 NOTE — Telephone Encounter (Signed)
 FYI Only or Action Required?: FYI only for provider.  Patient was last seen in primary care on 11/17/2023 by Zollie Lowers, MD.  Called Nurse Triage reporting No chief complaint on file..  Symptoms began yesterday.  Interventions attempted: Nothing.  Symptoms are: unchanged.  Triage Disposition: Information or Advice Only Call  Patient/caregiver understands and will follow disposition?: Yes     Copied from CRM [id number]. Topic: Clinical - Red Word Triage >> Nov 20, 2023 10:38 AM Harlene ORN wrote: Red Word that prompted transfer to Nurse Triage: yeast infection; burning and itching Reason for Disposition  Health information question, no triage required and triager able to answer question  Answer Assessment - Initial Assessment Questions Pt called in to get a medication sent in for yeast infection symptoms. Pt triaged yesterday and office advised an appointment was needed. Offered Friday AM appointment times with DOD and patient denied. Advised pt to UC due to no appointments available until Tuesday 9/2. No new symptoms reported.    1. REASON FOR CALL: What is the main reason for your call? or How can I best help you?     Wanted medication called in for yeast infection symptoms  2. SYMPTOMS : Do you have any symptoms?      Burning and itching  Protocols used: Information Only Call - No Triage-A-AH

## 2023-11-20 NOTE — Telephone Encounter (Signed)
 Lmtcb to schedule an apt

## 2024-01-06 ENCOUNTER — Other Ambulatory Visit: Payer: Self-pay | Admitting: Cardiology

## 2024-01-07 ENCOUNTER — Encounter: Payer: Self-pay | Admitting: Family Medicine

## 2024-01-07 ENCOUNTER — Telehealth: Payer: Self-pay | Admitting: Cardiology

## 2024-01-07 MED ORDER — BISOPROLOL FUMARATE 5 MG PO TABS: 2.5000 mg | ORAL_TABLET | Freq: Every day | ORAL | 3 refills | Status: AC

## 2024-01-07 NOTE — Telephone Encounter (Signed)
*  STAT* If patient is at the pharmacy, call can be transferred to refill team.   1. Which medications need to be refilled? (please list name of each medication and dose if known)   bisoprolol  (ZEBETA ) 5 MG tablet     2. Would you like to learn more about the convenience, safety, & potential cost savings by using the [facility]? No   3. Are you open to using the [facility] (Type [facility].) No   4. Which pharmacy/location (including street and city if local pharmacy) is medication to be sent to?  [facility] - [place], [place] - [address]   5. Do they need a 30 day or 90 day supply? 90 day  Pt has only 3 days worth of medication left

## 2024-01-07 NOTE — Telephone Encounter (Signed)
 Medication was sent in

## 2024-01-08 ENCOUNTER — Encounter: Payer: Self-pay | Admitting: Cardiology

## 2024-01-20 ENCOUNTER — Encounter: Payer: Self-pay | Admitting: Family Medicine

## 2024-01-20 ENCOUNTER — Ambulatory Visit: Admitting: Family Medicine

## 2024-01-20 VITALS — BP 113/68 | HR 65 | Temp 97.8°F | Ht 65.5 in | Wt 193.2 lb

## 2024-01-20 DIAGNOSIS — E782 Mixed hyperlipidemia: Secondary | ICD-10-CM

## 2024-01-20 DIAGNOSIS — Z0000 Encounter for general adult medical examination without abnormal findings: Secondary | ICD-10-CM

## 2024-01-20 DIAGNOSIS — D500 Iron deficiency anemia secondary to blood loss (chronic): Secondary | ICD-10-CM

## 2024-01-20 DIAGNOSIS — Z0001 Encounter for general adult medical examination with abnormal findings: Secondary | ICD-10-CM | POA: Diagnosis not present

## 2024-01-20 DIAGNOSIS — I10 Essential (primary) hypertension: Secondary | ICD-10-CM

## 2024-01-20 DIAGNOSIS — I422 Other hypertrophic cardiomyopathy: Secondary | ICD-10-CM

## 2024-01-20 DIAGNOSIS — E559 Vitamin D deficiency, unspecified: Secondary | ICD-10-CM

## 2024-01-20 LAB — LIPID PANEL

## 2024-01-20 NOTE — Patient Instructions (Addendum)
 Colonoscopy due 10/2025 Mammogram due

## 2024-01-20 NOTE — Progress Notes (Signed)
 Stacey Reyes is a 78 y.o. female presents to office today for annual physical exam examination.    She is doing well. No concerns today.    Occupation: retired, Substance use: none Health Maintenance Due  Topic Date Due   COVID-19 Vaccine (5 - 2025-26 season) 11/24/2023    Immunization History  Administered Date(s) Administered   Fluad Quad(high Dose 65+) 11/23/2018, 12/14/2019, 12/05/2020, 11/17/2023   Fluad Trivalent(High Dose 65+) 12/25/2022   Influenza Inj Mdck Quad Pf 01/01/2016, 01/07/2017   Influenza-Unspecified 01/01/2016, 01/07/2017   Moderna Sars-Covid-2 Vaccination 04/30/2019, 05/29/2019, 01/19/2020, 07/20/2020   Pneumococcal Conjugate-13 03/31/2019   Pneumococcal Polysaccharide-23 02/27/2010, 11/08/2013   Tdap 01/29/2012, 07/16/2023   Zoster Recombinant(Shingrix ) 08/01/2016, 10/31/2016   Past Medical History:  Diagnosis Date   Anemia    Asymmetric septal hypertrophy    cMRI 07/2019: asymmetric hypertrophy measuring up to 25mm in basal septum (11mm in posterior wall), c/w hypertrophic CM, LVOT obstruction d/t SAM of the MV anterior leaflet, c/w HOCM ; no identifiable gene   Blood transfusion without reported diagnosis    Cataract    removed bilateral   Chronic cystitis    Diverticulitis    GERD (gastroesophageal reflux disease)    Heart murmur    Hyperlipidemia    Hypertension    Personal history of colonic polyps-adenomas 01/07/2012   2009 - 2 diminutive adenomas (prior polyps also) 01/07/2012 - 2 diminutive adenomas     Social History   Socioeconomic History   Marital status: Married    Spouse name: Not on file   Number of children: 1   Years of education: Not on file   Highest education level: Not on file  Occupational History   Occupation: retired  Tobacco Use   Smoking status: Former    Current packs/day: 0.00    Types: Cigarettes    Quit date: 12/23/1984    Years since quitting: 39.1   Smokeless tobacco: Never  Vaping Use   Vaping  status: Never Used  Substance and Sexual Activity   Alcohol use: Yes    Comment: wine occasionally, maybe twice a month   Drug use: No   Sexual activity: Not on file  Other Topics Concern   Not on file  Social History Narrative   Patient is married and retired and has 1 grown child   Social Drivers of Corporate Investment Banker Strain: Low Risk  (04/02/2023)   Overall Financial Resource Strain (CARDIA)    Difficulty of Paying Living Expenses: Not hard at all  Food Insecurity: No Food Insecurity (04/02/2023)   Hunger Vital Sign    Worried About Radiation Protection Practitioner of Food in the Last Year: Never true    Ran Out of Food in the Last Year: Never true  Transportation Needs: No Transportation Needs (04/02/2023)   PRAPARE - Administrator, Civil Service (Medical): No    Lack of Transportation (Non-Medical): No  Physical Activity: Insufficiently Active (04/02/2023)   Exercise Vital Sign    Days of Exercise per Week: 3 days    Minutes of Exercise per Session: 30 min  Stress: No Stress Concern Present (04/02/2023)   [employer] of Occupational Health - Occupational Stress Questionnaire    Feeling of Stress : Not at all  Social Connections: Moderately Integrated (04/02/2023)   Social Connection and Isolation Panel    Frequency of Communication with Friends and Family: More than three times a week    Frequency of Social Gatherings with Friends and  Family: Three times a week    Attends Religious Services: 1 to 4 times per year    Active Member of Clubs or Organizations: No    Attends Banker Meetings: Never    Marital Status: Married  Catering Manager Violence: Not At Risk (04/02/2023)   Humiliation, Afraid, Rape, and Kick questionnaire    Fear of Current or Ex-Partner: No    Emotionally Abused: No    Physically Abused: No    Sexually Abused: No   Past Surgical History:  Procedure Laterality Date   BREAST BIOPSY Right    No Scar seen    BREAST BIOPSY Left 05/10/2022    MM LT BREAST BX W LOC DEV EA AD LESION IMG BX SPEC STEREO GUIDE 05/10/2022 [facility]   BREAST BIOPSY Left 05/10/2022   MM LT BREAST BX W LOC DEV 1ST LESION IMAGE BX SPEC STEREO GUIDE 05/10/2022 [facility]   BREAST BIOPSY  08/20/2022   MM LT RADIOACTIVE SEED EA ADD LESION LOC MAMMO GUIDE 08/20/2022 [facility]   BREAST BIOPSY  08/20/2022   MM LT RADIOACTIVE SEED LOC MAMMO GUIDE 08/20/2022 [facility]   CATARACT EXTRACTION Bilateral    COLONOSCOPY  01/2020   Dr. Albertus; Five 3 to 9 mm polyps in the ascending colon, Two 4 to 5 mm polyps in the transverse colon, diverticulosis, eryethema and petechia in left colon biopsied, external and internal hemorrhoids. Pathology with tubular adenomas. Left colon biopsy was benign. Recommended 3 year surveillance.   COLONOSCOPY WITH PROPOFOL  N/A 11/08/2022   Procedure: COLONOSCOPY WITH PROPOFOL ;  Surgeon: Cindie Carlin POUR, DO;  [facility];  Service: Endoscopy;  Laterality: N/A;  8:30 am, asa 3   ENTEROSCOPY N/A 09/26/2021   Procedure: ENTEROSCOPY;  Surgeon: Avram Lupita BRAVO, MD;  Location: [facility];  Service: Gastroenterology;  Laterality: N/A;   ENTEROSCOPY N/A 12/19/2021   Procedure: ENTEROSCOPY;  Surgeon: Shaaron Lamar HERO, MD;  [facility]: [facility];  Service: Endoscopy;  Laterality: N/A;   ESOPHAGOGASTRODUODENOSCOPY N/A 10/07/2021   Procedure: ESOPHAGOGASTRODUODENOSCOPY (EGD);  Surgeon: Shaaron Lamar HERO, MD;  [facility];  Service: Endoscopy;  Laterality: N/A;  EGD with enteroscopy  with pediatric colonoscope   ESOPHAGOGASTRODUODENOSCOPY (EGD) WITH PROPOFOL  N/A 11/23/2019   Procedure: ESOPHAGOGASTRODUODENOSCOPY (EGD) WITH PROPOFOL ;  Surgeon: Shaaron Lamar HERO, MD;  [facility];  Service: Endoscopy;  Laterality: N/A;   ESOPHAGOGASTRODUODENOSCOPY (EGD) WITH PROPOFOL  N/A 10/20/2020   Procedure: ESOPHAGOGASTRODUODENOSCOPY (EGD) WITH PROPOFOL ;  Surgeon: Cindie Carlin POUR, DO;  [facility];  Service: Endoscopy;  Laterality: N/A;   ESOPHAGOGASTRODUODENOSCOPY (EGD) WITH PROPOFOL  N/A 12/05/2020   Procedure: ESOPHAGOGASTRODUODENOSCOPY (EGD) WITH PROPOFOL ;  Surgeon: Golda Claudis PENNER, MD;  [facility];  Service: Endoscopy;  Laterality: N/A;  with enteroscopy   ESOPHAGOGASTRODUODENOSCOPY (EGD) WITH PROPOFOL  N/A 12/11/2020   Procedure: ESOPHAGOGASTRODUODENOSCOPY (EGD) WITH PROPOFOL ;  Surgeon: Cindie Carlin POUR, DO;  [facility];  Service: Endoscopy;  Laterality: N/A;   ESOPHAGOGASTRODUODENOSCOPY (EGD) WITH PROPOFOL  N/A 09/17/2021   Procedure: ESOPHAGOGASTRODUODENOSCOPY (EGD) WITH PROPOFOL ;  Surgeon: Cindie Carlin POUR, DO;  [facility];  Service: Endoscopy;  Laterality: N/A;   ESOPHAGOGASTRODUODENOSCOPY (EGD) WITH PROPOFOL  N/A 12/19/2021   Procedure: ESOPHAGOGASTRODUODENOSCOPY (EGD) WITH PROPOFOL ;  Surgeon: Shaaron Lamar HERO, MD;  [facility];  Service: Endoscopy;  Laterality: N/A;  with pediatric scope   GIVENS CAPSULE STUDY N/A 11/23/2019   Procedure: GIVENS CAPSULE STUDY;  Surgeon: Shaaron Lamar HERO, MD;  [facility];  Service: Endoscopy;  Laterality: N/A;   GIVENS CAPSULE STUDY N/A 10/22/2020   Procedure: GIVENS CAPSULE STUDY;  Surgeon: Cindie Carlin POUR, DO;  [facility];  Service: Endoscopy;  Laterality: N/A;   GIVENS CAPSULE STUDY N/A 09/26/2021   Procedure: GIVENS CAPSULE STUDY;  Surgeon: Avram Lupita BRAVO, MD;  Location[facility];  Service: Gastroenterology;  Laterality: N/A;   HEMOSTASIS CLIP PLACEMENT  12/11/2020   Procedure: HEMOSTASIS CLIP PLACEMENT;  Surgeon: Cindie Carlin POUR, DO;  [facility];  Service: Endoscopy;;   IR ANGIOGRAM SELECTIVE EACH ADDITIONAL VESSEL  12/05/2020   IR ANGIOGRAM SELECTIVE EACH ADDITIONAL VESSEL  12/05/2020   IR ANGIOGRAM SELECTIVE EACH ADDITIONAL VESSEL  12/05/2020   IR ANGIOGRAM SELECTIVE EACH ADDITIONAL VESSEL  12/13/2020   IR ANGIOGRAM SELECTIVE EACH  ADDITIONAL VESSEL  12/13/2020   IR ANGIOGRAM VISCERAL SELECTIVE  12/05/2020   IR ANGIOGRAM VISCERAL SELECTIVE  12/13/2020   IR EMBO ART  VEN HEMORR LYMPH EXTRAV  INC GUIDE ROADMAPPING  12/05/2020   IR EMBO ART  VEN HEMORR LYMPH EXTRAV  INC GUIDE ROADMAPPING  12/13/2020   IR RADIOLOGIST EVAL & MGMT  01/11/2021   IR US  GUIDE VASC ACCESS LEFT  12/13/2020   IR US  GUIDE VASC ACCESS RIGHT  12/05/2020   POLYPECTOMY  11/08/2022   Procedure: POLYPECTOMY;  Surgeon: Cindie Carlin POUR, DO;  [facility];  Service: Endoscopy;;   RADIOACTIVE SEED GUIDED EXCISIONAL BREAST BIOPSY Left 08/22/2022   Procedure: RADIOACTIVE SEED GUIDED BRACKETED  EXCISIONAL LEFT BREAST BIOPSY;  Surgeon: Ebbie Cough, MD;  Location: [facility] OR;  Service: General;  Laterality: Left;   SUBMUCOSAL TATTOO INJECTION  09/26/2021   Procedure: SUBMUCOSAL TATTOO INJECTION;  Surgeon: Avram Lupita BRAVO, MD;  Location: [facility] END[facility]COPY;  Service: Gastroenterology;;   Family History  Problem Relation Age of Onset   Colon cancer Mother 109       29's   Breast cancer Sister    Asthma Brother    Colon polyps Neg Hx    Esophageal cancer Neg Hx    Rectal cancer Neg Hx    Stomach cancer Neg Hx     Current Outpatient Medications:    acetaminophen  (TYLENOL ) 325 MG tablet, Take 325 mg by mouth every 6 (six) hours as needed for moderate pain., Disp: , Rfl:    Ascorbic Acid (VITAMIN C PO), Take 1 tablet by mouth daily., Disp: , Rfl:    bisoprolol  (ZEBETA ) 5 MG tablet, Take 0.5 tablets (2.5 mg total) by mouth daily., Disp: 45 tablet, Rfl: 3   CALCIUM  PO, Take 1 tablet by mouth daily., Disp: , Rfl:    Cholecalciferol (VITAMIN D -3 PO), Take 1 capsule by mouth daily., Disp: , Rfl:    COPPER  PO, Take 1 capsule by mouth daily., Disp: , Rfl:    cyanocobalamin  (CYANOCOBALAMIN ) 500 MCG tablet, Take 500 mcg by mouth daily., Disp: , Rfl:    ferrous sulfate  325 (65 FE) MG tablet, TAKE 1 TABLET BY MOUTH EVERY DAY, Disp: 90 tablet, Rfl: 0   fluticasone   (FLONASE ) 50 MCG/ACT nasal spray, SPRAY 2 SPRAYS INTO EACH NOSTRIL EVERY DAY (Patient taking differently: 2 sprays as needed.), Disp: 48 mL, Rfl: 2   gabapentin  (NEURONTIN ) 300 MG capsule, Take 1 capsule (300 mg total) by mouth at bedtime. (Patient taking differently: Take 300 mg by mouth as needed.), Disp: 90 capsule, Rfl: 3   levocetirizine (XYZAL ) 5 MG tablet, Take 1 tablet (5 mg total) by mouth every evening. (  Patient taking differently: Take 5 mg by mouth as needed.), Disp: 90 tablet, Rfl: 3   MAGNESIUM  PO, Take 1 tablet by mouth daily., Disp: , Rfl:    meloxicam  (MOBIC ) 15 MG tablet, TAKE 0.5-1 TABLET(7.5-15 MG) DAILY AS NEEDED FOR PAIN (USE SPARINGLY. CAN INCREASE GI BLEEDING.). (Patient not taking: Reported on 11/19/2023), Disp: 100 tablet, Rfl: 1   Na Sulfate-K Sulfate-Mg Sulf 17.5-3.13-1.6 GM/177ML SOLN, As directed, Disp: 354 mL, Rfl: 0   pantoprazole  (PROTONIX ) 40 MG tablet, TAKE 1 TABLET BY MOUTH TWICE  DAILY, Disp: 200 tablet, Rfl: 2   promethazine -dextromethorphan (PROMETHAZINE -DM) 6.25-15 MG/5ML syrup, Take 5 mLs by mouth 4 (four) times daily as needed., Disp: 118 mL, Rfl: 0   rosuvastatin  (CRESTOR ) 10 MG tablet, TAKE 1 TABLET BY MOUTH AT  BEDTIME, Disp: 100 tablet, Rfl: 2   triamcinolone  cream (KENALOG ) 0.1 %, Apply 1 Application topically 3 (three) times daily. Avoid face and genitalia (Patient taking differently: Apply 1 Application topically as needed. Avoid face and genitalia), Disp: 45 g, Rfl: 0   Vitamin D , Ergocalciferol , (DRISDOL ) 1.25 MG (50000 UNIT) CAPS capsule, TAKE 1 CAPSULE BY MOUTH ONCE  WEEKLY (Patient not taking: Reported on 11/19/2023), Disp: 15 capsule, Rfl: 2   VITAMIN E  PO, Take 1 tablet by mouth daily., Disp: , Rfl:   Allergies  Allergen Reactions   Ciprofloxacin     Penicillin G Hives   Sulfa Antibiotics Rash     ROS: Review of Systems Pertinent items noted in HPI and remainder of comprehensive ROS otherwise negative.    Physical exam BP 113/68   Pulse  65   Temp 97.8 F (36.6 C)   Ht 5' 5.5 (1.664 m)   Wt 193 lb 4 oz (87.7 kg)   SpO2 95%   BMI 31.67 kg/m  General appearance: alert, cooperative, appears stated age, no distress, and mildly obese Head: Normocephalic, without obvious abnormality, atraumatic Eyes: negative findings: lids and lashes normal, conjunctivae and sclerae normal, corneas clear, and pupils equal, round, reactive to light and accomodation Ears: normal TM's and external ear canals both ears Nose: Nares normal. Septum midline. Mucosa normal. No drainage or sinus tenderness. Throat: lips, mucosa, and tongue normal; teeth and gums normal Neck: no adenopathy, no carotid bruit, supple, symmetrical, trachea midline, and thyroid  not enlarged, symmetric, no tenderness/mass/nodules Back: symmetric, no curvature. ROM normal. No CVA tenderness. Lungs: clear to auscultation bilaterally Heart: regular rate and rhythm, S1, S2 normal, + murmur w/ occasional skipped beats. No click, rub or gallop Abdomen: soft, non-tender; bowel sounds normal; no masses,  no organomegaly Extremities: extremities normal, atraumatic, no cyanosis or edema Pulses: 2+ and symmetric Skin: Skin color, texture, turgor normal. No rashes or lesions Lymph nodes: Cervical, supraclavicular, and axillary nodes normal. Neurologic: Grossly normal      01/20/2024    1:34 PM 11/17/2023    1:50 PM 07/16/2023   11:22 AM  Depression screen PHQ 2/9  Decreased Interest 0 0 0  Down, Depressed, Hopeless 1 0 0  PHQ - 2 Score 1 0 0  Altered sleeping 2 2 3   Tired, decreased energy 0 0 0  Change in appetite 1 0 0  Feeling bad or failure about yourself  0 0 0  Trouble concentrating 0 0 0  Moving slowly or fidgety/restless 0 0 0  Suicidal thoughts 0  0  PHQ-9 Score 4 2 3   Difficult doing work/chores Not difficult at all Not difficult at all Somewhat difficult      01/20/2024  1:34 PM 11/17/2023    1:51 PM 07/16/2023   11:22 AM 05/08/2023    9:08 AM  GAD 7 :  Generalized Anxiety Score  Nervous, Anxious, on Edge 1 3 1 1   Control/stop worrying 0 3 0 1  Worry too much - different things 1 3 1 1   Trouble relaxing 1 2 0 1  Restless 1 1 0 0  Easily annoyed or irritable 1 1 0 0  Afraid - awful might happen 0 3 0 0  Total GAD 7 Score 5 16 2 4   Anxiety Difficulty Not difficult at all Somewhat difficult Somewhat difficult Somewhat difficult    No results found for this or any previous visit (from the past 2160 hours).   Assessment/ Plan: [name] here for annual physical exam.   Annual physical exam  Iron deficiency anemia secondary to blood loss (chronic) - Plan: CMP14+EGFR, CBC with Differential  Mixed hyperlipidemia - Plan: CMP14+EGFR, Lipid Panel, TSH  Hypertension, essential - Plan: CMP14+EGFR  Hypertrophic cardiomyopathy (HCC) - Plan: CMP14+EGFR  Vitamin D  deficiency - Plan: CMP14+EGFR, VITAMIN D  25 Hydroxy (Vit-D Deficiency, Fractures)   Nonfasting labs were collected today.  Does not need refills on anything at this time.  Up-to-date on vaccinations.  Gave her information on when next colonoscopy is due.  She will set up mammogram.  Counseled on healthy lifestyle choices, including diet (rich in fruits, vegetables and lean meats and low in salt and simple carbohydrates) and exercise (at least 30 minutes of moderate physical activity daily).  Patient to follow up 1year  Cassandre Oleksy M. Jolinda, DO

## 2024-01-21 ENCOUNTER — Ambulatory Visit: Payer: Self-pay | Admitting: Family Medicine

## 2024-01-21 LAB — LIPID PANEL
Cholesterol, Total: 161 mg/dL (ref 100–199)
HDL: 43 mg/dL (ref >39)
LDL CALC COMMENT:: 3.7 ratio (ref 0.0–4.4)
LDL Chol Calc (NIH): 81 mg/dL (ref 0–99)
Triglycerides: 224 mg/dL — AB (ref 0–149)
VLDL Cholesterol Cal: 37 mg/dL (ref 5–40)

## 2024-01-21 LAB — CMP14+EGFR
ALT: 14 IU/L (ref 0–32)
AST: 21 IU/L (ref 0–40)
Albumin: 4.2 g/dL (ref 3.8–4.8)
Alkaline Phosphatase: 47 IU/L — AB (ref 49–135)
BUN/Creatinine Ratio: 13 (ref 12–28)
BUN: 10 mg/dL (ref 8–27)
Bilirubin Total: 0.6 mg/dL (ref 0.0–1.2)
CO2: 23 mmol/L (ref 20–29)
Calcium: 9.5 mg/dL (ref 8.7–10.3)
Chloride: 99 mmol/L (ref 96–106)
Creatinine, Ser: 0.79 mg/dL (ref 0.57–1.00)
Globulin, Total: 2.2 g/dL (ref 1.5–4.5)
Glucose: 150 mg/dL — AB (ref 70–99)
Potassium: 3.6 mmol/L (ref 3.5–5.2)
Sodium: 138 mmol/L (ref 134–144)
Total Protein: 6.4 g/dL (ref 6.0–8.5)
eGFR: 77 mL/min/1.73 (ref >59)

## 2024-01-21 LAB — CBC WITH DIFFERENTIAL/PLATELET
Basophils Absolute: 0.1 x10E3/uL (ref 0.0–0.2)
Basos: 1 %
EOS (ABSOLUTE): 0.2 x10E3/uL (ref 0.0–0.4)
Eos: 2 %
Hematocrit: 42.2 % (ref 34.0–46.6)
Hemoglobin: 13.9 g/dL (ref 11.1–15.9)
Immature Grans (Abs): 0 x10E3/uL (ref 0.0–0.1)
Immature Granulocytes: 0 %
Lymphocytes Absolute: 1.9 x10E3/uL (ref 0.7–3.1)
Lymphs: 25 %
MCH: 30.9 pg (ref 26.6–33.0)
MCHC: 32.9 g/dL (ref 31.5–35.7)
MCV: 94 fL (ref 79–97)
Monocytes Absolute: 0.7 x10E3/uL (ref 0.1–0.9)
Monocytes: 9 %
Neutrophils Absolute: 4.9 x10E3/uL (ref 1.4–7.0)
Neutrophils: 63 %
Platelets: 227 x10E3/uL (ref 150–450)
RBC: 4.5 x10E6/uL (ref 3.77–5.28)
RDW: 12.4 % (ref 11.7–15.4)
WBC: 7.7 x10E3/uL (ref 3.4–10.8)

## 2024-01-21 LAB — TSH: TSH: 0.938 u[IU]/mL (ref 0.450–4.500)

## 2024-01-21 LAB — VITAMIN D 25 HYDROXY (VIT D DEFICIENCY, FRACTURES): Vit D, 25-Hydroxy: 50.8 ng/mL (ref 30.0–100.0)

## 2024-01-22 ENCOUNTER — Other Ambulatory Visit: Payer: Self-pay | Admitting: Family Medicine

## 2024-01-22 DIAGNOSIS — Z1231 Encounter for screening mammogram for malignant neoplasm of breast: Secondary | ICD-10-CM

## 2024-01-22 LAB — HGB A1C W/O EAG: Hgb A1c MFr Bld: 5.7 % — ABNORMAL HIGH (ref 4.8–5.6)

## 2024-01-22 LAB — SPECIMEN STATUS REPORT

## 2024-02-02 ENCOUNTER — Ambulatory Visit
Admission: RE | Admit: 2024-02-02 | Discharge: 2024-02-02 | Disposition: A | Discharge status: Home / Self Care | Source: Ambulatory Visit | Attending: Family Medicine | Admitting: Family Medicine

## 2024-02-02 DIAGNOSIS — Z1231 Encounter for screening mammogram for malignant neoplasm of breast: Secondary | ICD-10-CM

## 2024-02-24 ENCOUNTER — Telehealth: Payer: Self-pay | Admitting: Family Medicine

## 2024-02-24 DIAGNOSIS — E782 Mixed hyperlipidemia: Secondary | ICD-10-CM

## 2024-02-24 NOTE — Progress Notes (Signed)
 Pharmacy Quality Measure Review  This patient is appearing on a report for being at risk of failing the adherence measure for cholesterol (statin) medications this calendar year.   Medication: Rosuvastatin  10mg  Last fill date: 07/03 for 100 day supply  Spoke with patient about adherence to this medication, states that she has plenty of it and takes every day. Has no issues or questions at this time. Should have sufficient refills based on medication list.   Angela Baalmann, PharmD [facility] Pharmacist

## 2024-03-06 ENCOUNTER — Other Ambulatory Visit: Payer: Self-pay | Admitting: Family Medicine

## 2024-03-06 DIAGNOSIS — M79641 Pain in right hand: Secondary | ICD-10-CM

## 2024-04-02 ENCOUNTER — Ambulatory Visit: Payer: Medicare Other

## 2024-04-02 VITALS — BP 113/68 | HR 65 | Ht 65.0 in | Wt 193.0 lb

## 2024-04-02 DIAGNOSIS — Z0000 Encounter for general adult medical examination without abnormal findings: Secondary | ICD-10-CM

## 2024-04-02 NOTE — Patient Instructions (Signed)
 [name],  Thank you for taking the time for your Medicare Wellness Visit. I appreciate your continued commitment to your health goals. Please review the care plan we discussed, and feel free to reach out if I can assist you further.  Please note that Annual Wellness Visits do not include a physical exam. Some assessments may be limited, especially if the visit was conducted virtually. If needed, we may recommend an in-person follow-up with your provider.  Ongoing Care Seeing your primary care provider every 3 to 6 months helps us  monitor your health and provide consistent, personalized care.   Referrals If a referral was made during today's visit and you haven't received any updates within two weeks, please contact the referred provider directly to check on the status.  Recommended Screenings:  Health Maintenance  Topic Date Due   COVID-19 Vaccine (5 - 2025-26 season) 11/24/2023   Medicare Annual Wellness Visit  04/01/2024   Osteoporosis screening with Bone Density Scan  07/17/2025   Colon Cancer Screening  11/07/2025   DTaP/Tdap/Td vaccine (3 - Td or Tdap) 07/15/2033   Pneumococcal Vaccine for age over 52  Completed   Flu Shot  Completed   Hepatitis C Screening  Completed   Zoster (Shingles) Vaccine  Completed   Meningitis B Vaccine  Aged Out   Breast Cancer Screening  Discontinued       04/02/2024   11:03 AM  Advanced Directives  Does Patient Have a Medical Advance Directive? No  Would patient like information on creating a medical advance directive? No - Patient declined    Vision: Annual vision screenings are recommended for early detection of glaucoma, cataracts, and diabetic retinopathy. These exams can also reveal signs of chronic conditions such as diabetes and high blood pressure.  Dental: Annual dental screenings help detect early signs of oral cancer, gum disease, and other conditions linked to overall health, including heart disease and diabetes.  Please see the  attached documents for additional preventive care recommendations.

## 2024-04-02 NOTE — Progress Notes (Signed)
 "  Chief Complaint  Patient presents with   Medicare Wellness     Subjective:   Stacey Reyes is a 79 y.o. female who presents for a Medicare Annual Wellness Visit.  Visit info / Clinical Intake: Medicare Wellness Visit Type:: Subsequent Annual Wellness Visit Persons participating in visit and providing information:: patient Medicare Wellness Visit Mode:: Telephone If telephone:: video declined Since this visit was completed virtually, some vitals may be partially provided or unavailable. Missing vitals are due to the limitations of the virtual format.: Unable to obtain vitals - no equipment If Telephone or Video please confirm:: I connected with patient using audio/video enable telemedicine. I verified patient identity with two identifiers, discussed telehealth limitations, and patient agreed to proceed. Patient Location:: home Provider Location:: office Interpreter Needed?: No Pre-visit prep was completed: yes AWV questionnaire completed by patient prior to visit?: no Living arrangements:: lives with spouse/significant other Patient's Overall Health Status Rating: very good Typical amount of pain: none Does pain affect daily life?: no Are you currently prescribed opioids?: no  Dietary Habits and Nutritional Risks How many meals a day?: 3 Eats fruit and vegetables daily?: yes Most meals are obtained by: preparing own meals In the last 2 weeks, have you had any of the following?: none Diabetic:: no  Functional Status Activities of Daily Living (to include ambulation/medication): Independent Ambulation: Independent Medication Administration: Independent Home Management (perform basic housework or laundry): Independent Manage your own finances?: yes Primary transportation is: driving Concerns about vision?: no *vision screening is required for WTM* (last ov last 2 yrs) Concerns about hearing?: (!) yes Uses hearing aids?: no  Fall Screening Falls in the past year?:  0 Number of falls in past year: 0 Was there an injury with Fall?: 0 Fall Risk Category Calculator: 0 Patient Fall Risk Level: Low Fall Risk  Fall Risk Patient at Risk for Falls Due to: No Fall Risks Fall risk Follow up: Falls evaluation completed; Education provided  Home and Transportation Safety: All rugs have non-skid backing?: yes All stairs or steps have railings?: yes Grab bars in the bathtub or shower?: (!) no Have non-skid surface in bathtub or shower?: yes Good home lighting?: yes Regular seat belt use?: yes Hospital stays in the last year:: no  Cognitive Assessment Difficulty concentrating, remembering, or making decisions? : no Will 6CIT or Mini Cog be Completed: yes What year is it?: 0 points What month is it?: 0 points Give patient an address phrase to remember (5 components): [address] [place] what time is it?: 0 points Count backwards from 20 to 1: 0 points Say the months of the year in reverse: 4 points Repeat the address phrase from earlier: 0 points 6 CIT Score: 4 points  Advance Directives (For Healthcare) Does Patient Have a Medical Advance Directive?: No Would patient like information on creating a medical advance directive?: No - Patient declined  Reviewed/Updated  Reviewed/Updated: Reviewed All (Medical, Surgical, Family, Medications, Allergies, Care Teams, Patient Goals); Medical History; Surgical History; Family History; Medications; Allergies; Care Teams; Patient Goals    Allergies (verified) Ciprofloxacin , Penicillin g, and Sulfa antibiotics   Current Medications (verified) Outpatient Encounter Medications as of 04/02/2024  Medication Sig   acetaminophen  (TYLENOL ) 325 MG tablet Take 325 mg by mouth every 6 (six) hours as needed for moderate pain.   Ascorbic Acid (VITAMIN C PO) Take 1 tablet by mouth daily.   bisoprolol  (ZEBETA ) 5 MG tablet Take 0.5 tablets (2.5 mg total) by mouth daily.  CALCIUM  PO Take 1 tablet by mouth  daily.   Cholecalciferol (VITAMIN D -3 PO) Take 1 capsule by mouth daily.   COPPER  PO Take 1 capsule by mouth daily.   cyanocobalamin  (CYANOCOBALAMIN ) 500 MCG tablet Take 500 mcg by mouth daily.   ferrous sulfate  325 (65 FE) MG tablet TAKE 1 TABLET BY MOUTH EVERY DAY   fluticasone  (FLONASE ) 50 MCG/ACT nasal spray SPRAY 2 SPRAYS INTO EACH NOSTRIL EVERY DAY (Patient taking differently: 2 sprays as needed.)   gabapentin  (NEURONTIN ) 300 MG capsule Take 1 capsule (300 mg total) by mouth at bedtime. (Patient taking differently: Take 300 mg by mouth as needed.)   levocetirizine (XYZAL ) 5 MG tablet Take 1 tablet (5 mg total) by mouth every evening. (Patient taking differently: Take 5 mg by mouth as needed.)   MAGNESIUM  PO Take 1 tablet by mouth daily.   meloxicam  (MOBIC ) 15 MG tablet TAKE 0.5-1 TABLET(7.5-15 MG) DAILY AS NEEDED FOR PAIN (USE SPARINGLY. CAN INCREASE GI BLEEDING.).   pantoprazole  (PROTONIX ) 40 MG tablet TAKE 1 TABLET BY MOUTH TWICE  DAILY   rosuvastatin  (CRESTOR ) 10 MG tablet TAKE 1 TABLET BY MOUTH AT  BEDTIME   triamcinolone  cream (KENALOG ) 0.1 % Apply 1 Application topically 3 (three) times daily. Avoid face and genitalia   VITAMIN E  PO Take 1 tablet by mouth daily.   No facility-administered encounter medications on file as of 04/02/2024.    History: Past Medical History:  Diagnosis Date   Anemia    Asymmetric septal hypertrophy    cMRI 07/2019: asymmetric hypertrophy measuring up to 25mm in basal septum (11mm in posterior wall), c/w hypertrophic CM, LVOT obstruction d/t SAM of the MV anterior leaflet, c/w HOCM ; no identifiable gene   Blood transfusion without reported diagnosis    Cataract    removed bilateral   Chronic cystitis    Diverticulitis    GERD (gastroesophageal reflux disease)    Heart murmur    Hyperlipidemia    Hypertension    Personal history of colonic polyps-adenomas 01/07/2012   2009 - 2 diminutive adenomas (prior polyps also) 01/07/2012 - 2 diminutive  adenomas     Past Surgical History:  Procedure Laterality Date   BREAST BIOPSY Right    No Scar seen    BREAST BIOPSY Left 05/10/2022   MM LT BREAST BX W LOC DEV EA AD LESION IMG BX SPEC STEREO GUIDE 05/10/2022 [facility]   BREAST BIOPSY Left 05/10/2022   MM LT BREAST BX W LOC DEV 1ST LESION IMAGE BX SPEC STEREO GUIDE 05/10/2022 [facility]   BREAST BIOPSY  08/20/2022   MM LT RADIOACTIVE SEED EA ADD LESION LOC MAMMO GUIDE 08/20/2022 [facility]   BREAST BIOPSY  08/20/2022   MM LT RADIOACTIVE SEED LOC MAMMO GUIDE 08/20/2022 [facility]   BREAST EXCISIONAL BIOPSY Left    BREAST EXCISIONAL BIOPSY Right    CATARACT EXTRACTION Bilateral    COLONOSCOPY  01/2020   Dr. Albertus; Five 3 to 9 mm polyps in the ascending colon, Two 4 to 5 mm polyps in the transverse colon, diverticulosis, eryethema and petechia in left colon biopsied, external and internal hemorrhoids. Pathology with tubular adenomas. Left colon biopsy was benign. Recommended 3 year surveillance.   COLONOSCOPY WITH PROPOFOL  N/A 11/08/2022   Procedure: COLONOSCOPY WITH PROPOFOL ;  Surgeon: Cindie Carlin POUR, DO;  [facility];  Service: Endoscopy;  Laterality: N/A;  8:30 am, asa 3   ENTEROSCOPY N/A 09/26/2021   Procedure: ENTEROSCOPY;  Surgeon: Avram Lupita BRAVO,  MD;  Location: [facility];  Service: Gastroenterology;  Laterality: N/A;   ENTEROSCOPY N/A 12/19/2021   Procedure: ENTEROSCOPY;  Surgeon: Shaaron Lamar HERO, MD;  [facility];  Service: Endoscopy;  Laterality: N/A;   ESOPHAGOGASTRODUODENOSCOPY N/A 10/07/2021   Procedure: ESOPHAGOGASTRODUODENOSCOPY (EGD);  Surgeon: Shaaron Lamar HERO, MD;  [facility];  Service: Endoscopy;  Laterality: N/A;  EGD with enteroscopy  with pediatric colonoscope   ESOPHAGOGASTRODUODENOSCOPY (EGD) WITH PROPOFOL  N/A 11/23/2019   Procedure: ESOPHAGOGASTRODUODENOSCOPY (EGD) WITH PROPOFOL ;  Surgeon: Shaaron Lamar HERO, MD;  [facility];   Service: Endoscopy;  Laterality: N/A;   ESOPHAGOGASTRODUODENOSCOPY (EGD) WITH PROPOFOL  N/A 10/20/2020   Procedure: ESOPHAGOGASTRODUODENOSCOPY (EGD) WITH PROPOFOL ;  Surgeon: Cindie Carlin POUR, DO;  [facility];  Service: Endoscopy;  Laterality: N/A;   ESOPHAGOGASTRODUODENOSCOPY (EGD) WITH PROPOFOL  N/A 12/05/2020   Procedure: ESOPHAGOGASTRODUODENOSCOPY (EGD) WITH PROPOFOL ;  Surgeon: Golda Claudis PENNER, MD;  [facility];  Service: Endoscopy;  Laterality: N/A;  with enteroscopy   ESOPHAGOGASTRODUODENOSCOPY (EGD) WITH PROPOFOL  N/A 12/11/2020   Procedure: ESOPHAGOGASTRODUODENOSCOPY (EGD) WITH PROPOFOL ;  Surgeon: Cindie Carlin POUR, DO;  [facility];  Service: Endoscopy;  Laterality: N/A;   ESOPHAGOGASTRODUODENOSCOPY (EGD) WITH PROPOFOL  N/A 09/17/2021   Procedure: ESOPHAGOGASTRODUODENOSCOPY (EGD) WITH PROPOFOL ;  Surgeon: Cindie Carlin POUR, DO;  [facility];  Service: Endoscopy;  Laterality: N/A;   ESOPHAGOGASTRODUODENOSCOPY (EGD) WITH PROPOFOL  N/A 12/19/2021   Procedure: ESOPHAGOGASTRODUODENOSCOPY (EGD) WITH PROPOFOL ;  Surgeon: Shaaron Lamar HERO, MD;  [facility];  Service: Endoscopy;  Laterality: N/A;  with pediatric scope   GIVENS CAPSULE STUDY N/A 11/23/2019   Procedure: GIVENS CAPSULE STUDY;  Surgeon: Shaaron Lamar HERO, MD;  [facility];  Service: Endoscopy;  Laterality: N/A;   GIVENS CAPSULE STUDY N/A 10/22/2020   Procedure: GIVENS CAPSULE STUDY;  Surgeon: Cindie Carlin POUR, DO;  [facility];  Service: Endoscopy;  Laterality: N/A;   GIVENS CAPSULE STUDY N/A 09/26/2021   Procedure: GIVENS CAPSULE STUDY;  Surgeon: Avram Lupita BRAVO, MD;  [facility];  Service: Gastroenterology;  Laterality: N/A;   HEMOSTASIS CLIP PLACEMENT  12/11/2020   Procedure: HEMOSTASIS CLIP PLACEMENT;  Surgeon: Cindie Carlin POUR, DO;  [facility];  Service: Endoscopy;;   IR ANGIOGRAM SELECTIVE EACH ADDITIONAL VESSEL  12/05/2020    IR ANGIOGRAM SELECTIVE EACH ADDITIONAL VESSEL  12/05/2020   IR ANGIOGRAM SELECTIVE EACH ADDITIONAL VESSEL  12/05/2020   IR ANGIOGRAM SELECTIVE EACH ADDITIONAL VESSEL  12/13/2020   IR ANGIOGRAM SELECTIVE EACH ADDITIONAL VESSEL  12/13/2020   IR ANGIOGRAM VISCERAL SELECTIVE  12/05/2020   IR ANGIOGRAM VISCERAL SELECTIVE  12/13/2020   IR EMBO ART  VEN HEMORR LYMPH EXTRAV  INC GUIDE ROADMAPPING  12/05/2020   IR EMBO ART  VEN HEMORR LYMPH EXTRAV  INC GUIDE ROADMAPPING  12/13/2020   IR RADIOLOGIST EVAL & MGMT  01/11/2021   IR US  GUIDE VASC ACCESS LEFT  12/13/2020   IR US  GUIDE VASC ACCESS RIGHT  12/05/2020   POLYPECTOMY  11/08/2022   Procedure: POLYPECTOMY;  Surgeon: Cindie Carlin POUR, DO;  [facility];  Service: Endoscopy;;   RADIOACTIVE SEED GUIDED EXCISIONAL BREAST BIOPSY Left 08/22/2022   Procedure: RADIOACTIVE SEED GUIDED BRACKETED  EXCISIONAL LEFT BREAST BIOPSY;  Surgeon: Ebbie Cough, MD;  [facility];  Service: General;  Laterality: Left;   SUBMUCOSAL TATTOO INJECTION  09/26/2021   Procedure: SUBMUCOSAL TATTOO INJECTION;  Surgeon: Avram Lupita BRAVO, MD;  Location: [facility];  Service: Gastroenterology;;   Family History  Problem Relation Age of Onset   Colon cancer Mother 9       25's   Breast cancer Sister    Asthma Brother    Colon polyps Neg Hx    Esophageal cancer Neg Hx    Rectal cancer Neg Hx    Stomach cancer Neg Hx    Social History   Occupational History   Occupation: retired  Tobacco Use   Smoking status: Former    Current packs/day: 0.00    Types: Cigarettes    Quit date: 12/23/1984    Years since quitting: 39.3   Smokeless tobacco: Never  Vaping Use   Vaping status: Never Used  Substance and Sexual Activity   Alcohol use: Yes    Comment: wine occasionally, maybe twice a month   Drug use: No   Sexual activity: Not on file   Tobacco Counseling Counseling given: Yes  SDOH Screenings   Food Insecurity: No Food Insecurity (04/02/2024)   Housing: Unknown (04/02/2024)  Transportation Needs: No Transportation Needs (04/02/2024)  Utilities: Not At Risk (04/02/2024)  Alcohol Screen: Low Risk (04/02/2023)  Depression (PHQ2-9): Low Risk (04/02/2024)  Financial Resource Strain: Low Risk (04/02/2023)  Physical Activity: Inactive (04/02/2024)  Social Connections: Moderately Integrated (04/02/2024)  Stress: No Stress Concern Present (04/02/2024)  Tobacco Use: Medium Risk (04/02/2024)  Health Literacy: Adequate Health Literacy (04/02/2024)   See flowsheets for full screening details  Depression Screen PHQ 2 & 9 Depression Scale- Over the past 2 weeks, how often have you been bothered by any of the following problems? Little interest or pleasure in doing things: 0 Feeling down, depressed, or hopeless (PHQ Adolescent also includes...irritable): 0 PHQ-2 Total Score: 0 Trouble falling or staying asleep, or sleeping too much: 2 Feeling tired or having little energy: 0 Poor appetite or overeating (PHQ Adolescent also includes...weight loss): 1 Feeling bad about yourself - or that you are a failure or have let yourself or your family down: 0 Trouble concentrating on things, such as reading the newspaper or watching television (PHQ Adolescent also includes...like school work): 0 Moving or speaking so slowly that other people could have noticed. Or the opposite - being so fidgety or restless that you have been moving around a lot more than usual: 0 Thoughts that you would be better off dead, or of hurting yourself in some way: 0 PHQ-9 Total Score: 4 If you checked off any problems, how difficult have these problems made it for you to do your work, take care of things at home, or get along with other people?: Not difficult at all  Depression Treatment Depression Interventions/Treatment : Patient refuses Treatment     Goals Addressed             This Visit's Progress    Remain active and independent   On track            Objective:    Today's  Vitals   04/02/24 1101  BP: 113/68  Pulse: 65  Weight: 193 lb (87.5 kg)  Height: 5' 5 (1.651 m)   Body mass index is 32.12 kg/m.  Hearing/Vision screen No results found. Immunizations and Health Maintenance Health Maintenance  Topic Date Due   COVID-19 Vaccine (6 - 2025-26 season) 12/26/2023   Medicare Annual Wellness (AWV)  04/02/2025   Bone Density Scan  07/17/2025   Colonoscopy  11/07/2025   DTaP/Tdap/Td (3 - Td or Tdap) 07/15/2033   Pneumococcal Vaccine: 50+ Years  Completed  Influenza Vaccine  Completed   Hepatitis C Screening  Completed   Zoster Vaccines- Shingrix   Completed   Meningococcal B Vaccine  Aged Out   Mammogram  Discontinued        Assessment/Plan:  This is a routine wellness examination for [facility].  Patient Care Team: Jolinda Norene HERO, DO as PCP - General (Family Medicine) Lavona Agent, MD as PCP - Cardiology (Cardiology) Cindie Carlin POUR, DO as Consulting Physician (Gastroenterology) Ebbie Cough, MD as Consulting Physician (General Surgery)  I have personally reviewed and noted the following in the patients chart:   Medical and social history Use of alcohol, tobacco or illicit drugs  Current medications and supplements including opioid prescriptions. Functional ability and status Nutritional status Physical activity Advanced directives List of other physicians Hospitalizations, surgeries, and ER visits in previous 12 months Vitals Screenings to include cognitive, depression, and falls Referrals and appointments  No orders of the defined types were placed in this encounter.  In addition, I have reviewed and discussed with patient certain preventive protocols, quality metrics, and best practice recommendations. A written personalized care plan for preventive services as well as general preventive health recommendations were provided to patient.   Stacey Reyes, CMA   04/02/2024   Return in 1 year (on 04/02/2025).  After  Visit Summary: (MyChart) Due to this being a telephonic visit, the after visit summary with patients personalized plan was offered to patient via MyChart   Nurse Notes: n/a "

## 2024-04-22 ENCOUNTER — Ambulatory Visit: Payer: Self-pay

## 2024-04-22 ENCOUNTER — Encounter: Payer: Self-pay | Admitting: Family Medicine

## 2024-04-22 ENCOUNTER — Ambulatory Visit: Admitting: Family Medicine

## 2024-04-22 VITALS — BP 133/6 | HR 72 | Temp 98.0°F | Ht 65.0 in | Wt 191.0 lb

## 2024-04-22 DIAGNOSIS — D500 Iron deficiency anemia secondary to blood loss (chronic): Secondary | ICD-10-CM | POA: Diagnosis not present

## 2024-04-22 DIAGNOSIS — R011 Cardiac murmur, unspecified: Secondary | ICD-10-CM | POA: Diagnosis not present

## 2024-04-22 DIAGNOSIS — R300 Dysuria: Secondary | ICD-10-CM

## 2024-04-22 LAB — URINALYSIS, ROUTINE W REFLEX MICROSCOPIC
Bilirubin, UA: NEGATIVE
Glucose, UA: NEGATIVE
Ketones, UA: NEGATIVE
Leukocytes,UA: NEGATIVE
Nitrite, UA: NEGATIVE
Protein,UA: NEGATIVE
RBC, UA: NEGATIVE
Urobilinogen, Ur: 0.2 mg/dL (ref 0.2–1.0)
pH, UA: 5.5 (ref 5.0–7.5)

## 2024-04-22 MED ORDER — FLUCONAZOLE 150 MG PO TABS: 150.0000 mg | ORAL_TABLET | Freq: Once | ORAL | 0 refills | Status: AC

## 2024-04-22 NOTE — Telephone Encounter (Signed)
 FYI Only or Action Required?: FYI only for provider: appointment scheduled on 1/29.  Patient was last seen in primary care on 01/20/2024 by Jolinda Norene HERO, DO.  Called Nurse Triage reporting Urinary Frequency.  Symptoms began yesterday.  Interventions attempted: OTC medications: Monistat 7.  Symptoms are: unchanged.  Triage Disposition: See HCP Within 4 Hours (Or PCP Triage)  Patient/caregiver understands and will follow disposition?: Yes      Message from Susanna ORN sent at 04/22/2024  8:08 AM EST  Reason for Triage: Patient states she feels a UTI starting. Her back is hurting & she has burning & itching. Also states she has a history of having UTIs. Wanting to be seen today.    Reason for Disposition  Side (flank) or lower back pain present  Answer Assessment - Initial Assessment Questions 1. SYMPTOM: What's the main symptom you're concerned about? (e.g., frequency, incontinence)     Flank pain, burning & itching  2. ONSET: When did the  symptoms  start?     Yesterday   3. PAIN: Is there any pain? If Yes, ask: How bad is it? (Scale: 1-10; mild, moderate, severe)     No pain   4. CAUSE: What do you think is causing the symptoms?     Possible UTI   5. OTHER SYMPTOMS: Do you have any other symptoms? (e.g., blood in urine, fever, flank pain, pain with urination)      Flank pain       Patient called in to triage with complaints of Flank pain , itching, burning with urination.  This has been ongoing since yesterday. The patient stated she suspects a UTI is forming.  For home care, the patient is taking Monistat 7.   Appointment scheduled for further evaluation; Patient agrees with the plan of care, and will reach out if symptoms worsen or persist.  Protocols used: Urinary Symptoms-A-AH

## 2024-04-22 NOTE — Progress Notes (Signed)
 "  Subjective:  Patient ID: Stacey Reyes, female    DOB: Jun 07, 1945  Age: 79 y.o. MRN: [id number]  CC: Urinary Tract Infection (Started yesterday. Lower back pain and itching  and burning. Not much urine when she does go. Taking monistat. ) and blood work (Would like hemoglobin checked due to hx of anemia. )   HPI  Discussed the use of AI scribe software for clinical note transcription with the patient, who gave verbal consent to proceed.  History of Present Illness Stacey Reyes is a 79 year old female who presents with urinary symptoms.  She began experiencing urinary symptoms yesterday, including dysuria and pruritus during urination, along with lower back pain. She is experiencing urinary frequency with small volumes. No abdominal pain, fever, or chills are present. The discomfort is primarily in the lower back and slightly higher up.  She has a history of similar urinary symptoms in the past, which she describes as 'misery', and is concerned about the symptoms spreading. She used an over-the-counter cream, miconazole, which provided some relief from the burning and itching.  She has a history of a heart murmur, which has not caused any chest pain or shortness of breath. She recalls experiencing some discomfort when lying down last night.  Additionally, she has a history of hemoglobin issues, having been hospitalized several times for this condition. She has been getting her hemoglobin levels checked regularly, although not recently. She had some 'little tabs' placed to address blood loss, which have been effective.  She shares that she has been under significant stress due to her husband's recent hip fracture, which requires her to provide 24-hour care.          04/22/2024    1:55 PM 04/02/2024   11:05 AM 01/20/2024    1:34 PM  Depression screen PHQ 2/9  Decreased Interest 0 0 0  Down, Depressed, Hopeless 1 0 1  PHQ - 2 Score 1 0 1  Altered sleeping 2  2  Tired,  decreased energy 1  0  Change in appetite 0  1  Feeling bad or failure about yourself  0  0  Trouble concentrating 0  0  Moving slowly or fidgety/restless 0  0  Suicidal thoughts 0  0  PHQ-9 Score 4  4   Difficult doing work/chores Not difficult at all  Not difficult at all     Data saved with a previous flowsheet row definition    History Stacey Reyes has a past medical history of Anemia, Asymmetric septal hypertrophy, Blood transfusion without reported diagnosis, Cataract, Chronic cystitis, Diverticulitis, GERD (gastroesophageal reflux disease), Heart murmur, Hyperlipidemia, Hypertension, and Personal history of colonic polyps-adenomas (01/07/2012).   She has a past surgical history that includes Colonoscopy (01/2020); Breast biopsy (Right); Esophagogastroduodenoscopy (egd) with propofol  (N/A, 11/23/2019); Givens capsule study (N/A, 11/23/2019); Givens capsule study (N/A, 10/22/2020); Esophagogastroduodenoscopy (egd) with propofol  (N/A, 10/20/2020); Esophagogastroduodenoscopy (egd) with propofol  (N/A, 12/05/2020); IR Angiogram Visceral Selective (12/05/2020); IR EMBO ART  VEN HEMORR LYMPH EXTRAV  INC GUIDE ROADMAPPING (12/05/2020); IR US  Guide Vasc Access Right (12/05/2020); IR Angiogram Selective Each Additional Vessel (12/05/2020); IR Angiogram Selective Each Additional Vessel (12/05/2020); IR Angiogram Selective Each Additional Vessel (12/05/2020); Esophagogastroduodenoscopy (egd) with propofol  (N/A, 12/11/2020); Hemostasis clip placement (12/11/2020); IR Angiogram Visceral Selective (12/13/2020); IR US  Guide Vasc Access Left (12/13/2020); IR Angiogram Selective Each Additional Vessel (12/13/2020); IR Angiogram Selective Each Additional Vessel (12/13/2020); IR EMBO ART  VEN HEMORR LYMPH EXTRAV  INC GUIDE ROADMAPPING (12/13/2020); IR Radiologist Eval &  Mgmt (01/11/2021); Esophagogastroduodenoscopy (egd) with propofol  (N/A, 09/17/2021); enteroscopy (N/A, 09/26/2021); Submucosal tattoo injection  (09/26/2021); Givens capsule study (N/A, 09/26/2021); Esophagogastroduodenoscopy (N/A, 10/07/2021); Esophagogastroduodenoscopy (egd) with propofol  (N/A, 12/19/2021); enteroscopy (N/A, 12/19/2021); Breast biopsy (Left, 05/10/2022); Breast biopsy (Left, 05/10/2022); Cataract extraction (Bilateral); Breast biopsy (08/20/2022); Breast biopsy (08/20/2022); Radioactive seed guided excisional breast biopsy (Left, 08/22/2022); Colonoscopy with propofol  (N/A, 11/08/2022); polypectomy (11/08/2022); Breast excisional biopsy (Left); and Breast excisional biopsy (Right).   Her family history includes Asthma in her brother; Breast cancer in her sister; Colon cancer (age of onset: 38) in her mother.She reports that she quit smoking about 39 years ago. Her smoking use included cigarettes. She has never used smokeless tobacco. She reports current alcohol use. She reports that she does not use drugs.    ROS Review of Systems  Constitutional:  Negative for chills, diaphoresis and fever.  HENT:  Negative for congestion.   Eyes:  Negative for visual disturbance.  Respiratory:  Negative for cough and shortness of breath.   Cardiovascular:  Negative for chest pain and palpitations.  Gastrointestinal:  Negative for constipation, diarrhea and nausea.  Genitourinary:  Positive for dysuria, frequency and urgency. Negative for decreased urine volume, flank pain, hematuria, menstrual problem and pelvic pain.  Musculoskeletal:  Negative for arthralgias and joint swelling.  Skin:  Negative for rash.  Neurological:  Negative for dizziness and numbness.    Objective:  BP (!) 133/6   Pulse 72   Temp 98 F (36.7 C)   Ht 5' 5 (1.651 m)   Wt 191 lb (86.6 kg)   SpO2 95%   BMI 31.78 kg/m   BP Readings from Last 3 Encounters:  04/22/24 (!) 133/6  04/02/24 113/68  01/20/24 113/68    Wt Readings from Last 3 Encounters:  04/22/24 191 lb (86.6 kg)  04/02/24 193 lb (87.5 kg)  01/20/24 193 lb 4 oz (87.7 kg)      Physical Exam Physical Exam GENERAL: Alert, cooperative, well developed, no acute distress. HEENT: Normocephalic, normal oropharynx, moist mucous membranes. CHEST: Clear to auscultation bilaterally, no wheezes, rhonchi, or crackles. CARDIOVASCULAR: Heart murmur auscultated, normal heart rate and rhythm, S1 and S2 normal. ABDOMEN: Soft, non-tender, non-distended, without organomegaly, normal bowel sounds, no tenderness over bladder. EXTREMITIES: No cyanosis or edema. NEUROLOGICAL: Cranial nerves grossly intact, moves all extremities without gross motor or sensory deficit.   Assessment & Plan:  Dysuria -     Urinalysis, Routine w reflex microscopic -     Urine Culture  Iron deficiency anemia due to chronic blood loss -     CBC with Differential/Platelet  Heart murmur -     CBC with Differential/Platelet  Other orders -     Fluconazole ; Take 1 tablet (150 mg total) by mouth once for 1 dose. At onset of symptoms. Repeat in 5 days  Dispense: 2 tablet; Refill: 0    Assessment and Plan Assessment & Plan Vulvovaginal candidiasis   Symptoms of burning and itching during urination, relieved by miconazole cream, indicate a yeast infection. Urinalysis was negative for bacteria, white blood cells, and nitrites, confirming vulvovaginal candidiasis. Prescribed Diflucan  150 mg once daily for 5 days.  Iron deficiency anemia due to chronic blood loss   Iron deficiency anemia is secondary to chronic blood loss. Previous interventions for blood loss have been effective, but no recent hemoglobin check has been done. Ordered a hemoglobin test.       Follow-up: Return if symptoms worsen or fail to improve.  Butler Der, M.D. "

## 2024-04-22 NOTE — Telephone Encounter (Signed)
Noted  -LS

## 2024-04-23 LAB — CBC WITH DIFFERENTIAL/PLATELET
Basophils Absolute: 0.1 10*3/uL (ref 0.0–0.2)
Basos: 1 %
EOS (ABSOLUTE): 0.2 10*3/uL (ref 0.0–0.4)
Eos: 3 %
Hematocrit: 41.6 % (ref 34.0–46.6)
Hemoglobin: 13.7 g/dL (ref 11.1–15.9)
Immature Grans (Abs): 0.1 10*3/uL (ref 0.0–0.1)
Immature Granulocytes: 1 %
Lymphocytes Absolute: 2.2 10*3/uL (ref 0.7–3.1)
Lymphs: 28 %
MCH: 30.4 pg (ref 26.6–33.0)
MCHC: 32.9 g/dL (ref 31.5–35.7)
MCV: 92 fL (ref 79–97)
Monocytes Absolute: 0.7 10*3/uL (ref 0.1–0.9)
Monocytes: 8 %
Neutrophils Absolute: 4.7 10*3/uL (ref 1.4–7.0)
Neutrophils: 59 %
Platelets: 216 10*3/uL (ref 150–450)
RBC: 4.5 x10E6/uL (ref 3.77–5.28)
RDW: 12.4 % (ref 11.7–15.4)
WBC: 7.9 10*3/uL (ref 3.4–10.8)

## 2024-04-24 LAB — URINE CULTURE

## 2024-04-26 ENCOUNTER — Ambulatory Visit: Payer: Self-pay | Admitting: Family Medicine

## 2024-04-26 ENCOUNTER — Encounter: Payer: Self-pay | Admitting: Family Medicine

## 2024-04-26 NOTE — Progress Notes (Signed)
Hello Cyndal,  Your lab result is normal and/or stable.Some minor variations that are not significant are commonly marked abnormal, but do not represent any medical problem for you.  Best regards,  , M.D.

## 2025-03-28 ENCOUNTER — Ambulatory Visit

## 2025-04-04 ENCOUNTER — Ambulatory Visit
